# Patient Record
Sex: Female | Born: 1940
Health system: Southern US, Community
[De-identification: ages and names within clinical notes are randomized; demographics above are authoritative.]

## PROBLEM LIST (undated history)

## (undated) DIAGNOSIS — M545 Low back pain, unspecified: Secondary | ICD-10-CM

## (undated) DIAGNOSIS — I2510 Atherosclerotic heart disease of native coronary artery without angina pectoris: Secondary | ICD-10-CM

## (undated) DIAGNOSIS — F329 Major depressive disorder, single episode, unspecified: Secondary | ICD-10-CM

## (undated) DIAGNOSIS — D509 Iron deficiency anemia, unspecified: Secondary | ICD-10-CM

## (undated) DIAGNOSIS — M1990 Unspecified osteoarthritis, unspecified site: Secondary | ICD-10-CM

## (undated) DIAGNOSIS — G8929 Other chronic pain: Secondary | ICD-10-CM

## (undated) DIAGNOSIS — M8580 Other specified disorders of bone density and structure, unspecified site: Secondary | ICD-10-CM

## (undated) DIAGNOSIS — L719 Rosacea, unspecified: Secondary | ICD-10-CM

## (undated) DIAGNOSIS — I4891 Unspecified atrial fibrillation: Secondary | ICD-10-CM

## (undated) DIAGNOSIS — Z9889 Other specified postprocedural states: Secondary | ICD-10-CM

## (undated) DIAGNOSIS — E669 Obesity, unspecified: Secondary | ICD-10-CM

## (undated) DIAGNOSIS — K219 Gastro-esophageal reflux disease without esophagitis: Secondary | ICD-10-CM

## (undated) DIAGNOSIS — E119 Type 2 diabetes mellitus without complications: Secondary | ICD-10-CM

## (undated) DIAGNOSIS — I214 Non-ST elevation (NSTEMI) myocardial infarction: Secondary | ICD-10-CM

## (undated) DIAGNOSIS — K760 Fatty (change of) liver, not elsewhere classified: Secondary | ICD-10-CM

## (undated) DIAGNOSIS — I509 Heart failure, unspecified: Secondary | ICD-10-CM

## (undated) DIAGNOSIS — F32A Depression, unspecified: Secondary | ICD-10-CM

## (undated) DIAGNOSIS — I428 Other cardiomyopathies: Secondary | ICD-10-CM

## (undated) DIAGNOSIS — I10 Essential (primary) hypertension: Secondary | ICD-10-CM

## (undated) DIAGNOSIS — I340 Nonrheumatic mitral (valve) insufficiency: Secondary | ICD-10-CM

## (undated) DIAGNOSIS — I5022 Chronic systolic (congestive) heart failure: Secondary | ICD-10-CM

## (undated) DIAGNOSIS — R112 Nausea with vomiting, unspecified: Secondary | ICD-10-CM

## (undated) HISTORY — DX: Chronic systolic (congestive) heart failure: I50.22

## (undated) HISTORY — PX: CHOLECYSTECTOMY: SHX55

## (undated) HISTORY — PX: BREAST BIOPSY: SHX20

## (undated) HISTORY — DX: Obesity, unspecified: E66.9

## (undated) HISTORY — DX: Low back pain: M54.5

## (undated) HISTORY — PX: TOTAL ABDOMINAL HYSTERECTOMY W/ BILATERAL SALPINGOOPHORECTOMY: SHX83

## (undated) HISTORY — DX: Depression, unspecified: F32.A

## (undated) HISTORY — DX: Major depressive disorder, single episode, unspecified: F32.9

## (undated) HISTORY — DX: Nonrheumatic mitral (valve) insufficiency: I34.0

## (undated) HISTORY — PX: ABDOMINAL HYSTERECTOMY: SHX81

## (undated) HISTORY — PX: CATARACT EXTRACTION: SUR2

## (undated) HISTORY — DX: Atherosclerotic heart disease of native coronary artery without angina pectoris: I25.10

## (undated) HISTORY — DX: Other chronic pain: G89.29

## (undated) HISTORY — DX: Rosacea, unspecified: L71.9

## (undated) HISTORY — DX: Type 2 diabetes mellitus without complications: E11.9

## (undated) HISTORY — DX: Low back pain, unspecified: M54.50

## (undated) HISTORY — DX: Gastro-esophageal reflux disease without esophagitis: K21.9

## (undated) HISTORY — DX: Fatty (change of) liver, not elsewhere classified: K76.0

## (undated) HISTORY — PX: ROTATOR CUFF REPAIR: SHX139

## (undated) HISTORY — DX: Other specified disorders of bone density and structure, unspecified site: M85.80

## (undated) HISTORY — PX: TONSILLECTOMY: SUR1361

## (undated) HISTORY — DX: Unspecified atrial fibrillation: I48.91

## (undated) HISTORY — DX: Essential (primary) hypertension: I10

## (undated) HISTORY — PX: APPENDECTOMY: SHX54

---

## 2008-04-12 HISTORY — PX: CARDIAC CATHETERIZATION: SHX172

## 2008-06-06 ENCOUNTER — Ambulatory Visit: Payer: Self-pay | Admitting: Cardiology

## 2008-06-10 ENCOUNTER — Inpatient Hospital Stay (HOSPITAL_COMMUNITY): Admission: AD | Admit: 2008-06-10 | Discharge: 2008-06-18 | Payer: Self-pay | Admitting: Cardiology

## 2008-06-10 ENCOUNTER — Ambulatory Visit: Payer: Self-pay | Admitting: Cardiovascular Disease

## 2008-06-11 ENCOUNTER — Encounter: Payer: Self-pay | Admitting: Cardiology

## 2008-06-13 ENCOUNTER — Encounter: Payer: Self-pay | Admitting: Cardiology

## 2008-06-14 ENCOUNTER — Encounter: Payer: Self-pay | Admitting: Internal Medicine

## 2008-06-19 ENCOUNTER — Ambulatory Visit: Payer: Self-pay | Admitting: Cardiology

## 2008-06-25 ENCOUNTER — Encounter: Payer: Self-pay | Admitting: Cardiology

## 2008-06-26 ENCOUNTER — Encounter: Payer: Self-pay | Admitting: Cardiology

## 2008-06-28 ENCOUNTER — Encounter: Payer: Self-pay | Admitting: Cardiology

## 2008-07-04 ENCOUNTER — Ambulatory Visit: Payer: Self-pay | Admitting: Cardiology

## 2008-07-11 ENCOUNTER — Ambulatory Visit: Payer: Self-pay | Admitting: Cardiology

## 2008-07-11 ENCOUNTER — Encounter: Payer: Self-pay | Admitting: Cardiology

## 2008-07-19 ENCOUNTER — Ambulatory Visit: Payer: Self-pay | Admitting: Cardiology

## 2008-07-26 ENCOUNTER — Ambulatory Visit: Payer: Self-pay | Admitting: Cardiology

## 2008-08-01 ENCOUNTER — Ambulatory Visit: Payer: Self-pay | Admitting: Cardiology

## 2008-08-05 ENCOUNTER — Ambulatory Visit: Payer: Self-pay | Admitting: Cardiology

## 2008-08-08 ENCOUNTER — Encounter: Payer: Self-pay | Admitting: Cardiology

## 2008-08-13 ENCOUNTER — Ambulatory Visit: Payer: Self-pay | Admitting: Cardiology

## 2008-08-27 ENCOUNTER — Ambulatory Visit: Payer: Self-pay | Admitting: Cardiology

## 2008-09-16 ENCOUNTER — Encounter: Payer: Self-pay | Admitting: Cardiology

## 2008-09-20 ENCOUNTER — Ambulatory Visit: Payer: Self-pay | Admitting: Cardiology

## 2008-09-23 ENCOUNTER — Encounter: Payer: Self-pay | Admitting: Cardiology

## 2008-09-27 ENCOUNTER — Encounter: Payer: Self-pay | Admitting: Cardiology

## 2008-11-25 ENCOUNTER — Encounter: Payer: Self-pay | Admitting: *Deleted

## 2008-11-29 ENCOUNTER — Ambulatory Visit: Payer: Self-pay | Admitting: Cardiology

## 2008-12-10 ENCOUNTER — Ambulatory Visit: Payer: Self-pay | Admitting: Cardiology

## 2008-12-18 ENCOUNTER — Encounter: Payer: Self-pay | Admitting: Cardiology

## 2009-01-10 ENCOUNTER — Ambulatory Visit: Payer: Self-pay | Admitting: Cardiology

## 2009-01-10 LAB — CONVERTED CEMR LAB: POC INR: 1.4

## 2009-01-24 ENCOUNTER — Ambulatory Visit: Payer: Self-pay | Admitting: Cardiology

## 2009-01-24 LAB — CONVERTED CEMR LAB: POC INR: 2.8

## 2009-02-14 ENCOUNTER — Ambulatory Visit: Payer: Self-pay | Admitting: Cardiology

## 2009-02-14 LAB — CONVERTED CEMR LAB: POC INR: 2.7

## 2009-02-26 ENCOUNTER — Telehealth (INDEPENDENT_AMBULATORY_CARE_PROVIDER_SITE_OTHER): Payer: Self-pay | Admitting: *Deleted

## 2009-03-27 ENCOUNTER — Encounter (INDEPENDENT_AMBULATORY_CARE_PROVIDER_SITE_OTHER): Payer: Self-pay | Admitting: Cardiology

## 2009-04-18 ENCOUNTER — Ambulatory Visit: Payer: Self-pay | Admitting: Cardiology

## 2009-04-18 LAB — CONVERTED CEMR LAB: POC INR: 2.9

## 2009-05-09 ENCOUNTER — Ambulatory Visit: Payer: Self-pay | Admitting: Cardiology

## 2009-05-09 LAB — CONVERTED CEMR LAB: POC INR: 2.1

## 2009-06-06 ENCOUNTER — Encounter: Payer: Self-pay | Admitting: Cardiology

## 2009-06-06 DIAGNOSIS — I509 Heart failure, unspecified: Secondary | ICD-10-CM | POA: Insufficient documentation

## 2009-06-06 DIAGNOSIS — I200 Unstable angina: Secondary | ICD-10-CM

## 2009-06-06 DIAGNOSIS — E876 Hypokalemia: Secondary | ICD-10-CM | POA: Insufficient documentation

## 2009-06-06 DIAGNOSIS — R079 Chest pain, unspecified: Secondary | ICD-10-CM | POA: Insufficient documentation

## 2009-06-06 DIAGNOSIS — K219 Gastro-esophageal reflux disease without esophagitis: Secondary | ICD-10-CM

## 2009-06-06 DIAGNOSIS — I5022 Chronic systolic (congestive) heart failure: Secondary | ICD-10-CM

## 2009-06-06 DIAGNOSIS — I2510 Atherosclerotic heart disease of native coronary artery without angina pectoris: Secondary | ICD-10-CM

## 2009-06-06 DIAGNOSIS — E871 Hypo-osmolality and hyponatremia: Secondary | ICD-10-CM

## 2009-06-06 DIAGNOSIS — E785 Hyperlipidemia, unspecified: Secondary | ICD-10-CM | POA: Insufficient documentation

## 2009-06-06 DIAGNOSIS — E1165 Type 2 diabetes mellitus with hyperglycemia: Secondary | ICD-10-CM

## 2009-06-06 DIAGNOSIS — I10 Essential (primary) hypertension: Secondary | ICD-10-CM | POA: Insufficient documentation

## 2009-06-06 DIAGNOSIS — E1169 Type 2 diabetes mellitus with other specified complication: Secondary | ICD-10-CM

## 2009-06-06 DIAGNOSIS — G8929 Other chronic pain: Secondary | ICD-10-CM

## 2009-07-04 ENCOUNTER — Ambulatory Visit: Payer: Self-pay | Admitting: Cardiology

## 2009-08-07 ENCOUNTER — Encounter (INDEPENDENT_AMBULATORY_CARE_PROVIDER_SITE_OTHER): Payer: Self-pay | Admitting: Pharmacist

## 2009-08-11 ENCOUNTER — Encounter: Payer: Self-pay | Admitting: Cardiology

## 2009-09-03 ENCOUNTER — Encounter (INDEPENDENT_AMBULATORY_CARE_PROVIDER_SITE_OTHER): Payer: Self-pay | Admitting: Pharmacist

## 2009-09-22 ENCOUNTER — Telehealth (INDEPENDENT_AMBULATORY_CARE_PROVIDER_SITE_OTHER): Payer: Self-pay | Admitting: *Deleted

## 2009-09-23 ENCOUNTER — Ambulatory Visit: Payer: Self-pay | Admitting: Cardiology

## 2009-10-14 ENCOUNTER — Ambulatory Visit: Payer: Self-pay | Admitting: Cardiology

## 2009-10-28 ENCOUNTER — Ambulatory Visit: Payer: Self-pay | Admitting: Cardiology

## 2009-10-28 DIAGNOSIS — I2510 Atherosclerotic heart disease of native coronary artery without angina pectoris: Secondary | ICD-10-CM | POA: Insufficient documentation

## 2009-10-28 DIAGNOSIS — I428 Other cardiomyopathies: Secondary | ICD-10-CM

## 2009-10-28 DIAGNOSIS — I4891 Unspecified atrial fibrillation: Secondary | ICD-10-CM | POA: Insufficient documentation

## 2009-10-28 DIAGNOSIS — I447 Left bundle-branch block, unspecified: Secondary | ICD-10-CM | POA: Insufficient documentation

## 2009-11-12 ENCOUNTER — Ambulatory Visit: Payer: Self-pay | Admitting: Cardiology

## 2009-11-20 ENCOUNTER — Encounter (INDEPENDENT_AMBULATORY_CARE_PROVIDER_SITE_OTHER): Payer: Self-pay | Admitting: *Deleted

## 2009-11-26 ENCOUNTER — Encounter (INDEPENDENT_AMBULATORY_CARE_PROVIDER_SITE_OTHER): Payer: Self-pay | Admitting: Pharmacist

## 2009-12-24 ENCOUNTER — Encounter (INDEPENDENT_AMBULATORY_CARE_PROVIDER_SITE_OTHER): Payer: Self-pay | Admitting: Pharmacist

## 2010-01-06 ENCOUNTER — Ambulatory Visit: Payer: Self-pay | Admitting: Cardiology

## 2010-02-03 ENCOUNTER — Ambulatory Visit: Payer: Self-pay | Admitting: Cardiology

## 2010-03-25 ENCOUNTER — Telehealth (INDEPENDENT_AMBULATORY_CARE_PROVIDER_SITE_OTHER): Payer: Self-pay | Admitting: *Deleted

## 2010-03-27 ENCOUNTER — Ambulatory Visit: Payer: Self-pay | Admitting: Cardiology

## 2010-03-27 LAB — CONVERTED CEMR LAB: POC INR: 1.8

## 2010-04-21 ENCOUNTER — Ambulatory Visit: Admission: RE | Admit: 2010-04-21 | Discharge: 2010-04-21 | Payer: Self-pay | Source: Home / Self Care

## 2010-04-21 LAB — CONVERTED CEMR LAB: POC INR: 2

## 2010-05-12 NOTE — Assessment & Plan Note (Signed)
Summary: f/u, one year pass due LA   Visit Type:  Follow-up Primary Provider:  Dr. Donzetta Sprung   History of Present Illness: the patient is a 70 year old female with history of IBS and depression. She has a history of nonischemic cardiomyopathy. Ejection fraction a year ago was 30-35%. She also has a history of permanent atrial fibrillation and had a prior unsuccessful cardioversion. She also has a history of left atrial thrombus by TEE and remains on Coumadin. She is functional class NYHA 2. Her depression is much improved. She does not report any heart failure symptoms. She has some lower showed edema towards the end of the day. She feels that her exercise tolerance has remained stable. Of note is that the patient has a chronic left bundle branch block with a QRS width of 156 ms.  Preventive Screening-Counseling & Management  Alcohol-Tobacco     Smoking Status: never  Current Medications (verified): 1)  Carvedilol 25 Mg Tabs (Carvedilol) .... Take 1 1/2 Tabs (37.5mg ) Twice A Day 2)  Furosemide 40 Mg Tabs (Furosemide) .... Take One Tablet By Mouth Three Times A Day 3)  Nitrostat 0.4 Mg Subl (Nitroglycerin) .... Dissolve One Tablet Under Tongue As Needed For Severe Chest Pain Every 5 Minutes, Not To Exceed 3 in 15 Min Time Frame 4)  Warfarin Sodium 5 Mg Tabs (Warfarin Sodium) .... Use As Directed By Anticoagulation Clinic 5)  Digoxin 0.125 Mg Tabs (Digoxin) .... Take 1 Tablet By Mouth Once A Day 6)  Lisinopril 10 Mg Tabs (Lisinopril) .... Take 1 Tablet By Mouth Two Times A Day 7)  Effexor Xr 150 Mg Xr24h-Cap (Venlafaxine Hcl) .... Take 1 Capsule By Mouth Once A Day 8)  Protonix 20 Mg Tbec (Pantoprazole Sodium) .... Take 1 Tablet By Mouth Once A Day 9)  Calcium Carbonate 600 Mg Tabs (Calcium Carbonate) .... Take 1 Tablet By Mouth Two Times A Day 10)  Lantus 100 Unit/ml Soln (Insulin Glargine) .... Use As Directed 11)  Crestor 10 Mg Tabs (Rosuvastatin Calcium) .... Take 1 Tab By Mouth  At Bedtime 12)  Doxycycline Hyclate 100 Mg Tabs (Doxycycline Hyclate) .... As Needed 13)  Aspirin 81 Mg Tbec (Aspirin) .... Take One Tablet By Mouth Daily  Allergies: 1)  ! Codeine  Comments:  Nurse/Medical Assistant: The patient's medications were reviewed with the patient and were updated in the Medication List. Pt brought a list of medications to office visit.  Cyril Loosen, RN, BSN (October 28, 2009 4:14 PM)  Past History:  Past Surgical History: Last updated: 2009-07-01 Abdominal Hysterectomy-Total and bilateral salpingo oophorectomy for a dermoid tumor Appendectomy Cataract Extraction Cholecystectomy Tonsillectomy Rotator cuff repair rt. shoulder Rt. 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 Vit D Insufficiency Non alcoholic fatty liver disease Diabetes Type 2 allerigic Rhinitis Hypertension Chronic Systolic Heart Failure GERD Nonobstructive CAD Osteopenia Depression Rosacea  Social History: Smoking Status:  never  Vital Signs:  Patient profile:   70 year old female Height:      67 inches Weight:      252.50 pounds BMI:     39.69 Pulse rate:  79 / minute BP sitting:   138 / 71  (left arm) Cuff size:   large  Vitals Entered By: Cyril Loosen, RN, BSN (October 28, 2009 4:04 PM)  Nutrition Counseling: Patient's BMI is greater than 25 and therefore counseled on weight management options. Comments follow up visit   Physical Exam  Additional Exam:  General: Well-developed, well-nourished in no distress head:  Normocephalic and atraumatic eyes PERRLA/EOMI intact, conjunctiva and lids normal nose: No deformity or lesions mouth normal dentition, normal posterior pharynx neck: Supple, no JVD.  No masses, thyromegaly or abnormal cervical nodes lungs: Normal breath sounds bilaterally without wheezing.  Normal percussion heart:Irregular rate and rhythm with normal S1 and S2, no S3 or S4.  PMI is normal.  No pathological murmurs abdomen: Normal bowel sounds, abdomen is soft and nontender without masses, organomegaly or hernias noted.  No hepatosplenomegaly musculoskeletal: Back normal, normal gait muscle strength and tone normal pulsus: Pulse is normal in all 4 extremities Extremities: No peripheral pitting edema neurologic: Alert and oriented x 3 skin: Intact without lesions or rashes cervical nodes: No significant adenopathy psychologic: Normal affect    EKG  Procedure date:  10/28/2009  Findings:      atrial fibrillation.left bundle branch block. Heart rate 71 beats per minute.  Impression & Recommendations:  Problem # 1:  ATRIAL FIBRILLATION (ICD-427.31) the patient will continue with the strategy of rate control. She is relatively asymptomatic from perspective of atrial fibrillation. She does have some fatigue and weakness and shortness of breath on moderate exertion but I suspect this is related to her LV dysfunction. Her updated medication list for this problem includes:    Carvedilol 25 Mg Tabs (Carvedilol) .Marland Kitchen... Take 1 1/2 tabs (37.5mg ) twice a day    Warfarin Sodium 5 Mg Tabs (Warfarin sodium) ..... Use as directed by anticoagulation clinic    Digoxin 0.125 Mg Tabs (Digoxin) .Marland Kitchen... Take 1 tablet by mouth once a day    Aspirin 81 Mg Tbec (Aspirin) .Marland Kitchen... Take one tablet by mouth daily  Orders: 2-D Echocardiogram (2D Echo)  Problem # 2:  LEFT VENTRICULAR FUNCTION, DECREASED (ICD-429.2) repeat echocardiogram and consider CRT-D ejection fraction less than 35%. The patient has a QRS of  156 ms unfortunately she is in atrial fibrillation which may make the predictive value of CRT success smaller.  Problem # 3:  LEFT BUNDLE BRANCH BLOCK (ICD-426.3) Assessment: Comment Only  The following medications were removed from the medication list:    Lovenox 120 Mg/0.32ml Soln (Enoxaparin sodium) ..... Inject 1 syringe subcutaneously once a day Her updated medication list for this problem includes:    Carvedilol 25 Mg Tabs (Carvedilol) .Marland Kitchen... Take 1 1/2 tabs (37.5mg ) twice a day    Nitrostat 0.4 Mg Subl (Nitroglycerin) .Marland Kitchen... Dissolve one tablet under tongue as needed for severe chest pain every 5 minutes, not to exceed 3 in 15 min time frame    Warfarin Sodium 5 Mg Tabs (Warfarin sodium) ..... Use as directed by anticoagulation clinic    Lisinopril 10 Mg Tabs (Lisinopril) .Marland Kitchen... Take 1 tablet by mouth two times a day    Aspirin 81 Mg Tbec (Aspirin) .Marland Kitchen... Take one tablet by mouth daily  Problem # 4:  CARDIOMYOPATHY, DILATED (ICD-425.4) Assessment: Comment Only  The following medications were removed from the medication list:    Lovenox 120 Mg/0.86ml Soln (Enoxaparin sodium) ..... Inject 1 syringe subcutaneously once a day Her updated medication list for this problem includes:    Carvedilol 25 Mg Tabs (Carvedilol) .Marland KitchenMarland KitchenMarland KitchenMarland Kitchen  Take 1 1/2 tabs (37.5mg ) twice a day    Furosemide 40 Mg Tabs (Furosemide) .Marland Kitchen... Take one tablet by mouth three times a day    Nitrostat 0.4 Mg Subl (Nitroglycerin) .Marland Kitchen... Dissolve one tablet under tongue as needed for severe chest pain every 5 minutes, not to exceed 3 in 15 min time frame    Warfarin Sodium 5 Mg Tabs (Warfarin sodium) ..... Use as directed by anticoagulation clinic    Digoxin 0.125 Mg Tabs (Digoxin) .Marland Kitchen... Take 1 tablet by mouth once a day    Lisinopril 10 Mg Tabs (Lisinopril) .Marland Kitchen... Take 1 tablet by mouth two times a day    Aspirin 81 Mg Tbec (Aspirin) .Marland Kitchen... Take one tablet by mouth daily  Other Orders: EKG w/ Interpretation (93000)  Patient  Instructions: 1)  2D Echo  2)  Follow up in  6 months

## 2010-05-12 NOTE — Letter (Signed)
Summary: Custom - Delinquent Coumadin 2  [facility]  [address] [place] [address]   Phone: [phone]  Fax: [phone]     December 24, 2009 MRN: [id number]   Victoria Adams [address] [place], [place]  [address]   Dear Ms. Alexopoulos,  We have attempted to contact you by phone and letter on multiple occasions to contact our office for important blood work associated with the blood thinner, warfarin (Coumadin).  Warfarin is a very important drug that can cause life threatening side effects including, bleeding, and thus requires close laboratory monitoring.  We are unable to accept responsibility for blood thinner-related health problems you may develop because you have not followed our recommendations for appropriate monitoring.  These may include abnormal bleeding occurrences and/or development of blood clots (stroke, heart attack, blood clots in legs or lungs, etc.).  We need for you to contact this office at the number listed above to schedule and complete this very important blood work.  Thank you for your assistance in this urgent matter.  Sincerely, Vashti Hey RN [facility] Team

## 2010-05-12 NOTE — Letter (Signed)
Summary: Custom - Delinquent Coumadin 1  [facility]  [address]   [place], [place] [address]   Phone: [phone]  Fax: [phone]     November 26, 2009 MRN: [id number]   Victoria Adams [address] [place], [place]  [address]   Dear Ms. Lehenbauer,  This letter is being sent to you as a reminder that it is necessary for you to get your INR/PT checked regularly so that we can optimize your care.  Our records indicate that you were scheduled to have a test done recently.  As of today, we have not received the results of this test.  It is very important that you have your INR checked.  Please call our office at the number listed above to schedule an appointment at your earliest convenience.    If you have recently had your protime checked or have discontinued this medication, please contact our office at the above phone number to clarify this issue.  Thank you for this prompt attention to this important health care matter.  Sincerely, Nikhita Mentzel Reid RN  [facility] Team   

## 2010-05-12 NOTE — Medication Information (Signed)
Summary: ccr-lr  Anticoagulant Therapy  Managed by: Vashti Hey, RN Supervising MD: Myrtis Ser MD, Tinnie Gens Indication 1: Atrial Fibrillation (ICD-427.31) [facility] Site: [place] INR POC 2.1  Dietary changes: no    Health status changes: no    Bleeding/hemorrhagic complications: no    Recent/future hospitalizations: no    Any changes in medication regimen? no    Recent/future dental: no  Any missed doses?: no       Is patient compliant with meds? yes       Anticoagulation Management History:      The patient is taking warfarin and comes in today for a routine follow up visit.  Positive risk factors for bleeding include an age of 68 years or older.  The bleeding index is 'intermediate risk'.  Negative CHADS2 values include Age > 35 years old.  The start date was 06/16/2008.  Anticoagulation responsible provider: Myrtis Ser MD, Tinnie Gens.  INR POC: 2.1.  Cuvette Lot#: [id number].  Exp: 10/11.    Anticoagulation Management Assessment/Plan:      The patient's current anticoagulation dose is Warfarin sodium 5 mg tabs: Use as directed by Anticoagulation Clinic.  The target INR is 2 - 3.  The next INR is due 06/06/2009.  Anticoagulation instructions were given to patient.  Results were reviewed/authorized by Vashti Hey, RN.  She was notified by Vashti Hey RN.         Prior Anticoagulation Instructions: INR 2.9 Continue coumadin 5mg  once daily except 7.5mg  on Sundays and Thursdays Doxycycline increased to 2 x day  Current Anticoagulation Instructions: INR 2.1 Continue coumadin 5mg  once daily except 7.5mg  on Sundays and Thursdays

## 2010-05-12 NOTE — Medication Information (Signed)
Summary: missed last appt/LA  Anticoagulant Therapy  Managed by: Vashti Hey, RN Supervising MD: Andee Lineman MD, Michelle Piper Indication 1: Atrial Fibrillation (ICD-427.31) Lab [facility]: [facility] Site: [place] INR POC 1.8  Dietary changes: no    Health status changes: no    Bleeding/hemorrhagic complications: no    Recent/future hospitalizations: no    Any changes in medication regimen? no    Recent/future dental: no  Any missed doses?: no       Is patient compliant with meds? yes       Allergies: 1)  ! Codeine  Anticoagulation Management History:      The patient is taking warfarin and comes in today for a routine follow up visit.  Positive risk factors for bleeding include an age of 41 years or older and presence of serious comorbidities.  The bleeding index is 'intermediate risk'.  Positive CHADS2 values include History of CHF, History of HTN, and History of Diabetes.  Negative CHADS2 values include Age > 3 years old.  The start date was 06/16/2008.  Anticoagulation responsible provider: Andee Lineman MD, Michelle Piper.  INR POC: 1.8.  Cuvette Lot#: [id number].  Exp: 10/11.    Anticoagulation Management Assessment/Plan:      The patient's current anticoagulation dose is Warfarin sodium 5 mg tabs: Use as directed by Anticoagulation Clinic.  The target INR is 2 - 3.  The next INR is due 07/25/2009.  Anticoagulation instructions were given to patient.  Results were reviewed/authorized by Vashti Hey, RN.  She was notified by Vashti Hey RN.         Prior Anticoagulation Instructions: INR 2.1 Continue coumadin 5mg  once daily except 7.5mg  on Sundays and Thursdays  Current Anticoagulation Instructions: INR 1.8 Take coumadin 10mg  tonight then increase dose to 5mg  once daily except 7.5mg  on S,T,Th

## 2010-05-12 NOTE — Letter (Signed)
Summary: [facility], [facility] at [facility]  [address]   [place], [place] [address]   Phone: [phone]  Fax: [phone]        November 20, 2009 MRN: [id number]   Victoria Adams [address] [place], [place]  [address]   Dear Ms. Shepard,  Your test ordered by Selena Batten has been reviewed by your physician (or physician assistant) and was found to be normal or stable. Your physician (or physician assistant) felt no changes were needed at this time.  __X__ Echocardiogram  ____ Cardiac Stress Test  ____ Lab Work  ____ Peripheral vascular study of arms, legs or neck  ____ CT scan or X-ray  ____ Lung or Breathing test  ____ Other:   Thank you.   Hoover Brunette, LPN    Duane Boston, M.D., F.A.C.C. Thressa Sheller, M.D., F.A.C.C. Oneal Grout, M.D., F.A.C.C. Cheree Ditto, M.D., F.A.C.C. Daiva Nakayama, M.D., F.A.C.C. Kenney Houseman, M.D., F.A.C.C. Jeanne Ivan, PA-C

## 2010-05-12 NOTE — Progress Notes (Signed)
Summary: DELINQUENT APPT NOTIFICATION  Phone Note Outgoing Call Call back at [facility] Phone [phone]   Call placed by: Carlye Grippe,  September 22, 2009 3:53 PM Call placed to: Patient Summary of Call: called patient informing her that she was past due for f/u and she needed to keep the scheduled appt with Degent this time since she no showed for last appt, in order to keep getting refills on prescriptions. Initial call taken by: Carlye Grippe,  September 22, 2009 3:55 PM

## 2010-05-12 NOTE — Letter (Signed)
Summary: Appointment -missed   [facility] [address]   [place], [place] [address]   Phone: [phone]  Fax: [phone]     Aug 11, 2009 MRN: [id number]     Victoria Adams [address] [place], [place]  [address]      Dear Ms. Hannum,  Our records indicate you missed your appointment on Aug 11, 2009                        with Dr.  Andee Lineman.   It is very important that we reach you to reschedule this appointment. We look forward to participating in your health care needs.   Please contact us at the number listed above at your earliest convenience to reschedule this appointment.   Sincerely,    Glass blower/designer

## 2010-05-12 NOTE — Letter (Signed)
Summary: Custom - Delinquent Coumadin 1  [facility]  [address], [place] [address]   Phone: [phone]  Fax: [phone]     November 26, 2009 MRN: [id number]   Victoria Adams [address] [place], [place]  [address]   Dear Ms. Smet,  This letter is being sent to you as a reminder that it is necessary for you to get your INR/PT checked regularly so that we can optimize your care.  Our records indicate that you were scheduled to have a test done recently.  As of today, we have not received the results of this test.  It is very important that you have your INR checked.  Please call our office at the number listed above to schedule an appointment at your earliest convenience.    If you have recently had your protime checked or have discontinued this medication, please contact our office at the above phone number to clarify this issue.  Thank you for this prompt attention to this important health care matter.  Sincerely, Vashti Hey RN  [facility] Team

## 2010-05-12 NOTE — Medication Information (Signed)
Summary: CCR  Anticoagulant Therapy  Managed by: Vashti Hey, RN PCP: Dr. Donzetta Sprung Supervising MD: Andee Lineman MD, Michelle Piper Indication 1: Atrial Fibrillation (ICD-427.31) Lab [facility]: [facility] Site: [place] INR POC 2.1  Dietary changes: no    Health status changes: no    Bleeding/hemorrhagic complications: no    Recent/future hospitalizations: no    Any changes in medication regimen? no    Recent/future dental: no  Any missed doses?: no       Is patient compliant with meds? yes       Allergies: 1)  ! Codeine  Anticoagulation Management History:      The patient is taking warfarin and comes in today for a routine follow up visit.  Positive risk factors for bleeding include an age of 70 years or older and presence of serious comorbidities.  The bleeding index is 'intermediate risk'.  Positive CHADS2 values include History of CHF, History of HTN, and History of Diabetes.  Negative CHADS2 values include Age > 20 years old.  The start date was 06/16/2008.  Anticoagulation responsible provider: Andee Lineman MD, Michelle Piper.  INR POC: 2.1.  Cuvette Lot#: [id number].  Exp: 10/11.    Anticoagulation Management Assessment/Plan:      The patient's current anticoagulation dose is Warfarin sodium 5 mg tabs: Use as directed by Anticoagulation Clinic.  The target INR is 2 - 3.  The next INR is due 02/03/2010.  Anticoagulation instructions were given to patient.  Results were reviewed/authorized by Vashti Hey, RN.  She was notified by Vashti Hey RN.         Prior Anticoagulation Instructions: INR 2.1 Continue coumadin 5mg  once daily except 7.5mg  on Sundays and Thursdays  Current Anticoagulation Instructions: Same as Prior Instructions.

## 2010-05-12 NOTE — Medication Information (Signed)
Summary: ccr  Anticoagulant Therapy  Managed by: Vashti Hey, RN Supervising MD: Andee Lineman MD, Michelle Piper Indication 1: Atrial Fibrillation (ICD-427.31) Lab [facility]: [facility] Site: [place] INR POC 4.2  Dietary changes: no    Health status changes: no    Bleeding/hemorrhagic complications: no    Recent/future hospitalizations: no    Any changes in medication regimen? no    Recent/future dental: no  Any missed doses?: no       Is patient compliant with meds? yes       Allergies: 1)  ! Codeine  Anticoagulation Management History:      The patient is taking warfarin and comes in today for a routine follow up visit.  Positive risk factors for bleeding include an age of 70 years or older and presence of serious comorbidities.  The bleeding index is 'intermediate risk'.  Positive CHADS2 values include History of CHF, History of HTN, and History of Diabetes.  Negative CHADS2 values include Age > 4 years old.  The start date was 06/16/2008.  Anticoagulation responsible provider: Andee Lineman MD, Michelle Piper.  INR POC: 4.2.  Cuvette Lot#: [id number].  Exp: 10/11.    Anticoagulation Management Assessment/Plan:      The patient's current anticoagulation dose is Warfarin sodium 5 mg tabs: Use as directed by Anticoagulation Clinic.  The target INR is 2 - 3.  The next INR is due 10/14/2009.  Anticoagulation instructions were given to patient.  Results were reviewed/authorized by Vashti Hey, RN.  She was notified by Vashti Hey RN.         Prior Anticoagulation Instructions: INR 1.8 Take coumadin 10mg  tonight then increase dose to 5mg  once daily except 7.5mg  on S,T,Th  Current Anticoagulation Instructions: INR 4.2 Hold coumadin tonight then decrease dose to 5mg  once daily except 7.5mg  on Sundays and Thursdays

## 2010-05-12 NOTE — Medication Information (Signed)
Summary: ccr-lr  Anticoagulant Therapy  Managed by: Vashti Hey, RN Supervising MD: Antoine Poche MD, Fayrene Fearing Indication 1: Atrial Fibrillation (ICD-427.31) Lab [facility]: [facility] Site: [place] INR POC 2.1  Dietary changes: no    Health status changes: no    Bleeding/hemorrhagic complications: no    Recent/future hospitalizations: no    Any changes in medication regimen? no    Recent/future dental: no  Any missed doses?: no       Is patient compliant with meds? yes       Allergies: 1)  ! Codeine  Anticoagulation Management History:      The patient is taking warfarin and comes in today for a routine follow up visit.  Positive risk factors for bleeding include an age of 70 years or older and presence of serious comorbidities.  The bleeding index is 'intermediate risk'.  Positive CHADS2 values include History of CHF, History of HTN, and History of Diabetes.  Negative CHADS2 values include Age > 13 years old.  The start date was 06/16/2008.  Anticoagulation responsible provider: Antoine Poche MD, Fayrene Fearing.  INR POC: 2.1.  Cuvette Lot#: [id number].  Exp: 10/11.    Anticoagulation Management Assessment/Plan:      The patient's current anticoagulation dose is Warfarin sodium 5 mg tabs: Use as directed by Anticoagulation Clinic.  The target INR is 2 - 3.  The next INR is due 11/11/2009.  Anticoagulation instructions were given to patient.  Results were reviewed/authorized by Vashti Hey, RN.  She was notified by Vashti Hey RN.         Prior Anticoagulation Instructions: INR 4.2 Hold coumadin tonight then decrease dose to 5mg  once daily except 7.5mg  on Sundays and Thursdays  Current Anticoagulation Instructions: INR 2.1 Continue coumadin 5mg  once daily except 7.5mg  on Sundays and Thursdays

## 2010-05-12 NOTE — Letter (Signed)
Summary: Appointment -missed  [facility] [address]   [place], [place] [address]   Phone: [phone]  Fax: [phone]     June 06, 2009 MRN: [id number]     Victoria Adams [address] [place], [place]  [address]     Dear Ms. Lemon,  Our records indicate you missed your appointment on June 06, 2009                        with Dr.  Andee Lineman.   It is very important that we reach you to reschedule this appointment. We look forward to participating in your health care needs.   Please contact us at the number listed above at your earliest convenience to reschedule this appointment.   Sincerely,    Glass blower/designer

## 2010-05-12 NOTE — Medication Information (Signed)
Summary: CCR  Anticoagulant Therapy  Managed by: Vashti Hey, RN Supervising MD: Diona Browner MD, Remi Deter Indication 1: Atrial Fibrillation (ICD-427.31) [facility] Site: [place] INR POC 2.9  Dietary changes: no    Health status changes: no    Bleeding/hemorrhagic complications: no    Recent/future hospitalizations: no    Any changes in medication regimen? yes       Details: Doxycycline was increased to 2 x day for bumps on face  Recent/future dental: no  Any missed doses?: no       Is patient compliant with meds? yes       Anticoagulation Management History:      The patient is taking warfarin and comes in today for a routine follow up visit.  Positive risk factors for bleeding include an age of 68 years or older.  The bleeding index is 'intermediate risk'.  Negative CHADS2 values include Age > 68 years old.  The start date was 06/16/2008.  Anticoagulation responsible provider: Diona Browner MD, Remi Deter.  INR POC: 2.9.  Exp: 10/11.    Anticoagulation Management Assessment/Plan:      The patient's current anticoagulation dose is Warfarin sodium 5 mg tabs: Use as directed by Anticoagulation Clinic.  The target INR is 2 - 3.  The next INR is due 05/02/2009.  Anticoagulation instructions were given to patient.  Results were reviewed/authorized by Vashti Hey, RN.  She was notified by Vashti Hey RN.         Prior Anticoagulation Instructions: INR 2.7 today Continue coumadin 5mg  once daily except 7.5mg  on Sundays and Thursdays  Current Anticoagulation Instructions: INR 2.9 Continue coumadin 5mg  once daily except 7.5mg  on Sundays and Thursdays Doxycycline increased to 2 x day

## 2010-05-12 NOTE — Letter (Signed)
Summary: Custom - Delinquent Coumadin 1  [facility]  [address] [place] [address]   Phone: [phone]  Fax: [phone]     August 07, 2009 MRN: [id number]   Victoria Adams [address] [place], [place]  [address]   Dear Ms. Wimbish,  This letter is being sent to you as a reminder that it is necessary for you to get your INR/PT checked regularly so that we can optimize your care.  Our records indicate that you were scheduled to have a test done recently.  As of today, we have not received the results of this test.  It is very important that you have your INR checked.  Please call our office at the number listed above to schedule an appointment at your earliest convenience.    If you have recently had your protime checked or have discontinued this medication, please contact our office at the above phone number to clarify this issue.  Thank you for this prompt attention to this important health care matter.  Sincerely, Vashti Hey, RN  [facility] Team

## 2010-05-12 NOTE — Letter (Signed)
Summary: Custom - Delinquent Coumadin 2  [facility]  [address]   [place], [place] [address]   Phone: [phone]  Fax: [phone]     Sep 03, 2009 MRN: [id number]   Victoria Adams [address] [place], [place]  [address]   Dear Ms. Reisch,  We have attempted to contact you by phone and letter on multiple occasions to contact our office for important blood work associated with the blood thinner, warfarin (Coumadin).  Warfarin is a very important drug that can cause life threatening side effects including, bleeding, and thus requires close laboratory monitoring.  We are unable to accept responsibility for blood thinner-related health problems you may develop because you have not followed our recommendations for appropriate monitoring.  These may include abnormal bleeding occurrences and/or development of blood clots (stroke, heart attack, blood clots in legs or lungs, etc.).  We need for you to contact this office at the number listed above to schedule and complete this very important blood work.  Thank you for your assistance in this urgent matter.  Sincerely, Vashti Hey RN [facility] Team

## 2010-05-12 NOTE — Medication Information (Signed)
Summary: ccr-lr  Anticoagulant Therapy  Managed by: Victoria Hey, RN PCP: Dr. Donzetta Sprung Supervising MD: Diona Browner MD, Remi Deter Indication 1: Atrial Fibrillation (ICD-427.31) [facility]  Site: [facility] INR POC 2.5  Dietary changes: no    Health status changes: no    Bleeding/hemorrhagic complications: no    Recent/future hospitalizations: no    Any changes in medication regimen? no    Recent/future dental: no  Any missed doses?: no       Is patient compliant with meds? yes       Allergies: 1)  ! Codeine  Anticoagulation Management History:      The patient is taking warfarin and comes in today for a routine follow up visit.  Positive risk factors for bleeding include an age of 68 years or older and presence of serious comorbidities.  The bleeding index is 'intermediate risk'.  Positive CHADS2 values include History of CHF, History of HTN, and History of Diabetes.  Negative CHADS2 values include Age > 68 years old.  The start date was 06/16/2008.  Anticoagulation responsible Jola Critzer: Diona Browner MD, Remi Deter.  INR POC: 2.5.  Exp: 10/11.    Anticoagulation Management Assessment/Plan:      The patient's current anticoagulation dose is Warfarin sodium 5 mg tabs: Use as directed by Anticoagulation Clinic.  The target INR is 2 - 3.  The next INR is due 03/03/2010.  Anticoagulation instructions were given to patient.  Results were reviewed/authorized by Victoria Hey, RN.  She was notified by Victoria Hey RN.         Prior Anticoagulation Instructions: INR 2.1 Continue coumadin 5mg  once daily except 7.5mg  on Sundays and Thursdays  Current Anticoagulation Instructions: INR 2.5 Continue coumadin 5mg  once daily except 7.5mg  on Sundays and Thursdays

## 2010-05-14 NOTE — Medication Information (Signed)
Summary: ccr-lr  Anticoagulant Therapy  Managed by: Vashti Hey, RN PCP: Dr. Donzetta Sprung Supervising MD: Diona Browner MD, Remi Deter Indication 1: Atrial Fibrillation (ICD-427.31) Lab [facility] Site: [facility] INR POC 2.0  Dietary changes: no    Health status changes: no    Bleeding/hemorrhagic complications: no    Recent/future hospitalizations: no    Any changes in medication regimen? no    Recent/future dental: no  Any missed doses?: no       Is patient compliant with meds? yes       Allergies: 1)  ! Codeine  Anticoagulation Management History:      The patient is taking warfarin and comes in today for a routine follow up visit.  Positive risk factors for bleeding include an age of 70 years or older and presence of serious comorbidities.  The bleeding index is 'intermediate risk'.  Positive CHADS2 values include History of CHF, History of HTN, and History of Diabetes.  Negative CHADS2 values include Age > 97 years old.  The start date was 06/16/2008.  Anticoagulation responsible provider: Diona Browner MD, Remi Deter.  INR POC: 2.0.  Cuvette Lot#: [id number].  Exp: 10/11.    Anticoagulation Management Assessment/Plan:      The patient's current anticoagulation dose is Warfarin sodium 5 mg tabs: Use as directed by Anticoagulation Clinic.  The target INR is 2 - 3.  The next INR is due 05/22/2010.  Anticoagulation instructions were given to patient.  Results were reviewed/authorized by Vashti Hey, RN.  She was notified by Vashti Hey RN.         Prior Anticoagulation Instructions: INR 1.8 Take coumadin 2 tablets tonight then resume 1 tablet once daily except 1 1/2 tablets on Sundays and Thursdays  Current Anticoagulation Instructions: INR 2.0 Increase couamdin to 5mg  once daily except 7.5mg  on S,T,Th

## 2010-05-14 NOTE — Medication Information (Signed)
Summary: ccr-lr  Anticoagulant Therapy  Managed by: Victoria Hey, RN PCP: Victoria Adams Supervising MD: Andee Lineman MD, Michelle Piper Indication 1: Atrial Fibrillation (ICD-427.31) Lab [facility]: [facility] Site: [facility] INR POC 1.8  Dietary changes: no    Health status changes: no    Bleeding/hemorrhagic complications: no    Recent/future hospitalizations: no    Any changes in medication regimen? no    Recent/future dental: no  Any missed doses?: no       Is patient compliant with meds? yes       Allergies: 1)  ! Codeine  Anticoagulation Management History:      The patient is taking warfarin and comes in today for a routine follow up visit.  Positive risk factors for bleeding include an age of 70 years or older and presence of serious comorbidities.  The bleeding index is 'intermediate risk'.  Positive CHADS2 values include History of CHF, History of HTN, and History of Diabetes.  Negative CHADS2 values include Age > 41 years old.  The start date was 06/16/2008.  Anticoagulation responsible provider: Andee Lineman MD, Michelle Piper.  INR POC: 1.8.  Cuvette Lot#: [id number].  Exp: 10/11.    Anticoagulation Management Assessment/Plan:      The patient's current anticoagulation dose is Warfarin sodium 5 mg tabs: Use as directed by Anticoagulation Clinic.  The target INR is 2 - 3.  The next INR is due 04/21/2010.  Anticoagulation instructions were given to patient.  Results were reviewed/authorized by Victoria Hey, RN.  She was notified by Victoria Hey RN.         Prior Anticoagulation Instructions: INR 2.5 Continue coumadin 5mg  once daily except 7.5mg  on Sundays and Thursdays  Current Anticoagulation Instructions: INR 1.8 Take coumadin 2 tablets tonight then resume 1 tablet once daily except 1 1/2 tablets on Sundays and Thursdays

## 2010-05-14 NOTE — Progress Notes (Signed)
Summary: CCR NEEDED  Phone Note Call from Patient Call back at Home Phone [phone]   Caller: Patient Reason for Call: Talk to Nurse Summary of Call: PATIENT LEFT A MESSAGE TO SCHEDULE HER CCR.  SHE HAS NOT BEEN CHECKED SINCE 02/03/10. Initial call taken by: Claudette Laws,  March 25, 2010 3:28 PM  Follow-up for Phone Call        Pt called.  Appt made for 03/27/10 at 3:30pm. Follow-up by: Vashti Hey RN,  March 26, 2010 9:33 AM

## 2010-05-22 ENCOUNTER — Encounter: Payer: Self-pay | Admitting: Cardiology

## 2010-05-22 ENCOUNTER — Encounter (INDEPENDENT_AMBULATORY_CARE_PROVIDER_SITE_OTHER): Payer: Medicare Other

## 2010-05-22 DIAGNOSIS — I4891 Unspecified atrial fibrillation: Secondary | ICD-10-CM

## 2010-05-22 DIAGNOSIS — Z7901 Long term (current) use of anticoagulants: Secondary | ICD-10-CM

## 2010-05-22 LAB — CONVERTED CEMR LAB: POC INR: 2.3

## 2010-05-28 NOTE — Medication Information (Signed)
Summary: ccr-lr  Lab Visit  Orders Today:  Anticoagulant Therapy  Managed by: Vashti Hey, RN PCP: Dr. Donzetta Sprung Supervising MD: Diona Browner MD, Remi Deter Indication 1: Atrial Fibrillation (ICD-427.31) Lab [facility]: [facility] Site: [facility] INR POC 2.3  Dietary changes: no    Health status changes: no    Bleeding/hemorrhagic complications: no    Recent/future hospitalizations: no    Any changes in medication regimen? no    Recent/future dental: no  Any missed doses?: no       Is patient compliant with meds? yes         Anticoagulation Management History:      The patient is taking warfarin and comes in today for a routine follow up visit.  Positive risk factors for bleeding include an age of 70 years or older and presence of serious comorbidities.  The bleeding index is 'intermediate risk'.  Positive CHADS2 values include History of CHF, History of HTN, and History of Diabetes.  Negative CHADS2 values include Age > 68 years old.  The start date was 06/16/2008.  Anticoagulation responsible provider: Diona Browner MD, Remi Deter.  INR POC: 2.3.  Cuvette Lot#: [id number].  Exp: 10/11.    Anticoagulation Management Assessment/Plan:      The patient's current anticoagulation dose is Warfarin sodium 5 mg tabs: Use as directed by Anticoagulation Clinic.  The target INR is 2 - 3.  The next INR is due 06/19/2010.  Anticoagulation instructions were given to patient.  Results were reviewed/authorized by Vashti Hey, RN.  She was notified by Vashti Hey RN.         Prior Anticoagulation Instructions: INR 2.0 Increase couamdin to 5mg  once daily except 7.5mg  on S,T,Th  Current Anticoagulation Instructions: INR 2.3 Continue coumadin 5mg  once daily except 7.5mg  on S,T,Th

## 2010-06-01 ENCOUNTER — Encounter: Payer: Self-pay | Admitting: Cardiology

## 2010-06-18 NOTE — Letter (Signed)
Summary: External Correspondence/  OV DR. DANIEL  External Correspondence/  OV DR. DANIEL   Imported By: Dorise Hiss 06/12/2010 15:28:10  _____________________________________________________________________  External Attachment:    Type:   Image     Comment:   External Document

## 2010-06-19 ENCOUNTER — Encounter: Payer: Self-pay | Admitting: Cardiology

## 2010-06-19 ENCOUNTER — Encounter (INDEPENDENT_AMBULATORY_CARE_PROVIDER_SITE_OTHER): Payer: Medicare Other

## 2010-06-19 DIAGNOSIS — I4891 Unspecified atrial fibrillation: Secondary | ICD-10-CM

## 2010-06-19 DIAGNOSIS — Z7901 Long term (current) use of anticoagulants: Secondary | ICD-10-CM

## 2010-06-23 NOTE — Medication Information (Signed)
Summary: ccr-lr  Anticoagulant Therapy  Managed by: Vashti Hey, RN PCP: Dr. Donzetta Sprung Supervising MD: Andee Lineman MD, Michelle Piper Indication 1: Atrial Fibrillation (ICD-427.31) Lab [facility]: [facility] Site: [facility] INR POC 3.0  Dietary changes: no    Health status changes: no    Bleeding/hemorrhagic complications: no    Recent/future hospitalizations: no    Any changes in medication regimen? no    Recent/future dental: no  Any missed doses?: no       Is patient compliant with meds? yes       Allergies: 1)  ! Codeine  Anticoagulation Management History:      The patient is taking warfarin and comes in today for a routine follow up visit.  Positive risk factors for bleeding include an age of 67 years or older and presence of serious comorbidities.  The bleeding index is 'intermediate risk'.  Positive CHADS2 values include History of CHF, History of HTN, and History of Diabetes.  Negative CHADS2 values include Age > 38 years old.  The start date was 06/16/2008.  Anticoagulation responsible provider: Andee Lineman MD, Michelle Piper.  INR POC: 3.0.  Cuvette Lot#: [id number].  Exp: 10/11.    Anticoagulation Management Assessment/Plan:      The patient's current anticoagulation dose is Warfarin sodium 5 mg tabs: Use as directed by Anticoagulation Clinic.  The target INR is 2 - 3.  The next INR is due 07/21/2010.  Anticoagulation instructions were given to patient.  Results were reviewed/authorized by Vashti Hey, RN.  She was notified by Vashti Hey RN.         Prior Anticoagulation Instructions: INR 2.3 Continue coumadin 5mg  once daily except 7.5mg  on S,T,Th  Current Anticoagulation Instructions: INR 3.0 Continue coumadin 5mg  once daily except 7.5mg  on S,T,Th Increase greens/salads

## 2010-07-20 ENCOUNTER — Encounter: Payer: Self-pay | Admitting: Cardiology

## 2010-07-20 DIAGNOSIS — I4891 Unspecified atrial fibrillation: Secondary | ICD-10-CM

## 2010-07-20 DIAGNOSIS — Z7901 Long term (current) use of anticoagulants: Secondary | ICD-10-CM | POA: Insufficient documentation

## 2010-07-21 ENCOUNTER — Ambulatory Visit (INDEPENDENT_AMBULATORY_CARE_PROVIDER_SITE_OTHER): Payer: Medicare Other | Admitting: *Deleted

## 2010-07-21 DIAGNOSIS — I4891 Unspecified atrial fibrillation: Secondary | ICD-10-CM

## 2010-07-21 DIAGNOSIS — Z7901 Long term (current) use of anticoagulants: Secondary | ICD-10-CM

## 2010-07-21 LAB — POCT INR: INR: 2.5

## 2010-07-23 LAB — CBC
HCT: 33.7 % — ABNORMAL LOW (ref 36.0–46.0)
HCT: 35.8 % — ABNORMAL LOW (ref 36.0–46.0)
HCT: 36.8 % (ref 36.0–46.0)
HCT: 38.2 % (ref 36.0–46.0)
Hemoglobin: 11.3 g/dL — ABNORMAL LOW (ref 12.0–15.0)
Hemoglobin: 12.1 g/dL (ref 12.0–15.0)
Hemoglobin: 12.5 g/dL (ref 12.0–15.0)
Hemoglobin: 12.6 g/dL (ref 12.0–15.0)
Hemoglobin: 12.7 g/dL (ref 12.0–15.0)
MCHC: 32.8 g/dL (ref 30.0–36.0)
MCHC: 34 g/dL (ref 30.0–36.0)
MCV: 85.6 fL (ref 78.0–100.0)
MCV: 86.1 fL (ref 78.0–100.0)
MCV: 87.5 fL (ref 78.0–100.0)
Platelets: 259 10*3/uL (ref 150–400)
Platelets: 275 10*3/uL (ref 150–400)
Platelets: 341 10*3/uL (ref 150–400)
RBC: 3.91 MIL/uL (ref 3.87–5.11)
RBC: 4.12 MIL/uL (ref 3.87–5.11)
RBC: 4.21 MIL/uL (ref 3.87–5.11)
RBC: 4.36 MIL/uL (ref 3.87–5.11)
RBC: 4.38 MIL/uL (ref 3.87–5.11)
RDW: 13.6 % (ref 11.5–15.5)
RDW: 13.8 % (ref 11.5–15.5)
RDW: 13.8 % (ref 11.5–15.5)
RDW: 14.3 % (ref 11.5–15.5)
WBC: 10.8 10*3/uL — ABNORMAL HIGH (ref 4.0–10.5)
WBC: 11.1 10*3/uL — ABNORMAL HIGH (ref 4.0–10.5)
WBC: 12.4 10*3/uL — ABNORMAL HIGH (ref 4.0–10.5)
WBC: 12.7 10*3/uL — ABNORMAL HIGH (ref 4.0–10.5)
WBC: 9.7 10*3/uL (ref 4.0–10.5)

## 2010-07-23 LAB — BASIC METABOLIC PANEL
BUN: 13 mg/dL (ref 6–23)
BUN: 20 mg/dL (ref 6–23)
BUN: 34 mg/dL — ABNORMAL HIGH (ref 6–23)
CO2: 27 mEq/L (ref 19–32)
CO2: 27 mEq/L (ref 19–32)
CO2: 31 mEq/L (ref 19–32)
CO2: 32 mEq/L (ref 19–32)
Calcium: 7.2 mg/dL — ABNORMAL LOW (ref 8.4–10.5)
Calcium: 8.1 mg/dL — ABNORMAL LOW (ref 8.4–10.5)
Calcium: 8.3 mg/dL — ABNORMAL LOW (ref 8.4–10.5)
Calcium: 9.1 mg/dL (ref 8.4–10.5)
Calcium: 9.2 mg/dL (ref 8.4–10.5)
Calcium: 9.2 mg/dL (ref 8.4–10.5)
Calcium: 9.3 mg/dL (ref 8.4–10.5)
Chloride: 92 mEq/L — ABNORMAL LOW (ref 96–112)
Chloride: 94 mEq/L — ABNORMAL LOW (ref 96–112)
Chloride: 98 mEq/L (ref 96–112)
Creatinine, Ser: 0.93 mg/dL (ref 0.4–1.2)
Creatinine, Ser: 0.99 mg/dL (ref 0.4–1.2)
Creatinine, Ser: 1.11 mg/dL (ref 0.4–1.2)
Creatinine, Ser: 1.34 mg/dL — ABNORMAL HIGH (ref 0.4–1.2)
Creatinine, Ser: 1.6 mg/dL — ABNORMAL HIGH (ref 0.4–1.2)
Creatinine, Ser: 1.66 mg/dL — ABNORMAL HIGH (ref 0.4–1.2)
GFR calc Af Amer: 39 mL/min — ABNORMAL LOW (ref >60)
GFR calc Af Amer: 48 mL/min — ABNORMAL LOW (ref >60)
GFR calc Af Amer: 59 mL/min — ABNORMAL LOW (ref >60)
GFR calc Af Amer: 60 mL/min — ABNORMAL LOW (ref >60)
GFR calc Af Amer: >60 mL/min (ref >60)
GFR calc Af Amer: >60 mL/min (ref >60)
GFR calc Af Amer: >60 mL/min (ref >60)
GFR calc Af Amer: >60 mL/min (ref >60)
GFR calc non Af Amer: 31 mL/min — ABNORMAL LOW (ref >60)
GFR calc non Af Amer: 32 mL/min — ABNORMAL LOW (ref >60)
GFR calc non Af Amer: 40 mL/min — ABNORMAL LOW (ref >60)
GFR calc non Af Amer: 53 mL/min — ABNORMAL LOW (ref >60)
GFR calc non Af Amer: 54 mL/min — ABNORMAL LOW (ref >60)
GFR calc non Af Amer: >60 mL/min (ref >60)
Glucose, Bld: 146 mg/dL — ABNORMAL HIGH (ref 70–99)
Glucose, Bld: 160 mg/dL — ABNORMAL HIGH (ref 70–99)
Glucose, Bld: 213 mg/dL — ABNORMAL HIGH (ref 70–99)
Glucose, Bld: 237 mg/dL — ABNORMAL HIGH (ref 70–99)
Glucose, Bld: 239 mg/dL — ABNORMAL HIGH (ref 70–99)
Potassium: 2.8 mEq/L — ABNORMAL LOW (ref 3.5–5.1)
Potassium: 3.8 mEq/L (ref 3.5–5.1)
Potassium: 4.4 mEq/L (ref 3.5–5.1)
Potassium: 4.7 mEq/L (ref 3.5–5.1)
Potassium: 4.9 mEq/L (ref 3.5–5.1)
Sodium: 128 mEq/L — ABNORMAL LOW (ref 135–145)
Sodium: 128 mEq/L — ABNORMAL LOW (ref 135–145)
Sodium: 129 mEq/L — ABNORMAL LOW (ref 135–145)
Sodium: 129 mEq/L — ABNORMAL LOW (ref 135–145)
Sodium: 132 mEq/L — ABNORMAL LOW (ref 135–145)
Sodium: 136 mEq/L (ref 135–145)
Sodium: 138 mEq/L (ref 135–145)

## 2010-07-23 LAB — BLOOD GAS, ARTERIAL
O2 Content: 2.5 L/min
O2 Saturation: 98.2 %
Patient temperature: 98.6
pH, Arterial: 7.392 (ref 7.350–7.400)

## 2010-07-23 LAB — BRAIN NATRIURETIC PEPTIDE
Pro B Natriuretic peptide (BNP): 240 pg/mL — ABNORMAL HIGH (ref 0.0–100.0)
Pro B Natriuretic peptide (BNP): 326 pg/mL — ABNORMAL HIGH (ref 0.0–100.0)
Pro B Natriuretic peptide (BNP): 506 pg/mL — ABNORMAL HIGH (ref 0.0–100.0)

## 2010-07-23 LAB — POCT I-STAT 3, VENOUS BLOOD GAS (G3P V)
Acid-Base Excess: 2 mmol/L (ref 0.0–2.0)
Bicarbonate: 30.9 mEq/L — ABNORMAL HIGH (ref 20.0–24.0)
O2 Saturation: 65 %
TCO2: 33 mmol/L (ref 0–100)
pH, Ven: 7.287 (ref 7.250–7.300)

## 2010-07-23 LAB — POCT I-STAT 3, ART BLOOD GAS (G3+)
Acid-Base Excess: 2 mmol/L (ref 0.0–2.0)
pH, Arterial: 7.318 — ABNORMAL LOW (ref 7.350–7.400)

## 2010-07-23 LAB — GLUCOSE, CAPILLARY
Glucose-Capillary: 129 mg/dL — ABNORMAL HIGH (ref 70–99)
Glucose-Capillary: 134 mg/dL — ABNORMAL HIGH (ref 70–99)
Glucose-Capillary: 137 mg/dL — ABNORMAL HIGH (ref 70–99)
Glucose-Capillary: 169 mg/dL — ABNORMAL HIGH (ref 70–99)
Glucose-Capillary: 180 mg/dL — ABNORMAL HIGH (ref 70–99)
Glucose-Capillary: 197 mg/dL — ABNORMAL HIGH (ref 70–99)
Glucose-Capillary: 197 mg/dL — ABNORMAL HIGH (ref 70–99)
Glucose-Capillary: 204 mg/dL — ABNORMAL HIGH (ref 70–99)
Glucose-Capillary: 207 mg/dL — ABNORMAL HIGH (ref 70–99)
Glucose-Capillary: 207 mg/dL — ABNORMAL HIGH (ref 70–99)
Glucose-Capillary: 209 mg/dL — ABNORMAL HIGH (ref 70–99)
Glucose-Capillary: 222 mg/dL — ABNORMAL HIGH (ref 70–99)
Glucose-Capillary: 230 mg/dL — ABNORMAL HIGH (ref 70–99)
Glucose-Capillary: 236 mg/dL — ABNORMAL HIGH (ref 70–99)
Glucose-Capillary: 250 mg/dL — ABNORMAL HIGH (ref 70–99)
Glucose-Capillary: 250 mg/dL — ABNORMAL HIGH (ref 70–99)
Glucose-Capillary: 302 mg/dL — ABNORMAL HIGH (ref 70–99)
Glucose-Capillary: 78 mg/dL (ref 70–99)

## 2010-07-23 LAB — PROTIME-INR
INR: 1.1 (ref 0.00–1.49)
INR: 1.3 (ref 0.00–1.49)
Prothrombin Time: 15 seconds (ref 11.6–15.2)
Prothrombin Time: 19.7 seconds — ABNORMAL HIGH (ref 11.6–15.2)

## 2010-07-23 LAB — HEPARIN LEVEL (UNFRACTIONATED)
Heparin Unfractionated: 0.12 IU/mL — ABNORMAL LOW (ref 0.30–0.70)
Heparin Unfractionated: 0.46 IU/mL (ref 0.30–0.70)
Heparin Unfractionated: 0.48 IU/mL (ref 0.30–0.70)
Heparin Unfractionated: 0.62 IU/mL (ref 0.30–0.70)
Heparin Unfractionated: 0.63 IU/mL (ref 0.30–0.70)
Heparin Unfractionated: 1.13 IU/mL — ABNORMAL HIGH (ref 0.30–0.70)
Heparin Unfractionated: >2 IU/mL — ABNORMAL HIGH (ref 0.30–0.70)

## 2010-07-23 LAB — DIFFERENTIAL
Basophils Absolute: 0.1 10*3/uL (ref 0.0–0.1)
Lymphocytes Relative: 24 % (ref 12–46)
Monocytes Absolute: 1.2 10*3/uL — ABNORMAL HIGH (ref 0.1–1.0)
Monocytes Relative: 10 % (ref 3–12)
Neutro Abs: 7.6 10*3/uL (ref 1.7–7.7)

## 2010-07-23 LAB — APTT: aPTT: 47 seconds — ABNORMAL HIGH (ref 24–37)

## 2010-07-24 ENCOUNTER — Other Ambulatory Visit: Payer: Self-pay | Admitting: Cardiology

## 2010-08-14 ENCOUNTER — Other Ambulatory Visit: Payer: Self-pay | Admitting: Cardiology

## 2010-08-14 NOTE — Telephone Encounter (Signed)
Pt needs to schedule office visit for further refills.

## 2010-08-18 ENCOUNTER — Encounter: Payer: Medicare Other | Admitting: *Deleted

## 2010-08-25 NOTE — Discharge Summary (Signed)
Victoria Adams, Victoria Adams NO.:  [id number]   MEDICAL RECORD NO.:  [id number]          PATIENT TYPE:  INP   LOCATION:  4733                         FACILITY:  [facility]   PHYSICIAN:  Bevelyn Buckles. [name], MDDATE OF BIRTH:  1940/09/05   DATE OF ADMISSION:  06/10/2008  DATE OF DISCHARGE:  06/18/2008                               DISCHARGE SUMMARY   PRIMARY CARDIOLOGIST:  Dr. Lewayne Bunting.   PRIMARY CARE Victoria Adams:  Dr. Donzetta Sprung.   DISCHARGE DIAGNOSIS:  Acute systolic congestive heart failure.   SECONDARY DIAGNOSES:  1. Nonischemic cardiomyopathy:  (?) tachycardia induced      cardiomyopathy.  2. Atrial fibrillation with rapid ventricular response.  3. Left atrial appendage thrombus preventing cardioversion.  4. Coumadin anticoagulation with Lovenox bridging at the time of      discharge.  5. Nonobstructive coronary artery disease by cardiac catheterization      this admission.  6. Hypertension.  7. Hyperlipidemia.  8. Type 2 diabetes mellitus.  9. Hyponatremia in the setting of diuresis.  10.Morbid obesity.  11.Chronic low back pain.  12.Degenerative joint disease.  13.Vitamin C insufficiency.  14.History of nonalcoholic fatty liver.  15.Rosacea.  16.History of allergic rhinitis.  17.History of colonic polyps.  18.GERD.  19.History osteopenia.  20.Depression.   ALLERGIES:  CODEINE CAUSES NAUSEA.   PROCEDURES:  1. Left heart cardiac catheterization performed June 12, 2008,      revealing nonobstructive coronary artery disease.  2. A 2-D echocardiogram performed June 13, 2008, showing an EF of 25-      35% with dyskinesis of the entire septal wall and akinesis of the      inferior wall.  She had moderate mitral regurgitation.  3. Transesophageal echocardiogram for June 14, 2008, showed a mild      fixed plaque in the thoracic aorta with spontaneous contrast and      shadowing consistent with left atrial appendage thrombus.  Mild      mitral  regurgitation.   HISTORY OF PRESENT ILLNESS:  A 70 year old Caucasian female with the  above problem list.  The patient was in her usual state of health until  several months prior to admission when she began to experience abdominal  bloating and what she described as a band-like sensation around her  upper abdomen.  She also noted progressive edema, fatigue, orthopnea,  dyspnea on exertion.  She also noted intermittent irregular and tachy  palpitations.  She was seen by Dr. Reuel Boom on February 25 and admitted.  At [facility], she was found to be in atrial fibrillation with  rapid ventricle response of unknown duration.  A 2-D echocardiogram was  performed on February 26 showing an EF of 25% with what was felt to be  severe mitral regurgitation.  EF was down from previous reported EF of  41%.  The patient was seen by Dr. Andee Lineman in consultation and decision  made to transfer to [facility] for further evaluation.   HOSPITAL COURSE:  Upon admission, Victoria Adams was found to be renal  insufficiency with a creatinine of 1.6.  Her ACE inhibitor  and ARB were  held.  She subsequently diuresed a significant amount, dropping her  weight from 124.6 on March 3 to 116.16 this morning.  With weight  reduction, she has had significant symptomatic improvement as well.   In the setting of what was felt to be a nonischemic cardiomyopathy,  knowing that she had nonobstructive coronary disease, attention turned  to her atrial fibrillation.  It was felt that she would likely benefit  from TEE and cardioversion and TEE was performed on March 5.  Unfortunately, she was found to have left atrial appendage shadowing  spontaneous contrast suggestive of left atrial appendage clot and thus  cardioversion was cancelled.  The patient was maintained on heparin.  Coumadin was initiated.  Carvedilol was initiated and subsequently  titrated and digoxin was also added with improved heart rates in the 70s  and  80s.   As above, Victoria Adams has improved.  Her INR today is 1.60.  She had been  ambulating with occupational therapy with significant improvement in  exercise tolerance.  We have arranged through case management for her to  be discharged with Lovenox bridging until INR is therapeutic.  She will  follow up at [facility] in [place] on March 10 at  8:45 a.m.  will follow up with B-met in one week and subsequently follow-  up with Dr. Andee Lineman on March 25 at 11:15 a.m.  Victoria Adams is ready for  discharge today.   DISCHARGE LABORATORIES:  Hemoglobin 12.3, hematocrit 35.9, WBC 11.1,  platelets 259, INR 1.6.  Sodium 128, potassium 4.4, chloride 93, CO2 26,  BUN 20, creatinine 1.11, glucose 132, calcium 9.2, ammonia 45.  BNP  326.0.  Urine sodium 108.   DISPOSITION:  The patient will be discharged home today in good  condition.   FOLLOW-UP APPOINTMENTS:  As above.  We have arranged follow-up in our  Coumadin clinic tomorrow March 10 at 8:45 a.m.  We will follow up with B-  met next week on March 16.  She will follow up with Dr. Andee Lineman on March  25 at 11:15 a.m.  We have asked her to follow-up with Dr. Donzetta Sprung  with regards to her diabetic medications which have been adjusted since  admission.   DISCHARGE MEDICATIONS:  1. Aspirin 81 mg daily.  2. Coumadin 7.5 mg q.h.s.  3. Protonix 40 mg daily.  4. Effexor XL 75 mg daily.  5. Digoxin 0.125 mg daily.  6. Spirolactone 12.5 mg daily.  7. Crestor 10 mg daily.  8. Lasix 60 mg b.i.d.  9. Coreg 25 mg b.i.d.  10 . Lantus 65 units q.h.s. which is increased dose.  1. Lisinopril 10 mg b.i.d.  2. Calcium plus D 600 mg b.i.d.  3. MetroCream 0.75 mg daily for rosacea flares.  4. Doxycycline 100 mg daily for rosacea flares.  5. Vicodin 5/500 mg q.4 h p.r.n. back pain.  6. Lovenox 120 mg q.12 h until INR greater than or equal to 2.0.  7. The patient was advised to discontinue:      a.     Diovan/HCTZ.      b.      Benazepril.      c.     K-Dur.      d.     Metformin.      e.     Amaryl.      f.     Clonidine.   The patient notes that she was experiencing hypoglycemia on  home on  previous insulin regimen for insulin and oral diabetic regimen and thus  we are asked to follow-up with Dr. Reuel Boom prior to resuming her oral  medications.   OUTSTANDING LABORATORY STUDIES:  Follow-up B-met in one week.  Follow-up  INR tomorrow.  Duration discharge encounter 60 minutes including  physician time.      Nicolasa Ducking, ANP      Bevelyn Buckles. [name], MD  Electronically Signed    CB/MEDQ  D:  06/18/2008  T:  06/18/2008  Job:  [id number]   cc:   Donzetta Sprung

## 2010-08-25 NOTE — Assessment & Plan Note (Signed)
[facility]                          [facility]   Victoria Adams, Victoria Adams                      MRN:          [id number]  DATE:08/05/2008                            DOB:          1940/07/18    HISTORY OF PRESENT ILLNESS:  The patient is a 70 year old female with  history of irritable bowel syndrome as well as depression.  The patient  was admitted with acute on chronic congestive heart failure recently.  There are signs of nonischemic cardiomyopathy.  Her ejection fraction  was 30-35%.  She had initially severe mitral regurgitation, but on a  followup TEE, there was mild-to-moderate mitral regurgitation after  diuresis.  She was found have left atrial thrombus.  After adequate  anticoagulation, an attempt at cardioversion was done.  The patient was  returned to normal sinus rhythm but then today in the clinic she is back  in atrial fibrillation.  She thinks that 2 days after procedure she went  back in atrial fibrillation.  It is possible that the patient has long-  standing atrial fibrillation.  She remains, however, on Coumadin.  She  denies any chest pain, shortness of breath, orthopnea, PND, and from a  cardiovascular standpoint the patient has actually done quite well.   MEDICATIONS:  1. Aspirin 81 mg p.o. daily.  2. Coumadin as directed.  3. Effexor 150 mg p.o. daily.  4. Digoxin 125 mg p.o. daily.  5. Protonix 20 mg p.o. daily.  6. Crestor 20 mg p.o. daily.  7. Coreg 25 mg p.o. b.i.d.  8. Calcium.  9. Lantus insulin.  10.Lasix 40 mg p.o. b.i.d.  11.Lisinopril 10 mg half a tablet p.o. b.i.d.  12.Clonazepam 0.5 mg 1 tablet p.o. nightly.   PHYSICAL EXAMINATION:  VITAL SIGNS:  Blood pressure 134/80, heart rate  is 56, weight is 248.  NECK:  Normal carotid stroke.  No carotid bruits.  LUNGS:  Clear breath sounds bilaterally.  HEART:  Regular rate and rhythm with normal S1 and S2.  No murmur, rubs,  or gallops.  ABDOMEN:  Soft  and nontender.  No rebound or guarding.  Good bowel  sounds.  EXTREMITIES:  No cyanosis, clubbing, or edema.  NEUROLOGIC:  The patient is alert and oriented and grossly nonfocal.   PROBLEM LIST:  1. Status post acute on chronic congestive systolic heart failure,      ejection fraction of 35%, hemodynamically compensated.  2. Nonischemic cardiomyopathy, ejection fraction 30-35%.  3. Atrial fibrillation rate controlled, unsuccessful cardioversion.  4. Left atrial thrombus by transesophageal echocardiography.  5. Hyponatremia, resolved.  6. Hypokalemia, resolved.  7. Irritable bowel disease versus functional constipation syndrome.   PLAN:  1. At this point in time, we will not reattempt cardioversion.  The      patient will need to be placed on amiodarone and I think this will      complicate medical regimen.  She actually symptomatically cannot      tell the difference between normal sinus rhythm and atrial      fibrillation.  2. We will improve further rate control with increasing Coreg  to 37.5      mg p.o. b.i.d.  3. Repeat an echocardiogram in 6 months to follow up on her mitral      regurgitation, although this appears to have improved      significantly.  If her ejection fraction is back to normal, I do      think it is worthwhile to try to consider a repeat cardioversion as      the likelihood of success may be higher at that time.  4. The patient will need to stay on lifelong Coumadin given her high      risk for stroke.      Learta Codding, MD,FACC  Electronically Signed    GED/MedQ  DD: 08/05/2008  DT: 08/06/2008  Job #: [id number]   cc:   Donzetta Sprung

## 2010-08-25 NOTE — Cardiovascular Report (Signed)
Victoria Adams, Victoria Adams NO.:  [id number]   MEDICAL RECORD NO.:  [id number]          PATIENT TYPE:  INP   LOCATION:  2915                         FACILITY:  [facility]   PHYSICIAN:  Verne Carrow, MDDATE OF BIRTH:  [birth year]   DATE OF PROCEDURE:  06/12/2008  DATE OF DISCHARGE:                            CARDIAC CATHETERIZATION   PROCEDURES PERFORMED:  1. Left heart catheterization.  2. Right heart catheterization.  3. Measurement of left ventricular pressure.   OPERATOR:  Verne Carrow, MD   INDICATIONS:  Dilated cardiomyopathy as a new diagnosis in a patient,  found to have severe mitral regurgitation, atrial fibrillation, and  presenting with severe shortness of breath with likely volume overload.   DETAILS OF PROCEDURE:  The patient was brought to the inpatient cardiac  catheterization laboratory after signing informed consent for the  procedure.  The right groin was prepped and draped in a sterile fashion.  Lidocaine 1% was used for local anesthesia.  A 5-French sheath was  inserted into the right femoral artery without difficulty.  A 7-French  sheath was inserted into the right femoral vein without difficulty.  A  right heart catheterization was performed with a Swan-Ganz catheter.  A  pigtail catheter was inserted into the left ventricle.  Cardiac outputs  were obtained with the thermal dilution method.  The Swan-Ganz catheter  was removed.  Selective coronary angiography was performed with standard  diagnostic catheters.  No left ventricular angiogram was performed.  The  patient tolerated the procedure well and was taken to the holding area  in stable condition.   HEMODYNAMIC FINDINGS:  Right atrial pressure mean of 36, right  ventricular pressure 80/22 with end-diastolic pressure of 34, pulmonary  artery pressure 84/33 with a mean of 64, pulmonary capillary wedge  pressure 44/65 with a mean of 57, left ventricular pressure 130/27 with  an  end-diastolic pressure of 44, central aortic pressure 146/82.  Left  ventricular saturation 97%.  Pulmonary artery saturation 65%.  Cardiac  output by Fick method 5.86 with a cardiac index by Fick method 2.53.  Thermodilution cardiac output 4.19.  Thermodilution cardiac index 1.81.  Pulmonary vascular resistance 248.  Systemic vascular resistance 1337.   ANGIOGRAPHIC FINDINGS:  1. The left main coronary artery has no evidence of disease.  2. The left anterior descending is a large vessel that courses to the      apex and gives off moderate size diagonal branch.  There is a 50%      stenosis in the midportion of the LAD.  3. The circumflex artery has a 30% ostial stenosis.  This gives rise      to 2 large obtuse marginal branches that are free of disease.  4. The right coronary artery is a nondominant vessel that has a 40%      stenosis in the proximal portion of the vessel.   No left ventricular angiogram was performed secondary to renal  insufficiency.   IMPRESSION:  1. Moderate nonobstructive coronary artery disease.  2. Nonischemic cardiomyopathy.  3. Severe biventricular heart failure with elevated left ventricular  filling pressures and elevated right-sided pressures that are      suggestive of severe volume overload.   RECOMMENDATIONS:  The patient will be transferred to the step-down ICU.  We will begin aggressive diuresis by giving 80 mg of IV Lasix x1 now.  The patient will then be started on a Lasix drip at 10 mg per hour.  A  Foley catheter will be placed in the holding area.  We will also place  orders for the placement of a PICC line.  We will stop her beta-blocker  and will start digoxin.  We will also start afterload reduction with  Imdur and hydralazine.  A transthoracic echocardiogram will be obtained  later today.      Verne Carrow, MD  Electronically Signed     CM/MEDQ  D:  06/12/2008  T:  06/13/2008  Job:  [id number]

## 2010-08-25 NOTE — Assessment & Plan Note (Signed)
[facility]                          [facility]   Victoria Adams, Victoria Adams                      MRN:          [id number]  DATE:07/04/2008                            DOB:          Feb 16, 1941    HISTORY OF PRESENT ILLNESS:  The patient is a 70 year old female who  recently admitted with acute congestive heart failure.  The patient  underwent cardiac catheterization and was found to have a nonischemic  cardiomyopathy.  She was severely diuresed.  Her dry weight leaving the  hospital was 250 pounds, it is actually now below that.  She states that  she is not short of breath and has no chest pain.  She continues to have  problems with her functional bowel disorder and has not gone to the  bathroom in 3 days.  She also has now been complained with her MiraLax.  Daughter is with her and involved in her care and is somewhat confused  how to balance the fluid intake in the setting of her congestive heart  failure and on the other hand should not worsen her constipation (see  guidelines below).  The patient had laboratory work done.  Since she  does appear to be on the dry side with a BUN of 45 and creatinine of  1.5.  Interestingly, by nurse did not obtain her blood pressure, and I  did obtain her blood pressure in the left arm and it was 85 mmHg.   MEDICATIONS:  1. Aspirin 81 mg p.o. daily.  2. Coumadin as directed.  3. Effexor 150 mg p.o. daily.  4. Digoxin 125 mcg p.o. daily.  5. Protonix 20 mg p.o. daily.  6. Crestor 10 mg p.o. daily.  7. Lasix 40 mg 1-1/2 tablet p.o. b.i.d.  8. Coreg 75 mg p.o. b.i.d.  9. Lisinopril 10 mg p.o. b.i.d.  10.Calcium.  11.Lantus insulin.   PHYSICAL EXAMINATION:  VITAL SIGNS:  Blood pressure is 85, I could not  obtain a diastolic, heart rate 71, and weight 247 pounds.  GENERAL:  Obese, white female in no apparent distress.  HEENT:  Pupils; eyes are clear.  Conjunctiva is clear.  NECK:  Supple.  Normal  carotid upstroke and no carotid bruits.  LUNGS:  Clear breath sounds bilaterally.  HEART:  Irregular rate and rhythm with normal S1 with a second heart  sound.  There is a 2/6 murmur at the apex.  ABDOMEN:  Soft and nontender.  EXTREMITIES:  There is no edema and peripheral pulses are intact.   LABORATORY DATA:  Glucose 285, BUN is 25, creatinine is 1.5, sodium is  128, potassium is 5.0, and chloride is 92.   EKG shows atrial fibrillation and left bundle-branch block.   PROBLEM LIST:  1. Status post acute on chronic congestive heart failure,      hemodynamically compensated.  2. Ischemic cardiomyopathy.  3. Atrial fibrillation, rate controlled.  4. Dizziness secondary to hypotension.  5. Left atrial thrombus by TEE.  6. Hyponatremia.  7. Hyperkalemia.  8. Irritable bowel disease versus functional constipation syndrome.   PLAN:  1. I told the  patient to liberalize her fluid intake and her salt      intake in the next couple of days given her hypotension.  I told      the daughter this will be difficult to balance, but her weight      should not go above 250 pounds, which is her dry weight.  2. I also cut the patient's lisinopril in half given her hypotension.      In addition, Lasix has been cut to 40 mg twice day, however, her      weight just gone above 250 pounds, the recommendation was given to      give an extra 60 mg of Lasix.  3. The patient will return next week for an RN visit for blood      pressure check and laboratory work and to recheck her sodium,      potassium, as well as her blood pressure.  I have also told her      that if her dizziness worsens, she needs to call me immediately as      necessary.  4. I have given the patient also a prescription for clonazepam 0.5 mg      p.o. q.h.s. given her current anxiety and insomnia.  Trazodone is      problematic right now given her hypotension and I did not give the      prescription for next week.  Her blood pressure  is improved.  She      can go on very small dose of trazodone to help her sleep.  I feel      her mood disorders have been improved with increasing her Effexor.  5. PT/INR will be drawn today, and we will try for therapeutic INRs      every 3 weeks for the next 4 weeks, and I will proceed with      cardioversion.     Learta Codding, MD,FACC  Electronically Signed    GED/MedQ  DD: 07/04/2008  DT: 07/05/2008  Job #: [id number]   cc:   Donzetta Sprung

## 2010-09-01 ENCOUNTER — Encounter: Payer: Medicare Other | Admitting: *Deleted

## 2010-09-04 ENCOUNTER — Ambulatory Visit (INDEPENDENT_AMBULATORY_CARE_PROVIDER_SITE_OTHER): Payer: Medicare Other | Admitting: *Deleted

## 2010-09-04 DIAGNOSIS — I4891 Unspecified atrial fibrillation: Secondary | ICD-10-CM

## 2010-09-04 DIAGNOSIS — Z7901 Long term (current) use of anticoagulants: Secondary | ICD-10-CM

## 2010-09-14 ENCOUNTER — Other Ambulatory Visit: Payer: Self-pay | Admitting: Cardiology

## 2010-09-16 ENCOUNTER — Other Ambulatory Visit: Payer: Self-pay | Admitting: *Deleted

## 2010-09-16 MED ORDER — CARVEDILOL 25 MG PO TABS: 37.5000 mg | ORAL_TABLET | Freq: Two times a day (BID) | ORAL | Status: DC

## 2010-09-30 ENCOUNTER — Other Ambulatory Visit: Payer: Self-pay | Admitting: Cardiology

## 2010-10-02 ENCOUNTER — Encounter: Payer: Medicare Other | Admitting: *Deleted

## 2010-10-09 ENCOUNTER — Ambulatory Visit (INDEPENDENT_AMBULATORY_CARE_PROVIDER_SITE_OTHER): Payer: Medicare Other | Admitting: *Deleted

## 2010-10-09 DIAGNOSIS — I4891 Unspecified atrial fibrillation: Secondary | ICD-10-CM

## 2010-10-09 DIAGNOSIS — Z7901 Long term (current) use of anticoagulants: Secondary | ICD-10-CM

## 2010-10-10 ENCOUNTER — Other Ambulatory Visit: Payer: Self-pay | Admitting: Cardiology

## 2010-10-12 ENCOUNTER — Other Ambulatory Visit: Payer: Self-pay | Admitting: Cardiology

## 2010-10-26 ENCOUNTER — Other Ambulatory Visit: Payer: Self-pay | Admitting: *Deleted

## 2010-10-26 MED ORDER — LISINOPRIL 10 MG PO TABS: 10.0000 mg | ORAL_TABLET | Freq: Two times a day (BID) | ORAL | Status: DC

## 2010-11-03 ENCOUNTER — Encounter: Payer: Medicare Other | Admitting: *Deleted

## 2010-11-19 ENCOUNTER — Telehealth: Payer: Self-pay

## 2010-11-19 MED ORDER — LISINOPRIL 10 MG PO TABS: 10.0000 mg | ORAL_TABLET | Freq: Two times a day (BID) | ORAL | Status: DC

## 2010-11-19 NOTE — Telephone Encounter (Signed)
.   Requested Prescriptions   Signed Prescriptions Disp Refills  . lisinopril (PRINIVIL,ZESTRIL) 10 MG tablet 30 tablet 1    Sig: Take 1 tablet (10 mg total) by mouth 2 (two) times daily. PATIENT NEED OFFICE VISIT    Authorizing Provider: DE GENT, GUY    Ordering User: Lacie Scotts

## 2010-12-24 ENCOUNTER — Other Ambulatory Visit: Payer: Self-pay | Admitting: Cardiology

## 2011-01-26 ENCOUNTER — Other Ambulatory Visit: Payer: Self-pay | Admitting: Cardiology

## 2011-02-02 ENCOUNTER — Other Ambulatory Visit: Payer: Self-pay | Admitting: Cardiology

## 2011-02-09 ENCOUNTER — Encounter: Payer: Medicare Other | Admitting: *Deleted

## 2011-02-12 ENCOUNTER — Other Ambulatory Visit: Payer: Self-pay | Admitting: Cardiology

## 2011-02-12 ENCOUNTER — Encounter: Payer: Medicare Other | Admitting: *Deleted

## 2011-02-19 ENCOUNTER — Ambulatory Visit (INDEPENDENT_AMBULATORY_CARE_PROVIDER_SITE_OTHER): Payer: Medicare Other | Admitting: *Deleted

## 2011-02-19 ENCOUNTER — Encounter (INDEPENDENT_AMBULATORY_CARE_PROVIDER_SITE_OTHER): Payer: Medicare Other | Admitting: *Deleted

## 2011-02-19 DIAGNOSIS — Z7901 Long term (current) use of anticoagulants: Secondary | ICD-10-CM

## 2011-02-19 DIAGNOSIS — I4891 Unspecified atrial fibrillation: Secondary | ICD-10-CM

## 2011-03-19 ENCOUNTER — Encounter: Payer: Medicare Other | Admitting: *Deleted

## 2011-03-26 ENCOUNTER — Ambulatory Visit (INDEPENDENT_AMBULATORY_CARE_PROVIDER_SITE_OTHER): Payer: Medicare Other | Admitting: *Deleted

## 2011-03-26 DIAGNOSIS — I4891 Unspecified atrial fibrillation: Secondary | ICD-10-CM

## 2011-03-26 DIAGNOSIS — Z7901 Long term (current) use of anticoagulants: Secondary | ICD-10-CM

## 2011-04-23 ENCOUNTER — Encounter: Payer: Medicare Other | Admitting: *Deleted

## 2011-05-25 ENCOUNTER — Encounter: Payer: Medicare Other | Admitting: *Deleted

## 2011-06-17 ENCOUNTER — Encounter: Payer: Self-pay | Admitting: *Deleted

## 2011-06-18 ENCOUNTER — Ambulatory Visit: Payer: Medicare Other | Admitting: Cardiovascular Disease

## 2011-06-18 ENCOUNTER — Encounter: Payer: Self-pay | Admitting: *Deleted

## 2011-07-02 DIAGNOSIS — D649 Anemia, unspecified: Secondary | ICD-10-CM | POA: Diagnosis present

## 2011-07-02 DIAGNOSIS — N179 Acute kidney failure, unspecified: Secondary | ICD-10-CM | POA: Diagnosis present

## 2011-07-02 DIAGNOSIS — I252 Old myocardial infarction: Secondary | ICD-10-CM | POA: Diagnosis not present

## 2011-07-02 DIAGNOSIS — I5032 Chronic diastolic (congestive) heart failure: Secondary | ICD-10-CM | POA: Diagnosis present

## 2011-07-02 DIAGNOSIS — D72829 Elevated white blood cell count, unspecified: Secondary | ICD-10-CM | POA: Diagnosis present

## 2011-07-02 DIAGNOSIS — R112 Nausea with vomiting, unspecified: Secondary | ICD-10-CM | POA: Diagnosis not present

## 2011-07-02 DIAGNOSIS — Z9849 Cataract extraction status, unspecified eye: Secondary | ICD-10-CM | POA: Diagnosis not present

## 2011-07-02 DIAGNOSIS — E785 Hyperlipidemia, unspecified: Secondary | ICD-10-CM | POA: Diagnosis present

## 2011-07-02 DIAGNOSIS — Z7901 Long term (current) use of anticoagulants: Secondary | ICD-10-CM | POA: Diagnosis not present

## 2011-07-02 DIAGNOSIS — I509 Heart failure, unspecified: Secondary | ICD-10-CM | POA: Diagnosis present

## 2011-07-02 DIAGNOSIS — I4891 Unspecified atrial fibrillation: Secondary | ICD-10-CM | POA: Diagnosis present

## 2011-07-02 DIAGNOSIS — E162 Hypoglycemia, unspecified: Secondary | ICD-10-CM | POA: Diagnosis not present

## 2011-07-02 DIAGNOSIS — E1169 Type 2 diabetes mellitus with other specified complication: Secondary | ICD-10-CM | POA: Diagnosis present

## 2011-07-02 DIAGNOSIS — E7800 Pure hypercholesterolemia, unspecified: Secondary | ICD-10-CM | POA: Diagnosis present

## 2011-07-02 DIAGNOSIS — E860 Dehydration: Secondary | ICD-10-CM | POA: Diagnosis not present

## 2011-07-02 DIAGNOSIS — Z801 Family history of malignant neoplasm of trachea, bronchus and lung: Secondary | ICD-10-CM | POA: Diagnosis not present

## 2011-07-02 DIAGNOSIS — A088 Other specified intestinal infections: Secondary | ICD-10-CM | POA: Diagnosis not present

## 2011-07-02 DIAGNOSIS — T68XXXA Hypothermia, initial encounter: Secondary | ICD-10-CM | POA: Diagnosis not present

## 2011-07-02 DIAGNOSIS — K2970 Gastritis, unspecified, without bleeding: Secondary | ICD-10-CM | POA: Diagnosis not present

## 2011-07-02 DIAGNOSIS — R197 Diarrhea, unspecified: Secondary | ICD-10-CM | POA: Diagnosis not present

## 2011-07-02 DIAGNOSIS — I129 Hypertensive chronic kidney disease with stage 1 through stage 4 chronic kidney disease, or unspecified chronic kidney disease: Secondary | ICD-10-CM | POA: Diagnosis not present

## 2011-07-02 DIAGNOSIS — Z79899 Other long term (current) drug therapy: Secondary | ICD-10-CM | POA: Diagnosis not present

## 2011-07-02 DIAGNOSIS — I2510 Atherosclerotic heart disease of native coronary artery without angina pectoris: Secondary | ICD-10-CM | POA: Diagnosis present

## 2011-07-02 DIAGNOSIS — M545 Low back pain: Secondary | ICD-10-CM | POA: Diagnosis present

## 2011-07-02 DIAGNOSIS — K5289 Other specified noninfective gastroenteritis and colitis: Secondary | ICD-10-CM | POA: Diagnosis not present

## 2011-07-02 DIAGNOSIS — F329 Major depressive disorder, single episode, unspecified: Secondary | ICD-10-CM | POA: Diagnosis present

## 2011-07-02 DIAGNOSIS — K219 Gastro-esophageal reflux disease without esophagitis: Secondary | ICD-10-CM | POA: Diagnosis present

## 2011-07-15 DIAGNOSIS — K5289 Other specified noninfective gastroenteritis and colitis: Secondary | ICD-10-CM | POA: Diagnosis not present

## 2011-07-15 DIAGNOSIS — G4700 Insomnia, unspecified: Secondary | ICD-10-CM | POA: Diagnosis not present

## 2011-09-28 ENCOUNTER — Ambulatory Visit (INDEPENDENT_AMBULATORY_CARE_PROVIDER_SITE_OTHER): Payer: Medicare Other | Admitting: *Deleted

## 2011-09-28 DIAGNOSIS — Z7901 Long term (current) use of anticoagulants: Secondary | ICD-10-CM | POA: Diagnosis not present

## 2011-09-28 DIAGNOSIS — I4891 Unspecified atrial fibrillation: Secondary | ICD-10-CM

## 2011-10-12 ENCOUNTER — Encounter: Payer: Self-pay | Admitting: Cardiology

## 2011-10-26 ENCOUNTER — Ambulatory Visit (INDEPENDENT_AMBULATORY_CARE_PROVIDER_SITE_OTHER): Payer: Medicare Other | Admitting: *Deleted

## 2011-10-26 DIAGNOSIS — I4891 Unspecified atrial fibrillation: Secondary | ICD-10-CM

## 2011-10-26 DIAGNOSIS — Z7901 Long term (current) use of anticoagulants: Secondary | ICD-10-CM | POA: Diagnosis not present

## 2011-11-23 ENCOUNTER — Ambulatory Visit (INDEPENDENT_AMBULATORY_CARE_PROVIDER_SITE_OTHER): Payer: Medicare Other | Admitting: *Deleted

## 2011-11-23 DIAGNOSIS — I4891 Unspecified atrial fibrillation: Secondary | ICD-10-CM | POA: Diagnosis not present

## 2011-11-23 DIAGNOSIS — Z7901 Long term (current) use of anticoagulants: Secondary | ICD-10-CM | POA: Diagnosis not present

## 2011-12-09 DIAGNOSIS — M545 Low back pain: Secondary | ICD-10-CM | POA: Diagnosis not present

## 2011-12-09 DIAGNOSIS — IMO0002 Reserved for concepts with insufficient information to code with codable children: Secondary | ICD-10-CM | POA: Diagnosis not present

## 2011-12-09 DIAGNOSIS — E782 Mixed hyperlipidemia: Secondary | ICD-10-CM | POA: Diagnosis not present

## 2011-12-09 DIAGNOSIS — E669 Obesity, unspecified: Secondary | ICD-10-CM | POA: Diagnosis not present

## 2011-12-09 DIAGNOSIS — IMO0001 Reserved for inherently not codable concepts without codable children: Secondary | ICD-10-CM | POA: Diagnosis not present

## 2011-12-09 DIAGNOSIS — M1990 Unspecified osteoarthritis, unspecified site: Secondary | ICD-10-CM | POA: Diagnosis not present

## 2011-12-09 DIAGNOSIS — I10 Essential (primary) hypertension: Secondary | ICD-10-CM | POA: Diagnosis not present

## 2011-12-09 DIAGNOSIS — J309 Allergic rhinitis, unspecified: Secondary | ICD-10-CM | POA: Diagnosis not present

## 2011-12-21 ENCOUNTER — Ambulatory Visit (INDEPENDENT_AMBULATORY_CARE_PROVIDER_SITE_OTHER): Payer: Medicare Other | Admitting: *Deleted

## 2011-12-21 DIAGNOSIS — Z7901 Long term (current) use of anticoagulants: Secondary | ICD-10-CM | POA: Diagnosis not present

## 2011-12-21 DIAGNOSIS — I4891 Unspecified atrial fibrillation: Secondary | ICD-10-CM | POA: Diagnosis not present

## 2011-12-21 LAB — POCT INR: INR: 2.6

## 2012-02-02 ENCOUNTER — Encounter: Payer: Self-pay | Admitting: *Deleted

## 2012-04-10 DIAGNOSIS — E7800 Pure hypercholesterolemia, unspecified: Secondary | ICD-10-CM | POA: Diagnosis not present

## 2012-04-10 DIAGNOSIS — M1990 Unspecified osteoarthritis, unspecified site: Secondary | ICD-10-CM | POA: Diagnosis not present

## 2012-04-10 DIAGNOSIS — E669 Obesity, unspecified: Secondary | ICD-10-CM | POA: Diagnosis not present

## 2012-04-10 DIAGNOSIS — I10 Essential (primary) hypertension: Secondary | ICD-10-CM | POA: Diagnosis not present

## 2012-04-10 DIAGNOSIS — IMO0002 Reserved for concepts with insufficient information to code with codable children: Secondary | ICD-10-CM | POA: Diagnosis not present

## 2012-04-10 DIAGNOSIS — E782 Mixed hyperlipidemia: Secondary | ICD-10-CM | POA: Diagnosis not present

## 2012-04-10 DIAGNOSIS — E119 Type 2 diabetes mellitus without complications: Secondary | ICD-10-CM | POA: Diagnosis not present

## 2012-04-10 DIAGNOSIS — J309 Allergic rhinitis, unspecified: Secondary | ICD-10-CM | POA: Diagnosis not present

## 2012-04-18 DIAGNOSIS — Z23 Encounter for immunization: Secondary | ICD-10-CM | POA: Diagnosis not present

## 2012-05-31 DIAGNOSIS — M5137 Other intervertebral disc degeneration, lumbosacral region: Secondary | ICD-10-CM | POA: Diagnosis not present

## 2012-05-31 DIAGNOSIS — M5126 Other intervertebral disc displacement, lumbar region: Secondary | ICD-10-CM | POA: Diagnosis not present

## 2012-05-31 DIAGNOSIS — M51379 Other intervertebral disc degeneration, lumbosacral region without mention of lumbar back pain or lower extremity pain: Secondary | ICD-10-CM | POA: Diagnosis not present

## 2012-06-23 DIAGNOSIS — R7301 Impaired fasting glucose: Secondary | ICD-10-CM | POA: Diagnosis not present

## 2012-07-11 ENCOUNTER — Ambulatory Visit (INDEPENDENT_AMBULATORY_CARE_PROVIDER_SITE_OTHER): Payer: Medicare Other | Admitting: *Deleted

## 2012-07-11 DIAGNOSIS — Z7901 Long term (current) use of anticoagulants: Secondary | ICD-10-CM | POA: Diagnosis not present

## 2012-07-11 DIAGNOSIS — I4891 Unspecified atrial fibrillation: Secondary | ICD-10-CM | POA: Diagnosis not present

## 2012-07-11 LAB — POCT INR: INR: 1.8

## 2012-08-08 ENCOUNTER — Ambulatory Visit (INDEPENDENT_AMBULATORY_CARE_PROVIDER_SITE_OTHER): Payer: Medicare Other | Admitting: *Deleted

## 2012-08-08 DIAGNOSIS — I4891 Unspecified atrial fibrillation: Secondary | ICD-10-CM

## 2012-08-08 DIAGNOSIS — Z7901 Long term (current) use of anticoagulants: Secondary | ICD-10-CM | POA: Diagnosis not present

## 2012-08-22 ENCOUNTER — Ambulatory Visit (INDEPENDENT_AMBULATORY_CARE_PROVIDER_SITE_OTHER): Payer: Medicare Other | Admitting: *Deleted

## 2012-08-22 DIAGNOSIS — I4891 Unspecified atrial fibrillation: Secondary | ICD-10-CM

## 2012-08-22 DIAGNOSIS — Z7901 Long term (current) use of anticoagulants: Secondary | ICD-10-CM

## 2012-09-12 ENCOUNTER — Ambulatory Visit (INDEPENDENT_AMBULATORY_CARE_PROVIDER_SITE_OTHER): Payer: Medicare Other | Admitting: *Deleted

## 2012-09-12 DIAGNOSIS — Z7901 Long term (current) use of anticoagulants: Secondary | ICD-10-CM | POA: Diagnosis not present

## 2012-09-12 DIAGNOSIS — I4891 Unspecified atrial fibrillation: Secondary | ICD-10-CM

## 2012-09-18 ENCOUNTER — Ambulatory Visit (INDEPENDENT_AMBULATORY_CARE_PROVIDER_SITE_OTHER): Payer: Medicare Other | Admitting: Cardiology

## 2012-09-18 ENCOUNTER — Encounter: Payer: Self-pay | Admitting: Cardiology

## 2012-09-18 ENCOUNTER — Other Ambulatory Visit: Payer: Self-pay | Admitting: *Deleted

## 2012-09-18 VITALS — BP 131/65 | HR 79 | Ht 67.0 in | Wt 254.0 lb

## 2012-09-18 DIAGNOSIS — I4891 Unspecified atrial fibrillation: Secondary | ICD-10-CM

## 2012-09-18 DIAGNOSIS — I2510 Atherosclerotic heart disease of native coronary artery without angina pectoris: Secondary | ICD-10-CM

## 2012-09-18 DIAGNOSIS — I10 Essential (primary) hypertension: Secondary | ICD-10-CM

## 2012-09-18 DIAGNOSIS — I5022 Chronic systolic (congestive) heart failure: Secondary | ICD-10-CM

## 2012-09-18 DIAGNOSIS — I428 Other cardiomyopathies: Secondary | ICD-10-CM | POA: Diagnosis not present

## 2012-09-18 MED ORDER — METOLAZONE 5 MG PO TABS: 5.0000 mg | ORAL_TABLET | Freq: Every day | ORAL | Status: DC

## 2012-09-18 NOTE — Assessment & Plan Note (Signed)
Nonischemic, as noted above we will obtain a followup echocardiogram for reassessment of LVEF and filling pressures.

## 2012-09-18 NOTE — Assessment & Plan Note (Signed)
Plan at this time is to add Zaroxolyn 5 mg daily to her current regimen, followup weights with BMET in 2 weeks. Will also obtain an echocardiogram to reassess LVEF, previously improved as of 2011.

## 2012-09-18 NOTE — Assessment & Plan Note (Signed)
Prior documentation of nonobstructive disease, no active angina symptoms.

## 2012-09-18 NOTE — Assessment & Plan Note (Signed)
Continue strategy of heart rate control and anticoagulation. 

## 2012-09-18 NOTE — Progress Notes (Signed)
Clinical Summary Victoria Adams is a medically complex 72 y.o.female presenting for office followup. She is a former patient of Dr. Andee Adams, no recent records available for review, last seen in July 2011.  She states that she has gained approximately 10 pounds over the last year, still has trouble with leg edema. She also reports feeling of orthopnea  She reports compliance with her medications including high-dose Lasix. Her weight in clinic today is actually 2 pounds up from 2011. She states that she had lab work with Dr. Reuel Adams about 3 months ago, recalls being told that her renal function was normal.  Echocardiogram from August 2011 demonstrated LVEF 45-50% with global hypokinesis, increased filling pressures, mild left atrial enlargement, mildly elevated pulmonary artery systolic pressure. She has not had a followup assessment.  ECG today shows atrial fibrillation with atypical left bundle branch block. Coumadin is followed through the Coumadin clinic. She reports no bleeding problems.  Allergies  Allergen Reactions  . Codeine   . Tylox (Oxycodone-Acetaminophen)     Vision closing in & heart palpitations.     Current Outpatient Prescriptions  Medication Sig Dispense Refill  . aspirin 325 MG tablet Take 325 mg by mouth daily.      . calcium carbonate (OS-CAL) 600 MG TABS Take 600 mg by mouth 2 (two) times daily with a meal.      . COREG 25 MG tablet TAKE 1&1/2 TABLET TWICE DAILY WITH A MEAL. PATIENT  NEEDS TO BE SEEN.  20 each  0  . digoxin (LANOXIN) 0.125 MG tablet Take 125 mcg by mouth daily.      Marland Kitchen doxycycline (VIBRAMYCIN) 100 MG capsule Take 100 mg by mouth 2 (two) times daily as needed (rosacea).       . furosemide (LASIX) 40 MG tablet Take 80 mg by mouth 2 (two) times daily.      Marland Kitchen HYDROcodone-acetaminophen (NORCO) 10-325 MG per tablet Take 2 tablets by mouth 2 (two) times daily.      . insulin glargine (LANTUS) 100 UNIT/ML injection 100 units every morning & 80 units every evening       . lisinopril (PRINIVIL,ZESTRIL) 20 MG tablet Take 20 mg by mouth daily.      . metFORMIN (GLUCOPHAGE-XR) 500 MG 24 hr tablet Take 1,000 mg by mouth 2 (two) times daily.      . mometasone (ELOCON) 0.1 % lotion Apply topically daily. EAR ECZEMA      . omeprazole (PRILOSEC) 20 MG capsule Take 20 mg by mouth 2 (two) times daily.      . rosuvastatin (CRESTOR) 10 MG tablet Take 10 mg by mouth daily.      . sodium chloride (OCEAN) 0.65 % nasal spray Place 1 spray into the nose as needed for congestion.      Marland Kitchen venlafaxine (EFFEXOR) 75 MG tablet Take 150 mg by mouth daily.      Marland Kitchen warfarin (COUMADIN) 5 MG tablet Take by mouth as directed. MANAGED BY Victoria Adams       No current facility-administered medications for this visit.    Past Medical History  Diagnosis Date  . Obesity   . Chronic low back pain     Secondary to DJD  . Vitamin D deficiency   . Fatty liver disease, nonalcoholic   . Type 2 diabetes mellitus   . Allergic rhinitis   . Essential hypertension, benign   . Chronic systolic heart failure   . GERD (gastroesophageal reflux disease)   . Coronary atherosclerosis of  native coronary artery     Nonobstructive  . Osteopenia   . Depression   . Rosacea   . Mitral regurgitation   . Cardiomyopathy   . Atrial fibrillation     Past Surgical History  Procedure Laterality Date  . Total abdominal hysterectomy w/ bilateral salpingoophorectomy      for dermoid tumor  . Appendectomy    . Cataract extraction    . Cholecystectomy    . Tonsillectomy    . Rotator cuff repair      RIGHT SHOULDER  . Breast biopsy      RIGHT    Family History  Problem Relation Age of Onset  . Lung cancer Father   . Coronary artery disease Father   . Diabetes Father   . Lung cancer Mother   . Asthma Brother   . Other Grandchild     Grandchild with tetralogy of Fallot and Hirschsprng disease    Social History Victoria Adams reports that she has never smoked. She has never used smokeless tobacco. Ms.  Adams reports that she does not drink alcohol.  Review of Systems No chest pain symptoms. No syncope. Stable appetite. No reported bleeding problems. Otherwise negative.  Physical Examination Filed Vitals:   09/18/12 0934  BP: 131/65  Pulse: 79   Filed Weights   09/18/12 0934  Weight: 254 lb (115.214 kg)   Overweight woman, comfortable at rest. HEENT: Conjunctiva and lids normal, oropharynx clear. Neck: Supple, elevated JVP, no carotid bruits, no thyromegaly. Lungs: Clear to auscultation, nonlabored breathing at rest. Cardiac: Irregularly irregular, no S3, soft systolic murmur, no pericardial rub. Abdomen: Soft, nontender, protuberant, bowel sounds present. Extremities: Chronic appearing one plus edema and stasis, distal pulses 1-2+. Skin: Warm and dry. Musculoskeletal: No kyphosis. Neuropsychiatric: Alert and oriented x3, affect grossly appropriate.   Problem List and Plan   Chronic systolic heart failure Plan at this time is to add Zaroxolyn 5 mg daily to her current regimen, followup weights with BMET in 2 weeks. Will also obtain an echocardiogram to reassess LVEF, previously improved as of 2011.  CARDIOMYOPATHY, DILATED Nonischemic, as noted above we will obtain a followup echocardiogram for reassessment of LVEF and filling pressures.  Atrial fibrillation Continue strategy of heart rate control and anticoagulation.  CORONARY ATHEROSCLEROSIS NATIVE CORONARY ARTERY Prior documentation of nonobstructive disease, no active angina symptoms.  Essential hypertension, benign Reasonable blood pressure control today.    Victoria Adams, M.D., F.A.C.C.

## 2012-09-18 NOTE — Assessment & Plan Note (Signed)
Reasonable blood pressure control today. 

## 2012-09-18 NOTE — Patient Instructions (Addendum)
Your physician recommends that you schedule a follow-up appointment in: 2 months. Your physician has recommended you make the following change in your medication: Start zaroxolyn 5 mg daily. Your new prescription has been sent to your pharmacy. All other medications will remain the same. Your physician recommends that you return for lab work in: 2 weeks for BMET at [facility]. Your physician has requested that you have an echocardiogram. Echocardiography is a painless test that uses sound waves to create images of your heart. It provides your doctor with information about the size and shape of your heart and how well your heart's chambers and valves are working. This procedure takes approximately one hour. There are no restrictions for this procedure.

## 2012-09-21 ENCOUNTER — Other Ambulatory Visit: Payer: Self-pay | Admitting: *Deleted

## 2012-09-21 MED ORDER — TORSEMIDE 20 MG PO TABS: 40.0000 mg | ORAL_TABLET | Freq: Every day | ORAL | Status: DC

## 2012-09-27 ENCOUNTER — Other Ambulatory Visit: Payer: Medicare Other

## 2012-10-05 ENCOUNTER — Other Ambulatory Visit: Payer: Medicare Other

## 2012-10-11 ENCOUNTER — Other Ambulatory Visit: Payer: Medicare Other

## 2012-10-11 DIAGNOSIS — R0989 Other specified symptoms and signs involving the circulatory and respiratory systems: Secondary | ICD-10-CM

## 2012-10-12 ENCOUNTER — Telehealth: Payer: Self-pay | Admitting: Physician Assistant

## 2012-10-12 NOTE — Telephone Encounter (Signed)
Mrs. Wallman did not show for her procedure in [facility] on 10-12-12  Unable to reach patient by telephone.

## 2012-10-19 ENCOUNTER — Telehealth: Payer: Self-pay | Admitting: *Deleted

## 2012-10-19 ENCOUNTER — Other Ambulatory Visit (INDEPENDENT_AMBULATORY_CARE_PROVIDER_SITE_OTHER): Payer: Medicare Other

## 2012-10-19 ENCOUNTER — Other Ambulatory Visit: Payer: Self-pay

## 2012-10-19 DIAGNOSIS — I428 Other cardiomyopathies: Secondary | ICD-10-CM | POA: Diagnosis not present

## 2012-10-19 DIAGNOSIS — I2510 Atherosclerotic heart disease of native coronary artery without angina pectoris: Secondary | ICD-10-CM | POA: Diagnosis not present

## 2012-10-19 DIAGNOSIS — I5022 Chronic systolic (congestive) heart failure: Secondary | ICD-10-CM

## 2012-10-19 DIAGNOSIS — I4891 Unspecified atrial fibrillation: Secondary | ICD-10-CM

## 2012-10-19 DIAGNOSIS — I10 Essential (primary) hypertension: Secondary | ICD-10-CM | POA: Diagnosis not present

## 2012-10-19 NOTE — Telephone Encounter (Signed)
Patient reminded of BMET that is pending. Patient said she would go today before coming to our office for Echo.

## 2012-10-20 ENCOUNTER — Telehealth: Payer: Self-pay | Admitting: *Deleted

## 2012-10-20 NOTE — Telephone Encounter (Signed)
Message copied by Eustace Moore on Fri Oct 20, 2012  4:16 PM ------      Message from: MCDOWELL, Illene Bolus      Created: Fri Oct 20, 2012  7:55 AM       I reviewed the images from recent echocardiogram. LVEF looks to be in the range of 40-45%, not as vigorous as last assessment. Will continue medical therapy, adjustments made at most recent visit. ------

## 2012-10-20 NOTE — Telephone Encounter (Signed)
Message copied by Eustace Moore on Fri Oct 20, 2012  4:17 PM ------      Message from: Jonelle Sidle      Created: Fri Oct 20, 2012 11:52 AM       Reviewed. Creatinine has increased somewhat, now 1.3 consistent with increase in diuretic. Potassium is normal at 4.2. Continue same regimen. ------

## 2012-10-24 NOTE — Telephone Encounter (Signed)
Patient informed. 

## 2012-10-27 ENCOUNTER — Ambulatory Visit (INDEPENDENT_AMBULATORY_CARE_PROVIDER_SITE_OTHER): Payer: Medicare Other | Admitting: *Deleted

## 2012-10-27 DIAGNOSIS — Z7901 Long term (current) use of anticoagulants: Secondary | ICD-10-CM

## 2012-10-27 DIAGNOSIS — I4891 Unspecified atrial fibrillation: Secondary | ICD-10-CM

## 2012-12-01 ENCOUNTER — Encounter: Payer: Medicare Other | Admitting: Cardiology

## 2012-12-01 ENCOUNTER — Encounter: Payer: Self-pay | Admitting: Cardiology

## 2012-12-01 NOTE — Progress Notes (Signed)
Patient cancelled   This encounter was created in error - please disregard. 

## 2012-12-05 ENCOUNTER — Ambulatory Visit (INDEPENDENT_AMBULATORY_CARE_PROVIDER_SITE_OTHER): Payer: Medicare Other | Admitting: *Deleted

## 2012-12-05 DIAGNOSIS — I4891 Unspecified atrial fibrillation: Secondary | ICD-10-CM

## 2012-12-05 DIAGNOSIS — Z7901 Long term (current) use of anticoagulants: Secondary | ICD-10-CM

## 2012-12-05 LAB — POCT INR: INR: 2.9

## 2012-12-21 ENCOUNTER — Other Ambulatory Visit: Payer: Self-pay | Admitting: Cardiology

## 2012-12-21 MED ORDER — TORSEMIDE 20 MG PO TABS: 40.0000 mg | ORAL_TABLET | Freq: Every day | ORAL | Status: DC

## 2013-01-23 ENCOUNTER — Ambulatory Visit (INDEPENDENT_AMBULATORY_CARE_PROVIDER_SITE_OTHER): Payer: Medicare Other | Admitting: *Deleted

## 2013-01-23 DIAGNOSIS — I4891 Unspecified atrial fibrillation: Secondary | ICD-10-CM

## 2013-01-23 DIAGNOSIS — Z7901 Long term (current) use of anticoagulants: Secondary | ICD-10-CM | POA: Diagnosis not present

## 2013-01-23 LAB — POCT INR: INR: 1.9

## 2013-01-24 ENCOUNTER — Encounter: Payer: Self-pay | Admitting: Cardiology

## 2013-02-14 DIAGNOSIS — J309 Allergic rhinitis, unspecified: Secondary | ICD-10-CM | POA: Diagnosis not present

## 2013-02-14 DIAGNOSIS — E782 Mixed hyperlipidemia: Secondary | ICD-10-CM | POA: Diagnosis not present

## 2013-02-14 DIAGNOSIS — E538 Deficiency of other specified B group vitamins: Secondary | ICD-10-CM | POA: Diagnosis not present

## 2013-02-14 DIAGNOSIS — IMO0002 Reserved for concepts with insufficient information to code with codable children: Secondary | ICD-10-CM | POA: Diagnosis not present

## 2013-02-14 DIAGNOSIS — E669 Obesity, unspecified: Secondary | ICD-10-CM | POA: Diagnosis not present

## 2013-02-14 DIAGNOSIS — Z23 Encounter for immunization: Secondary | ICD-10-CM | POA: Diagnosis not present

## 2013-02-14 DIAGNOSIS — IMO0001 Reserved for inherently not codable concepts without codable children: Secondary | ICD-10-CM | POA: Diagnosis not present

## 2013-02-14 DIAGNOSIS — I10 Essential (primary) hypertension: Secondary | ICD-10-CM | POA: Diagnosis not present

## 2013-02-14 DIAGNOSIS — Z1331 Encounter for screening for depression: Secondary | ICD-10-CM | POA: Diagnosis not present

## 2013-02-14 DIAGNOSIS — M1990 Unspecified osteoarthritis, unspecified site: Secondary | ICD-10-CM | POA: Diagnosis not present

## 2013-02-20 ENCOUNTER — Ambulatory Visit (INDEPENDENT_AMBULATORY_CARE_PROVIDER_SITE_OTHER): Payer: Medicare Other | Admitting: *Deleted

## 2013-02-20 DIAGNOSIS — Z7901 Long term (current) use of anticoagulants: Secondary | ICD-10-CM

## 2013-02-20 DIAGNOSIS — I4891 Unspecified atrial fibrillation: Secondary | ICD-10-CM | POA: Diagnosis not present

## 2013-02-20 LAB — POCT INR: INR: 2.7

## 2013-03-14 ENCOUNTER — Encounter: Payer: Medicare Other | Admitting: Cardiology

## 2013-03-14 ENCOUNTER — Encounter: Payer: Self-pay | Admitting: Cardiology

## 2013-03-14 ENCOUNTER — Encounter: Payer: Self-pay | Admitting: *Deleted

## 2013-03-14 NOTE — Progress Notes (Signed)
No show  This encounter was created in error - please disregard.

## 2013-03-20 ENCOUNTER — Ambulatory Visit (INDEPENDENT_AMBULATORY_CARE_PROVIDER_SITE_OTHER): Payer: Medicare Other | Admitting: *Deleted

## 2013-03-20 DIAGNOSIS — I4891 Unspecified atrial fibrillation: Secondary | ICD-10-CM | POA: Diagnosis not present

## 2013-03-20 DIAGNOSIS — Z7901 Long term (current) use of anticoagulants: Secondary | ICD-10-CM

## 2013-03-20 LAB — POCT INR: INR: 2.2

## 2013-04-27 ENCOUNTER — Other Ambulatory Visit: Payer: Self-pay | Admitting: Cardiology

## 2013-05-11 ENCOUNTER — Encounter: Payer: Self-pay | Admitting: Cardiology

## 2013-05-11 ENCOUNTER — Encounter: Payer: Medicare Other | Admitting: Cardiology

## 2013-05-11 NOTE — Progress Notes (Signed)
No show  This encounter was created in error - please disregard.

## 2013-05-14 ENCOUNTER — Encounter: Payer: Self-pay | Admitting: Cardiology

## 2013-06-06 ENCOUNTER — Other Ambulatory Visit: Payer: Self-pay | Admitting: Cardiology

## 2013-06-30 ENCOUNTER — Other Ambulatory Visit: Payer: Self-pay | Admitting: Cardiology

## 2013-07-03 ENCOUNTER — Telehealth: Payer: Self-pay | Admitting: Cardiology

## 2013-07-03 NOTE — Telephone Encounter (Signed)
Patient's daughter called and cancelled her appt for today for CCR.  I told her that I would be having a nurse call her back in reference to her constant no show and cancellations with her CCR checks.

## 2013-07-10 NOTE — Telephone Encounter (Signed)
Pt has appt 07/20/13 with Dr Domenic Polite.  Scheduled pt for coumadin appt at the same time, since has cancelled last several appts.  LMOM with information for pt.

## 2013-07-20 ENCOUNTER — Ambulatory Visit (INDEPENDENT_AMBULATORY_CARE_PROVIDER_SITE_OTHER): Payer: Medicare Other | Admitting: *Deleted

## 2013-07-20 ENCOUNTER — Other Ambulatory Visit: Payer: Self-pay | Admitting: *Deleted

## 2013-07-20 ENCOUNTER — Encounter: Payer: Self-pay | Admitting: Cardiology

## 2013-07-20 ENCOUNTER — Ambulatory Visit (INDEPENDENT_AMBULATORY_CARE_PROVIDER_SITE_OTHER): Payer: Medicare Other | Admitting: Cardiology

## 2013-07-20 VITALS — BP 137/71 | HR 91 | Ht 67.0 in | Wt 256.0 lb

## 2013-07-20 DIAGNOSIS — I428 Other cardiomyopathies: Secondary | ICD-10-CM

## 2013-07-20 DIAGNOSIS — I4891 Unspecified atrial fibrillation: Secondary | ICD-10-CM

## 2013-07-20 DIAGNOSIS — I2510 Atherosclerotic heart disease of native coronary artery without angina pectoris: Secondary | ICD-10-CM | POA: Diagnosis not present

## 2013-07-20 DIAGNOSIS — Z7901 Long term (current) use of anticoagulants: Secondary | ICD-10-CM

## 2013-07-20 LAB — POCT INR: INR: 3.3

## 2013-07-20 MED ORDER — TORSEMIDE 20 MG PO TABS: 60.0000 mg | ORAL_TABLET | Freq: Every day | ORAL | Status: DC

## 2013-07-20 NOTE — Assessment & Plan Note (Signed)
History of nonobstructive disease. No active angina. Can stop aspirin since also on Coumadin.

## 2013-07-20 NOTE — Assessment & Plan Note (Signed)
LVEF approximately 40-45% on my review of echocardiogram from July of last year. Continue medical therapy. We will stop Lasix and simplify to Demadex 60 mg daily. Followup BMET in a few weeks to determine if potassium supplement is needed. Office followup arranged in the next 3 months.

## 2013-07-20 NOTE — Assessment & Plan Note (Signed)
Continue current regimen including Coreg and digoxin for heart rate control, also Coumadin. Keep follow up in the Coumadin clinic.

## 2013-07-20 NOTE — Assessment & Plan Note (Signed)
Blood pressure is reasonably well controlled today.

## 2013-07-20 NOTE — Patient Instructions (Addendum)
Your physician recommends that you schedule a follow-up appointment in: 3 months. You will receive a reminder letter in the mail in about 1-2 months reminding you to call and schedule your appointment. If you don't receive this letter, please contact our office. Your physician has recommended you make the following change in your medication:  STOP FUROSEMIDE STOP ASPIRIN INCREASE TORSEMIDE TO 60 MG DAILY. PLEASE TAKE (3) OF YOUR 20 MG TABLETS TO EQUAL 60 MG. ALL OTHER MEDICATIONS WILL REMAIN THE SAME. Your physician has requested that you have an echocardiogram in 3 months just before your next visit. Echocardiography is a painless test that uses sound waves to create images of your heart. It provides your doctor with information about the size and shape of your heart and how well your heart's chambers and valves are working. This procedure takes approximately one hour. There are no restrictions for this procedure. Your physician recommends that you have lab work in about 2 weeks around August 03, 2013 to check your BMET.

## 2013-07-20 NOTE — Progress Notes (Signed)
Clinical Summary Ms. Tun is a 73 y.o.female last seen in June 2014. At that time Zaroxolyn was added to her medical regimen and followup echocardiogram was arranged. In reviewing her medications, looks like she actually ended up getting Demadex, and has continued this with Lasix.  Lab work from July 2014 showed BUN 24, creatinine 1.3, potassium 4.2. Echocardiogram from July 2014 showed mild to moderate LVH, LVEF 40-45% by my review, moderate left atrial enlargement, mild right atrial enlargement  She is here with her daughter. Reports being chronically short of breath, although not able to increase her activity due to chronic lower back pain that limits her walking substantially. She states her leg edema is better. Weight has not come down.  Today we reviewed her medications, diuretic usage.   Allergies  Allergen Reactions  . Codeine   . Tylox [Oxycodone-Acetaminophen]     Vision closing in & heart palpitations.     Current Outpatient Prescriptions  Medication Sig Dispense Refill  . calcium carbonate (OS-CAL) 600 MG TABS Take 600 mg by mouth 2 (two) times daily with a meal.      . COREG 25 MG tablet TAKE 1&1/2 TABLET TWICE DAILY WITH A MEAL. PATIENT  NEEDS TO BE SEEN.  20 each  0  . digoxin (LANOXIN) 0.125 MG tablet Take 125 mcg by mouth daily.      Marland Kitchen doxycycline (VIBRAMYCIN) 100 MG capsule Take 100 mg by mouth 2 (two) times daily as needed (rosacea).       Marland Kitchen HYDROcodone-acetaminophen (NORCO) 10-325 MG per tablet Take 1 tablet by mouth every 6 (six) hours as needed.       . insulin glargine (LANTUS) 100 UNIT/ML injection 100 units every morning & 80 units every evening      . iron polysaccharides (NIFEREX) 150 MG capsule Take 150 mg by mouth 2 (two) times daily.      Marland Kitchen lisinopril (PRINIVIL,ZESTRIL) 20 MG tablet Take 20 mg by mouth daily.      . metFORMIN (GLUCOPHAGE-XR) 500 MG 24 hr tablet Take 1,000 mg by mouth 2 (two) times daily.      . mometasone (ELOCON) 0.1 % lotion Apply  topically daily. EAR ECZEMA      . omeprazole (PRILOSEC) 20 MG capsule Take 20 mg by mouth 2 (two) times daily.      Marland Kitchen torsemide (DEMADEX) 20 MG tablet TAKE 2 TABLETS BY MOUTH ONCE DAILY.  60 tablet  0  . venlafaxine (EFFEXOR) 75 MG tablet Take 150 mg by mouth daily.      Marland Kitchen warfarin (COUMADIN) 5 MG tablet Take by mouth as directed. MANAGED BY LISA       No current facility-administered medications for this visit.    Past Medical History  Diagnosis Date  . Obesity   . Chronic low back pain     Secondary to DJD  . Vitamin D deficiency   . Fatty liver disease, nonalcoholic   . Type 2 diabetes mellitus   . Allergic rhinitis   . Essential hypertension, benign   . Chronic systolic heart failure   . GERD (gastroesophageal reflux disease)   . Coronary atherosclerosis of native coronary artery     Nonobstructive  . Osteopenia   . Depression   . Rosacea   . Mitral regurgitation   . Cardiomyopathy   . Atrial fibrillation     Social History Ms. Keown reports that she has never smoked. She has never used smokeless tobacco. Ms. Leason reports that she  does not drink alcohol.  Review of Systems As outlined above.  Physical Examination Filed Vitals:   07/20/13 1420  BP: 137/71  Pulse: 91   Filed Weights   07/20/13 1420  Weight: 256 lb (116.121 kg)   Overweight woman, comfortable at rest.  HEENT: Conjunctiva and lids normal, oropharynx clear.  Neck: Supple, elevated JVP, no carotid bruits, no thyromegaly.  Lungs: Clear to auscultation, nonlabored breathing at rest.  Cardiac: Irregularly irregular, no S3, soft systolic murmur, no pericardial rub.  Abdomen: Soft, nontender, protuberant, bowel sounds present.  Extremities: Mild edema and stasis, distal pulses 1-2+.  Skin: Warm and dry.  Musculoskeletal: No kyphosis.  Neuropsychiatric: Alert and oriented x3, affect grossly appropriate.   Problem List and Plan   Atrial fibrillation Continue current regimen including Coreg  and digoxin for heart rate control, also Coumadin. Keep follow up in the Coumadin clinic.  CARDIOMYOPATHY, DILATED LVEF approximately 40-45% on my review of echocardiogram from July of last year. Continue medical therapy. We will stop Lasix and simplify to Demadex 60 mg daily. Followup BMET in a few weeks to determine if potassium supplement is needed. Office followup arranged in the next 3 months.  CORONARY ATHEROSCLEROSIS NATIVE CORONARY ARTERY History of nonobstructive disease. No active angina. Can stop aspirin since also on Coumadin.  Essential hypertension, benign Blood pressure is reasonably well controlled today.    Satira Sark, M.D., F.A.C.C.

## 2013-08-10 DIAGNOSIS — I214 Non-ST elevation (NSTEMI) myocardial infarction: Secondary | ICD-10-CM

## 2013-08-10 HISTORY — DX: Non-ST elevation (NSTEMI) myocardial infarction: I21.4

## 2013-08-21 DIAGNOSIS — Z7901 Long term (current) use of anticoagulants: Secondary | ICD-10-CM | POA: Diagnosis not present

## 2013-08-21 DIAGNOSIS — K219 Gastro-esophageal reflux disease without esophagitis: Secondary | ICD-10-CM | POA: Diagnosis not present

## 2013-08-21 DIAGNOSIS — R079 Chest pain, unspecified: Secondary | ICD-10-CM | POA: Diagnosis not present

## 2013-08-21 DIAGNOSIS — E119 Type 2 diabetes mellitus without complications: Secondary | ICD-10-CM | POA: Diagnosis not present

## 2013-08-21 DIAGNOSIS — I10 Essential (primary) hypertension: Secondary | ICD-10-CM | POA: Diagnosis not present

## 2013-08-21 DIAGNOSIS — R0602 Shortness of breath: Secondary | ICD-10-CM | POA: Diagnosis not present

## 2013-08-21 DIAGNOSIS — R0789 Other chest pain: Secondary | ICD-10-CM | POA: Diagnosis not present

## 2013-08-21 DIAGNOSIS — I4891 Unspecified atrial fibrillation: Secondary | ICD-10-CM | POA: Diagnosis not present

## 2013-08-22 ENCOUNTER — Encounter (HOSPITAL_COMMUNITY): Payer: Self-pay | Admitting: Internal Medicine

## 2013-08-22 ENCOUNTER — Inpatient Hospital Stay (HOSPITAL_COMMUNITY): Payer: Medicare Other

## 2013-08-22 ENCOUNTER — Inpatient Hospital Stay (HOSPITAL_COMMUNITY)
Admission: EM | Admit: 2013-08-22 | Discharge: 2013-09-05 | DRG: 280 | Disposition: A | Discharge status: Home Health | Payer: Medicare Other | Source: Other Acute Inpatient Hospital | Attending: Cardiology | Admitting: Cardiology

## 2013-08-22 DIAGNOSIS — I059 Rheumatic mitral valve disease, unspecified: Secondary | ICD-10-CM | POA: Diagnosis present

## 2013-08-22 DIAGNOSIS — K5900 Constipation, unspecified: Secondary | ICD-10-CM | POA: Diagnosis present

## 2013-08-22 DIAGNOSIS — D649 Anemia, unspecified: Secondary | ICD-10-CM | POA: Diagnosis not present

## 2013-08-22 DIAGNOSIS — Z801 Family history of malignant neoplasm of trachea, bronchus and lung: Secondary | ICD-10-CM | POA: Diagnosis not present

## 2013-08-22 DIAGNOSIS — Z888 Allergy status to other drugs, medicaments and biological substances status: Secondary | ICD-10-CM

## 2013-08-22 DIAGNOSIS — M545 Low back pain, unspecified: Secondary | ICD-10-CM | POA: Diagnosis present

## 2013-08-22 DIAGNOSIS — N179 Acute kidney failure, unspecified: Secondary | ICD-10-CM | POA: Diagnosis not present

## 2013-08-22 DIAGNOSIS — R0902 Hypoxemia: Secondary | ICD-10-CM | POA: Diagnosis not present

## 2013-08-22 DIAGNOSIS — Z9181 History of falling: Secondary | ICD-10-CM | POA: Diagnosis not present

## 2013-08-22 DIAGNOSIS — Z6837 Body mass index (BMI) 37.0-37.9, adult: Secondary | ICD-10-CM

## 2013-08-22 DIAGNOSIS — Z833 Family history of diabetes mellitus: Secondary | ICD-10-CM

## 2013-08-22 DIAGNOSIS — I5023 Acute on chronic systolic (congestive) heart failure: Secondary | ICD-10-CM

## 2013-08-22 DIAGNOSIS — I517 Cardiomegaly: Secondary | ICD-10-CM | POA: Diagnosis not present

## 2013-08-22 DIAGNOSIS — M899 Disorder of bone, unspecified: Secondary | ICD-10-CM | POA: Diagnosis present

## 2013-08-22 DIAGNOSIS — I129 Hypertensive chronic kidney disease with stage 1 through stage 4 chronic kidney disease, or unspecified chronic kidney disease: Secondary | ICD-10-CM | POA: Diagnosis present

## 2013-08-22 DIAGNOSIS — I5043 Acute on chronic combined systolic (congestive) and diastolic (congestive) heart failure: Secondary | ICD-10-CM | POA: Diagnosis present

## 2013-08-22 DIAGNOSIS — I509 Heart failure, unspecified: Secondary | ICD-10-CM | POA: Diagnosis present

## 2013-08-22 DIAGNOSIS — I2510 Atherosclerotic heart disease of native coronary artery without angina pectoris: Secondary | ICD-10-CM | POA: Diagnosis present

## 2013-08-22 DIAGNOSIS — I5022 Chronic systolic (congestive) heart failure: Secondary | ICD-10-CM | POA: Diagnosis not present

## 2013-08-22 DIAGNOSIS — R0602 Shortness of breath: Secondary | ICD-10-CM | POA: Diagnosis not present

## 2013-08-22 DIAGNOSIS — K219 Gastro-esophageal reflux disease without esophagitis: Secondary | ICD-10-CM | POA: Diagnosis present

## 2013-08-22 DIAGNOSIS — I447 Left bundle-branch block, unspecified: Secondary | ICD-10-CM | POA: Diagnosis not present

## 2013-08-22 DIAGNOSIS — E119 Type 2 diabetes mellitus without complications: Secondary | ICD-10-CM | POA: Diagnosis present

## 2013-08-22 DIAGNOSIS — M949 Disorder of cartilage, unspecified: Secondary | ICD-10-CM

## 2013-08-22 DIAGNOSIS — D509 Iron deficiency anemia, unspecified: Secondary | ICD-10-CM | POA: Diagnosis not present

## 2013-08-22 DIAGNOSIS — I428 Other cardiomyopathies: Secondary | ICD-10-CM | POA: Diagnosis not present

## 2013-08-22 DIAGNOSIS — I252 Old myocardial infarction: Secondary | ICD-10-CM

## 2013-08-22 DIAGNOSIS — E559 Vitamin D deficiency, unspecified: Secondary | ICD-10-CM | POA: Diagnosis present

## 2013-08-22 DIAGNOSIS — Z885 Allergy status to narcotic agent status: Secondary | ICD-10-CM

## 2013-08-22 DIAGNOSIS — R197 Diarrhea, unspecified: Secondary | ICD-10-CM | POA: Diagnosis present

## 2013-08-22 DIAGNOSIS — F3289 Other specified depressive episodes: Secondary | ICD-10-CM | POA: Diagnosis present

## 2013-08-22 DIAGNOSIS — W19XXXA Unspecified fall, initial encounter: Secondary | ICD-10-CM | POA: Diagnosis present

## 2013-08-22 DIAGNOSIS — G8929 Other chronic pain: Secondary | ICD-10-CM | POA: Diagnosis present

## 2013-08-22 DIAGNOSIS — Z794 Long term (current) use of insulin: Secondary | ICD-10-CM | POA: Diagnosis not present

## 2013-08-22 DIAGNOSIS — E871 Hypo-osmolality and hyponatremia: Secondary | ICD-10-CM | POA: Diagnosis not present

## 2013-08-22 DIAGNOSIS — N183 Chronic kidney disease, stage 3 unspecified: Secondary | ICD-10-CM | POA: Diagnosis present

## 2013-08-22 DIAGNOSIS — M539 Dorsopathy, unspecified: Secondary | ICD-10-CM | POA: Diagnosis not present

## 2013-08-22 DIAGNOSIS — Z825 Family history of asthma and other chronic lower respiratory diseases: Secondary | ICD-10-CM

## 2013-08-22 DIAGNOSIS — I10 Essential (primary) hypertension: Secondary | ICD-10-CM | POA: Diagnosis present

## 2013-08-22 DIAGNOSIS — I214 Non-ST elevation (NSTEMI) myocardial infarction: Secondary | ICD-10-CM | POA: Diagnosis not present

## 2013-08-22 DIAGNOSIS — F329 Major depressive disorder, single episode, unspecified: Secondary | ICD-10-CM | POA: Diagnosis present

## 2013-08-22 DIAGNOSIS — Z7901 Long term (current) use of anticoagulants: Secondary | ICD-10-CM

## 2013-08-22 DIAGNOSIS — E876 Hypokalemia: Secondary | ICD-10-CM | POA: Diagnosis not present

## 2013-08-22 DIAGNOSIS — Y92009 Unspecified place in unspecified non-institutional (private) residence as the place of occurrence of the external cause: Secondary | ICD-10-CM

## 2013-08-22 DIAGNOSIS — K7689 Other specified diseases of liver: Secondary | ICD-10-CM | POA: Diagnosis present

## 2013-08-22 DIAGNOSIS — I4891 Unspecified atrial fibrillation: Secondary | ICD-10-CM

## 2013-08-22 DIAGNOSIS — IMO0002 Reserved for concepts with insufficient information to code with codable children: Secondary | ICD-10-CM | POA: Diagnosis not present

## 2013-08-22 DIAGNOSIS — R195 Other fecal abnormalities: Secondary | ICD-10-CM | POA: Diagnosis present

## 2013-08-22 DIAGNOSIS — M25559 Pain in unspecified hip: Secondary | ICD-10-CM | POA: Diagnosis not present

## 2013-08-22 DIAGNOSIS — R079 Chest pain, unspecified: Secondary | ICD-10-CM | POA: Diagnosis not present

## 2013-08-22 DIAGNOSIS — S79919A Unspecified injury of unspecified hip, initial encounter: Secondary | ICD-10-CM | POA: Diagnosis not present

## 2013-08-22 DIAGNOSIS — Z8249 Family history of ischemic heart disease and other diseases of the circulatory system: Secondary | ICD-10-CM

## 2013-08-22 DIAGNOSIS — S79929A Unspecified injury of unspecified thigh, initial encounter: Secondary | ICD-10-CM | POA: Diagnosis not present

## 2013-08-22 HISTORY — DX: Other cardiomyopathies: I42.8

## 2013-08-22 HISTORY — DX: Non-ST elevation (NSTEMI) myocardial infarction: I21.4

## 2013-08-22 HISTORY — DX: Unspecified osteoarthritis, unspecified site: M19.90

## 2013-08-22 HISTORY — DX: Iron deficiency anemia, unspecified: D50.9

## 2013-08-22 LAB — COMPREHENSIVE METABOLIC PANEL
ALBUMIN: 3.1 g/dL — AB (ref 3.5–5.2)
ALT: 14 U/L (ref 0–35)
AST: 32 U/L (ref 0–37)
Alkaline Phosphatase: 83 U/L (ref 39–117)
BUN: 22 mg/dL (ref 6–23)
CO2: 25 mEq/L (ref 19–32)
Calcium: 9.1 mg/dL (ref 8.4–10.5)
Chloride: 98 mEq/L (ref 96–112)
Creatinine, Ser: 1.29 mg/dL — ABNORMAL HIGH (ref 0.50–1.10)
GFR calc Af Amer: 47 mL/min — ABNORMAL LOW (ref >90)
GFR calc non Af Amer: 41 mL/min — ABNORMAL LOW (ref >90)
Glucose, Bld: 66 mg/dL — ABNORMAL LOW (ref 70–99)
POTASSIUM: 4.1 meq/L (ref 3.7–5.3)
Sodium: 139 mEq/L (ref 137–147)
Total Bilirubin: 0.3 mg/dL (ref 0.3–1.2)
Total Protein: 7.2 g/dL (ref 6.0–8.3)

## 2013-08-22 LAB — CBC WITH DIFFERENTIAL/PLATELET
BASOS ABS: 0.1 10*3/uL (ref 0.0–0.1)
Basophils Relative: 1 % (ref 0–1)
Eosinophils Absolute: 0.3 10*3/uL (ref 0.0–0.7)
Eosinophils Relative: 3 % (ref 0–5)
HEMATOCRIT: 29.7 % — AB (ref 36.0–46.0)
Hemoglobin: 8.6 g/dL — ABNORMAL LOW (ref 12.0–15.0)
Lymphocytes Relative: 28 % (ref 12–46)
Lymphs Abs: 3.2 10*3/uL (ref 0.7–4.0)
MCH: 20.6 pg — ABNORMAL LOW (ref 26.0–34.0)
MCHC: 29 g/dL — ABNORMAL LOW (ref 30.0–36.0)
MCV: 71.2 fL — ABNORMAL LOW (ref 78.0–100.0)
MONOS PCT: 10 % (ref 3–12)
Monocytes Absolute: 1.2 10*3/uL — ABNORMAL HIGH (ref 0.1–1.0)
Neutro Abs: 6.7 10*3/uL (ref 1.7–7.7)
Neutrophils Relative %: 58 % (ref 43–77)
Platelets: 342 10*3/uL (ref 150–400)
RBC: 4.17 MIL/uL (ref 3.87–5.11)
RDW: 19.1 % — AB (ref 11.5–15.5)
WBC: 11.5 10*3/uL — AB (ref 4.0–10.5)

## 2013-08-22 LAB — GLUCOSE, CAPILLARY
Glucose-Capillary: 52 mg/dL — ABNORMAL LOW (ref 70–99)
Glucose-Capillary: 141 mg/dL — ABNORMAL HIGH (ref 70–99)
Glucose-Capillary: 123 mg/dL — ABNORMAL HIGH (ref 70–99)
Glucose-Capillary: 123 mg/dL — ABNORMAL HIGH (ref 70–99)
Glucose-Capillary: 141 mg/dL — ABNORMAL HIGH (ref 70–99)

## 2013-08-22 LAB — PRO B NATRIURETIC PEPTIDE: Pro B Natriuretic peptide (BNP): 1368 pg/mL — ABNORMAL HIGH (ref 0–125)

## 2013-08-22 LAB — TROPONIN I
TROPONIN I: 4.17 ng/mL — AB (ref <0.30)
Troponin I: 1.45 ng/mL (ref <0.30)
Troponin I: 6.71 ng/mL (ref <0.30)

## 2013-08-22 LAB — IRON AND TIBC
Iron: 13 ug/dL — ABNORMAL LOW (ref 42–135)
SATURATION RATIOS: 4 % — AB (ref 20–55)
TIBC: 366 ug/dL (ref 250–470)
UIBC: 353 ug/dL (ref 125–400)

## 2013-08-22 LAB — MAGNESIUM: Magnesium: 2 mg/dL (ref 1.5–2.5)

## 2013-08-22 LAB — PROTIME-INR
INR: 2.28 — ABNORMAL HIGH (ref 0.00–1.49)
Prothrombin Time: 24.4 seconds — ABNORMAL HIGH (ref 11.6–15.2)

## 2013-08-22 LAB — FERRITIN: Ferritin: 6 ng/mL — ABNORMAL LOW (ref 10–291)

## 2013-08-22 LAB — MRSA PCR SCREENING: MRSA by PCR: NEGATIVE

## 2013-08-22 MED ORDER — INSULIN ASPART 100 UNIT/ML ~~LOC~~ SOLN
0.0000 [IU] | Freq: Three times a day (TID) | SUBCUTANEOUS | Status: DC
Start: 2013-08-22 — End: 2013-09-05
  Administered 2013-08-22 (×2): 2 [IU] via SUBCUTANEOUS
  Administered 2013-08-23: 09:00:00 3 [IU] via SUBCUTANEOUS
  Administered 2013-08-23: 5 [IU] via SUBCUTANEOUS
  Administered 2013-08-23: 18:00:00 8 [IU] via SUBCUTANEOUS
  Administered 2013-08-25 (×2): 5 [IU] via SUBCUTANEOUS
  Administered 2013-08-26: 8 [IU] via SUBCUTANEOUS
  Administered 2013-08-26: 3 [IU] via SUBCUTANEOUS
  Administered 2013-08-26 – 2013-08-27 (×2): 5 [IU] via SUBCUTANEOUS
  Administered 2013-08-27 – 2013-08-28 (×4): 3 [IU] via SUBCUTANEOUS
  Administered 2013-08-28: 2 [IU] via SUBCUTANEOUS
  Administered 2013-08-29: 3 [IU] via SUBCUTANEOUS
  Administered 2013-08-29: 2 [IU] via SUBCUTANEOUS
  Administered 2013-08-29 – 2013-08-30 (×2): 3 [IU] via SUBCUTANEOUS
  Administered 2013-08-30: 2 [IU] via SUBCUTANEOUS
  Administered 2013-08-31 (×2): 3 [IU] via SUBCUTANEOUS
  Administered 2013-09-01: 2 [IU] via SUBCUTANEOUS
  Administered 2013-09-02 – 2013-09-05 (×5): 3 [IU] via SUBCUTANEOUS

## 2013-08-22 MED ORDER — SODIUM CHLORIDE 0.9 % IJ SOLN
3.0000 mL | INTRAMUSCULAR | Status: DC | PRN
  Administered 2013-08-28: 3 mL via INTRAVENOUS

## 2013-08-22 MED ORDER — ATORVASTATIN CALCIUM 80 MG PO TABS
80.0000 mg | ORAL_TABLET | Freq: Every day | ORAL | Status: DC
  Administered 2013-08-22: 19:00:00 80 mg via ORAL
  Filled 2013-08-22 (×2): qty 1

## 2013-08-22 MED ORDER — INSULIN ASPART 100 UNIT/ML ~~LOC~~ SOLN
0.0000 [IU] | Freq: Every day | SUBCUTANEOUS | Status: DC
  Administered 2013-08-23 – 2013-08-24 (×2): 3 [IU] via SUBCUTANEOUS
  Administered 2013-08-25: 2 [IU] via SUBCUTANEOUS
  Administered 2013-08-26: 3 [IU] via SUBCUTANEOUS

## 2013-08-22 MED ORDER — INSULIN GLARGINE 100 UNIT/ML ~~LOC~~ SOLN
40.0000 [IU] | Freq: Two times a day (BID) | SUBCUTANEOUS | Status: DC
  Administered 2013-08-22: 23:00:00 40 [IU] via SUBCUTANEOUS
  Filled 2013-08-22 (×3): qty 0.4

## 2013-08-22 MED ORDER — DEXTROSE 50 % IV SOLN
INTRAVENOUS | Status: AC
  Administered 2013-08-22: 50 mL
  Filled 2013-08-22: qty 50

## 2013-08-22 MED ORDER — NITROGLYCERIN 2 % TD OINT
0.5000 [in_us] | TOPICAL_OINTMENT | Freq: Four times a day (QID) | TRANSDERMAL | Status: DC
  Administered 2013-08-22 – 2013-08-25 (×11): 0.5 [in_us] via TOPICAL
  Filled 2013-08-22: qty 30

## 2013-08-22 MED ORDER — PANTOPRAZOLE SODIUM 40 MG PO TBEC
40.0000 mg | DELAYED_RELEASE_TABLET | Freq: Every day | ORAL | Status: DC
  Administered 2013-08-22 – 2013-09-05 (×15): 40 mg via ORAL
  Filled 2013-08-22 (×13): qty 1

## 2013-08-22 MED ORDER — SODIUM CHLORIDE 0.9 % IV SOLN
250.0000 mL | INTRAVENOUS | Status: DC | PRN
  Administered 2013-08-31 – 2013-09-03 (×3): 250 mL via INTRAVENOUS

## 2013-08-22 MED ORDER — INSULIN GLARGINE 100 UNIT/ML ~~LOC~~ SOLN: 40.0000 [IU] | Freq: Two times a day (BID) | SUBCUTANEOUS | Status: DC

## 2013-08-22 MED ORDER — HEART ATTACK BOUNCING BOOK
Freq: Once | Status: AC
  Administered 2013-08-22: 23:00:00
  Filled 2013-08-22: qty 1

## 2013-08-22 MED ORDER — LISINOPRIL 20 MG PO TABS
20.0000 mg | ORAL_TABLET | Freq: Every day | ORAL | Status: DC
  Administered 2013-08-22 – 2013-08-28 (×7): 20 mg via ORAL
  Filled 2013-08-22 (×7): qty 1

## 2013-08-22 MED ORDER — VENLAFAXINE HCL 75 MG PO TABS
150.0000 mg | ORAL_TABLET | Freq: Two times a day (BID) | ORAL | Status: DC
  Administered 2013-08-22 – 2013-09-05 (×28): 150 mg via ORAL
  Filled 2013-08-22 (×31): qty 2

## 2013-08-22 MED ORDER — BIOTENE DRY MOUTH MT LIQD
15.0000 mL | Freq: Two times a day (BID) | OROMUCOSAL | Status: DC
  Administered 2013-08-22 – 2013-08-23 (×2): 15 mL via OROMUCOSAL

## 2013-08-22 MED ORDER — WARFARIN SODIUM 7.5 MG PO TABS
7.5000 mg | ORAL_TABLET | Freq: Every day | ORAL | Status: DC
  Filled 2013-08-22: qty 1

## 2013-08-22 MED ORDER — INSULIN GLARGINE 100 UNIT/ML ~~LOC~~ SOLN
40.0000 [IU] | Freq: Every day | SUBCUTANEOUS | Status: DC
  Filled 2013-08-22: qty 0.4

## 2013-08-22 MED ORDER — FUROSEMIDE 10 MG/ML IJ SOLN
60.0000 mg | Freq: Two times a day (BID) | INTRAMUSCULAR | Status: DC
  Administered 2013-08-22 (×2): 60 mg via INTRAVENOUS
  Filled 2013-08-22 (×5): qty 6

## 2013-08-22 MED ORDER — WARFARIN - PHARMACIST DOSING INPATIENT: Freq: Every day | Status: DC

## 2013-08-22 MED ORDER — WARFARIN SODIUM 7.5 MG PO TABS
7.5000 mg | ORAL_TABLET | ORAL | Status: DC
  Filled 2013-08-22: qty 1

## 2013-08-22 MED ORDER — HYDROCODONE-ACETAMINOPHEN 10-325 MG PO TABS
1.0000 | ORAL_TABLET | Freq: Four times a day (QID) | ORAL | Status: DC | PRN
  Administered 2013-08-22 – 2013-09-05 (×27): 1 via ORAL
  Filled 2013-08-22 (×27): qty 1

## 2013-08-22 MED ORDER — WARFARIN SODIUM 5 MG PO TABS: 5.0000 mg | ORAL_TABLET | ORAL | Status: DC

## 2013-08-22 MED ORDER — INSULIN GLARGINE 100 UNIT/ML ~~LOC~~ SOLN
60.0000 [IU] | Freq: Two times a day (BID) | SUBCUTANEOUS | Status: DC
  Filled 2013-08-22 (×2): qty 0.6

## 2013-08-22 MED ORDER — SODIUM CHLORIDE 0.9 % IJ SOLN
3.0000 mL | Freq: Two times a day (BID) | INTRAMUSCULAR | Status: DC
  Administered 2013-08-22 – 2013-09-05 (×19): 3 mL via INTRAVENOUS

## 2013-08-22 MED ORDER — CARVEDILOL 25 MG PO TABS
25.0000 mg | ORAL_TABLET | Freq: Two times a day (BID) | ORAL | Status: DC
  Administered 2013-08-22 – 2013-08-28 (×13): 25 mg via ORAL
  Filled 2013-08-22 (×14): qty 1
  Filled 2013-08-22: qty 2
  Filled 2013-08-22: qty 1

## 2013-08-22 MED ORDER — ASPIRIN EC 81 MG PO TBEC
81.0000 mg | DELAYED_RELEASE_TABLET | Freq: Every day | ORAL | Status: DC
  Administered 2013-08-22 – 2013-08-23 (×2): 81 mg via ORAL
  Filled 2013-08-22 (×3): qty 1

## 2013-08-22 NOTE — Progress Notes (Signed)
ANTICOAGULATION CONSULT NOTE - Initial Consult  Pharmacy Consult for heparin Indication: atrial fibrillation  Allergies  Allergen Reactions  . Codeine Nausea And Vomiting  . Tylox [Oxycodone-Acetaminophen]     Vision closing in & heart palpitations.     Patient Measurements: Height: 5\' 7"  (170.2 cm) Weight: 254 lb 10.1 oz (115.5 kg) IBW/kg (Calculated) : 61.6 Heparin Dosing Weight: 89 kg  Vital Signs: Temp: 97.8 F (36.6 C) (05/13 0257) Temp src: Oral (05/13 0257) BP: 149/60 mmHg (05/13 0755) Pulse Rate: 90 (05/13 0755)  Labs:  Recent Labs  08/22/13 0406  HGB 8.6*  HCT 29.7*  PLT 342  LABPROT 24.4*  INR 2.28*  CREATININE 1.29*  TROPONINI 1.45*    Estimated Creatinine Clearance: 52.5 ml/min (by C-G formula based on Cr of 1.29).   Medical History: Past Medical History  Diagnosis Date  . Obesity   . Chronic low back pain     Secondary to DJD  . Vitamin D deficiency   . Fatty liver disease, nonalcoholic   . Type 2 diabetes mellitus   . Allergic rhinitis   . Essential hypertension, benign   . Chronic systolic heart failure   . GERD (gastroesophageal reflux disease)   . Coronary atherosclerosis of native coronary artery     Nonobstructive  . Osteopenia   . Depression   . Rosacea   . Mitral regurgitation   . Cardiomyopathy   . Atrial fibrillation     Medications:  Scheduled:  . aspirin EC  81 mg Oral Daily  . atorvastatin  80 mg Oral q1800  . carvedilol  25 mg Oral BID WC  . furosemide  60 mg Intravenous BID  . lisinopril  20 mg Oral Daily  . nitroGLYCERIN  0.5 inch Topical 4 times per day  . pantoprazole  40 mg Oral Daily  . sodium chloride  3 mL Intravenous Q12H  . venlafaxine  150 mg Oral BID WC   Infusions:    Assessment: 73 yo female with afib came in with elevated troponin will be switched from coumadin to heparin when INR is <2.  Patient will go to cath lab when INR <1.7.  Goal of Therapy:  Heparin level 0.3-0.7 units/ml Monitor  platelets by anticoagulation protocol: Yes   Plan:  1) D/c coumadin.  F/u am INR. Start heparin when INR is <2.  Tsz-Yin Harjit Douds 08/22/2013,9:57 AM

## 2013-08-22 NOTE — Care Management Note (Addendum)
    Page 1 of 2   08/31/2013     2:45:24 PM CARE MANAGEMENT NOTE 08/31/2013  Patient:  Victoria Adams, Victoria Adams   Account Number:  [id number]  Date Initiated:  08/22/2013  Documentation initiated by:  [facility]  Subjective/Objective Assessment:   73 yo female presents with  acute on chronic combined heart failure decompensation as well as possible NSTEMI versus mild troponin elevation related to CHF exacerbation.// Home alone (transfer fro, [facility]).     Action/Plan:   IV lasix, anticoagulant, BB and ACE-inhibitor. //Access for Home Health services   Anticipated DC Date:     Anticipated DC Plan:  [place]  CM consult  Medication Assistance      [facility] Choice  HOME HEALTH   Choice offered to / List presented to:  C-1 Patient        [place] arranged  HH-1 RN  [place].   Status of service:  Completed, signed off Medicare Important Message given?  YES (If response is "NO", the following Medicare IM given date fields will be blank) Date Medicare IM given:  08/27/2013 Date Additional Medicare IM given:    Discharge Disposition:  [place]  Per UR Regulation:  Reviewed for med. necessity/level of care/duration of stay  If discussed at [place] of Stay Meetings, dates discussed:    Comments:  5/22  1431 debbie Joliene Salvador rn,bsn spoke w pt she has medicare d coverage for meds thru [employer]. have asked cm sec to ck for copay and if prior auth req. gave pt 30day free eliquis card.pt has 6.60 per month copay but will need prior auth called to [phone].  08/30/13 [place] MSN BSN CCM Transferred to [place] on [facility] gtt.  Pt having difficulty with mobility 2/2 weakness/fatigue.  PT tried to eval and pt declined - will schedule another attempt.  Crystal Hutchinson RN, BSN, MSHL, CCM  Nurse - Case Manager, ([facility] [place])  [phone]  08/28/2013 Social:  From home with  family Meds:  Self managed Transportation:  Family DME:  None but states need for walker with seat PT RECS:  PT and Rolling walker with 5" wheels;3in1 (PT)  - CM will order day of d/c. Disposition Plan:  Home with HHS:  RN/PT ([facility] notified).

## 2013-08-22 NOTE — Progress Notes (Addendum)
Pt. Seen and examined. Agree with the NP/PA-C note as written.  New chest pain and left arm pain - troponin is elevated to 1.45, above what would be expected with CHF alone. She is breathing better with IV diuresis. I would continue diuretics today. Hold warfarin - start heparin when INR <2.  Plan for LHC once INR is <1.7 (which may be Friday). Check 2D echo new WMA's or worsening systolic dysfunction. Decrease lantus to 40U QHS from BID dosing.  Pixie Casino, MD, [facility] Attending Cardiologist [place]

## 2013-08-22 NOTE — Progress Notes (Signed)
Utilization Review Completed Jakory Matsuo J. Redell Nazir, RN, BSN, NCM [phone]  

## 2013-08-22 NOTE — H&P (Signed)
History and Physical   Patient ID: Victoria Adams MRN: [id number], DOB/AGE: 1940-09-03   Admit date: 08/22/2013 Date of Consult: 08/22/2013  Primary Physician: Gar Ponto, MD Primary Cardiologist: Dr. Domenic Polite, [facility]  HPI: Victoria Adams is a 73 y.o. obese white female with PMHx of chronic low back pain, low physical functional status x 1 year but still performs ADL's, chronic anemia, IDDM-Type 2, HTN, chronic AF on warfarin and thought to be nonischemic dilated CMO/HFrEF=40-45% per 2014 Echo who follows in [facility] Cardiology clinic with Dr. Domenic Polite.  Patient was last seen in April 2015 and was stable in the office at that time.  She presents this morning as a transfer to [facility] from [facility] in [place], [place] after presenting there last night ~8pm with severe SOB and chest pain with radiation to the left arm.  Her initial ECG showed chronic AF and LBBB as noted previously.  Her first troponin was elevated at 0.1 and her D-dimer was marginally elevated at 0.5 (upper limit of normal for outside lab was 0.49) while in the setting of being on chronic anticoagulation with warfarin, INR was 2.0 at OSH.  She provides the following history:  She has been experiencing increasing swelling, DOE, orthopnea and PND x 2 weeks but particularly over the past 3-4 days.  She also has had some loose non-bloody stools and bouts of nausea though no vomiting.  She also reports recent issues with low blood glucose (BG of 40 a few days ago which she treated herself at home for with juice and it took a few hours before she started feeling back to normal), as well as a fall there 2 nights ago when she was going to the bathroom and fall on her tail bone after making a quick turn, denies syncope.  She says that she lives with chronic back pain due to multi-level disc disease but since the fall 2 nights ago her tail bone has been particularly hurting her; she has not shared this with any of the physicians at  [facility] prior to this transfer.  At the time of her arrival to [facility] she had 1/10 chest discomfort, some left arm tingling but otherwise felt that her SOB and CP were much improved in comparison to when she presented initially to the OSH.  At [facility] she received po lasix and some of her routine medications po but was not IV diuresed.  OSH Labs 08/21/13 at [facility]: WBC=10.3, Hb=9.1, MCV=68, PLT=305 Trop=0.1, BNP=232 BUN=22, Creat=1.34, eGFR=39 Na+=136, K+=3.9, Cl-=98, HCO3-=28, Alb=3.3, AlkP=75, AST and ALT wnl, Ca=8.9 INR=2.0, D-Dimer=0.50 Dig level=0.4 (low) CXR:  Negative for pulmonary edema.  Cardiomegaly.  No acute findings.  Problem List: Past Medical History  Diagnosis Date  . Obesity   . Chronic low back pain     Secondary to DJD  . Vitamin D deficiency   . Fatty liver disease, nonalcoholic   . Type 2 diabetes mellitus   . Allergic rhinitis   . Essential hypertension, benign   . Chronic systolic heart failure   . GERD (gastroesophageal reflux disease)   . Coronary atherosclerosis of native coronary artery     Nonobstructive  . Osteopenia   . Depression   . Rosacea   . Mitral regurgitation   . Cardiomyopathy   . Atrial fibrillation     Past Surgical History  Procedure Laterality Date  . Total abdominal hysterectomy w/ bilateral salpingoophorectomy      for dermoid tumor  . Appendectomy    .  Cataract extraction    . Cholecystectomy    . Tonsillectomy    . Rotator cuff repair      RIGHT SHOULDER  . Breast biopsy      RIGHT     Allergies:  Allergies  Allergen Reactions  . Codeine   . Tylox [Oxycodone-Acetaminophen]     Vision closing in & heart palpitations.     Home Medications: Prior to Admission medications   Medication Sig Start Date End Date Taking? Authorizing Provider  calcium carbonate (OS-CAL) 600 MG TABS Take 600 mg by mouth 2 (two) times daily with a meal.   Yes Historical Provider, MD  COREG 25 MG tablet TAKE 1&1/2 TABLET TWICE  DAILY WITH A MEAL. PATIENT  NEEDS TO BE SEEN. 10/10/10  Yes June Leap, MD  digoxin (LANOXIN) 0.125 MG tablet Take 125 mcg by mouth daily.   Yes Historical Provider, MD  doxycycline (VIBRAMYCIN) 100 MG capsule Take 100 mg by mouth 2 (two) times daily as needed (rosacea).    Yes Historical Provider, MD  HYDROcodone-acetaminophen (NORCO) 10-325 MG per tablet Take 1 tablet by mouth every 6 (six) hours as needed.    Yes Historical Provider, MD  insulin glargine (LANTUS) 100 UNIT/ML injection 100 units every morning & 80 units every evening   Yes Historical Provider, MD  iron polysaccharides (NIFEREX) 150 MG capsule Take 150 mg by mouth 2 (two) times daily.   Yes Historical Provider, MD  lisinopril (PRINIVIL,ZESTRIL) 20 MG tablet Take 20 mg by mouth daily.   Yes Historical Provider, MD  metFORMIN (GLUCOPHAGE-XR) 500 MG 24 hr tablet Take 1,000 mg by mouth 2 (two) times daily.   Yes Historical Provider, MD  mometasone (ELOCON) 0.1 % lotion Apply topically daily. EAR ECZEMA   Yes Historical Provider, MD  omeprazole (PRILOSEC) 20 MG capsule Take 20 mg by mouth 2 (two) times daily.   Yes Historical Provider, MD  torsemide (DEMADEX) 20 MG tablet Take 3 tablets (60 mg total) by mouth daily. 07/20/13  Yes Jonelle Sidle, MD  venlafaxine (EFFEXOR) 75 MG tablet Take 150 mg by mouth daily.   Yes Historical Provider, MD  warfarin (COUMADIN) 5 MG tablet Take by mouth as directed. MANAGED BY LISA   Yes Historical Provider, MD    Inpatient Medications:  . aspirin EC  81 mg Oral Daily  . carvedilol  25 mg Oral BID WC  . furosemide  60 mg Intravenous Q12H  . insulin glargine  60 Units Subcutaneous BID  . lisinopril  20 mg Oral Daily  . pantoprazole  40 mg Oral Daily  . sodium chloride  3 mL Intravenous Q12H  . venlafaxine  150 mg Oral BID WC  . warfarin  7.5 mg Oral q1800   Prescriptions prior to admission  Medication Sig Dispense Refill  . calcium carbonate (OS-CAL) 600 MG TABS Take 600 mg by mouth 2  (two) times daily with a meal.      . COREG 25 MG tablet TAKE 1&1/2 TABLET TWICE DAILY WITH A MEAL. PATIENT  NEEDS TO BE SEEN.  20 each  0  . digoxin (LANOXIN) 0.125 MG tablet Take 125 mcg by mouth daily.      Marland Kitchen doxycycline (VIBRAMYCIN) 100 MG capsule Take 100 mg by mouth 2 (two) times daily as needed (rosacea).       Marland Kitchen HYDROcodone-acetaminophen (NORCO) 10-325 MG per tablet Take 1 tablet by mouth every 6 (six) hours as needed.       . insulin  glargine (LANTUS) 100 UNIT/ML injection 100 units every morning & 80 units every evening      . iron polysaccharides (NIFEREX) 150 MG capsule Take 150 mg by mouth 2 (two) times daily.      Marland Kitchen lisinopril (PRINIVIL,ZESTRIL) 20 MG tablet Take 20 mg by mouth daily.      . metFORMIN (GLUCOPHAGE-XR) 500 MG 24 hr tablet Take 1,000 mg by mouth 2 (two) times daily.      . mometasone (ELOCON) 0.1 % lotion Apply topically daily. EAR ECZEMA      . omeprazole (PRILOSEC) 20 MG capsule Take 20 mg by mouth 2 (two) times daily.      Marland Kitchen torsemide (DEMADEX) 20 MG tablet Take 3 tablets (60 mg total) by mouth daily.  60 tablet  3  . venlafaxine (EFFEXOR) 75 MG tablet Take 150 mg by mouth daily.      Marland Kitchen warfarin (COUMADIN) 5 MG tablet Take by mouth as directed. MANAGED BY LISA        Family History  Problem Relation Age of Onset  . Lung cancer Father   . Coronary artery disease Father   . Diabetes Father   . Lung cancer Mother   . Asthma Brother   . Other Grandchild     Grandchild with tetralogy of Fallot and Hirschsprng disease     History   Social History  . Marital Status: Legally Separated    Spouse Name: N/A    Number of Children: N/A  . Years of Education: N/A   Occupational History  . Not on file.   Social History Main Topics  . Smoking status: Never Smoker   . Smokeless tobacco: Never Used  . Alcohol Use: No  . Drug Use: No  . Sexual Activity: Not Currently   Other Topics Concern  . Not on file   Social History Narrative  . No narrative on file       Review of Systems: All other systems reviewed and are otherwise negative except as noted above.  Physical Exam: Blood pressure 145/71, pulse 75, temperature 97.8 F (36.6 C), temperature source Oral, resp. rate 15, height $RemoveBe'5\' 7"'mOxOYmUMw$  (1.702 m), weight 115.5 kg (254 lb 10.1 oz), SpO2 99.00%.  General: Well developed, well nourished, in no acute distress. Head: Normocephalic, atraumatic, sclera non-icteric, no xanthomas, nares are without discharge.  Neck: Negative for carotid bruits. JVP 10-12 cmH20. Lungs: Clear bilaterally to auscultation without wheezes, rales, or rhonchi. Breathing is unlabored. Heart: RRR with S1 S2. No murmurs, rubs, or gallops appreciated. Abdomen: Soft, obese, non-tender, non-distended with normoactive bowel sounds. No hepatomegaly. No rebound/guarding. No obvious abdominal masses. Msk: Strength and tone appears normal for age. Extremities: No clubbing, cyanosis.  1-2+ B/L LE pitting edema.  Distal pedal pulses are 2+ and equal bilaterally. Neuro: Alert and oriented X 3. Moves all extremities spontaneously. Psych:  Responds to questions appropriately with a normal affect.  Labs: No results found for this basename: WBC, HGB, HCT, MCV, PLT,  in the last 72 hours No results found for this basename: VITAMINB12, FOLATE, FERRITIN, TIBC, IRON, RETICCTPCT,  in the last 72 hours No results found for this basename: DDIMER,  in the last 72 hours No results found for this basename: NA, K, CL, CO2, BUN, CREATININE, CALCIUM, LABALBU, PROT, BILITOT, ALKPHOS, ALT, AST, AMYLASE, LIPASE, GLUCOSE,  in the last 168 hours No results found for this basename: HGBA1C,  in the last 72 hours No results found for this basename: CKTOTAL, CKMB, CKMBINDEX, TROPONINI,  in the last 72 hours No components found with this basename: POCBNP,  No results found for this basename: CHOL, HDL, LDLCALC, TRIG, CHOLHDL, LDLDIRECT,  in the last 72 hours No results found for this basename: TSH, T4TOTAL, FREET3,  T3FREE, THYROIDAB,  in the last 72 hours  Radiology/Studies: No results found.  08/21/13 OSH 12-lead ECG:  AFib at 80 bpm, LBBB pattern, no change from prior ECG noted from June 2014.  ASSESSMENT:  73 yo obese WF with PMHx of chronic low back pain, low physical functional status x 1 year but still performs ADL's, chronic anemia, IDDM-Type 2, HTN, chronic AF on warfarin and thought to be nonischemic dilated CMO/HFrEF=40-45% per 2014 Echo.  She presents with likely acute on chronic combined heart failure decompensation as well as possible NSTEMI versus mild troponin elevation related to CHF exacerbation.  PLAN:  1-Admit to Stepdown unit in transfer from [place], service of on-call Cardiologist, Dr. Meda Coffee. 2-Acute on Chronic Combined Heart Failure Decompensation:  Check 2D Echo, Lasix $RemoveBef'60mg'AhLpZIJPfw$  IV q12 hrs, hold home dose torsemide, BNP was only mildly elevated but patient is obese, daily weights, strict I&O's, Fluid restrict 1.5 L/day, continue medical regimen of coreg, ACE-inhibitor.  Discontinue digoxin. 3-Possible NSTEMI:  No need for anticoagulation as patient is on warfarin and is anemic.  Continue to cycle troponin's.  NTG paste to chest wall is acceptable.  Dose statin.  Continue BB and ACE-inhibitor.  Patient has h/o non-obstructive ASCAD several years ago by cath done here; may warrant repeat coronary angiography after fluid optimized in the next 48-72 hours and if renal function remains agreeable and if anemia workup doesn't show any occult GI bleeding.  No overt need to keep NPO at this time.  Will also check an echo as stated above. 4-Moderate Microcytic Anemia (chronic):  Check iron profile, check stool heme-occult.   5-Anticoagulation for chronic AF:  Will continue warfarin for now, check daily PT/INR's.   6-Elevated D-Dimer at OSH likely an inflammatory marker rather than a representation of PE as patient is therapeutic on warfarin and was therapeutic in April as well. 7-Sacral pain after  recent fall at home:  Will check pelvic and sacral XR. 8-DVT PPx:  N/A as patient on warfarin.  Code Status:  FULL CODE.  Signed, Charlton Haws, MD [facility] Physician in Cardiology  08/22/2013, 3:32 AM

## 2013-08-22 NOTE — Progress Notes (Signed)
Patient Profile: 73 yo obese WF, direct transfer from [facility], followed by Dr. Domenic Polite, with PMHx of chronic low back pain, low physical functional status x 1 year but still performs ADL's, chronic anemia, IDDM-Type 2, HTN, chronic AF on warfarin and thought to be nonischemic dilated CMO/HFrEF=40-45% per 2014 Echo. She presents with likely acute on chronic combined heart failure decompensation as well as possible NSTEMI versus mild troponin elevation related to CHF exacerbation.  Troponin: 1.45 BNP: 1368 D-dimer was marginally elevated at OSH at 0.5 (upper limit of normal for outside lab was 0.49) INR: Therapeutic 2.28  Subjective: Breathing slightly better, but still using 2L [place]. Chest and back pain have resolved, but she is now with left arm pain.  Objective: Vital signs in last 24 hours: Temp:  [97.8 F (36.6 C)] 97.8 F (36.6 C) (05/13 0257) Pulse Rate:  [75] 75 (05/13 0257) Resp:  [15] 15 (05/13 0257) BP: (145)/(71) 145/71 mmHg (05/13 0257) SpO2:  [99 %] 99 % (05/13 0257) Weight:  [254 lb 10.1 oz (115.5 kg)] 254 lb 10.1 oz (115.5 kg) (05/13 0325)    Intake/Output from previous day: 05/12 0701 - 05/13 0700 In: -  Out: 600 [Urine:600] Intake/Output this shift:    Medications Current Facility-Administered Medications  Medication Dose Route Frequency Provider Last Rate Last Dose  . 0.9 %  sodium chloride infusion  250 mL Intravenous PRN Charlton Haws, MD      . aspirin EC tablet 81 mg  81 mg Oral Daily Charlton Haws, MD      . atorvastatin (LIPITOR) tablet 80 mg  80 mg Oral q1800 Charlton Haws, MD      . carvedilol (COREG) tablet 25 mg  25 mg Oral BID WC Charlton Haws, MD      . furosemide (LASIX) injection 60 mg  60 mg Intravenous BID Charlton Haws, MD   60 mg at 08/22/13 0551  . HYDROcodone-acetaminophen (NORCO) 10-325 MG per tablet 1 tablet  1 tablet Oral Q6H PRN Charlton Haws, MD      . insulin glargine (LANTUS) injection 60 Units  60 Units Subcutaneous BID Charlton Haws, MD      . lisinopril (PRINIVIL,ZESTRIL) tablet 20 mg  20 mg Oral Daily Charlton Haws, MD      . nitroGLYCERIN (NITROGLYN) 2 % ointment 0.5 inch  0.5 inch Topical 4 times per day Charlton Haws, MD   0.5 inch at 08/22/13 0551  . pantoprazole (PROTONIX) EC tablet 40 mg  40 mg Oral Daily Charlton Haws, MD      . sodium chloride 0.9 % injection 3 mL  3 mL Intravenous Q12H Charlton Haws, MD      . sodium chloride 0.9 % injection 3 mL  3 mL Intravenous PRN Charlton Haws, MD      . venlafaxine [facility]) tablet 150 mg  150 mg Oral BID WC Charlton Haws, MD      . warfarin (COUMADIN) tablet 7.5 mg  7.5 mg Oral q1800 Charlton Haws, MD      . Warfarin - Pharmacist Dosing Inpatient   Does not apply q1800 Rogue Bussing, [facility]        PE: General appearance: alert, cooperative, no distress and moderately obese Neck: mild JVD Lungs: clear to auscultation bilaterally Heart: irregularly irregular rhythm Extremities: 1+ bilateral LEE Pulses: 2+ and symmetric Skin: warm and dry Neurologic: Grossly normal  Lab Results:   Recent Labs  08/22/13 0406  WBC 11.5*  HGB 8.6*  HCT 29.7*  PLT 342  BMET  Recent Labs  08/22/13 0406  NA 139  K 4.1  CL 98  CO2 25  GLUCOSE 66*  BUN 22  CREATININE 1.29*  CALCIUM 9.1   PT/INR  Recent Labs  08/22/13 0406  LABPROT 24.4*  INR 2.28*   Cardiac Panel (last 3 results)  Recent Labs  08/22/13 0406  TROPONINI 1.45*    Studies/Results:  DG Hip Bilateral W/ Pelvis 08/22/13 FINDINGS: Femoral heads are well formed and located. Hip joint spaces are intact. Sacroiliac joints are symmetric.  No destructive bony lesions. Included soft tissue planes are non-suspicious. Moderate vascular calcifications.  IMPRESSION: No acute fracture deformity or dislocation.   DG Sacrum/Coccyx FINDINGS: There is no evidence of fracture or other focal bone lesions. Moderate vascular calcifications.  IMPRESSION: Negative.   Assessment/Plan      Principal Problem:   Acute on chronic systolic heart failure Active Problems:   CARDIOMYOPATHY, DILATED   Atrial fibrillation   Chronic systolic heart failure   CHF exacerbation  Plan: 1. Acute on Chronic Combined Heart Failure Decompensation:  BNP elevated at 1368. Continue 60 mg IV Lasix BID. Continue daily weights and strict I/Os. Continue BB, ACE-I and digoxin. Check 2D echo today (last echo was 2014, EF was 40-45%).  2. NSTEMI: troponin at OSH elevated at 0.1. Subsequent troponin here at [facility] is 1.45. Will continue to trend. Chest pain and back pain both resolved, but now with left arm pain. Continue nitro patch. No Heparin at this time, due to INR of 2.28. Plan for diagnostic LHC +/- PCI once INR <1.7. Continue ASA, BB, ACE and statin.   3. Atrial Fibrillation: Ventricular rate is controlled in the 70s. Continue BB for rate control. INR is therapeutic at 2.28 on Warfarin. If MD agrees with pursuing LHC, then warfarin will need to be held and IV heparin initiated once INR <2.0.   4. DM: Metformin on hold in the setting of acute CHF decompensation. Insulin ordered.   5. Moderate Microcytic Anemia (chronic): Check iron profile, check stool heme-occult.   6. Elevated D-Dimer:  at OSH likely an inflammatory marker rather than a representation of PE as patient is therapeutic on warfarin and was therapeutic in April as well.   7. Sacral pain: after recent fall at home. Pelvic and sacral XR both negative for fracture.    8. DVT PPx: N/A as patient on warfarin.     LOS: 0 days    Annaleese Guier M. Rosita Fire, PA-C 08/22/2013 7:04 AM

## 2013-08-22 NOTE — Progress Notes (Signed)
Critical lab elevated troponin 1.45  Patient is  stable at this time MD notified

## 2013-08-22 NOTE — Progress Notes (Signed)
Inpatient Diabetes Program Recommendations  AACE/ADA: New Consensus Statement on Inpatient Glycemic Control (2013)  Target Ranges:  Prepandial:   less than 140 mg/dL      Peak postprandial:   less than 180 mg/dL (1-2 hours)      Critically ill patients:  140 - 180 mg/dL     Results for FELISA, ZECHMAN (MRN [id number]) as of 08/22/2013 10:03  Ref. Range 08/22/2013 08:01 08/22/2013 08:41  Glucose-Capillary Latest Range: 70-99 mg/dL 52 (L) 141 (H)     Home DM Meds:  Lantus 90 units AM/ 70 units PM Metformin 1000 mg bid   Patient with HYPOglycemia this AM.  Lantus 60 units held this AM.  Donnelly Stager, PA with cardiology to discuss patient's glucose levels and insulin orders.  Decision made to start patient on 1/2 her home Lantus tonight at bedtime (Lantus 40 units bid) + Novolog Moderate SSI.  Orders placed into EPIC.     Will follow Wyn Quaker RN, MSN, CDE Diabetes Coordinator Inpatient Diabetes Program Team Pager: [phone] (8a-10p)

## 2013-08-23 DIAGNOSIS — I4891 Unspecified atrial fibrillation: Secondary | ICD-10-CM | POA: Diagnosis not present

## 2013-08-23 DIAGNOSIS — I517 Cardiomegaly: Secondary | ICD-10-CM | POA: Diagnosis not present

## 2013-08-23 DIAGNOSIS — I214 Non-ST elevation (NSTEMI) myocardial infarction: Secondary | ICD-10-CM | POA: Diagnosis not present

## 2013-08-23 DIAGNOSIS — I5023 Acute on chronic systolic (congestive) heart failure: Secondary | ICD-10-CM | POA: Diagnosis not present

## 2013-08-23 DIAGNOSIS — R079 Chest pain, unspecified: Secondary | ICD-10-CM | POA: Diagnosis not present

## 2013-08-23 LAB — COMPREHENSIVE METABOLIC PANEL
ALK PHOS: 84 U/L (ref 39–117)
ALT: 13 U/L (ref 0–35)
AST: 31 U/L (ref 0–37)
Albumin: 3.2 g/dL — ABNORMAL LOW (ref 3.5–5.2)
BUN: 21 mg/dL (ref 6–23)
CALCIUM: 9 mg/dL (ref 8.4–10.5)
CO2: 29 mEq/L (ref 19–32)
Chloride: 98 mEq/L (ref 96–112)
Creatinine, Ser: 1.42 mg/dL — ABNORMAL HIGH (ref 0.50–1.10)
GFR calc Af Amer: 42 mL/min — ABNORMAL LOW (ref >90)
GFR, EST NON AFRICAN AMERICAN: 36 mL/min — AB (ref >90)
Glucose, Bld: 178 mg/dL — ABNORMAL HIGH (ref 70–99)
POTASSIUM: 4 meq/L (ref 3.7–5.3)
Sodium: 139 mEq/L (ref 137–147)
Total Bilirubin: 0.4 mg/dL (ref 0.3–1.2)
Total Protein: 7.2 g/dL (ref 6.0–8.3)

## 2013-08-23 LAB — CBC WITH DIFFERENTIAL/PLATELET
Basophils Absolute: 0.1 10*3/uL (ref 0.0–0.1)
Basophils Relative: 1 % (ref 0–1)
EOS PCT: 3 % (ref 0–5)
Eosinophils Absolute: 0.3 10*3/uL (ref 0.0–0.7)
HEMATOCRIT: 29 % — AB (ref 36.0–46.0)
Hemoglobin: 8.3 g/dL — ABNORMAL LOW (ref 12.0–15.0)
Lymphocytes Relative: 19 % (ref 12–46)
Lymphs Abs: 2 10*3/uL (ref 0.7–4.0)
MCH: 20.4 pg — ABNORMAL LOW (ref 26.0–34.0)
MCHC: 28.6 g/dL — ABNORMAL LOW (ref 30.0–36.0)
MCV: 71.3 fL — ABNORMAL LOW (ref 78.0–100.0)
MONOS PCT: 13 % — AB (ref 3–12)
Monocytes Absolute: 1.3 10*3/uL — ABNORMAL HIGH (ref 0.1–1.0)
NEUTROS PCT: 64 % (ref 43–77)
Neutro Abs: 6.6 10*3/uL (ref 1.7–7.7)
Platelets: 300 10*3/uL (ref 150–400)
RBC: 4.07 MIL/uL (ref 3.87–5.11)
RDW: 19.1 % — ABNORMAL HIGH (ref 11.5–15.5)
WBC: 10.3 10*3/uL (ref 4.0–10.5)

## 2013-08-23 LAB — GLUCOSE, CAPILLARY
Glucose-Capillary: 167 mg/dL — ABNORMAL HIGH (ref 70–99)
Glucose-Capillary: 247 mg/dL — ABNORMAL HIGH (ref 70–99)
Glucose-Capillary: 266 mg/dL — ABNORMAL HIGH (ref 70–99)
Glucose-Capillary: 285 mg/dL — ABNORMAL HIGH (ref 70–99)

## 2013-08-23 LAB — OCCULT BLOOD X 1 CARD TO LAB, STOOL: Fecal Occult Bld: POSITIVE — AB

## 2013-08-23 LAB — MAGNESIUM: MAGNESIUM: 2.1 mg/dL (ref 1.5–2.5)

## 2013-08-23 LAB — PROTIME-INR
INR: 2.15 — ABNORMAL HIGH (ref 0.00–1.49)
Prothrombin Time: 23.3 seconds — ABNORMAL HIGH (ref 11.6–15.2)

## 2013-08-23 MED ORDER — ROSUVASTATIN CALCIUM 20 MG PO TABS
20.0000 mg | ORAL_TABLET | Freq: Every day | ORAL | Status: DC
  Administered 2013-08-23 – 2013-09-04 (×13): 20 mg via ORAL
  Filled 2013-08-23 (×14): qty 1

## 2013-08-23 MED ORDER — INSULIN GLARGINE 100 UNIT/ML ~~LOC~~ SOLN
20.0000 [IU] | Freq: Every day | SUBCUTANEOUS | Status: DC
  Filled 2013-08-23: qty 0.2

## 2013-08-23 MED ORDER — INSULIN GLARGINE 100 UNIT/ML ~~LOC~~ SOLN
40.0000 [IU] | Freq: Every day | SUBCUTANEOUS | Status: DC
  Administered 2013-08-23: 23:00:00 40 [IU] via SUBCUTANEOUS
  Filled 2013-08-23 (×2): qty 0.4

## 2013-08-23 MED ORDER — POLYETHYLENE GLYCOL 3350 17 G PO PACK
17.0000 g | PACK | Freq: Every day | ORAL | Status: DC
  Administered 2013-08-23 – 2013-08-28 (×5): 17 g via ORAL
  Filled 2013-08-23 (×6): qty 1

## 2013-08-23 MED ORDER — INSULIN GLARGINE 100 UNIT/ML ~~LOC~~ SOLN
10.0000 [IU] | SUBCUTANEOUS | Status: AC
  Administered 2013-08-23: 15:00:00 10 [IU] via SUBCUTANEOUS
  Filled 2013-08-23: qty 0.1

## 2013-08-23 MED ORDER — NON FORMULARY: 20.0000 mg | Freq: Every evening | Status: DC

## 2013-08-23 MED ORDER — INSULIN GLARGINE 100 UNIT/ML ~~LOC~~ SOLN
40.0000 [IU] | Freq: Every day | SUBCUTANEOUS | Status: DC
  Filled 2013-08-23: qty 0.4

## 2013-08-23 MED ORDER — BISACODYL 10 MG RE SUPP
10.0000 mg | Freq: Every day | RECTAL | Status: DC | PRN
  Administered 2013-08-23 – 2013-08-31 (×2): 10 mg via RECTAL
  Filled 2013-08-23 (×2): qty 1

## 2013-08-23 NOTE — Progress Notes (Signed)
Patient Name: Victoria Adams Date of Encounter: 08/23/2013  Principal Problem:   Acute on chronic systolic heart failure Active Problems:   CARDIOMYOPATHY, DILATED   Atrial fibrillation   Chronic systolic heart failure   CHF exacerbation   NSTEMI (non-ST elevated myocardial infarction)    Patient Profile:  Victoria Adams is a 73 yo female with PMH significant for chronic low back pain, chronic anemia, IDDM-Type 2, chronic Afib on warfarin, and supposed nonischemic dilated cardiomyopathy/HF (last EF 40-45% in 2014) who was admitted for likely acute on chronic HF decompensation as well as NSTEMI vs mild troponin elevation related to CHF exacerbation.   SUBJECTIVE: Victoria Adams is overall feeling better but is complaining of back pain. She says this pain is the same as she always feels it and would like some pain meds to help relieve it. She denies chest pain, L arm pain, or palpitations. Her breathing is improving and she is no longer on [place]. She is constipated and has not had a BM in 2 days.   OBJECTIVE Filed Vitals:   08/22/13 2005 08/22/13 2021 08/23/13 0001 08/23/13 0450  BP:  124/43 146/44 165/71  Pulse:  71 75 76  Temp:  98.4 F (36.9 C) 98.6 F (37 C) 98 F (36.7 C)  TempSrc:  Oral Oral Oral  Resp:  15 17 15   Height:      Weight:   254 lb 10.1 oz (115.5 kg)   SpO2: 97% 94% 96% 95%    Intake/Output Summary (Last 24 hours) at 08/23/13 0659 Last data filed at 08/23/13 0457  Gross per 24 hour  Intake   1150 ml  Output   3800 ml  Net  -2650 ml   Filed Weights   08/22/13 0257 08/22/13 0325 08/23/13 0001  Weight: 254 lb 10.1 oz (115.5 kg) 254 lb 10.1 oz (115.5 kg) 254 lb 10.1 oz (115.5 kg)    PHYSICAL EXAM General: Well developed, well nourished, female in no acute distress. Head: Normocephalic, atraumatic.  Neck: Supple without bruits, no JVD. Lungs:  Resp regular and unlabored, CTA. Heart: Irregularly irregular rhythm, S1, S2, no S3, S4, or murmur; no  rub. Abdomen: Soft, non-tender, non-distended, BS + x 4.  Extremities: No clubbing, cyanosis, edema.  Neuro: Alert and oriented X 3. Moves all extremities spontaneously. Psych: Normal affect.  LABS: CBC: Recent Labs  08/22/13 0406 08/23/13 0415  WBC 11.5* 10.3  NEUTROABS 6.7 6.6  HGB 8.6* 8.3*  HCT 29.7* 29.0*  MCV 71.2* 71.3*  PLT 342 300   INR: Recent Labs  08/23/13 0415  INR 5.39*   Basic Metabolic Panel: Recent Labs  08/22/13 0406 08/23/13 0415  NA 139 139  K 4.1 4.0  CL 98 98  CO2 25 29  GLUCOSE 66* 178*  BUN 22 21  CREATININE 1.29* 1.42*  CALCIUM 9.1 9.0  MG 2.0 2.1   Liver Function Tests: Recent Labs  08/22/13 0406 08/23/13 0415  AST 32 31  ALT 14 13  ALKPHOS 83 84  BILITOT 0.3 0.4  PROT 7.2 7.2  ALBUMIN 3.1* 3.2*   Cardiac Enzymes: Recent Labs  08/22/13 0406 08/22/13 1038 08/22/13 1600  TROPONINI 1.45* 4.17* 6.71*   BNP: Pro B Natriuretic peptide (BNP)  Date/Time Value Ref Range Status  08/22/2013  4:06 AM 1368.0* 0 - 125 pg/mL Final  06/15/2008  4:34 AM 326.0* 0.0 - 100.0 pg/mL Final   Anemia Panel: Recent Labs  08/22/13 0406  FERRITIN 6*  TIBC 366  IRON 13*    TELE:  Afib, rate in the 80s  Radiology/Studies: Dg Sacrum/coccyx  08/22/2013   CLINICAL DATA:  Fall 2 weeks ago, pain.  EXAM: SACRUM AND COCCYX - 2+ VIEW  COMPARISON:  None.  FINDINGS: There is no evidence of fracture or other focal bone lesions. Moderate vascular calcifications.  IMPRESSION: Negative.   Electronically Signed   By: Elon Alas   On: 08/22/2013 04:46   Dg Hip Bilateral W/pelvis  08/22/2013   CLINICAL DATA:  Fall 2 weeks ago with persistent pain.  EXAM: BILATERAL HIP WITH PELVIS - 4+ VIEW  COMPARISON:  None.  FINDINGS: Femoral heads are well formed and located. Hip joint spaces are intact. Sacroiliac joints are symmetric.  No destructive bony lesions. Included soft tissue planes are non-suspicious. Moderate vascular calcifications.  IMPRESSION: No  acute fracture deformity or dislocation.   Electronically Signed   By: Elon Alas   On: 08/22/2013 04:47     Current Medications:  . antiseptic oral rinse  15 mL Mouth Rinse BID  . aspirin EC  81 mg Oral Daily  . atorvastatin  80 mg Oral q1800  . carvedilol  25 mg Oral BID WC  . furosemide  60 mg Intravenous BID  . insulin aspart  0-15 Units Subcutaneous TID WC  . insulin aspart  0-5 Units Subcutaneous QHS  . insulin glargine  40 Units Subcutaneous BID  . lisinopril  20 mg Oral Daily  . nitroGLYCERIN  0.5 inch Topical 4 times per day  . pantoprazole  40 mg Oral Daily  . sodium chloride  3 mL Intravenous Q12H  . venlafaxine  150 mg Oral BID WC      ASSESSMENT AND PLAN: Principal Problem:   Acute on chronic systolic heart failure Active Problems:   CARDIOMYOPATHY, DILATED   Atrial fibrillation   Chronic systolic heart failure   CHF exacerbation   NSTEMI (non-ST elevated myocardial infarction)   1. Acute on chronic heart failure decompensation: BNP elevated to 1368. Net I/O since admission -3250. Patient appears dry on exam although her weight is unchanged. She is breathing comfortably on RA.  -Hold Lasix as her volume status has improved -Continue daily weights and strict I/O -Check 2D Echo (has been ordered)  -Continue BB, ACEI. Digoxin d/c on admission, resume per MD.  - Resume by mouth Lasix when appropriate, possibly in the a.m.  2. NSTEMI: INR is still > 2 so will need to wait for it to reach <1.7 before cardiac cath. Patient is cardiac stable on medical therapy, cath when INR appropriate, may be Monday. Need to rule out occult bleeding (see below).  -Hold warfarin, no heparin at this time, continue to monitor INR  -Continue nitro patch -Plan for diagnostic LHC +/- PCI once INR <1.7 -Continue ASA, BB, ACE, statin   3. Iron deficiency anemia: Patient states that her PCP diagnosed her with iron deficiency anemia about 2 months ago and she has been on iron  supplements bid since then. Patient aware we need a stool sample to rule out occult bleeding before putting her on antiplatelet therapy for cardiac cath tomorrow. She agreed to try Miralax/Colace first today to try to produce a sample on her own. If not, we will get a digital sample.  -Miralax 17mg  packet daily  -Suppository as needed  4. Anticoagulation for chronic Afib: Ventricular rate is controlled in the 70-80s. Continue BB for rate control.  -Hold warfarin to pursue LHC  5. Elevated D-dimer: Patient has  been therapeutic on warfarin so elevated D-dimer likely due to another inflammatory process rather than PE.   6. Sacral/back pain: Pelvic and sacral Xray both negative for fracture. Patient still endorses back pain.  -Continue Norco as needed for pain  7. DVT PPx: Can initiate heparin when INR <2.0  Rondel Baton, PA-S  Seen and agree, changes made Signed, Lonn Georgia , PA-C 6:59 AM 08/23/2013

## 2013-08-23 NOTE — Progress Notes (Signed)
Pt. Seen and examined. Agree with the NP/PA-C note as written.  INR still too high for cath - she has a chronic anemia, suspect may be due to occult blood loss on warfarin. Check stool guaiacs today if she has a BM - may need encouragement. Chest pain has basically resolved. Troponin peak at 6 - may need to trend further. Transition to heparin when INR <2.  May be able to do a radial cath if INR <2, depending on provider. Keep NPO p MN in case we wish to try tomorrow - but more than likely it will be Monday.  Victoria Casino, MD, [facility] Attending Cardiologist [place]

## 2013-08-23 NOTE — Progress Notes (Signed)
Had lengthy discussion with patient and sisters who are nurses about blood sugars and insulin.  Lantus has been cut back significantly due to recent drops in blood sugar.  Pt had been taking 90 units in am and 70 units as HS.  Mickel Baas RN had discussion with Rosaria Ferries PA today and states plan was to give 10 units in afternoon and 40 units tonight.  Has been covered w/ SS insulin each meal today for CBG>200.  No order for lantus insulin on MAR tonight, will be NPO tonight .  Pt and family concerned about sugars being so high while metformin is on hold.  CBG 285.  Dr Aundra Dubin notified, order received to give 40 units tonight.  Will monitor sugar as needed. Pt states she can tell when her sugar drops and will call me with any symptoms.

## 2013-08-23 NOTE — Progress Notes (Signed)
Notified Melina Copa PA that patient reported taken lipitor in the past with intolerance presenting as muscle aches. Lipitor had been changed to Crestor at that time and tolerated well. Dayna will change the patients lipitor to crestor. Patient informed.

## 2013-08-23 NOTE — Plan of Care (Signed)
Problem: Consults Goal: Heart Failure Patient Education (See Patient Education module for education specifics.)  Outcome: Completed/Met Date Met:  08/23/13 CHF and MI education booklets given yesterday.   Discussed medications, daily weights, when to call MD, S/S angina and worsening CHF, and heart healthy diabetic diet.  Pt initially resistant to discussion about MI and CHF, but more receptive today.  Pt has two supportive sisters who are nurses at bedside encouraging her.

## 2013-08-23 NOTE — Progress Notes (Signed)
  Echocardiogram 2D Echocardiogram has been performed.  Victoria Adams 08/23/2013, 11:09 AM

## 2013-08-23 NOTE — Progress Notes (Signed)
ANTICOAGULATION CONSULT NOTE - Follow-up Consult  Pharmacy Consult for heparin when INR < 2 Indication: atrial fibrillation  Allergies  Allergen Reactions  . Codeine Nausea And Vomiting  . Tylox [Oxycodone-Acetaminophen]     Vision closing in & heart palpitations.     Patient Measurements: Height: 5\' 7"  (170.2 cm) Weight: 254 lb 10.1 oz (115.5 kg) IBW/kg (Calculated) : 61.6 Heparin Dosing Weight: 89 kg  Vital Signs: Temp: 98 F (36.7 C) (05/14 0450) Temp src: Oral (05/14 0450) BP: 165/71 mmHg (05/14 0450) Pulse Rate: 76 (05/14 0450)  Labs:  Recent Labs  08/22/13 0406 08/22/13 1038 08/22/13 1600 08/23/13 0415  HGB 8.6*  --   --  8.3*  HCT 29.7*  --   --  29.0*  PLT 342  --   --  300  LABPROT 24.4*  --   --  23.3*  INR 2.28*  --   --  2.15*  CREATININE 1.29*  --   --  1.42*  TROPONINI 1.45* 4.17* 6.71*  --     Estimated Creatinine Clearance: 47.7 ml/min (by C-G formula based on Cr of 1.42).  Assessment: 73 yo female with afib came in with elevated troponin will be switched from coumadin to heparin when INR is <2.  Patient will go to cath lab when INR <1.7. INR 2.15 this a.m.  Goal of Therapy:  Heparin level 0.3-0.7 units/ml Monitor platelets by anticoagulation protocol: Yes   Plan:  1) F/u a.m. INR. Will f/u sooner if MD reverses INR.  Sherlon Handing, PharmD, BCPS Clinical pharmacist, pager [phone] 08/23/2013,5:21 AM

## 2013-08-24 ENCOUNTER — Encounter (HOSPITAL_COMMUNITY)
Admission: EM | Disposition: A | Discharge status: Home Health | Payer: Self-pay | Source: Other Acute Inpatient Hospital | Attending: Cardiology

## 2013-08-24 ENCOUNTER — Encounter (HOSPITAL_COMMUNITY): Payer: Self-pay | Admitting: Physician Assistant

## 2013-08-24 DIAGNOSIS — R195 Other fecal abnormalities: Secondary | ICD-10-CM

## 2013-08-24 DIAGNOSIS — I2510 Atherosclerotic heart disease of native coronary artery without angina pectoris: Secondary | ICD-10-CM

## 2013-08-24 DIAGNOSIS — I5023 Acute on chronic systolic (congestive) heart failure: Secondary | ICD-10-CM | POA: Diagnosis not present

## 2013-08-24 DIAGNOSIS — D509 Iron deficiency anemia, unspecified: Secondary | ICD-10-CM | POA: Diagnosis not present

## 2013-08-24 DIAGNOSIS — I447 Left bundle-branch block, unspecified: Secondary | ICD-10-CM | POA: Diagnosis not present

## 2013-08-24 DIAGNOSIS — I4891 Unspecified atrial fibrillation: Secondary | ICD-10-CM | POA: Diagnosis not present

## 2013-08-24 HISTORY — PX: LEFT HEART CATHETERIZATION WITH CORONARY ANGIOGRAM: SHX5451

## 2013-08-24 LAB — BASIC METABOLIC PANEL
BUN: 24 mg/dL — ABNORMAL HIGH (ref 6–23)
CALCIUM: 9.1 mg/dL (ref 8.4–10.5)
CO2: 29 meq/L (ref 19–32)
Chloride: 94 mEq/L — ABNORMAL LOW (ref 96–112)
Creatinine, Ser: 1.39 mg/dL — ABNORMAL HIGH (ref 0.50–1.10)
GFR calc Af Amer: 43 mL/min — ABNORMAL LOW (ref >90)
GFR, EST NON AFRICAN AMERICAN: 37 mL/min — AB (ref >90)
Glucose, Bld: 203 mg/dL — ABNORMAL HIGH (ref 70–99)
POTASSIUM: 4.2 meq/L (ref 3.7–5.3)
SODIUM: 134 meq/L — AB (ref 137–147)

## 2013-08-24 LAB — CBC WITH DIFFERENTIAL/PLATELET
Basophils Absolute: 0.1 10*3/uL (ref 0.0–0.1)
Basophils Relative: 1 % (ref 0–1)
EOS PCT: 4 % (ref 0–5)
Eosinophils Absolute: 0.3 10*3/uL (ref 0.0–0.7)
HCT: 28.1 % — ABNORMAL LOW (ref 36.0–46.0)
HEMOGLOBIN: 8.3 g/dL — AB (ref 12.0–15.0)
LYMPHS ABS: 2.1 10*3/uL (ref 0.7–4.0)
Lymphocytes Relative: 26 % (ref 12–46)
MCH: 20.9 pg — AB (ref 26.0–34.0)
MCHC: 29.5 g/dL — ABNORMAL LOW (ref 30.0–36.0)
MCV: 70.6 fL — AB (ref 78.0–100.0)
MONO ABS: 1 10*3/uL (ref 0.1–1.0)
Monocytes Relative: 12 % (ref 3–12)
Neutro Abs: 4.6 10*3/uL (ref 1.7–7.7)
Neutrophils Relative %: 57 % (ref 43–77)
Platelets: 273 10*3/uL (ref 150–400)
RBC: 3.98 MIL/uL (ref 3.87–5.11)
RDW: 18.7 % — ABNORMAL HIGH (ref 11.5–15.5)
WBC: 8.1 10*3/uL (ref 4.0–10.5)

## 2013-08-24 LAB — GLUCOSE, CAPILLARY
Glucose-Capillary: 212 mg/dL — ABNORMAL HIGH (ref 70–99)
Glucose-Capillary: 175 mg/dL — ABNORMAL HIGH (ref 70–99)
Glucose-Capillary: 257 mg/dL — ABNORMAL HIGH (ref 70–99)
Glucose-Capillary: 274 mg/dL — ABNORMAL HIGH (ref 70–99)

## 2013-08-24 LAB — PROTIME-INR
INR: 1.91 — ABNORMAL HIGH (ref 0.00–1.49)
PROTHROMBIN TIME: 21.3 s — AB (ref 11.6–15.2)

## 2013-08-24 LAB — MAGNESIUM: Magnesium: 2.2 mg/dL (ref 1.5–2.5)

## 2013-08-24 SURGERY — LEFT HEART CATHETERIZATION WITH CORONARY ANGIOGRAM
Anesthesia: LOCAL

## 2013-08-24 MED ORDER — MIDAZOLAM HCL 2 MG/2ML IJ SOLN
INTRAMUSCULAR | Status: AC
  Filled 2013-08-24: qty 2

## 2013-08-24 MED ORDER — HEPARIN (PORCINE) IN NACL 2-0.9 UNIT/ML-% IJ SOLN
INTRAMUSCULAR | Status: AC
  Filled 2013-08-24: qty 1500

## 2013-08-24 MED ORDER — FENTANYL CITRATE 0.05 MG/ML IJ SOLN
INTRAMUSCULAR | Status: AC
  Filled 2013-08-24: qty 2

## 2013-08-24 MED ORDER — LIDOCAINE HCL (PF) 1 % IJ SOLN
INTRAMUSCULAR | Status: AC
  Filled 2013-08-24: qty 30

## 2013-08-24 MED ORDER — HEPARIN SODIUM (PORCINE) 1000 UNIT/ML IJ SOLN
INTRAMUSCULAR | Status: AC
  Filled 2013-08-24: qty 1

## 2013-08-24 MED ORDER — SODIUM CHLORIDE 0.9 % IV SOLN: INTRAVENOUS | Status: DC

## 2013-08-24 MED ORDER — INSULIN GLARGINE 100 UNIT/ML ~~LOC~~ SOLN
10.0000 [IU] | SUBCUTANEOUS | Status: AC
  Administered 2013-08-24: 10 [IU] via SUBCUTANEOUS
  Filled 2013-08-24: qty 0.1

## 2013-08-24 MED ORDER — SODIUM CHLORIDE 0.9 % IV SOLN: 1.0000 mL/kg/h | INTRAVENOUS | Status: AC

## 2013-08-24 MED ORDER — HEPARIN (PORCINE) IN NACL 100-0.45 UNIT/ML-% IJ SOLN
1350.0000 [IU]/h | INTRAMUSCULAR | Status: DC
  Filled 2013-08-24: qty 250

## 2013-08-24 MED ORDER — INSULIN GLARGINE 100 UNIT/ML ~~LOC~~ SOLN
50.0000 [IU] | Freq: Every day | SUBCUTANEOUS | Status: DC
  Administered 2013-08-24 – 2013-09-04 (×12): 50 [IU] via SUBCUTANEOUS
  Filled 2013-08-24 (×13): qty 0.5

## 2013-08-24 MED ORDER — VERAPAMIL HCL 2.5 MG/ML IV SOLN
INTRAVENOUS | Status: AC
  Filled 2013-08-24: qty 2

## 2013-08-24 MED ORDER — NITROGLYCERIN 0.2 MG/ML ON CALL CATH LAB
INTRAVENOUS | Status: AC
  Filled 2013-08-24: qty 1

## 2013-08-24 MED ORDER — ASPIRIN EC 81 MG PO TBEC
81.0000 mg | DELAYED_RELEASE_TABLET | Freq: Every day | ORAL | Status: DC
  Administered 2013-08-25 – 2013-08-31 (×7): 81 mg via ORAL
  Filled 2013-08-24 (×8): qty 1

## 2013-08-24 MED ORDER — INSULIN GLARGINE 100 UNIT/ML ~~LOC~~ SOLN
20.0000 [IU] | Freq: Every day | SUBCUTANEOUS | Status: DC
  Administered 2013-08-26 – 2013-09-05 (×11): 20 [IU] via SUBCUTANEOUS
  Filled 2013-08-24 (×13): qty 0.2

## 2013-08-24 NOTE — Progress Notes (Signed)
Patient Name: Victoria Adams Date of Encounter: 08/24/2013  Principal Problem:   Acute on chronic systolic heart failure Active Problems:   CARDIOMYOPATHY, DILATED   Atrial fibrillation   Chronic systolic heart failure   CHF exacerbation   NSTEMI (non-ST elevated myocardial infarction)    Patient Profile: 73 yo female with PMH significant for chronic low back pain, chronic anemia, IDDM-Type 2, chronic Afib on warfarin, and supposed nonischemic dilated cardiomyopathy/HF (last EF 40-45% in 2014) who was admitted 05/13 for likely acute on chronic HF decompensation as well as NSTEMI vs mild troponin elevation related to CHF exacerbation.   SUBJECTIVE: Pt without chest pain or SOB. Stools dark since being started on iron, rare BRBPR. Chronic constipation and diarrhea at times, mostly constipation.   OBJECTIVE Filed Vitals:   08/23/13 1617 08/23/13 2027 08/24/13 0003 08/24/13 0420  BP: 162/84 142/42 114/38 154/79  Pulse: 79 72 71 75  Temp: 98.5 F (36.9 C) 98 F (36.7 C) 98 F (36.7 C) 97.7 F (36.5 C)  TempSrc: Oral Oral Oral Oral  Resp: 16 16 17 16   Height:      Weight:   256 lb 2.8 oz (116.2 kg)   SpO2: 94% 94% 92% 97%    Intake/Output Summary (Last 24 hours) at 08/24/13 0622 Last data filed at 08/23/13 2300  Gross per 24 hour  Intake   1140 ml  Output    401 ml  Net    739 ml   Filed Weights   08/22/13 0325 08/23/13 0001 08/24/13 0003  Weight: 254 lb 10.1 oz (115.5 kg) 254 lb 10.1 oz (115.5 kg) 256 lb 2.8 oz (116.2 kg)    PHYSICAL EXAM General: Well developed, well nourished, female in no acute distress. Head: Normocephalic, atraumatic.  Neck: Supple without bruits, JVD slightly elevated. Lungs:  Resp regular and unlabored, CTA. Heart: Irregular, S1, S2, no S3, S4, or murmur; no rub. Abdomen: Soft, non-tender, non-distended, BS + x 4.  Extremities: No clubbing, cyanosis, no edema.  Neuro: Alert and oriented X 3. Moves all extremities spontaneously. Psych:  Normal affect.  LABS: CBC: Recent Labs  08/23/13 0415 08/24/13 0357  WBC 10.3 8.1  NEUTROABS 6.6 4.6  HGB 8.3* 8.3*  HCT 29.0* 28.1*  MCV 71.3* 70.6*  PLT 300 273   INR: Recent Labs  08/24/13 0357  INR 5.36*   Basic Metabolic Panel: Recent Labs  08/23/13 0415 08/24/13 0357  NA 139 134*  K 4.0 4.2  CL 98 94*  CO2 29 29  GLUCOSE 178* 203*  BUN 21 24*  CREATININE 1.42* 1.39*  CALCIUM 9.0 9.1  MG 2.1 2.2   Liver Function Tests: Recent Labs  08/22/13 0406 08/23/13 0415  AST 32 31  ALT 14 13  ALKPHOS 83 84  BILITOT 0.3 0.4  PROT 7.2 7.2  ALBUMIN 3.1* 3.2*   Cardiac Enzymes: Recent Labs  08/22/13 0406 08/22/13 1038 08/22/13 1600  TROPONINI 1.45* 4.17* 6.71*   BNP: Pro B Natriuretic peptide (BNP)  Date/Time Value Ref Range Status  08/22/2013  4:06 AM 1368.0* 0 - 125 pg/mL Final  06/15/2008  4:34 AM 326.0* 0.0 - 100.0 pg/mL Final   Anemia Panel: Recent Labs  08/22/13 0406  FERRITIN 6*  TIBC 366  IRON 13*   Lab Results  Component Value Date   OCCULTBLD POSITIVE* 08/23/2013    TELE: atrial fib, rate generally controlled.    ECHO: 08/23/2013 - Left ventricle: The cavity size was mildly dilated. Wall thickness  was increased in a pattern of moderate LVH. Systolic function was severely reduced. The estimated ejection fraction was in the range of 20% to 25%. Diffuse hypokinesis. Severe hypokinesis of the entire myocardium. - Aortic valve: Mildly calcified annulus. - Left atrium: The atrium was severely dilated. - Right atrium: The atrium was mildly dilated.   Current Medications:  . aspirin EC  81 mg Oral Daily  . carvedilol  25 mg Oral BID WC  . insulin aspart  0-15 Units Subcutaneous TID WC  . insulin aspart  0-5 Units Subcutaneous QHS  . insulin glargine  20 Units Subcutaneous Daily  . insulin glargine  40 Units Subcutaneous QHS  . lisinopril  20 mg Oral Daily  . nitroGLYCERIN  0.5 inch Topical 4 times per day  . pantoprazole  40 mg Oral  Daily  . polyethylene glycol  17 g Oral Daily  . rosuvastatin  20 mg Oral q1800  . sodium chloride  3 mL Intravenous Q12H  . venlafaxine  150 mg Oral BID WC   . heparin      ASSESSMENT AND PLAN:  1. Acute on chronic heart failure decompensation: BNP 1368. Net I/O -2511. Patient appears euvolemic on exam, her weight is up slightly. She is breathing comfortably on RA.  -Hold Lasix as her volume status has improved  -Continue daily weights and strict I/O  -2D Echo results above, significant LVD  -Continue BB, ACEI. Digoxin d/c on admission, resume per MD.  - Resume by mouth Lasix when appropriate, possibly in the a.m.   2. NSTEMI: INR subtherapeutic but FOB+, MD advise on cardiac cath. Could do cors only to assess CAD. Patient is cardiac stable on medical therapy.  -Heparin ordered, has not been started, continue to monitor INR  -Continue nitro patch  -Plan for diagnostic LHC +/- PCI once pt medically stable  -Continue ASA, BB, ACE, statin   3. Iron deficiency anemia: Patient states that her PCP diagnosed her with iron deficiency anemia about 2 months ago and she has been on iron supplements bid since then.  - continue supplement - suspect GI blood loss  4. Chronic Afib: Ventricular rate is controlled in the 70-80s. Continue BB for rate control.  -Hold warfarin to pursue LHC   5. Elevated D-dimer: Patient has been therapeutic on warfarin so further evaluation not needed.   6. Sacral/back pain: Pelvic and sacral Xray both negative for fracture. Patient still endorses back pain.  -Continue Norco as needed for pain   7. Anticoagulation: Warfarin on hold. Heparin ordered for when INR <2.0, RN has not started it yet, MD to review data and advise.  8.Guaiac positive stool: MD review and advise, may need GI to see. Has problems with constipation, probably worse since on Iron -Miralax 17mg  packet daily  -Suppository as needed    Principal Problem:   Acute on chronic systolic heart  failure Active Problems:   CARDIOMYOPATHY, DILATED   Atrial fibrillation   Chronic systolic heart failure   CHF exacerbation   NSTEMI (non-ST elevated myocardial infarction)   SignedLonn Georgia , PA-C 6:22 AM 08/24/2013

## 2013-08-24 NOTE — CV Procedure (Signed)
CARDIAC CATHETERIZATION REPORT  NAME:  Victoria Adams   MRN: [id number] DOB:  [birth year]   ADMIT DATE: 08/22/2013 Procedure Date: 08/24/2013  INTERVENTIONAL CARDIOLOGIST: Leonie Man, M.D., MS PRIMARY CARE PROVIDER: Gar Ponto, MD PRIMARY CARDIOLOGIST: Johnny Bridge, M.D.  PATIENT:  Victoria Adams is a 73 y.o. female with a history of nonischemic cardiomyopathy (EF 40-45%), type 2 diabetes on insulin, hypertension, chronic A. fib on warfarin therapy. She is followed by Dr. Domenic Polite at the [facility] office. She was admitted the evening of May 13 2 [facility] in transfer from [place] at present he noted severe dyspnea and chest discomfort. Her troponin level was mildly elevated, she eventually ruled in with troponins of greater than 6. She is also noted to have significant anemia with guaiac positive stools. She's been stabilized medically and has had her warfarin held. She is now referred for diagnostic catheterization to delineate her coronary anatomy in preparation for GI consult with EGD and colonoscopy.  PRE-OPERATIVE DIAGNOSIS:    Non-STEMI  Nonischemic Cardiomyopathy  Chronic A. fib  PROCEDURES PERFORMED:    Left Heart Catheterization with Coronary Angiography  PROCEDURE:Consent:  Risks of procedure as well as the alternatives and risks of each were explained to the (patient/caregiver).  Consent for procedure obtained. Consent for signed by MD and patient with RN witness -- placed on chart.   PROCEDURE: The patient was brought to the [facility]nd [facility] in the fasting state and prepped and draped in the usual sterile fashion for Right groin or radial access. A modified Allen's test with plethysmography was performed, revealing excellent Ulnar artery collateral flow.  Sterile technique was used including antiseptics, cap, gloves, gown, hand hygiene, mask and sheet.  Skin prep: Chlorhexidine.  Time Out: Verified patient  identification, verified procedure, site/side was marked, verified correct patient position, special equipment/implants available, medications/allergies/relevent history reviewed, required imaging and test results available.  Performed  Access: Right Radial Artery; 6 Fr Sheath -- Seldinger technique (Angiocath Micropuncture Kit)  IA Radial Cocktail, IV Heparin 5000 Units Diagnostic Left Heart Catheterization with Coronary Angiography:  5 Fr TIG 4.0 & Angled Pigtail) catheters advanced and exchanged over Long Exchange Safety J-wire into the ascending aorta and used for selective coronary artery engagement.  Left and Right Coronary Artery Angiography: TIG 4.0  LV Hemodynamics (LV Gram): Angled pigtail  TR Band:  1330 Hours, 11 mL air  Hemodynamics:  Central Aortic / Mean Pressures: 103/52/89 mmHg  Left Ventricular Pressures / EDP: 103/10/15 mmHg  Left Ventriculography:  EF: 40 and 45% with global hypokinesis  Coronary Anatomy: There is at least moderate calcification in the left main as well as proximal to mid LAD and Circumflex.  Dominance: Left  Left Main: Large-caliber, short vessel that bifurcates into the LAD and Circumflex. LAD: Large-caliber vessel gives rise to a very proximal D1.  Following this there is a long ring 40-60% stenosis (60% being the most significant distally at the take off of a septal perforator - SP2).   Just prior to the more notable tapering, there is an ostial 80-90% stenosis in the first major septal perforator - SP 1.  In the mid vessel there is a smaller diagonal branch is relatively insignificant. At that point the vessel actually covers relatively normal. In courses down around the apex giving rise several smaller branches. There is actually very distal diagonal branch at the apex. There is no other significant lesions beyond the mid vessel.   It  is quite possible that the aggregate stenoses in the mid LAD could potentially be within significant, would  require a greater than 30 mm segment of stent.  D1: Moderate caliber vessel that has a very proximal branch and then bifurcates distally. Tortuous but minimal luminal irregularities.  Left Circumflex: Large caliber, dominant vessel with an ostial 9010% lesion. There is extensive calcification in the mid vessel/AV groove region. There is at least 2 tiny marginal branches prior to the vessel essentially trifurcating into OM 1, OM 2 and the follow on AV groove circumflex. Angiographically normal other than calcified.  OM1: Moderate-large caliber, tortuous vessel; intravesically normal.  OM 2: Begins as a moderate large-caliber vessel and trifurcates itself into 3 small to moderate caliber branches the would be considered more consistent with left posterior lateral branches   The AV Groove Circumflex continues as a moderate to large caliber vessel gives rise to a small-moderate caliber left posterior descending artery. Just at the distal band before this vessel becomes the PDA, there is a tubular eccentric roughly 50% stenosis.   RCA: Very small nondominant with 2 marginal branches   After reviewing the initial angiography, no obvious culprit lesion was identified.  The only potential culprit for her MI in the setting of anemia could be the septal perforator lesion versus the long moderate stenosis in the mid LAD.   MEDICATIONS:  Anesthesia:  Local Lidocaine 2 ml  Sedation:  1 mg IV Versed, 25 mcg IV fentanyl ;   Omnipaque Contrast: 80 ml  Anticoagulation:  IV Heparin 5000 Units Radial Cocktail: 5 mg Verapamil, 400 mcg NTG, 2 ml 2% Lidocaine in 10 ml NS  PATIENT DISPOSITION:    The patient was transferred to the PACU holding area in a hemodynamicaly stable, chest pain free condition.  The patient tolerated the procedure well, and there were no complications.  EBL:   < 10 ml  The patient was stable before, during, and after the procedure.  POST-OPERATIVE DIAGNOSIS:    Moderate  diffuse mid LAD disease of 40-60% that could composite be hemodynamically significant.  There is also a major Septal perforator branch with ostial ~90%.  Moderately reduced LVEF with relatively normal to mildly elevated EDP  PLAN OF CARE:  The patient will return to the nursing unit for ongoing care with standard post radial cath care.  We will continue to hold IV heparin pending GI evaluation.   At this point, the best recommendation for management would be medical therapy for her moderate LAD disease. Medical therapy would be preferred especially in this patient with getting calcification, and his, chronic A. fib on warfarin therapy who also has a GI bleed.  I've discussed the results with Dr. Debara Pickett MI who is the rounding team physician. The note will be forwarded to the patient's primary cardiologist.   Leonie Man, M.D., M.S.   [address] [place], [place]  [address]  [phone]  08/24/2013 1:36 PM

## 2013-08-24 NOTE — Progress Notes (Signed)
HCT dropped, stool + heme, INR 1.9, Discussed w/ Rosaria Ferries PA and Pharmacist Marya Amsler.  Suanne Marker advised to hold off starting heparin for now until MD advises further.

## 2013-08-24 NOTE — Interval H&P Note (Signed)
History and Physical Interval Note:  08/24/2013 12:50 PM  NSTEMI troponin to 6. Now chest pain free. She has a microcytic anemia - guaiac positive. D/W GI .Marland Kitchen They would not likely do colonoscopy with her recent NSTEMI. She has had constipation and diarrhea over several months, this concerning for possibly a colonic mass. Would not like to perform PCI if there is possible bleeding risk. Dr. Debara Pickett recommend a diagnostic LHC from a radial approach to hopefully we can define her anatomy and that may help define her risk for GI work-up. Will consult GI today.  Victoria Adams  has presented today for surgery, with the diagnosis of NSTEMI.  The various methods of treatment have been discussed with the patient and family. After consideration of risks, benefits and other options for treatment, the patient has consented to  Procedure(s): LEFT HEART CATHETERIZATION WITH CORONARY ANGIOGRAM (N/A) as a surgical intervention .  The patient's history has been reviewed, patient examined, no change in status, stable for surgery.  I have reviewed the patient's chart and labs.  Questions were answered to the patient's satisfaction.     Leonie Man

## 2013-08-24 NOTE — Progress Notes (Signed)
Pt. Seen and examined. Agree with the NP/PA-C note as written.  NSTEMI troponin to 6. Now chest pain free. She has a microcytic anemia - guaiac positive. D/W GI .Marland Kitchen They would not likely do colonoscopy with her recent NSTEMI. She has had constipation and diarrhea over several months, this concerning for possibly a colonic mass. Would not like to perform PCI if there is possible bleeding risk. Would recommend a diagnostic LHC from a radial approach - d/w Dr. Ellyn Hack - hopefully we can define her anatomy and that may help define her risk for GI work-up,.  Will consult GI today.  Pixie Casino, MD, [facility] Attending Cardiologist [place]

## 2013-08-24 NOTE — Progress Notes (Signed)
[place] for heparin  Indication: atrial fibrillation  Allergies  Allergen Reactions  . Codeine Nausea And Vomiting  . Tylox [Oxycodone-Acetaminophen]     Vision closing in & heart palpitations.     Patient Measurements: Height: 5\' 7"  (170.2 cm) Weight: 256 lb 2.8 oz (116.2 kg) IBW/kg (Calculated) : 61.6 Heparin Dosing Weight: 89 kg  Vital Signs: Temp: 97.7 F (36.5 C) (05/15 0420) Temp src: Oral (05/15 0420) BP: 154/79 mmHg (05/15 0420) Pulse Rate: 75 (05/15 0420)  Labs:  Recent Labs  08/22/13 0406 08/22/13 1038 08/22/13 1600 08/23/13 0415 08/24/13 0357  HGB 8.6*  --   --  8.3* 8.3*  HCT 29.7*  --   --  29.0* 28.1*  PLT 342  --   --  300 273  LABPROT 24.4*  --   --  23.3* 21.3*  INR 2.28*  --   --  2.15* 1.91*  CREATININE 1.29*  --   --  1.42*  --   TROPONINI 1.45* 4.17* 6.71*  --   --     Estimated Creatinine Clearance: 47.8 ml/min (by C-G formula based on Cr of 1.42).  Assessment: 73 yo female with NSTEMI, h/o Afib and Coumadin on hold while awaiting cath, for heparin  Goal of Therapy:  Heparin level 0.3-0.7 units/ml Monitor platelets by anticoagulation protocol: Yes   Plan:  Start heparin 1350 units/hr Check heparin level in 8 hours.  Phillis Knack, PharmD, BCPS  08/24/2013,4:40 AM

## 2013-08-24 NOTE — Progress Notes (Addendum)
Inpatient Diabetes Program Recommendations  AACE/ADA: New Consensus Statement on Inpatient Glycemic Control (2013)  Target Ranges:  Prepandial:   less than 140 mg/dL      Peak postprandial:   less than 180 mg/dL (1-2 hours)      Critically ill patients:  140 - 180 mg/dL     Results for Victoria Adams, Victoria Adams (MRN [id number]) as of 08/24/2013 08:16  Ref. Range 08/23/2013 08:23 08/23/2013 12:24 08/23/2013 17:42 08/23/2013 21:36  Glucose-Capillary Latest Range: 70-99 mg/dL 167 (H) 247 (H) 266 (H) 285 (H)    Results for Victoria Adams, Victoria Adams (MRN [id number]) as of 08/24/2013 08:16  Ref. Range 08/24/2013 07:42  Glucose-Capillary Latest Range: 70-99 mg/dL 212 (H)    Home DM Meds:  Lantus 90 units AM/ 70 units PM Metformin 1000 mg bid    Note patient is NPO again today for possible radial heart cath today.  Patient received 10 units Lantus yesterday at 2:30 pm and then 40 units Lantus last night at 11pm.  Note that patient is scheduled to receive 20 units Lantus this AM and 40 units Lantus tonight.   MD- Once patient resumes diet, please consider the following:  1. Add Novolog 6 units tid with meals (since Metformin needs to be on hold) 2. Titrate Lantus upward slowly as needed to maintain fasting CBG WNL   Will follow Wyn Quaker RN, MSN, CDE Diabetes Coordinator Inpatient Diabetes Program Team Pager: [phone] (8a-10p)

## 2013-08-24 NOTE — Consult Note (Signed)
[facility] Consult: 9:52 AM 08/24/2013  LOS: 2 days    Referring Provider: Dr Debara Pickett.  Primary Care Physician:  Gar Ponto, MD Primary Gastroenterologist:  Althia Forts.     Reason for Consultation:  Microcytic anemia.    HPI: Victoria Adams is a 73 y.o. female.  PMHx of type 2 IDDM, chronic low back pain, low physical functional status, chronic anemia, IDDM-Type 2, HTN, chronic AF on warfarin, dilated CMO with EF=40-45% per 2014 Echo (thought to be nonischemic).  NASH. On first colonoscopy had polyps, her second colonoscopy within last 4 years showed no polyps.  No previous EGD but diagnosed with duodenal ulcer and H Pylori about 2000.  On Omeprazole since. H Pylori was treated.   Admitted on transfer from [facility] 5/13 with dyspnea, increased edema, CP radiating to left arm.   Troponins peaked at 6, ? nonSTEMI vs sequela of acute heart failure.  Waiting on INR to drop before cath.  Likely will be done on 5/18.  Stool is FOBT +.  Hgb is 8.3, was 12.3 in 2010.  MCV low at 71. INR 2.2, but down to 1.9 without vitamin K or FFP. Started on po Iron by PMD 3 months ago.  Takes Omeprazole faithfully, still has occasional nausea and non-bloody emesis.  appetite good, no weight loss.  No dysphagia.  No NSAIDS except infrequent ASA;   Has life long problems with constipation.  May go a week or more between BMs.  Requires laxatives to have BMs 2 to 3 times per week.  Miralax used to help, but lately ineffective.  Now using MOM.  Mag citrate effective, but makes her feel awful. Had small BM after rectal suppository yesterday which was FOBT+.  Occasional scant BPR always after hard BM.  Last month saw more blood than usual on one occasion.  Never blood in emesis.    ROS: [place] a few days PTA but not syncopal.  This led to worsening back pain.  CBG of 40 a few days PTA, self treated with juice.  Chronic urinary incontinence.  No blood in urine. No abdominal pain but always some tenderness.   + LE edema.  No resting dyspnea.  No cough.  No recall of problems with liver. No ETOH.   No syncope.  + diminished vision.   Past Medical History  Diagnosis Date  . Obesity   . Chronic low back pain     Secondary to DJD  . Vitamin D deficiency   . Fatty liver disease, nonalcoholic   . Type 2 diabetes mellitus   . Allergic rhinitis   . Essential hypertension, benign   . Chronic systolic heart failure   . GERD (gastroesophageal reflux disease)   . Coronary atherosclerosis of native coronary artery     Nonobstructive  . Osteopenia   . Depression   . Rosacea   . Mitral regurgitation   . Cardiomyopathy   . Atrial fibrillation   . Myocardial infarction     NSTEMI   . Dysrhythmia     ATRIAL FIBRILATION  .  Shortness of breath   . 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 Anemia     Past Surgical History  Procedure Laterality Date  . Total abdominal hysterectomy w/ bilateral salpingoophorectomy      for dermoid tumor  . Appendectomy    . Cataract extraction    . Cholecystectomy    . Tonsillectomy    . Rotator cuff repair      RIGHT SHOULDER  . Breast biopsy      RIGHT  . Abdominal hysterectomy    . Cardiac catheterization  2010    LAD 50%, CFX 30%, RCA 40%; EF by echo 25-35%    Prior to Admission medications   Medication Sig Start Date End Date Taking? Authorizing Provider  beta carotene w/minerals (OCUVITE) tablet Take 2 tablets by mouth daily.   Yes Historical Provider, MD  Calcium Carbonate-Vitamin D (CALCIUM + D PO) Take 1 tablet by mouth 2 (two) times daily.    Yes Historical Provider, MD  carvedilol (COREG) 25 MG tablet Take 37.5 mg by mouth 2 (two) times daily with a meal.   Yes Historical Provider, MD  digoxin (LANOXIN) 0.125 MG tablet Take 125 mcg by mouth daily.   Yes Historical  Provider, MD  doxycycline (VIBRAMYCIN) 100 MG capsule Take 100-200 mg by mouth 2 (two) times daily as needed (rosacea).    Yes Historical Provider, MD  furosemide (LASIX) 20 MG tablet Take 40 mg by mouth 2 (two) times daily.   Yes Historical Provider, MD  HYDROcodone-acetaminophen (NORCO) 10-325 MG per tablet Take 1 tablet by mouth every 4 (four) hours as needed for moderate pain.    Yes Historical Provider, MD  insulin glargine (LANTUS) 100 UNIT/ML injection Inject 70-90 Units into the skin 2 (two) times daily. 90 units every morning & 70 units every evening.   Yes Historical Provider, MD  iron polysaccharides (NIFEREX) 150 MG capsule Take 150 mg by mouth 2 (two) times daily.   Yes Historical Provider, MD  lisinopril (PRINIVIL,ZESTRIL) 20 MG tablet Take 20 mg by mouth daily.   Yes Historical Provider, MD  metFORMIN (GLUCOPHAGE-XR) 500 MG 24 hr tablet Take 1,000 mg by mouth 2 (two) times daily.   Yes Historical Provider, MD  metroNIDAZOLE (METROGEL) 0.75 % gel Apply 1 application topically 2 (two) times daily as needed (rosacea).   Yes Historical Provider, MD  mometasone (ELOCON) 0.1 % lotion Apply 1 application topically daily as needed (eczema). EAR ECZEMA   Yes Historical Provider, MD  omeprazole (PRILOSEC) 20 MG capsule Take 20 mg by mouth 2 (two) times daily.   Yes Historical Provider, MD  torsemide (DEMADEX) 20 MG tablet Take 20 mg by mouth 2 (two) times daily.   Yes Historical Provider, MD  venlafaxine (EFFEXOR) 75 MG tablet Take 75 mg by mouth 2 (two) times daily.    Yes Historical Provider, MD  warfarin (COUMADIN) 5 MG tablet Take 5-7.5 mg by mouth daily. Take 5mg  on Monday and Friday. All other days take 7.5mg .   Yes Historical Provider, MD    Scheduled Meds: . [START ON 08/25/2013] aspirin EC  81 mg Oral Daily  . carvedilol  25 mg Oral BID WC  . insulin aspart  0-15 Units Subcutaneous TID WC  . insulin aspart  0-5 Units Subcutaneous QHS  . insulin glargine  10 Units Subcutaneous NOW    . [START ON 08/25/2013] insulin glargine  20 Units Subcutaneous Daily  . insulin glargine  50 Units Subcutaneous QHS  .  lisinopril  20 mg Oral Daily  . nitroGLYCERIN  0.5 inch Topical 4 times per day  . pantoprazole  40 mg Oral Daily  . polyethylene glycol  17 g Oral Daily  . rosuvastatin  20 mg Oral q1800  . sodium chloride  3 mL Intravenous Q12H  . venlafaxine  150 mg Oral BID WC   Infusions: . heparin     PRN Meds: sodium chloride, bisacodyl, HYDROcodone-acetaminophen, sodium chloride   Allergies as of 08/21/2013 - Review Complete 07/20/2013  Allergen Reaction Noted  . Codeine  06/06/2009  . Tylox [oxycodone-acetaminophen]  09/18/2012    Family History  Problem Relation Age of Onset  . Lung cancer Father   . Coronary artery disease Father   . Diabetes Father   . Lung cancer Mother   . Asthma Brother   . Other Grandchild     Grandchild with tetralogy of Fallot and Hirschsprng disease    History   Social History  . Marital Status: Legally Separated    Spouse Name: N/A    Number of Children: N/A  . Years of Education: N/A   Occupational History  . Not on file.   Social History Main Topics  . Smoking status: Never Smoker   . Smokeless tobacco: Never Used  . Alcohol Use: No  . Drug Use: No  . Sexual Activity: Not Currently   Other Topics Concern  . Not on file   Social History Narrative  . No narrative on file    PHYSICAL EXAM: Vital signs in last 24 hours: Filed Vitals:   08/24/13 0744  BP: 129/57  Pulse: 68  Temp: 97.7 F (36.5 C)  Resp: 18   Wt Readings from Last 3 Encounters:  08/24/13 116.2 kg (256 lb 2.8 oz)  08/24/13 116.2 kg (256 lb 2.8 oz)  07/20/13 116.121 kg (256 lb)    General: obese, pleasant, comfortable.  Not looking acutely ill or frail Head:  No swelling or asymmetry  Eyes:  No icterus or pallor Ears:  + HOH  Nose:  No discharge Mouth:  Clear, moist, no sores Neck:  No mass, no JVD, no TMG Lungs:  Clear, no dyspnea or  cough Heart: Irreg, Irreg, not tachy or bradycardic.  Abdomen:  Obese, soft, no mass, no HSM.  BS active..   Rectal: no stool or masses, no blood   Musc/Skeltl: no joint contractures Extremities:  + pedal edema  Neurologic:  Oriented x 3.  No tremor.  No limb weakness.  Skin:  No telangectasia or rash Tattoos:  none Nodes:  No cervical adenopathy   Psych:  Pleasant, relaxed, not depressed.  LAB RESULTS:  Recent Labs  08/22/13 0406 08/23/13 0415 08/24/13 0357  WBC 11.5* 10.3 8.1  HGB 8.6* 8.3* 8.3*  HCT 29.7* 29.0* 28.1*  PLT 342 300 273  MCV    71  BMET Lab Results  Component Value Date   NA 134* 08/24/2013   NA 139 08/23/2013   NA 139 08/22/2013   K 4.2 08/24/2013   K 4.0 08/23/2013   K 4.1 08/22/2013   CL 94* 08/24/2013   CL 98 08/23/2013   CL 98 08/22/2013   CO2 29 08/24/2013   CO2 29 08/23/2013   CO2 25 08/22/2013   GLUCOSE 203* 08/24/2013   GLUCOSE 178* 08/23/2013   GLUCOSE 66* 08/22/2013   BUN 24* 08/24/2013   BUN 21 08/23/2013   BUN 22 08/22/2013   CREATININE 1.39* 08/24/2013   CREATININE 1.42* 08/23/2013  CREATININE 1.29* 08/22/2013   CALCIUM 9.1 08/24/2013   CALCIUM 9.0 08/23/2013   CALCIUM 9.1 08/22/2013   LFT  Recent Labs  08/22/13 0406 08/23/13 0415  PROT 7.2 7.2  ALBUMIN 3.1* 3.2*  AST 32 31  ALT 14 13  ALKPHOS 83 84  BILITOT 0.3 0.4   PT/INR Lab Results  Component Value Date   INR 1.91* 08/24/2013   INR 2.15* 08/23/2013   INR 2.28* 08/22/2013    ENDOSCOPIC STUDIES: See HPI.   IMPRESSION:   *  Microcytic anemia.  On po Iron for last 2 to 3 months.   *  Hx of colono polyps, type and date of occurrence not determined.  Surveillance colonoscopy of last 4 years showed no polyps.  This info all per pt and extensive family and they seem reliable.  Has not received call back letter from Dr Leanora Ivanoff, endoscopist.  Chronic constipation, decades old issue, and occasional BPR with hard stools.   *  Hx DU and treatment for HPylori ~ 2000.  On daily  Omeprazole.    *  Non STEMI.  For diagnostic cath today  *  Afib, chronic Coumadin.   *  IDDM  *  Hx fatty liver. Per chart.     PLAN:     *  Per Dr Ardis Hughs.  EGD  tomorrow.    Vena Rua  08/24/2013, 9:52 AM Pager: [phone]    ________________________________________________________________________  Velora Heckler GI MD note:  I personally examined the patient, reviewed the data and agree with the assessment and plan described above.  Microcytic, heme + IDA. She had colonososcopy (several times), last was 3-4 years ago. She does not need repeat now and in fact with recent AMI I would only advise colonoscopy if she has significant overt lower GI bleeding. EGD is probably safer in current context and I recommended we proceed with that tomorrow.     Owens Loffler, MD [facility] Pager [phone]

## 2013-08-24 NOTE — H&P (View-Only) (Signed)
Patient Name: Victoria Adams Date of Encounter: 08/24/2013  Principal Problem:   Acute on chronic systolic heart failure Active Problems:   CARDIOMYOPATHY, DILATED   Atrial fibrillation   Chronic systolic heart failure   CHF exacerbation   NSTEMI (non-ST elevated myocardial infarction)    Patient Profile: 73 yo female with PMH significant for chronic low back pain, chronic anemia, IDDM-Type 2, chronic Afib on warfarin, and supposed nonischemic dilated cardiomyopathy/HF (last EF 40-45% in 2014) who was admitted 05/13 for likely acute on chronic HF decompensation as well as NSTEMI vs mild troponin elevation related to CHF exacerbation.   SUBJECTIVE: Pt without chest pain or SOB. Stools dark since being started on iron, rare BRBPR. Chronic constipation and diarrhea at times, mostly constipation.   OBJECTIVE Filed Vitals:   08/23/13 1617 08/23/13 2027 08/24/13 0003 08/24/13 0420  BP: 162/84 142/42 114/38 154/79  Pulse: 79 72 71 75  Temp: 98.5 F (36.9 C) 98 F (36.7 C) 98 F (36.7 C) 97.7 F (36.5 C)  TempSrc: Oral Oral Oral Oral  Resp: 16 16 17 16   Height:      Weight:   256 lb 2.8 oz (116.2 kg)   SpO2: 94% 94% 92% 97%    Intake/Output Summary (Last 24 hours) at 08/24/13 0622 Last data filed at 08/23/13 2300  Gross per 24 hour  Intake   1140 ml  Output    401 ml  Net    739 ml   Filed Weights   08/22/13 0325 08/23/13 0001 08/24/13 0003  Weight: 254 lb 10.1 oz (115.5 kg) 254 lb 10.1 oz (115.5 kg) 256 lb 2.8 oz (116.2 kg)    PHYSICAL EXAM General: Well developed, well nourished, female in no acute distress. Head: Normocephalic, atraumatic.  Neck: Supple without bruits, JVD slightly elevated. Lungs:  Resp regular and unlabored, CTA. Heart: Irregular, S1, S2, no S3, S4, or murmur; no rub. Abdomen: Soft, non-tender, non-distended, BS + x 4.  Extremities: No clubbing, cyanosis, no edema.  Neuro: Alert and oriented X 3. Moves all extremities spontaneously. Psych:  Normal affect.  LABS: CBC: Recent Labs  08/23/13 0415 08/24/13 0357  WBC 10.3 8.1  NEUTROABS 6.6 4.6  HGB 8.3* 8.3*  HCT 29.0* 28.1*  MCV 71.3* 70.6*  PLT 300 273   INR: Recent Labs  08/24/13 0357  INR 9.98*   Basic Metabolic Panel: Recent Labs  08/23/13 0415 08/24/13 0357  NA 139 134*  K 4.0 4.2  CL 98 94*  CO2 29 29  GLUCOSE 178* 203*  BUN 21 24*  CREATININE 1.42* 1.39*  CALCIUM 9.0 9.1  MG 2.1 2.2   Liver Function Tests: Recent Labs  08/22/13 0406 08/23/13 0415  AST 32 31  ALT 14 13  ALKPHOS 83 84  BILITOT 0.3 0.4  PROT 7.2 7.2  ALBUMIN 3.1* 3.2*   Cardiac Enzymes: Recent Labs  08/22/13 0406 08/22/13 1038 08/22/13 1600  TROPONINI 1.45* 4.17* 6.71*   BNP: Pro B Natriuretic peptide (BNP)  Date/Time Value Ref Range Status  08/22/2013  4:06 AM 1368.0* 0 - 125 pg/mL Final  06/15/2008  4:34 AM 326.0* 0.0 - 100.0 pg/mL Final   Anemia Panel: Recent Labs  08/22/13 0406  FERRITIN 6*  TIBC 366  IRON 13*   Lab Results  Component Value Date   OCCULTBLD POSITIVE* 08/23/2013    TELE: atrial fib, rate generally controlled.    ECHO: 08/23/2013 - Left ventricle: The cavity size was mildly dilated. Wall thickness  was increased in a pattern of moderate LVH. Systolic function was severely reduced. The estimated ejection fraction was in the range of 20% to 25%. Diffuse hypokinesis. Severe hypokinesis of the entire myocardium. - Aortic valve: Mildly calcified annulus. - Left atrium: The atrium was severely dilated. - Right atrium: The atrium was mildly dilated.   Current Medications:  . aspirin EC  81 mg Oral Daily  . carvedilol  25 mg Oral BID WC  . insulin aspart  0-15 Units Subcutaneous TID WC  . insulin aspart  0-5 Units Subcutaneous QHS  . insulin glargine  20 Units Subcutaneous Daily  . insulin glargine  40 Units Subcutaneous QHS  . lisinopril  20 mg Oral Daily  . nitroGLYCERIN  0.5 inch Topical 4 times per day  . pantoprazole  40 mg Oral  Daily  . polyethylene glycol  17 g Oral Daily  . rosuvastatin  20 mg Oral q1800  . sodium chloride  3 mL Intravenous Q12H  . venlafaxine  150 mg Oral BID WC   . heparin      ASSESSMENT AND PLAN:  1. Acute on chronic heart failure decompensation: BNP 1368. Net I/O -2511. Patient appears euvolemic on exam, her weight is up slightly. She is breathing comfortably on RA.  -Hold Lasix as her volume status has improved  -Continue daily weights and strict I/O  -2D Echo results above, significant LVD  -Continue BB, ACEI. Digoxin d/c on admission, resume per MD.  - Resume by mouth Lasix when appropriate, possibly in the a.m.   2. NSTEMI: INR subtherapeutic but FOB+, MD advise on cardiac cath. Could do cors only to assess CAD. Patient is cardiac stable on medical therapy.  -Heparin ordered, has not been started, continue to monitor INR  -Continue nitro patch  -Plan for diagnostic LHC +/- PCI once pt medically stable  -Continue ASA, BB, ACE, statin   3. Iron deficiency anemia: Patient states that her PCP diagnosed her with iron deficiency anemia about 2 months ago and she has been on iron supplements bid since then.  - continue supplement - suspect GI blood loss  4. Chronic Afib: Ventricular rate is controlled in the 70-80s. Continue BB for rate control.  -Hold warfarin to pursue LHC   5. Elevated D-dimer: Patient has been therapeutic on warfarin so further evaluation not needed.   6. Sacral/back pain: Pelvic and sacral Xray both negative for fracture. Patient still endorses back pain.  -Continue Norco as needed for pain   7. Anticoagulation: Warfarin on hold. Heparin ordered for when INR <2.0, RN has not started it yet, MD to review data and advise.  8.Guaiac positive stool: MD review and advise, may need GI to see. Has problems with constipation, probably worse since on Iron -Miralax 17mg  packet daily  -Suppository as needed    Principal Problem:   Acute on chronic systolic heart  failure Active Problems:   CARDIOMYOPATHY, DILATED   Atrial fibrillation   Chronic systolic heart failure   CHF exacerbation   NSTEMI (non-ST elevated myocardial infarction)   SignedLonn Georgia , PA-C 6:22 AM 08/24/2013

## 2013-08-24 NOTE — H&P (View-Only) (Signed)
Pt. Seen and examined. Agree with the NP/PA-C note as written.  NSTEMI troponin to 6. Now chest pain free. She has a microcytic anemia - guaiac positive. D/W GI .. They would not likely do colonoscopy with her recent NSTEMI. She has had constipation and diarrhea over several months, this concerning for possibly a colonic mass. Would not like to perform PCI if there is possible bleeding risk. Would recommend a diagnostic LHC from a radial approach - d/w Dr. Harding - hopefully we can define her anatomy and that may help define her risk for GI work-up,.  Will consult GI today.  Gabriel Conry C. Nera Haworth, MD, FACC Attending Cardiologist [facility]   

## 2013-08-25 ENCOUNTER — Encounter (HOSPITAL_COMMUNITY)
Admission: EM | Disposition: A | Discharge status: Home Health | Payer: Self-pay | Source: Other Acute Inpatient Hospital | Attending: Cardiology

## 2013-08-25 ENCOUNTER — Encounter (HOSPITAL_COMMUNITY): Payer: Self-pay | Admitting: Physician Assistant

## 2013-08-25 DIAGNOSIS — D509 Iron deficiency anemia, unspecified: Secondary | ICD-10-CM | POA: Diagnosis not present

## 2013-08-25 DIAGNOSIS — I4891 Unspecified atrial fibrillation: Secondary | ICD-10-CM | POA: Diagnosis not present

## 2013-08-25 DIAGNOSIS — D649 Anemia, unspecified: Secondary | ICD-10-CM

## 2013-08-25 DIAGNOSIS — I2510 Atherosclerotic heart disease of native coronary artery without angina pectoris: Secondary | ICD-10-CM | POA: Diagnosis not present

## 2013-08-25 DIAGNOSIS — Z7901 Long term (current) use of anticoagulants: Secondary | ICD-10-CM

## 2013-08-25 DIAGNOSIS — I428 Other cardiomyopathies: Secondary | ICD-10-CM

## 2013-08-25 DIAGNOSIS — I5023 Acute on chronic systolic (congestive) heart failure: Secondary | ICD-10-CM | POA: Diagnosis not present

## 2013-08-25 DIAGNOSIS — R195 Other fecal abnormalities: Secondary | ICD-10-CM | POA: Diagnosis not present

## 2013-08-25 HISTORY — PX: ESOPHAGOGASTRODUODENOSCOPY: SHX5428

## 2013-08-25 LAB — BASIC METABOLIC PANEL
BUN: 26 mg/dL — AB (ref 6–23)
CO2: 24 mEq/L (ref 19–32)
Calcium: 8.4 mg/dL (ref 8.4–10.5)
Chloride: 94 mEq/L — ABNORMAL LOW (ref 96–112)
Creatinine, Ser: 1.33 mg/dL — ABNORMAL HIGH (ref 0.50–1.10)
GFR, EST AFRICAN AMERICAN: 45 mL/min — AB (ref >90)
GFR, EST NON AFRICAN AMERICAN: 39 mL/min — AB (ref >90)
Glucose, Bld: 231 mg/dL — ABNORMAL HIGH (ref 70–99)
POTASSIUM: 4.5 meq/L (ref 3.7–5.3)
Sodium: 132 mEq/L — ABNORMAL LOW (ref 137–147)

## 2013-08-25 LAB — CBC WITH DIFFERENTIAL/PLATELET
Basophils Absolute: 0.1 10*3/uL (ref 0.0–0.1)
Basophils Relative: 1 % (ref 0–1)
EOS PCT: 4 % (ref 0–5)
Eosinophils Absolute: 0.3 10*3/uL (ref 0.0–0.7)
HEMATOCRIT: 28.4 % — AB (ref 36.0–46.0)
HEMOGLOBIN: 8.4 g/dL — AB (ref 12.0–15.0)
LYMPHS PCT: 23 % (ref 12–46)
Lymphs Abs: 1.8 10*3/uL (ref 0.7–4.0)
MCH: 20.7 pg — ABNORMAL LOW (ref 26.0–34.0)
MCHC: 29.6 g/dL — ABNORMAL LOW (ref 30.0–36.0)
MCV: 70.1 fL — AB (ref 78.0–100.0)
MONOS PCT: 12 % (ref 3–12)
Monocytes Absolute: 1 10*3/uL (ref 0.1–1.0)
NEUTROS ABS: 4.8 10*3/uL (ref 1.7–7.7)
Neutrophils Relative %: 60 % (ref 43–77)
Platelets: 321 10*3/uL (ref 150–400)
RBC: 4.05 MIL/uL (ref 3.87–5.11)
RDW: 18.8 % — AB (ref 11.5–15.5)
WBC: 8 10*3/uL (ref 4.0–10.5)

## 2013-08-25 LAB — GLUCOSE, CAPILLARY
Glucose-Capillary: 240 mg/dL — ABNORMAL HIGH (ref 70–99)
Glucose-Capillary: 179 mg/dL — ABNORMAL HIGH (ref 70–99)
Glucose-Capillary: 244 mg/dL — ABNORMAL HIGH (ref 70–99)
Glucose-Capillary: 222 mg/dL — ABNORMAL HIGH (ref 70–99)

## 2013-08-25 LAB — PROTIME-INR
INR: 1.39 (ref 0.00–1.49)
PROTHROMBIN TIME: 16.7 s — AB (ref 11.6–15.2)

## 2013-08-25 LAB — MAGNESIUM: MAGNESIUM: 2.3 mg/dL (ref 1.5–2.5)

## 2013-08-25 SURGERY — EGD (ESOPHAGOGASTRODUODENOSCOPY)
Anesthesia: Moderate Sedation

## 2013-08-25 MED ORDER — MIDAZOLAM HCL 5 MG/ML IJ SOLN
INTRAMUSCULAR | Status: AC
  Filled 2013-08-25: qty 2

## 2013-08-25 MED ORDER — FERROUS SULFATE 325 (65 FE) MG PO TABS
325.0000 mg | ORAL_TABLET | Freq: Every day | ORAL | Status: DC
Start: 2013-08-25 — End: 2013-09-05
  Administered 2013-08-25 – 2013-09-05 (×12): 325 mg via ORAL
  Filled 2013-08-25 (×15): qty 1

## 2013-08-25 MED ORDER — WARFARIN SODIUM 10 MG PO TABS
10.0000 mg | ORAL_TABLET | Freq: Once | ORAL | Status: AC
  Administered 2013-08-25: 10 mg via ORAL
  Filled 2013-08-25: qty 1

## 2013-08-25 MED ORDER — MIDAZOLAM HCL 5 MG/ML IJ SOLN
INTRAMUSCULAR | Status: AC
  Filled 2013-08-25: qty 1

## 2013-08-25 MED ORDER — POLYETHYLENE GLYCOL 3350 17 G PO PACK: 17.0000 g | PACK | Freq: Every day | ORAL | Status: DC

## 2013-08-25 MED ORDER — ROSUVASTATIN CALCIUM 20 MG PO TABS: 20.0000 mg | ORAL_TABLET | Freq: Every day | ORAL | Status: DC

## 2013-08-25 MED ORDER — BUTAMBEN-TETRACAINE-BENZOCAINE 2-2-14 % EX AERO
INHALATION_SPRAY | CUTANEOUS | Status: DC | PRN
  Administered 2013-08-25: 2 via TOPICAL

## 2013-08-25 MED ORDER — LIVING BETTER WITH HEART FAILURE BOOK
Freq: Once | Status: AC
  Administered 2013-08-25: 05:00:00
  Filled 2013-08-25: qty 1

## 2013-08-25 MED ORDER — FENTANYL CITRATE 0.05 MG/ML IJ SOLN
INTRAMUSCULAR | Status: AC
  Filled 2013-08-25: qty 2

## 2013-08-25 MED ORDER — MIDAZOLAM HCL 10 MG/2ML IJ SOLN
INTRAMUSCULAR | Status: DC | PRN
  Administered 2013-08-25: 2 mg via INTRAVENOUS
  Administered 2013-08-25: 1 mg via INTRAVENOUS

## 2013-08-25 MED ORDER — WARFARIN - PHARMACIST DOSING INPATIENT
Freq: Every day | Status: DC
  Administered 2013-08-29: 18:00:00
  Administered 2013-08-30: 1

## 2013-08-25 MED ORDER — SODIUM CHLORIDE 0.9 % IV SOLN
INTRAVENOUS | Status: DC
  Administered 2013-08-27 (×2): via INTRAVENOUS

## 2013-08-25 MED ORDER — ASPIRIN 81 MG PO TBEC: 81.0000 mg | DELAYED_RELEASE_TABLET | Freq: Every day | ORAL | Status: DC

## 2013-08-25 MED ORDER — LIVING WELL WITH DIABETES BOOK
Freq: Once | Status: AC
  Administered 2013-08-25: 05:00:00
  Filled 2013-08-25: qty 1

## 2013-08-25 MED ORDER — FENTANYL CITRATE 0.05 MG/ML IJ SOLN
INTRAMUSCULAR | Status: DC | PRN
  Administered 2013-08-25 (×2): 25 ug via INTRAVENOUS

## 2013-08-25 MED ORDER — CARVEDILOL 25 MG PO TABS
25.0000 mg | ORAL_TABLET | Freq: Two times a day (BID) | ORAL | Status: DC
Start: 2013-08-25 — End: 2013-09-05

## 2013-08-25 NOTE — Progress Notes (Signed)
ANTICOAGULATION CONSULT NOTE - Initial Consult  Pharmacy Consult for Warfarin Indication: Hx Afib  Allergies  Allergen Reactions  . Codeine Nausea And Vomiting  . Tylox [Oxycodone-Acetaminophen]     Vision closing in & heart palpitations.     Patient Measurements: Height: 5\' 7"  (170.2 cm) Weight: 258 lb 9.6 oz (117.3 kg) (scale C) IBW/kg (Calculated) : 61.6  Vital Signs: Temp: 98.2 F (36.8 C) (05/16 1410) Temp src: Oral (05/16 1410) BP: 123/56 mmHg (05/16 1410) Pulse Rate: 69 (05/16 1410)  Labs:  Recent Labs  08/22/13 1600  08/23/13 0415 08/24/13 0357 08/25/13 0450  HGB  --   < > 8.3* 8.3* 8.4*  HCT  --   --  29.0* 28.1* 28.4*  PLT  --   --  300 273 321  LABPROT  --   --  23.3* 21.3* 16.7*  INR  --   --  2.15* 1.91* 1.39  CREATININE  --   --  1.42* 1.39* 1.33*  TROPONINI 6.71*  --   --   --   --   < > = values in this interval not displayed.  Estimated Creatinine Clearance: 51.4 ml/min (by C-G formula based on Cr of 1.33).   Medical History: Past Medical History  Diagnosis Date  . Obesity   . Chronic low back pain     Secondary to DJD  . Vitamin D deficiency   . Fatty liver disease, nonalcoholic   . Type 2 diabetes mellitus   . Allergic rhinitis   . Essential hypertension, benign   . Chronic systolic heart failure   . GERD (gastroesophageal reflux disease)   . Coronary atherosclerosis of native coronary artery     LHC (5/15):  EF 40-45% global HK; long LAD 40-60%, ostial 1st major septal perf 80-90%, ostial CFX ?%, mid AVCFX extensive Ca2+, dist PDA 50%  . Osteopenia   . Depression   . Rosacea   . Mitral regurgitation   . NICM (nonischemic cardiomyopathy)     a. echo (5/15):  mod LVH, EF 20-25%, diff HK, severe LAE, mild RAE  . Atrial fibrillation   . NSTEMI (non-ST elevated myocardial infarction)     08/2013  . Atrial fibrillation   . 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 Iron deficiency anemia   . CKD (chronic kidney disease)      Assessment: 89 YOF who was transferred from [facility] to [facility] on 5/13 for cardiology evaluation in the setting if elevated troponins and CP. Warfarin PTA for hx Afib was held for the INR to trend down to go to cath. The patient underwent a cardiac cath on 5/15 which showed moderate diffuse mid LAD disease to be managed medically.   While being worked up for cardiac issues, the patient was also noted to have anemia and FOB+. EGD done today (5/16) which was normal and per GI, is okay to resume anticoagulation. Pharmacy has been consulted to resume warfarin for hx Afib.   INR is SUBtherapeutic after warfarin since admission. The last dose taken was on 5/12. PTA the patient was on 7.5 mg daily EXCEPT for 5 mg on Mon/Fri. Will monitor for bleeding closely. Will give a higher dose this evening since the patient has not had warfarin in ~3 days.   Goal of Therapy:  INR 2-3   Plan:  1. Warfarin 10 mg x 1 dose at 1800 today 2. Daily PT/INR 3. Will continue to monitor for any signs/symptoms of bleeding and  will follow up with PT/INR in the a.m.   Alycia Rossetti, PharmD, BCPS Clinical Pharmacist Pager: [phone] 08/25/2013 3:55 PM

## 2013-08-25 NOTE — Progress Notes (Signed)
TR BAND REMOVAL  LOCATION:    right radial  DEFLATED PER PROTOCOL:    yes  TIME BAND OFF / DRESSING APPLIED:    08/24/12 @1930    SITE UPON ARRIVAL:    Level 0  SITE AFTER BAND REMOVAL:    Level 0  REVERSE ALLEN'S TEST:     positive  CIRCULATION SENSATION AND MOVEMENT:    Within Normal Limits   yes  COMMENTS:

## 2013-08-25 NOTE — Discharge Summary (Signed)
Personally seen and examined. Agree with above. EGD normal. Resuming coumadin (AFIB) Monitor CBC as outpatient.

## 2013-08-25 NOTE — Discharge Summary (Signed)
Discharge Summary   Patient ID: Victoria Adams, MRN: [id number], DOB/AGE: December 26, 1940 73 y.o.  Admit date: 08/22/2013 Discharge date: 08/25/2013   Primary Care Physician:  Gar Ponto   Primary Cardiologist:  Dr. Rozann Lesches [facility])   Reason for Admission:  CHF, NSTEMI   Primary Discharge Diagnoses:  Principal Problem:   Acute on chronic systolic heart failure Active Problems:   Essential hypertension, benign   CARDIOMYOPATHY, DILATED   Atrial fibrillation   NSTEMI (non-ST elevated myocardial infarction)   IDA (iron deficiency anemia)   Heme positive stool     Wt Readings from Last 3 Encounters:  08/25/13 258 lb 9.6 oz (117.3 kg)  08/25/13 258 lb 9.6 oz (117.3 kg)  08/25/13 258 lb 9.6 oz (117.3 kg)      Past Medical History  Diagnosis Date  . Obesity   . Chronic low back pain     Secondary to DJD  . Vitamin D deficiency   . Fatty liver disease, nonalcoholic   . Type 2 diabetes mellitus   . Allergic rhinitis   . Essential hypertension, benign   . Chronic systolic heart failure   . GERD (gastroesophageal reflux disease)   . Coronary atherosclerosis of native coronary artery     LHC (5/15):  EF 40-45% global HK; long LAD 40-60%, ostial 1st major septal perf 80-90%, ostial CFX ?%, mid AVCFX extensive Ca2+, dist PDA 50%  . Osteopenia   . Depression   . Rosacea   . Mitral regurgitation   . NICM (nonischemic cardiomyopathy)     a. echo (5/15):  mod LVH, EF 20-25%, diff HK, severe LAE, mild RAE  . Atrial fibrillation   . NSTEMI (non-ST elevated myocardial infarction)     08/2013  . Atrial fibrillation   . 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 Iron deficiency anemia   . CKD (chronic kidney disease)       Allergies:    Allergies  Allergen Reactions  . Codeine Nausea And Vomiting  . Tylox [Oxycodone-Acetaminophen]     Vision closing in & heart palpitations.       Procedures Performed This Admission:    Cardiac Catheterization 08/24/13: Left  Ventriculography:  EF: 40 and 45% with global hypokinesis Coronary Anatomy: There is at least moderate calcification in the left main as well as proximal to mid LAD and Circumflex.  Dominance: Left Left Main: Large-caliber, short vessel that bifurcates into the LAD and Circumflex. LAD: Large-caliber vessel gives rise to a very proximal D1.  Following this there is a long ring 40-60% stenosis (60% being the most significant distally at the take off of a septal perforator - SP2).  Just prior to the more notable tapering, there is an ostial 80-90% stenosis in the first major septal perforator - SP 1.  In the mid vessel there is a smaller diagonal branch is relatively insignificant. At that point the vessel actually covers relatively normal. In courses down around the apex giving rise several smaller branches. There is actually very distal diagonal branch at the apex. There is no other significant lesions beyond the mid vessel.  It is quite possible that the aggregate stenoses in the mid LAD could potentially be within significant, would require a greater than 30 mm segment of stent. D1: Moderate caliber vessel that has a very proximal branch and then bifurcates distally. Tortuous but minimal luminal irregularities. Left Circumflex: Large caliber, dominant vessel with an ostial 9010% lesion. There is extensive calcification in  the mid vessel/AV groove region. There is at least 2 tiny marginal branches prior to the vessel essentially trifurcating into OM 1, OM 2 and the follow on AV groove circumflex. Angiographically normal other than calcified.  OM1: Moderate-large caliber, tortuous vessel; intravesically normal.  OM 2: Begins as a moderate large-caliber vessel and trifurcates itself into 3 small to moderate caliber branches the would be considered more consistent with left posterior lateral branches  The AV Groove Circumflex continues as a moderate to large caliber vessel gives rise to a small-moderate  caliber left posterior descending artery. Just at the distal band before this vessel becomes the PDA, there is a tubular eccentric roughly 50% stenosis. RCA: Very small nondominant with 2 marginal branches  After reviewing the initial angiography, no obvious culprit lesion was identified. The only potential culprit for her MI in the setting of anemia could be the septal perforator lesion versus the long moderate stenosis in the mid LAD.   ENDOSCOPY PROCEDURE REPORT  PATIENT: Victoria Adams, Victoria Adams MR#: [id number]  BIRTHDATE: November 25, 1940 , 73 yrs. old GENDER: Female  ENDOSCOPIST: Milus Banister, MD  PROCEDURE DATE: 08/25/2013  PROCEDURE: EGD, diagnostic  ASA CLASS: Class IV  INDICATIONS: heme positive anemia, microcytic; colonsocopy 3-4  years ago normal per patient and family.  MEDICATIONS: Fentanyl 50 mcg IV and Versed 3 mg IV  TOPICAL ANESTHETIC: none  DESCRIPTION OF PROCEDURE: After the risks benefits and alternatives  of the procedure were thoroughly explained, informed consent was  obtained. The Pentax Gastroscope [id number] endoscope was introduced  through the mouth and advanced to the second portion of the  duodenum. Without limitations. The instrument was slowly withdrawn  as the mucosa was fully examined.  The upper, middle and distal third of the esophagus were carefully  inspected and no abnormalities were noted. The z-line was well  seen at the GEJ. The endoscope was pushed into the fundus which  was normal including a retroflexed view. The antrum, gastric body,  first and second part of the duodenum were unremarkable.  Retroflexed views revealed no abnormalities. The scope was then  withdrawn from the patient and the procedure completed.  COMPLICATIONS: There were no complications.  ENDOSCOPIC IMPRESSION:  Normal EGD  RECOMMENDATIONS:  She can resume any blood thinners that are felt to be needed to  treat her heart disease. She should stay on iron once daily. Try  to keep INR  level from getting too elevated. If she has any overt  GI bleeding, please call. She should take miralax one dose daily  for her chronic constipation.  eSigned: Milus Banister, MD 08/25/2013 10:58 AM    Hospital Course:  Victoria Adams is a 73 y.o. obese female with a hx of chronic low back pain, low physical functional status x 1 year but still performs ADL's, chronic anemia, IDDM-Type 2, HTN, chronic AF on warfarin and thought to be nonischemic dilated CM/HFrEF=40-45% per 2014 Echo.  She was transferred from [facility] on the date of admission with severe dyspnea and chest pain with radiation to the L arm.  Initial troponin was elevated at 0.1 and DDimer was marginally elevated at 0.5 (INR was therapeutic).  She had a recent hx of increasing edema, DOE, orthopnea, PND x 2 weeks as well as recent loose non-bloody stools and nausea but no vomiting.  CXR was neg for pulmonary edema at [facility].  ECG demonstrated AFib with LBBB and CVR.  She was admitted with likely a/c combined systolic and diastolic CHF  and possible NSTEMI.  She was noted to be anemic with Hgb of 9.1.    She was diuresed with IV Lasix.  She symptomatically improved and was neg 2.9 L at discharge.  Subsequent troponins returned elevated (peak 6) ruling her in for NSTEMI.  Echo demonstrated further reduced EF at 20-25% with diffuse HK.  Coumadin was held for cardiac cath.  Stool guaiac returned positive.  GI was consulted.  She was seen by Dr. Ardis Hughs.  Patient has a hx of colon polyps.  Last colo 4 years ago was neg for polyps.  Repeat colo was not felt to be necessary (or advised given recent NSTEMI) unless overt LGI bleeding.  She underwent diagnostic LHC on 08/24/13 that demonstrated mod mid LAD disease of 40-60%, major septal perforator branch with ostial 90%.  These were the only potential culprits for a NSTEMI in the setting of anemia and decompensated CHF.  It was felt that medical therapy should be pursued at this point.     Patient was seen by Dr. Candee Furbish this AM who felt she was stable for DC pending results of the EGD.  Volume is currently stable.  She subsequently underwent EGD which was normal.  Warfarin will be restarted. Per GI, she will stay on miralax daily and iron.  She will follow up with Dr. Rozann Lesches.     Discharge Vitals:   Blood pressure 160/71, pulse 74, temperature 97.7 F (36.5 C), temperature source Oral, resp. rate 20, height 5\' 7"  (1.702 m), weight 258 lb 9.6 oz (117.3 kg), SpO2 93.00%.   Labs:   Recent Labs  08/23/13 0415 08/24/13 0357 08/25/13 0450  WBC 10.3 8.1 8.0  HGB 8.3* 8.3* 8.4*  HCT 29.0* 28.1* 28.4*  MCV 71.3* 70.6* 70.1*  PLT 300 273 321     Recent Labs  08/23/13 0415 08/24/13 0357 08/25/13 0450  NA 139 134* 132*  K 4.0 4.2 4.5  CL 98 94* 94*  CO2 29 29 24   BUN 21 24* 26*  CREATININE 1.42* 1.39* 1.33*  CALCIUM 9.0 9.1 8.4  PROT 7.2  --   --   BILITOT 0.4  --   --   ALKPHOS 84  --   --   ALT 13  --   --   AST 31  --   --      Recent Labs  08/22/13 1600  TROPONINI 6.71*      Recent Labs  08/23/13 0415 08/24/13 0357 08/25/13 0450  INR 2.15* 1.91* 1.39    Lab Results  Component Value Date   IRON 13* 08/22/2013   TIBC 366 08/22/2013   FERRITIN 6* 08/22/2013     Lab Results  Component Value Date   OCCULTBLD POSITIVE* 08/23/2013     Diagnostic Procedures and Studies:  Dg Sacrum/coccyx  08/22/2013     IMPRESSION: Negative.   Electronically Signed   By: Elon Alas   On: 08/22/2013 04:46   Dg Hip Bilateral W/pelvis   08/22/2013   IMPRESSION: No acute fracture deformity or dislocation.   Electronically Signed   By: Elon Alas   On: 08/22/2013 04:47     2D Echocardiogram 08/23/13 - Study Conclusions  - Left ventricle: The cavity size was mildly dilated. Wall thickness was increased in a pattern of moderate LVH. Systolic function was severely reduced. The estimated ejection fraction was in the range of 20% to  25%. Diffuse hypokinesis. Severe hypokinesis of the entire myocardium. - Aortic valve: Mildly calcified annulus. -  Left atrium: The atrium was severely dilated. - Right atrium: The atrium was mildly dilated.    Disposition:   Pt is being discharged home today in good condition.  Follow-up Plans & Appointments Dr. Domenic Polite 1-2 weeks     Follow-up Information   Follow up with Rozann Lesches, MD. (The office will call you with the follow up appt date and time.)    Specialty:  Cardiology   Contact information:   [address] [place] [address] [phone]       Discharge Medications    Medication List    STOP taking these medications       digoxin 0.125 MG tablet  Commonly known as:  LANOXIN     torsemide 20 MG tablet  Commonly known as:  DEMADEX      TAKE these medications       aspirin 81 MG EC tablet  Take 1 tablet (81 mg total) by mouth daily.     beta carotene w/minerals tablet  Take 2 tablets by mouth daily.     CALCIUM + D PO  Take 1 tablet by mouth 2 (two) times daily.     carvedilol 25 MG tablet  Commonly known as:  COREG  Take 1 tablet (25 mg total) by mouth 2 (two) times daily with a meal.     doxycycline 100 MG capsule  Commonly known as:  VIBRAMYCIN  Take 100-200 mg by mouth 2 (two) times daily as needed (rosacea).     furosemide 20 MG tablet  Commonly known as:  LASIX  Take 40 mg by mouth 2 (two) times daily.     HYDROcodone-acetaminophen 10-325 MG per tablet  Commonly known as:  NORCO  Take 1 tablet by mouth every 4 (four) hours as needed for moderate pain.     insulin glargine 100 UNIT/ML injection  Commonly known as:  LANTUS  Inject 70-90 Units into the skin 2 (two) times daily. 90 units every morning & 70 units every evening.     iron polysaccharides 150 MG capsule  Commonly known as:  NIFEREX  Take 150 mg by mouth 2 (two) times daily.     lisinopril 20 MG tablet  Commonly known as:  PRINIVIL,ZESTRIL  Take 20 mg by  mouth daily.     metFORMIN 500 MG 24 hr tablet  Commonly known as:  GLUCOPHAGE-XR  Take 1,000 mg by mouth 2 (two) times daily.     metroNIDAZOLE 0.75 % gel  Commonly known as:  METROGEL  Apply 1 application topically 2 (two) times daily as needed (rosacea).     mometasone 0.1 % lotion  Commonly known as:  ELOCON  Apply 1 application topically daily as needed (eczema). EAR ECZEMA     omeprazole 20 MG capsule  Commonly known as:  PRILOSEC  Take 20 mg by mouth 2 (two) times daily.     polyethylene glycol packet  Commonly known as:  MIRALAX / GLYCOLAX  Take 17 g by mouth daily.     rosuvastatin 20 MG tablet  Commonly known as:  CRESTOR  Take 1 tablet (20 mg total) by mouth daily at 6 PM.     venlafaxine 75 MG tablet  Commonly known as:  EFFEXOR  Take 75 mg by mouth 2 (two) times daily.     warfarin 5 MG tablet  Commonly known as:  COUMADIN  Take 5-7.5 mg by mouth daily. Take 5mg  on Monday and Friday. All other days take 7.5mg .  Outstanding Labs/Studies  1. None  Duration of Discharge Encounter: Greater than 30 minutes including physician and PA time.  Signed, Tarri Fuller, PA-C

## 2013-08-25 NOTE — Interval H&P Note (Signed)
History and Physical Interval Note:  08/25/2013 10:32 AM  Victoria Adams  has presented today for surgery, with the diagnosis of heme positive, IDA  The various methods of treatment have been discussed with the patient and family. After consideration of risks, benefits and other options for treatment, the patient has consented to  Procedure(s): ESOPHAGOGASTRODUODENOSCOPY (EGD) (N/A) as a surgical intervention .  The patient's history has been reviewed, patient examined, no change in status, stable for surgery.  I have reviewed the patient's chart and labs.  Questions were answered to the patient's satisfaction.     Milus Banister

## 2013-08-25 NOTE — H&P (View-Only) (Signed)
[facility] Consult: 9:52 AM 08/24/2013  LOS: 2 days    Referring Provider: Dr Debara Pickett.  Primary Care Physician:  Gar Ponto, MD Primary Gastroenterologist:  Althia Forts.     Reason for Consultation:  Microcytic anemia.    HPI: Victoria Adams is a 73 y.o. female.  PMHx of type 2 IDDM, chronic low back pain, low physical functional status, chronic anemia, IDDM-Type 2, HTN, chronic AF on warfarin, dilated CMO with EF=40-45% per 2014 Echo (thought to be nonischemic).  NASH. On first colonoscopy had polyps, her second colonoscopy within last 4 years showed no polyps.  No previous EGD but diagnosed with duodenal ulcer and H Pylori about 2000.  On Omeprazole since. H Pylori was treated.   Admitted on transfer from [facility] 5/13 with dyspnea, increased edema, CP radiating to left arm.   Troponins peaked at 6, ? nonSTEMI vs sequela of acute heart failure.  Waiting on INR to drop before cath.  Likely will be done on 5/18.  Stool is FOBT +.  Hgb is 8.3, was 12.3 in 2010.  MCV low at 71. INR 2.2, but down to 1.9 without vitamin K or FFP. Started on po Iron by PMD 3 months ago.  Takes Omeprazole faithfully, still has occasional nausea and non-bloody emesis.  appetite good, no weight loss.  No dysphagia.  No NSAIDS except infrequent ASA;   Has life long problems with constipation.  May go a week or more between BMs.  Requires laxatives to have BMs 2 to 3 times per week.  Miralax used to help, but lately ineffective.  Now using MOM.  Mag citrate effective, but makes her feel awful. Had small BM after rectal suppository yesterday which was FOBT+.  Occasional scant BPR always after hard BM.  Last month saw more blood than usual on one occasion.  Never blood in emesis.    ROS: [place] a few days PTA but not syncopal.  This led to worsening back pain.  CBG of 40 a few days PTA, self treated with juice.  Chronic urinary incontinence.  No blood in urine. No abdominal pain but always some tenderness.   + LE edema.  No resting dyspnea.  No cough.  No recall of problems with liver. No ETOH.   No syncope.  + diminished vision.   Past Medical History  Diagnosis Date  . Obesity   . Chronic low back pain     Secondary to DJD  . Vitamin D deficiency   . Fatty liver disease, nonalcoholic   . Type 2 diabetes mellitus   . Allergic rhinitis   . Essential hypertension, benign   . Chronic systolic heart failure   . GERD (gastroesophageal reflux disease)   . Coronary atherosclerosis of native coronary artery     Nonobstructive  . Osteopenia   . Depression   . Rosacea   . Mitral regurgitation   . Cardiomyopathy   . Atrial fibrillation   . Myocardial infarction     NSTEMI   . Dysrhythmia     ATRIAL FIBRILATION  .  Shortness of breath   . 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 Anemia     Past Surgical History  Procedure Laterality Date  . Total abdominal hysterectomy w/ bilateral salpingoophorectomy      for dermoid tumor  . Appendectomy    . Cataract extraction    . Cholecystectomy    . Tonsillectomy    . Rotator cuff repair      RIGHT SHOULDER  . Breast biopsy      RIGHT  . Abdominal hysterectomy    . Cardiac catheterization  2010    LAD 50%, CFX 30%, RCA 40%; EF by echo 25-35%    Prior to Admission medications   Medication Sig Start Date End Date Taking? Authorizing Provider  beta carotene w/minerals (OCUVITE) tablet Take 2 tablets by mouth daily.   Yes Historical Provider, MD  Calcium Carbonate-Vitamin D (CALCIUM + D PO) Take 1 tablet by mouth 2 (two) times daily.    Yes Historical Provider, MD  carvedilol (COREG) 25 MG tablet Take 37.5 mg by mouth 2 (two) times daily with a meal.   Yes Historical Provider, MD  digoxin (LANOXIN) 0.125 MG tablet Take 125 mcg by mouth daily.   Yes Historical  Provider, MD  doxycycline (VIBRAMYCIN) 100 MG capsule Take 100-200 mg by mouth 2 (two) times daily as needed (rosacea).    Yes Historical Provider, MD  furosemide (LASIX) 20 MG tablet Take 40 mg by mouth 2 (two) times daily.   Yes Historical Provider, MD  HYDROcodone-acetaminophen (NORCO) 10-325 MG per tablet Take 1 tablet by mouth every 4 (four) hours as needed for moderate pain.    Yes Historical Provider, MD  insulin glargine (LANTUS) 100 UNIT/ML injection Inject 70-90 Units into the skin 2 (two) times daily. 90 units every morning & 70 units every evening.   Yes Historical Provider, MD  iron polysaccharides (NIFEREX) 150 MG capsule Take 150 mg by mouth 2 (two) times daily.   Yes Historical Provider, MD  lisinopril (PRINIVIL,ZESTRIL) 20 MG tablet Take 20 mg by mouth daily.   Yes Historical Provider, MD  metFORMIN (GLUCOPHAGE-XR) 500 MG 24 hr tablet Take 1,000 mg by mouth 2 (two) times daily.   Yes Historical Provider, MD  metroNIDAZOLE (METROGEL) 0.75 % gel Apply 1 application topically 2 (two) times daily as needed (rosacea).   Yes Historical Provider, MD  mometasone (ELOCON) 0.1 % lotion Apply 1 application topically daily as needed (eczema). EAR ECZEMA   Yes Historical Provider, MD  omeprazole (PRILOSEC) 20 MG capsule Take 20 mg by mouth 2 (two) times daily.   Yes Historical Provider, MD  torsemide (DEMADEX) 20 MG tablet Take 20 mg by mouth 2 (two) times daily.   Yes Historical Provider, MD  venlafaxine (EFFEXOR) 75 MG tablet Take 75 mg by mouth 2 (two) times daily.    Yes Historical Provider, MD  warfarin (COUMADIN) 5 MG tablet Take 5-7.5 mg by mouth daily. Take 5mg  on Monday and Friday. All other days take 7.5mg .   Yes Historical Provider, MD    Scheduled Meds: . [START ON 08/25/2013] aspirin EC  81 mg Oral Daily  . carvedilol  25 mg Oral BID WC  . insulin aspart  0-15 Units Subcutaneous TID WC  . insulin aspart  0-5 Units Subcutaneous QHS  . insulin glargine  10 Units Subcutaneous NOW    . [START ON 08/25/2013] insulin glargine  20 Units Subcutaneous Daily  . insulin glargine  50 Units Subcutaneous QHS  .  lisinopril  20 mg Oral Daily  . nitroGLYCERIN  0.5 inch Topical 4 times per day  . pantoprazole  40 mg Oral Daily  . polyethylene glycol  17 g Oral Daily  . rosuvastatin  20 mg Oral q1800  . sodium chloride  3 mL Intravenous Q12H  . venlafaxine  150 mg Oral BID WC   Infusions: . heparin     PRN Meds: sodium chloride, bisacodyl, HYDROcodone-acetaminophen, sodium chloride   Allergies as of 08/21/2013 - Review Complete 07/20/2013  Allergen Reaction Noted  . Codeine  06/06/2009  . Tylox [oxycodone-acetaminophen]  09/18/2012    Family History  Problem Relation Age of Onset  . Lung cancer Father   . Coronary artery disease Father   . Diabetes Father   . Lung cancer Mother   . Asthma Brother   . Other Grandchild     Grandchild with tetralogy of Fallot and Hirschsprng disease    History   Social History  . Marital Status: Legally Separated    Spouse Name: N/A    Number of Children: N/A  . Years of Education: N/A   Occupational History  . Not on file.   Social History Main Topics  . Smoking status: Never Smoker   . Smokeless tobacco: Never Used  . Alcohol Use: No  . Drug Use: No  . Sexual Activity: Not Currently   Other Topics Concern  . Not on file   Social History Narrative  . No narrative on file    PHYSICAL EXAM: Vital signs in last 24 hours: Filed Vitals:   08/24/13 0744  BP: 129/57  Pulse: 68  Temp: 97.7 F (36.5 C)  Resp: 18   Wt Readings from Last 3 Encounters:  08/24/13 116.2 kg (256 lb 2.8 oz)  08/24/13 116.2 kg (256 lb 2.8 oz)  07/20/13 116.121 kg (256 lb)    General: obese, pleasant, comfortable.  Not looking acutely ill or frail Head:  No swelling or asymmetry  Eyes:  No icterus or pallor Ears:  + HOH  Nose:  No discharge Mouth:  Clear, moist, no sores Neck:  No mass, no JVD, no TMG Lungs:  Clear, no dyspnea or  cough Heart: Irreg, Irreg, not tachy or bradycardic.  Abdomen:  Obese, soft, no mass, no HSM.  BS active..   Rectal: no stool or masses, no blood   Musc/Skeltl: no joint contractures Extremities:  + pedal edema  Neurologic:  Oriented x 3.  No tremor.  No limb weakness.  Skin:  No telangectasia or rash Tattoos:  none Nodes:  No cervical adenopathy   Psych:  Pleasant, relaxed, not depressed.  LAB RESULTS:  Recent Labs  08/22/13 0406 08/23/13 0415 08/24/13 0357  WBC 11.5* 10.3 8.1  HGB 8.6* 8.3* 8.3*  HCT 29.7* 29.0* 28.1*  PLT 342 300 273  MCV    71  BMET Lab Results  Component Value Date   NA 134* 08/24/2013   NA 139 08/23/2013   NA 139 08/22/2013   K 4.2 08/24/2013   K 4.0 08/23/2013   K 4.1 08/22/2013   CL 94* 08/24/2013   CL 98 08/23/2013   CL 98 08/22/2013   CO2 29 08/24/2013   CO2 29 08/23/2013   CO2 25 08/22/2013   GLUCOSE 203* 08/24/2013   GLUCOSE 178* 08/23/2013   GLUCOSE 66* 08/22/2013   BUN 24* 08/24/2013   BUN 21 08/23/2013   BUN 22 08/22/2013   CREATININE 1.39* 08/24/2013   CREATININE 1.42* 08/23/2013  CREATININE 1.29* 08/22/2013   CALCIUM 9.1 08/24/2013   CALCIUM 9.0 08/23/2013   CALCIUM 9.1 08/22/2013   LFT  Recent Labs  08/22/13 0406 08/23/13 0415  PROT 7.2 7.2  ALBUMIN 3.1* 3.2*  AST 32 31  ALT 14 13  ALKPHOS 83 84  BILITOT 0.3 0.4   PT/INR Lab Results  Component Value Date   INR 1.91* 08/24/2013   INR 2.15* 08/23/2013   INR 2.28* 08/22/2013    ENDOSCOPIC STUDIES: See HPI.   IMPRESSION:   *  Microcytic anemia.  On po Iron for last 2 to 3 months.   *  Hx of colono polyps, type and date of occurrence not determined.  Surveillance colonoscopy of last 4 years showed no polyps.  This info all per pt and extensive family and they seem reliable.  Has not received call back letter from Dr Leanora Ivanoff, endoscopist.  Chronic constipation, decades old issue, and occasional BPR with hard stools.   *  Hx DU and treatment for HPylori ~ 2000.  On daily  Omeprazole.    *  Non STEMI.  For diagnostic cath today  *  Afib, chronic Coumadin.   *  IDDM  *  Hx fatty liver. Per chart.     PLAN:     *  Per Dr Ardis Hughs.  EGD  tomorrow.    Vena Rua  08/24/2013, 9:52 AM Pager: [phone]    ________________________________________________________________________  Velora Heckler GI MD note:  I personally examined the patient, reviewed the data and agree with the assessment and plan described above.  Microcytic, heme + IDA. She had colonososcopy (several times), last was 3-4 years ago. She does not need repeat now and in fact with recent AMI I would only advise colonoscopy if she has significant overt lower GI bleeding. EGD is probably safer in current context and I recommended we proceed with that tomorrow.     Owens Loffler, MD [facility] Pager [phone]

## 2013-08-25 NOTE — Significant Event (Signed)
SATURATION QUALIFICATIONS: (This note is used to comply with regulatory documentation for home oxygen)  Patient Saturations on Room Air at Rest = 91%  Patient Saturations on Room Air while Ambulating = 86%  Patient Saturations on 1 Liters of oxygen while Ambulating = 95%  Please briefly explain why patient needs home oxygen:desats on RA

## 2013-08-25 NOTE — Op Note (Signed)
[facility] [place], [address]   ENDOSCOPY PROCEDURE REPORT  PATIENT: Victoria Adams, Victoria Adams  MR#: [id number] BIRTHDATE: 04-04-1941 , 73  yrs. old GENDER: Female ENDOSCOPIST: Milus Banister, MD PROCEDURE DATE:  08/25/2013 PROCEDURE:  EGD, diagnostic ASA CLASS:     Class IV INDICATIONS:  heme positive anemia, microcytic; colonsocopy 3-4 years ago normal per patient and family. MEDICATIONS: Fentanyl 50 mcg IV and Versed 3 mg IV TOPICAL ANESTHETIC: none  DESCRIPTION OF PROCEDURE: After the risks benefits and alternatives of the procedure were thoroughly explained, informed consent was obtained.  The Pentax Gastroscope [id number] endoscope was introduced through the mouth and advanced to the second portion of the duodenum. Without limitations.  The instrument was slowly withdrawn as the mucosa was fully examined.     The upper, middle and distal third of the esophagus were carefully inspected and no abnormalities were noted.  The z-line was well seen at the GEJ.  The endoscope was pushed into the fundus which was normal including a retroflexed view.  The antrum, gastric body, first and second part of the duodenum were unremarkable. Retroflexed views revealed no abnormalities.     The scope was then withdrawn from the patient and the procedure completed. COMPLICATIONS: There were no complications.  ENDOSCOPIC IMPRESSION: Normal EGD  RECOMMENDATIONS: She can resume any blood thinners that are felt to be needed to treat her heart disease.  She should stay on iron once daily.  Try to keep INR level from getting too elevated.  If she has any overt GI bleeding, please call.  She should take miralax one dose daily for her chronic constipation.   eSigned:  Milus Banister, MD 08/25/2013 10:58 AM

## 2013-08-25 NOTE — Progress Notes (Signed)
    Subjective:  No BM (only one small one earlier in hospital stay), no bleeding, no CP, no SOB.     Objective:  Vital Signs in the last 24 hours: Temp:  [97.3 F (36.3 C)-98.1 F (36.7 C)] 98.1 F (36.7 C) (05/16 0727) Pulse Rate:  [52-82] 70 (05/16 0727) Resp:  [17-18] 18 (05/16 0727) BP: (129-187)/(47-125) 147/47 mmHg (05/16 0727) SpO2:  [92 %-97 %] 95 % (05/16 0727) Weight:  [256 lb 13.4 oz (116.5 kg)] 256 lb 13.4 oz (116.5 kg) (05/16 0453)  Intake/Output from previous day: 05/15 0701 - 05/16 0700 In: 240 [P.O.:240] Out: 700 [Urine:700]   Physical Exam: General: Well developed, well nourished, in no acute distress. Head:  Normocephalic and atraumatic. Lungs: Clear to auscultation and percussion. Heart: Normal S1 and S2.  No murmur, rubs or gallops.  Abdomen: soft, non-tender, positive bowel sounds. Obese Extremities: No clubbing or cyanosis. No edema. Right radial access normal.  Neurologic: Alert and oriented x 3.    Lab Results:  Recent Labs  08/24/13 0357 08/25/13 0450  WBC 8.1 8.0  HGB 8.3* 8.4*  PLT 273 321    Recent Labs  08/24/13 0357 08/25/13 0450  NA 134* 132*  K 4.2 4.5  CL 94* 94*  CO2 29 24  GLUCOSE 203* 231*  BUN 24* 26*  CREATININE 1.39* 1.33*    Recent Labs  08/22/13 1038 08/22/13 1600  TROPONINI 4.17* 6.71*   Hepatic Function Panel  Recent Labs  08/23/13 0415  PROT 7.2  ALBUMIN 3.2*  AST 31  ALT 13  ALKPHOS 84  BILITOT 0.4  .   Telemetry: AFIB 70's no adverse rhythm Personally viewed.   Cardiac Studies:   Moderate diffuse mid LAD disease of 40-60% that could be hemodynamically significant. There is also a major Septal perforator branch with ostial ~90%.  Moderately reduced LVEF with relatively normal to mildly elevated EDP  Assessment/Plan:  73 yo female with PMH significant for chronic low back pain, chronic anemia, IDDM-Type 2, chronic Afib on warfarin, and supposed nonischemic dilated cardiomyopathy/HF (last  EF 40-45% in 2014) who was admitted 05/13 for likely acute on chronic HF decompensation as well as NSTEMI vs mild troponin elevation related to CHF exacerbation  -NSTEMI - peak T 6.7 -EGD today -CBC stable Hg 8.4 -if EGD unremarkable, ok for DC back on Warfarin if ok with GI. -ASA for MI (consider stopping once therapeutic INR -Close follow up with PCP.  -Cards f/u 1-2 weeks.   Candee Furbish 08/25/2013, 7:36 AM

## 2013-08-25 NOTE — Progress Notes (Addendum)
The patient ambulated with the RN without O2 and sats were in the mid 80's.  She feels very weak likely from deconditioning and anemia.  Adding O2 and will need at home.  Canceling discharge.  Will ask PT to evaluated.  Hgd stable at 8.4.  Giving iron today.    Tarri Fuller, [facility] 3:42 PM

## 2013-08-26 ENCOUNTER — Inpatient Hospital Stay (HOSPITAL_COMMUNITY): Payer: Medicare Other

## 2013-08-26 DIAGNOSIS — I5023 Acute on chronic systolic (congestive) heart failure: Secondary | ICD-10-CM | POA: Diagnosis not present

## 2013-08-26 DIAGNOSIS — R0602 Shortness of breath: Secondary | ICD-10-CM | POA: Diagnosis not present

## 2013-08-26 DIAGNOSIS — I214 Non-ST elevation (NSTEMI) myocardial infarction: Secondary | ICD-10-CM | POA: Diagnosis not present

## 2013-08-26 LAB — BASIC METABOLIC PANEL
BUN: 27 mg/dL — ABNORMAL HIGH (ref 6–23)
CHLORIDE: 90 meq/L — AB (ref 96–112)
CO2: 28 mEq/L (ref 19–32)
Calcium: 8.5 mg/dL (ref 8.4–10.5)
Creatinine, Ser: 1.52 mg/dL — ABNORMAL HIGH (ref 0.50–1.10)
GFR calc Af Amer: 39 mL/min — ABNORMAL LOW (ref >90)
GFR calc non Af Amer: 33 mL/min — ABNORMAL LOW (ref >90)
GLUCOSE: 186 mg/dL — AB (ref 70–99)
POTASSIUM: 4.4 meq/L (ref 3.7–5.3)
Sodium: 128 mEq/L — ABNORMAL LOW (ref 137–147)

## 2013-08-26 LAB — GLUCOSE, CAPILLARY
Glucose-Capillary: 179 mg/dL — ABNORMAL HIGH (ref 70–99)
Glucose-Capillary: 258 mg/dL — ABNORMAL HIGH (ref 70–99)
Glucose-Capillary: 281 mg/dL — ABNORMAL HIGH (ref 70–99)
Glucose-Capillary: 239 mg/dL — ABNORMAL HIGH (ref 70–99)

## 2013-08-26 LAB — PROTIME-INR
INR: 1.32 (ref 0.00–1.49)
PROTHROMBIN TIME: 16.1 s — AB (ref 11.6–15.2)

## 2013-08-26 LAB — CBC WITH DIFFERENTIAL/PLATELET
BASOS PCT: 1 % (ref 0–1)
Basophils Absolute: 0.1 10*3/uL (ref 0.0–0.1)
Eosinophils Absolute: 0.5 10*3/uL (ref 0.0–0.7)
Eosinophils Relative: 5 % (ref 0–5)
HCT: 28.4 % — ABNORMAL LOW (ref 36.0–46.0)
HEMOGLOBIN: 8.5 g/dL — AB (ref 12.0–15.0)
LYMPHS ABS: 2.3 10*3/uL (ref 0.7–4.0)
Lymphocytes Relative: 25 % (ref 12–46)
MCH: 21.1 pg — AB (ref 26.0–34.0)
MCHC: 29.9 g/dL — ABNORMAL LOW (ref 30.0–36.0)
MCV: 70.6 fL — AB (ref 78.0–100.0)
Monocytes Absolute: 1.1 10*3/uL — ABNORMAL HIGH (ref 0.1–1.0)
Monocytes Relative: 12 % (ref 3–12)
NEUTROS ABS: 5 10*3/uL (ref 1.7–7.7)
Neutrophils Relative %: 57 % (ref 43–77)
Platelets: 322 10*3/uL (ref 150–400)
RBC: 4.02 MIL/uL (ref 3.87–5.11)
RDW: 18.7 % — ABNORMAL HIGH (ref 11.5–15.5)
WBC: 9 10*3/uL (ref 4.0–10.5)

## 2013-08-26 LAB — MAGNESIUM: Magnesium: 2.3 mg/dL (ref 1.5–2.5)

## 2013-08-26 MED ORDER — MAGNESIUM CITRATE PO SOLN
1.0000 | Freq: Once | ORAL | Status: AC
  Administered 2013-08-26: 1 via ORAL
  Filled 2013-08-26: qty 296

## 2013-08-26 MED ORDER — WARFARIN SODIUM 10 MG PO TABS
10.0000 mg | ORAL_TABLET | Freq: Once | ORAL | Status: AC
  Administered 2013-08-26: 10 mg via ORAL
  Filled 2013-08-26: qty 1

## 2013-08-26 MED ORDER — SODIUM CHLORIDE 0.9 % IV SOLN
INTRAVENOUS | Status: AC
  Administered 2013-08-26: 19:00:00 via INTRAVENOUS

## 2013-08-26 NOTE — Progress Notes (Signed)
ANTICOAGULATION CONSULT NOTE - Initial Consult  Pharmacy Consult for Warfarin Indication: Hx Afib  Allergies  Allergen Reactions  . Codeine Nausea And Vomiting  . Tylox [Oxycodone-Acetaminophen]     Vision closing in & heart palpitations.     Patient Measurements: Height: 5\' 7"  (170.2 cm) Weight: 262 lb 12.6 oz (119.2 kg) (scale c) IBW/kg (Calculated) : 61.6  Vital Signs: Temp: 97.9 F (36.6 C) (05/17 0423) Temp src: Oral (05/17 0423) BP: 118/45 mmHg (05/16 2134) Pulse Rate: 68 (05/17 0423)  Labs:  Recent Labs  08/24/13 0357 08/25/13 0450 08/26/13 0525  HGB 8.3* 8.4* 8.5*  HCT 28.1* 28.4* 28.4*  PLT 273 321 322  LABPROT 21.3* 16.7* 16.1*  INR 1.91* 1.39 1.32  CREATININE 1.39* 1.33* 1.52*    Estimated Creatinine Clearance: 45.3 ml/min (by C-G formula based on Cr of 1.52).   Medical History: Past Medical History  Diagnosis Date  . Obesity   . Chronic low back pain     Secondary to DJD  . Vitamin D deficiency   . Fatty liver disease, nonalcoholic   . Type 2 diabetes mellitus   . Allergic rhinitis   . Essential hypertension, benign   . Chronic systolic heart failure   . GERD (gastroesophageal reflux disease)   . Coronary atherosclerosis of native coronary artery     LHC (5/15):  EF 40-45% global HK; long LAD 40-60%, ostial 1st major septal perf 80-90%, ostial CFX ?%, mid AVCFX extensive Ca2+, dist PDA 50%  . Osteopenia   . Depression   . Rosacea   . Mitral regurgitation   . NICM (nonischemic cardiomyopathy)     a. echo (5/15):  mod LVH, EF 20-25%, diff HK, severe LAE, mild RAE  . Atrial fibrillation   . NSTEMI (non-ST elevated myocardial infarction)     08/2013  . Atrial fibrillation   . 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 Iron deficiency anemia   . CKD (chronic kidney disease)     Assessment: 71 YOF who was [facility] to [facility] on 5/13 for cardiology evaluation in the setting if elevated troponins and CP. Warfarin PTA for hx Afib was  held for the INR to trend down to go to cath (INR 2.28 on admit). The patient underwent a cardiac cath on 5/15 which showed moderate diffuse mid LAD disease to be managed medically. While being worked up for cardiac issues, the patient was also noted to have anemia and FOB+. EGD done (5/16) which was normal and per GI, is okay to resume anticoagulation. Pharmacy has been consulted to resume warfarin for hx Afib.  AC: INR 1.32 after holding coumadin x 3 days + 10mg  given last night. Hgb 8.5, Plts 322. No bleeding noted.   Coumadin PTA 5mg  MF, 7.5 mg all other days   Goal of Therapy:  INR 2-3   Plan:  Warfarin 10 mg x 1 for an extra boost today. Can likely decrease to home doses tomorrow.  Daily PT/INR Will continue to monitor for any signs/symptoms of bleeding    Josphine Laffey B. Leitha Schuller, PharmD Clinical Pharmacist - Resident Phone: [phone] Pager: [phone] 08/26/2013 8:49 AM

## 2013-08-26 NOTE — Evaluation (Signed)
Physical Therapy Evaluation Patient Details Name: Victoria Adams MRN: [id number] DOB: December 10, 1940 Today's Date: 08/26/2013   History of Present Illness  73 yo female with PMH significant for chronic low back pain, chronic anemia, IDDM-Type 2, chronic Afib on warfarin, and supposed nonischemic dilated cardiomyopathy/HF (last EF 40-45% in 2014) who was admitted 05/13 for likely acute on chronic HF decompensation as well as NSTEMI vs mild troponin elevation related to CHF exacerbation.  Low hgb througout stay; on 5/17, Hgb 8.5  Clinical Impression   Pt admitted with above. Pt currently with functional limitations due to the deficits listed below (see PT Problem List).  Pt will benefit from skilled PT to increase their independence and safety with mobility to allow discharge to the venue listed below.       Follow Up Recommendations Home health PT;Supervision/Assistance - 24 hour    Equipment Recommendations  Rolling walker with 5" wheels;3in1 (PT)    Recommendations for Other Services       Precautions / Restrictions Precautions Precautions: Fall      Mobility  Bed Mobility               General bed mobility comments: EOB upon arrival  Transfers Overall transfer level: Needs assistance Equipment used: Rolling walker (2 wheeled) Transfers: Sit to/from Stand Sit to Stand: Min assist         General transfer comment: Cues for hand placement and safety  Ambulation/Gait Ambulation/Gait assistance: Min guard Ambulation Distance (Feet): 150 Feet Assistive device: Rolling walker (2 wheeled) Gait Pattern/deviations: Step-through pattern Gait velocity: slowed   General Gait Details: Cues for posture and RW use; 4 standing rest breaks during walk; Reported UE fatigue at end of walk  Stairs            Wheelchair Mobility    Modified Rankin (Stroke Patients Only)       Balance Overall balance assessment: Needs assistance         Standing balance support:  Bilateral upper extremity supported;During functional activity Standing balance-Leahy Scale: Fair Standing balance comment: Noted incr need for UE support on RW with incr distance amb                             Pertinent Vitals/Pain Ambulated on Room Air with noted O2 sats greater than or equal to 92% throughout walk;  Noted DOE 3/4 HR range in the low to mid 80s throughout    [place] expects to be discharged to:: Private residence Living Arrangements: Children Available Help at Discharge: Family;Available 24 hours/day Type of Home: House Home Access: Stairs to enter Entrance Stairs-Rails: None Entrance Stairs-Number of Steps: 2 (2STE at daughter's home; 8 STE at pt's home) Home Layout: One level Home Equipment: None Additional Comments: Plans to dc to daughter's home initially    Prior Function Level of Independence: Independent         Comments: though doing less recently; able to do her own ADLs     Hand Dominance        Extremity/Trunk Assessment   Upper Extremity Assessment: Overall WFL for tasks assessed           Lower Extremity Assessment: Overall WFL for tasks assessed (though noted fatigue with progressive amb)         Communication   Communication: No difficulties  Cognition Arousal/Alertness: Awake/alert Behavior During Therapy: WFL for tasks assessed/performed Overall Cognitive Status: Within Functional Limits for tasks  assessed                      General Comments      Exercises        Assessment/Plan    PT Assessment Patient needs continued PT services  PT Diagnosis Difficulty walking;Generalized weakness   PT Problem List Decreased activity tolerance;Decreased balance;Decreased mobility;Decreased knowledge of use of DME;Decreased knowledge of precautions;Cardiopulmonary status limiting activity;Pain;Obesity  PT Treatment Interventions DME instruction;Gait training;Stair training;Functional  mobility training;Therapeutic activities;Therapeutic exercise;Patient/family education   PT Goals (Current goals can be found in the Care Plan section) Acute Rehab PT Goals Patient Stated Goal: get better PT Goal Formulation: With patient Time For Goal Achievement: 09/02/13 Potential to Achieve Goals: Good    Frequency Min 3X/week   Barriers to discharge        Co-evaluation               End of Session   Activity Tolerance: Patient limited by fatigue;Patient tolerated treatment well Patient left: in bed;with call bell/phone within reach;with nursing/sitter in room;with family/visitor present Nurse Communication: Mobility status         Time: 4696-2952 PT Time Calculation (min): 24 min   Charges:   PT Evaluation $Initial PT Evaluation Tier I: 1 Procedure PT Treatments $Gait Training: 8-22 mins   PT G Codes:          [facility] [place] 08/26/2013, 4:27 PM  Roney Marion, [place] Pager [phone] Office [phone]

## 2013-08-26 NOTE — Progress Notes (Signed)
Patient ID: [name], female   DOB: June 02, 1940, 73 y.o.   MRN: [id number]    Subjective:    Fatigue and SOB with ambulating yesterday  Objective:   Temp:  [97.4 F (36.3 C)-98.2 F (36.8 C)] 97.9 F (36.6 C) (05/17 0423) Pulse Rate:  [62-98] 68 (05/17 0423) Resp:  [16-21] 18 (05/17 0423) BP: (118-191)/(45-92) 118/45 mmHg (05/16 2134) SpO2:  [93 %-98 %] 98 % (05/17 0423) Weight:  [258 lb 9.6 oz (117.3 kg)-262 lb 12.6 oz (119.2 kg)] 262 lb 12.6 oz (119.2 kg) (05/17 0423) Last BM Date: 08/23/13  Filed Weights   08/25/13 0453 08/25/13 0942 08/26/13 0423  Weight: 256 lb 13.4 oz (116.5 kg) 258 lb 9.6 oz (117.3 kg) 262 lb 12.6 oz (119.2 kg)    Intake/Output Summary (Last 24 hours) at 08/26/13 0912 Last data filed at 08/26/13 0729  Gross per 24 hour  Intake    240 ml  Output    600 ml  Net   -360 ml    Telemetry: afib [id number]  Exam:  General: NAD  Resp: CTAB  Cardiac: irreg, no m/r/g, no JVD  GI: abdomen soft, NT, ND  MSK: no LE edema  Neuro: no focal deficits  Psych: appropriate affect  Lab Results:  Basic Metabolic Panel:  Recent Labs Lab 08/24/13 0357 08/25/13 0450 08/26/13 0525  NA 134* 132* 128*  K 4.2 4.5 4.4  CL 94* 94* 90*  CO2 29 24 28   GLUCOSE 203* 231* 186*  BUN 24* 26* 27*  CREATININE 1.39* 1.33* 1.52*  CALCIUM 9.1 8.4 8.5  MG 2.2 2.3 2.3    Liver Function Tests:  Recent Labs Lab 08/22/13 0406 08/23/13 0415  AST 32 31  ALT 14 13  ALKPHOS 83 84  BILITOT 0.3 0.4  PROT 7.2 7.2  ALBUMIN 3.1* 3.2*    CBC:  Recent Labs Lab 08/24/13 0357 08/25/13 0450 08/26/13 0525  WBC 8.1 8.0 9.0  HGB 8.3* 8.4* 8.5*  HCT 28.1* 28.4* 28.4*  MCV 70.6* 70.1* 70.6*  PLT 273 321 322    Cardiac Enzymes:  Recent Labs Lab 08/22/13 0406 08/22/13 1038 08/22/13 1600  TROPONINI 1.45* 4.17* 6.71*    BNP:  Recent Labs  08/22/13 0406  PROBNP 1368.0*    Coagulation:  Recent Labs Lab 08/24/13 0357 08/25/13 0450 08/26/13 0525    INR 1.91* 1.39 1.32    ECG:   Medications:   Scheduled Medications: . aspirin EC  81 mg Oral Daily  . carvedilol  25 mg Oral BID WC  . ferrous sulfate  325 mg Oral Q breakfast  . insulin aspart  0-15 Units Subcutaneous TID WC  . insulin aspart  0-5 Units Subcutaneous QHS  . insulin glargine  20 Units Subcutaneous Daily  . insulin glargine  50 Units Subcutaneous QHS  . lisinopril  20 mg Oral Daily  . pantoprazole  40 mg Oral Daily  . polyethylene glycol  17 g Oral Daily  . rosuvastatin  20 mg Oral q1800  . sodium chloride  3 mL Intravenous Q12H  . venlafaxine  150 mg Oral BID WC  . warfarin  10 mg Oral ONCE-1800  . Warfarin - Pharmacist Dosing Inpatient   Does not apply q1800     Infusions: . sodium chloride       PRN Medications:  sodium chloride, bisacodyl, HYDROcodone-acetaminophen, sodium chloride     Assessment/Plan    73 yo female with PMH significant for chronic low back pain, chronic anemia, IDDM-Type  2, chronic Afib on warfarin, and supposed nonischemic dilated cardiomyopathy/HF (last EF 40-45% in 2014) who was admitted 05/13 for likely acute on chronic HF decompensation as well as NSTEMI vs mild troponin elevation related to CHF exacerbation  1. NSTEMI - peak trop 6.7, cath with moderate diffuse disease of LAD 40-60%, septal perforator with 90% lesion. LVEF 40-45% by LV gram. Overall no obvious culprit lesion. Thought to be combination of chronic stable CAD combined with anemia as cause of infarct. Patient also with anemia high risk to commit to DAPT. Medical management.  - 08/2013 echo LVEF 20-25%, diffuse hypokinesis.   2. Microcytic anemia - followed with help of GI, EGD without significant pathology. Multiple colonoscopies per GI notes, no indication for repeat at this time.  - Hgb stable last 3 days. From GI standpoint ok to restart coumadin.    3. Afib - rate control with beta blocker, initially coumadin held. Has been resumed after GI workup, no  contraindication from there standpoint per notes.   4. Fatigue - patient ambulated with fatigue yesterday, sats to 80s per notes. She reports overall chronic stable DOE, and that this is not far from her baseline. She is not on home O2 but may need at discharge - awaiting PT evaluation, will check CXR this morning  5. Hyponatremia - decrease in Na and Cl, increasing Cr. Suggests hypovolemic hyponatremia. She is negative 3.3 liters since admission.  - she is currently not on any diuretics. Her oral intake over the last few days per charting is very minimal. (240 mL per day).  - f/u CXR, if clear will give back some IVF  6. Constipation - report no BM since admission - will give dose of magnesium citrate.    Carlyle Dolly, M.D., F.A.C.C.

## 2013-08-27 ENCOUNTER — Encounter (HOSPITAL_COMMUNITY): Payer: Self-pay | Admitting: Gastroenterology

## 2013-08-27 DIAGNOSIS — I5022 Chronic systolic (congestive) heart failure: Secondary | ICD-10-CM

## 2013-08-27 DIAGNOSIS — I5023 Acute on chronic systolic (congestive) heart failure: Secondary | ICD-10-CM | POA: Diagnosis not present

## 2013-08-27 DIAGNOSIS — I509 Heart failure, unspecified: Secondary | ICD-10-CM

## 2013-08-27 DIAGNOSIS — D649 Anemia, unspecified: Secondary | ICD-10-CM | POA: Diagnosis not present

## 2013-08-27 DIAGNOSIS — I4891 Unspecified atrial fibrillation: Secondary | ICD-10-CM | POA: Diagnosis not present

## 2013-08-27 LAB — CBC WITH DIFFERENTIAL/PLATELET
Basophils Absolute: 0.1 10*3/uL (ref 0.0–0.1)
Basophils Relative: 1 % (ref 0–1)
EOS ABS: 0.3 10*3/uL (ref 0.0–0.7)
Eosinophils Relative: 3 % (ref 0–5)
HCT: 24.7 % — ABNORMAL LOW (ref 36.0–46.0)
Hemoglobin: 7.4 g/dL — ABNORMAL LOW (ref 12.0–15.0)
Lymphocytes Relative: 22 % (ref 12–46)
Lymphs Abs: 1.9 10*3/uL (ref 0.7–4.0)
MCH: 21 pg — ABNORMAL LOW (ref 26.0–34.0)
MCHC: 30 g/dL (ref 30.0–36.0)
MCV: 70.2 fL — ABNORMAL LOW (ref 78.0–100.0)
MONO ABS: 1.1 10*3/uL — AB (ref 0.1–1.0)
Monocytes Relative: 13 % — ABNORMAL HIGH (ref 3–12)
NEUTROS PCT: 61 % (ref 43–77)
Neutro Abs: 5.1 10*3/uL (ref 1.7–7.7)
Platelets: 273 10*3/uL (ref 150–400)
RBC: 3.52 MIL/uL — ABNORMAL LOW (ref 3.87–5.11)
RDW: 18.8 % — AB (ref 11.5–15.5)
WBC: 8.5 10*3/uL (ref 4.0–10.5)

## 2013-08-27 LAB — BASIC METABOLIC PANEL
BUN: 26 mg/dL — ABNORMAL HIGH (ref 6–23)
CO2: 25 meq/L (ref 19–32)
Calcium: 7.5 mg/dL — ABNORMAL LOW (ref 8.4–10.5)
Chloride: 92 mEq/L — ABNORMAL LOW (ref 96–112)
Creatinine, Ser: 1.39 mg/dL — ABNORMAL HIGH (ref 0.50–1.10)
GFR calc Af Amer: 43 mL/min — ABNORMAL LOW (ref >90)
GFR, EST NON AFRICAN AMERICAN: 37 mL/min — AB (ref >90)
GLUCOSE: 169 mg/dL — AB (ref 70–99)
POTASSIUM: 4.2 meq/L (ref 3.7–5.3)
Sodium: 129 mEq/L — ABNORMAL LOW (ref 137–147)

## 2013-08-27 LAB — HEMOGLOBIN AND HEMATOCRIT, BLOOD
HCT: 27.1 % — ABNORMAL LOW (ref 36.0–46.0)
Hemoglobin: 8.2 g/dL — ABNORMAL LOW (ref 12.0–15.0)

## 2013-08-27 LAB — PROTIME-INR
INR: 1.52 — ABNORMAL HIGH (ref 0.00–1.49)
PROTHROMBIN TIME: 17.9 s — AB (ref 11.6–15.2)

## 2013-08-27 LAB — GLUCOSE, CAPILLARY
Glucose-Capillary: 199 mg/dL — ABNORMAL HIGH (ref 70–99)
Glucose-Capillary: 232 mg/dL — ABNORMAL HIGH (ref 70–99)
Glucose-Capillary: 156 mg/dL — ABNORMAL HIGH (ref 70–99)
Glucose-Capillary: 171 mg/dL — ABNORMAL HIGH (ref 70–99)

## 2013-08-27 LAB — MAGNESIUM: Magnesium: 2.6 mg/dL — ABNORMAL HIGH (ref 1.5–2.5)

## 2013-08-27 MED ORDER — WARFARIN SODIUM 7.5 MG PO TABS
7.5000 mg | ORAL_TABLET | Freq: Once | ORAL | Status: AC
  Administered 2013-08-27: 7.5 mg via ORAL
  Filled 2013-08-27: qty 1

## 2013-08-27 NOTE — Progress Notes (Signed)
1350-1440 Pt just walked with PT less than hour ago and pt not up to walking now. Walked 150 ft on RA with sats above 90%. Pt has oxygen on now in bed and stated very tired. Has it on for comfort. Discussed with pt and daughter that once pt recovered from walk, may want to check sats on RA resting as some pts decrease at this time and do ok with walking. Pt stated that nurses have been checking sats resting several times. Reviewed CHF zones and discussed importance of daily weights and low sodium. Discussed MI restrictions. Pt very limited in activity per daughter. Pt stated has not been to grocery store in year and only walks in house. Holds to furniture, has chair that she sits in when cooking,etc.  We will follow up tomorrow. Graylon Good RN BSN 08/27/2013 2:46 PM

## 2013-08-27 NOTE — Progress Notes (Signed)
Patient evaluated for community based chronic disease management services with [facility] as a benefit of patient's [employer]. Spoke with patient at bedside to explain [facility] services.  Patient has a scale at home.  Can afford medications. Has no transportation concerns.  She declines services at this time.  She feels that she and her family can manage the new CHF education, but keep our services in mind in the event that her family would like some assistance.  Left contact information and [facility] literature at bedside. Made Inpatient Case Manager aware that [place] [facility] following. Of note, [facility] Management services does not replace or interfere with any services that are arranged by inpatient case management or social work.  For additional questions or referrals please contact Corliss Blacker BSN RN [facility] Liaison at [phone].

## 2013-08-27 NOTE — Progress Notes (Signed)
UR completed Madylin Fairbank K. Nahshon Reich, RN, BSN, [place], CCM  08/27/2013 12:39 PM

## 2013-08-27 NOTE — Progress Notes (Signed)
Physical Therapy Treatment Patient Details Name: Victoria Adams MRN: [id number] DOB: Nov 17, 1940 Today's Date: 08/27/2013    History of Present Illness 73 yo female with PMH significant for chronic low back pain, chronic anemia, IDDM-Type 2, chronic Afib on warfarin, and supposed nonischemic dilated cardiomyopathy/HF (last EF 40-45% in 2014) who was admitted 05/13 for likely acute on chronic HF decompensation as well as NSTEMI vs mild troponin elevation related to CHF exacerbation.  Low hgb througout stay; on 5/17, Hgb 8.5    PT Comments    Patient requires max encouragement from family and myself to participate in therapy. Recommend daily ambulation with staff  Follow Up Recommendations  Home health PT;Supervision/Assistance - 24 hour     Equipment Recommendations  Rolling walker with 5" wheels;3in1 (PT)    Recommendations for Other Services       Precautions / Restrictions Precautions Precautions: Fall    Mobility  Bed Mobility Overal bed mobility: Needs Assistance Bed Mobility: Sit to Supine;Supine to Sit     Supine to sit: Min assist Sit to supine: Min assist   General bed mobility comments: A for LEs out of bed   Transfers Overall transfer level: Needs assistance Equipment used: Rolling walker (2 wheeled)   Sit to Stand: Min assist         General transfer comment: Cues for hand placement and safety. A to ensure safety with balance  Ambulation/Gait Ambulation/Gait assistance: Min guard Ambulation Distance (Feet): 150 Feet Assistive device: Rolling walker (2 wheeled)   Gait velocity: slowed   General Gait Details: Cues for posture and RW use. Required several standing rest breaks.    Stairs            Wheelchair Mobility    Modified Rankin (Stroke Patients Only)       Balance                                    Cognition Arousal/Alertness: Awake/alert Behavior During Therapy: WFL for tasks assessed/performed Overall  Cognitive Status: Within Functional Limits for tasks assessed                      Exercises      General Comments        Pertinent Vitals/Pain no apparent distress     Home Living                      Prior Function            PT Goals (current goals can now be found in the care plan section) Progress towards PT goals: Progressing toward goals    Frequency  Min 3X/week    PT Plan Current plan remains appropriate    Co-evaluation             End of Session   Activity Tolerance: Patient limited by fatigue Patient left: in bed;with call bell/phone within reach;with family/visitor present     Time: 1334-1400 PT Time Calculation (min): 26 min  Charges:  $Gait Training: 8-22 mins $Therapeutic Activity: 8-22 mins                    G Codes:      Tonia Brooms Jarmarcus Wambold 08/27/2013, 2:10 PM 08/27/2013 [place] PTA [phone] pager [phone] office

## 2013-08-27 NOTE — Progress Notes (Signed)
73 yo female with PMH significant for chronic low back pain, chronic anemia, IDDM-Type 2, chronic Afib on warfarin, and supposed nonischemic dilated cardiomyopathy/HF (last EF 40-45% in 2014) who was admitted 05/13 for likely acute on chronic HF decompensation as well as NSTEMI vs mild troponin elevation related to CHF exacerbation   Subjective: No Chest pain some SOB with ambulation  Objective: Vital signs in last 24 hours: Temp:  [97.3 F (36.3 C)-97.9 F (36.6 C)] 97.3 F (36.3 C) (05/18 0601) Pulse Rate:  [61-69] 61 (05/18 0601) Resp:  [17-19] 19 (05/18 0601) BP: (120-124)/(58-64) 124/64 mmHg (05/18 0601) SpO2:  [98 %-99 %] 99 % (05/18 0601) Weight:  [268 lb 4.8 oz (121.7 kg)] 268 lb 4.8 oz (121.7 kg) (05/18 0601) Weight change: 9 lb 11.2 oz (4.4 kg) Last BM Date: 08/23/13 Intake/Output from previous day: +318 (-2613 since admit) wt 268 up from 254 05/17 0701 - 05/18 0700 In: 720 [P.O.:720] Out: 402 [Urine:402] Intake/Output this shift:    PE: General:Pleasant affect, NAD Skin:Warm and dry, brisk capillary refill HEENT:normocephalic, sclera clear, mucus membranes moist Neck:supple, no JVD Heart:irreg irreg without murmur, gallup, rub or click Lungs:clear without rales, rhonchi, or wheezes LEX:NTZGY, soft, non tender, + BS, do not palpate liver spleen or masses Ext:tr lower ext edema, 2+ pedal pulses, 2+ radial pulses Neuro:alert and oriented, MAE, follows commands, + facial symmetry  1 BM last pm, no obvious blood  Lab Results:  Recent Labs  08/26/13 0525 08/27/13 0524  WBC 9.0 8.5  HGB 8.5* 7.4*  HCT 28.4* 24.7*  PLT 322 273   BMET  Recent Labs  08/25/13 0450 08/26/13 0525  NA 132* 128*  K 4.5 4.4  CL 94* 90*  CO2 24 28  GLUCOSE 231* 186*  BUN 26* 27*  CREATININE 1.33* 1.52*  CALCIUM 8.4 8.5       Studies/Results: Dg Chest Port 1 View  08/26/2013   CLINICAL DATA:  sob  EXAM: PORTABLE CHEST - 1 VIEW  COMPARISON:  DG CHEST 1V PORT  dated 08/21/2013  FINDINGS: Stable cardiomegaly. The lungs are clear. No acute osseous abnormalities.  IMPRESSION: Stable cardiomegaly without acute cardiopulmonary disease.   Electronically Signed   By: Margaree Mackintosh M.D.   On: 08/26/2013 17:54    Medications: I have reviewed the patient's current medications. Scheduled Meds: . aspirin EC  81 mg Oral Daily  . carvedilol  25 mg Oral BID WC  . ferrous sulfate  325 mg Oral Q breakfast  . insulin aspart  0-15 Units Subcutaneous TID WC  . insulin aspart  0-5 Units Subcutaneous QHS  . insulin glargine  20 Units Subcutaneous Daily  . insulin glargine  50 Units Subcutaneous QHS  . lisinopril  20 mg Oral Daily  . pantoprazole  40 mg Oral Daily  . polyethylene glycol  17 g Oral Daily  . rosuvastatin  20 mg Oral q1800  . sodium chloride  3 mL Intravenous Q12H  . venlafaxine  150 mg Oral BID WC  . Warfarin - Pharmacist Dosing Inpatient   Does not apply q1800   Continuous Infusions: . sodium chloride 20 mL/hr at 08/27/13 0511   PRN Meds:.sodium chloride, bisacodyl, HYDROcodone-acetaminophen, sodium chloride  Assessment/Plan: 1. NSTEMI  - peak trop 6.7, cath with moderate diffuse disease of LAD 40-60%, septal perforator with 90% lesion. LVEF 40-45% by LV gram. Overall no obvious culprit lesion. Thought to be combination of chronic stable CAD combined with anemia as cause of  infarct. Patient also with anemia high risk to commit to DAPT. Medical management.  - 08/2013 echo LVEF 20-25%, diffuse hypokinesis.  2. Microcytic anemia  - followed with help of GI, EGD without significant pathology. Multiple colonoscopies per GI notes, no indication for repeat at this time. ? transfuse - Hgb was stable last 3 days now down 7.4/24.7 . From GI standpoint ok to restart coumadin--has rec'd 2 doses of coumadin no obvious blood in stool per pt.  3. Afib  - rate control with beta blocker, initially coumadin held. Has been resumed after GI workup, no  contraindication from there standpoint per notes--but now with drop in H/H ? Hold coumadin   4. Fatigue  - patient ambulated with fatigue yesterday, sats to 80s per notes. She reports overall chronic stable DOE, and that this is not far from her baseline. She is not on home O2 but will need at discharge SPO2 down to 86% with ambulation  - PT evaluation,  CXR yesterday with clear lungs.  5. Hyponatremia  - decrease in Na and Cl, increasing Cr. Suggests hypovolemic hyponatremia. She is negative 3.3 liters since admission.  - she is currently not on any diuretics. Her oral intake over the last few days per charting is very minimal. (240 mL per day).  - f/u CXR, if clear will give back some IVF - did receive some fluid  6. Constipation  - report no BM since admission  - will give dose of magnesium citrate. --successful BM last pm 7. CKD -increase of Cr. Yesterday recheck BMP.    LOS: 5 days   Time spent with pt. :15 minutes. Cecilie Kicks  Nurse Practitioner Certified Pager [phone] or after 5pm and on weekends call [phone] 08/27/2013, 8:17 AM  Agree with note written by Cecilie Kicks RNP   Pt admitted with CP/SOB. Trop + around 6. Mod LV dysfunction (EF 40-45% by cath, 20% by 2D). Has CAF on chronic coumadin A/C.  No further CP. Cath did not show a culprit lesion. Med Rx recommended. Will get [facility] to ambulate w/o walker and do sats.Lorretta Harp 08/27/2013 12:09 PM

## 2013-08-27 NOTE — Progress Notes (Signed)
Dr. Delane Ginger was notified the result of stat H&H. Will continue to monitor pt

## 2013-08-27 NOTE — Progress Notes (Signed)
Inpatient Diabetes Program Recommendations  AACE/ADA: New Consensus Statement on Inpatient Glycemic Control (2013)  Target Ranges:  Prepandial:   less than 140 mg/dL      Peak postprandial:   less than 180 mg/dL (1-2 hours)      Critically ill patients:  140 - 180 mg/dL  Results for [name], Victoria Adams (MRN [id number]) as of 08/27/2013 13:41  Ref. Range 08/26/2013 11:14 08/26/2013 16:09 08/26/2013 21:44 08/27/2013 05:56 08/27/2013 11:15  Glucose-Capillary Latest Range: 70-99 mg/dL 258 (H) 281 (H) 239 (H) 199 (H) 232 (H)   Inpatient Diabetes Program Recommendations Insulin - Meal Coverage: consider adding Novolog 4 units TID with meals per Glycemic Control Order-set (should be held if patient eats <50% of meals) Thank you  Raoul Pitch BSN, RN,CDE Inpatient Diabetes Coordinator [phone] (team pager)

## 2013-08-27 NOTE — Progress Notes (Signed)
[place] for Warfarin Indication: Hx Afib  Allergies  Allergen Reactions  . Codeine Nausea And Vomiting  . Tylox [Oxycodone-Acetaminophen]     Vision closing in & heart palpitations.     Patient Measurements: Height: 5\' 7"  (170.2 cm) Weight: 268 lb 4.8 oz (121.7 kg) IBW/kg (Calculated) : 61.6  Vital Signs: Temp: 98 F (36.7 C) (05/18 0837) Temp src: Oral (05/18 0837) BP: 92/64 mmHg (05/18 0837) Pulse Rate: 74 (05/18 0837)  Labs:  Recent Labs  08/25/13 0450 08/26/13 0525 08/27/13 0524  HGB 8.4* 8.5* 7.4*  HCT 28.4* 28.4* 24.7*  PLT 321 322 273  LABPROT 16.7* 16.1* 17.9*  INR 1.39 1.32 1.52*  CREATININE 1.33* 1.52* 1.39*    Estimated Creatinine Clearance: 50.2 ml/min (by C-G formula based on Cr of 1.39).   Medical History: Past Medical History  Diagnosis Date  . Obesity   . Chronic low back pain     Secondary to DJD  . Vitamin D deficiency   . Fatty liver disease, nonalcoholic   . Type 2 diabetes mellitus   . Allergic rhinitis   . Essential hypertension, benign   . Chronic systolic heart failure   . GERD (gastroesophageal reflux disease)   . Coronary atherosclerosis of native coronary artery     LHC (5/15):  EF 40-45% global HK; long LAD 40-60%, ostial 1st major septal perf 80-90%, ostial CFX ?%, mid AVCFX extensive Ca2+, dist PDA 50%  . Osteopenia   . Depression   . Rosacea   . Mitral regurgitation   . NICM (nonischemic cardiomyopathy)     a. echo (5/15):  mod LVH, EF 20-25%, diff HK, severe LAE, mild RAE  . Atrial fibrillation   . NSTEMI (non-ST elevated myocardial infarction)     08/2013  . Atrial fibrillation   . 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 Iron deficiency anemia   . CKD (chronic kidney disease)     Assessment: 35 YOF who was transferred from [facility] to [facility] on 5/13 for cardiology evaluation in the setting if elevated troponins and CP. Warfarin PTA for hx Afib was held for the INR to trend down to  go to cath (INR 2.28 on admit). The patient underwent a cardiac cath on 5/15 which showed moderate diffuse mid LAD disease to be managed medically. While being worked up for cardiac issues, the patient was also noted to have anemia and FOB+. EGD done (5/16) which was normal and per GI, is okay to resume anticoagulation. Pharmacy has been consulted to resume warfarin for hx Afib. INR today 1.52, Hg 7.4  Coumadin PTA 5mg  MF, 7.5 mg all other days   Goal of Therapy:  INR 2-3   Plan:  Warfarin 7.5 mg x 1 Daily PT/INR Will continue to monitor for any signs/symptoms of bleeding    Excell Seltzer, PharmD Clinical Pharmacist  Phone: [phone] Pager: [phone] 08/27/2013 10:37 AM

## 2013-08-28 DIAGNOSIS — I10 Essential (primary) hypertension: Secondary | ICD-10-CM

## 2013-08-28 DIAGNOSIS — I4891 Unspecified atrial fibrillation: Secondary | ICD-10-CM | POA: Diagnosis not present

## 2013-08-28 DIAGNOSIS — I5023 Acute on chronic systolic (congestive) heart failure: Secondary | ICD-10-CM | POA: Diagnosis not present

## 2013-08-28 DIAGNOSIS — I509 Heart failure, unspecified: Secondary | ICD-10-CM | POA: Diagnosis not present

## 2013-08-28 DIAGNOSIS — D649 Anemia, unspecified: Secondary | ICD-10-CM | POA: Diagnosis not present

## 2013-08-28 LAB — CBC WITH DIFFERENTIAL/PLATELET
BASOS ABS: 0.1 10*3/uL (ref 0.0–0.1)
Basophils Relative: 1 % (ref 0–1)
EOS ABS: 0.4 10*3/uL (ref 0.0–0.7)
Eosinophils Relative: 4 % (ref 0–5)
HEMATOCRIT: 27.6 % — AB (ref 36.0–46.0)
HEMOGLOBIN: 8.2 g/dL — AB (ref 12.0–15.0)
LYMPHS PCT: 21 % (ref 12–46)
Lymphs Abs: 2 10*3/uL (ref 0.7–4.0)
MCH: 20.9 pg — ABNORMAL LOW (ref 26.0–34.0)
MCHC: 29.7 g/dL — ABNORMAL LOW (ref 30.0–36.0)
MCV: 70.4 fL — ABNORMAL LOW (ref 78.0–100.0)
MONOS PCT: 14 % — AB (ref 3–12)
Monocytes Absolute: 1.3 10*3/uL — ABNORMAL HIGH (ref 0.1–1.0)
NEUTROS ABS: 5.6 10*3/uL (ref 1.7–7.7)
NEUTROS PCT: 60 % (ref 43–77)
Platelets: 302 10*3/uL (ref 150–400)
RBC: 3.92 MIL/uL (ref 3.87–5.11)
RDW: 19.1 % — AB (ref 11.5–15.5)
WBC: 9.4 10*3/uL (ref 4.0–10.5)

## 2013-08-28 LAB — PROTIME-INR
INR: 1.6 — AB (ref 0.00–1.49)
PROTHROMBIN TIME: 18.6 s — AB (ref 11.6–15.2)

## 2013-08-28 LAB — BASIC METABOLIC PANEL
BUN: 34 mg/dL — AB (ref 6–23)
CHLORIDE: 88 meq/L — AB (ref 96–112)
CO2: 23 meq/L (ref 19–32)
Calcium: 8.2 mg/dL — ABNORMAL LOW (ref 8.4–10.5)
Creatinine, Ser: 1.59 mg/dL — ABNORMAL HIGH (ref 0.50–1.10)
GFR calc Af Amer: 37 mL/min — ABNORMAL LOW (ref >90)
GFR calc non Af Amer: 32 mL/min — ABNORMAL LOW (ref >90)
Glucose, Bld: 169 mg/dL — ABNORMAL HIGH (ref 70–99)
POTASSIUM: 5.2 meq/L (ref 3.7–5.3)
SODIUM: 122 meq/L — AB (ref 137–147)

## 2013-08-28 LAB — CBC
HEMATOCRIT: 28 % — AB (ref 36.0–46.0)
Hemoglobin: 8.5 g/dL — ABNORMAL LOW (ref 12.0–15.0)
MCH: 21.1 pg — AB (ref 26.0–34.0)
MCHC: 30.4 g/dL (ref 30.0–36.0)
MCV: 69.5 fL — AB (ref 78.0–100.0)
Platelets: 312 10*3/uL (ref 150–400)
RBC: 4.03 MIL/uL (ref 3.87–5.11)
RDW: 19 % — ABNORMAL HIGH (ref 11.5–15.5)
WBC: 9.1 10*3/uL (ref 4.0–10.5)

## 2013-08-28 LAB — GLUCOSE, CAPILLARY
Glucose-Capillary: 139 mg/dL — ABNORMAL HIGH (ref 70–99)
Glucose-Capillary: 171 mg/dL — ABNORMAL HIGH (ref 70–99)
Glucose-Capillary: 187 mg/dL — ABNORMAL HIGH (ref 70–99)
Glucose-Capillary: 168 mg/dL — ABNORMAL HIGH (ref 70–99)

## 2013-08-28 LAB — MAGNESIUM: MAGNESIUM: 2.9 mg/dL — AB (ref 1.5–2.5)

## 2013-08-28 MED ORDER — WARFARIN SODIUM 10 MG PO TABS
10.0000 mg | ORAL_TABLET | Freq: Once | ORAL | Status: AC
  Administered 2013-08-28: 10 mg via ORAL
  Filled 2013-08-28: qty 1

## 2013-08-28 MED ORDER — MILRINONE IN DEXTROSE 20 MG/100ML IV SOLN
0.2500 ug/kg/min | INTRAVENOUS | Status: DC
  Administered 2013-08-28 – 2013-08-31 (×7): 0.25 ug/kg/min via INTRAVENOUS
  Filled 2013-08-28 (×9): qty 100

## 2013-08-28 MED ORDER — LISINOPRIL 10 MG PO TABS
10.0000 mg | ORAL_TABLET | Freq: Every day | ORAL | Status: DC
  Administered 2013-08-29 – 2013-09-05 (×8): 10 mg via ORAL
  Filled 2013-08-28 (×8): qty 1

## 2013-08-28 MED ORDER — FUROSEMIDE 10 MG/ML IJ SOLN
80.0000 mg | Freq: Two times a day (BID) | INTRAMUSCULAR | Status: DC
  Administered 2013-08-28 – 2013-08-31 (×6): 80 mg via INTRAVENOUS
  Filled 2013-08-28 (×8): qty 8

## 2013-08-28 MED ORDER — CARVEDILOL 12.5 MG PO TABS
12.5000 mg | ORAL_TABLET | Freq: Two times a day (BID) | ORAL | Status: DC
  Administered 2013-08-29 – 2013-09-04 (×13): 12.5 mg via ORAL
  Filled 2013-08-28 (×15): qty 1

## 2013-08-28 NOTE — Progress Notes (Signed)
[place] for Warfarin Indication: Hx Afib  Allergies  Allergen Reactions  . Codeine Nausea And Vomiting  . Tylox [Oxycodone-Acetaminophen]     Vision closing in & heart palpitations.     Patient Measurements: Height: 5\' 7"  (170.2 cm) Weight: 274 lb 4 oz (124.4 kg) IBW/kg (Calculated) : 61.6  Vital Signs: Temp: 97.4 F (36.3 C) (05/19 0422) Temp src: Oral (05/19 0422) BP: 130/46 mmHg (05/19 0942) Pulse Rate: 64 (05/19 0430)  Labs:  Recent Labs  08/26/13 0525 08/27/13 0524 08/27/13 1928 08/28/13 0540  HGB 8.5* 7.4* 8.2* 8.2*  HCT 28.4* 24.7* 27.1* 27.6*  PLT 322 273  --  302  LABPROT 16.1* 17.9*  --  18.6*  INR 1.32 1.52*  --  1.60*  CREATININE 1.52* 1.39*  --   --     Estimated Creatinine Clearance: 50.8 ml/min (by C-G formula based on Cr of 1.39).   Medical History: Past Medical History  Diagnosis Date  . Obesity   . Chronic low back pain     Secondary to DJD  . Vitamin D deficiency   . Fatty liver disease, nonalcoholic   . Type 2 diabetes mellitus   . Allergic rhinitis   . Essential hypertension, benign   . Chronic systolic heart failure   . GERD (gastroesophageal reflux disease)   . Coronary atherosclerosis of native coronary artery     LHC (5/15):  EF 40-45% global HK; long LAD 40-60%, ostial 1st major septal perf 80-90%, ostial CFX ?%, mid AVCFX extensive Ca2+, dist PDA 50%  . Osteopenia   . Depression   . Rosacea   . Mitral regurgitation   . NICM (nonischemic cardiomyopathy)     a. echo (5/15):  mod LVH, EF 20-25%, diff HK, severe LAE, mild RAE  . Atrial fibrillation   . NSTEMI (non-ST elevated myocardial infarction)     08/2013  . Atrial fibrillation   . 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 Iron deficiency anemia   . CKD (chronic kidney disease)     Assessment: 58 YOF who was transferred from [facility] on 5/13 for cardiology evaluation in the setting if elevated troponins and CP. Warfarin PTA for  hx Afib was held for the INR to trend down to go to cath. The patient underwent a cardiac cath on 5/15 which showed moderate diffuse mid LAD disease to be managed medically.   While being worked up for cardiac issues, the patient was also noted to have anemia and FOB+. EGD was done on 51/6 which was normal and per GI, is okay to resume anticoagulation. Warfarin resumed on 5/16 evening.   INR today remains SUBtherapeutic (INR 1.6 << 1.52, goal of 2-3). Hgb/Hct/Plt trending up - no overt s/sx of bleeding noted.  PTA dose was 7.5 mg daily EXCEPT for 5 mg on Mon/Fri. The patient and daughter were re-educated on warfarin today.   Goal of Therapy:  INR 2-3   Plan:  1. Warfarin 10 mg x 1 dose at 1800 today 2. Will continue to monitor for any signs/symptoms of bleeding and will follow up with PT/INR in the a.m.  Alycia Rossetti, PharmD, BCPS Clinical Pharmacist Pager: [phone] 08/28/2013 11:12 AM

## 2013-08-28 NOTE — Progress Notes (Signed)
Patient Name: Victoria Adams Date of Encounter: 08/28/2013     Principal Problem:   Acute on chronic systolic heart failure Active Problems:   Essential hypertension, benign   CARDIOMYOPATHY, DILATED   Atrial fibrillation   NSTEMI (non-ST elevated myocardial infarction)   IDA (iron deficiency anemia)   Heme positive stool    SUBJECTIVE Feels terrible. Having SOB and increased abdominal distention and LE swelling. Also some lightheadedness. Still feels constipated although did have a BM yesterday after mag ox.   CURRENT MEDS . aspirin EC  81 mg Oral Daily  . carvedilol  25 mg Oral BID WC  . ferrous sulfate  325 mg Oral Q breakfast  . insulin aspart  0-15 Units Subcutaneous TID WC  . insulin aspart  0-5 Units Subcutaneous QHS  . insulin glargine  20 Units Subcutaneous Daily  . insulin glargine  50 Units Subcutaneous QHS  . lisinopril  20 mg Oral Daily  . pantoprazole  40 mg Oral Daily  . polyethylene glycol  17 g Oral Daily  . rosuvastatin  20 mg Oral q1800  . sodium chloride  3 mL Intravenous Q12H  . venlafaxine  150 mg Oral BID WC  . warfarin  10 mg Oral ONCE-1800  . Warfarin - Pharmacist Dosing Inpatient   Does not apply q1800    OBJECTIVE  Filed Vitals:   08/27/13 2038 08/28/13 0422 08/28/13 0430 08/28/13 0942  BP: 105/57 125/45  130/46  Pulse: 79 52 64   Temp: 97.5 F (36.4 C) 97.4 F (36.3 C)    TempSrc: Oral Oral    Resp: 18 18    Height:      Weight:  274 lb 4 oz (124.4 kg)    SpO2: 97% 98%      Intake/Output Summary (Last 24 hours) at 08/28/13 1126 Last data filed at 08/28/13 1057  Gross per 24 hour  Intake   1823 ml  Output   1500 ml  Net    323 ml   Filed Weights   08/26/13 0423 08/27/13 0601 08/28/13 0422  Weight: 262 lb 12.6 oz (119.2 kg) 268 lb 4.8 oz (121.7 kg) 274 lb 4 oz (124.4 kg)    PHYSICAL EXAM  General: Pleasant, NAD. obese Neuro: Alert and oriented X 3. Moves all extremities spontaneously. Psych: Normal affect. HEENT:   Normal  Neck: Supple without bruits or +JVD. Lungs:  Resp regular and unlabored. Heart: irreg irreg, brady Abdomen: Soft, non-tender, distended, BS + x 4.  Extremities: No clubbing, cyanosis or 2+ edema bilaterally. DP/PT/Radials 2+ and equal bilaterally.  Accessory Clinical Findings  CBC  Recent Labs  08/27/13 0524 08/27/13 1928 08/28/13 0540  WBC 8.5  --  9.4  NEUTROABS 5.1  --  5.6  HGB 7.4* 8.2* 8.2*  HCT 24.7* 27.1* 27.6*  MCV 70.2*  --  70.4*  PLT 273  --  364   Basic Metabolic Panel  Recent Labs  08/26/13 0525 08/27/13 0524 08/28/13 0540  NA 128* 129*  --   K 4.4 4.2  --   CL 90* 92*  --   CO2 28 25  --   GLUCOSE 186* 169*  --   BUN 27* 26*  --   CREATININE 1.52* 1.39*  --   CALCIUM 8.5 7.5*  --   MG 2.3 2.6* 2.9*    TELE  Atrial fibrillation with slow VR and LBBB. HR 40-60s  Radiology/Studies  Dg Chest Port 1 View  08/26/2013   CLINICAL DATA:  sob  EXAM: PORTABLE CHEST - 1 VIEW  COMPARISON:  DG CHEST 1V PORT dated 08/21/2013  FINDINGS: Stable cardiomegaly. The lungs are clear. No acute osseous abnormalities.  IMPRESSION: Stable cardiomegaly without acute cardiopulmonary disease.     ASSESSMENT AND PLAN  73 yo female with PMH significant for chronic low back pain, chronic anemia, IDDM-Type 2, chronic Afib on warfarin, and supposed nonischemic dilated cardiomyopathy/HF (last EF 40-45% in 2014) who was admitted 05/13 for likely acute on chronic HF decompensation as well as NSTEMI   NSTEMI -peak trop 6.7.  -- S/p LHC on 08/24/13 with moderate diffuse disease of LAD 40-60%, septal perforator with 90% lesion. LVEF 40-45% by LV gram. Overall no obvious culprit lesion. Thought to be combination of chronic stable CAD combined with anemia as cause of infarct. No interventions and will continue with medical management.  -- 08/23/2013 echo LVEF 20-25%, diffuse hypokinesis.   Nonischemic dilated cardiomyopathy/HF (last EF 40-45% in 2014)/ CHF-  -- Patient taken off  IV Lasix and put on IV fluids for AKI.  She is now volume overloaded and up 6 lbs. Net 2L?  -- Discontinue IVF and consider adding back Lasix  Microcytic anemia with guaiac positive stools -- followed with help of GI, EGD without significant pathology. Multiple colonoscopies per GI notes, no indication for repeat at this time.  -- Hgb is stable @ 8.2/27.6.  From GI standpoint ok to restart coumadin.  Afib  -- Rate control with BB, initially coumadin held. Has been resumed after GI workup, no contraindication from there standpoint per notes. -- INR 1.6 today  Hypoxia -- patient ambulated with fatigue yesterday, sats to 80s per notes. She reports overall chronic stable DOE, and that this is not far from her baseline. She is not on home O2 but will need at discharge SPO2 down to 86% with ambulation   Hyponatremia  -- decrease in Na and Cl, increasing Cr. suggests hypovolemic hyponatremia. She is negative 2 liters since admission and was put on IVFs. Now volume overloaded -- BMET today pending  Constipation  -- A couple BMs yesterday after magnesium citrate, but does not feel relieved. Consider enema if still uncomfortable.  Acute on chronic kidney disease - pending BMET today.  -- Given IVF for AKI and now volume overloaded. Will discontinue  Hypermagnesemia - consider not giving mag citrate for constipation. Continue to monitor  Signed, Perry Mount PA-C  Pager [phone]  Agree with note by Lorretta Harp PA-CPt with prob mixed ISCM/ NISCM with EF moderately reduced. Followed by Dr. Domenic Polite as OP. She apparently was on Lasix 80 mg PO BID at home as well as Demadex 20 mg PO BID. We have held her diuretics because of ? Over diuresis and she has gained 12 # (doubt) but is clearly volume overloaded. Will start back lasix 80 mg IV BID and ask the CHF service to assist in her care and hopefully expediting D/C  Lorretta Harp, M.D., [place], [facility], [place]. [place], [place]  [id number]  [phone] 08/28/2013 2:29 PM

## 2013-08-28 NOTE — Consult Note (Signed)
Advanced Heart Failure Team Consult Note  Referring Physician: Dr Gwenlyn Found  Primary Physician: Dr Coralyn Mark  Primary Cardiologist:  Dr Domenic Polite   Reason for Consultation:   HPI:   The Heart Failure Team was asked to provide a consult due to volume overload complicated by worsening renal function.   [name] with PMH significant for chronic low back pain, chronic anemia, IDDM-Type 2, chronic Afib on warfarin, and supposed nonischemic dilated cardiomyopathy/HF (last EF 40-45% in 2014) who was admitted 05/13 for likely acute on chronic HF decompensation as well as NSTEMI vs mild troponin elevation related to CHF exacerbation.    She was transferred from [facility] to [facility] due to HF acute decompensated HF and possible NSTEMI. Peak troponin 6.7 Had LHC 08/24/13 with moderate diffuse mid LAD disease of 40-60% and  Septal perforator branch with ostial ~90%.   GI evaluated due to microcytic anemia. Had EGD with no evidence of bleeding or pathology. Coumadin was restarted.  She has been diuresing with  IV lasix with poor response and worsening renal function. Weight up 20 pounds since admit.   Complaining of increased dyspnea with exertion.   Review of Systems: [y] = yes, [ ]  = no   General: Weight gain [Y ]; Weight loss [ ] ; Anorexia [ ] ; Fatigue [Y ]; Fever [ ] ; Chills [ ] ; Weakness [ ]   Cardiac: Chest pain/pressure [ ] ; Resting SOB [ ] ; Exertional SOB Y ]; Orthopnea [Y ]; Pedal Edema [ Y]; Palpitations [ ] ; Syncope [ ] ; Presyncope [ ] ; Paroxysmal nocturnal dyspnea[ ]   Pulmonary: Cough [ ] ; Wheezing[ ] ; Hemoptysis[ ] ; Sputum [ ] ; Snoring [ ]   GI: Vomiting[ ] ; Dysphagia[ ] ; Melena[ ] ; Hematochezia [ ] ; Heartburn[ ] ; Abdominal pain [ ] ; Constipation [ ] ; Diarrhea [ ] ; BRBPR [ ]   GU: Hematuria[ ] ; Dysuria [ ] ; Nocturia[ ]   Vascular: Pain in legs with walking [ ] ; Pain in feet with lying flat [ ] ; Non-healing sores [ ] ; Stroke [ ] ; TIA [ ] ; Slurred speech [ ] ;  Neuro: Headaches[ ] ; Vertigo[ ] ; Seizures[  ]; Paresthesias[ ] ;Blurred vision [ ] ; Diplopia [ ] ; Vision changes [ ]   Ortho/Skin: Arthritis [ ] ; Joint pain [ ] ; Muscle pain [ ] ; Joint swelling [ ] ; Back Pain [ ] ; Rash [ ]   Psych: Depression[ ] ; Anxiety[ ]   Heme: Bleeding problems [ ] ; Clotting disorders [ ] ; Anemia [ Y]  Endocrine: Diabetes [ Y]; Thyroid dysfunction[ ]   Home Medications Prior to Admission medications   Medication Sig Start Date End Date Taking? Authorizing Provider  beta carotene w/minerals (OCUVITE) tablet Take 2 tablets by mouth daily.   Yes Historical Provider, MD  Calcium Carbonate-Vitamin D (CALCIUM + D PO) Take 1 tablet by mouth 2 (two) times daily.    Yes Historical Provider, MD  carvedilol (COREG) 25 MG tablet Take 37.5 mg by mouth 2 (two) times daily with a meal.   Yes Historical Provider, MD  digoxin (LANOXIN) 0.125 MG tablet Take 125 mcg by mouth daily.   Yes Historical Provider, MD  doxycycline (VIBRAMYCIN) 100 MG capsule Take 100-200 mg by mouth 2 (two) times daily as needed (rosacea).    Yes Historical Provider, MD  furosemide (LASIX) 20 MG tablet Take 40 mg by mouth 2 (two) times daily.   Yes Historical Provider, MD  HYDROcodone-acetaminophen (NORCO) 10-325 MG per tablet Take 1 tablet by mouth every 4 (four) hours as needed for moderate pain.    Yes Historical Provider, MD  insulin  glargine (LANTUS) 100 UNIT/ML injection Inject 70-90 Units into the skin 2 (two) times daily. 90 units every morning & 70 units every evening.   Yes Historical Provider, MD  iron polysaccharides (NIFEREX) 150 MG capsule Take 150 mg by mouth 2 (two) times daily.   Yes Historical Provider, MD  lisinopril (PRINIVIL,ZESTRIL) 20 MG tablet Take 20 mg by mouth daily.   Yes Historical Provider, MD  metFORMIN (GLUCOPHAGE-XR) 500 MG 24 hr tablet Take 1,000 mg by mouth 2 (two) times daily.   Yes Historical Provider, MD  metroNIDAZOLE (METROGEL) 0.75 % gel Apply 1 application topically 2 (two) times daily as needed (rosacea).   Yes Historical  Provider, MD  mometasone (ELOCON) 0.1 % lotion Apply 1 application topically daily as needed (eczema). EAR ECZEMA   Yes Historical Provider, MD  omeprazole (PRILOSEC) 20 MG capsule Take 20 mg by mouth 2 (two) times daily.   Yes Historical Provider, MD  torsemide (DEMADEX) 20 MG tablet Take 20 mg by mouth 2 (two) times daily.   Yes Historical Provider, MD  venlafaxine (EFFEXOR) 75 MG tablet Take 75 mg by mouth 2 (two) times daily.    Yes Historical Provider, MD  warfarin (COUMADIN) 5 MG tablet Take 5-7.5 mg by mouth daily. Take 5mg  on Monday and Friday. All other days take 7.5mg .   Yes Historical Provider, MD  aspirin EC 81 MG EC tablet Take 1 tablet (81 mg total) by mouth daily. 08/25/13   Tarri Fuller, PA-C  carvedilol (COREG) 25 MG tablet Take 1 tablet (25 mg total) by mouth 2 (two) times daily with a meal. 08/25/13   Tarri Fuller, PA-C  polyethylene glycol (MIRALAX / GLYCOLAX) packet Take 17 g by mouth daily. 08/25/13   Tarri Fuller, PA-C  rosuvastatin (CRESTOR) 20 MG tablet Take 1 tablet (20 mg total) by mouth daily at 6 PM. 08/25/13   Tarri Fuller, PA-C    Past Medical History: Past Medical History  Diagnosis Date  . Obesity   . Chronic low back pain     Secondary to DJD  . Vitamin D deficiency   . Fatty liver disease, nonalcoholic   . Type 2 diabetes mellitus   . Allergic rhinitis   . Essential hypertension, benign   . Chronic systolic heart failure   . GERD (gastroesophageal reflux disease)   . Coronary atherosclerosis of native coronary artery     LHC (5/15):  EF 40-45% global HK; long LAD 40-60%, ostial 1st major septal perf 80-90%, ostial CFX ?%, mid AVCFX extensive Ca2+, dist PDA 50%  . Osteopenia   . Depression   . Rosacea   . Mitral regurgitation   . NICM (nonischemic cardiomyopathy)     a. echo (5/15):  mod LVH, EF 20-25%, diff HK, severe LAE, mild RAE  . Atrial fibrillation   . NSTEMI (non-ST elevated myocardial infarction)     08/2013  . Atrial fibrillation   . Arthritis      OSTEO   IN SPINE  . Iron deficiency anemia   . CKD (chronic kidney disease)     Past Surgical History: Past Surgical History  Procedure Laterality Date  . Total abdominal hysterectomy w/ bilateral salpingoophorectomy      for dermoid tumor  . Appendectomy    . Cataract extraction    . Cholecystectomy    . Tonsillectomy    . Rotator cuff repair      RIGHT SHOULDER  . Breast biopsy      RIGHT  . Abdominal hysterectomy    .  Cardiac catheterization  2010    LAD 50%, CFX 30%, RCA 40%; EF by echo 25-35%  . Esophagogastroduodenoscopy N/A 08/25/2013    Procedure: ESOPHAGOGASTRODUODENOSCOPY (EGD);  Surgeon: Milus Banister, MD;  Location: [facility];  Service: Endoscopy;  Laterality: N/A;    Family History: Family History  Problem Relation Age of Onset  . Lung cancer Father   . Coronary artery disease Father   . Diabetes Father   . Lung cancer Mother   . Asthma Brother   . Other Grandchild     Grandchild with tetralogy of Fallot and Hirschsprng disease    Social History: History   Social History  . Marital Status: Legally Separated    Spouse Name: N/A    Number of Children: N/A  . Years of Education: N/A   Social History Main Topics  . Smoking status: Never Smoker   . Smokeless tobacco: Never Used  . Alcohol Use: No  . Drug Use: No  . Sexual Activity: Not Currently   Other Topics Concern  . None   Social History Narrative  . None    Allergies:  Allergies  Allergen Reactions  . Codeine Nausea And Vomiting  . Tylox [Oxycodone-Acetaminophen]     Vision closing in & heart palpitations.     Objective:    Vital Signs:   Temp:  [97.4 F (36.3 C)-98.6 F (37 C)] 98.6 F (37 C) (05/19 1440) Pulse Rate:  [52-79] 66 (05/19 1440) Resp:  [18] 18 (05/19 1440) BP: (105-130)/(45-57) 129/57 mmHg (05/19 1440) SpO2:  [96 %-98 %] 96 % (05/19 1440) Weight:  [274 lb 4 oz (124.4 kg)] 274 lb 4 oz (124.4 kg) (05/19 0422) Last BM Date: 08/27/13  Weight  change: Filed Weights   08/26/13 0423 08/27/13 0601 08/28/13 0422  Weight: 262 lb 12.6 oz (119.2 kg) 268 lb 4.8 oz (121.7 kg) 274 lb 4 oz (124.4 kg)    Intake/Output:   Intake/Output Summary (Last 24 hours) at 08/28/13 1629 Last data filed at 08/28/13 1440  Gross per 24 hour  Intake   1686 ml  Output   1500 ml  Net    186 ml     Physical Exam: General:  Chronically ill appearing. No resp difficulty HEENT: normal Neck: supple. JVP to ear. Carotids 2+ bilat; no bruits. No lymphadenopathy or thryomegaly appreciated. Cor: PMI nondisplaced. Irregular rate & rhythm. No rubs, gallops or murmurs. Lungs: clear Abdomen: soft, nontender, nondistended. No hepatosplenomegaly. No bruits or masses. Good bowel sounds. Extremities: no cyanosis, clubbing, rash, RLE and LLE 3+ edema Neuro: alert & orientedx3, cranial nerves grossly intact. moves all 4 extremities w/o difficulty. Affect pleasant   Telemetry:   Labs: Basic Metabolic Panel:  Recent Labs Lab 08/24/13 0357 08/25/13 0450 08/26/13 0525 08/27/13 0524 08/28/13 0540 08/28/13 1220  NA 134* 132* 128* 129*  --  122*  K 4.2 4.5 4.4 4.2  --  5.2  CL 94* 94* 90* 92*  --  88*  CO2 29 24 28 25   --  23  GLUCOSE 203* 231* 186* 169*  --  169*  BUN 24* 26* 27* 26*  --  34*  CREATININE 1.39* 1.33* 1.52* 1.39*  --  1.59*  CALCIUM 9.1 8.4 8.5 7.5*  --  8.2*  MG 2.2 2.3 2.3 2.6* 2.9*  --     Liver Function Tests:  Recent Labs Lab 08/22/13 0406 08/23/13 0415  AST 32 31  ALT 14 13  ALKPHOS 83 84  BILITOT 0.3 0.4  PROT 7.2 7.2  ALBUMIN 3.1* 3.2*   No results found for this basename: LIPASE, AMYLASE,  in the last 168 hours No results found for this basename: AMMONIA,  in the last 168 hours  CBC:  Recent Labs Lab 08/24/13 0357 08/25/13 0450 08/26/13 0525 08/27/13 0524 08/27/13 1928 08/28/13 0540 08/28/13 1220  WBC 8.1 8.0 9.0 8.5  --  9.4 9.1  NEUTROABS 4.6 4.8 5.0 5.1  --  5.6  --   HGB 8.3* 8.4* 8.5* 7.4* 8.2* 8.2*  8.5*  HCT 28.1* 28.4* 28.4* 24.7* 27.1* 27.6* 28.0*  MCV 70.6* 70.1* 70.6* 70.2*  --  70.4* 69.5*  PLT 273 321 322 273  --  302 312    Cardiac Enzymes:  Recent Labs Lab 08/22/13 0406 08/22/13 1038 08/22/13 1600  TROPONINI 1.45* 4.17* 6.71*    BNP: BNP (last 3 results)  Recent Labs  08/22/13 0406  PROBNP 1368.0*    CBG:  Recent Labs Lab 08/27/13 1636 08/27/13 2042 08/28/13 0643 08/28/13 1056 08/28/13 1614  GLUCAP 156* 171* 139* 171* 187*    Coagulation Studies:  Recent Labs  08/26/13 0525 08/27/13 0524 08/28/13 0540  LABPROT 16.1* 17.9* 18.6*  INR 1.32 1.52* 1.60*    Other results:  EKG:    Medications:     Current Medications: . aspirin EC  81 mg Oral Daily  . carvedilol  25 mg Oral BID WC  . ferrous sulfate  325 mg Oral Q breakfast  . furosemide  80 mg Intravenous BID  . insulin aspart  0-15 Units Subcutaneous TID WC  . insulin aspart  0-5 Units Subcutaneous QHS  . insulin glargine  20 Units Subcutaneous Daily  . insulin glargine  50 Units Subcutaneous QHS  . lisinopril  20 mg Oral Daily  . pantoprazole  40 mg Oral Daily  . rosuvastatin  20 mg Oral q1800  . sodium chloride  3 mL Intravenous Q12H  . venlafaxine  150 mg Oral BID WC  . warfarin  10 mg Oral ONCE-1800  . Warfarin - Pharmacist Dosing Inpatient   Does not apply q1800     Infusions:      Assessment/Plan    1. NSTEMI  - peak trop 6.7, cath with moderate diffuse disease of LAD 40-60%, septal perforator with 90% lesion. Overall no obvious culprit lesion.  Possible demand ischemia in the setting of volume overload.  Medical management.  - 08/2013 echo LVEF 20-25%, diffuse hypokinesis.  2. A/C systolic heart failure. EF 20-25%.  Worsening renal function concerning for cardiorenal syndrome. Volume status elevated. Add Milrinone 0.25 mcg to improve cardiac output. Continue IV lasix 80 mg bid. Cut back carvedilol to 12.5 mg twice a day.   2. Microcytic anemia  - followed with  help of GI, EGD without significant pathology. Multiple colonoscopies per GI notes, no indication for repeat at this time.  - Hgb was stable last 3 days.  From GI standpoint ok to restart coumadin--has rec'd 2 doses of coumadin no obvious blood in stool per pt.  3. Afib: Chronic.  - rate control with beta blocker, initially coumadin held. Has been resumed after GI workup, no contraindication from their standpoint per notes.   4.  Hyponatremia  - decrease in Na and Cl, increasing Cr. Suggests hypovolemic hyponatremia. She is negative 3.3 liters since admission.  -6.  CKD  -Creatinine trending up.  Length of Stay: 6  Amy D Clegg NP-C  08/28/2013, 4:29 PM  Advanced Heart Failure Team Pager [phone] (M-F;  7a - 4p)  Please contact [facility] for night-coverage after hours (4p -7a ) and weekends on [email or web address]  Patient seen with NP, agree with the above note.  1. Acute on chronic systolic CHF: Patient is very volume overloaded with elevated JVP.  Possible cardiorenal syndrome with rising creatinine.  EF 20-25%.  At this point, will add milrinone to try to augment cardiac output. Will diurese with Lasix 80 mg IV bid and will cut back on Coreg to 12.5 mg bid.  Will place PICC for co-ox and CVP (transfer to step down for CVP monitoring when PICC is in).  2. AKI on CKD: Creatinine rising, ?cardiorenal syndrome.  As above, adding milrinone.  Will cut lisinopril back to 10 mg daily for now.  Follow closely. She had cardiac cath so contrast nephropathy could be playing a role.  3. Atrial fibrillation: Chronic.  Coumadin restarted, hemoglobin stably low.  4. CAD: Moderate disease in LAD, does not explain degree of systolic dysfunction.  Medical management.  Rise in troponin may have been demand ischemia with volume overload.   BLOSSOM CRUME 08/28/2013 5:10 PM

## 2013-08-28 NOTE — Progress Notes (Signed)
CARDIAC REHAB PHASE I   PRE:  Rate/Rhythm: 60 Afib  BP:  Supine:   Sitting: 95/58  Standing:    SaO2: 95 RA  MODE:  Ambulation: 100 ft   POST:  Rate/Rhythm: 72  BP:  Supine:   Sitting: 88/41  Standing:    SaO2: 95 RA 1347-1414 Assisted X 2 and used walker to ambulate. Pt c/o of feeling weak all over with walking. She c/o of feeling bloated in abd.full of fluid and needs to have BM. Pt c/o of feeling like she was going to fall in the flor on the was back from walking. She went to recliner after walk, exhausted. Daughters present and call light in reach. BP down after walk.Rodney Langton RN 08/28/2013 2:10 PM

## 2013-08-29 ENCOUNTER — Inpatient Hospital Stay (HOSPITAL_COMMUNITY): Payer: Medicare Other

## 2013-08-29 DIAGNOSIS — I509 Heart failure, unspecified: Secondary | ICD-10-CM | POA: Diagnosis not present

## 2013-08-29 DIAGNOSIS — I5023 Acute on chronic systolic (congestive) heart failure: Secondary | ICD-10-CM | POA: Diagnosis not present

## 2013-08-29 LAB — GLUCOSE, CAPILLARY
Glucose-Capillary: 154 mg/dL — ABNORMAL HIGH (ref 70–99)
Glucose-Capillary: 188 mg/dL — ABNORMAL HIGH (ref 70–99)
Glucose-Capillary: 133 mg/dL — ABNORMAL HIGH (ref 70–99)
Glucose-Capillary: 130 mg/dL — ABNORMAL HIGH (ref 70–99)

## 2013-08-29 LAB — CBC WITH DIFFERENTIAL/PLATELET
Basophils Absolute: 0.1 10*3/uL (ref 0.0–0.1)
Basophils Relative: 1 % (ref 0–1)
Eosinophils Absolute: 0.3 10*3/uL (ref 0.0–0.7)
Eosinophils Relative: 4 % (ref 0–5)
HEMATOCRIT: 27.1 % — AB (ref 36.0–46.0)
HEMOGLOBIN: 8.1 g/dL — AB (ref 12.0–15.0)
LYMPHS ABS: 1.7 10*3/uL (ref 0.7–4.0)
Lymphocytes Relative: 21 % (ref 12–46)
MCH: 20.8 pg — ABNORMAL LOW (ref 26.0–34.0)
MCHC: 29.9 g/dL — AB (ref 30.0–36.0)
MCV: 69.7 fL — ABNORMAL LOW (ref 78.0–100.0)
MONO ABS: 0.9 10*3/uL (ref 0.1–1.0)
MONOS PCT: 11 % (ref 3–12)
Neutro Abs: 5.1 10*3/uL (ref 1.7–7.7)
Neutrophils Relative %: 64 % (ref 43–77)
Platelets: 230 10*3/uL (ref 150–400)
RBC: 3.89 MIL/uL (ref 3.87–5.11)
RDW: 19.2 % — ABNORMAL HIGH (ref 11.5–15.5)
WBC: 8.1 10*3/uL (ref 4.0–10.5)

## 2013-08-29 LAB — PROTIME-INR
INR: 1.84 — ABNORMAL HIGH (ref 0.00–1.49)
Prothrombin Time: 20.7 seconds — ABNORMAL HIGH (ref 11.6–15.2)

## 2013-08-29 LAB — BASIC METABOLIC PANEL
BUN: 32 mg/dL — ABNORMAL HIGH (ref 6–23)
CHLORIDE: 90 meq/L — AB (ref 96–112)
CO2: 22 meq/L (ref 19–32)
Calcium: 8.1 mg/dL — ABNORMAL LOW (ref 8.4–10.5)
Creatinine, Ser: 1.65 mg/dL — ABNORMAL HIGH (ref 0.50–1.10)
GFR calc non Af Amer: 30 mL/min — ABNORMAL LOW (ref >90)
GFR, EST AFRICAN AMERICAN: 35 mL/min — AB (ref >90)
Glucose, Bld: 147 mg/dL — ABNORMAL HIGH (ref 70–99)
Potassium: 4.9 mEq/L (ref 3.7–5.3)
SODIUM: 124 meq/L — AB (ref 137–147)

## 2013-08-29 LAB — MAGNESIUM: Magnesium: 2.9 mg/dL — ABNORMAL HIGH (ref 1.5–2.5)

## 2013-08-29 LAB — CARBOXYHEMOGLOBIN
Carboxyhemoglobin: 1.6 % — ABNORMAL HIGH (ref 0.5–1.5)
Methemoglobin: 0.7 % (ref 0.0–1.5)
O2 SAT: 55.4 %
TOTAL HEMOGLOBIN: 8.1 g/dL — AB (ref 12.0–16.0)

## 2013-08-29 MED ORDER — SODIUM CHLORIDE 0.9 % IJ SOLN
10.0000 mL | Freq: Two times a day (BID) | INTRAMUSCULAR | Status: DC
  Administered 2013-08-29: 21 mL
  Administered 2013-08-29: 10 mL
  Administered 2013-08-30: 20 mL
  Administered 2013-08-30 – 2013-09-05 (×10): 10 mL

## 2013-08-29 MED ORDER — METOLAZONE 2.5 MG PO TABS
2.5000 mg | ORAL_TABLET | Freq: Every day | ORAL | Status: DC
  Administered 2013-08-29 – 2013-08-31 (×3): 2.5 mg via ORAL
  Filled 2013-08-29 (×4): qty 1

## 2013-08-29 MED ORDER — SODIUM CHLORIDE 0.9 % IJ SOLN
10.0000 mL | INTRAMUSCULAR | Status: DC | PRN
Start: 2013-08-29 — End: 2013-09-05

## 2013-08-29 MED ORDER — WARFARIN SODIUM 7.5 MG PO TABS
7.5000 mg | ORAL_TABLET | Freq: Once | ORAL | Status: AC
  Administered 2013-08-29: 7.5 mg via ORAL
  Filled 2013-08-29: qty 1

## 2013-08-29 NOTE — Progress Notes (Signed)
Patient transferred to [facility] with all belongings.  Report given to [facility].  Family at bedside went with patient.

## 2013-08-29 NOTE — Progress Notes (Signed)
Peripherally Inserted Central Catheter/Midline Placement  The IV Nurse has discussed with the patient and/or persons authorized to consent for the patient, the purpose of this procedure and the potential benefits and risks involved with this procedure.  The benefits include less needle sticks, lab draws from the catheter and patient may be discharged home with the catheter.  Risks include, but not limited to, infection, bleeding, blood clot (thrombus formation), and puncture of an artery; nerve damage and irregular heat beat.  Alternatives to this procedure were also discussed.  PICC/Midline Placement Documentation        Victoria Adams 08/29/2013, 3:22 PM

## 2013-08-29 NOTE — Progress Notes (Signed)
Advanced Heart Failure Rounding Note   Subjective:    [name] with PMH significant for chronic low back pain, chronic anemia, IDDM-Type 2, chronic Afib on warfarin, and supposed nonischemic dilated cardiomyopathy/HF (last EF 40-45% in 2014) who was admitted 05/13 for likely acute on chronic HF decompensation as well as NSTEMI vs mild troponin elevation related to CHF exacerbation.    She was transferred from [facility] to [facility] due to HF acute decompensated HF and possible NSTEMI. Peak troponin 6.7 Had LHC 08/24/13 with moderate diffuse mid LAD disease of 40-60% and Septal perforator branch with ostial ~90%.  GI evaluated due to microcytic anemia. Had EGD with no evidence of bleeding or pathology. Coumadin was restarted. She has been diuresing with IV lasix with poor response and worsening renal function. Weight up 20 pounds since admit.   ECHO 08/23/13 EF 20-25% RV normal.   Yesterday she was started on Milrinone 0.25 mcg and IV Lasix was resumed. 24 hour I/O -1.0L.  Remains SOB with exertion. Weight up 1 pound.   Creatinine 1.5>1.6 INR 1.8   Objective:   Weight Range:  Vital Signs:   Temp:  [97.4 F (36.3 C)-98.6 F (37 C)] 97.8 F (36.6 C) (05/20 0539) Pulse Rate:  [55-72] 66 (05/20 0539) Resp:  [18] 18 (05/20 0539) BP: (127-134)/(46-68) 127/68 mmHg (05/20 0539) SpO2:  [92 %-99 %] 92 % (05/20 0539) Weight:  [276 lb 0.3 oz (125.2 kg)] 276 lb 0.3 oz (125.2 kg) (05/20 0539) Last BM Date: 08/27/13  Weight change: Filed Weights   08/27/13 0601 08/28/13 0422 08/29/13 0539  Weight: 268 lb 4.8 oz (121.7 kg) 274 lb 4 oz (124.4 kg) 276 lb 0.3 oz (125.2 kg)    Intake/Output:   Intake/Output Summary (Last 24 hours) at 08/29/13 0929 Last data filed at 08/29/13 0600  Gross per 24 hour  Intake 1785.82 ml  Output   2270 ml  Net -484.18 ml     Physical Exam: General:  Chronically ill  appearing. No resp difficulty. Sitting in chair.  HEENT: normal Neck: supple. JVP to ear.  Carotids 2+ bilat; no bruits. No lymphadenopathy or thryomegaly appreciated. Cor: PMI nondisplaced. Regular rate & rhythm. No rubs, gallops or murmurs. Lungs: clear Abdomen: soft, nontender, nondistended. No hepatosplenomegaly. No bruits or masses. Good bowel sounds. Extremities: no cyanosis, clubbing, rash, R and LLE 2+ edema Neuro: alert & orientedx3, cranial nerves grossly intact. moves all 4 extremities w/o difficulty. Affect pleasant  Telemetry: Afib   Labs: Basic Metabolic Panel:  Recent Labs Lab 08/25/13 0450 08/26/13 0525 08/27/13 0524 08/28/13 0540 08/28/13 1220 08/29/13 0625  NA 132* 128* 129*  --  122* 124*  K 4.5 4.4 4.2  --  5.2 4.9  CL 94* 90* 92*  --  88* 90*  CO2 24 28 25   --  23 22  GLUCOSE 231* 186* 169*  --  169* 147*  BUN 26* 27* 26*  --  34* 32*  CREATININE 1.33* 1.52* 1.39*  --  1.59* 1.65*  CALCIUM 8.4 8.5 7.5*  --  8.2* 8.1*  MG 2.3 2.3 2.6* 2.9*  --  2.9*    Liver Function Tests:  Recent Labs Lab 08/23/13 0415  AST 31  ALT 13  ALKPHOS 84  BILITOT 0.4  PROT 7.2  ALBUMIN 3.2*   No results found for this basename: LIPASE, AMYLASE,  in the last 168 hours No results found for this basename: AMMONIA,  in the last 168 hours  CBC:  Recent  Labs Lab 08/25/13 0450 08/26/13 0525 08/27/13 0524 08/27/13 1928 08/28/13 0540 08/28/13 1220 08/29/13 0625  WBC 8.0 9.0 8.5  --  9.4 9.1 8.1  NEUTROABS 4.8 5.0 5.1  --  5.6  --  5.1  HGB 8.4* 8.5* 7.4* 8.2* 8.2* 8.5* 8.1*  HCT 28.4* 28.4* 24.7* 27.1* 27.6* 28.0* 27.1*  MCV 70.1* 70.6* 70.2*  --  70.4* 69.5* 69.7*  PLT 321 322 273  --  302 312 230    Cardiac Enzymes:  Recent Labs Lab 08/22/13 1038 08/22/13 1600  TROPONINI 4.17* 6.71*    BNP: BNP (last 3 results)  Recent Labs  08/22/13 0406  PROBNP 1368.0*     Other results:    Imaging:  No results found.   Medications:     Scheduled Medications: . aspirin EC  81 mg Oral Daily  . carvedilol  12.5 mg Oral BID WC  .  ferrous sulfate  325 mg Oral Q breakfast  . furosemide  80 mg Intravenous BID  . insulin aspart  0-15 Units Subcutaneous TID WC  . insulin aspart  0-5 Units Subcutaneous QHS  . insulin glargine  20 Units Subcutaneous Daily  . insulin glargine  50 Units Subcutaneous QHS  . lisinopril  10 mg Oral Daily  . pantoprazole  40 mg Oral Daily  . rosuvastatin  20 mg Oral q1800  . sodium chloride  3 mL Intravenous Q12H  . venlafaxine  150 mg Oral BID WC  . Warfarin - Pharmacist Dosing Inpatient   Does not apply q1800     Infusions: . milrinone 0.25 mcg/kg/min (08/29/13 0202)     PRN Medications:  sodium chloride, bisacodyl, HYDROcodone-acetaminophen, sodium chloride   Assessment/Plan    1. NSTEMI  - peak trop 6.7, cath with moderate diffuse disease of LAD 40-60%, septal perforator with 90% lesion. Overall no obvious culprit lesion. Possible demand ischemia in the setting of volume overload. Medical management.  - 08/2013 echo LVEF 20-25%, diffuse hypokinesis.  2. A/C systolic heart failure. EF 20-25%. Worsening renal function with evidence cardiorenal syndrome. Volume status elevated. Sluggish urine output. Weight up another pound.  Continue Milrinone 0.25 mc.  Continue IV lasix 80 mg bid and add 2.5 mg metolazone. . Cut back carvedilol to 12.5 mg twice a day.  Needs PICC for CVP and CO-OX.  Cardiac Rehab following.  2. Microcytic anemia  - followed with help of GI, EGD without significant pathology. Multiple colonoscopies per GI notes, no indication for repeat at this time.  - Hgb down al little 8.5>8.1 Watch closely. From GI standpoint ok to restart coumadin--has rec'd 2 doses of coumadin no obvious blood in stool per pt.  3. Afib: Chronic.  - rate controlled with beta blocker, initially coumadin held. Has been resumed after GI workup, no contraindication from their standpoint per notes. INR 1.8 Pharmacy following.  4. Hyponatremia  - decrease in Na and Cl, increasing Cr. Suggests  hypovolemic hyponatremia. She is negative 3.3 liters since admission.  -6. CKD  -Creatinine trending up.   Transfer to stepdown for CVP and CO-OX .   Length of Stay: 7 Amy D Clegg NP-C  08/29/2013, 9:29 AM  Advanced Heart Failure Team Pager [phone] (M-F; 7a - 4p)  Please contact [facility] for night-coverage after hours (4p -7a ) and weekends on [email or web address]   Patient seen and examined with Darrick Grinder, NP. We discussed all aspects of the encounter. I agree with the assessment and plan as stated above. She is responding to  IV diuresis with metolazone in the setting of milrinone. Agree with placement of central access (PICC vs CVL) to help guide therapy and eventually to wean inotropes. Agree with cutting back b-blocker.   Shaune Pascal Equan Cogbill,MD 2:05 PM

## 2013-08-29 NOTE — Progress Notes (Signed)
PT Cancellation Note  Patient Details Name: Victoria Adams MRN: [id number] DOB: 07/20/1940   Cancelled Treatment:    Reason Eval/Treat Not Completed: Patient at procedure or test/unavailable. Pt being transferred to [facility] at this time. Will re-attempt to see pt at next available time.    [place], [place] 08/29/2013, 2:35 PM

## 2013-08-29 NOTE — Progress Notes (Signed)
[place] for Warfarin Indication: Hx Afib  Allergies  Allergen Reactions  . Codeine Nausea And Vomiting  . Tylox [Oxycodone-Acetaminophen]     Vision closing in & heart palpitations.     Patient Measurements: Height: 5\' 7"  (170.2 cm) Weight: 276 lb 0.3 oz (125.2 kg) (scale c) IBW/kg (Calculated) : 61.6  Vital Signs: Temp: 97.8 F (36.6 C) (05/20 0539) Temp src: Oral (05/20 0539) BP: 127/68 mmHg (05/20 0539) Pulse Rate: 66 (05/20 0539)  Labs:  Recent Labs  08/27/13 0524  08/28/13 0540 08/28/13 1220 08/29/13 0625  HGB 7.4*  < > 8.2* 8.5* 8.1*  HCT 24.7*  < > 27.6* 28.0* 27.1*  PLT 273  --  302 312 230  LABPROT 17.9*  --  18.6*  --  20.7*  INR 1.52*  --  1.60*  --  1.84*  CREATININE 1.39*  --   --  1.59* 1.65*  < > = values in this interval not displayed.  Estimated Creatinine Clearance: 43 ml/min (by C-G formula based on Cr of 1.65).   Medical History: Past Medical History  Diagnosis Date  . Obesity   . Chronic low back pain     Secondary to DJD  . Vitamin D deficiency   . Fatty liver disease, nonalcoholic   . Type 2 diabetes mellitus   . Allergic rhinitis   . Essential hypertension, benign   . Chronic systolic heart failure   . GERD (gastroesophageal reflux disease)   . Coronary atherosclerosis of native coronary artery     LHC (5/15):  EF 40-45% global HK; long LAD 40-60%, ostial 1st major septal perf 80-90%, ostial CFX ?%, mid AVCFX extensive Ca2+, dist PDA 50%  . Osteopenia   . Depression   . Rosacea   . Mitral regurgitation   . NICM (nonischemic cardiomyopathy)     a. echo (5/15):  mod LVH, EF 20-25%, diff HK, severe LAE, mild RAE  . Atrial fibrillation   . NSTEMI (non-ST elevated myocardial infarction)     08/2013  . Atrial fibrillation   . 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 Iron deficiency anemia   . CKD (chronic kidney disease)     Assessment: 22 YOF who was transferred from [facility] to [facility] on 5/13  for cardiology evaluation in the setting if elevated troponins and CP. Pharmacy consulted to dose warfarin.  Events: wt up despite milrinone and IV lasix, to transfer to ICU  Coag/heme :Afib,  Warfarin trending up after 3d more than home dose PTA dose was 7.5 mg daily EXCEPT for 5 mg on Mon/Fri.   Noted to be FOB+, EGD 5/16  normal, and warfarin resumed 5/16 - however no bridging d/t this.  IDA. Iron panel on 5/13 showed Fe 13, Tsat 4%, ferritin 6 (all low). Hgb 8.2 << 7.4, plts wnl. Ferrous sulfate new this admit.  Infectious Disease: Afebrile, WBC wnl, on no abx  Cardiovascular: Hx HTN/sHF/Afib/CAD and admitted with NSTEMI s/p LHC on 5/15 which showed moderate diffuse mid LAD disease to be managed medically. EF 20-25% (08/23/13).  BP soft-wnl, HR low-wnl, wt trend up 2lb On ASA, coreg, lisinopril, crestor;  Mil@0 .25, IV lasix  Endocrinology: Hx DM. CBGs/24h: 100-252. On lantus 20 units qam, 50 units hs, moderate SSI ac/hs (11 units/24h). CDE left recs to adjust insulin> add 4 units TID meal coverage  Gastrointestinal / Nutrition: Hx GERD and noted FOB+ this admit. EGD done on 5/16 which was normal  and per GI okay to resume anticoag. On miralax, PPI po  Neurology: Hx depression. Pain score/24h: 2-5 (back). On effexor  Nephrology: SCr trend up, CrCl~40-45 ml/min (normalized). Na 124, other lytes okay  Pulmonary: 98% on RA  PTA Medication Issues: Metformin Best Practices: Warfarin  Goal of Therapy:  INR 2-3   Plan:  1. Warfarin 7.5 mg x 1 dose at 1800 today 2. Will continue to monitor for any signs/symptoms of bleeding and will follow up with PT/INR in the a.m.  Thank you for allowing pharmacy to be a part of this patients care team.  Rowe Robert Pharm.D., BCPS, AQ-Cardiology Clinical Pharmacist 08/29/2013 10:02 AM Pager: [phone] Phone: [phone]

## 2013-08-29 NOTE — Progress Notes (Signed)
CARDIAC REHAB PHASE I   PRE:  Rate/Rhythm: 71 afib    BP: sitting 129/58    SaO2: 94 RA  MODE:  Ambulation: 80 ft   POST:  Rate/Rhythm: 87 afib    BP: sitting 119/47     SaO2: 92 RA  Pt continues to be weak, fatigued, SOB. C/o arm fatigue with pushing RW, also hip fatigue toward end. To recliner with VSS. Daughter present.  [phone]   [place], ACSM 08/29/2013 10:01 AM

## 2013-08-29 NOTE — Progress Notes (Signed)
08/29/2013 patient transfer from  [facility] to [facility]00, she is alert, oriented and ambulatory with assist. Patient have edema in lower extremities and in bilateral arms. Bruises on both arms. Patient stated she fell 3 weeks ago, she is a little weak. Patient had a PICC line placed when arrived on unit, by iv team and the area is clean and dry.  [facility] RN.

## 2013-08-30 DIAGNOSIS — I214 Non-ST elevation (NSTEMI) myocardial infarction: Secondary | ICD-10-CM | POA: Diagnosis not present

## 2013-08-30 DIAGNOSIS — D649 Anemia, unspecified: Secondary | ICD-10-CM | POA: Diagnosis not present

## 2013-08-30 DIAGNOSIS — I5023 Acute on chronic systolic (congestive) heart failure: Secondary | ICD-10-CM | POA: Diagnosis not present

## 2013-08-30 DIAGNOSIS — I5022 Chronic systolic (congestive) heart failure: Secondary | ICD-10-CM | POA: Diagnosis not present

## 2013-08-30 DIAGNOSIS — I4891 Unspecified atrial fibrillation: Secondary | ICD-10-CM | POA: Diagnosis not present

## 2013-08-30 LAB — CBC WITH DIFFERENTIAL/PLATELET
Basophils Absolute: 0.1 10*3/uL (ref 0.0–0.1)
Basophils Relative: 1 % (ref 0–1)
EOS ABS: 0.4 10*3/uL (ref 0.0–0.7)
Eosinophils Relative: 5 % (ref 0–5)
HCT: 25.8 % — ABNORMAL LOW (ref 36.0–46.0)
Hemoglobin: 7.8 g/dL — ABNORMAL LOW (ref 12.0–15.0)
LYMPHS PCT: 25 % (ref 12–46)
Lymphs Abs: 1.8 10*3/uL (ref 0.7–4.0)
MCH: 21.2 pg — ABNORMAL LOW (ref 26.0–34.0)
MCHC: 30.2 g/dL (ref 30.0–36.0)
MCV: 70.1 fL — ABNORMAL LOW (ref 78.0–100.0)
MONOS PCT: 13 % — AB (ref 3–12)
Monocytes Absolute: 0.9 10*3/uL (ref 0.1–1.0)
Neutro Abs: 4 10*3/uL (ref 1.7–7.7)
Neutrophils Relative %: 56 % (ref 43–77)
PLATELETS: 269 10*3/uL (ref 150–400)
RBC: 3.68 MIL/uL — ABNORMAL LOW (ref 3.87–5.11)
RDW: 19.5 % — AB (ref 11.5–15.5)
WBC: 7.2 10*3/uL (ref 4.0–10.5)

## 2013-08-30 LAB — BASIC METABOLIC PANEL
BUN: 27 mg/dL — AB (ref 6–23)
CALCIUM: 8.3 mg/dL — AB (ref 8.4–10.5)
CO2: 27 meq/L (ref 19–32)
CREATININE: 1.56 mg/dL — AB (ref 0.50–1.10)
Chloride: 92 mEq/L — ABNORMAL LOW (ref 96–112)
GFR calc Af Amer: 37 mL/min — ABNORMAL LOW (ref >90)
GFR calc non Af Amer: 32 mL/min — ABNORMAL LOW (ref >90)
GLUCOSE: 109 mg/dL — AB (ref 70–99)
Potassium: 4.4 mEq/L (ref 3.7–5.3)
Sodium: 130 mEq/L — ABNORMAL LOW (ref 137–147)

## 2013-08-30 LAB — CARBOXYHEMOGLOBIN
Carboxyhemoglobin: 1.7 % — ABNORMAL HIGH (ref 0.5–1.5)
Methemoglobin: 0.8 % (ref 0.0–1.5)
O2 Saturation: 62 %
Total hemoglobin: 7.4 g/dL — ABNORMAL LOW (ref 12.0–16.0)

## 2013-08-30 LAB — PROTIME-INR
INR: 2.22 — ABNORMAL HIGH (ref 0.00–1.49)
Prothrombin Time: 23.9 seconds — ABNORMAL HIGH (ref 11.6–15.2)

## 2013-08-30 LAB — GLUCOSE, CAPILLARY
Glucose-Capillary: 88 mg/dL (ref 70–99)
Glucose-Capillary: 147 mg/dL — ABNORMAL HIGH (ref 70–99)
Glucose-Capillary: 160 mg/dL — ABNORMAL HIGH (ref 70–99)
Glucose-Capillary: 144 mg/dL — ABNORMAL HIGH (ref 70–99)

## 2013-08-30 LAB — MAGNESIUM: MAGNESIUM: 2.5 mg/dL (ref 1.5–2.5)

## 2013-08-30 MED ORDER — WARFARIN SODIUM 7.5 MG PO TABS
7.5000 mg | ORAL_TABLET | Freq: Once | ORAL | Status: AC
  Administered 2013-08-30: 7.5 mg via ORAL
  Filled 2013-08-30: qty 1

## 2013-08-30 NOTE — Progress Notes (Signed)
PT Cancellation Note  Patient Details Name: TIPHANY FAYSON MRN: [id number] DOB: 03-May-1940   Cancelled Treatment:    Reason Eval/Treat Not Completed: Fatigue limiting ability to participate. Pt reports sat up for 3 hours today and just returned to bed. States she has walked to bathroom today and balance is not good, but does not have the energy to work with PT right now.   Jeanie Cooks Hays Dunnigan 08/30/2013, 1:44 PM Pager [phone]

## 2013-08-30 NOTE — Progress Notes (Signed)
Advanced Heart Failure Rounding Note   Subjective:    [name] with PMH significant for chronic low back pain, chronic anemia, IDDM-Type 2, chronic Afib on warfarin, and supposed nonischemic dilated cardiomyopathy/HF (last EF 40-45% in 2014) who was admitted 05/13 for likely acute on chronic HF decompensation as well as NSTEMI vs mild troponin elevation related to CHF exacerbation.    She was transferred from [facility] to [facility] due to HF acute decompensated HF and possible NSTEMI. Peak troponin 6.7 Had LHC 08/24/13 with moderate diffuse mid LAD disease of 40-60% and Septal perforator branch with ostial ~90%.  GI evaluated due to microcytic anemia. Had EGD with no evidence of bleeding or pathology. Coumadin was restarted. She has been diuresing with IV lasix with poor response and worsening renal function. Weight up 20 pounds since admit.   ECHO 08/23/13 EF 20-25% RV normal.   Yesterday she continued on  Milrinone 0.25 mcg and IV Lasix.  Remains SOB with exertion. Weight down 4 pounds.    Creatinine 1.5>1.6>pending  INR 1.8 >2.2  Objective:   Weight Range:  Vital Signs:   Temp:  [97.5 F (36.4 C)-98.1 F (36.7 C)] 97.8 F (36.6 C) (05/21 0744) Pulse Rate:  [58-76] 76 (05/21 0744) Resp:  [16-21] 16 (05/21 0744) BP: (117-146)/(48-84) 122/48 mmHg (05/21 0744) SpO2:  [92 %-97 %] 96 % (05/21 0744) Weight:  [271 lb 2.7 oz (123 kg)] 271 lb 2.7 oz (123 kg) (05/21 0400) Last BM Date: 08/28/13  Weight change: Filed Weights   08/28/13 0422 08/29/13 0539 08/30/13 0400  Weight: 274 lb 4 oz (124.4 kg) 276 lb 0.3 oz (125.2 kg) 271 lb 2.7 oz (123 kg)    Intake/Output:   Intake/Output Summary (Last 24 hours) at 08/30/13 0916 Last data filed at 08/30/13 0718  Gross per 24 hour  Intake  693.3 ml  Output   6150 ml  Net -5456.7 ml     Physical Exam: CVP  General:  Chronically ill  appearing. No resp difficulty. Sitting in chair.  HEENT: normal Neck: supple. JVP to ear. Carotids 2+  bilat; no bruits. No lymphadenopathy or thryomegaly appreciated. Cor: PMI nondisplaced. Regular rate & rhythm. No rubs, gallops or murmurs. Lungs: clear Abdomen: soft, nontender, nondistended. No hepatosplenomegaly. No bruits or masses. Good bowel sounds. Extremities: no cyanosis, clubbing, rash, R and LLE 2+ edema Neuro: alert & orientedx3, cranial nerves grossly intact. moves all 4 extremities w/o difficulty. Affect pleasant  Telemetry: Afib   Labs: Basic Metabolic Panel:  Recent Labs Lab 08/25/13 0450 08/26/13 0525 08/27/13 0524 08/28/13 0540 08/28/13 1220 08/29/13 0625 08/30/13 0450  NA 132* 128* 129*  --  122* 124*  --   K 4.5 4.4 4.2  --  5.2 4.9  --   CL 94* 90* 92*  --  88* 90*  --   CO2 24 28 25   --  23 22  --   GLUCOSE 231* 186* 169*  --  169* 147*  --   BUN 26* 27* 26*  --  34* 32*  --   CREATININE 1.33* 1.52* 1.39*  --  1.59* 1.65*  --   CALCIUM 8.4 8.5 7.5*  --  8.2* 8.1*  --   MG 2.3 2.3 2.6* 2.9*  --  2.9* 2.5    Liver Function Tests: No results found for this basename: AST, ALT, ALKPHOS, BILITOT, PROT, ALBUMIN,  in the last 168 hours No results found for this basename: LIPASE, AMYLASE,  in the last 168  hours No results found for this basename: AMMONIA,  in the last 168 hours  CBC:  Recent Labs Lab 08/26/13 0525 08/27/13 0524 08/27/13 1928 08/28/13 0540 08/28/13 1220 08/29/13 0625 08/30/13 0450  WBC 9.0 8.5  --  9.4 9.1 8.1 7.2  NEUTROABS 5.0 5.1  --  5.6  --  5.1 4.0  HGB 8.5* 7.4* 8.2* 8.2* 8.5* 8.1* 7.8*  HCT 28.4* 24.7* 27.1* 27.6* 28.0* 27.1* 25.8*  MCV 70.6* 70.2*  --  70.4* 69.5* 69.7* 70.1*  PLT 322 273  --  302 312 230 269    Cardiac Enzymes: No results found for this basename: CKTOTAL, CKMB, CKMBINDEX, TROPONINI,  in the last 168 hours  BNP: BNP (last 3 results)  Recent Labs  08/22/13 0406  PROBNP 1368.0*     Other results:    Imaging: Dg Chest Port 1 View  08/29/2013   CLINICAL DATA:  Chest pain and shortness of  breath.  EXAM: PORTABLE CHEST - 1 VIEW  COMPARISON:  DG CHEST 1V PORT dated 08/26/2013  FINDINGS: The patient has undergone interval placement of a PICC line via the right upper extremity. The tip of the catheter lies in the midportion of the SVC. The cardiopericardial silhouette is enlarged and the pulmonary vascularity is engorged.  IMPRESSION: Mild CHF. No postprocedure complication following PICC line placement.   Electronically Signed   By: David  Martinique   On: 08/29/2013 16:25     Medications:     Scheduled Medications: . aspirin EC  81 mg Oral Daily  . carvedilol  12.5 mg Oral BID WC  . ferrous sulfate  325 mg Oral Q breakfast  . furosemide  80 mg Intravenous BID  . insulin aspart  0-15 Units Subcutaneous TID WC  . insulin aspart  0-5 Units Subcutaneous QHS  . insulin glargine  20 Units Subcutaneous Daily  . insulin glargine  50 Units Subcutaneous QHS  . lisinopril  10 mg Oral Daily  . metolazone  2.5 mg Oral Daily  . pantoprazole  40 mg Oral Daily  . rosuvastatin  20 mg Oral q1800  . sodium chloride  10-40 mL Intracatheter Q12H  . sodium chloride  3 mL Intravenous Q12H  . venlafaxine  150 mg Oral BID WC  . warfarin  7.5 mg Oral ONCE-1800  . Warfarin - Pharmacist Dosing Inpatient   Does not apply q1800    Infusions: . milrinone 0.25 mcg/kg/min (08/30/13 0500)    PRN Medications: sodium chloride, bisacodyl, HYDROcodone-acetaminophen, sodium chloride, sodium chloride   Assessment/Plan    1. NSTEMI  - peak trop 6.7, cath with moderate diffuse disease of LAD 40-60%, septal perforator with 90% lesion. Overall no obvious culprit lesion. Possible demand ischemia in the setting of volume overload. Medical management.  - 08/2013 echo LVEF 20-25%, diffuse hypokinesis.  2. A/C systolic heart failure. EF 20-25%. Volume status improving. Brisk diuresis. Weight down 4 pounds.  Continue Milrinone 0.25 mc.  Continue IV lasix 80 mg bid and continue. Renal function stable. Co-ox looks good,  2.5 mg metolazone daily. Continue reduced dose of carvedilol @ 12.5 mg twice a day.  Cardiac Rehab following.  3. Microcytic anemia : MCV 70- followed with help of GI, EGD without significant pathology. Multiple colonoscopies per GI notes.  Hgb continues to trend down. 8.5>8.1>7.8  Watch closely. She has been back on coumadin with no obvious blood in stool per pt.Contacted GI and will follow up.  4. Afib: Chronic.  - rate controlled with beta blocker, initially  coumadin held. Has been resumed after GI workup, no contraindication from their standpoint per notes. INR 2.2.  Pharmacy following.  5. Hyponatremia --> improved with milrinone  6. CKD stage 3 --stable  7.  DM- On insulin- CBG controlled.   Length of Stay: [place] NP-C  08/30/2013, 9:16 AM Advanced Heart Failure Team Pager [phone] (M-F; 7a - 4p)  Please contact [facility] for night-coverage after hours (4p -7a ) and weekends on [email or web address]  Patient seen and examined with Darrick Grinder, NP. We discussed all aspects of the encounter. I agree with the assessment and plan as stated above.  Volume status improving with IV lasix and milrinone. Co-ox and renal function look good. Have asked nursing staff to set-up CVP monitoring equipment so we can follow CVPs and co-ox. H/H still drifting down slowly. GI contacted and will f/u (thanks!).   Shaune Pascal Thi Klich,MD 11:47 AM

## 2013-08-30 NOTE — Progress Notes (Signed)
ANTICOAGULATION CONSULT NOTE - Follow Up Consult  Pharmacy Consult for warfarin Indication: atrial fibrillation  Allergies  Allergen Reactions  . Codeine Nausea And Vomiting  . Tylox [Oxycodone-Acetaminophen]     Vision closing in & heart palpitations.     Patient Measurements: Height: 5\' 7"  (170.2 cm) Weight: 271 lb 2.7 oz (123 kg) IBW/kg (Calculated) : 61.6  Vital Signs: Temp: 97.8 F (36.6 C) (05/21 0744) Temp src: Oral (05/21 0744) BP: 122/48 mmHg (05/21 0744) Pulse Rate: 76 (05/21 0744)  Labs:  Recent Labs  08/28/13 0540 08/28/13 1220 08/29/13 0625 08/30/13 0450  HGB 8.2* 8.5* 8.1* 7.8*  HCT 27.6* 28.0* 27.1* 25.8*  PLT 302 312 230 269  LABPROT 18.6*  --  20.7* 23.9*  INR 1.60*  --  1.84* 2.22*  CREATININE  --  1.59* 1.65*  --     Estimated Creatinine Clearance: 42.6 ml/min (by C-G formula based on Cr of 1.65).   Medications:  Scheduled:  . aspirin EC  81 mg Oral Daily  . carvedilol  12.5 mg Oral BID WC  . ferrous sulfate  325 mg Oral Q breakfast  . furosemide  80 mg Intravenous BID  . insulin aspart  0-15 Units Subcutaneous TID WC  . insulin aspart  0-5 Units Subcutaneous QHS  . insulin glargine  20 Units Subcutaneous Daily  . insulin glargine  50 Units Subcutaneous QHS  . lisinopril  10 mg Oral Daily  . metolazone  2.5 mg Oral Daily  . pantoprazole  40 mg Oral Daily  . rosuvastatin  20 mg Oral q1800  . sodium chloride  10-40 mL Intracatheter Q12H  . sodium chloride  3 mL Intravenous Q12H  . venlafaxine  150 mg Oral BID WC  . Warfarin - Pharmacist Dosing Inpatient   Does not apply q1800   Infusions:  . milrinone 0.25 mcg/kg/min (08/30/13 0500)    Assessment: 73 yo F with CHF transferred from [facility] to [facility] on 5/13 for cardiology evaluation in the setting of elevated troponins and CP, now s/p cath and decision to medically manage.  Pharmacy consulted to continue PTA warfarin. Of note, patient did have +FOB this admission, but EGD resulted  normal (can continue with anticoagulation per GI).  After increased doses from PTA regimen (of 10mg ) and s/p 7.5mg  for last night's dose, patient is now therapeutic with an INR of 2.22.  H/H is low with a trend downward.  No bleeding issues noted beyond above concerns.  PTA warfarin: 7.5 mg daily EXCEPT for 5 mg on Mon/Fri   Goal of Therapy:  INR 2-3 Monitor platelets by anticoagulation protocol: Yes   Plan:  - repeat warfarin 7.5mg  x 1 dose tonight - daily INR - daily CBC for now, monitor trend - continue to monitor for s/s of bleeding  Ovid Curd E. Jacqlyn Larsen, PharmD Clinical Pharmacist - Resident Pager: [phone] Pharmacy: [phone] 08/30/2013 8:05 AM

## 2013-08-30 NOTE — Consult Note (Signed)
Referring Provider: cardiology/BenshimonPrimary Care Physician:  Gar Ponto, MD Primary Gastroenterologist:  Dr.Jacobs  Reason for Consultation: Reconsult-microcytic anemia-drift in hgb  HPI: Victoria Adams is a 73 y.o. female and and and we are asked to reconsult on today by cardiology for microcytic anemia and drifting hemoglobin. Patient was initially seen a week ago by Dr. Ardis Hughs for the same issue and underwent an upper endoscopy on 08/22/2013 which was normal. This patient had had prior colonoscopies per Dr. Leanora Ivanoff, apparently her initial colonoscopy had shown polyps and then followup colonoscopy 3-4 years ago was negative. She was told to followup again in 5 years. Patient was told by her PCP that she was iron deficient within the past couple of months and had just started on oral iron on month or so ago. She is not aware of  hemoglobin value was however on admission here 08/22/2013 hemoglobin was 8.6. She was transferred here from [facility] on 513 with an acute on chronic exacerbation of congestive heart failure and and non-STE MI. She also has history of insulin-dependent diabetes mellitus, now should, and coronary artery disease. Most recent echo done last week shows an EF of 20-25% she has history of atrial fibrillation and has been maintained on Coumadin. She is back on Coumadin now and therapeutic with an INR of 2.2 Patient herself has not noticed any bleeding or any melena. She has been constipated now for several days and this is a chronic issue. She has no complaints of abdominal pain. Review of labs shows her hemoglobin on 08/26/2013 was 8.5, hemoglobin on 528.1 and hemoglobin today 7.8. Patient is also having multiple labs drawn each day. She remains quite fatigued and a short of breath with any exertion   Past Medical History  Diagnosis Date  . Obesity   . Chronic low back pain     Secondary to DJD  . Vitamin D deficiency   . Fatty liver disease, nonalcoholic    . Type 2 diabetes mellitus   . Allergic rhinitis   . Essential hypertension, benign   . Chronic systolic heart failure   . GERD (gastroesophageal reflux disease)   . Coronary atherosclerosis of native coronary artery     LHC (5/15):  EF 40-45% global HK; long LAD 40-60%, ostial 1st major septal perf 80-90%, ostial CFX ?%, mid AVCFX extensive Ca2+, dist PDA 50%  . Osteopenia   . Depression   . Rosacea   . Mitral regurgitation   . NICM (nonischemic cardiomyopathy)     a. echo (5/15):  mod LVH, EF 20-25%, diff HK, severe LAE, mild RAE  . Atrial fibrillation   . NSTEMI (non-ST elevated myocardial infarction)     08/2013  . Atrial fibrillation   . 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 Iron deficiency anemia   . CKD (chronic kidney disease)     Past Surgical History  Procedure Laterality Date  . Total abdominal hysterectomy w/ bilateral salpingoophorectomy      for dermoid tumor  . Appendectomy    . Cataract extraction    . Cholecystectomy    . Tonsillectomy    . Rotator cuff repair      RIGHT SHOULDER  . Breast biopsy      RIGHT  . Abdominal hysterectomy    . Cardiac catheterization  2010    LAD 50%, CFX 30%, RCA 40%; EF by echo 25-35%  . Esophagogastroduodenoscopy N/A 08/25/2013    Procedure: ESOPHAGOGASTRODUODENOSCOPY (EGD);  Surgeon: Melene Plan  Ardis Hughs, MD;  Location: [facility];  Service: Endoscopy;  Laterality: N/A;    Prior to Admission medications   Medication Sig Start Date End Date Taking? Authorizing Provider  beta carotene w/minerals (OCUVITE) tablet Take 2 tablets by mouth daily.   Yes Historical Provider, MD  Calcium Carbonate-Vitamin D (CALCIUM + D PO) Take 1 tablet by mouth 2 (two) times daily.    Yes Historical Provider, MD  carvedilol (COREG) 25 MG tablet Take 37.5 mg by mouth 2 (two) times daily with a meal.   Yes Historical Provider, MD  digoxin (LANOXIN) 0.125 MG tablet Take 125 mcg by mouth daily.   Yes Historical Provider, MD  doxycycline (VIBRAMYCIN) 100  MG capsule Take 100-200 mg by mouth 2 (two) times daily as needed (rosacea).    Yes Historical Provider, MD  furosemide (LASIX) 20 MG tablet Take 40 mg by mouth 2 (two) times daily.   Yes Historical Provider, MD  HYDROcodone-acetaminophen (NORCO) 10-325 MG per tablet Take 1 tablet by mouth every 4 (four) hours as needed for moderate pain.    Yes Historical Provider, MD  insulin glargine (LANTUS) 100 UNIT/ML injection Inject 70-90 Units into the skin 2 (two) times daily. 90 units every morning & 70 units every evening.   Yes Historical Provider, MD  iron polysaccharides (NIFEREX) 150 MG capsule Take 150 mg by mouth 2 (two) times daily.   Yes Historical Provider, MD  lisinopril (PRINIVIL,ZESTRIL) 20 MG tablet Take 20 mg by mouth daily.   Yes Historical Provider, MD  metFORMIN (GLUCOPHAGE-XR) 500 MG 24 hr tablet Take 1,000 mg by mouth 2 (two) times daily.   Yes Historical Provider, MD  metroNIDAZOLE (METROGEL) 0.75 % gel Apply 1 application topically 2 (two) times daily as needed (rosacea).   Yes Historical Provider, MD  mometasone (ELOCON) 0.1 % lotion Apply 1 application topically daily as needed (eczema). EAR ECZEMA   Yes Historical Provider, MD  omeprazole (PRILOSEC) 20 MG capsule Take 20 mg by mouth 2 (two) times daily.   Yes Historical Provider, MD  torsemide (DEMADEX) 20 MG tablet Take 20 mg by mouth 2 (two) times daily.   Yes Historical Provider, MD  venlafaxine (EFFEXOR) 75 MG tablet Take 75 mg by mouth 2 (two) times daily.    Yes Historical Provider, MD  warfarin (COUMADIN) 5 MG tablet Take 5-7.5 mg by mouth daily. Take 5mg  on Monday and Friday. All other days take 7.5mg .   Yes Historical Provider, MD  aspirin EC 81 MG EC tablet Take 1 tablet (81 mg total) by mouth daily. 08/25/13   Tarri Fuller, PA-C  carvedilol (COREG) 25 MG tablet Take 1 tablet (25 mg total) by mouth 2 (two) times daily with a meal. 08/25/13   Tarri Fuller, PA-C  polyethylene glycol (MIRALAX / GLYCOLAX) packet Take 17 g by mouth  daily. 08/25/13   Tarri Fuller, PA-C  rosuvastatin (CRESTOR) 20 MG tablet Take 1 tablet (20 mg total) by mouth daily at 6 PM. 08/25/13   Tarri Fuller, PA-C    Current Facility-Administered Medications  Medication Dose Route Frequency Provider Last Rate Last Dose  . 0.9 %  sodium chloride infusion  250 mL Intravenous PRN Charlton Haws, MD      . aspirin EC tablet 81 mg  81 mg Oral Daily Evelene Croon Barrett, PA-C   81 mg at 08/30/13 0936  . bisacodyl (DULCOLAX) suppository 10 mg  10 mg Rectal Daily PRN Evelene Croon Barrett, PA-C   10 mg at 08/23/13 1308  .  carvedilol (COREG) tablet 12.5 mg  12.5 mg Oral BID WC Amy D Clegg, NP   12.5 mg at 08/30/13 0932  . ferrous sulfate tablet 325 mg  325 mg Oral Q breakfast Tarri Fuller, PA-C   325 mg at 08/30/13 [id number]  . furosemide (LASIX) injection 80 mg  80 mg Intravenous BID Lorretta Harp, MD   80 mg at 08/30/13 [id number]  . HYDROcodone-acetaminophen (NORCO) 10-325 MG per tablet 1 tablet  1 tablet Oral Q6H PRN Charlton Haws, MD   1 tablet at 08/30/13 0439  . insulin aspart (novoLOG) injection 0-15 Units  0-15 Units Subcutaneous TID WC Brittainy Simmons, PA-C   2 Units at 08/29/13 1736  . insulin aspart (novoLOG) injection 0-5 Units  0-5 Units Subcutaneous QHS Brittainy Simmons, PA-C   3 Units at 08/26/13 2215  . insulin glargine (LANTUS) injection 20 Units  20 Units Subcutaneous Daily Evelene Croon Barrett, PA-C   20 Units at 08/29/13 1028  . insulin glargine (LANTUS) injection 50 Units  50 Units Subcutaneous QHS Evelene Croon Barrett, PA-C   50 Units at 08/29/13 2251  . lisinopril (PRINIVIL,ZESTRIL) tablet 10 mg  10 mg Oral Daily Larey Dresser, MD   10 mg at 08/30/13 0936  . metolazone (ZAROXOLYN) tablet 2.5 mg  2.5 mg Oral Daily Amy D Clegg, NP   2.5 mg at 08/30/13 0934  . milrinone (PRIMACOR) infusion 200 mcg/mL (0.2 mg/ml)  0.25 mcg/kg/min Intravenous Continuous Amy D Clegg, NP 9.3 mL/hr at 08/30/13 1034 0.25 mcg/kg/min at 08/30/13 1034  . pantoprazole (PROTONIX) EC tablet 40 mg   40 mg Oral Daily Charlton Haws, MD   40 mg at 08/30/13 0936  . rosuvastatin (CRESTOR) tablet 20 mg  20 mg Oral q1800 Manley Mason, RPH   20 mg at 08/29/13 1751  . sodium chloride 0.9 % injection 10-40 mL  10-40 mL Intracatheter Q12H Dorothy Spark, MD   10 mL at 08/30/13 1000  . sodium chloride 0.9 % injection 10-40 mL  10-40 mL Intracatheter PRN Dorothy Spark, MD      . sodium chloride 0.9 % injection 3 mL  3 mL Intravenous Q12H Charlton Haws, MD   3 mL at 08/30/13 1000  . sodium chloride 0.9 % injection 3 mL  3 mL Intravenous PRN Charlton Haws, MD   3 mL at 08/28/13 2257  . venlafaxine [facility]) tablet 150 mg  150 mg Oral BID WC Charlton Haws, MD   150 mg at 08/30/13 0936  . warfarin (COUMADIN) tablet 7.5 mg  7.5 mg Oral ONCE-1800 Rikki Spearing, RPH      . Warfarin - Pharmacist Dosing Inpatient   Does not apply [place], [facility]        Allergies as of 08/21/2013 - Review Complete 07/20/2013  Allergen Reaction Noted  . Codeine  06/06/2009  . Tylox [oxycodone-acetaminophen]  09/18/2012    Family History  Problem Relation Age of Onset  . Lung cancer Father   . Coronary artery disease Father   . Diabetes Father   . Lung cancer Mother   . Asthma Brother   . Other Grandchild     Grandchild with tetralogy of Fallot and Hirschsprng disease    History   Social History  . Marital Status: Legally Separated    Spouse Name: N/A    Number of Children: N/A  . Years of Education: N/A   Occupational History  . Not on file.   Social History Main Topics  .  Smoking status: Never Smoker   . Smokeless tobacco: Never Used  . Alcohol Use: No  . Drug Use: No  . Sexual Activity: Not Currently   Other Topics Concern  . Not on file   Social History Narrative  . No narrative on file    Review of Systems: As in HPI  Physical Exam: Vital signs in last 24 hours: Temp:  [97.5 F (36.4 C)-98.1 F (36.7 C)] 97.5 F (36.4 C) (05/21 1133) Pulse Rate:  [58-85] 71 (05/21  1133) Resp:  [16-21] 17 (05/21 1133) BP: (117-139)/(34-58) 123/51 mmHg (05/21 1133) SpO2:  [92 %-99 %] 99 % (05/21 1133) Weight:  [271 lb 2.7 oz (123 kg)] 271 lb 2.7 oz (123 kg) (05/21 0400) Last BM Date: 08/28/13 General:   Alert,  Well-developed, older WF,well-nourished, pleasant and cooperative in NAD Head:  Normocephalic and atraumatic. Eyes:  Sclera clear, no icterus.   Conjunctiva pale Ears:  Normal auditory acuity. Nose:  No deformity, discharge,  or lesions. Mouth:  No deformity or lesions.   Neck:  Supple; no masses or thyromegaly. Lungs: fine basilar rales Heart:  Regular rate and rhythm;sys murmur. Abdomen:  Soft,nontender, BS active,nonpalp mass or hsm.   Rectal:  Deferred  Msk:  Symmetrical without gross deformities. . Pulses:  Normal pulses noted. Extremities:  Without clubbing or edema. Neurologic:  Alert and  oriented x4;  grossly normal neurologically. Skin:  Intact without significant lesions or rashes.. Psych:  Alert and cooperative. Normal mood and affect.  Intake/Output from previous day: 05/20 0701 - 05/21 0700 In: 933.3 [P.O.:840; I.V.:93.3] Out: 5450 [Urine:5450] Intake/Output this shift: Total I/O In: 13 [I.V.:13] Out: 700 [Urine:700]  Lab Results:  Recent Labs  08/28/13 1220 08/29/13 0625 08/30/13 0450  WBC 9.1 8.1 7.2  HGB 8.5* 8.1* 7.8*  HCT 28.0* 27.1* 25.8*  PLT 312 230 269   BMET  Recent Labs  08/28/13 1220 08/29/13 0625 08/30/13 0900  NA 122* 124* 130*  K 5.2 4.9 4.4  CL 88* 90* 92*  CO2 23 22 27   GLUCOSE 169* 147* 109*  BUN 34* 32* 27*  CREATININE 1.59* 1.65* 1.56*  CALCIUM 8.2* 8.1* 8.3*     PT/INR  Recent Labs  08/29/13 0625 08/30/13 0450  LABPROT 20.7* 23.9*  INR 1.84* 2.22*     MPRESSION:  #36  73 year old female with a relatively new iron deficiency anemia, on oral iron for one to 2 months prior to admission. Hemoglobin overall has only varied 1.5 gm since admit. Drifting hemoglobin may be multifactorial  including multiple blood draws.  Negative EGD this admission  She's not had any overt evidence of bleeding and in fact has not had a bowel movement for several days. She is symptomatic with the anemia and with likely benefit from transfusions. #2 dilated cardiomyopathy with worsened EF now at 20-25% #3 status post NSTEMI this admission #4 atrial fibrillation on chronic Coumadin #5 morbid obesity #6 NASH #7 non-insulin-dependent diabetes mellitus #8 history of colon polyps, last colonoscopy -3-4 years ago  PLAN: #1 Would transfuse 1 unit of packed rbc's, #2 continue oral iron supplement #3 transfuse as needed for hemoglobin less than 8 #4 she is not appropriate at this time for any other endoscopic intervention because of an MI one week ago severe cardiomyopathy with EF 20-25%. #5 we will be happy to followup in the office and consider colonoscopy in 4-6 weeks if she is improved at that time.   Amy S Esterwood  08/30/2013, 11:56 AM  Attending physician's note   I have taken an interval history, reviewed the chart and examined the patient. I agree with the Advanced Practitioner's note, impression and recommendations. Multifactorial drift in Hb with no clear evidence of GI blood loss. Consider transfusions to keep Hb > 8 and will defer this decision to primary service. Colonoscopy does not appear to be indicated at this time and she is not appropriate for endoscopic intervention at this time with an MI one week ago and a severe cardiomyopathy with EF 20-25%. Dr. Ardis Hughs can followup in the office and reconsider colonoscopy in 6 weeks if she is improved at that time. GI signing off.    Pricilla Riffle. Fuller Plan, MD [facility]

## 2013-08-31 DIAGNOSIS — D509 Iron deficiency anemia, unspecified: Secondary | ICD-10-CM | POA: Diagnosis not present

## 2013-08-31 DIAGNOSIS — I5023 Acute on chronic systolic (congestive) heart failure: Secondary | ICD-10-CM | POA: Diagnosis not present

## 2013-08-31 DIAGNOSIS — I4891 Unspecified atrial fibrillation: Secondary | ICD-10-CM | POA: Diagnosis not present

## 2013-08-31 LAB — CBC WITH DIFFERENTIAL/PLATELET
BASOS PCT: 1 % (ref 0–1)
Basophils Absolute: 0.1 10*3/uL (ref 0.0–0.1)
Eosinophils Absolute: 0.4 10*3/uL (ref 0.0–0.7)
Eosinophils Relative: 5 % (ref 0–5)
HCT: 27 % — ABNORMAL LOW (ref 36.0–46.0)
HEMOGLOBIN: 8.2 g/dL — AB (ref 12.0–15.0)
LYMPHS PCT: 30 % (ref 12–46)
Lymphs Abs: 2.3 10*3/uL (ref 0.7–4.0)
MCH: 21.2 pg — ABNORMAL LOW (ref 26.0–34.0)
MCHC: 30.4 g/dL (ref 30.0–36.0)
MCV: 69.8 fL — ABNORMAL LOW (ref 78.0–100.0)
Monocytes Absolute: 0.9 10*3/uL (ref 0.1–1.0)
Monocytes Relative: 12 % (ref 3–12)
NEUTROS ABS: 3.9 10*3/uL (ref 1.7–7.7)
NEUTROS PCT: 52 % (ref 43–77)
Platelets: 299 10*3/uL (ref 150–400)
RBC: 3.87 MIL/uL (ref 3.87–5.11)
RDW: 19.8 % — ABNORMAL HIGH (ref 11.5–15.5)
WBC: 7.6 10*3/uL (ref 4.0–10.5)

## 2013-08-31 LAB — GLUCOSE, CAPILLARY
Glucose-Capillary: 78 mg/dL (ref 70–99)
Glucose-Capillary: 157 mg/dL — ABNORMAL HIGH (ref 70–99)
Glucose-Capillary: 177 mg/dL — ABNORMAL HIGH (ref 70–99)
Glucose-Capillary: 164 mg/dL — ABNORMAL HIGH (ref 70–99)

## 2013-08-31 LAB — BASIC METABOLIC PANEL
BUN: 25 mg/dL — AB (ref 6–23)
CHLORIDE: 88 meq/L — AB (ref 96–112)
CO2: 28 meq/L (ref 19–32)
Calcium: 8.6 mg/dL (ref 8.4–10.5)
Creatinine, Ser: 1.57 mg/dL — ABNORMAL HIGH (ref 0.50–1.10)
GFR calc non Af Amer: 32 mL/min — ABNORMAL LOW (ref >90)
GFR, EST AFRICAN AMERICAN: 37 mL/min — AB (ref >90)
Glucose, Bld: 82 mg/dL (ref 70–99)
Potassium: 3.9 mEq/L (ref 3.7–5.3)
Sodium: 129 mEq/L — ABNORMAL LOW (ref 137–147)

## 2013-08-31 LAB — CARBOXYHEMOGLOBIN
CARBOXYHEMOGLOBIN: 1.8 % — AB (ref 0.5–1.5)
Methemoglobin: 0.7 % (ref 0.0–1.5)
O2 Saturation: 67.4 %
TOTAL HEMOGLOBIN: 8.4 g/dL — AB (ref 12.0–16.0)

## 2013-08-31 LAB — PROTIME-INR
INR: 1.9 — ABNORMAL HIGH (ref 0.00–1.49)
PROTHROMBIN TIME: 21.2 s — AB (ref 11.6–15.2)

## 2013-08-31 MED ORDER — FUROSEMIDE 10 MG/ML IJ SOLN
80.0000 mg | Freq: Two times a day (BID) | INTRAMUSCULAR | Status: AC
  Administered 2013-08-31: 80 mg via INTRAVENOUS

## 2013-08-31 MED ORDER — SODIUM CHLORIDE 0.9 % IV SOLN
1020.0000 mg | Freq: Once | INTRAVENOUS | Status: AC
  Administered 2013-08-31: 1020 mg via INTRAVENOUS
  Filled 2013-08-31 (×2): qty 34

## 2013-08-31 MED ORDER — APIXABAN 5 MG PO TABS
5.0000 mg | ORAL_TABLET | Freq: Two times a day (BID) | ORAL | Status: DC
  Administered 2013-08-31 – 2013-09-05 (×11): 5 mg via ORAL
  Filled 2013-08-31 (×13): qty 1

## 2013-08-31 NOTE — Progress Notes (Signed)
Advanced Heart Failure Rounding Note   Subjective:    [name] with PMH significant for chronic low back pain, chronic anemia, IDDM-Type 2, chronic Afib on warfarin, and supposed nonischemic dilated cardiomyopathy/HF (EF 20-25% by echo 5/15 previous EF 40-45% in 2014). She was transferred from [facility] to [facility] due to HF acute decompensated HF and possible NSTEMI. Peak troponin 6.7 Had LHC 08/24/13 with moderate diffuse mid LAD disease of 40-60% and Septal perforator branch with ostial ~90%.   GI evaluated due to microcytic anemia. Had EGD with no evidence of bleeding or pathology. Coumadin was restarted. Seen again on 5/21 by Dr. Fuller Plan due falling Hgb. Suggested transfusion RBCs. Not felt to be appropriate for colonoscopy due to recent NSTEMI and low EF. (despite fact that coronaries with nonobs CAD)  Did not respond well to IV lasix initially. Milrinone added. Now diuresing well. Co-ox 68% She has been diuresing with IV lasix with poor response and worsening renal function. Weight up 20 pounds since admit. Overall weight down  20 pounds. Mild dyspnea with exertion. Feels much better. CVP 6  ECHO 08/23/13 EF 20-25% RV normal.  Creatinine 1.5>1.6>1.5  INR 1.8 >2.2>1.9  Objective:   Weight Range:  Vital Signs:   Temp:  [97.4 F (36.3 C)-98 F (36.7 C)] 98 F (36.7 C) (05/22 0400) Pulse Rate:  [33-90] 81 (05/22 0600) Resp:  [14-22] 18 (05/22 0600) BP: (103-159)/(34-80) 147/47 mmHg (05/22 0600) SpO2:  [92 %-99 %] 92 % (05/22 0600) Weight:  [256 lb 9.9 oz (116.4 kg)] 256 lb 9.9 oz (116.4 kg) (05/22 0441) Last BM Date: 08/28/13  Weight change: Filed Weights   08/29/13 0539 08/30/13 0400 08/31/13 0441  Weight: 276 lb 0.3 oz (125.2 kg) 271 lb 2.7 oz (123 kg) 256 lb 9.9 oz (116.4 kg)    Intake/Output:   Intake/Output Summary (Last 24 hours) at 08/31/13 0818 Last data filed at 08/31/13 0700  Gross per 24 hour  Intake  346.9 ml  Output   8500 ml  Net -8153.1 ml     Physical  Exam: CVP 8 General:  No resp difficulty. Sitting on the side of the bed.   HEENT: normal Neck: supple. JVP 8 Carotids 2+ bilat; no bruits. No lymphadenopathy or thryomegaly appreciated. Cor: PMI nondisplaced. Regular rate & rhythm. No rubs, gallops or murmurs. Lungs: clear Abdomen: soft, nontender, nondistended. No hepatosplenomegaly. No bruits or masses. Good bowel sounds. Extremities: no cyanosis, clubbing, rash, R and LLE 1-2+  Edema. Bilateral SCDs Neuro: alert & orientedx3, cranial nerves grossly intact. moves all 4 extremities w/o difficulty. Affect pleasant  Telemetry: Afib   Labs: Basic Metabolic Panel:  Recent Labs Lab 08/26/13 0525 08/27/13 0524 08/28/13 0540 08/28/13 1220 08/29/13 0625 08/30/13 0450 08/30/13 0900 08/31/13 0456  NA 128* 129*  --  122* 124*  --  130* 129*  K 4.4 4.2  --  5.2 4.9  --  4.4 3.9  CL 90* 92*  --  88* 90*  --  92* 88*  CO2 28 25  --  23 22  --  27 28  GLUCOSE 186* 169*  --  169* 147*  --  109* 82  BUN 27* 26*  --  34* 32*  --  27* 25*  CREATININE 1.52* 1.39*  --  1.59* 1.65*  --  1.56* 1.57*  CALCIUM 8.5 7.5*  --  8.2* 8.1*  --  8.3* 8.6  MG 2.3 2.6* 2.9*  --  2.9* 2.5  --   --  Liver Function Tests: No results found for this basename: AST, ALT, ALKPHOS, BILITOT, PROT, ALBUMIN,  in the last 168 hours No results found for this basename: LIPASE, AMYLASE,  in the last 168 hours No results found for this basename: AMMONIA,  in the last 168 hours  CBC:  Recent Labs Lab 08/27/13 0524  08/28/13 0540 08/28/13 1220 08/29/13 0625 08/30/13 0450 08/31/13 0456  WBC 8.5  --  9.4 9.1 8.1 7.2 7.6  NEUTROABS 5.1  --  5.6  --  5.1 4.0 3.9  HGB 7.4*  < > 8.2* 8.5* 8.1* 7.8* 8.2*  HCT 24.7*  < > 27.6* 28.0* 27.1* 25.8* 27.0*  MCV 70.2*  --  70.4* 69.5* 69.7* 70.1* 69.8*  PLT 273  --  302 312 230 269 299  < > = values in this interval not displayed.  Cardiac Enzymes: No results found for this basename: CKTOTAL, CKMB, CKMBINDEX,  TROPONINI,  in the last 168 hours  BNP: BNP (last 3 results)  Recent Labs  08/22/13 0406  PROBNP 1368.0*     Other results:    Imaging: Dg Chest Port 1 View  08/29/2013   CLINICAL DATA:  Chest pain and shortness of breath.  EXAM: PORTABLE CHEST - 1 VIEW  COMPARISON:  DG CHEST 1V PORT dated 08/26/2013  FINDINGS: The patient has undergone interval placement of a PICC line via the right upper extremity. The tip of the catheter lies in the midportion of the SVC. The cardiopericardial silhouette is enlarged and the pulmonary vascularity is engorged.  IMPRESSION: Mild CHF. No postprocedure complication following PICC line placement.   Electronically Signed   By: David  Martinique   On: 08/29/2013 16:25     Medications:     Scheduled Medications: . aspirin EC  81 mg Oral Daily  . carvedilol  12.5 mg Oral BID WC  . ferrous sulfate  325 mg Oral Q breakfast  . furosemide  80 mg Intravenous BID  . insulin aspart  0-15 Units Subcutaneous TID WC  . insulin aspart  0-5 Units Subcutaneous QHS  . insulin glargine  20 Units Subcutaneous Daily  . insulin glargine  50 Units Subcutaneous QHS  . lisinopril  10 mg Oral Daily  . metolazone  2.5 mg Oral Daily  . pantoprazole  40 mg Oral Daily  . rosuvastatin  20 mg Oral q1800  . sodium chloride  10-40 mL Intracatheter Q12H  . sodium chloride  3 mL Intravenous Q12H  . venlafaxine  150 mg Oral BID WC  . Warfarin - Pharmacist Dosing Inpatient   Does not apply q1800    Infusions: . milrinone 0.25 mcg/kg/min (08/31/13 0758)    PRN Medications: sodium chloride, bisacodyl, HYDROcodone-acetaminophen, sodium chloride, sodium chloride   Assessment/Plan    1. NSTEMI  - peak trop 6.7, cath with moderate diffuse disease of LAD 40-60%, septal perforator with 90% lesion. Overall no obvious culprit lesion. Possible demand ischemia in the setting of volume overload. Medical management.  - 08/2013 echo LVEF 20-25%, diffuse hypokinesis.  2. A/C systolic  heart failure. EF 20-25%. Volume status improving. CVP 8 Brisk diuresis over night. Overall weight down 20 pounds.  Continue Milrinone 0.25 mcg. Start to wean tomorrow if volume status continues to trend down.  Continue IV lasix 80 mg bid and continue metolazone 2.5 mg daily today. (Was on lasix 40 mg twice a day prior to admit)   Renal function stable.  Continue reduced dose of carvedilol @ 12.5 mg twice a day. Continue  lisinopril 10 mg daily.  Cardiac Rehab following.  3. Microcytic anemia : MCV 70- followed with help of GI, EGD without significant pathology. Multiple colonoscopies per GI notes.  Hgb continues to trend down. 8.5>8.1>7.8>8.2.  Watch closely. She has been back on coumadin with no obvious blood in stool per pt. GI would like outpatient follow up.    4. Afib: Chronic.  - rate controlled with beta blocker, initially coumadin held. Has been resumed after GI workup, no contraindication from their standpoint per notes. INR 1.9.  Pharmacy following. Consider switch to NOAC 5. Hyponatremia --> 129  6. CKD stage 3 --stable  7.  DM- On insulin- CBG controlled. Had low glucose this am 77. Will watch.   Set up [facility] for RN .   Length of Stay: [place] NP-C  08/31/2013, 8:18 AM Advanced Heart Failure Team Pager [phone] (M-F; 7a - 4p)  Please contact [facility] for night-coverage after hours (4p -7a ) and weekends on [email or web address]  Patient seen and examined with Darrick Grinder, NP. We discussed all aspects of the encounter. I agree with the assessment and plan as stated above.   Volume status much improved. Will continue IV lasix today (stop metolazone) likely switch to po diuretics tomorrow. (on lasix 40 po bid at home). Will continue milrinone and try to wean tomorrow. Treat with IV iron (Ferra-heme). Given GIB issues will switch coumadin to Eliquis. Case manager consult to help with assistance plan.   Shaune Pascal Cherye Gaertner,MD 11:42 AM

## 2013-08-31 NOTE — Progress Notes (Signed)
Pt was up and had a bowel movement today after receiving a suppository laxative per md orders. Pt had daughter and family in room visiting most of day off and on. Pt was up in chair for several hrs today. Pt states she feels better today. Pt has been voiding large amount of clear yellow urine in bedside commode. Pt's daughter assisted pt bath today.

## 2013-08-31 NOTE — Progress Notes (Signed)
Physical Therapy Treatment Patient Details Name: Victoria Adams MRN: [id number] DOB: 12/09/1940 Today's Date: 08/31/2013    History of Present Illness 73 yo female with PMH significant for chronic low back pain, chronic anemia, IDDM-Type 2, chronic Afib on warfarin, and supposed nonischemic dilated cardiomyopathy/HF (last EF 40-45% in 2014) who was admitted 05/13 for likely acute on chronic HF decompensation as well as NSTEMI vs mild troponin elevation related to CHF exacerbation.  Low hgb througout stay; on 5/17, Hgb 8.5    PT Comments    Pt progressing towards physical therapy goals. Pt on RA throughout session, with drop in O2 sats at very beginning of gait training (to 85%), however able to be improved with pursed-lip breathing, and sats remained between 94-97% for the rest of session. Will continue to progress per POC.   Follow Up Recommendations  Home health PT;Supervision/Assistance - 24 hour     Equipment Recommendations  Rolling walker with 5" wheels;3in1 (PT)    Recommendations for Other Services       Precautions / Restrictions Precautions Precautions: Fall Restrictions Weight Bearing Restrictions: No    Mobility  Bed Mobility               General bed mobility comments: Pt sitting EOB when PT arrived.  Transfers Overall transfer level: Needs assistance Equipment used: Rolling walker (2 wheeled) Transfers: Sit to/from Stand Sit to Stand: Min guard         General transfer comment: Increased time required to achieve full standing, with VC's for hand placement on seated surface for safety.   Ambulation/Gait Ambulation/Gait assistance: Min assist Ambulation Distance (Feet): 100 Feet Assistive device: Rolling walker (2 wheeled) Gait Pattern/deviations: Step-through pattern;Decreased stride length;Leaning posteriorly Gait velocity: Decreased Gait velocity interpretation: Below normal speed for age/gender General Gait Details: Occasional min assist  required for unsteadiness as pt demonstrates occasional posterior lean. VC's for pursed-lip breathing. Pt requiring 2 standing rest breaks throughout gait training.    Stairs            Wheelchair Mobility    Modified Rankin (Stroke Patients Only)       Balance Overall balance assessment: Needs assistance Sitting-balance support: Feet supported;No upper extremity supported Sitting balance-Leahy Scale: Fair     Standing balance support: Bilateral upper extremity supported Standing balance-Leahy Scale: Poor Standing balance comment: Pt required UE support to maintain balance during session.                     Cognition Arousal/Alertness: Awake/alert Behavior During Therapy: WFL for tasks assessed/performed Overall Cognitive Status: Within Functional Limits for tasks assessed                      Exercises      General Comments        Pertinent Vitals/Pain See PT Comments regarding O2 sats. Other vitals stable throughout session.     Home Living                      Prior Function            PT Goals (current goals can now be found in the care plan section) Acute Rehab PT Goals Patient Stated Goal: get better PT Goal Formulation: With patient Time For Goal Achievement: 09/02/13 Potential to Achieve Goals: Good Progress towards PT goals: Progressing toward goals    Frequency  Min 3X/week    PT Plan Current plan remains appropriate  Co-evaluation             End of Session Equipment Utilized During Treatment: Gait belt Activity Tolerance: Patient limited by fatigue Patient left: in chair;with call bell/phone within reach;with family/visitor present;with nursing/sitter in room     Time: 0109-3235 PT Time Calculation (min): 27 min  Charges:  $Gait Training: 8-22 mins $Therapeutic Activity: 8-22 mins                    G Codes:      Jolyn Lent 2013-09-02, 1:39 PM  Jolyn Lent, PT, DPT Acute  Rehabilitation Services Pager: [phone]

## 2013-09-01 DIAGNOSIS — D649 Anemia, unspecified: Secondary | ICD-10-CM | POA: Diagnosis not present

## 2013-09-01 DIAGNOSIS — I4891 Unspecified atrial fibrillation: Secondary | ICD-10-CM | POA: Diagnosis not present

## 2013-09-01 DIAGNOSIS — I5023 Acute on chronic systolic (congestive) heart failure: Secondary | ICD-10-CM | POA: Diagnosis not present

## 2013-09-01 LAB — BASIC METABOLIC PANEL
BUN: 22 mg/dL (ref 6–23)
CHLORIDE: 91 meq/L — AB (ref 96–112)
CO2: 31 mEq/L (ref 19–32)
Calcium: 9.2 mg/dL (ref 8.4–10.5)
Creatinine, Ser: 1.57 mg/dL — ABNORMAL HIGH (ref 0.50–1.10)
GFR calc Af Amer: 37 mL/min — ABNORMAL LOW (ref >90)
GFR calc non Af Amer: 32 mL/min — ABNORMAL LOW (ref >90)
GLUCOSE: 73 mg/dL (ref 70–99)
POTASSIUM: 3.7 meq/L (ref 3.7–5.3)
Sodium: 134 mEq/L — ABNORMAL LOW (ref 137–147)

## 2013-09-01 LAB — GLUCOSE, CAPILLARY
Glucose-Capillary: 75 mg/dL (ref 70–99)
Glucose-Capillary: 113 mg/dL — ABNORMAL HIGH (ref 70–99)
Glucose-Capillary: 131 mg/dL — ABNORMAL HIGH (ref 70–99)
Glucose-Capillary: 156 mg/dL — ABNORMAL HIGH (ref 70–99)

## 2013-09-01 LAB — CBC WITH DIFFERENTIAL/PLATELET
BASOS PCT: 1 % (ref 0–1)
Basophils Absolute: 0.1 10*3/uL (ref 0.0–0.1)
Eosinophils Absolute: 0.4 10*3/uL (ref 0.0–0.7)
Eosinophils Relative: 5 % (ref 0–5)
HCT: 28.3 % — ABNORMAL LOW (ref 36.0–46.0)
HEMOGLOBIN: 8.8 g/dL — AB (ref 12.0–15.0)
LYMPHS ABS: 2.6 10*3/uL (ref 0.7–4.0)
Lymphocytes Relative: 30 % (ref 12–46)
MCH: 21.6 pg — AB (ref 26.0–34.0)
MCHC: 31.1 g/dL (ref 30.0–36.0)
MCV: 69.4 fL — AB (ref 78.0–100.0)
MONO ABS: 1.2 10*3/uL — AB (ref 0.1–1.0)
Monocytes Relative: 14 % — ABNORMAL HIGH (ref 3–12)
Neutro Abs: 4.3 10*3/uL (ref 1.7–7.7)
Neutrophils Relative %: 50 % (ref 43–77)
Platelets: 307 10*3/uL (ref 150–400)
RBC: 4.08 MIL/uL (ref 3.87–5.11)
RDW: 19.6 % — ABNORMAL HIGH (ref 11.5–15.5)
WBC: 8.6 10*3/uL (ref 4.0–10.5)

## 2013-09-01 LAB — PROTIME-INR
INR: 3.2 — ABNORMAL HIGH (ref 0.00–1.49)
Prothrombin Time: 31.6 seconds — ABNORMAL HIGH (ref 11.6–15.2)

## 2013-09-01 LAB — CARBOXYHEMOGLOBIN
CARBOXYHEMOGLOBIN: 1.9 % — AB (ref 0.5–1.5)
Methemoglobin: 1.4 % (ref 0.0–1.5)
O2 SAT: 58.7 %
Total hemoglobin: 9 g/dL — ABNORMAL LOW (ref 12.0–16.0)

## 2013-09-01 MED ORDER — FUROSEMIDE 80 MG PO TABS
80.0000 mg | ORAL_TABLET | Freq: Two times a day (BID) | ORAL | Status: DC
  Administered 2013-09-01 – 2013-09-05 (×8): 80 mg via ORAL
  Filled 2013-09-01 (×10): qty 1

## 2013-09-01 MED ORDER — MILRINONE IN DEXTROSE 20 MG/100ML IV SOLN
0.2500 ug/kg/min | INTRAVENOUS | Status: DC
  Administered 2013-09-01: 0.125 ug/kg/min via INTRAVENOUS
  Administered 2013-09-03: 0.0625 ug/kg/min via INTRAVENOUS
  Filled 2013-09-01 (×3): qty 100

## 2013-09-01 MED ORDER — FUROSEMIDE 10 MG/ML IJ SOLN
80.0000 mg | Freq: Once | INTRAMUSCULAR | Status: AC
  Administered 2013-09-01: 80 mg via INTRAVENOUS
  Filled 2013-09-01: qty 8

## 2013-09-01 MED ORDER — POTASSIUM CHLORIDE CRYS ER 20 MEQ PO TBCR
40.0000 meq | EXTENDED_RELEASE_TABLET | Freq: Once | ORAL | Status: AC
  Administered 2013-09-01: 40 meq via ORAL
  Filled 2013-09-01: qty 2

## 2013-09-01 NOTE — Progress Notes (Signed)
CARDIAC REHAB PHASE I   PRE:  Rate/Rhythm: 73 afib  BP:  Supine: 129/57  Sitting:   Standing:    SaO2: 95%RA  MODE:  Ambulation: 90 ft   POST:  Rate/Rhythm: 109 afib  BP:  Supine:   Sitting: 134/55  Standing:    SaO2: 95%RA 1251-1314 Pt stated tired from bath and bathroom trip but willing to take short walk. Walked 90 ft on RA with rolling walker and asst x2. To bed after walk. Some SOB after walk but recovered quickly.   Graylon Good, RN BSN  09/01/2013 1:08 PM

## 2013-09-01 NOTE — Progress Notes (Signed)
Advanced Heart Failure Rounding Note   Subjective:    [name] with PMH significant for chronic low back pain, chronic anemia, IDDM-Type 2, chronic Afib on warfarin, and supposed nonischemic dilated cardiomyopathy/HF (EF 20-25% by echo 5/15 previous EF 40-45% in 2014). She was transferred from [facility] to [facility] due to HF acute decompensated HF and possible NSTEMI. Peak troponin 6.7 Had LHC 08/24/13 with moderate diffuse mid LAD disease of 40-60% and Septal perforator branch with ostial ~90%.   GI evaluated due to microcytic anemia. Had EGD with no evidence of bleeding or pathology. Coumadin was restarted. Seen again on 5/21 by Dr. Fuller Plan due falling Hgb. Suggested transfusion RBCs. Not felt to be appropriate for colonoscopy due to recent NSTEMI and low EF. (despite fact that coronaries with nonobs CAD)  Did not respond well to IV lasix initially. Milrinone added. Now diuresing well. Weight down another 10 pounds overnight. 30 pounds total. Feels much better. Co-ox 59%on milrinone 0.25. Rec'd IV iron yesterday. HGB stable. Switched to [employer]. (CM is helping with this)    ECHO 08/23/13 EF 20-25% RV normal.  Creatinine 1.5>1.6>1.5>1.6   Objective:   Weight Range:  Vital Signs:   Temp:  [97.2 F (36.2 C)-97.9 F (36.6 C)] 97.2 F (36.2 C) (05/23 0758) Pulse Rate:  [61-86] 86 (05/23 0830) Resp:  [15-28] 15 (05/23 0758) BP: (118-149)/(54-82) 139/54 mmHg (05/23 0830) SpO2:  [90 %-96 %] 96 % (05/23 0758) Weight:  [111.2 kg (245 lb 2.4 oz)] 111.2 kg (245 lb 2.4 oz) (05/23 0500) Last BM Date: 08/31/13  Weight change: Filed Weights   08/30/13 0400 08/31/13 0441 09/01/13 0500  Weight: 123 kg (271 lb 2.7 oz) 116.4 kg (256 lb 9.9 oz) 111.2 kg (245 lb 2.4 oz)    Intake/Output:   Intake/Output Summary (Last 24 hours) at 09/01/13 0841 Last data filed at 09/01/13 0700  Gross per 24 hour  Intake  926.9 ml  Output   7051 ml  Net -6124.1 ml     Physical Exam: CVP 7-8 General:  No resp  difficulty. Sitting on the side of the bed.   HEENT: normal Neck: supple. JVP 8 Carotids 2+ bilat; no bruits. No lymphadenopathy or thryomegaly appreciated. Cor: PMI nondisplaced. Irregular rate & rhythm. No rubs, gallops or murmurs. Lungs: clear Abdomen: soft, nontender, nondistended. No hepatosplenomegaly. No bruits or masses. Good bowel sounds. Extremities: no cyanosis, clubbing, rash, R and LLE trace  Edema. Bilateral SCDs Neuro: alert & orientedx3, cranial nerves grossly intact. moves all 4 extremities w/o difficulty. Affect pleasant  Telemetry: Afib 90  Labs: Basic Metabolic Panel:  Recent Labs Lab 08/26/13 0525 08/27/13 0524 08/28/13 0540 08/28/13 1220 08/29/13 0625 08/30/13 0450 08/30/13 0900 08/31/13 0456 09/01/13 0550  NA 128* 129*  --  122* 124*  --  130* 129* 134*  K 4.4 4.2  --  5.2 4.9  --  4.4 3.9 3.7  CL 90* 92*  --  88* 90*  --  92* 88* 91*  CO2 28 25  --  23 22  --  27 28 31   GLUCOSE 186* 169*  --  169* 147*  --  109* 82 73  BUN 27* 26*  --  34* 32*  --  27* 25* 22  CREATININE 1.52* 1.39*  --  1.59* 1.65*  --  1.56* 1.57* 1.57*  CALCIUM 8.5 7.5*  --  8.2* 8.1*  --  8.3* 8.6 9.2  MG 2.3 2.6* 2.9*  --  2.9* 2.5  --   --   --  Liver Function Tests: No results found for this basename: AST, ALT, ALKPHOS, BILITOT, PROT, ALBUMIN,  in the last 168 hours No results found for this basename: LIPASE, AMYLASE,  in the last 168 hours No results found for this basename: AMMONIA,  in the last 168 hours  CBC:  Recent Labs Lab 08/28/13 0540 08/28/13 1220 08/29/13 0625 08/30/13 0450 08/31/13 0456 09/01/13 0550  WBC 9.4 9.1 8.1 7.2 7.6 8.6  NEUTROABS 5.6  --  5.1 4.0 3.9 4.3  HGB 8.2* 8.5* 8.1* 7.8* 8.2* 8.8*  HCT 27.6* 28.0* 27.1* 25.8* 27.0* 28.3*  MCV 70.4* 69.5* 69.7* 70.1* 69.8* 69.4*  PLT 302 312 230 269 299 307    Cardiac Enzymes: No results found for this basename: CKTOTAL, CKMB, CKMBINDEX, TROPONINI,  in the last 168 hours  BNP: BNP (last 3  results)  Recent Labs  08/22/13 0406  PROBNP 1368.0*     Other results:    Imaging: No results found.   Medications:     Scheduled Medications: . apixaban  5 mg Oral BID  . aspirin EC  81 mg Oral Daily  . carvedilol  12.5 mg Oral BID WC  . ferrous sulfate  325 mg Oral Q breakfast  . insulin aspart  0-15 Units Subcutaneous TID WC  . insulin aspart  0-5 Units Subcutaneous QHS  . insulin glargine  20 Units Subcutaneous Daily  . insulin glargine  50 Units Subcutaneous QHS  . lisinopril  10 mg Oral Daily  . pantoprazole  40 mg Oral Daily  . rosuvastatin  20 mg Oral q1800  . sodium chloride  10-40 mL Intracatheter Q12H  . sodium chloride  3 mL Intravenous Q12H  . venlafaxine  150 mg Oral BID WC    Infusions: . milrinone 0.25 mcg/kg/min (08/31/13 2106)    PRN Medications: sodium chloride, bisacodyl, HYDROcodone-acetaminophen, sodium chloride, sodium chloride   Assessment/Plan    1. NSTEMI  - peak trop 6.7, cath with moderate diffuse disease of LAD 40-60%, septal perforator with 90% lesion. Overall no obvious culprit lesion. Possible demand ischemia in the setting of volume overload. Medical management.  - 08/2013 echo LVEF 20-25%, diffuse hypokinesis.  2. A/C systolic heart failure. EF 20-25%.  3. Microcytic anemia : 4. Afib: Chronic.  5. Hyponatremia/hypokalemia 6. CKD stage 3 -  7.  DM- On insulin  Volume status much improved. Will give one more dose IV lasix this am and then switch to 80 po starting tonight.(on lasix 40 po bid at home).  Begin to wean milrinone. Hgb stable. IV iron given. Warfarin switched to Eliquis. Continue PT. [place] home early next week with HHRN/PT if co-ox remains stable off milrinone.   Shaune Pascal Amritpal Shropshire,MD 8:41 AM

## 2013-09-01 NOTE — Progress Notes (Signed)
ANTICOAGULATION CONSULT NOTE - Follow Up Consult  Pharmacy Consult for apixaban Indication: atrial fibrillation  Allergies  Allergen Reactions  . Codeine Nausea And Vomiting  . Tylox [Oxycodone-Acetaminophen]     Vision closing in & heart palpitations.     Patient Measurements: Height: 5\' 7"  (170.2 cm) Weight: 245 lb 2.4 oz (111.2 kg) (standing scale) IBW/kg (Calculated) : 61.6  Vital Signs: Temp: 97.8 F (36.6 C) (05/23 1444) Temp src: Oral (05/23 1444) BP: 118/47 mmHg (05/23 1444) Pulse Rate: 77 (05/23 1444)  Labs:  Recent Labs  08/30/13 0450 08/30/13 0900 08/31/13 0456 09/01/13 0550  HGB 7.8*  --  8.2* 8.8*  HCT 25.8*  --  27.0* 28.3*  PLT 269  --  299 307  LABPROT 23.9*  --  21.2* 31.6*  INR 2.22*  --  1.90* 3.20*  CREATININE  --  1.56* 1.57* 1.57*    Estimated Creatinine Clearance: 42.2 ml/min (by C-G formula based on Cr of 1.57).   Medications:  Scheduled:  . apixaban  5 mg Oral BID  . carvedilol  12.5 mg Oral BID WC  . ferrous sulfate  325 mg Oral Q breakfast  . furosemide  80 mg Oral BID  . insulin aspart  0-15 Units Subcutaneous TID WC  . insulin aspart  0-5 Units Subcutaneous QHS  . insulin glargine  20 Units Subcutaneous Daily  . insulin glargine  50 Units Subcutaneous QHS  . lisinopril  10 mg Oral Daily  . pantoprazole  40 mg Oral Daily  . rosuvastatin  20 mg Oral q1800  . sodium chloride  10-40 mL Intracatheter Q12H  . sodium chloride  3 mL Intravenous Q12H  . venlafaxine  150 mg Oral BID WC   Infusions:  . milrinone 0.125 mcg/kg/min (09/01/13 4098)    Assessment: 73 yo F with CHF transferred from [facility] to [facility] on 5/13 for cardiology evaluation in the setting of elevated troponins and CP, now s/p cath and decision to medically manage.   Initially restarted on warfarin but transitioned to Eliquis d/t decreased risk of bleeding. Of note, patient did have +FOB this admission, but EGD resulted normal (can continue with anticoagulation per  GI).   H/H is low but now trending up.  No bleeding issues noted beyond above concerns.   Goal of Therapy:  INR 2-3 Monitor platelets by anticoagulation protocol: Yes   Plan:  - Continue apixaban 5 mg PO BID - Monitor renal function, CBC, s/s bleeding  Harolyn Rutherford, PharmD Clinical Pharmacist - Resident Pager: [phone] Pharmacy: [phone] 09/01/2013 4:06 PM

## 2013-09-02 DIAGNOSIS — I5023 Acute on chronic systolic (congestive) heart failure: Secondary | ICD-10-CM | POA: Diagnosis not present

## 2013-09-02 LAB — BASIC METABOLIC PANEL
BUN: 19 mg/dL (ref 6–23)
CO2: 31 mEq/L (ref 19–32)
CREATININE: 1.52 mg/dL — AB (ref 0.50–1.10)
Calcium: 9.3 mg/dL (ref 8.4–10.5)
Chloride: 92 mEq/L — ABNORMAL LOW (ref 96–112)
GFR calc Af Amer: 39 mL/min — ABNORMAL LOW (ref >90)
GFR calc non Af Amer: 33 mL/min — ABNORMAL LOW (ref >90)
Glucose, Bld: 57 mg/dL — ABNORMAL LOW (ref 70–99)
Potassium: 3.6 mEq/L — ABNORMAL LOW (ref 3.7–5.3)
Sodium: 135 mEq/L — ABNORMAL LOW (ref 137–147)

## 2013-09-02 LAB — CARBOXYHEMOGLOBIN
Carboxyhemoglobin: 2 % — ABNORMAL HIGH (ref 0.5–1.5)
Carboxyhemoglobin: 1.4 % (ref 0.5–1.5)
Methemoglobin: 1.1 % (ref 0.0–1.5)
Methemoglobin: 1 % (ref 0.0–1.5)
O2 Saturation: 91.2 %
O2 Saturation: 55.7 %
TOTAL HEMOGLOBIN: 9.5 g/dL — AB (ref 12.0–16.0)
Total hemoglobin: 9.5 g/dL — ABNORMAL LOW (ref 12.0–16.0)

## 2013-09-02 LAB — CBC WITH DIFFERENTIAL/PLATELET
Basophils Absolute: 0.1 10*3/uL (ref 0.0–0.1)
Basophils Relative: 1 % (ref 0–1)
EOS ABS: 0.5 10*3/uL (ref 0.0–0.7)
EOS PCT: 6 % — AB (ref 0–5)
HCT: 29.8 % — ABNORMAL LOW (ref 36.0–46.0)
HEMOGLOBIN: 9.3 g/dL — AB (ref 12.0–15.0)
LYMPHS ABS: 3.4 10*3/uL (ref 0.7–4.0)
Lymphocytes Relative: 37 % (ref 12–46)
MCH: 21.8 pg — AB (ref 26.0–34.0)
MCHC: 31.2 g/dL (ref 30.0–36.0)
MCV: 70 fL — AB (ref 78.0–100.0)
MONO ABS: 1.1 10*3/uL — AB (ref 0.1–1.0)
Monocytes Relative: 12 % (ref 3–12)
Neutro Abs: 4.1 10*3/uL (ref 1.7–7.7)
Neutrophils Relative %: 44 % (ref 43–77)
Platelets: 347 10*3/uL (ref 150–400)
RBC: 4.26 MIL/uL (ref 3.87–5.11)
RDW: 20.2 % — ABNORMAL HIGH (ref 11.5–15.5)
WBC: 9.2 10*3/uL (ref 4.0–10.5)

## 2013-09-02 LAB — GLUCOSE, CAPILLARY
Glucose-Capillary: 85 mg/dL (ref 70–99)
Glucose-Capillary: 80 mg/dL (ref 70–99)
Glucose-Capillary: 156 mg/dL — ABNORMAL HIGH (ref 70–99)
Glucose-Capillary: 143 mg/dL — ABNORMAL HIGH (ref 70–99)

## 2013-09-02 LAB — PROTIME-INR
INR: 2.43 — ABNORMAL HIGH (ref 0.00–1.49)
Prothrombin Time: 25.6 seconds — ABNORMAL HIGH (ref 11.6–15.2)

## 2013-09-02 NOTE — Progress Notes (Signed)
Patient ID: Victoria Adams, female   DOB: 1941/03/06, 73 y.o.   MRN: [id number]   Subjective:   Feels well this am  Less dyspnic slept well    [name] with PMH significant for chronic low back pain, chronic anemia, IDDM-Type 2, chronic Afib on warfarin, and supposed nonischemic dilated cardiomyopathy/HF (EF 20-25% by echo 5/15 previous EF 40-45% in 2014). She was transferred from [facility] due to HF acute decompensated HF and possible NSTEMI. Peak troponin 6.7 Had LHC 08/24/13 with moderate diffuse mid LAD disease of 40-60% and Septal perforator branch with ostial ~90%.   GI evaluated due to microcytic anemia. Had EGD with no evidence of bleeding or pathology. Coumadin was restarted. Seen again on 5/21 by Victoria Adams due falling Hgb. Suggested transfusion RBCs. Not felt to be appropriate for colonoscopy due to recent NSTEMI and low EF. (despite fact that coronaries with nonobs CAD)  Did not respond well to IV lasix initially. Milrinone added. Now diuresing well. Weight down another 10 pounds overnight. 30 pounds total. Feels much better. Co-ox 59%on milrinone 0.25. Rec'd IV iron yesterday. HGB stable. Switched to Victoria Adams. (CM is helping with this)    ECHO 08/23/13 EF 20-25% RV normal.  Creatinine 1.5>1.6>1.5>1.6   Objective:   Weight Range:  Vital Signs:   Temp:  [97.2 F (36.2 C)-98.2 F (36.8 C)] 98 F (36.7 C) (05/24 0441) Pulse Rate:  [71-96] 76 (05/24 0622) Resp:  [13-21] 17 (05/24 0622) BP: (118-143)/(43-82) 122/43 mmHg (05/24 0441) SpO2:  [93 %-99 %] 97 % (05/24 0622) Weight:  [247 lb 9.2 oz (112.3 kg)] 247 lb 9.2 oz (112.3 kg) (05/24 0605) Last BM Date: 09/01/13  Weight change: Filed Weights   08/31/13 0441 09/01/13 0500 09/02/13 0605  Weight: 256 lb 9.9 oz (116.4 kg) 245 lb 2.4 oz (111.2 kg) 247 lb 9.2 oz (112.3 kg)    Intake/Output:   Intake/Output Summary (Last 24 hours) at 09/02/13 0754 Last data filed at 09/02/13 0200  Gross per 24 hour  Intake 1459.85  ml  Output    950 ml  Net 509.85 ml     Physical Exam: CVP 7-8  PIC LIne RUE  General:  No resp difficulty. Sitting on the side of the bed.   HEENT: normal Neck: supple. JVP 8 Carotids 2+ bilat; no bruits. No lymphadenopathy or thryomegaly appreciated. Cor: PMI nondisplaced. Irregular rate & rhythm. No rubs, gallops or murmurs. Lungs: clear Abdomen: soft, nontender, nondistended. No hepatosplenomegaly. No bruits or masses. Good bowel sounds. Extremities: no cyanosis, clubbing, rash, R and LLE trace  Edema. Bilateral SCDs Neuro: alert & orientedx3, cranial nerves grossly intact. moves all 4 extremities w/o difficulty. Affect pleasant  Telemetry: Afib 90  Labs: Basic Metabolic Panel:  Recent Labs Lab 08/27/13 0524 08/28/13 0540  08/29/13 0625 08/30/13 0450 08/30/13 0900 08/31/13 0456 09/01/13 0550 09/02/13 0500  NA 129*  --   < > 124*  --  130* 129* 134* 135*  K 4.2  --   < > 4.9  --  4.4 3.9 3.7 3.6*  CL 92*  --   < > 90*  --  92* 88* 91* 92*  CO2 25  --   < > 22  --  27 28 31 31   GLUCOSE 169*  --   < > 147*  --  109* 82 73 57*  BUN 26*  --   < > 32*  --  27* 25* 22 19  CREATININE 1.39*  --   < >  1.65*  --  1.56* 1.57* 1.57* 1.52*  CALCIUM 7.5*  --   < > 8.1*  --  8.3* 8.6 9.2 9.3  MG 2.6* 2.9*  --  2.9* 2.5  --   --   --   --   < > = values in this interval not displayed.   CBC:  Recent Labs Lab 08/29/13 0625 08/30/13 0450 08/31/13 0456 09/01/13 0550 09/02/13 0500  WBC 8.1 7.2 7.6 8.6 9.2  NEUTROABS 5.1 4.0 3.9 4.3 4.1  HGB 8.1* 7.8* 8.2* 8.8* 9.3*  HCT 27.1* 25.8* 27.0* 28.3* 29.8*  MCV 69.7* 70.1* 69.8* 69.4* 70.0*  PLT 230 269 299 307 347     BNP: BNP (last 3 results)  Recent Labs  08/22/13 0406  PROBNP 1368.0*       Medications:     Scheduled Medications: . apixaban  5 mg Oral BID  . carvedilol  12.5 mg Oral BID WC  . ferrous sulfate  325 mg Oral Q breakfast  . furosemide  80 mg Oral BID  . insulin aspart  0-15 Units Subcutaneous TID  WC  . insulin aspart  0-5 Units Subcutaneous QHS  . insulin glargine  20 Units Subcutaneous Daily  . insulin glargine  50 Units Subcutaneous QHS  . lisinopril  10 mg Oral Daily  . pantoprazole  40 mg Oral Daily  . rosuvastatin  20 mg Oral q1800  . sodium chloride  10-40 mL Intracatheter Q12H  . sodium chloride  3 mL Intravenous Q12H  . venlafaxine  150 mg Oral BID WC    Infusions: . milrinone 0.125 mcg/kg/min (09/01/13 0928)    PRN Medications: sodium chloride, bisacodyl, HYDROcodone-acetaminophen, sodium chloride, sodium chloride   Assessment/Adams    1. NSTEMI  - peak trop 6.7, cath with moderate diffuse disease of LAD 40-60%, septal perforator with 90% lesion. Overall no obvious culprit lesion. Possible demand ischemia in the setting of volume overload. Medical management.  - 08/2013 echo LVEF 20-25%, diffuse hypokinesis.  2. A/C systolic heart failure. EF 20-25%.  3. Microcytic anemia : 4. Afib: Chronic.  5. Hyponatremia/hypokalemia 6. CKD stage 3 -  7.  DM- On insulin  CHF improved continue milrinone at current dose today  Cut in half Monday and D/C Tuesday am  Carboxyhemoglobin reported at 91% ? Arterial sample not from Victoria Adams line    Victoria Adams 7:54 AM

## 2013-09-03 DIAGNOSIS — D649 Anemia, unspecified: Secondary | ICD-10-CM | POA: Diagnosis not present

## 2013-09-03 DIAGNOSIS — I5023 Acute on chronic systolic (congestive) heart failure: Secondary | ICD-10-CM | POA: Diagnosis not present

## 2013-09-03 DIAGNOSIS — I4891 Unspecified atrial fibrillation: Secondary | ICD-10-CM | POA: Diagnosis not present

## 2013-09-03 LAB — CBC WITH DIFFERENTIAL/PLATELET
BASOS ABS: 0.1 10*3/uL (ref 0.0–0.1)
Basophils Relative: 1 % (ref 0–1)
Eosinophils Absolute: 0.7 10*3/uL (ref 0.0–0.7)
Eosinophils Relative: 7 % — ABNORMAL HIGH (ref 0–5)
HEMATOCRIT: 32.1 % — AB (ref 36.0–46.0)
Hemoglobin: 9.6 g/dL — ABNORMAL LOW (ref 12.0–15.0)
LYMPHS PCT: 35 % (ref 12–46)
Lymphs Abs: 3.4 10*3/uL (ref 0.7–4.0)
MCH: 21.3 pg — ABNORMAL LOW (ref 26.0–34.0)
MCHC: 29.9 g/dL — ABNORMAL LOW (ref 30.0–36.0)
MCV: 71.2 fL — ABNORMAL LOW (ref 78.0–100.0)
Monocytes Absolute: 1.1 10*3/uL — ABNORMAL HIGH (ref 0.1–1.0)
Monocytes Relative: 11 % (ref 3–12)
NEUTROS ABS: 4.4 10*3/uL (ref 1.7–7.7)
NEUTROS PCT: 45 % (ref 43–77)
Platelets: 334 10*3/uL (ref 150–400)
RBC: 4.51 MIL/uL (ref 3.87–5.11)
RDW: 20.4 % — AB (ref 11.5–15.5)
WBC: 9.8 10*3/uL (ref 4.0–10.5)

## 2013-09-03 LAB — BASIC METABOLIC PANEL
BUN: 22 mg/dL (ref 6–23)
CALCIUM: 9.4 mg/dL (ref 8.4–10.5)
CO2: 29 mEq/L (ref 19–32)
Chloride: 92 mEq/L — ABNORMAL LOW (ref 96–112)
Creatinine, Ser: 1.5 mg/dL — ABNORMAL HIGH (ref 0.50–1.10)
GFR, EST AFRICAN AMERICAN: 39 mL/min — AB (ref >90)
GFR, EST NON AFRICAN AMERICAN: 34 mL/min — AB (ref >90)
GLUCOSE: 79 mg/dL (ref 70–99)
POTASSIUM: 3.5 meq/L — AB (ref 3.7–5.3)
SODIUM: 133 meq/L — AB (ref 137–147)

## 2013-09-03 LAB — CARBOXYHEMOGLOBIN
CARBOXYHEMOGLOBIN: 1.6 % — AB (ref 0.5–1.5)
Methemoglobin: 0.8 % (ref 0.0–1.5)
O2 SAT: 53.3 %
TOTAL HEMOGLOBIN: 10 g/dL — AB (ref 12.0–16.0)

## 2013-09-03 LAB — PROTIME-INR
INR: 2.24 — AB (ref 0.00–1.49)
PROTHROMBIN TIME: 24.1 s — AB (ref 11.6–15.2)

## 2013-09-03 LAB — GLUCOSE, CAPILLARY
Glucose-Capillary: 157 mg/dL — ABNORMAL HIGH (ref 70–99)
Glucose-Capillary: 118 mg/dL — ABNORMAL HIGH (ref 70–99)
Glucose-Capillary: 158 mg/dL — ABNORMAL HIGH (ref 70–99)
Glucose-Capillary: 166 mg/dL — ABNORMAL HIGH (ref 70–99)

## 2013-09-03 MED ORDER — POTASSIUM CHLORIDE CRYS ER 20 MEQ PO TBCR
40.0000 meq | EXTENDED_RELEASE_TABLET | Freq: Once | ORAL | Status: AC
  Administered 2013-09-03: 40 meq via ORAL
  Filled 2013-09-03: qty 2

## 2013-09-03 NOTE — Discharge Instructions (Signed)
Information on my medicine - ELIQUIS (apixaban)  This medication education was reviewed with me or my healthcare representative as part of my discharge preparation.  The pharmacist that spoke with me during my hospital stay was:  Burna Cash, [facility]  Why was Eliquis prescribed for you? Eliquis was prescribed for you to reduce the risk of a blood clot forming that can cause a stroke if you have a medical condition called atrial fibrillation (a type of irregular heartbeat).  What do You need to know about Eliquis ? Take your Eliquis TWICE DAILY - one tablet in the morning and one tablet in the evening with or without food. If you have difficulty swallowing the tablet whole please discuss with your pharmacist how to take the medication safely.  Take Eliquis exactly as prescribed by your doctor and DO NOT stop taking Eliquis without talking to the doctor who prescribed the medication.  Stopping may increase your risk of developing a stroke.  Refill your prescription before you run out.  After discharge, you should have regular check-up appointments with your healthcare provider that is prescribing your Eliquis.  In the future your dose may need to be changed if your kidney function or weight changes by a significant amount or as you get older.  What do you do if you miss a dose? If you miss a dose, take it as soon as you remember on the same day and resume taking twice daily.  Do not take more than one dose of ELIQUIS at the same time to make up a missed dose.  Important Safety Information A possible side effect of Eliquis is bleeding. You should call your healthcare provider right away if you experience any of the following:   Bleeding from an injury or your nose that does not stop.   Unusual colored urine (red or dark brown) or unusual colored stools (red or black).   Unusual bruising for unknown reasons.   A serious fall or if you hit your head (even if there is no bleeding).  Some  medicines may interact with Eliquis and might increase your risk of bleeding or clotting while on Eliquis. To help avoid this, consult your healthcare provider or pharmacist prior to using any new prescription or non-prescription medications, including herbals, vitamins, non-steroidal anti-inflammatory drugs (NSAIDs) and supplements.  This website has more information on Eliquis (apixaban): [email or web address].

## 2013-09-03 NOTE — Progress Notes (Signed)
Physical Therapy Treatment Patient Details Name: Victoria Adams MRN: [id number] DOB: 04/16/1940 Today's Date: 09/03/2013    History of Present Illness 73 yo female with PMH significant for chronic low back pain, chronic anemia, IDDM-Type 2, chronic Afib on warfarin, and supposed nonischemic dilated cardiomyopathy/HF (last EF 40-45% in 2014) who was admitted 05/13 for likely acute on chronic HF decompensation as well as NSTEMI vs mild troponin elevation related to CHF exacerbation.  Low hgb througout stay; on 5/17, Hgb 8.5    PT Comments    Pt fatigued during session today. States she has been ambulating to/from bathroom all morning trying to have a BM. Pt on RA throughout session and maintained O2 sats >92% throughout gait training. Therapeutic exercise deferred due to pt fatigue after ambulation. Will continue to progress per POC.   Follow Up Recommendations  Home health PT;Supervision/Assistance - 24 hour     Equipment Recommendations  Rolling walker with 5" wheels;3in1 (PT)    Recommendations for Other Services       Precautions / Restrictions Precautions Precautions: Fall Restrictions Weight Bearing Restrictions: No    Mobility  Bed Mobility               General bed mobility comments: Pt received exiting the bathroom with daughter  Transfers Overall transfer level: Needs assistance Equipment used: Rolling walker (2 wheeled) Transfers: Sit to/from Stand Sit to Stand: Supervision         General transfer comment: Increased time required to achieve full standing, with VC's for hand placement on seated surface for safety. Pt continues to pull up from the walker.   Ambulation/Gait Ambulation/Gait assistance: Min guard Ambulation Distance (Feet): 200 Feet Assistive device: Rolling walker (2 wheeled) Gait Pattern/deviations: Step-through pattern;Decreased stride length Gait velocity: Decreased Gait velocity interpretation: Below normal speed for  age/gender General Gait Details: No physical assist required, pt took 2 standing rest breaks due to fatigue, however O2 sats remained stable.   Stairs            Wheelchair Mobility    Modified Rankin (Stroke Patients Only)       Balance Overall balance assessment: Needs assistance Sitting-balance support: Feet supported;No upper extremity supported Sitting balance-Leahy Scale: Fair     Standing balance support: Bilateral upper extremity supported Standing balance-Leahy Scale: Poor                      Cognition Arousal/Alertness: Awake/alert Behavior During Therapy: WFL for tasks assessed/performed Overall Cognitive Status: Within Functional Limits for tasks assessed                      Exercises      General Comments        Pertinent Vitals/Pain See above for O2 sats    Home Living                      Prior Function            PT Goals (current goals can now be found in the care plan section) Acute Rehab PT Goals Patient Stated Goal: get better PT Goal Formulation: With patient Time For Goal Achievement: 09/02/13 Potential to Achieve Goals: Good Progress towards PT goals: Progressing toward goals    Frequency  Min 3X/week    PT Plan Current plan remains appropriate    Co-evaluation             End of Session Equipment Utilized During  Treatment: Gait belt Activity Tolerance: Patient limited by fatigue Patient left: in chair;with call bell/phone within reach;with family/visitor present     Time: 0768-0881 PT Time Calculation (min): 18 min  Charges:  $Gait Training: 8-22 mins                    G Codes:      Jolyn Lent Sep 29, 2013, 3:57 PM  Jolyn Lent, PT, DPT Acute Rehabilitation Services Pager: [phone]

## 2013-09-03 NOTE — Progress Notes (Signed)
Patient ID: Victoria Adams, female   DOB: 01/06/1941, 73 y.o.   MRN: [id number]   Subjective:   Feels well this am  Less dyspnic slept well    [name] with PMH significant for chronic low back pain, chronic anemia, IDDM-Type 2, chronic Afib on warfarin, and supposed nonischemic dilated cardiomyopathy/HF (EF 20-25% by echo 5/15 previous EF 40-45% in 2014). She was transferred from [facility] to [facility] due to HF acute decompensated HF and possible NSTEMI. Peak troponin 6.7 Had LHC 08/24/13 with moderate diffuse mid LAD disease of 40-60% and Septal perforator branch with ostial ~90%.   GI evaluated due to microcytic anemia. Had EGD with no evidence of bleeding or pathology. Coumadin was restarted. Seen again on 5/21 by Dr. Fuller Plan due falling Hgb. Suggested transfusion RBCs. Not felt to be appropriate for colonoscopy due to recent NSTEMI and low EF. (despite fact that coronaries with nonobs CAD)  Did not respond well to IV lasix initially. Milrinone added. Coumadin switched to Eliquis due to persistent slow drop in hgb.  Milrinone cut to 0.0625 yesterday. Continues to diurese well on po lasix. Weight now down 37 pounds. Creatinine stable but co-ox dropped to 53%. CVP 8. Feels fine. No dyspnea. Hgb stable.   ECHO 08/23/13 EF 20-25% RV normal.     Objective:   Weight Range:  Vital Signs:   Temp:  [97.4 F (36.3 C)-98.1 F (36.7 C)] 97.7 F (36.5 C) (05/25 0800) Pulse Rate:  [78-86] 78 (05/25 0900) Resp:  [15-23] 22 (05/25 0900) BP: (125-148)/(53-71) 125/60 mmHg (05/25 0800) SpO2:  [93 %-96 %] 93 % (05/25 0900) Weight:  [108.5 kg (239 lb 3.2 oz)] 108.5 kg (239 lb 3.2 oz) (05/25 0642) Last BM Date: 09/01/13  Weight change: Filed Weights   09/01/13 0500 09/02/13 0605 09/03/13 0642  Weight: 111.2 kg (245 lb 2.4 oz) 112.3 kg (247 lb 9.2 oz) 108.5 kg (239 lb 3.2 oz)    Intake/Output:   Intake/Output Summary (Last 24 hours) at 09/03/13 1039 Last data filed at 09/03/13 0645  Gross per 24  hour  Intake  363.2 ml  Output   4400 ml  Net -4036.8 ml     Physical Exam: CVP 8  PIC LIne RUE  General:  No resp difficulty. Sitting on the side of the bed.   HEENT: normal Neck: supple. JVP 8 Carotids 2+ bilat; no bruits. No lymphadenopathy or thryomegaly appreciated. Cor: PMI nondisplaced. Irregular rate & rhythm. No rubs, gallops or murmurs. Lungs: clear Abdomen: soft, nontender, nondistended. No hepatosplenomegaly. No bruits or masses. Good bowel sounds. Extremities: no cyanosis, clubbing, rash, R and LLE trace edema.  Neuro: alert & orientedx3, cranial nerves grossly intact. moves all 4 extremities w/o difficulty. Affect pleasant  Telemetry: Afib 80-90  Labs: Basic Metabolic Panel:  Recent Labs Lab 08/28/13 0540  08/29/13 0625 08/30/13 0450 08/30/13 0900 08/31/13 0456 09/01/13 0550 09/02/13 0500 09/03/13 0500  NA  --   < > 124*  --  130* 129* 134* 135* 133*  K  --   < > 4.9  --  4.4 3.9 3.7 3.6* 3.5*  CL  --   < > 90*  --  92* 88* 91* 92* 92*  CO2  --   < > 22  --  27 28 31 31 29   GLUCOSE  --   < > 147*  --  109* 82 73 57* 79  BUN  --   < > 32*  --  27* 25* 22  19 22  CREATININE  --   < > 1.65*  --  1.56* 1.57* 1.57* 1.52* 1.50*  CALCIUM  --   < > 8.1*  --  8.3* 8.6 9.2 9.3 9.4  MG 2.9*  --  2.9* 2.5  --   --   --   --   --   < > = values in this interval not displayed.   CBC:  Recent Labs Lab 08/30/13 0450 08/31/13 0456 09/01/13 0550 09/02/13 0500 09/03/13 0500  WBC 7.2 7.6 8.6 9.2 9.8  NEUTROABS 4.0 3.9 4.3 4.1 4.4  HGB 7.8* 8.2* 8.8* 9.3* 9.6*  HCT 25.8* 27.0* 28.3* 29.8* 32.1*  MCV 70.1* 69.8* 69.4* 70.0* 71.2*  PLT 269 299 307 347 334     BNP: BNP (last 3 results)  Recent Labs  08/22/13 0406  PROBNP 1368.0*       Medications:     Scheduled Medications: . apixaban  5 mg Oral BID  . carvedilol  12.5 mg Oral BID WC  . ferrous sulfate  325 mg Oral Q breakfast  . furosemide  80 mg Oral BID  . insulin aspart  0-15 Units  Subcutaneous TID WC  . insulin aspart  0-5 Units Subcutaneous QHS  . insulin glargine  20 Units Subcutaneous Daily  . insulin glargine  50 Units Subcutaneous QHS  . lisinopril  10 mg Oral Daily  . pantoprazole  40 mg Oral Daily  . rosuvastatin  20 mg Oral q1800  . sodium chloride  10-40 mL Intracatheter Q12H  . sodium chloride  3 mL Intravenous Q12H  . venlafaxine  150 mg Oral BID WC    Infusions: . milrinone 0.0625 mcg/kg/min (09/02/13 0808)    PRN Medications: sodium chloride, bisacodyl, HYDROcodone-acetaminophen, sodium chloride, sodium chloride   Assessment/Plan    1. NSTEMI  - peak trop 6.7, cath with moderate diffuse disease of LAD 40-60%, septal perforator with 90% lesion. Overall no obvious culprit lesion. Possible demand ischemia in the setting of volume overload. Medical management.  - 08/2013 echo LVEF 20-25%, diffuse hypokinesis.  2. A/C systolic heart failure. EF 20-25%.  3. Microcytic anemia : 4. Afib: Chronic.  5. Hyponatremia/hypokalemia 6. CKD stage 3 -  7.  DM- On insulin  Volume status, renal function and hemoglobin all look good but co-ox dropping as we wean milrinone. Will continue current regimen today and repeat co-ox in am. If still < 55% may need to consider home milrinone. I had long talk with her and her daughter about option for advanced therapies today. She would like to avoid, if possible but would consider home milrinone if she needs it. Supp K+  Shaune Pascal [name],MD 11:30 AM

## 2013-09-04 DIAGNOSIS — I4891 Unspecified atrial fibrillation: Secondary | ICD-10-CM | POA: Diagnosis not present

## 2013-09-04 DIAGNOSIS — I5023 Acute on chronic systolic (congestive) heart failure: Secondary | ICD-10-CM | POA: Diagnosis not present

## 2013-09-04 LAB — CBC WITH DIFFERENTIAL/PLATELET
BASOS ABS: 0.2 10*3/uL — AB (ref 0.0–0.1)
Basophils Relative: 2 % — ABNORMAL HIGH (ref 0–1)
EOS ABS: 0.5 10*3/uL (ref 0.0–0.7)
Eosinophils Relative: 6 % — ABNORMAL HIGH (ref 0–5)
HCT: 32.3 % — ABNORMAL LOW (ref 36.0–46.0)
Hemoglobin: 9.6 g/dL — ABNORMAL LOW (ref 12.0–15.0)
LYMPHS ABS: 2.6 10*3/uL (ref 0.7–4.0)
Lymphocytes Relative: 29 % (ref 12–46)
MCH: 21.3 pg — AB (ref 26.0–34.0)
MCHC: 29.7 g/dL — ABNORMAL LOW (ref 30.0–36.0)
MCV: 71.8 fL — AB (ref 78.0–100.0)
Monocytes Absolute: 0.9 10*3/uL (ref 0.1–1.0)
Monocytes Relative: 10 % (ref 3–12)
NEUTROS PCT: 53 % (ref 43–77)
Neutro Abs: 4.7 10*3/uL (ref 1.7–7.7)
Platelets: 344 10*3/uL (ref 150–400)
RBC: 4.5 MIL/uL (ref 3.87–5.11)
RDW: 20.5 % — AB (ref 11.5–15.5)
WBC: 8.9 10*3/uL (ref 4.0–10.5)

## 2013-09-04 LAB — BASIC METABOLIC PANEL
BUN: 24 mg/dL — AB (ref 6–23)
CO2: 27 mEq/L (ref 19–32)
CREATININE: 1.4 mg/dL — AB (ref 0.50–1.10)
Calcium: 9.4 mg/dL (ref 8.4–10.5)
Chloride: 92 mEq/L — ABNORMAL LOW (ref 96–112)
GFR calc Af Amer: 43 mL/min — ABNORMAL LOW (ref >90)
GFR calc non Af Amer: 37 mL/min — ABNORMAL LOW (ref >90)
GLUCOSE: 102 mg/dL — AB (ref 70–99)
Potassium: 3.8 mEq/L (ref 3.7–5.3)
Sodium: 133 mEq/L — ABNORMAL LOW (ref 137–147)

## 2013-09-04 LAB — PROTIME-INR
INR: 1.49 (ref 0.00–1.49)
PROTHROMBIN TIME: 17.6 s — AB (ref 11.6–15.2)

## 2013-09-04 LAB — CARBOXYHEMOGLOBIN
Carboxyhemoglobin: 1.6 % — ABNORMAL HIGH (ref 0.5–1.5)
Methemoglobin: 0.8 % (ref 0.0–1.5)
O2 Saturation: 52.9 %
Total hemoglobin: 9.6 g/dL — ABNORMAL LOW (ref 12.0–16.0)

## 2013-09-04 LAB — GLUCOSE, CAPILLARY
Glucose-Capillary: 104 mg/dL — ABNORMAL HIGH (ref 70–99)
Glucose-Capillary: 181 mg/dL — ABNORMAL HIGH (ref 70–99)
Glucose-Capillary: 171 mg/dL — ABNORMAL HIGH (ref 70–99)
Glucose-Capillary: 148 mg/dL — ABNORMAL HIGH (ref 70–99)

## 2013-09-04 MED ORDER — DIGOXIN 125 MCG PO TABS
0.1250 mg | ORAL_TABLET | Freq: Every day | ORAL | Status: DC
  Administered 2013-09-04 – 2013-09-05 (×2): 0.125 mg via ORAL
  Filled 2013-09-04 (×2): qty 1

## 2013-09-04 MED ORDER — CARVEDILOL 6.25 MG PO TABS
6.2500 mg | ORAL_TABLET | Freq: Two times a day (BID) | ORAL | Status: DC
  Administered 2013-09-04 – 2013-09-05 (×2): 6.25 mg via ORAL
  Filled 2013-09-04 (×4): qty 1

## 2013-09-04 MED ORDER — SPIRONOLACTONE 12.5 MG HALF TABLET
12.5000 mg | ORAL_TABLET | Freq: Every day | ORAL | Status: DC
  Administered 2013-09-04 – 2013-09-05 (×2): 12.5 mg via ORAL
  Filled 2013-09-04 (×2): qty 1

## 2013-09-04 MED ORDER — POLYETHYLENE GLYCOL 3350 17 G PO PACK
17.0000 g | PACK | Freq: Every day | ORAL | Status: DC
  Administered 2013-09-04 – 2013-09-05 (×2): 17 g via ORAL
  Filled 2013-09-04 (×2): qty 1

## 2013-09-04 NOTE — Progress Notes (Signed)
Patient ID: Victoria Adams, female   DOB: September 24, 1940, 73 y.o.   MRN: [id number]   Subjective:    [name] with PMH significant for chronic low back pain, chronic anemia, IDDM-Type 2, chronic Afib on warfarin, and supposed nonischemic dilated cardiomyopathy/HF (EF 20-25% by echo 5/15 previous EF 40-45% in 2014). She was transferred from [facility] to [facility] due to HF acute decompensated HF and possible NSTEMI. Peak troponin 6.7 Had LHC 08/24/13 with moderate diffuse mid LAD disease of 40-60% and Septal perforator branch with ostial ~90%.   GI evaluated due to microcytic anemia. Had EGD with no evidence of bleeding or pathology. Coumadin was restarted. Seen again on 5/21 by Dr. Fuller Plan due falling Hgb. Suggested transfusion RBCs. Not felt to be appropriate for colonoscopy due to recent NSTEMI and low EF. (despite fact that coronaries with nonobs CAD)  Did not respond well to IV lasix initially. Milrinone added. Coumadin switched to Eliquis due to persistent slow drop in hgb.  Remains on milrinone 0.0625 and co-ox continues to be low 53%. 24 hr I/O -770 cc and weight down 1 lb. Continues to diurese well on po lasix. Cr and Hgb stable. Denies SOB, orthopnea or CP. Ambulated 200 ft with PT.   CVP 8  ECHO 08/23/13 EF 20-25% RV normal.   Objective:   Weight Range:  Vital Signs:   Temp:  [97.6 F (36.4 C)-97.9 F (36.6 C)] 97.9 F (36.6 C) (05/26 0358) Pulse Rate:  [67-81] 81 (05/26 0358) Resp:  [15-22] 15 (05/26 0358) BP: (122-145)/(56-69) 136/56 mmHg (05/26 0358) SpO2:  [91 %-96 %] 94 % (05/26 0358) Weight:  [238 lb 1.6 oz (108 kg)] 238 lb 1.6 oz (108 kg) (05/26 0358) Last BM Date:  (patient is unsure)  Weight change: Filed Weights   09/02/13 0605 09/03/13 0642 09/04/13 0358  Weight: 247 lb 9.2 oz (112.3 kg) 239 lb 3.2 oz (108.5 kg) 238 lb 1.6 oz (108 kg)    Intake/Output:   Intake/Output Summary (Last 24 hours) at 09/04/13 0745 Last data filed at 09/04/13 0400  Gross per 24 hour   Intake    472 ml  Output   1240 ml  Net   -768 ml     Physical Exam: CVP 8  PIC LIne RUE  General:  No resp difficulty. Sitting up in bed HEENT: normal Neck: supple. JVP 8 Carotids 2+ bilat; no bruits. No lymphadenopathy or thryomegaly appreciated. Cor: PMI nondisplaced. Irregular rate & rhythm. No rubs, gallops or murmurs. Lungs: clear Abdomen: soft, nontender, nondistended. No hepatosplenomegaly. No bruits or masses. Good bowel sounds. Extremities: no cyanosis, clubbing, rash, R and LLE trace edema.  Neuro: alert & orientedx3, cranial nerves grossly intact. moves all 4 extremities w/o difficulty. Affect pleasant  Telemetry: Afib 80-90  Labs: Basic Metabolic Panel:  Recent Labs Lab 08/29/13 0625 08/30/13 0450  08/31/13 0456 09/01/13 0550 09/02/13 0500 09/03/13 0500 09/04/13 0400  NA 124*  --   < > 129* 134* 135* 133* 133*  K 4.9  --   < > 3.9 3.7 3.6* 3.5* 3.8  CL 90*  --   < > 88* 91* 92* 92* 92*  CO2 22  --   < > 28 31 31 29 27   GLUCOSE 147*  --   < > 82 73 57* 79 102*  BUN 32*  --   < > 25* 22 19 22  24*  CREATININE 1.65*  --   < > 1.57* 1.57* 1.52* 1.50* 1.40*  CALCIUM  8.1*  --   < > 8.6 9.2 9.3 9.4 9.4  MG 2.9* 2.5  --   --   --   --   --   --   < > = values in this interval not displayed.   CBC:  Recent Labs Lab 08/31/13 0456 09/01/13 0550 09/02/13 0500 09/03/13 0500 09/04/13 0400  WBC 7.6 8.6 9.2 9.8 8.9  NEUTROABS 3.9 4.3 4.1 4.4 4.7  HGB 8.2* 8.8* 9.3* 9.6* 9.6*  HCT 27.0* 28.3* 29.8* 32.1* 32.3*  MCV 69.8* 69.4* 70.0* 71.2* 71.8*  PLT 299 307 347 334 344     BNP: BNP (last 3 results)  Recent Labs  08/22/13 0406  PROBNP 1368.0*       Medications:     Scheduled Medications: . apixaban  5 mg Oral BID  . carvedilol  12.5 mg Oral BID WC  . ferrous sulfate  325 mg Oral Q breakfast  . furosemide  80 mg Oral BID  . insulin aspart  0-15 Units Subcutaneous TID WC  . insulin aspart  0-5 Units Subcutaneous QHS  . insulin glargine  20  Units Subcutaneous Daily  . insulin glargine  50 Units Subcutaneous QHS  . lisinopril  10 mg Oral Daily  . pantoprazole  40 mg Oral Daily  . rosuvastatin  20 mg Oral q1800  . sodium chloride  10-40 mL Intracatheter Q12H  . sodium chloride  3 mL Intravenous Q12H  . venlafaxine  150 mg Oral BID WC    Infusions: . milrinone 0.0625 mcg/kg/min (09/03/13 1151)    PRN Medications: sodium chloride, bisacodyl, HYDROcodone-acetaminophen, sodium chloride, sodium chloride   Assessment/Plan    1. NSTEMI  - peak trop 6.7, cath with moderate diffuse disease of LAD 40-60%, septal perforator with 90% lesion. Overall no obvious culprit lesion. Possible demand ischemia in the setting of volume overload. Medical management.  - 08/2013 echo LVEF 20-25%, diffuse hypokinesis.  2. A/C systolic heart failure. EF 20-25%.  3. Microcytic anemia : 4. Afib: Chronic.  5. Hyponatremia/hypokalemia 6. CKD stage 3 -  7.  DM- On insulin  Stable overnight. Co-ox remains in the low 50s on milrinone 0.0625 which has been stable. Will stop milrinone today and if co-ox drops less than 50% will need home milrinone.   Volume status and creatinine stable will continue lasix 80 mg PO BID. Will cut BB back to 6.25 mg BID with continued low co-ox. Start spiro 12.5 mg daily. Hgb stable and no s/s of bleeding.   Continue to work with PT and hopefully home tomorrow.   Rande Brunt, NP-C 7:45 AM  Patient seen and examined with Junie Bame, NP. We discussed all aspects of the encounter. I agree with the assessment and plan as stated above.   Improving but remains tenuous. Will stop milrinone today and follow co-ox. If < 50% tomorrow suspect she will need home milrinone. Will cut back carvedilol and add spiro.   They are interested in SNF. Will d/w PT to see what they think.   Discussed with patient and family at bedside.  Shaune Pascal Aarvi Stotts,MD 12:44 PM

## 2013-09-04 NOTE — Progress Notes (Signed)
CARDIAC REHAB PHASE I   PRE:  Rate/Rhythm: 72 afib    BP: sitting 129/58    SaO2: 96 RA  MODE:  Ambulation: 160 ft   POST:  Rate/Rhythm: 97 afib    BP: sitting 140/65     SaO2: 97 RA   Pt to BR then ambulated 160 ft assist x1 with RW. Fairly steady. Rest x1 due to fatigue and some SOB. Pt exhausted after walk and "plopped" onto bed. Attempted to review HF and low sodium. Family declined stating they have many books and know enough. Pts sodium levels were low outpt therefore pt was using the salt shaker some per daughter. [place], ACSM 09/04/2013 3:14 PM

## 2013-09-05 ENCOUNTER — Encounter (HOSPITAL_COMMUNITY): Payer: Self-pay | Admitting: Anesthesiology

## 2013-09-05 DIAGNOSIS — I214 Non-ST elevation (NSTEMI) myocardial infarction: Secondary | ICD-10-CM

## 2013-09-05 DIAGNOSIS — I5023 Acute on chronic systolic (congestive) heart failure: Secondary | ICD-10-CM | POA: Diagnosis not present

## 2013-09-05 LAB — CBC WITH DIFFERENTIAL/PLATELET
Basophils Absolute: 0.1 10*3/uL (ref 0.0–0.1)
Basophils Relative: 1 % (ref 0–1)
Eosinophils Absolute: 0.7 10*3/uL (ref 0.0–0.7)
Eosinophils Relative: 7 % — ABNORMAL HIGH (ref 0–5)
HCT: 33 % — ABNORMAL LOW (ref 36.0–46.0)
Hemoglobin: 10 g/dL — ABNORMAL LOW (ref 12.0–15.0)
LYMPHS ABS: 3 10*3/uL (ref 0.7–4.0)
Lymphocytes Relative: 31 % (ref 12–46)
MCH: 21.9 pg — ABNORMAL LOW (ref 26.0–34.0)
MCHC: 30.3 g/dL (ref 30.0–36.0)
MCV: 72.2 fL — AB (ref 78.0–100.0)
MONOS PCT: 11 % (ref 3–12)
Monocytes Absolute: 1.1 10*3/uL — ABNORMAL HIGH (ref 0.1–1.0)
NEUTROS PCT: 50 % (ref 43–77)
Neutro Abs: 4.7 10*3/uL (ref 1.7–7.7)
Platelets: 322 10*3/uL (ref 150–400)
RBC: 4.57 MIL/uL (ref 3.87–5.11)
RDW: 21.3 % — AB (ref 11.5–15.5)
WBC: 9.6 10*3/uL (ref 4.0–10.5)

## 2013-09-05 LAB — BASIC METABOLIC PANEL
BUN: 26 mg/dL — ABNORMAL HIGH (ref 6–23)
CALCIUM: 9.1 mg/dL (ref 8.4–10.5)
CO2: 25 mEq/L (ref 19–32)
CREATININE: 1.57 mg/dL — AB (ref 0.50–1.10)
Chloride: 93 mEq/L — ABNORMAL LOW (ref 96–112)
GFR calc Af Amer: 37 mL/min — ABNORMAL LOW (ref >90)
GFR calc non Af Amer: 32 mL/min — ABNORMAL LOW (ref >90)
Glucose, Bld: 92 mg/dL (ref 70–99)
Potassium: 4 mEq/L (ref 3.7–5.3)
Sodium: 131 mEq/L — ABNORMAL LOW (ref 137–147)

## 2013-09-05 LAB — CARBOXYHEMOGLOBIN
Carboxyhemoglobin: 1.9 % — ABNORMAL HIGH (ref 0.5–1.5)
Carboxyhemoglobin: 1.7 % — ABNORMAL HIGH (ref 0.5–1.5)
METHEMOGLOBIN: 0.7 % (ref 0.0–1.5)
Methemoglobin: 0.8 % (ref 0.0–1.5)
O2 Saturation: 75.3 %
O2 Saturation: 56.4 %
TOTAL HEMOGLOBIN: 10 g/dL — AB (ref 12.0–16.0)
Total hemoglobin: 10.1 g/dL — ABNORMAL LOW (ref 12.0–16.0)

## 2013-09-05 MED ORDER — CARVEDILOL 6.25 MG PO TABS: 6.2500 mg | ORAL_TABLET | Freq: Two times a day (BID) | ORAL | Status: DC

## 2013-09-05 MED ORDER — FERROUS SULFATE 325 (65 FE) MG PO TABS: 325.0000 mg | ORAL_TABLET | Freq: Every day | ORAL | Status: DC

## 2013-09-05 MED ORDER — FUROSEMIDE 80 MG PO TABS: 80.0000 mg | ORAL_TABLET | Freq: Two times a day (BID) | ORAL | Status: DC

## 2013-09-05 MED ORDER — LISINOPRIL 20 MG PO TABS: ORAL_TABLET | ORAL | Status: DC

## 2013-09-05 MED ORDER — APIXABAN 5 MG PO TABS: 5.0000 mg | ORAL_TABLET | Freq: Two times a day (BID) | ORAL | Status: DC

## 2013-09-05 MED ORDER — SPIRONOLACTONE 25 MG PO TABS: 12.5000 mg | ORAL_TABLET | Freq: Every day | ORAL | Status: DC

## 2013-09-05 NOTE — Progress Notes (Signed)
Patient ID: Victoria Adams, female   DOB: 01/05/1941, 73 y.o.   MRN: [id number]   Subjective:    Victoria Adams 73 yo female with PMH significant for chronic low back pain, chronic anemia, IDDM-Type 2, chronic Afib on warfarin, and supposed nonischemic dilated cardiomyopathy/HF (EF 20-25% by echo 5/15 previous EF 40-45% in 2014). She was transferred from [facility] to [facility] due to HF acute decompensated HF and possible NSTEMI. Peak troponin 6.7 Had LHC 08/24/13 with moderate diffuse mid LAD disease of 40-60% and Septal perforator branch with ostial ~90%.   GI evaluated due to microcytic anemia. Had EGD with no evidence of bleeding or pathology. Coumadin was restarted. Seen again on 5/21 by Dr. Fuller Plan due falling Hgb. Suggested transfusion RBCs. Not felt to be appropriate for colonoscopy due to recent NSTEMI and low EF. (despite fact that coronaries with nonobs CAD)  Did not respond well to IV lasix initially. Milrinone added. Coumadin switched to Eliquis due to persistent slow drop in hgb.  Milrinone stopped yesterday and co-ox much improved from 53% to 75%. Weight down 2 lbs. Denies SOB, orthopnea or CP. Ambulated 160 ft with cardiac rehab. Creatinine 1.57.  CVP 8  ECHO 08/23/13 EF 20-25% RV normal.   Objective:   Weight Range:  Vital Signs:   Temp:  [97.4 F (36.3 C)-98.4 F (36.9 C)] 98.4 F (36.9 C) (05/27 0747) Pulse Rate:  [69-93] 82 (05/27 0747) Resp:  [12-23] 13 (05/27 0747) BP: (110-140)/(51-76) 134/71 mmHg (05/27 0747) SpO2:  [92 %-96 %] 93 % (05/27 0747) Weight:  [236 lb 15.9 oz (107.5 kg)] 236 lb 15.9 oz (107.5 kg) (05/27 0401) Last BM Date: 09/04/13  Weight change: Filed Weights   09/03/13 0642 09/04/13 0358 09/05/13 0401  Weight: 239 lb 3.2 oz (108.5 kg) 238 lb 1.6 oz (108 kg) 236 lb 15.9 oz (107.5 kg)    Intake/Output:   Intake/Output Summary (Last 24 hours) at 09/05/13 0753 Last data filed at 09/05/13 0404  Gross per 24 hour  Intake  614.6 ml  Output    700 ml  Net  -85.4 ml      Physical Exam: CVP 8  PIC LIne RUE  General:  No resp difficulty. Sitting up in bed HEENT: normal Neck: supple. JVP 8 Carotids 2+ bilat; no bruits. No lymphadenopathy or thryomegaly appreciated. Cor: PMI nondisplaced. Irregular rate & rhythm. No rubs, gallops or murmurs. Lungs: clear Abdomen: soft, nontender, nondistended. No hepatosplenomegaly. No bruits or masses. Good bowel sounds. Extremities: no cyanosis, clubbing, rash, R and LLE trace edema.  Neuro: alert & orientedx3, cranial nerves grossly intact. moves all 4 extremities w/o difficulty. Affect pleasant  Telemetry: Afib 80-90  Labs: Basic Metabolic Panel:  Recent Labs Lab 08/30/13 0450  09/01/13 0550 09/02/13 0500 09/03/13 0500 09/04/13 0400 09/05/13 0500  NA  --   < > 134* 135* 133* 133* 131*  K  --   < > 3.7 3.6* 3.5* 3.8 4.0  CL  --   < > 91* 92* 92* 92* 93*  CO2  --   < > 31 31 29 27 25   GLUCOSE  --   < > 73 57* 79 102* 92  BUN  --   < > 22 19 22  24* 26*  CREATININE  --   < > 1.57* 1.52* 1.50* 1.40* 1.57*  CALCIUM  --   < > 9.2 9.3 9.4 9.4 9.1  MG 2.5  --   --   --   --   --   --   < > =  values in this interval not displayed.   CBC:  Recent Labs Lab 09/01/13 0550 09/02/13 0500 09/03/13 0500 09/04/13 0400 09/05/13 0500  WBC 8.6 9.2 9.8 8.9 9.6  NEUTROABS 4.3 4.1 4.4 4.7 4.7  HGB 8.8* 9.3* 9.6* 9.6* 10.0*  HCT 28.3* 29.8* 32.1* 32.3* 33.0*  MCV 69.4* 70.0* 71.2* 71.8* 72.2*  PLT 307 347 334 344 322     BNP: BNP (last 3 results)  Recent Labs  08/22/13 0406  PROBNP 1368.0*       Medications:     Scheduled Medications: . apixaban  5 mg Oral BID  . carvedilol  6.25 mg Oral BID WC  . digoxin  0.125 mg Oral Daily  . ferrous sulfate  325 mg Oral Q breakfast  . furosemide  80 mg Oral BID  . insulin aspart  0-15 Units Subcutaneous TID WC  . insulin aspart  0-5 Units Subcutaneous QHS  . insulin glargine  20 Units Subcutaneous Daily  . insulin glargine  50 Units Subcutaneous QHS  .  lisinopril  10 mg Oral Daily  . pantoprazole  40 mg Oral Daily  . polyethylene glycol  17 g Oral Daily  . rosuvastatin  20 mg Oral q1800  . sodium chloride  10-40 mL Intracatheter Q12H  . sodium chloride  3 mL Intravenous Q12H  . spironolactone  12.5 mg Oral Daily  . venlafaxine  150 mg Oral BID WC    Infusions:    PRN Medications: sodium chloride, bisacodyl, HYDROcodone-acetaminophen, sodium chloride, sodium chloride   Assessment/Plan    1. NSTEMI  - peak trop 6.7, cath with moderate diffuse disease of LAD 40-60%, septal perforator with 90% lesion. Overall no obvious culprit lesion. Possible demand ischemia in the setting of volume overload. Medical management.  - 08/2013 echo LVEF 20-25%, diffuse hypokinesis.  2. A/C systolic heart failure. EF 20-25%.  3. Microcytic anemia : 4. Afib: Chronic.  5. Hyponatremia/hypokalemia 6. CKD stage 3 -  7.  DM- On insulin  Milrinone stopped yesterday and co-ox much improved to 75%, will repeat to make sure it accurate. Volume status appears at baseline. CVP 8 and Cr stable. Will continue lasix 80 mg PO BID.   Continue BB 6.25 mg BID, lisinopril 10 mg daily and spiro 12.5 mg daily  Will need follow up next week in clinic. Home today with PT and RN. Will recheck with PT to see if qualifies for SNF.    Rande Brunt, NP-C 7:53 AM  Addendum: Repeat co-ox 56% and asymptomatic. Will continue with plan to d/c to SNF or home with RN/PT. Waiting PT recommendations.

## 2013-09-05 NOTE — Progress Notes (Signed)
Discharge patient to home with daughter, Beckie Busing.   Discharge instructions given to patient and daughter.  Follow-up  Appointment emphasized and verbalized understanding.

## 2013-09-05 NOTE — Discharge Summary (Signed)
Advanced Heart Failure Team  Discharge Summary   Patient ID: Victoria Adams MRN: [id number], DOB/AGE: 1940/11/05 73 y.o. Admit date: 08/22/2013 D/C date:     09/05/2013   Primary Discharge Diagnoses:  1) Acute on chronic systolic HF - Diuresed with Milrinone and IV lasix - Net negative diuresis -24.5 liters - Discharge weight 236 lbs 2) NSTEMI - peak Troponin 6.7 - LHC 08/24/13: Moderate diffuse mid LAD disease of 40-60% that could composite be hemodynamically significant. There is also a major Septal perforator branch with ostial ~90%.  Moderately reduced LVEF with relatively normal to mildly elevated EDP  Secondary Discharge Diagnoses:  1) Microcytic Anemia - Evaluated by GI and had EGD with no evidence of bleeding or pathology 2) Afib - chronic, placed on Eliquis 3) Hyponatremia 4) Hypokalemia 5) CKF stage III 6) DM2  Hospital Course:  Victoria Adams is a 73 yo female with a history of chronic anemia, HTN, chronic A-Fib, NICM, DM2 and chronic systolic HF.   Presented to [facility] hospital 08/22/13 for severe SOB and CP radiating to L arm. Her initial ECG showed chronic AF and LBBB. She also was experiencing some loose non-bloody stools. Pertinent labs on admission were pro-BNP 1368, iron 13, saturation ratio 4, Troponin 1.45, K+ 4.1, Creatinine 1.29, D-dimer 0.5 and Hgb 8.6. She was admitted to the stepdown for possible NSTEMI and A/C HF and was started on IV lasix and cycle Troponin's. Her Troponin continued to rise and her Coumadin was stopped and heparin started for LHC. Troponin peaked at 6.7 and she was taken for cath on 08/24/13 showing moderate diffuse mid LAD disease and septal perforator branch with ostial ~90% occluced. It was decided to continue with medical management.  She continued to have a drop in her Hgb and had a positive guaiac and GI was consulted. They performed an EGD showing no evidence of bleeding or pathology. Her Hgb was stable and then started trending back down and  they recommended transfusing PRBC for Hgb < 8.0 but to defer colonoscopy at the time d/t severe cardiomyopathy and to follow up as an outpatient. Fortunately her Hgb did not continue to drop and she did not require transfusion. She was given IV iron for low saturation ratio and iron stores. Her coumadin was transitioned to eliquis 5 mg BID.   She continued to be weak and fatigued and PT was order for ambulation. Her Cr and volume status started increasing and the HF team was consulted to help assist with patient. Milrinone was started to help facilitate with diuresis and improve cardiac output and she she had a PICC placed in order to monitor CVPs and hemodynamics. Her BB was cut back with low output symptoms and volume overload. Her initial co-ox was 55% and improved to 68% on milrinone. Her volume status improved and she diuresed a total of 24 liters. Her milrinone was slowly weaned off, however her co-ox continued to drop. It remained stable in the 47s and Dr. Haroldine Laws had a discussion with the patient about potential home with IV milrinone. The patient was not interested in home milrinone if at all possible and the decision was made once milrinone was off if she was asymptomatic, Cr was stable and co-ox was greater than 50% that she could go home without milrinone. Her BB was cut back additionally to 6.25 mg BID to help with low output and she was started on low dose digoxin. She continued to improve with PT and they recommended [facility] and PT.  On day of discharge her VSS and her creatinine was 1.57 and co-ox 56%. She denied any SOB, orthopnea or CP. She will be sent home with close follow up in the HF clinic next week with a CBC, BMET and digoxin level.    Discharge Weight Range: 234-236 lbs  Discharge Vitals: Blood pressure 127/57, pulse 82, temperature 97.5 F (36.4 C), temperature source Oral, resp. rate 22, height 5\' 7"  (1.702 m), weight 236 lb 15.9 oz (107.5 kg), SpO2  97.00%.  Procedures:  ECHO 08/23/13: - Left ventricle: The cavity size was mildly dilated. Wall thickness was increased in a pattern of moderate LVH. Systolic function was severely reduced. The estimated ejection fraction was in the range of 20% to 25%. Diffuse hypokinesis. Severe hypokinesis of the entire myocardium. - Aortic valve: Mildly calcified annulus. - Left atrium: The atrium was severely dilated. - Right atrium: The atrium was mildly dilated.  LHC 08/24/13: Moderate diffuse mid LAD disease of 40-60% that could composite be hemodynamically significant. There is also a major Septal perforator branch with ostial ~90%. Moderately reduced LVEF with relatively normal to mildly elevated EDP  EGD 08/25/13 Normal EGD    Labs: Lab Results  Component Value Date   WBC 9.6 09/05/2013   HGB 10.0* 09/05/2013   HCT 33.0* 09/05/2013   MCV 72.2* 09/05/2013   PLT 322 09/05/2013     Recent Labs Lab 09/05/13 0500  NA 131*  K 4.0  CL 93*  CO2 25  BUN 26*  CREATININE 1.57*  CALCIUM 9.1  GLUCOSE 92   No results found for this basename: CHOL,  HDL,  LDLCALC,  TRIG   BNP (last 3 results)  Recent Labs  08/22/13 0406  PROBNP 1368.0*    Diagnostic Studies/Procedures   No results found.  Discharge Medications     Medication List    STOP taking these medications       metFORMIN 500 MG 24 hr tablet  Commonly known as:  GLUCOPHAGE-XR     torsemide 20 MG tablet  Commonly known as:  DEMADEX     warfarin 5 MG tablet  Commonly known as:  COUMADIN      TAKE these medications       apixaban 5 MG Tabs tablet  Commonly known as:  ELIQUIS  Take 1 tablet (5 mg total) by mouth 2 (two) times daily.     beta carotene w/minerals tablet  Take 2 tablets by mouth daily.     CALCIUM + D PO  Take 1 tablet by mouth 2 (two) times daily.     carvedilol 6.25 MG tablet  Commonly known as:  COREG  Take 1 tablet (6.25 mg total) by mouth 2 (two) times daily with a meal.     digoxin  0.125 MG tablet  Commonly known as:  LANOXIN  Take 125 mcg by mouth daily.     doxycycline 100 MG capsule  Commonly known as:  VIBRAMYCIN  Take 100-200 mg by mouth 2 (two) times daily as needed (rosacea).     ferrous sulfate 325 (65 FE) MG tablet  Take 1 tablet (325 mg total) by mouth daily with breakfast.     furosemide 80 MG tablet  Commonly known as:  LASIX  Take 1 tablet (80 mg total) by mouth 2 (two) times daily.     HYDROcodone-acetaminophen 10-325 MG per tablet  Commonly known as:  NORCO  Take 1 tablet by mouth every 4 (four) hours as needed for moderate pain.  insulin glargine 100 UNIT/ML injection  Commonly known as:  LANTUS  Inject 70-90 Units into the skin 2 (two) times daily. 90 units every morning & 70 units every evening.     iron polysaccharides 150 MG capsule  Commonly known as:  NIFEREX  Take 150 mg by mouth 2 (two) times daily.     lisinopril 20 MG tablet  Commonly known as:  PRINIVIL,ZESTRIL  Take 10 mg (1/2 tablet) daily     metroNIDAZOLE 0.75 % gel  Commonly known as:  METROGEL  Apply 1 application topically 2 (two) times daily as needed (rosacea).     mometasone 0.1 % lotion  Commonly known as:  ELOCON  Apply 1 application topically daily as needed (eczema). EAR ECZEMA     omeprazole 20 MG capsule  Commonly known as:  PRILOSEC  Take 20 mg by mouth 2 (two) times daily.     polyethylene glycol packet  Commonly known as:  MIRALAX / GLYCOLAX  Take 17 g by mouth daily.     rosuvastatin 20 MG tablet  Commonly known as:  CRESTOR  Take 1 tablet (20 mg total) by mouth daily at 6 PM.     spironolactone 25 MG tablet  Commonly known as:  ALDACTONE  Take 0.5 tablets (12.5 mg total) by mouth daily.     venlafaxine 75 MG tablet  Commonly known as:  EFFEXOR  Take 75 mg by mouth 2 (two) times daily.        Disposition   The patient will be discharged in stable condition to home. Discharge Instructions   (HEART FAILURE PATIENTS) Call MD:   Anytime you have any of the following symptoms: 1) 3 pound weight gain in 24 hours or 5 pounds in 1 week 2) shortness of breath, with or without a dry hacking cough 3) swelling in the hands, feet or stomach 4) if you have to sleep on extra pillows at night in order to breathe.    Complete by:  As directed      ACE Inhibitor / ARB already ordered    Complete by:  As directed      Beta Blocker already ordered    Complete by:  As directed      Diet - low sodium heart healthy    Complete by:  As directed      Diet - low sodium heart healthy    Complete by:  As directed      Discharge instructions    Complete by:  As directed   Continue daily weight monitoring and low sodium diet.  Call the office you you gain 2 pounds in 24 hours of 5 pounds in a week.     Discharge instructions    Complete by:  As directed   Please bring all your medications to your visit or you may not be seen.   Do the following things EVERYDAY: Weigh yourself in the morning before breakfast. Write it down and keep it in a log. Take your medicines as prescribed Eat low salt foods-Limit salt (sodium) to 2000 mg per day.  Stay as active as you can everyday Limit all fluids for the day to less than 2 liters     Heart Failure patients record your daily weight using the same scale at the same time of day    Complete by:  As directed      Increase activity slowly    Complete by:  As directed      Increase  activity slowly    Complete by:  As directed      PICC line removal    Complete by:  As directed      STOP any activity that causes chest pain, shortness of breath, dizziness, sweating, or exessive weakness    Complete by:  As directed           Follow-up Information   Follow up with Rozann Lesches, MD. (The office will call you with the follow up appt date and time.)    Specialty:  Cardiology   Contact information:   [address] [place] [address] [phone]       Follow up with [facility]. (Registered Nurse and Physical Therapist - Services to start within 24 - 48 hours of discharge)    Contact information:   [address] [place] [address] [phone]       Follow up with [facility]. (Rolling walker with 5" wheels;3in1 (PT) )    Contact information:   [address] [place] [address] [phone]       Follow up with [facility] On 09/12/2013. (@ 9:30 am. Heart Failure Clinic is located in the Hospital; [place] code 0400. Please bring all your medications to your visit. )    Specialty:  Cardiology   Contact information:   [id number] [place] [address] [phone]        Duration of Discharge Encounter: Greater than 35 minutes   Signed, Rande Brunt  09/05/2013, 2:23 PM  Patient seen and examined with Junie Bame, NP. We discussed all aspects of the encounter. I agree with the assessment and plan as stated above. I have edited the note to reflect my changes.   Galileah Piggee R Mahari Vankirk,MD 1:08 PM

## 2013-09-05 NOTE — Progress Notes (Signed)
Physical Therapy Treatment Patient Details Name: Victoria Adams MRN: [id number] DOB: Sep 02, 1940 Today's Date: 09/05/2013    History of Present Illness 73 yo female with PMH significant for chronic low back pain, chronic anemia, IDDM-Type 2, chronic Afib on warfarin, and supposed nonischemic dilated cardiomyopathy/HF (last EF 40-45% in 2014) who was admitted 05/13 for likely acute on chronic HF decompensation as well as NSTEMI vs mild troponin elevation related to CHF exacerbation.  Low hgb througout stay; on 5/17, Hgb 8.5    PT Comments    Pt progressing towards physical therapy goals. Spoke at length with pt and family regarding safety with mobility at home, HEP technique and frequency, and recommended equipment. Family asking about a hospital bed - do not feel that pt requires a hospital bed at this point, as she states she currently has no difficulty entering/exiting bed while HOB flat and in lowest position. Some question regarding SNF at d/c per MD note this morning. When PT session began pt states that she has decided to return home with her daughter at d/c instead. Pt and family anticipate d/c home this afternoon. Will continue to follow.   Follow Up Recommendations  Home health PT;Supervision for mobility/OOB     Equipment Recommendations  Rolling walker with 5" wheels (Tub bench)    Recommendations for Other Services       Precautions / Restrictions Precautions Precautions: Fall Restrictions Weight Bearing Restrictions: No    Mobility  Bed Mobility               General bed mobility comments: Pt received sitting in recliner with daughter and grandson present in room.   Transfers Overall transfer level: Needs assistance Equipment used: Rolling walker (2 wheeled);None Transfers: Sit to/from Stand Sit to Stand: Supervision;Min guard         General transfer comment: Pt stood without AD and required min guard for steadying assist, and was able to perform stand>sit  with supervision after ambulating with RW.   Ambulation/Gait Ambulation/Gait assistance: Min guard Ambulation Distance (Feet): 175 Feet Assistive device: Rolling walker (2 wheeled) Gait Pattern/deviations: Step-through pattern;Decreased stride length;Trunk flexed Gait velocity: Decreased Gait velocity interpretation: Below normal speed for age/gender General Gait Details: No physical assist required. VC's for pursed-lip breathing to improve O2 sats. Pt states she is limited in distance by hip pain.   Stairs            Wheelchair Mobility    Modified Rankin (Stroke Patients Only)       Balance Overall balance assessment: Needs assistance Sitting-balance support: Feet supported;No upper extremity supported Sitting balance-Leahy Scale: Fair     Standing balance support: Single extremity supported Standing balance-Leahy Scale: Fair Standing balance comment: Was able to stand and take a couple steps with HHA only. For energy conservation, pt used RW for support.                     Cognition Arousal/Alertness: Awake/alert Behavior During Therapy: WFL for tasks assessed/performed Overall Cognitive Status: Within Functional Limits for tasks assessed                      Exercises Total Joint Exercises Ankle Circles/Pumps: 15 reps Quad Sets: 15 reps Gluteal Sets: 15 reps Towel Squeeze: 15 reps Hip ABduction/ADduction: 15 reps Long Arc Quad: 15 reps    General Comments        Pertinent Vitals/Pain Pt on RA throughout session. When ambulation began, sats dropped into the  mid 80's. Standing rest break to focus on breathing technique, and sats improved to 92%. Frequent reminders for pursed-lip breathing during mobility, as sats dropped to 87% when pt stopped performing pursed-lip breathing. Sats as high as 97% when pt consistent with breathing techniques.     Home Living                      Prior Function            PT Goals (current  goals can now be found in the care plan section) Acute Rehab PT Goals Patient Stated Goal: get better PT Goal Formulation: With patient Time For Goal Achievement: 09/02/13 Potential to Achieve Goals: Good Progress towards PT goals: Progressing toward goals    Frequency  Min 3X/week    PT Plan Current plan remains appropriate    Co-evaluation             End of Session Equipment Utilized During Treatment: Gait belt Activity Tolerance: Patient limited by fatigue;Patient limited by pain (in hip) Patient left: in chair;with call bell/phone within reach;with family/visitor present     Time: 6578-4696 PT Time Calculation (min): 31 min  Charges:  $Gait Training: 8-22 mins $Therapeutic Exercise: 8-22 mins                    G Codes:      Jolyn Lent 10-01-2013, 10:51 AM  Jolyn Lent, PT, DPT Acute Rehabilitation Services Pager: [phone]

## 2013-09-06 DIAGNOSIS — I214 Non-ST elevation (NSTEMI) myocardial infarction: Secondary | ICD-10-CM | POA: Diagnosis not present

## 2013-09-06 DIAGNOSIS — D649 Anemia, unspecified: Secondary | ICD-10-CM | POA: Diagnosis not present

## 2013-09-06 DIAGNOSIS — I10 Essential (primary) hypertension: Secondary | ICD-10-CM | POA: Diagnosis not present

## 2013-09-06 DIAGNOSIS — I509 Heart failure, unspecified: Secondary | ICD-10-CM | POA: Diagnosis not present

## 2013-09-06 DIAGNOSIS — I5023 Acute on chronic systolic (congestive) heart failure: Secondary | ICD-10-CM | POA: Diagnosis not present

## 2013-09-06 DIAGNOSIS — E119 Type 2 diabetes mellitus without complications: Secondary | ICD-10-CM | POA: Diagnosis not present

## 2013-09-06 DIAGNOSIS — E669 Obesity, unspecified: Secondary | ICD-10-CM | POA: Diagnosis not present

## 2013-09-06 DIAGNOSIS — I428 Other cardiomyopathies: Secondary | ICD-10-CM | POA: Diagnosis not present

## 2013-09-06 DIAGNOSIS — I2510 Atherosclerotic heart disease of native coronary artery without angina pectoris: Secondary | ICD-10-CM | POA: Diagnosis not present

## 2013-09-06 DIAGNOSIS — I4891 Unspecified atrial fibrillation: Secondary | ICD-10-CM | POA: Diagnosis not present

## 2013-09-06 DIAGNOSIS — Z5181 Encounter for therapeutic drug level monitoring: Secondary | ICD-10-CM | POA: Diagnosis not present

## 2013-09-06 DIAGNOSIS — S50909A Unspecified superficial injury of unspecified elbow, initial encounter: Secondary | ICD-10-CM | POA: Diagnosis not present

## 2013-09-06 LAB — GLUCOSE, CAPILLARY
Glucose-Capillary: 94 mg/dL (ref 70–99)
Glucose-Capillary: 176 mg/dL — ABNORMAL HIGH (ref 70–99)

## 2013-09-08 NOTE — Progress Notes (Signed)
Patient seen and examined with Junie Bame, NP. We discussed all aspects of the encounter. I agree with the assessment and plan as stated above.   Shaune Pascal Kitty Cadavid,MD 3:22 PM

## 2013-09-10 DIAGNOSIS — E119 Type 2 diabetes mellitus without complications: Secondary | ICD-10-CM | POA: Diagnosis not present

## 2013-09-10 DIAGNOSIS — I5023 Acute on chronic systolic (congestive) heart failure: Secondary | ICD-10-CM | POA: Diagnosis not present

## 2013-09-10 DIAGNOSIS — D649 Anemia, unspecified: Secondary | ICD-10-CM | POA: Diagnosis not present

## 2013-09-10 DIAGNOSIS — I2510 Atherosclerotic heart disease of native coronary artery without angina pectoris: Secondary | ICD-10-CM | POA: Diagnosis not present

## 2013-09-10 DIAGNOSIS — I214 Non-ST elevation (NSTEMI) myocardial infarction: Secondary | ICD-10-CM | POA: Diagnosis not present

## 2013-09-10 DIAGNOSIS — I509 Heart failure, unspecified: Secondary | ICD-10-CM | POA: Diagnosis not present

## 2013-09-11 DIAGNOSIS — E119 Type 2 diabetes mellitus without complications: Secondary | ICD-10-CM | POA: Diagnosis not present

## 2013-09-11 DIAGNOSIS — I5023 Acute on chronic systolic (congestive) heart failure: Secondary | ICD-10-CM | POA: Diagnosis not present

## 2013-09-11 DIAGNOSIS — I214 Non-ST elevation (NSTEMI) myocardial infarction: Secondary | ICD-10-CM | POA: Diagnosis not present

## 2013-09-11 DIAGNOSIS — D649 Anemia, unspecified: Secondary | ICD-10-CM | POA: Diagnosis not present

## 2013-09-11 DIAGNOSIS — I509 Heart failure, unspecified: Secondary | ICD-10-CM | POA: Diagnosis not present

## 2013-09-11 DIAGNOSIS — I2510 Atherosclerotic heart disease of native coronary artery without angina pectoris: Secondary | ICD-10-CM | POA: Diagnosis not present

## 2013-09-11 NOTE — Progress Notes (Addendum)
Patient ID: Victoria Adams, female   DOB: May 29, 1940, 73 y.o.   MRN: [id number]  PCP: Dr. Gar Ponto ([facility] in [place]) Primary Cardiologist: Dr. Domenic Polite  HPI: Ms. Trostle is a 73 yo female with a history of chronic anemia, HTN, chronic A-Fib, NICM, DM2 and chronic systolic HF.   Admitted to the hospital 5/13-5/27/15 for A/C HF and NSTEMI. Troponin peaked at 6.7 and was taken for cath 08/24/13 showing moderate diffuse mid LAD and septal perforator branck with ostial ~90% occluded. Treated medically. History of chronic anemia and Hgb continued to drop and GI consulted and they did EGD showing no evidence of bleeding or pathology. Cr started rising and PICC was placed for CVP monitoring and co-oxs. She was placed on milrinone and once weaned off co-ox remained marginal in the mid 47s. Discharge weight 236 lbs.   ECHO: EF 20-25%, RV normal   Post Hospital Follow up for Heart Failure: Doing great. Not sleeping well can't stay asleep. Denies SOB, PND, orthopnea or CP. Weight at home 233 lbs. Walking with walker. Has HHRN/PT. Able to walk from parking garage to clinic with no issues. mild dizziness. Taking medications as prescribed. Following a low salt diet and drinking less than 2L a day. No bleeding issues.  Labs: (09/11/13): Creatinine 1.3, K 4.2, BUN 29  SH: Lives in [place] with son and daughter, no ETOH and does not smoke, retired [place]: Mother deceased: CAD?, HTN, lung cancer, afib        Father deceased: DM2, lung cancer, HTN, CAD stents        3 daughters (1 has afib)        1 son: healthy and alive  ROS: All systems negative except as listed in HPI, PMH and Problem List.  Past Medical History  Diagnosis Date  . Obesity   . Chronic low back pain     Secondary to DJD  . Vitamin D deficiency   . Fatty liver disease, nonalcoholic   . Type 2 diabetes mellitus   . Allergic rhinitis   . Essential hypertension, benign   . Chronic systolic heart failure     a. echo (5/15):  mod LVH, EF  20-25%, diff HK, severe LAE, mild RAE  . GERD (gastroesophageal reflux disease)   . Coronary atherosclerosis of native coronary artery     LHC (5/15):  EF 40-45% global HK; long LAD 40-60%, ostial 1st major septal perf 80-90%, ostial CFX ?%, mid AVCFX extensive Ca2+, dist PDA 50%  . Osteopenia   . Depression   . Rosacea   . Mitral regurgitation   . NICM (nonischemic cardiomyopathy)     a. echo (5/15):  mod LVH, EF 20-25%, diff HK, severe LAE, mild RAE  . Atrial fibrillation   . NSTEMI (non-ST elevated myocardial infarction)   . 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 Iron deficiency anemia   . CKD (chronic kidney disease)     Current Outpatient Prescriptions  Medication Sig Dispense Refill  . apixaban (ELIQUIS) 5 MG TABS tablet Take 1 tablet (5 mg total) by mouth 2 (two) times daily.  60 tablet  3  . beta carotene w/minerals (OCUVITE) tablet Take 2 tablets by mouth daily.      . Calcium Carbonate-Vitamin D (CALCIUM + D PO) Take 1 tablet by mouth 2 (two) times daily.       . carvedilol (COREG) 6.25 MG tablet Take 1 tablet (6.25 mg total) by mouth 2 (two) times daily  with a meal.  60 tablet  3  . digoxin (LANOXIN) 0.125 MG tablet Take 125 mcg by mouth daily.      Marland Kitchen doxycycline (VIBRAMYCIN) 100 MG capsule Take 100-200 mg by mouth 2 (two) times daily as needed (rosacea).       . ferrous sulfate 325 (65 FE) MG tablet Take 1 tablet (325 mg total) by mouth daily with breakfast.  30 tablet  3  . furosemide (LASIX) 80 MG tablet Take 1 tablet (80 mg total) by mouth 2 (two) times daily.  60 tablet  3  . HYDROcodone-acetaminophen (NORCO) 10-325 MG per tablet Take 1 tablet by mouth every 4 (four) hours as needed for moderate pain.       Marland Kitchen insulin glargine (LANTUS) 100 UNIT/ML injection Inject 70-90 Units into the skin 2 (two) times daily. 90 units every morning & 70 units every evening.      . iron polysaccharides (NIFEREX) 150 MG capsule Take 150 mg by mouth 2 (two) times daily.      Marland Kitchen lisinopril  (PRINIVIL,ZESTRIL) 20 MG tablet Take 10 mg (1/2 tablet) daily  15 tablet  3  . metroNIDAZOLE (METROGEL) 0.75 % gel Apply 1 application topically 2 (two) times daily as needed (rosacea).      . mometasone (ELOCON) 0.1 % lotion Apply 1 application topically daily as needed (eczema). EAR ECZEMA      . omeprazole (PRILOSEC) 20 MG capsule Take 20 mg by mouth 2 (two) times daily.      . polyethylene glycol (MIRALAX / GLYCOLAX) packet Take 17 g by mouth daily.  14 each  12  . rosuvastatin (CRESTOR) 20 MG tablet Take 1 tablet (20 mg total) by mouth daily at 6 PM.  30 tablet  5  . spironolactone (ALDACTONE) 25 MG tablet Take 0.5 tablets (12.5 mg total) by mouth daily.  15 tablet  3  . venlafaxine (EFFEXOR) 75 MG tablet Take 75 mg by mouth 2 (two) times daily.        No current facility-administered medications for this encounter.     Filed Vitals:   09/12/13 0917  BP: 121/60  Pulse: 90  Resp: 20  Weight: 237 lb 4 oz (107.616 kg)  SpO2: 92%    PHYSICAL EXAM: General:  Elderly appearing. No resp difficulty, walked in with walker, daughter present.  HEENT: normal Neck: supple. JVP 7 Carotids 2+ bilaterally; no bruits. No lymphadenopathy or thryomegaly appreciated. Cor: PMI normal. Regular rate & irregular rhythm, no rubs, gallops or murmurs. Lungs: clear Abdomen: soft, nontender, nondistended. No hepatosplenomegaly. No bruits or masses. Good bowel sounds. Extremities: no cyanosis, clubbing, rash, edema Neuro: alert & orientedx3, cranial nerves grossly intact. Moves all 4 extremities w/o difficulty. Affect pleasant.   ASSESSMENT & PLAN:  1) Chronic systolic HF: NICM, EF 55-73%, RV nl (08/2013) - Reviewed discharge summary and patient just discharged after A/C HF and NSTEMI - NYHA II symptoms and volume status stable. Will continue lasix 80 mg BID. - Will not titrate BB with recent low output, continue coreg 6.25 mg BID. - Continue lisinopril 10 mg daily and digoxin 0.125 mg daily. Check  digoxin and BMET today.  - Will increase spiro to 25 mg daily.  - Discussed with Dr. Haroldine Laws and with patients long history of HF will refer patient to EP next visit for ICD. She remains in afib so and QRS 154 may need CRT-D with AV node ablation - Reinforced the need and importance of daily weights, a low sodium  diet, and fluid restriction (less than 2 L a day). Instructed to call the HF clinic if weight increases more than 3 lbs overnight or 5 lbs in a week.  2) CAD:  -Moderate diffuse mid LAD disease of 40-60%. Septal perforator branch with ostial ~90%.  - Continue statin, ACE-I and BB 3) Chronic Anemia - EGD with no evidence of bleeding or pathology (08/2013) - check CBC today - continue iron daily 4) CKD, stage III - baseline Cr 1.4-1.6 - With increase in spiro check BMET 7-10 days 5) HTN - stable. As above increasing spiro for LV dysfunction 6) Obesity - Discussed with the patient trying to be as active as possible and to watch portion control    F/U 3 weeks Rande Brunt NP-C 1:24 PM

## 2013-09-12 ENCOUNTER — Ambulatory Visit (HOSPITAL_COMMUNITY)
Admission: RE | Admit: 2013-09-12 | Discharge: 2013-09-12 | Disposition: A | Discharge status: Home / Self Care | Payer: Medicare Other | Source: Ambulatory Visit | Attending: Internal Medicine | Admitting: Internal Medicine

## 2013-09-12 VITALS — BP 121/60 | HR 90 | Resp 20 | Wt 237.2 lb

## 2013-09-12 DIAGNOSIS — I2510 Atherosclerotic heart disease of native coronary artery without angina pectoris: Secondary | ICD-10-CM | POA: Diagnosis not present

## 2013-09-12 DIAGNOSIS — I509 Heart failure, unspecified: Secondary | ICD-10-CM | POA: Diagnosis not present

## 2013-09-12 DIAGNOSIS — I428 Other cardiomyopathies: Secondary | ICD-10-CM | POA: Diagnosis not present

## 2013-09-12 DIAGNOSIS — I252 Old myocardial infarction: Secondary | ICD-10-CM | POA: Insufficient documentation

## 2013-09-12 DIAGNOSIS — I5022 Chronic systolic (congestive) heart failure: Secondary | ICD-10-CM | POA: Diagnosis not present

## 2013-09-12 DIAGNOSIS — I447 Left bundle-branch block, unspecified: Secondary | ICD-10-CM

## 2013-09-12 DIAGNOSIS — D649 Anemia, unspecified: Secondary | ICD-10-CM | POA: Diagnosis not present

## 2013-09-12 DIAGNOSIS — I5023 Acute on chronic systolic (congestive) heart failure: Secondary | ICD-10-CM | POA: Diagnosis not present

## 2013-09-12 DIAGNOSIS — E119 Type 2 diabetes mellitus without complications: Secondary | ICD-10-CM | POA: Insufficient documentation

## 2013-09-12 DIAGNOSIS — I10 Essential (primary) hypertension: Secondary | ICD-10-CM

## 2013-09-12 DIAGNOSIS — N189 Chronic kidney disease, unspecified: Secondary | ICD-10-CM | POA: Diagnosis not present

## 2013-09-12 DIAGNOSIS — I4891 Unspecified atrial fibrillation: Secondary | ICD-10-CM | POA: Insufficient documentation

## 2013-09-12 DIAGNOSIS — I214 Non-ST elevation (NSTEMI) myocardial infarction: Secondary | ICD-10-CM | POA: Diagnosis not present

## 2013-09-12 LAB — CBC
HEMATOCRIT: 36.2 % (ref 36.0–46.0)
HEMOGLOBIN: 11.1 g/dL — AB (ref 12.0–15.0)
MCH: 23 pg — ABNORMAL LOW (ref 26.0–34.0)
MCHC: 30.7 g/dL (ref 30.0–36.0)
MCV: 75.1 fL — AB (ref 78.0–100.0)
Platelets: 307 10*3/uL (ref 150–400)
RBC: 4.82 MIL/uL (ref 3.87–5.11)
RDW: 23.9 % — ABNORMAL HIGH (ref 11.5–15.5)
WBC: 10.4 10*3/uL (ref 4.0–10.5)

## 2013-09-12 LAB — DIGOXIN LEVEL: Digoxin Level: 0.4 ng/mL — ABNORMAL LOW (ref 0.8–2.0)

## 2013-09-12 MED ORDER — SPIRONOLACTONE 25 MG PO TABS: 25.0000 mg | ORAL_TABLET | Freq: Every day | ORAL | Status: DC

## 2013-09-12 NOTE — Patient Instructions (Signed)
Doing great.  Increase your spironolactone to 25 mg (1 tablet) daily.  Call any issues.  Follow up in 3 weeks.  Do the following things EVERYDAY: 1) Weigh yourself in the morning before breakfast. Write it down and keep it in a log. 2) Take your medicines as prescribed 3) Eat low salt foods-Limit salt (sodium) to 2000 mg per day.  4) Stay as active as you can everyday 5) Limit all fluids for the day to less than 2 liters 6)

## 2013-09-12 NOTE — Addendum Note (Signed)
Encounter addended by: Rande Brunt, NP on: 09/12/2013  5:57 PM<BR>     Documentation filed: Notes Section

## 2013-09-12 NOTE — Addendum Note (Signed)
Encounter addended by: Rande Brunt, NP on: 09/12/2013  1:32 PM<BR>     Documentation filed: Notes Section, Follow-up Section

## 2013-09-13 DIAGNOSIS — D649 Anemia, unspecified: Secondary | ICD-10-CM | POA: Diagnosis not present

## 2013-09-13 DIAGNOSIS — I509 Heart failure, unspecified: Secondary | ICD-10-CM | POA: Diagnosis not present

## 2013-09-13 DIAGNOSIS — I214 Non-ST elevation (NSTEMI) myocardial infarction: Secondary | ICD-10-CM | POA: Diagnosis not present

## 2013-09-13 DIAGNOSIS — I2510 Atherosclerotic heart disease of native coronary artery without angina pectoris: Secondary | ICD-10-CM | POA: Diagnosis not present

## 2013-09-13 DIAGNOSIS — E119 Type 2 diabetes mellitus without complications: Secondary | ICD-10-CM | POA: Diagnosis not present

## 2013-09-13 DIAGNOSIS — I5023 Acute on chronic systolic (congestive) heart failure: Secondary | ICD-10-CM | POA: Diagnosis not present

## 2013-09-14 DIAGNOSIS — I5023 Acute on chronic systolic (congestive) heart failure: Secondary | ICD-10-CM | POA: Diagnosis not present

## 2013-09-14 DIAGNOSIS — E119 Type 2 diabetes mellitus without complications: Secondary | ICD-10-CM | POA: Diagnosis not present

## 2013-09-14 DIAGNOSIS — D649 Anemia, unspecified: Secondary | ICD-10-CM | POA: Diagnosis not present

## 2013-09-14 DIAGNOSIS — I509 Heart failure, unspecified: Secondary | ICD-10-CM | POA: Diagnosis not present

## 2013-09-14 DIAGNOSIS — I214 Non-ST elevation (NSTEMI) myocardial infarction: Secondary | ICD-10-CM | POA: Diagnosis not present

## 2013-09-14 DIAGNOSIS — I2510 Atherosclerotic heart disease of native coronary artery without angina pectoris: Secondary | ICD-10-CM | POA: Diagnosis not present

## 2013-09-17 NOTE — Progress Notes (Signed)
Physical Therapy Note  (Late entry for G Code correction)   09-18-2013 1630  PT G-Codes **NOT FOR INPATIENT CLASS**  Functional Assessment Tool Used Clinical Judgement  Functional Limitation Mobility: Walking and moving around  Mobility: Walking and Moving Around Current Status (X9024) CJ  Mobility: Walking and Moving Around Goal Status (O9735) CI    Roney Marion, [place] Pager [phone] Office [phone]

## 2013-09-18 DIAGNOSIS — I214 Non-ST elevation (NSTEMI) myocardial infarction: Secondary | ICD-10-CM | POA: Diagnosis not present

## 2013-09-18 DIAGNOSIS — D649 Anemia, unspecified: Secondary | ICD-10-CM | POA: Diagnosis not present

## 2013-09-18 DIAGNOSIS — I2510 Atherosclerotic heart disease of native coronary artery without angina pectoris: Secondary | ICD-10-CM | POA: Diagnosis not present

## 2013-09-18 DIAGNOSIS — I5023 Acute on chronic systolic (congestive) heart failure: Secondary | ICD-10-CM | POA: Diagnosis not present

## 2013-09-18 DIAGNOSIS — I509 Heart failure, unspecified: Secondary | ICD-10-CM | POA: Diagnosis not present

## 2013-09-18 DIAGNOSIS — E119 Type 2 diabetes mellitus without complications: Secondary | ICD-10-CM | POA: Diagnosis not present

## 2013-09-19 DIAGNOSIS — D649 Anemia, unspecified: Secondary | ICD-10-CM | POA: Diagnosis not present

## 2013-09-19 DIAGNOSIS — I5023 Acute on chronic systolic (congestive) heart failure: Secondary | ICD-10-CM | POA: Diagnosis not present

## 2013-09-19 DIAGNOSIS — I509 Heart failure, unspecified: Secondary | ICD-10-CM | POA: Diagnosis not present

## 2013-09-19 DIAGNOSIS — E119 Type 2 diabetes mellitus without complications: Secondary | ICD-10-CM | POA: Diagnosis not present

## 2013-09-19 DIAGNOSIS — I214 Non-ST elevation (NSTEMI) myocardial infarction: Secondary | ICD-10-CM | POA: Diagnosis not present

## 2013-09-19 DIAGNOSIS — I2510 Atherosclerotic heart disease of native coronary artery without angina pectoris: Secondary | ICD-10-CM | POA: Diagnosis not present

## 2013-09-20 ENCOUNTER — Encounter: Payer: Self-pay | Admitting: Internal Medicine

## 2013-09-20 DIAGNOSIS — D649 Anemia, unspecified: Secondary | ICD-10-CM | POA: Diagnosis not present

## 2013-09-20 DIAGNOSIS — I509 Heart failure, unspecified: Secondary | ICD-10-CM | POA: Diagnosis not present

## 2013-09-20 DIAGNOSIS — I5023 Acute on chronic systolic (congestive) heart failure: Secondary | ICD-10-CM | POA: Diagnosis not present

## 2013-09-20 DIAGNOSIS — E119 Type 2 diabetes mellitus without complications: Secondary | ICD-10-CM | POA: Diagnosis not present

## 2013-09-20 DIAGNOSIS — I2510 Atherosclerotic heart disease of native coronary artery without angina pectoris: Secondary | ICD-10-CM | POA: Diagnosis not present

## 2013-09-20 DIAGNOSIS — I214 Non-ST elevation (NSTEMI) myocardial infarction: Secondary | ICD-10-CM | POA: Diagnosis not present

## 2013-09-21 DIAGNOSIS — I214 Non-ST elevation (NSTEMI) myocardial infarction: Secondary | ICD-10-CM | POA: Diagnosis not present

## 2013-09-21 DIAGNOSIS — E119 Type 2 diabetes mellitus without complications: Secondary | ICD-10-CM | POA: Diagnosis not present

## 2013-09-21 DIAGNOSIS — I2510 Atherosclerotic heart disease of native coronary artery without angina pectoris: Secondary | ICD-10-CM | POA: Diagnosis not present

## 2013-09-21 DIAGNOSIS — I5023 Acute on chronic systolic (congestive) heart failure: Secondary | ICD-10-CM | POA: Diagnosis not present

## 2013-09-21 DIAGNOSIS — I509 Heart failure, unspecified: Secondary | ICD-10-CM | POA: Diagnosis not present

## 2013-09-21 DIAGNOSIS — D649 Anemia, unspecified: Secondary | ICD-10-CM | POA: Diagnosis not present

## 2013-09-24 DIAGNOSIS — I2510 Atherosclerotic heart disease of native coronary artery without angina pectoris: Secondary | ICD-10-CM | POA: Diagnosis not present

## 2013-09-24 DIAGNOSIS — I214 Non-ST elevation (NSTEMI) myocardial infarction: Secondary | ICD-10-CM | POA: Diagnosis not present

## 2013-09-24 DIAGNOSIS — D649 Anemia, unspecified: Secondary | ICD-10-CM | POA: Diagnosis not present

## 2013-09-24 DIAGNOSIS — I509 Heart failure, unspecified: Secondary | ICD-10-CM | POA: Diagnosis not present

## 2013-09-24 DIAGNOSIS — E119 Type 2 diabetes mellitus without complications: Secondary | ICD-10-CM | POA: Diagnosis not present

## 2013-09-24 DIAGNOSIS — I5023 Acute on chronic systolic (congestive) heart failure: Secondary | ICD-10-CM | POA: Diagnosis not present

## 2013-09-26 DIAGNOSIS — I509 Heart failure, unspecified: Secondary | ICD-10-CM | POA: Diagnosis not present

## 2013-09-26 DIAGNOSIS — D649 Anemia, unspecified: Secondary | ICD-10-CM | POA: Diagnosis not present

## 2013-09-26 DIAGNOSIS — I2510 Atherosclerotic heart disease of native coronary artery without angina pectoris: Secondary | ICD-10-CM | POA: Diagnosis not present

## 2013-09-26 DIAGNOSIS — I214 Non-ST elevation (NSTEMI) myocardial infarction: Secondary | ICD-10-CM | POA: Diagnosis not present

## 2013-09-26 DIAGNOSIS — I5023 Acute on chronic systolic (congestive) heart failure: Secondary | ICD-10-CM | POA: Diagnosis not present

## 2013-09-26 DIAGNOSIS — E119 Type 2 diabetes mellitus without complications: Secondary | ICD-10-CM | POA: Diagnosis not present

## 2013-09-28 DIAGNOSIS — I214 Non-ST elevation (NSTEMI) myocardial infarction: Secondary | ICD-10-CM | POA: Diagnosis not present

## 2013-09-28 DIAGNOSIS — I509 Heart failure, unspecified: Secondary | ICD-10-CM | POA: Diagnosis not present

## 2013-09-28 DIAGNOSIS — E119 Type 2 diabetes mellitus without complications: Secondary | ICD-10-CM | POA: Diagnosis not present

## 2013-09-28 DIAGNOSIS — I5023 Acute on chronic systolic (congestive) heart failure: Secondary | ICD-10-CM | POA: Diagnosis not present

## 2013-09-28 DIAGNOSIS — D649 Anemia, unspecified: Secondary | ICD-10-CM | POA: Diagnosis not present

## 2013-09-28 DIAGNOSIS — I2510 Atherosclerotic heart disease of native coronary artery without angina pectoris: Secondary | ICD-10-CM | POA: Diagnosis not present

## 2013-10-03 ENCOUNTER — Ambulatory Visit (HOSPITAL_COMMUNITY)
Admission: RE | Admit: 2013-10-03 | Discharge: 2013-10-03 | Disposition: A | Discharge status: Home / Self Care | Payer: Medicare Other | Source: Ambulatory Visit | Attending: Internal Medicine | Admitting: Internal Medicine

## 2013-10-03 ENCOUNTER — Encounter (HOSPITAL_COMMUNITY): Payer: Self-pay

## 2013-10-03 VITALS — BP 100/55 | HR 84 | Resp 18 | Wt 233.4 lb

## 2013-10-03 DIAGNOSIS — I129 Hypertensive chronic kidney disease with stage 1 through stage 4 chronic kidney disease, or unspecified chronic kidney disease: Secondary | ICD-10-CM | POA: Insufficient documentation

## 2013-10-03 DIAGNOSIS — I2510 Atherosclerotic heart disease of native coronary artery without angina pectoris: Secondary | ICD-10-CM | POA: Diagnosis not present

## 2013-10-03 DIAGNOSIS — I4891 Unspecified atrial fibrillation: Secondary | ICD-10-CM | POA: Insufficient documentation

## 2013-10-03 DIAGNOSIS — I5022 Chronic systolic (congestive) heart failure: Secondary | ICD-10-CM | POA: Insufficient documentation

## 2013-10-03 DIAGNOSIS — E875 Hyperkalemia: Secondary | ICD-10-CM | POA: Insufficient documentation

## 2013-10-03 DIAGNOSIS — I252 Old myocardial infarction: Secondary | ICD-10-CM | POA: Insufficient documentation

## 2013-10-03 DIAGNOSIS — K219 Gastro-esophageal reflux disease without esophagitis: Secondary | ICD-10-CM | POA: Insufficient documentation

## 2013-10-03 DIAGNOSIS — E119 Type 2 diabetes mellitus without complications: Secondary | ICD-10-CM | POA: Insufficient documentation

## 2013-10-03 DIAGNOSIS — I951 Orthostatic hypotension: Secondary | ICD-10-CM | POA: Diagnosis not present

## 2013-10-03 DIAGNOSIS — E559 Vitamin D deficiency, unspecified: Secondary | ICD-10-CM | POA: Diagnosis not present

## 2013-10-03 DIAGNOSIS — N189 Chronic kidney disease, unspecified: Secondary | ICD-10-CM | POA: Insufficient documentation

## 2013-10-03 DIAGNOSIS — K7689 Other specified diseases of liver: Secondary | ICD-10-CM | POA: Insufficient documentation

## 2013-10-03 LAB — BASIC METABOLIC PANEL
BUN: 51 mg/dL — ABNORMAL HIGH (ref 6–23)
CO2: 21 mEq/L (ref 19–32)
Calcium: 9.9 mg/dL (ref 8.4–10.5)
Chloride: 92 mEq/L — ABNORMAL LOW (ref 96–112)
Creatinine, Ser: 1.67 mg/dL — ABNORMAL HIGH (ref 0.50–1.10)
GFR calc Af Amer: 34 mL/min — ABNORMAL LOW (ref >90)
GFR calc non Af Amer: 30 mL/min — ABNORMAL LOW (ref >90)
Glucose, Bld: 161 mg/dL — ABNORMAL HIGH (ref 70–99)
POTASSIUM: 5.8 meq/L — AB (ref 3.7–5.3)
SODIUM: 128 meq/L — AB (ref 137–147)

## 2013-10-03 MED ORDER — SODIUM CHLORIDE 0.9 % IV BOLUS (SEPSIS): 500.0000 mL | Freq: Once | INTRAVENOUS | Status: DC

## 2013-10-03 MED ORDER — FUROSEMIDE 80 MG PO TABS: 40.0000 mg | ORAL_TABLET | Freq: Two times a day (BID) | ORAL | Status: DC

## 2013-10-03 NOTE — Progress Notes (Signed)
Patient symptomatic with orthostatics, SBP 100 to 70s with positional changes, felt very dizzy.  PIV 22g started in RAC, 1000 bolus given.  SBP improved to 110 to 101 with positional changes, no longer dizzy.  PIV removed, pt to be seen back in 1 week.  Instructed to call sooner for complication. [name] Pain

## 2013-10-03 NOTE — Progress Notes (Signed)
Patient ID: Victoria Adams, female   DOB: 08-03-1940, 73 y.o.   MRN: [id number]  PCP: Dr. Gar Ponto ([facility] in [place]) Primary Cardiologist: Dr. Domenic Polite  HPI: Victoria Adams is a 73 yo female with a history of chronic anemia, HTN, chronic A-Fib, NICM, DM2 and chronic systolic HF.   Admitted to the hospital 5/13-5/27/15 for A/C HF and NSTEMI. Troponin peaked at 6.7 and was taken for cath 08/24/13 showing moderate diffuse mid LAD and septal perforator branck with ostial ~90% occluded. Treated medically. History of chronic anemia and Hgb continued to drop and GI consulted and they did EGD showing no evidence of bleeding or pathology. Cr started rising and PICC was placed for CVP monitoring and co-oxs. She was placed on milrinone and once weaned off co-ox remained marginal in the mid 70s. Discharge weight 236 lbs.   She returns for follow up. Last visit spironolactone was increased to 25 mg daily. Complains of nausea and dizziness over the last few days. Weight at home trending down from 233 to 229 pounds. Walking with walker. Has HHRN/PT following with [facility]. Following a low salt diet and drinking less than 2L a day. No bleeding issues.  ECHO: EF 20-25%, RV normal    Labs: (09/11/13): Creatinine 1.3, K 4.2, BUN 29 09/20/13 Creatinine 1.36 K 4.9   SH: Lives in [place] with son and daughter, no ETOH and does not smoke, retired FH: Mother deceased: CAD?, HTN, lung cancer, afib        Father deceased: DM2, lung cancer, HTN, CAD stents        3 daughters (1 has afib)        1 son: healthy and alive  ROS: All systems negative except as listed in HPI, PMH and Problem List.  Past Medical History  Diagnosis Date  . Obesity   . Chronic low back pain     Secondary to DJD  . Vitamin D deficiency   . Fatty liver disease, nonalcoholic   . Type 2 diabetes mellitus   . Allergic rhinitis   . Essential hypertension, benign   . Chronic systolic heart failure     a. echo (5/15):  mod LVH, EF 20-25%, diff HK,  severe LAE, mild RAE  . GERD (gastroesophageal reflux disease)   . Coronary atherosclerosis of native coronary artery     LHC (5/15):  EF 40-45% global HK; long LAD 40-60%, ostial 1st major septal perf 80-90%, ostial CFX ?%, mid AVCFX extensive Ca2+, dist PDA 50%  . Osteopenia   . Depression   . Rosacea   . Mitral regurgitation   . NICM (nonischemic cardiomyopathy)     a. echo (5/15):  mod LVH, EF 20-25%, diff HK, severe LAE, mild RAE  . Atrial fibrillation   . NSTEMI (non-ST elevated myocardial infarction)   . 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 Iron deficiency anemia   . CKD (chronic kidney disease)     Current Outpatient Prescriptions  Medication Sig Dispense Refill  . apixaban (ELIQUIS) 5 MG TABS tablet Take 1 tablet (5 mg total) by mouth 2 (two) times daily.  60 tablet  3  . beta carotene w/minerals (OCUVITE) tablet Take 2 tablets by mouth daily.      . Calcium Carbonate-Vitamin D (CALCIUM + D PO) Take 1 tablet by mouth 2 (two) times daily.       . carvedilol (COREG) 6.25 MG tablet Take 1 tablet (6.25 mg total) by mouth 2 (two) times  daily with a meal.  60 tablet  3  . digoxin (LANOXIN) 0.125 MG tablet Take 125 mcg by mouth daily.      Marland Kitchen doxycycline (VIBRAMYCIN) 100 MG capsule Take 100-200 mg by mouth 2 (two) times daily as needed (rosacea).       . ferrous sulfate 325 (65 FE) MG tablet Take 1 tablet (325 mg total) by mouth daily with breakfast.  30 tablet  3  . furosemide (LASIX) 80 MG tablet Take 1 tablet (80 mg total) by mouth 2 (two) times daily.  60 tablet  3  . HYDROcodone-acetaminophen (NORCO) 10-325 MG per tablet Take 1 tablet by mouth every 4 (four) hours as needed for moderate pain.       Marland Kitchen insulin glargine (LANTUS) 100 UNIT/ML injection Inject 70-90 Units into the skin 2 (two) times daily. 90 units every morning & 70 units every evening.      . iron polysaccharides (NIFEREX) 150 MG capsule Take 150 mg by mouth 2 (two) times daily.      Marland Kitchen lisinopril (PRINIVIL,ZESTRIL) 20  MG tablet Take 10 mg (1/2 tablet) daily  15 tablet  3  . metroNIDAZOLE (METROGEL) 0.75 % gel Apply 1 application topically 2 (two) times daily as needed (rosacea).      . mometasone (ELOCON) 0.1 % lotion Apply 1 application topically daily as needed (eczema). EAR ECZEMA      . omeprazole (PRILOSEC) 20 MG capsule Take 20 mg by mouth 2 (two) times daily.      . polyethylene glycol (MIRALAX / GLYCOLAX) packet Take 17 g by mouth daily.  14 each  12  . rosuvastatin (CRESTOR) 20 MG tablet Take 1 tablet (20 mg total) by mouth daily at 6 PM.  30 tablet  5  . spironolactone (ALDACTONE) 25 MG tablet Take 1 tablet (25 mg total) by mouth daily.  30 tablet  3  . venlafaxine (EFFEXOR) 75 MG tablet Take 75 mg by mouth 2 (two) times daily.        No current facility-administered medications for this encounter.     Filed Vitals:   10/03/13 1458  BP: 100/55  Pulse: 84  Resp: 18  Weight: 233 lb 6 oz (105.858 kg)  SpO2: 98%    Arrival to Clinic Orthostatic BP Sitting 100/70 Standing 70/50   After 1 liter NS Orthostatic BP  Lying 110/62 Sitting 101/53  PHYSICAL EXAM: General:  Elderly appearing. No resp difficulty, walked in with walker, daughter present.  HEENT: normal Neck: supple. JVP flat Carotids 2+ bilaterally; no bruits. No lymphadenopathy or thryomegaly appreciated. Cor: PMI normal. Regular rate & irregular rhythm, no rubs, gallops or murmurs. Lungs: clear Abdomen: soft, nontender, nondistended. No hepatosplenomegaly. No bruits or masses. Good bowel sounds. Extremities: no cyanosis, clubbing, rash, edema Neuro: alert & orientedx3, cranial nerves grossly intact. Moves all 4 extremities w/o difficulty. Affect pleasant.   ASSESSMENT & PLAN:  1) Chronic systolic HF: NICM, EF 40-81%, RV nl (08/2013) - - NYHA II . Complaining of dizziness. She is orthostatic today as noted above. Given 1 liters NS. Orthostatics improved after IV fluids.--->  Lying 110/50 Sitting 101 /53.  Hold diuretics for  the next 2 days.  Then on Saturday she will cut lasix back to 40 mg twice a day. Stop spironolactone.   - Given orthostatic hypotension will not up titrate any medicaitons. . Continue current dose of carvedilol and digoxin 0.125 mg daily.  Stop lisinopril elevated potassium. .   - I notified [place] that  she will need home visit on Friday with orthostatic BP.  Check BMET now  -- Reinforced the need and importance of daily weights, a low sodium diet, and fluid restriction (less than 2 L a day). Instructed to call the HF clinic if weight increases more than 3 lbs overnight or 5 lbs in a week.  2) CAD:  -Moderate diffuse mid LAD disease of 40-60%. Septal perforator branch with ostial ~90%.  - No evidence of ischemia. Continue statin, ACE-I and BB 3) Orthostatic Hypotension- Give 500 cc Normal Saline now. Increase po fluid intake.  4) Hyperkalemia- K 5.8 Stop ace and spiro. Recheck next week  BMET now--> K 5.8 Creatinine 1.6 Stop lisinopril and spironolactone. Diuretics held.    Follow up next week to reassess volume and check BMET. She remained present in clinic 1.5 hours to provide IV fluids and monitor.  CLEGG,AMY NP-C 4:59 PM

## 2013-10-03 NOTE — Patient Instructions (Addendum)
Follow up next week  Hold lasix and stop spironolactone   Stop lisinopril   On Saturday take 40 mg of lasix twice a day. This is 1/2 tablet twice a day   Do the following things EVERYDAY: 1) Weigh yourself in the morning before breakfast. Write it down and keep it in a log. 2) Take your medicines as prescribed 3) Eat low salt foods-Limit salt (sodium) to 2000 mg per day.  4) Stay as active as you can everyday 5) Limit all fluids for the day to less than 2 liters

## 2013-10-05 ENCOUNTER — Telehealth (HOSPITAL_COMMUNITY): Payer: Self-pay | Admitting: *Deleted

## 2013-10-05 DIAGNOSIS — D649 Anemia, unspecified: Secondary | ICD-10-CM | POA: Diagnosis not present

## 2013-10-05 DIAGNOSIS — I2510 Atherosclerotic heart disease of native coronary artery without angina pectoris: Secondary | ICD-10-CM | POA: Diagnosis not present

## 2013-10-05 DIAGNOSIS — E119 Type 2 diabetes mellitus without complications: Secondary | ICD-10-CM | POA: Diagnosis not present

## 2013-10-05 DIAGNOSIS — I5023 Acute on chronic systolic (congestive) heart failure: Secondary | ICD-10-CM | POA: Diagnosis not present

## 2013-10-05 DIAGNOSIS — I509 Heart failure, unspecified: Secondary | ICD-10-CM | POA: Diagnosis not present

## 2013-10-05 DIAGNOSIS — I214 Non-ST elevation (NSTEMI) myocardial infarction: Secondary | ICD-10-CM | POA: Diagnosis not present

## 2013-10-05 NOTE — Telephone Encounter (Signed)
RN with [facility] called to give update on pt, she states BP is stable 110/60 sitting and 112/56 standing wt is stable at 231 lb, lungs clear and pt is feeling better, pt is sch for f/u on 7/2

## 2013-10-09 DIAGNOSIS — I509 Heart failure, unspecified: Secondary | ICD-10-CM | POA: Diagnosis not present

## 2013-10-09 DIAGNOSIS — D649 Anemia, unspecified: Secondary | ICD-10-CM | POA: Diagnosis not present

## 2013-10-09 DIAGNOSIS — I2510 Atherosclerotic heart disease of native coronary artery without angina pectoris: Secondary | ICD-10-CM | POA: Diagnosis not present

## 2013-10-09 DIAGNOSIS — E119 Type 2 diabetes mellitus without complications: Secondary | ICD-10-CM | POA: Diagnosis not present

## 2013-10-09 DIAGNOSIS — I5023 Acute on chronic systolic (congestive) heart failure: Secondary | ICD-10-CM | POA: Diagnosis not present

## 2013-10-09 DIAGNOSIS — I214 Non-ST elevation (NSTEMI) myocardial infarction: Secondary | ICD-10-CM | POA: Diagnosis not present

## 2013-10-11 ENCOUNTER — Encounter (HOSPITAL_COMMUNITY): Payer: Self-pay

## 2013-10-11 ENCOUNTER — Ambulatory Visit (HOSPITAL_COMMUNITY)
Admission: RE | Admit: 2013-10-11 | Discharge: 2013-10-11 | Disposition: A | Discharge status: Home / Self Care | Payer: Medicare Other | Source: Ambulatory Visit | Attending: Internal Medicine | Admitting: Internal Medicine

## 2013-10-11 VITALS — BP 111/60 | HR 74 | Resp 18 | Wt 234.0 lb

## 2013-10-11 DIAGNOSIS — I5022 Chronic systolic (congestive) heart failure: Secondary | ICD-10-CM

## 2013-10-11 DIAGNOSIS — K219 Gastro-esophageal reflux disease without esophagitis: Secondary | ICD-10-CM | POA: Diagnosis not present

## 2013-10-11 DIAGNOSIS — E875 Hyperkalemia: Secondary | ICD-10-CM

## 2013-10-11 DIAGNOSIS — I252 Old myocardial infarction: Secondary | ICD-10-CM | POA: Diagnosis not present

## 2013-10-11 DIAGNOSIS — F3289 Other specified depressive episodes: Secondary | ICD-10-CM | POA: Diagnosis not present

## 2013-10-11 DIAGNOSIS — Z794 Long term (current) use of insulin: Secondary | ICD-10-CM | POA: Insufficient documentation

## 2013-10-11 DIAGNOSIS — I129 Hypertensive chronic kidney disease with stage 1 through stage 4 chronic kidney disease, or unspecified chronic kidney disease: Secondary | ICD-10-CM | POA: Insufficient documentation

## 2013-10-11 DIAGNOSIS — E669 Obesity, unspecified: Secondary | ICD-10-CM | POA: Diagnosis not present

## 2013-10-11 DIAGNOSIS — I4891 Unspecified atrial fibrillation: Secondary | ICD-10-CM

## 2013-10-11 DIAGNOSIS — I447 Left bundle-branch block, unspecified: Secondary | ICD-10-CM

## 2013-10-11 DIAGNOSIS — F329 Major depressive disorder, single episode, unspecified: Secondary | ICD-10-CM | POA: Diagnosis not present

## 2013-10-11 DIAGNOSIS — Z79899 Other long term (current) drug therapy: Secondary | ICD-10-CM | POA: Insufficient documentation

## 2013-10-11 DIAGNOSIS — N183 Chronic kidney disease, stage 3 unspecified: Secondary | ICD-10-CM | POA: Diagnosis not present

## 2013-10-11 DIAGNOSIS — I2510 Atherosclerotic heart disease of native coronary artery without angina pectoris: Secondary | ICD-10-CM | POA: Diagnosis not present

## 2013-10-11 DIAGNOSIS — I482 Chronic atrial fibrillation, unspecified: Secondary | ICD-10-CM

## 2013-10-11 DIAGNOSIS — E119 Type 2 diabetes mellitus without complications: Secondary | ICD-10-CM | POA: Diagnosis not present

## 2013-10-11 DIAGNOSIS — K7689 Other specified diseases of liver: Secondary | ICD-10-CM | POA: Diagnosis not present

## 2013-10-11 DIAGNOSIS — I428 Other cardiomyopathies: Secondary | ICD-10-CM | POA: Insufficient documentation

## 2013-10-11 LAB — BASIC METABOLIC PANEL
ANION GAP: 15 (ref 5–15)
BUN: 26 mg/dL — AB (ref 6–23)
CHLORIDE: 95 meq/L — AB (ref 96–112)
CO2: 23 meq/L (ref 19–32)
CREATININE: 1.21 mg/dL — AB (ref 0.50–1.10)
Calcium: 9.1 mg/dL (ref 8.4–10.5)
GFR calc non Af Amer: 44 mL/min — ABNORMAL LOW (ref >90)
GFR, EST AFRICAN AMERICAN: 51 mL/min — AB (ref >90)
Glucose, Bld: 130 mg/dL — ABNORMAL HIGH (ref 70–99)
POTASSIUM: 5 meq/L (ref 3.7–5.3)
Sodium: 133 mEq/L — ABNORMAL LOW (ref 137–147)

## 2013-10-11 NOTE — Progress Notes (Signed)
Patient ID: [name], female   DOB: Dec 23, 1940, 73 y.o.   MRN: [id number]  PCP: Dr. Gar Ponto ([facility] in [place]) Primary Cardiologist: Dr. Domenic Polite  HPI: Ms. Sachs is a 73 yo female with a history of chronic anemia, HTN, chronic A-Fib, NICM, DM2, CAD and chronic systolic HF.   Admitted to the hospital 5/13-5/27/15 for A/C HF and NSTEMI. Troponin peaked at 6.7 and was taken for cath 08/24/13 showing moderate diffuse mid LAD and septal perforator branck with ostial ~90% occluded. Treated medically. History of chronic anemia and Hgb continued to drop and GI consulted and they did EGD showing no evidence of bleeding or pathology. Cr started rising and PICC was placed for CVP monitoring and co-oxs. She was placed on milrinone and once weaned off co-ox remained marginal in the mid 72s. Discharge weight 236 lbs.   Follow up for Heart Failure: Last visit volume depleted and orthostatic. She received 1L NS and diuretics were held for two days and then lasix decreased to 40 mg BID. Spironolactone and potassium was stopped. Mild dizziness with activity. Overall feeling pretty decent. Weight at home 229 lbs. Denies SOB, PND, orthopnea or CP. Walking with a walker. Following a low salt diet and drinking less than 2L a day.   ECHO: EF 20-25%, RV normal    Labs: (09/11/13): Creatinine 1.3, K 4.2, BUN 29 (09/20/13) Creatinine 1.36 K 4.9  (10/03/13) K 5.8, creatinine 1.67, BUN 51  SH: Lives in [place] with son and daughter, no ETOH and does not smoke, retired [place]: Mother deceased: CAD?, HTN, lung cancer, afib        Father deceased: DM2, lung cancer, HTN, CAD stents        3 daughters (1 has afib)        1 son: healthy and alive  ROS: All systems negative except as listed in HPI, PMH and Problem List.  Past Medical History  Diagnosis Date  . Obesity   . Chronic low back pain     Secondary to DJD  . Vitamin D deficiency   . Fatty liver disease, nonalcoholic   . Type 2 diabetes mellitus   . Allergic  rhinitis   . Essential hypertension, benign   . Chronic systolic heart failure     a. echo (5/15):  mod LVH, EF 20-25%, diff HK, severe LAE, mild RAE  . GERD (gastroesophageal reflux disease)   . Coronary atherosclerosis of native coronary artery     LHC (5/15):  EF 40-45% global HK; long LAD 40-60%, ostial 1st major septal perf 80-90%, ostial CFX ?%, mid AVCFX extensive Ca2+, dist PDA 50%  . Osteopenia   . Depression   . Rosacea   . Mitral regurgitation   . NICM (nonischemic cardiomyopathy)     a. echo (5/15):  mod LVH, EF 20-25%, diff HK, severe LAE, mild RAE  . Atrial fibrillation   . NSTEMI (non-ST elevated myocardial infarction)   . 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 Iron deficiency anemia   . CKD (chronic kidney disease)     Current Outpatient Prescriptions  Medication Sig Dispense Refill  . apixaban (ELIQUIS) 5 MG TABS tablet Take 1 tablet (5 mg total) by mouth 2 (two) times daily.  60 tablet  3  . beta carotene w/minerals (OCUVITE) tablet Take 2 tablets by mouth daily.      . Calcium Carbonate-Vitamin D (CALCIUM + D PO) Take 1 tablet by mouth 2 (two) times daily.       Marland Kitchen  carvedilol (COREG) 6.25 MG tablet Take 1 tablet (6.25 mg total) by mouth 2 (two) times daily with a meal.  60 tablet  3  . digoxin (LANOXIN) 0.125 MG tablet Take 125 mcg by mouth daily.      Marland Kitchen doxycycline (VIBRAMYCIN) 100 MG capsule Take 100-200 mg by mouth 2 (two) times daily as needed (rosacea).       . furosemide (LASIX) 80 MG tablet Take 0.5 tablets (40 mg total) by mouth 2 (two) times daily.  60 tablet  3  . HYDROcodone-acetaminophen (NORCO) 10-325 MG per tablet Take 1 tablet by mouth every 4 (four) hours as needed for moderate pain.       Marland Kitchen insulin glargine (LANTUS) 100 UNIT/ML injection Inject 70-90 Units into the skin 2 (two) times daily. 90 units every morning & 70 units every evening.      . iron polysaccharides (NIFEREX) 150 MG capsule Take 150 mg by mouth 2 (two) times daily.      . metroNIDAZOLE  (METROGEL) 0.75 % gel Apply 1 application topically 2 (two) times daily as needed (rosacea).      . mometasone (ELOCON) 0.1 % lotion Apply 1 application topically daily as needed (eczema). EAR ECZEMA      . omeprazole (PRILOSEC) 20 MG capsule Take 20 mg by mouth 2 (two) times daily.      . polyethylene glycol (MIRALAX / GLYCOLAX) packet Take 17 g by mouth daily.  14 each  12  . rosuvastatin (CRESTOR) 20 MG tablet Take 1 tablet (20 mg total) by mouth daily at 6 PM.  30 tablet  5  . venlafaxine (EFFEXOR) 75 MG tablet Take 75 mg by mouth 2 (two) times daily.       . ferrous sulfate 325 (65 FE) MG tablet Take 1 tablet (325 mg total) by mouth daily with breakfast.  30 tablet  3   No current facility-administered medications for this encounter.     Filed Vitals:   10/11/13 1421  BP: 111/60  Pulse: 74  Resp: 18  Weight: 234 lb (106.142 kg)  SpO2: 97%      PHYSICAL EXAM: General:  Elderly appearing. No resp difficulty, walked in with walker, daughter present.  HEENT: normal Neck: supple. JVP flat Carotids 2+ bilaterally; no bruits. No lymphadenopathy or thryomegaly appreciated. Cor: PMI normal. Regular rate & irregular rhythm, no rubs, gallops or murmurs. Lungs: clear Abdomen: soft, nontender, nondistended. No hepatosplenomegaly. No bruits or masses. Good bowel sounds. Extremities: no cyanosis, clubbing, rash, edema Neuro: alert & orientedx3, cranial nerves grossly intact. Moves all 4 extremities w/o difficulty. Affect pleasant.   ASSESSMENT & PLAN:  1) Chronic systolic HF: NICM, EF 87-57%, RV nl (08/2013) - NYHA II symptoms and volume status much improved with cutting back of diuretics. Will continue lasix 40 mg BID and discussed the use of sliding scale diuretics. Check BMET. - Continue low dose coreg 6.25 mg BID, will not titrate with recent low output.  - Lisinopril and Arlyce Harman have been on hold d/t hyperkalemia. Will check BMET today and if stable will try to restart low dose  lisinopril at night. - Continue digoxin .125 mcg. Last digoxin level 0.4. - With long history of HF will refer to EP for consideration of CRT-D with AV node ablation.  - Reinforced the need and importance of daily weights, a low sodium diet, and fluid restriction (less than 2 L a day). Instructed to call the HF clinic if weight increases more than 3 lbs overnight or  5 lbs in a week.  2) CAD:  -Moderate diffuse mid LAD disease of 40-60%. Septal perforator branch with ostial ~90%.  - No evidence of ischemia. Continue statin and BB. ACE-I on hold.  3) Hyperkalemia- - as above will recheck BMET 4) CKD stage III - baseline Cr 1.4-1.6. Check today. 5) Afib - Chronic with rate controlled. Will continue Eliquis 5 mg BID and BB. No s/s of bleeding.   F/U 1 month Junie Bame B NP-C 2:45 PM

## 2013-10-11 NOTE — Patient Instructions (Signed)
Doing great.  Keep Silver around a little longer.  Call any issues.  Will refer to EP for defibrillator and they will call you.  Follow up in 1 month.  Do the following things EVERYDAY: 1) Weigh yourself in the morning before breakfast. Write it down and keep it in a log. 2) Take your medicines as prescribed 3) Eat low salt foods-Limit salt (sodium) to 2000 mg per day.  4) Stay as active as you can everyday 5) Limit all fluids for the day to less than 2 liters

## 2013-10-16 ENCOUNTER — Telehealth (HOSPITAL_COMMUNITY): Payer: Self-pay | Admitting: Cardiology

## 2013-10-16 DIAGNOSIS — D649 Anemia, unspecified: Secondary | ICD-10-CM | POA: Diagnosis not present

## 2013-10-16 DIAGNOSIS — I509 Heart failure, unspecified: Secondary | ICD-10-CM | POA: Diagnosis not present

## 2013-10-16 DIAGNOSIS — I5023 Acute on chronic systolic (congestive) heart failure: Secondary | ICD-10-CM | POA: Diagnosis not present

## 2013-10-16 DIAGNOSIS — I214 Non-ST elevation (NSTEMI) myocardial infarction: Secondary | ICD-10-CM | POA: Diagnosis not present

## 2013-10-16 DIAGNOSIS — E119 Type 2 diabetes mellitus without complications: Secondary | ICD-10-CM | POA: Diagnosis not present

## 2013-10-16 DIAGNOSIS — I2510 Atherosclerotic heart disease of native coronary artery without angina pectoris: Secondary | ICD-10-CM | POA: Diagnosis not present

## 2013-10-16 NOTE — Telephone Encounter (Signed)
Pt aware and voiced understanding 

## 2013-10-16 NOTE — Telephone Encounter (Signed)
Victoria Adams called to report 3 lb weight gain  Weight normally 229 today 233.8 Mild RLE edema   Per VO Amy Clegg, NP Pt can take additional 20 mg Lasix today then resume 40 mg BID

## 2013-10-19 ENCOUNTER — Other Ambulatory Visit (HOSPITAL_COMMUNITY): Payer: Self-pay

## 2013-10-19 ENCOUNTER — Encounter (HOSPITAL_COMMUNITY): Payer: Self-pay

## 2013-10-19 DIAGNOSIS — I5022 Chronic systolic (congestive) heart failure: Secondary | ICD-10-CM

## 2013-10-25 DIAGNOSIS — D649 Anemia, unspecified: Secondary | ICD-10-CM | POA: Diagnosis not present

## 2013-10-25 DIAGNOSIS — E119 Type 2 diabetes mellitus without complications: Secondary | ICD-10-CM | POA: Diagnosis not present

## 2013-10-25 DIAGNOSIS — I5023 Acute on chronic systolic (congestive) heart failure: Secondary | ICD-10-CM | POA: Diagnosis not present

## 2013-10-25 DIAGNOSIS — I509 Heart failure, unspecified: Secondary | ICD-10-CM | POA: Diagnosis not present

## 2013-10-25 DIAGNOSIS — I2510 Atherosclerotic heart disease of native coronary artery without angina pectoris: Secondary | ICD-10-CM | POA: Diagnosis not present

## 2013-10-25 DIAGNOSIS — I4891 Unspecified atrial fibrillation: Secondary | ICD-10-CM | POA: Diagnosis not present

## 2013-10-25 DIAGNOSIS — I214 Non-ST elevation (NSTEMI) myocardial infarction: Secondary | ICD-10-CM | POA: Diagnosis not present

## 2013-10-31 DIAGNOSIS — I2510 Atherosclerotic heart disease of native coronary artery without angina pectoris: Secondary | ICD-10-CM | POA: Diagnosis not present

## 2013-10-31 DIAGNOSIS — I214 Non-ST elevation (NSTEMI) myocardial infarction: Secondary | ICD-10-CM | POA: Diagnosis not present

## 2013-10-31 DIAGNOSIS — I509 Heart failure, unspecified: Secondary | ICD-10-CM | POA: Diagnosis not present

## 2013-10-31 DIAGNOSIS — E119 Type 2 diabetes mellitus without complications: Secondary | ICD-10-CM | POA: Diagnosis not present

## 2013-10-31 DIAGNOSIS — I5023 Acute on chronic systolic (congestive) heart failure: Secondary | ICD-10-CM | POA: Diagnosis not present

## 2013-10-31 DIAGNOSIS — D649 Anemia, unspecified: Secondary | ICD-10-CM | POA: Diagnosis not present

## 2013-11-01 ENCOUNTER — Telehealth (HOSPITAL_COMMUNITY): Payer: Self-pay | Admitting: Vascular Surgery

## 2013-11-01 DIAGNOSIS — IMO0002 Reserved for concepts with insufficient information to code with codable children: Secondary | ICD-10-CM | POA: Diagnosis not present

## 2013-11-01 DIAGNOSIS — E782 Mixed hyperlipidemia: Secondary | ICD-10-CM | POA: Diagnosis not present

## 2013-11-01 DIAGNOSIS — I10 Essential (primary) hypertension: Secondary | ICD-10-CM | POA: Diagnosis not present

## 2013-11-01 DIAGNOSIS — E669 Obesity, unspecified: Secondary | ICD-10-CM | POA: Diagnosis not present

## 2013-11-01 DIAGNOSIS — K2100 Gastro-esophageal reflux disease with esophagitis, without bleeding: Secondary | ICD-10-CM | POA: Diagnosis not present

## 2013-11-01 DIAGNOSIS — IMO0001 Reserved for inherently not codable concepts without codable children: Secondary | ICD-10-CM | POA: Diagnosis not present

## 2013-11-01 DIAGNOSIS — J309 Allergic rhinitis, unspecified: Secondary | ICD-10-CM | POA: Diagnosis not present

## 2013-11-01 DIAGNOSIS — M1990 Unspecified osteoarthritis, unspecified site: Secondary | ICD-10-CM | POA: Diagnosis not present

## 2013-11-01 NOTE — Telephone Encounter (Signed)
Nurse called pt weight yesterday 234.8 today 234.6 .Marland Kitchen 10/01/13 weight 229.8.Marland KitchenMarland Kitchen In take is down BP IS UP.Marland Kitchen  Pt was up for D/C.Marland Kitchen Nurse is going to resert the pt.. Please advise

## 2013-11-01 NOTE — Telephone Encounter (Signed)
Left message to call back  

## 2013-11-02 NOTE — Telephone Encounter (Signed)
Left message to call back  For both pt and Angie

## 2013-11-05 DIAGNOSIS — E119 Type 2 diabetes mellitus without complications: Secondary | ICD-10-CM | POA: Diagnosis not present

## 2013-11-05 DIAGNOSIS — D649 Anemia, unspecified: Secondary | ICD-10-CM | POA: Diagnosis not present

## 2013-11-05 DIAGNOSIS — I5023 Acute on chronic systolic (congestive) heart failure: Secondary | ICD-10-CM | POA: Diagnosis not present

## 2013-11-05 DIAGNOSIS — I2510 Atherosclerotic heart disease of native coronary artery without angina pectoris: Secondary | ICD-10-CM | POA: Diagnosis not present

## 2013-11-05 DIAGNOSIS — I10 Essential (primary) hypertension: Secondary | ICD-10-CM | POA: Diagnosis not present

## 2013-11-05 DIAGNOSIS — E669 Obesity, unspecified: Secondary | ICD-10-CM | POA: Diagnosis not present

## 2013-11-05 DIAGNOSIS — I4891 Unspecified atrial fibrillation: Secondary | ICD-10-CM | POA: Diagnosis not present

## 2013-11-05 DIAGNOSIS — I509 Heart failure, unspecified: Secondary | ICD-10-CM | POA: Diagnosis not present

## 2013-11-05 DIAGNOSIS — I428 Other cardiomyopathies: Secondary | ICD-10-CM | POA: Diagnosis not present

## 2013-11-05 DIAGNOSIS — I252 Old myocardial infarction: Secondary | ICD-10-CM | POA: Diagnosis not present

## 2013-11-08 DIAGNOSIS — I252 Old myocardial infarction: Secondary | ICD-10-CM | POA: Diagnosis not present

## 2013-11-08 DIAGNOSIS — I509 Heart failure, unspecified: Secondary | ICD-10-CM | POA: Diagnosis not present

## 2013-11-08 DIAGNOSIS — I10 Essential (primary) hypertension: Secondary | ICD-10-CM | POA: Diagnosis not present

## 2013-11-08 DIAGNOSIS — E119 Type 2 diabetes mellitus without complications: Secondary | ICD-10-CM | POA: Diagnosis not present

## 2013-11-08 DIAGNOSIS — I428 Other cardiomyopathies: Secondary | ICD-10-CM | POA: Diagnosis not present

## 2013-11-08 DIAGNOSIS — I5023 Acute on chronic systolic (congestive) heart failure: Secondary | ICD-10-CM | POA: Diagnosis not present

## 2013-11-15 ENCOUNTER — Encounter (HOSPITAL_COMMUNITY): Payer: Medicare Other

## 2013-11-20 DIAGNOSIS — E119 Type 2 diabetes mellitus without complications: Secondary | ICD-10-CM | POA: Diagnosis not present

## 2013-11-20 DIAGNOSIS — I10 Essential (primary) hypertension: Secondary | ICD-10-CM | POA: Diagnosis not present

## 2013-11-20 DIAGNOSIS — I509 Heart failure, unspecified: Secondary | ICD-10-CM | POA: Diagnosis not present

## 2013-11-20 DIAGNOSIS — I5023 Acute on chronic systolic (congestive) heart failure: Secondary | ICD-10-CM | POA: Diagnosis not present

## 2013-11-20 DIAGNOSIS — I252 Old myocardial infarction: Secondary | ICD-10-CM | POA: Diagnosis not present

## 2013-11-20 DIAGNOSIS — I428 Other cardiomyopathies: Secondary | ICD-10-CM | POA: Diagnosis not present

## 2013-11-26 ENCOUNTER — Ambulatory Visit (HOSPITAL_COMMUNITY)
Admission: RE | Admit: 2013-11-26 | Discharge: 2013-11-26 | Disposition: A | Discharge status: Home / Self Care | Payer: Medicare Other | Source: Ambulatory Visit | Attending: Internal Medicine | Admitting: Internal Medicine

## 2013-11-26 ENCOUNTER — Encounter (HOSPITAL_COMMUNITY): Payer: Self-pay

## 2013-11-26 VITALS — BP 152/78 | HR 90 | Wt 231.5 lb

## 2013-11-26 DIAGNOSIS — I059 Rheumatic mitral valve disease, unspecified: Secondary | ICD-10-CM | POA: Insufficient documentation

## 2013-11-26 DIAGNOSIS — I5022 Chronic systolic (congestive) heart failure: Secondary | ICD-10-CM | POA: Diagnosis not present

## 2013-11-26 DIAGNOSIS — F3289 Other specified depressive episodes: Secondary | ICD-10-CM | POA: Insufficient documentation

## 2013-11-26 DIAGNOSIS — E119 Type 2 diabetes mellitus without complications: Secondary | ICD-10-CM | POA: Diagnosis not present

## 2013-11-26 DIAGNOSIS — I447 Left bundle-branch block, unspecified: Secondary | ICD-10-CM

## 2013-11-26 DIAGNOSIS — I428 Other cardiomyopathies: Secondary | ICD-10-CM | POA: Insufficient documentation

## 2013-11-26 DIAGNOSIS — I25811 Atherosclerosis of native coronary artery of transplanted heart without angina pectoris: Secondary | ICD-10-CM | POA: Diagnosis not present

## 2013-11-26 DIAGNOSIS — Z794 Long term (current) use of insulin: Secondary | ICD-10-CM | POA: Insufficient documentation

## 2013-11-26 DIAGNOSIS — I2510 Atherosclerotic heart disease of native coronary artery without angina pectoris: Secondary | ICD-10-CM | POA: Insufficient documentation

## 2013-11-26 DIAGNOSIS — N183 Chronic kidney disease, stage 3 unspecified: Secondary | ICD-10-CM | POA: Diagnosis not present

## 2013-11-26 DIAGNOSIS — M949 Disorder of cartilage, unspecified: Secondary | ICD-10-CM

## 2013-11-26 DIAGNOSIS — E875 Hyperkalemia: Secondary | ICD-10-CM

## 2013-11-26 DIAGNOSIS — I252 Old myocardial infarction: Secondary | ICD-10-CM | POA: Diagnosis not present

## 2013-11-26 DIAGNOSIS — I4891 Unspecified atrial fibrillation: Secondary | ICD-10-CM | POA: Diagnosis not present

## 2013-11-26 DIAGNOSIS — M479 Spondylosis, unspecified: Secondary | ICD-10-CM | POA: Diagnosis not present

## 2013-11-26 DIAGNOSIS — I10 Essential (primary) hypertension: Secondary | ICD-10-CM

## 2013-11-26 DIAGNOSIS — I482 Chronic atrial fibrillation, unspecified: Secondary | ICD-10-CM

## 2013-11-26 DIAGNOSIS — M899 Disorder of bone, unspecified: Secondary | ICD-10-CM | POA: Diagnosis not present

## 2013-11-26 DIAGNOSIS — D509 Iron deficiency anemia, unspecified: Secondary | ICD-10-CM | POA: Insufficient documentation

## 2013-11-26 DIAGNOSIS — E669 Obesity, unspecified: Secondary | ICD-10-CM | POA: Diagnosis not present

## 2013-11-26 DIAGNOSIS — K219 Gastro-esophageal reflux disease without esophagitis: Secondary | ICD-10-CM | POA: Diagnosis not present

## 2013-11-26 DIAGNOSIS — I129 Hypertensive chronic kidney disease with stage 1 through stage 4 chronic kidney disease, or unspecified chronic kidney disease: Secondary | ICD-10-CM | POA: Diagnosis not present

## 2013-11-26 DIAGNOSIS — J309 Allergic rhinitis, unspecified: Secondary | ICD-10-CM | POA: Diagnosis not present

## 2013-11-26 DIAGNOSIS — F329 Major depressive disorder, single episode, unspecified: Secondary | ICD-10-CM | POA: Insufficient documentation

## 2013-11-26 DIAGNOSIS — L719 Rosacea, unspecified: Secondary | ICD-10-CM | POA: Insufficient documentation

## 2013-11-26 DIAGNOSIS — K7689 Other specified diseases of liver: Secondary | ICD-10-CM | POA: Insufficient documentation

## 2013-11-26 DIAGNOSIS — E559 Vitamin D deficiency, unspecified: Secondary | ICD-10-CM | POA: Diagnosis not present

## 2013-11-26 DIAGNOSIS — I509 Heart failure, unspecified: Secondary | ICD-10-CM | POA: Diagnosis not present

## 2013-11-26 LAB — BASIC METABOLIC PANEL
Anion gap: 12 (ref 5–15)
BUN: 16 mg/dL (ref 6–23)
CO2: 27 meq/L (ref 19–32)
CREATININE: 1 mg/dL (ref 0.50–1.10)
Calcium: 9.1 mg/dL (ref 8.4–10.5)
Chloride: 99 mEq/L (ref 96–112)
GFR calc Af Amer: 64 mL/min — ABNORMAL LOW (ref >90)
GFR calc non Af Amer: 55 mL/min — ABNORMAL LOW (ref >90)
Glucose, Bld: 220 mg/dL — ABNORMAL HIGH (ref 70–99)
POTASSIUM: 3.7 meq/L (ref 3.7–5.3)
Sodium: 138 mEq/L (ref 137–147)

## 2013-11-26 LAB — PRO B NATRIURETIC PEPTIDE: Pro B Natriuretic peptide (BNP): 1593 pg/mL — ABNORMAL HIGH (ref 0–125)

## 2013-11-26 MED ORDER — CARVEDILOL 6.25 MG PO TABS: ORAL_TABLET | ORAL | Status: DC

## 2013-11-26 NOTE — Patient Instructions (Addendum)
Doing great.  Will increase coreg to 6.25 mg (1 tablet) in the morning and 9.375 mg (1 1/2 tablets) in the evening.  Will call about lab work.  Follow up in 6 weeks.  Call any issues.   Do the following things EVERYDAY: 1) Weigh yourself in the morning before breakfast. Write it down and keep it in a log. 2) Take your medicines as prescribed 3) Eat low salt foods-Limit salt (sodium) to 2000 mg per day.  4) Stay as active as you can everyday 5) Limit all fluids for the day to less than 2 liters 6)

## 2013-11-26 NOTE — Progress Notes (Addendum)
Patient ID: Victoria Adams, female   DOB: 11/21/1940, 73 y.o.   MRN: [id number]  PCP: Dr. Gar Ponto ([facility] in [place]) Primary Cardiologist: Dr. Domenic Polite  HPI: Victoria Adams is a 73 yo female with a history of chronic anemia, HTN, chronic A-Fib, NICM, DM2, CAD, chronic back pain and chronic systolic HF.   Admitted to the hospital 5/13-5/27/15 for A/C HF and NSTEMI. Troponin peaked at 6.7 and was taken for cath 08/24/13 showing moderate diffuse mid LAD and septal perforator branck with ostial ~90% occluded. Treated medically. History of chronic anemia and Hgb continued to drop and GI consulted and they did EGD showing no evidence of bleeding or pathology. Cr started rising and PICC was placed for CVP monitoring and co-oxs. She was placed on milrinone and once weaned off co-ox remained marginal in the mid 52s. Discharge weight 236 lbs.   Follow up for Heart Failure: Doing well. Denies SOB, orthopnea, PND or CP. Ambulating with walker. Able to walk from parking deck to clinic without stopping. Gets "dizzy headed" which she thinks is vertigo. Following a low salt diet and drinking less than 2L a day. Blood pressure at home has been elevated 140-180s/80-90s. Weight at home 229-231 lbs. +fatgiue.    ECHO (08/2013) EF 20-25%, RV normal    Labs: (09/11/13): Creatinine 1.3, K 4.2, BUN 29, dig level 0.4 (09/20/13) Creatinine 1.36 K 4.9  (10/03/13) K 5.8, creatinine 1.67, BUN 51 (10/11/13) K 5.0, creatinine 1.21, BUN 26  SH: Lives in [place] with son and daughter, no ETOH and does not smoke, retired FH: Mother deceased: CAD?, HTN, lung cancer, afib        Father deceased: DM2, lung cancer, HTN, CAD stents        3 daughters (1 has afib)        1 son: healthy and alive  ROS: All systems negative except as listed in HPI, PMH and Problem List.  Past Medical History  Diagnosis Date  . Obesity   . Chronic low back pain     Secondary to DJD  . Vitamin D deficiency   . Fatty liver disease, nonalcoholic   .  Type 2 diabetes mellitus   . Allergic rhinitis   . Essential hypertension, benign   . Chronic systolic heart failure     a. echo (5/15):  mod LVH, EF 20-25%, diff HK, severe LAE, mild RAE  . GERD (gastroesophageal reflux disease)   . Coronary atherosclerosis of native coronary artery     LHC (5/15):  EF 40-45% global HK; long LAD 40-60%, ostial 1st major septal perf 80-90%, ostial CFX ?%, mid AVCFX extensive Ca2+, dist PDA 50%  . Osteopenia   . Depression   . Rosacea   . Mitral regurgitation   . NICM (nonischemic cardiomyopathy)     a. echo (5/15):  mod LVH, EF 20-25%, diff HK, severe LAE, mild RAE  . Atrial fibrillation   . NSTEMI (non-ST elevated myocardial infarction)   . 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 Iron deficiency anemia   . CKD (chronic kidney disease)     Current Outpatient Prescriptions  Medication Sig Dispense Refill  . apixaban (ELIQUIS) 5 MG TABS tablet Take 1 tablet (5 mg total) by mouth 2 (two) times daily.  60 tablet  3  . beta carotene w/minerals (OCUVITE) tablet Take 2 tablets by mouth daily.      . Calcium Carbonate-Vitamin D (CALCIUM + D PO) Take 1 tablet by mouth  2 (two) times daily.       . carvedilol (COREG) 6.25 MG tablet Take 1 tablet (6.25 mg total) by mouth 2 (two) times daily with a meal.  60 tablet  3  . digoxin (LANOXIN) 0.125 MG tablet Take 125 mcg by mouth daily.      . ferrous sulfate 325 (65 FE) MG tablet Take 1 tablet (325 mg total) by mouth daily with breakfast.  30 tablet  3  . furosemide (LASIX) 80 MG tablet Take 80 mg by mouth 2 (two) times daily.      . insulin glargine (LANTUS) 100 UNIT/ML injection Inject 70-90 Units into the skin 2 (two) times daily. 63 units in the evening and 63 units at night.      . metroNIDAZOLE (METROGEL) 0.75 % gel Apply 1 application topically 2 (two) times daily as needed (rosacea).      . mometasone (ELOCON) 0.1 % lotion Apply 1 application topically daily as needed (eczema). EAR ECZEMA      . omeprazole  (PRILOSEC) 20 MG capsule Take 20 mg by mouth 2 (two) times daily.      Marland Kitchen oxyCODONE-acetaminophen (PERCOCET) 10-325 MG per tablet Take 1 tablet by mouth every 4 (four) hours as needed for pain.      . polyethylene glycol (MIRALAX / GLYCOLAX) packet Take 17 g by mouth daily.  14 each  12  . rosuvastatin (CRESTOR) 20 MG tablet Take 1 tablet (20 mg total) by mouth daily at 6 PM.  30 tablet  5  . venlafaxine (EFFEXOR) 75 MG tablet Take 75 mg by mouth 2 (two) times daily.       Marland Kitchen doxycycline (VIBRAMYCIN) 100 MG capsule Take 100-200 mg by mouth 2 (two) times daily as needed (rosacea).        No current facility-administered medications for this encounter.     Filed Vitals:   11/26/13 1508  BP: 152/78  Pulse: 90  Weight: 231 lb 8 oz (105.008 kg)  SpO2: 94%     PHYSICAL EXAM: General:  Elderly appearing. No resp difficulty, walked in with walker, daughter present.  HEENT: normal Neck: supple. JVP 7; Carotids 2+ bilaterally; no bruits. No lymphadenopathy or thryomegaly appreciated. Cor: PMI normal. Regular rate & irregular rhythm, no rubs, gallops or murmurs. Lungs: clear Abdomen: soft, nontender, nondistended. No hepatosplenomegaly. No bruits or masses. Good bowel sounds. Extremities: no cyanosis, clubbing, rash, edema Neuro: alert & orientedx3, cranial nerves grossly intact. Moves all 4 extremities w/o difficulty. Affect pleasant.   ASSESSMENT & PLAN:  1) Chronic systolic HF: NICM, EF 68-03%, RV nl (08/2013) - NYHA II symptoms and volume status stable. Will continue lasix 80 mg BID and discussed the use of sliding scale diuretics. Check BMET and pro-BNP today - Will increase coreg to 6.25 mg qam and 9.375 mg qpm. Will go slowly increasing BB with recent history of low output.  - Lisinopril and Arlyce Harman have been held d/t hyperkalemia. Will check BMET today and if K ok will restart lisinopril 5 mg daily, however if remains elevated will start hydralazine and nitrates.  - Continue digoxin .125  mcg. Last digoxin level 0.4 (09/2013) - With long history of HF referred her last visit to EP for consideration of CRT-D with AV node ablation, appt next week.  - Reinforced the need and importance of daily weights, a low sodium diet, and fluid restriction (less than 2 L a day). Instructed to call the HF clinic if weight increases more than 3 lbs overnight  or 5 lbs in a week.  2) CAD:  -Moderate diffuse mid LAD disease of 40-60%. Septal perforator branch with ostial ~90%.  - No evidence of ischemia. Continue statin and BB. As above will restart ACE-I if K+ ok 3) Hyperkalemia- - as above will recheck BMET 4) CKD stage III - baseline Cr 1.4-1.6. Check today. 5) Afib - Chronic with rate controlled. Will continue Eliquis 5 mg BID and BB. No s/s of bleeding.  6) HTN - Patient's blood pressure is elevated today and reports that her SBP at home has been 150-180 which is very different from last visit. Waiting on BMET and depending on K+ will restart lisinopril 5 mg daily or Hydralazine 12.5 mg TID with Imdur 30 mg daily.  F/U 6 weeks with MD. Hoping to titrate meds a few more visits and then transition back to Dr. Jovita Gamma, Deatra Canter B NP-C 3:13 PM  Addendum: Reviewed labs and K+ 3.7, creatinine 1.00, BUN 16 and pro-BNP 1593. Will restart lisinopril 5 mg daily and check BMET next week with Dr. Olin Pia visit.

## 2013-11-27 ENCOUNTER — Other Ambulatory Visit (HOSPITAL_COMMUNITY): Payer: Self-pay

## 2013-11-27 ENCOUNTER — Telehealth (HOSPITAL_COMMUNITY): Payer: Self-pay

## 2013-11-27 DIAGNOSIS — I5022 Chronic systolic (congestive) heart failure: Secondary | ICD-10-CM

## 2013-11-27 MED ORDER — LISINOPRIL 5 MG PO TABS: 5.0000 mg | ORAL_TABLET | Freq: Every day | ORAL | Status: DC

## 2013-11-27 NOTE — Telephone Encounter (Signed)
Lab results reviewed with patient, instructed to start lisinopril 5mg  once daily.  Sent to preferred pharmacy electronically.  Informed will have BEMT rechecked at appointment with klein next week, order placed and appointment note updated.  Note and labs faxed to PCP office.

## 2013-11-29 DIAGNOSIS — I5023 Acute on chronic systolic (congestive) heart failure: Secondary | ICD-10-CM | POA: Diagnosis not present

## 2013-11-29 DIAGNOSIS — E119 Type 2 diabetes mellitus without complications: Secondary | ICD-10-CM | POA: Diagnosis not present

## 2013-11-29 DIAGNOSIS — I509 Heart failure, unspecified: Secondary | ICD-10-CM | POA: Diagnosis not present

## 2013-11-29 DIAGNOSIS — I252 Old myocardial infarction: Secondary | ICD-10-CM | POA: Diagnosis not present

## 2013-11-29 DIAGNOSIS — I10 Essential (primary) hypertension: Secondary | ICD-10-CM | POA: Diagnosis not present

## 2013-11-29 DIAGNOSIS — I428 Other cardiomyopathies: Secondary | ICD-10-CM | POA: Diagnosis not present

## 2013-12-04 DIAGNOSIS — I5023 Acute on chronic systolic (congestive) heart failure: Secondary | ICD-10-CM | POA: Diagnosis not present

## 2013-12-04 DIAGNOSIS — I214 Non-ST elevation (NSTEMI) myocardial infarction: Secondary | ICD-10-CM | POA: Diagnosis not present

## 2013-12-04 DIAGNOSIS — I509 Heart failure, unspecified: Secondary | ICD-10-CM | POA: Diagnosis not present

## 2013-12-04 DIAGNOSIS — E119 Type 2 diabetes mellitus without complications: Secondary | ICD-10-CM | POA: Diagnosis not present

## 2013-12-05 DIAGNOSIS — E119 Type 2 diabetes mellitus without complications: Secondary | ICD-10-CM | POA: Diagnosis not present

## 2013-12-05 DIAGNOSIS — I5023 Acute on chronic systolic (congestive) heart failure: Secondary | ICD-10-CM | POA: Diagnosis not present

## 2013-12-05 DIAGNOSIS — I10 Essential (primary) hypertension: Secondary | ICD-10-CM | POA: Diagnosis not present

## 2013-12-05 DIAGNOSIS — I252 Old myocardial infarction: Secondary | ICD-10-CM | POA: Diagnosis not present

## 2013-12-05 DIAGNOSIS — I428 Other cardiomyopathies: Secondary | ICD-10-CM | POA: Diagnosis not present

## 2013-12-05 DIAGNOSIS — I509 Heart failure, unspecified: Secondary | ICD-10-CM | POA: Diagnosis not present

## 2013-12-06 ENCOUNTER — Encounter: Payer: Self-pay | Admitting: Internal Medicine

## 2013-12-06 ENCOUNTER — Ambulatory Visit (INDEPENDENT_AMBULATORY_CARE_PROVIDER_SITE_OTHER): Payer: Medicare Other | Admitting: Internal Medicine

## 2013-12-06 VITALS — BP 122/60 | HR 73 | Ht 66.0 in | Wt 231.6 lb

## 2013-12-06 DIAGNOSIS — I5022 Chronic systolic (congestive) heart failure: Secondary | ICD-10-CM | POA: Diagnosis not present

## 2013-12-06 DIAGNOSIS — E119 Type 2 diabetes mellitus without complications: Secondary | ICD-10-CM | POA: Diagnosis not present

## 2013-12-06 DIAGNOSIS — I509 Heart failure, unspecified: Secondary | ICD-10-CM | POA: Diagnosis not present

## 2013-12-06 DIAGNOSIS — Z01812 Encounter for preprocedural laboratory examination: Secondary | ICD-10-CM | POA: Diagnosis not present

## 2013-12-06 DIAGNOSIS — I5023 Acute on chronic systolic (congestive) heart failure: Secondary | ICD-10-CM | POA: Diagnosis not present

## 2013-12-06 DIAGNOSIS — I4891 Unspecified atrial fibrillation: Secondary | ICD-10-CM | POA: Diagnosis not present

## 2013-12-06 DIAGNOSIS — I10 Essential (primary) hypertension: Secondary | ICD-10-CM | POA: Diagnosis not present

## 2013-12-06 DIAGNOSIS — I2510 Atherosclerotic heart disease of native coronary artery without angina pectoris: Secondary | ICD-10-CM | POA: Diagnosis not present

## 2013-12-06 NOTE — Progress Notes (Signed)
ELECTROPHYSIOLOGY CONSULT NOTE  Patient ID: Victoria Adams, MRN: [id number], DOB/AGE: [birth year] 73 y.o. Admit date: (Not on file) Date of Consult: 12/06/2013  Primary Physician: Gar Ponto, MD Primary Cardiologist: SM  Chief Complaint: ICD   HPI Victoria Adams is a 73 y.o. female  Seen for consideration of ICD implantation with consideration of AV junction ablation  She is a history of a nonischemic cardiomyopathy with an ejection fraction 5/15-20% with severe biatrial left greater than right enlargement (55/2.5). Catheterization 5/15 demonstrated moderate LAD disease with 40-60% with a 9 0% septal perforator.   She is hospitalized for this 5/15 and was treated transiently with milrinone. For Her heart failure has been further complicated by iron deficiency anemia.  With guaiac positive stool. Outpatient GI evaluation with anticipated. Ejection fraction 7/14 demonstrated 40-45%.  She is also known to have atrial fibrillation which is permanent and left bundle branch block.    Past Medical History  Diagnosis Date  . Obesity   . Chronic low back pain     Secondary to DJD  . Vitamin D deficiency   . Fatty liver disease, nonalcoholic   . Type 2 diabetes mellitus   . Allergic rhinitis   . Essential hypertension, benign   . Chronic systolic heart failure     a. echo (5/15):  mod LVH, EF 20-25%, diff HK, severe LAE, mild RAE  . GERD (gastroesophageal reflux disease)   . Coronary atherosclerosis of native coronary artery     LHC (5/15):  EF 40-45% global HK; long LAD 40-60%, ostial 1st major septal perf 80-90%, ostial CFX ?%, mid AVCFX extensive Ca2+, dist PDA 50%  . Osteopenia   . Depression   . Rosacea   . Mitral regurgitation   . NICM (nonischemic cardiomyopathy)     a. echo (5/15):  mod LVH, EF 20-25%, diff HK, severe LAE, mild RAE  . Atrial fibrillation   . NSTEMI (non-ST elevated myocardial infarction)   . 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 Iron deficiency anemia    . CKD (chronic kidney disease)       Surgical History:  Past Surgical History  Procedure Laterality Date  . Total abdominal hysterectomy w/ bilateral salpingoophorectomy      for dermoid tumor  . Appendectomy    . Cataract extraction    . Cholecystectomy    . Tonsillectomy    . Rotator cuff repair      RIGHT SHOULDER  . Breast biopsy      RIGHT  . Abdominal hysterectomy    . Cardiac catheterization  2010    LAD 50%, CFX 30%, RCA 40%; EF by echo 25-35%  . Esophagogastroduodenoscopy N/A 08/25/2013    Procedure: ESOPHAGOGASTRODUODENOSCOPY (EGD);  Surgeon: Milus Banister, MD;  Location: [facility];  Service: Endoscopy;  Laterality: N/A;     Home Meds: Prior to Admission medications   Medication Sig Start Date End Date Taking? Authorizing Provider  apixaban (ELIQUIS) 5 MG TABS tablet Take 1 tablet (5 mg total) by mouth 2 (two) times daily. 09/05/13  Yes Rande Brunt, NP  beta carotene w/minerals (OCUVITE) tablet Take 2 tablets by mouth daily.   Yes Historical Provider, MD  Calcium Carbonate-Vitamin D (CALCIUM + D PO) Take 1 tablet by mouth 2 (two) times daily.    Yes Historical Provider, MD  carvedilol (COREG) 6.25 MG tablet Take 6.25 mg (1 tablet) in the morning and 9.375 mg (1 1/2 tablets) in the  evening. 11/26/13  Yes Rande Brunt, NP  digoxin (LANOXIN) 0.125 MG tablet Take 125 mcg by mouth daily.   Yes Historical Provider, MD  doxycycline (VIBRAMYCIN) 100 MG capsule Take 100-200 mg by mouth 2 (two) times daily as needed (rosacea).    Yes Historical Provider, MD  ferrous sulfate 325 (65 FE) MG tablet Take 1 tablet (325 mg total) by mouth daily with breakfast. 09/05/13  Yes Rande Brunt, NP  furosemide (LASIX) 80 MG tablet Take 80 mg by mouth 2 (two) times daily. 10/03/13  Yes Amy D Clegg, NP  insulin glargine (LANTUS) 100 UNIT/ML injection Inject 70-90 Units into the skin 2 (two) times daily. 63 units in the evening and 63 units at night.   Yes Historical Provider, MD    lisinopril (PRINIVIL,ZESTRIL) 5 MG tablet Take 1 tablet (5 mg total) by mouth daily. 11/27/13  Yes Rande Brunt, NP  metroNIDAZOLE (METROGEL) 0.75 % gel Apply 1 application topically 2 (two) times daily as needed (rosacea).   Yes Historical Provider, MD  mometasone (ELOCON) 0.1 % lotion Apply 1 application topically daily as needed (eczema). EAR ECZEMA   Yes Historical Provider, MD  omeprazole (PRILOSEC) 20 MG capsule Take 20 mg by mouth 2 (two) times daily.   Yes Historical Provider, MD  oxyCODONE-acetaminophen (PERCOCET) 10-325 MG per tablet Take 1 tablet by mouth every 4 (four) hours as needed for pain.   Yes Historical Provider, MD  polyethylene glycol (MIRALAX / GLYCOLAX) packet Take 17 g by mouth daily. 08/25/13  Yes Tarri Fuller, PA-C  rosuvastatin (CRESTOR) 20 MG tablet Take 1 tablet (20 mg total) by mouth daily at 6 PM. 08/25/13  Yes Tarri Fuller, PA-C  venlafaxine (EFFEXOR) 75 MG tablet Take 75 mg by mouth 2 (two) times daily.    Yes Historical Provider, MD    Allergies:  Allergies  Allergen Reactions  . Codeine Nausea And Vomiting  . Tylox [Oxycodone-Acetaminophen]     Vision closing in & heart palpitations.     History   Social History  . Marital Status: Legally Separated    Spouse Name: N/A    Number of Children: N/A  . Years of Education: N/A   Occupational History  . Not on file.   Social History Main Topics  . Smoking status: Never Smoker   . Smokeless tobacco: Never Used  . Alcohol Use: No  . Drug Use: No  . Sexual Activity: Not Currently   Other Topics Concern  . Not on file   Social History Narrative   Lives in [place] with daughter.      Family History  Problem Relation Age of Onset  . Lung cancer Father   . Coronary artery disease Father   . Diabetes Father   . Lung cancer Mother   . Asthma Brother   . Other Grandchild     Grandchild with tetralogy of Fallot and Hirschsprng disease     ROS:  Please see the history of present illness.     All  other systems reviewed and negative.    Physical Exam:   Blood pressure 122/60, pulse 73, height 5\' 6"  (1.676 m), weight 231 lb 9.6 oz (105.053 kg). General: Well developed, well nourished female in no acute distress. Head: Normocephalic, atraumatic, sclera non-icteric, no xanthomas, nares are without discharge. EENT: normal Lymph Nodes:  none Back: without scoliosis/kyphosis, no CVA tendersness Neck: Negative for carotid bruits. JVD not elevated. Lungs: Clear bilaterally to auscultation without wheezes, rales, or rhonchi. Breathing  is unlabored. Heart: Irregularly irregular rhythm with  m normal S1 and S2 and a 2/6urmur , rubs, or gallops appreciated. Abdomen: Soft, non-tender, non-distended with normoactive bowel sounds. No hepatomegaly. No rebound/guarding. No obvious abdominal masses. Msk:  Strength and tone appear normal for age. Extremities: No clubbing or cyanosis. No edema.  Distal pedal pulses are 2+ and equal bilaterally. Skin: Warm and Dry Neuro: Alert and oriented X 3. CN III-XII intact Grossly normal sensory and motor function . Psych:  Responds to questions appropriately with a normal affect.      Labs: Cardiac Enzymes No results found for this basename: CKTOTAL, CKMB, TROPONINI,  in the last 72 hours CBC Lab Results  Component Value Date   WBC 10.4 09/12/2013   HGB 11.1* 09/12/2013   HCT 36.2 09/12/2013   MCV 75.1* 09/12/2013   PLT 307 09/12/2013   PROTIME: No results found for this basename: LABPROT, INR,  in the last 72 hours Chemistry No results found for this basename: NA, K, CL, CO2, BUN, CREATININE, CALCIUM, LABALBU, PROT, BILITOT, ALKPHOS, ALT, AST, GLUCOSE,  in the last 168 hours Lipids No results found for this basename: CHOL, HDL, LDLCALC, TRIG   BNP Pro B Natriuretic peptide (BNP)  Date/Time Value Ref Range Status  11/26/2013  3:31 PM 1593.0* 0 - 125 pg/mL Final  08/22/2013  4:06 AM 1368.0* 0 - 125 pg/mL Final  06/15/2008  4:34 AM 326.0* 0.0 - 100.0 pg/mL  Final  06/14/2008  1:52 AM 240.0* 0.0 - 100.0 pg/mL Final   Miscellaneous No results found for this basename: DDIMER    Radiology/Studies:  No results found.  EKG:  Atrial fibrillation with left bundle branch block and heart rate 70 Intervals-/16/43 Access left -76   Assessment and Plan:  Congestive heart failure-chronic-systolic  Nonischemic cardio myopathy   Atrial fibrillation-permanent  Orthostatic hypotension  Left bundle branch block  The patient has nonischemic cardiomyopathy which has been persistent despite guidelines directed medical therapy. She has congestive heart failure class IIb. She is also significantly limited by back pain and orthostatic intolerance.  Is appropriate at this point to consider CRT D. implantation the reduction risk of sudden death as well as potential benefits associated with resynchronization for heart failure symptoms. Given her atrial fibrillation may well require adjunctive AV junction ablation  We also discussed extensively the impact of orthostatic hypotension and nonpharmacological measures to address this including raising the head of the bed 6 inches, isometric contraction prior to standing, attention to ambient temperature, the use of abdominal binde  Have reviewed the potential benefits and risks of ICD implantation including but not limited to death, perforation of heart or lung, lead dislodgement, infection,  device malfunction and inappropriate shocks.  The patient and familyexpress understanding  and are willing to proceed.      Virl Axe

## 2013-12-06 NOTE — Patient Instructions (Addendum)
Your physician recommends that you continue on your current medications as directed. Please refer to the Current Medication list given to you today.   We will call you with plan of care once Dr. Caryl Comes has spoken with Dr. Ardis Hughs.

## 2013-12-10 ENCOUNTER — Other Ambulatory Visit: Payer: Self-pay

## 2013-12-10 ENCOUNTER — Telehealth: Payer: Self-pay

## 2013-12-10 DIAGNOSIS — R195 Other fecal abnormalities: Secondary | ICD-10-CM

## 2013-12-10 DIAGNOSIS — D509 Iron deficiency anemia, unspecified: Secondary | ICD-10-CM

## 2013-12-10 NOTE — Telephone Encounter (Signed)
FYI:  Dr Ardis Hughs the pt is on Eliquis, I will send a letter to PCP regarding how long to hold.

## 2013-12-10 NOTE — Telephone Encounter (Signed)
Ok, thanks.

## 2013-12-10 NOTE — Telephone Encounter (Signed)
Message copied by Barron Alvine on Mon Dec 10, 2013  7:54 AM ------      Message from: Milus Banister      Created: Fri Dec 07, 2013  8:17 AM       Richardson Landry,      Thanks for the note.  She had a colonoscopy 3-5 years ago elsewhere and told us that it was normal.  However given her ongoing IDA and the fact that colonoscopy can (probably rarely) miss significant lesions we'll get her in for repeat colonoscopy.  We'll get in touch with her to schedule it.            Joseluis Alessio,      She needs colonoscopy ([facility] with MAC, next available EUS type Thursday). For heme + IDA.  Thanks                  ----- Message -----         From: Deboraha Sprang, MD         Sent: 12/06/2013   4:37 PM           To: Milus Banister, MD            Dan, if you could help and by looking at the patient's chart again that would be great. She has been referred for ICD implantation. She has a history of iron deficiency anemia with guaiac positive stool as you know. She had an endoscopy in the hospital that was negative. We need to exclude a lower GI malignancy prior to proceeding. Continue seeing her to arrange for colonoscopy or should we do this by abdominal CT scanning. Thanks. Richardson Landry       ------

## 2013-12-10 NOTE — Telephone Encounter (Signed)
Left message on machine to call back  

## 2013-12-12 NOTE — Telephone Encounter (Signed)
Left message on machine to call back  

## 2013-12-18 ENCOUNTER — Encounter (HOSPITAL_COMMUNITY): Payer: Self-pay | Admitting: *Deleted

## 2013-12-18 ENCOUNTER — Other Ambulatory Visit: Payer: Self-pay

## 2013-12-18 ENCOUNTER — Telehealth: Payer: Self-pay

## 2013-12-18 ENCOUNTER — Telehealth (HOSPITAL_COMMUNITY): Payer: Self-pay | Admitting: Vascular Surgery

## 2013-12-18 MED ORDER — MOVIPREP 100 G PO SOLR: 1.0000 | Freq: Once | ORAL | Status: DC

## 2013-12-18 NOTE — Telephone Encounter (Signed)
Will send to MD for review.

## 2013-12-18 NOTE — Telephone Encounter (Signed)
Letter resent to Dr Quillian Quince regarding Eliquis

## 2013-12-18 NOTE — Telephone Encounter (Signed)
Victoria Adams from Dr. Ardis Hughs office called pt is having a colonoscopy this Thursday.. Pt is on Eliquis and they want to know if its ok to hold the medication for 24 hours.. Please advise

## 2013-12-18 NOTE — Telephone Encounter (Signed)
OK to hold 24 hrs

## 2013-12-18 NOTE — Telephone Encounter (Signed)
I have resent the letter to Dr Jeffie Pollock regarding Eliquis and I have also spoken with the pt, she will hold the eliquis in the morning until we can get a response.  She had no questions regarding the prep or procedure.

## 2013-12-18 NOTE — Telephone Encounter (Signed)
See alternate note  

## 2013-12-18 NOTE — Telephone Encounter (Signed)
Message copied by Barron Alvine on Tue Dec 18, 2013 12:44 PM ------      Message from: Barron Alvine      Created: Mon Dec 10, 2013 10:29 AM       Anti coag response needed ------

## 2013-12-19 ENCOUNTER — Encounter (HOSPITAL_COMMUNITY): Payer: Self-pay | Admitting: Pharmacy Technician

## 2013-12-19 NOTE — Telephone Encounter (Signed)
Ok to hold per Dr Aundra Dubin, message sent to Christian Mate, [place]

## 2013-12-19 NOTE — Telephone Encounter (Signed)
Patient notified and verbalized understanding. 

## 2013-12-19 NOTE — Telephone Encounter (Signed)
Received ok for pt to hold eliquis 24 hours prior to procedure.  Pt is aware and letter to be scanned into EPIC

## 2013-12-20 ENCOUNTER — Encounter (HOSPITAL_COMMUNITY)
Admission: RE | Disposition: A | Discharge status: Home / Self Care | Payer: Self-pay | Source: Ambulatory Visit | Attending: Gastroenterology

## 2013-12-20 ENCOUNTER — Encounter (HOSPITAL_COMMUNITY): Payer: Medicare Other | Admitting: Anesthesiology

## 2013-12-20 ENCOUNTER — Encounter (HOSPITAL_COMMUNITY): Payer: Self-pay | Admitting: Gastroenterology

## 2013-12-20 ENCOUNTER — Ambulatory Visit (HOSPITAL_COMMUNITY): Payer: Medicare Other | Admitting: Anesthesiology

## 2013-12-20 ENCOUNTER — Ambulatory Visit (HOSPITAL_COMMUNITY)
Admission: RE | Admit: 2013-12-20 | Discharge: 2013-12-20 | Disposition: A | Discharge status: Home / Self Care | Payer: Medicare Other | Source: Ambulatory Visit | Attending: Gastroenterology | Admitting: Gastroenterology

## 2013-12-20 DIAGNOSIS — D126 Benign neoplasm of colon, unspecified: Secondary | ICD-10-CM | POA: Diagnosis not present

## 2013-12-20 DIAGNOSIS — R195 Other fecal abnormalities: Secondary | ICD-10-CM

## 2013-12-20 DIAGNOSIS — F329 Major depressive disorder, single episode, unspecified: Secondary | ICD-10-CM | POA: Diagnosis not present

## 2013-12-20 DIAGNOSIS — K648 Other hemorrhoids: Secondary | ICD-10-CM | POA: Insufficient documentation

## 2013-12-20 DIAGNOSIS — I447 Left bundle-branch block, unspecified: Secondary | ICD-10-CM | POA: Insufficient documentation

## 2013-12-20 DIAGNOSIS — E119 Type 2 diabetes mellitus without complications: Secondary | ICD-10-CM | POA: Diagnosis not present

## 2013-12-20 DIAGNOSIS — K7689 Other specified diseases of liver: Secondary | ICD-10-CM | POA: Diagnosis not present

## 2013-12-20 DIAGNOSIS — I5022 Chronic systolic (congestive) heart failure: Secondary | ICD-10-CM | POA: Insufficient documentation

## 2013-12-20 DIAGNOSIS — Z1211 Encounter for screening for malignant neoplasm of colon: Secondary | ICD-10-CM | POA: Diagnosis not present

## 2013-12-20 DIAGNOSIS — M899 Disorder of bone, unspecified: Secondary | ICD-10-CM | POA: Insufficient documentation

## 2013-12-20 DIAGNOSIS — I129 Hypertensive chronic kidney disease with stage 1 through stage 4 chronic kidney disease, or unspecified chronic kidney disease: Secondary | ICD-10-CM | POA: Insufficient documentation

## 2013-12-20 DIAGNOSIS — I951 Orthostatic hypotension: Secondary | ICD-10-CM | POA: Diagnosis not present

## 2013-12-20 DIAGNOSIS — Z90710 Acquired absence of both cervix and uterus: Secondary | ICD-10-CM | POA: Diagnosis not present

## 2013-12-20 DIAGNOSIS — I252 Old myocardial infarction: Secondary | ICD-10-CM | POA: Diagnosis not present

## 2013-12-20 DIAGNOSIS — Z794 Long term (current) use of insulin: Secondary | ICD-10-CM | POA: Insufficient documentation

## 2013-12-20 DIAGNOSIS — F3289 Other specified depressive episodes: Secondary | ICD-10-CM | POA: Diagnosis not present

## 2013-12-20 DIAGNOSIS — Z79899 Other long term (current) drug therapy: Secondary | ICD-10-CM | POA: Diagnosis not present

## 2013-12-20 DIAGNOSIS — I4891 Unspecified atrial fibrillation: Secondary | ICD-10-CM | POA: Insufficient documentation

## 2013-12-20 DIAGNOSIS — D128 Benign neoplasm of rectum: Secondary | ICD-10-CM | POA: Insufficient documentation

## 2013-12-20 DIAGNOSIS — M949 Disorder of cartilage, unspecified: Secondary | ICD-10-CM

## 2013-12-20 DIAGNOSIS — I428 Other cardiomyopathies: Secondary | ICD-10-CM | POA: Diagnosis not present

## 2013-12-20 DIAGNOSIS — E669 Obesity, unspecified: Secondary | ICD-10-CM | POA: Diagnosis not present

## 2013-12-20 DIAGNOSIS — D129 Benign neoplasm of anus and anal canal: Secondary | ICD-10-CM

## 2013-12-20 DIAGNOSIS — N189 Chronic kidney disease, unspecified: Secondary | ICD-10-CM | POA: Insufficient documentation

## 2013-12-20 DIAGNOSIS — I059 Rheumatic mitral valve disease, unspecified: Secondary | ICD-10-CM | POA: Diagnosis not present

## 2013-12-20 DIAGNOSIS — M5137 Other intervertebral disc degeneration, lumbosacral region: Secondary | ICD-10-CM | POA: Diagnosis not present

## 2013-12-20 DIAGNOSIS — I2510 Atherosclerotic heart disease of native coronary artery without angina pectoris: Secondary | ICD-10-CM | POA: Diagnosis not present

## 2013-12-20 DIAGNOSIS — I509 Heart failure, unspecified: Secondary | ICD-10-CM | POA: Insufficient documentation

## 2013-12-20 DIAGNOSIS — E559 Vitamin D deficiency, unspecified: Secondary | ICD-10-CM | POA: Diagnosis not present

## 2013-12-20 DIAGNOSIS — M51379 Other intervertebral disc degeneration, lumbosacral region without mention of lumbar back pain or lower extremity pain: Secondary | ICD-10-CM | POA: Diagnosis not present

## 2013-12-20 DIAGNOSIS — L719 Rosacea, unspecified: Secondary | ICD-10-CM | POA: Insufficient documentation

## 2013-12-20 DIAGNOSIS — Z9089 Acquired absence of other organs: Secondary | ICD-10-CM | POA: Insufficient documentation

## 2013-12-20 DIAGNOSIS — D509 Iron deficiency anemia, unspecified: Secondary | ICD-10-CM | POA: Diagnosis not present

## 2013-12-20 DIAGNOSIS — K644 Residual hemorrhoidal skin tags: Secondary | ICD-10-CM | POA: Diagnosis not present

## 2013-12-20 HISTORY — DX: Other specified postprocedural states: Z98.890

## 2013-12-20 HISTORY — DX: Nausea with vomiting, unspecified: R11.2

## 2013-12-20 HISTORY — PX: COLONOSCOPY WITH PROPOFOL: SHX5780

## 2013-12-20 LAB — GLUCOSE, CAPILLARY
GLUCOSE-CAPILLARY: 54 mg/dL — AB (ref 70–99)
GLUCOSE-CAPILLARY: 82 mg/dL (ref 70–99)

## 2013-12-20 SURGERY — COLONOSCOPY WITH PROPOFOL
Anesthesia: Monitor Anesthesia Care

## 2013-12-20 MED ORDER — PROPOFOL 10 MG/ML IV BOLUS
INTRAVENOUS | Status: AC
  Filled 2013-12-20: qty 20

## 2013-12-20 MED ORDER — LABETALOL HCL 5 MG/ML IV SOLN
5.0000 mg | Freq: Once | INTRAVENOUS | Status: AC
  Administered 2013-12-20: 5 mg via INTRAVENOUS

## 2013-12-20 MED ORDER — ONDANSETRON HCL 4 MG/2ML IJ SOLN
INTRAMUSCULAR | Status: AC
  Filled 2013-12-20: qty 2

## 2013-12-20 MED ORDER — PHENYLEPHRINE 40 MCG/ML (10ML) SYRINGE FOR IV PUSH (FOR BLOOD PRESSURE SUPPORT)
PREFILLED_SYRINGE | INTRAVENOUS | Status: AC
  Filled 2013-12-20: qty 10

## 2013-12-20 MED ORDER — PROPOFOL INFUSION 10 MG/ML OPTIME
INTRAVENOUS | Status: DC | PRN
Start: 2013-12-20 — End: 2013-12-20
  Administered 2013-12-20: 25 ug/kg/min via INTRAVENOUS

## 2013-12-20 MED ORDER — PHENYLEPHRINE HCL 10 MG/ML IJ SOLN
INTRAMUSCULAR | Status: DC | PRN
  Administered 2013-12-20: 80 ug via INTRAVENOUS
  Administered 2013-12-20: 40 ug via INTRAVENOUS
  Administered 2013-12-20: 160 ug via INTRAVENOUS
  Administered 2013-12-20: 120 ug via INTRAVENOUS

## 2013-12-20 MED ORDER — DEXTROSE 50 % IV SOLN
50.0000 mL | Freq: Once | INTRAVENOUS | Status: AC
  Administered 2013-12-20: 25 mL via INTRAVENOUS
  Filled 2013-12-20: qty 50

## 2013-12-20 MED ORDER — EPINEPHRINE HCL 0.1 MG/ML IJ SOSY
PREFILLED_SYRINGE | INTRAMUSCULAR | Status: DC | PRN
  Administered 2013-12-20: 5 ug via INTRAVENOUS

## 2013-12-20 MED ORDER — SODIUM CHLORIDE 0.9 % IJ SOLN
INTRAMUSCULAR | Status: AC
  Filled 2013-12-20: qty 20

## 2013-12-20 MED ORDER — ONDANSETRON HCL 4 MG/2ML IJ SOLN
INTRAMUSCULAR | Status: DC | PRN
  Administered 2013-12-20: 2 mg via INTRAVENOUS

## 2013-12-20 MED ORDER — MIDAZOLAM HCL 2 MG/2ML IJ SOLN
INTRAMUSCULAR | Status: AC
  Filled 2013-12-20: qty 2

## 2013-12-20 MED ORDER — LABETALOL HCL 5 MG/ML IV SOLN
INTRAVENOUS | Status: AC
  Filled 2013-12-20: qty 4

## 2013-12-20 MED ORDER — EPINEPHRINE HCL 1 MG/ML IJ SOLN
INTRAMUSCULAR | Status: AC
  Filled 2013-12-20: qty 1

## 2013-12-20 MED ORDER — MIDAZOLAM HCL 5 MG/5ML IJ SOLN
INTRAMUSCULAR | Status: DC | PRN
  Administered 2013-12-20 (×2): 1 mg via INTRAVENOUS

## 2013-12-20 MED ORDER — LACTATED RINGERS IV SOLN
INTRAVENOUS | Status: DC
  Administered 2013-12-20: 08:00:00 via INTRAVENOUS

## 2013-12-20 MED ORDER — SODIUM CHLORIDE 0.9 % IV SOLN: INTRAVENOUS | Status: DC

## 2013-12-20 MED ORDER — PROMETHAZINE HCL 25 MG/ML IJ SOLN
6.2500 mg | INTRAMUSCULAR | Status: DC | PRN
Start: 2013-12-20 — End: 2013-12-20

## 2013-12-20 SURGICAL SUPPLY — 22 items

## 2013-12-20 NOTE — Interval H&P Note (Signed)
History and Physical Interval Note:  12/20/2013 8:08 AM  Victoria Adams  has presented today for surgery, with the diagnosis of Heme positive stool [792.1] IDA (iron deficiency anemia) [280.9]  The various methods of treatment have been discussed with the patient and family. After consideration of risks, benefits and other options for treatment, the patient has consented to  Procedure(s): COLONOSCOPY WITH PROPOFOL (N/A) as a surgical intervention .  The patient's history has been reviewed, patient examined, no change in status, stable for surgery.  I have reviewed the patient's chart and labs.  Questions were answered to the patient's satisfaction.     Milus Banister

## 2013-12-20 NOTE — Op Note (Signed)
[facility] [place], [id number]   COLONOSCOPY PROCEDURE REPORT  PATIENT: Victoria Adams, Victoria Adams  MR#: [id number] BIRTHDATE: 10/14/1940 , 73  yrs. old GENDER: Female ENDOSCOPIST: Milus Banister, MD REFERRED BY: PROCEDURE DATE:  12/20/2013 PROCEDURE:   Colonoscopy with snare polypectomy First Screening Colonoscopy - Avg.  risk and is 50 yrs.  old or older - No.  Prior Negative Screening - Now for repeat screening. N/A  History of Adenoma - Now for follow-up colonoscopy & has been > or = to 3 yrs.  N/A  Polyps Removed Today? Yes. ASA CLASS:   Class IV INDICATIONS:heme positive anemia. MEDICATIONS: MAC sedation, administered by CRNA  DESCRIPTION OF PROCEDURE:   After the risks benefits and alternatives of the procedure were thoroughly explained, informed consent was obtained.  A digital rectal exam revealed no abnormalities of the rectum.   The Pentax Colonoscope [id number] endoscope was introduced through the anus and advanced to the cecum, which was identified by both the appendix and ileocecal valve. No adverse events experienced.   The quality of the prep was fair.  The instrument was then slowly withdrawn as the colon was fully examined.  COLON FINDINGS: Three polyps were found, removed and sent to pathology.  One was 1cm, heaped up, located in descending segment, removed with snare, cautery, path jar 1.  One was 1.5cm, pedunculated, located in sigmoid segment, removed with snare, cautery, path jar 2.  One was 2.5cm, pedunculated, located in proximal rectum, removed with snare/cautery, path jar 3.  There were internal and external hemorrhoids.  The prep was fair, subcentimeter lesions may not have been visible.  Retroflexed views revealed no abnormalities. The time to cecum=8 minutes 00 seconds. Withdrawal time=25 minutes 00 seconds.  The scope was withdrawn and the procedure completed. COMPLICATIONS: There were no complications.  ENDOSCOPIC  IMPRESSION: Three polyps were found, removed and sent to pathology. There were internal and external hemorrhoids. The prep was fair, subcentimeter lesions may not have been visible.  RECOMMENDATIONS: 1. If the polyp(s) removed today are proven to be adenomatous (pre-cancerous) polyps, you will need a colonoscopy in 3 years. You will receive a letter within 1-2 weeks with the results of your biopsy as well as final recommendations.  Please call my office if you have not received a letter after 3 weeks. 2. OK to resume blood thinners in 3 days.   eSigned:  Milus Banister, MD 12/20/2013 10:19 AM   cc:  Virl Axe, MD   PATIENT NAME:  Victoria Adams, Victoria Adams MR#: [id number]

## 2013-12-20 NOTE — Discharge Instructions (Signed)
You can resume your blood thinner in 3 days.  YOU HAD AN ENDOSCOPIC PROCEDURE TODAY: Refer to the procedure report that was given to you for any specific questions about what was found during the examination.  If the procedure report does not answer your questions, please call your gastroenterologist to clarify.  YOU SHOULD EXPECT: Some feelings of bloating in the abdomen. Passage of more gas than usual.  Walking can help get rid of the air that was put into your GI tract during the procedure and reduce the bloating. If you had a lower endoscopy (such as a colonoscopy or flexible sigmoidoscopy) you may notice spotting of blood in your stool or on the toilet paper.   DIET: Your first meal following the procedure should be a light meal and then it is ok to progress to your normal diet.  A half-sandwich or bowl of soup is an example of a good first meal.  Heavy or fried foods are harder to digest and may make you feel nasueas or bloated.  Drink plenty of fluids but you should avoid alcoholic beverages for 24 hours.  ACTIVITY: Your care partner should take you home directly after the procedure.  You should plan to take it easy, moving slowly for the rest of the day.  You can resume normal activity the day after the procedure however you should NOT DRIVE or use heavy machinery for 24 hours (because of the sedation medicines used during the test).    SYMPTOMS TO REPORT IMMEDIATELY  A gastroenterologist can be reached at any hour.  Please call your doctor's office for any of the following symptoms:   Following lower endoscopy (colonoscopy, flexible sigmoidoscopy)  Excessive amounts of blood in the stool  Significant tenderness, worsening of abdominal pains  Swelling of the abdomen that is new, acute  Fever of 100 or higher  Following upper endoscopy (EGD, EUS, ERCP)  Vomiting of blood or coffee ground material  New, significant abdominal pain  New, significant chest pain or pain under the shoulder  blades  Painful or persistently difficult swallowing  New shortness of breath  Black, tarry-looking stools  FOLLOW UP: If any biopsies were taken you will be contacted by phone or by letter within the next 1-3 weeks.  Call your gastroenterologist if you have not heard about the biopsies in 3 weeks.  Please also call your gastroenterologist's office with any specific questions about appointments or follow up tests.

## 2013-12-20 NOTE — Transfer of Care (Signed)
Immediate Anesthesia Transfer of Care Note  Patient: Victoria Adams  Procedure(s) Performed: Procedure(s): COLONOSCOPY WITH PROPOFOL (N/A)  Patient Location: [facility]  Anesthesia Type:MAC  Level of Consciousness: Patient easily awoken, sedated, comfortable, cooperative, following commands, responds to stimulation.   Airway & Oxygen Therapy: Patient spontaneously breathing, ventilating well, oxygen via simple oxygen mask.  Post-op Assessment: Report given to PACU RN, vital signs reviewed and stable, moving all extremities.   Post vital signs: Reviewed and stable.  Complications: No apparent anesthesia complications

## 2013-12-20 NOTE — H&P (View-Only) (Signed)
ELECTROPHYSIOLOGY CONSULT NOTE  Patient ID: Victoria Adams, MRN: [id number], DOB/AGE: Nov 18, 1940 73 y.o. Admit date: (Not on file) Date of Consult: 12/06/2013  Primary Physician: Gar Ponto, MD Primary Cardiologist: SM  Chief Complaint: ICD   HPI Victoria Adams is a 73 y.o. female  Seen for consideration of ICD implantation with consideration of AV junction ablation  She is a history of a nonischemic cardiomyopathy with an ejection fraction 5/15-20% with severe biatrial left greater than right enlargement (55/2.5). Catheterization 5/15 demonstrated moderate LAD disease with 40-60% with a 9 0% septal perforator.   She is hospitalized for this 5/15 and was treated transiently with milrinone. For Her heart failure has been further complicated by iron deficiency anemia.  With guaiac positive stool. Outpatient GI evaluation with anticipated. Ejection fraction 7/14 demonstrated 40-45%.  She is also known to have atrial fibrillation which is permanent and left bundle branch block.    Past Medical History  Diagnosis Date  . Obesity   . Chronic low back pain     Secondary to DJD  . Vitamin D deficiency   . Fatty liver disease, nonalcoholic   . Type 2 diabetes mellitus   . Allergic rhinitis   . Essential hypertension, benign   . Chronic systolic heart failure     a. echo (5/15):  mod LVH, EF 20-25%, diff HK, severe LAE, mild RAE  . GERD (gastroesophageal reflux disease)   . Coronary atherosclerosis of native coronary artery     LHC (5/15):  EF 40-45% global HK; long LAD 40-60%, ostial 1st major septal perf 80-90%, ostial CFX ?%, mid AVCFX extensive Ca2+, dist PDA 50%  . Osteopenia   . Depression   . Rosacea   . Mitral regurgitation   . NICM (nonischemic cardiomyopathy)     a. echo (5/15):  mod LVH, EF 20-25%, diff HK, severe LAE, mild RAE  . Atrial fibrillation   . NSTEMI (non-ST elevated myocardial infarction)   . 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 Iron deficiency anemia    . CKD (chronic kidney disease)       Surgical History:  Past Surgical History  Procedure Laterality Date  . Total abdominal hysterectomy w/ bilateral salpingoophorectomy      for dermoid tumor  . Appendectomy    . Cataract extraction    . Cholecystectomy    . Tonsillectomy    . Rotator cuff repair      RIGHT SHOULDER  . Breast biopsy      RIGHT  . Abdominal hysterectomy    . Cardiac catheterization  2010    LAD 50%, CFX 30%, RCA 40%; EF by echo 25-35%  . Esophagogastroduodenoscopy N/A 08/25/2013    Procedure: ESOPHAGOGASTRODUODENOSCOPY (EGD);  Surgeon: Milus Banister, MD;  Location: [facility];  Service: Endoscopy;  Laterality: N/A;     Home Meds: Prior to Admission medications   Medication Sig Start Date End Date Taking? Authorizing Provider  apixaban (ELIQUIS) 5 MG TABS tablet Take 1 tablet (5 mg total) by mouth 2 (two) times daily. 09/05/13  Yes Rande Brunt, NP  beta carotene w/minerals (OCUVITE) tablet Take 2 tablets by mouth daily.   Yes Historical Provider, MD  Calcium Carbonate-Vitamin D (CALCIUM + D PO) Take 1 tablet by mouth 2 (two) times daily.    Yes Historical Provider, MD  carvedilol (COREG) 6.25 MG tablet Take 6.25 mg (1 tablet) in the morning and 9.375 mg (1 1/2 tablets) in the  evening. 11/26/13  Yes Rande Brunt, NP  digoxin (LANOXIN) 0.125 MG tablet Take 125 mcg by mouth daily.   Yes Historical Provider, MD  doxycycline (VIBRAMYCIN) 100 MG capsule Take 100-200 mg by mouth 2 (two) times daily as needed (rosacea).    Yes Historical Provider, MD  ferrous sulfate 325 (65 FE) MG tablet Take 1 tablet (325 mg total) by mouth daily with breakfast. 09/05/13  Yes Rande Brunt, NP  furosemide (LASIX) 80 MG tablet Take 80 mg by mouth 2 (two) times daily. 10/03/13  Yes Amy D Clegg, NP  insulin glargine (LANTUS) 100 UNIT/ML injection Inject 70-90 Units into the skin 2 (two) times daily. 63 units in the evening and 63 units at night.   Yes Historical Provider, MD    lisinopril (PRINIVIL,ZESTRIL) 5 MG tablet Take 1 tablet (5 mg total) by mouth daily. 11/27/13  Yes Rande Brunt, NP  metroNIDAZOLE (METROGEL) 0.75 % gel Apply 1 application topically 2 (two) times daily as needed (rosacea).   Yes Historical Provider, MD  mometasone (ELOCON) 0.1 % lotion Apply 1 application topically daily as needed (eczema). EAR ECZEMA   Yes Historical Provider, MD  omeprazole (PRILOSEC) 20 MG capsule Take 20 mg by mouth 2 (two) times daily.   Yes Historical Provider, MD  oxyCODONE-acetaminophen (PERCOCET) 10-325 MG per tablet Take 1 tablet by mouth every 4 (four) hours as needed for pain.   Yes Historical Provider, MD  polyethylene glycol (MIRALAX / GLYCOLAX) packet Take 17 g by mouth daily. 08/25/13  Yes Tarri Fuller, PA-C  rosuvastatin (CRESTOR) 20 MG tablet Take 1 tablet (20 mg total) by mouth daily at 6 PM. 08/25/13  Yes Tarri Fuller, PA-C  venlafaxine (EFFEXOR) 75 MG tablet Take 75 mg by mouth 2 (two) times daily.    Yes Historical Provider, MD    Allergies:  Allergies  Allergen Reactions  . Codeine Nausea And Vomiting  . Tylox [Oxycodone-Acetaminophen]     Vision closing in & heart palpitations.     History   Social History  . Marital Status: Legally Separated    Spouse Name: N/A    Number of Children: N/A  . Years of Education: N/A   Occupational History  . Not on file.   Social History Main Topics  . Smoking status: Never Smoker   . Smokeless tobacco: Never Used  . Alcohol Use: No  . Drug Use: No  . Sexual Activity: Not Currently   Other Topics Concern  . Not on file   Social History Narrative   Lives in [place] with daughter.      Family History  Problem Relation Age of Onset  . Lung cancer Father   . Coronary artery disease Father   . Diabetes Father   . Lung cancer Mother   . Asthma Brother   . Other Grandchild     Grandchild with tetralogy of Fallot and Hirschsprng disease     ROS:  Please see the history of present illness.     All  other systems reviewed and negative.    Physical Exam:   Blood pressure 122/60, pulse 73, height 5\' 6"  (1.676 m), weight 231 lb 9.6 oz (105.053 kg). General: Well developed, well nourished female in no acute distress. Head: Normocephalic, atraumatic, sclera non-icteric, no xanthomas, nares are without discharge. EENT: normal Lymph Nodes:  none Back: without scoliosis/kyphosis, no CVA tendersness Neck: Negative for carotid bruits. JVD not elevated. Lungs: Clear bilaterally to auscultation without wheezes, rales, or rhonchi. Breathing  is unlabored. Heart: Irregularly irregular rhythm with  m normal S1 and S2 and a 2/6urmur , rubs, or gallops appreciated. Abdomen: Soft, non-tender, non-distended with normoactive bowel sounds. No hepatomegaly. No rebound/guarding. No obvious abdominal masses. Msk:  Strength and tone appear normal for age. Extremities: No clubbing or cyanosis. No edema.  Distal pedal pulses are 2+ and equal bilaterally. Skin: Warm and Dry Neuro: Alert and oriented X 3. CN III-XII intact Grossly normal sensory and motor function . Psych:  Responds to questions appropriately with a normal affect.      Labs: Cardiac Enzymes No results found for this basename: CKTOTAL, CKMB, TROPONINI,  in the last 72 hours CBC Lab Results  Component Value Date   WBC 10.4 09/12/2013   HGB 11.1* 09/12/2013   HCT 36.2 09/12/2013   MCV 75.1* 09/12/2013   PLT 307 09/12/2013   PROTIME: No results found for this basename: LABPROT, INR,  in the last 72 hours Chemistry No results found for this basename: NA, K, CL, CO2, BUN, CREATININE, CALCIUM, LABALBU, PROT, BILITOT, ALKPHOS, ALT, AST, GLUCOSE,  in the last 168 hours Lipids No results found for this basename: CHOL, HDL, LDLCALC, TRIG   BNP Pro B Natriuretic peptide (BNP)  Date/Time Value Ref Range Status  11/26/2013  3:31 PM 1593.0* 0 - 125 pg/mL Final  08/22/2013  4:06 AM 1368.0* 0 - 125 pg/mL Final  06/15/2008  4:34 AM 326.0* 0.0 - 100.0 pg/mL  Final  06/14/2008  1:52 AM 240.0* 0.0 - 100.0 pg/mL Final   Miscellaneous No results found for this basename: DDIMER    Radiology/Studies:  No results found.  EKG:  Atrial fibrillation with left bundle branch block and heart rate 70 Intervals-/16/43 Access left -76   Assessment and Plan:  Congestive heart failure-chronic-systolic  Nonischemic cardio myopathy   Atrial fibrillation-permanent  Orthostatic hypotension  Left bundle branch block  The patient has nonischemic cardiomyopathy which has been persistent despite guidelines directed medical therapy. She has congestive heart failure class IIb. She is also significantly limited by back pain and orthostatic intolerance.  Is appropriate at this point to consider CRT D. implantation the reduction risk of sudden death as well as potential benefits associated with resynchronization for heart failure symptoms. Given her atrial fibrillation may well require adjunctive AV junction ablation  We also discussed extensively the impact of orthostatic hypotension and nonpharmacological measures to address this including raising the head of the bed 6 inches, isometric contraction prior to standing, attention to ambient temperature, the use of abdominal binde  Have reviewed the potential benefits and risks of ICD implantation including but not limited to death, perforation of heart or lung, lead dislodgement, infection,  device malfunction and inappropriate shocks.  The patient and familyexpress understanding  and are willing to proceed.      Virl Axe

## 2013-12-20 NOTE — Anesthesia Postprocedure Evaluation (Signed)
  Anesthesia Post-op Note  Patient: Victoria Adams  Procedure(s) Performed: Procedure(s) (LRB): COLONOSCOPY WITH PROPOFOL (N/A)  Patient Location: PACU  Anesthesia Type: MAC  Level of Consciousness: awake and alert   Airway and Oxygen Therapy: Patient Spontanous Breathing  Post-op Pain: mild  Post-op Assessment: Post-op Vital signs reviewed, Patient's Cardiovascular Status Stable, Respiratory Function Stable, Patent Airway and No signs of Nausea or vomiting  Last Vitals:  Filed Vitals:   12/20/13 1021  BP: 150/107  Pulse: 70  Temp: 36.7 C  Resp: 12    Post-op Vital Signs: stable   Complications: No apparent anesthesia complications

## 2013-12-20 NOTE — Anesthesia Preprocedure Evaluation (Addendum)
Anesthesia Evaluation  Patient identified by MRN, date of birth, ID band Patient awake  General Assessment Comment:nonischemic cardiomyopathy with an ejection fraction 5/15-20% with severe biatrial left greater than right enlargement (55/2.5). Catheterization 5/15 demonstrated moderate LAD disease with 40-60% with a 9 0% septal perforator.    She is hospitalized for this 5/15 and was treated transiently with milrinone. For Her heart failure has been further complicated by iron deficiency anemia.  With guaiac positive stool. Outpatient GI evaluation with anticipated. Ejection fraction 7/14 demonstrated 40-45%.   Reviewed: Allergy & Precautions, H&P , NPO status , Patient's Chart, lab work & pertinent test results  History of Anesthesia Complications (+) PONV and history of anesthetic complications  Airway Mallampati: III TM Distance: >3 FB Neck ROM: Full    Dental no notable dental hx. (+) Partial Upper   Pulmonary neg pulmonary ROS,  breath sounds clear to auscultation  - decreased breath sounds- wheezing  - rales    Cardiovascular Exercise Tolerance: Poor hypertension, + CAD, + Past MI and +CHF + dysrhythmias Atrial Fibrillation Rhythm:Irregular Rate:Abnormal  Most recent TTE demonstrated an EF of 20% in 5/15, a BNP was 1368 and she is scheduled for ICD placement. Per family, the plan is for this procedure after the colonoscopy. Daughter and patient report that she feels much better than her hospitalization in March. She reports that her peripheral edema is decreased and she denies any significant SOB with day to day activities. She denies any chest pain. She reports that this is the best she has felt in a long time. She did not take her beta blocker this morning. I will give her labetalol this am.    Neuro/Psych PSYCHIATRIC DISORDERS Anxiety Depression negative neurological ROS     GI/Hepatic Neg liver ROS, GERD-  Medicated and  Controlled,  Endo/Other  negative endocrine ROSdiabetes  Renal/GU negative Renal ROS  negative genitourinary   Musculoskeletal  (+) Arthritis -, Osteoarthritis,    Abdominal Normal abdominal exam  (+)   Peds negative pediatric ROS (+)  Hematology  (+) anemia ,   Anesthesia Other Findings 1+ pitting edema peripherally in bilateral lower extremities  Reproductive/Obstetrics negative OB ROS                         Anesthesia Physical Anesthesia Plan  ASA: IV  Anesthesia Plan: MAC   Post-op Pain Management:    Induction: Intravenous  Airway Management Planned: Nasal Cannula  Additional Equipment:   Intra-op Plan:   Post-operative Plan:   Informed Consent: I have reviewed the patients History and Physical, chart, labs and discussed the procedure including the risks, benefits and alternatives for the proposed anesthesia with the patient or authorized representative who has indicated his/her understanding and acceptance.   Dental advisory given  Plan Discussed with: CRNA  Anesthesia Plan Comments:        Anesthesia Quick Evaluation

## 2013-12-21 ENCOUNTER — Encounter (HOSPITAL_COMMUNITY): Payer: Self-pay | Admitting: Gastroenterology

## 2014-01-04 ENCOUNTER — Telehealth: Payer: Self-pay | Admitting: *Deleted

## 2014-01-04 NOTE — Telephone Encounter (Signed)
lmtcb to discuss CRT-D implant.

## 2014-01-09 ENCOUNTER — Other Ambulatory Visit (HOSPITAL_COMMUNITY): Payer: Self-pay

## 2014-01-09 MED ORDER — FERROUS SULFATE 325 (65 FE) MG PO TABS: 325.0000 mg | ORAL_TABLET | Freq: Every day | ORAL | Status: DC

## 2014-01-10 ENCOUNTER — Encounter (HOSPITAL_COMMUNITY): Payer: Self-pay

## 2014-01-10 ENCOUNTER — Ambulatory Visit (HOSPITAL_COMMUNITY)
Admission: RE | Admit: 2014-01-10 | Discharge: 2014-01-10 | Disposition: A | Discharge status: Home / Self Care | Payer: Medicare Other | Source: Ambulatory Visit | Attending: Internal Medicine | Admitting: Internal Medicine

## 2014-01-10 VITALS — BP 132/74 | HR 86 | Wt 231.0 lb

## 2014-01-10 DIAGNOSIS — Z794 Long term (current) use of insulin: Secondary | ICD-10-CM | POA: Diagnosis not present

## 2014-01-10 DIAGNOSIS — R42 Dizziness and giddiness: Secondary | ICD-10-CM | POA: Insufficient documentation

## 2014-01-10 DIAGNOSIS — K219 Gastro-esophageal reflux disease without esophagitis: Secondary | ICD-10-CM | POA: Insufficient documentation

## 2014-01-10 DIAGNOSIS — I4891 Unspecified atrial fibrillation: Secondary | ICD-10-CM | POA: Diagnosis not present

## 2014-01-10 DIAGNOSIS — I5022 Chronic systolic (congestive) heart failure: Secondary | ICD-10-CM | POA: Diagnosis not present

## 2014-01-10 DIAGNOSIS — Z8249 Family history of ischemic heart disease and other diseases of the circulatory system: Secondary | ICD-10-CM | POA: Insufficient documentation

## 2014-01-10 DIAGNOSIS — E875 Hyperkalemia: Secondary | ICD-10-CM | POA: Insufficient documentation

## 2014-01-10 DIAGNOSIS — M47897 Other spondylosis, lumbosacral region: Secondary | ICD-10-CM | POA: Diagnosis not present

## 2014-01-10 DIAGNOSIS — Z86010 Personal history of colonic polyps: Secondary | ICD-10-CM | POA: Diagnosis not present

## 2014-01-10 DIAGNOSIS — I129 Hypertensive chronic kidney disease with stage 1 through stage 4 chronic kidney disease, or unspecified chronic kidney disease: Secondary | ICD-10-CM | POA: Diagnosis not present

## 2014-01-10 DIAGNOSIS — I252 Old myocardial infarction: Secondary | ICD-10-CM | POA: Insufficient documentation

## 2014-01-10 DIAGNOSIS — E669 Obesity, unspecified: Secondary | ICD-10-CM | POA: Insufficient documentation

## 2014-01-10 DIAGNOSIS — M8580 Other specified disorders of bone density and structure, unspecified site: Secondary | ICD-10-CM | POA: Diagnosis not present

## 2014-01-10 DIAGNOSIS — Z7901 Long term (current) use of anticoagulants: Secondary | ICD-10-CM | POA: Insufficient documentation

## 2014-01-10 DIAGNOSIS — Z79899 Other long term (current) drug therapy: Secondary | ICD-10-CM | POA: Insufficient documentation

## 2014-01-10 DIAGNOSIS — N183 Chronic kidney disease, stage 3 (moderate): Secondary | ICD-10-CM | POA: Diagnosis not present

## 2014-01-10 DIAGNOSIS — K760 Fatty (change of) liver, not elsewhere classified: Secondary | ICD-10-CM | POA: Diagnosis not present

## 2014-01-10 DIAGNOSIS — I340 Nonrheumatic mitral (valve) insufficiency: Secondary | ICD-10-CM | POA: Diagnosis not present

## 2014-01-10 DIAGNOSIS — Z833 Family history of diabetes mellitus: Secondary | ICD-10-CM | POA: Insufficient documentation

## 2014-01-10 DIAGNOSIS — I428 Other cardiomyopathies: Secondary | ICD-10-CM | POA: Insufficient documentation

## 2014-01-10 DIAGNOSIS — J309 Allergic rhinitis, unspecified: Secondary | ICD-10-CM | POA: Diagnosis not present

## 2014-01-10 DIAGNOSIS — E119 Type 2 diabetes mellitus without complications: Secondary | ICD-10-CM | POA: Insufficient documentation

## 2014-01-10 DIAGNOSIS — F329 Major depressive disorder, single episode, unspecified: Secondary | ICD-10-CM | POA: Diagnosis not present

## 2014-01-10 DIAGNOSIS — L719 Rosacea, unspecified: Secondary | ICD-10-CM | POA: Diagnosis not present

## 2014-01-10 DIAGNOSIS — I2510 Atherosclerotic heart disease of native coronary artery without angina pectoris: Secondary | ICD-10-CM | POA: Insufficient documentation

## 2014-01-10 DIAGNOSIS — D509 Iron deficiency anemia, unspecified: Secondary | ICD-10-CM | POA: Insufficient documentation

## 2014-01-10 DIAGNOSIS — E559 Vitamin D deficiency, unspecified: Secondary | ICD-10-CM | POA: Diagnosis not present

## 2014-01-10 LAB — BASIC METABOLIC PANEL
ANION GAP: 15 (ref 5–15)
BUN: 18 mg/dL (ref 6–23)
CALCIUM: 9.3 mg/dL (ref 8.4–10.5)
CO2: 28 mEq/L (ref 19–32)
Chloride: 100 mEq/L (ref 96–112)
Creatinine, Ser: 1.07 mg/dL (ref 0.50–1.10)
GFR calc non Af Amer: 51 mL/min — ABNORMAL LOW (ref >90)
GFR, EST AFRICAN AMERICAN: 59 mL/min — AB (ref >90)
Glucose, Bld: 177 mg/dL — ABNORMAL HIGH (ref 70–99)
Potassium: 4.2 mEq/L (ref 3.7–5.3)
SODIUM: 143 meq/L (ref 137–147)

## 2014-01-10 LAB — URINE MICROSCOPIC-ADD ON

## 2014-01-10 LAB — URINALYSIS, ROUTINE W REFLEX MICROSCOPIC
Bilirubin Urine: NEGATIVE
Glucose, UA: NEGATIVE mg/dL
Hgb urine dipstick: NEGATIVE
KETONES UR: NEGATIVE mg/dL
LEUKOCYTES UA: NEGATIVE
Nitrite: NEGATIVE
PROTEIN: 100 mg/dL — AB
Specific Gravity, Urine: 1.015 (ref 1.005–1.030)
UROBILINOGEN UA: 1 mg/dL (ref 0.0–1.0)
pH: 5 (ref 5.0–8.0)

## 2014-01-10 MED ORDER — CARVEDILOL 6.25 MG PO TABS: 9.3750 mg | ORAL_TABLET | Freq: Two times a day (BID) | ORAL | Status: DC

## 2014-01-10 NOTE — Patient Instructions (Signed)
Follow up in 6 weeks  Take carvedilol 9.375 mg twice a day (this is 1 1/2 tablets twice a day)   Do the following things EVERYDAY: 1) Weigh yourself in the morning before breakfast. Write it down and keep it in a log. 2) Take your medicines as prescribed 3) Eat low salt foods-Limit salt (sodium) to 2000 mg per day.  4) Stay as active as you can everyday 5) Limit all fluids for the day to less than 2 liters

## 2014-01-10 NOTE — Progress Notes (Signed)
Patient ID: Victoria Adams, female   DOB: 1941/03/20, 73 y.o.   MRN: [id number]  PCP: Dr. Gar Ponto ([facility] in [place]) Primary Cardiologist: Dr. Domenic Polite EP: Dr Caryl Comes   HPI: Ms. Betterton is a 73 yo female with a history of chronic anemia, HTN, chronic A-Fib, NICM, DM2, CAD, chronic back pain and chronic systolic HF.   Admitted to the hospital 5/13-5/27/15 for A/C HF and NSTEMI. Troponin peaked at 6.7 and was taken for cath 08/24/13 showing moderate diffuse mid LAD and septal perforator branck with ostial ~90% occluded. Treated medically. History of chronic anemia and Hgb continued to drop and GI consulted and they did EGD showing no evidence of bleeding or pathology. Cr started rising and PICC was placed for CVP monitoring and co-oxs. She was placed on milrinone and once weaned off co-ox remained marginal in the mid 25s. Discharge weight 236 lbs.   Follow up for Heart Failure:  Since last visit he she was evaluated by Dr Caryl Comes for ICD. Dr Caryl Comes sent her for colonoscopy on September 10th. She had 3 polyps were removed. At the last visit in the HF clinic carvedilol was increased to 6.25 mg/9.375 mg and she started on 5 mg lisinopril daily. Weight at home 221-227 pounds. Mild dyspnea with steps. Dizzy when she stands. Has had problems with L ear in the past. Has an appointment with PCP  next week 02/06/14 . Says urine is dark color. Taking all medications.   ECHO (08/2013) EF 20-25%, RV normal    Labs: (09/11/13): Creatinine 1.3, K 4.2, BUN 29, dig level 0.4 (09/20/13) Creatinine 1.36 K 4.9  (10/03/13) K 5.8, creatinine 1.67, BUN 51 (10/11/13) K 5.0, creatinine 1.21, BUN 26 (11/26/13) K 3.7 Creatinine 1.0   SH: Lives in [place] with son and daughter, no ETOH and does not smoke, retired FH: Mother deceased: CAD?, HTN, lung cancer, afib        Father deceased: DM2, lung cancer, HTN, CAD stents        3 daughters (1 has afib)        1 son: healthy and alive  ROS: All systems negative except as listed in  HPI, PMH and Problem List.  Past Medical History  Diagnosis Date  . Obesity   . Chronic low back pain     Secondary to DJD  . Vitamin D deficiency   . Fatty liver disease, nonalcoholic   . Type 2 diabetes mellitus   . Allergic rhinitis   . Essential hypertension, benign   . Chronic systolic heart failure     a. echo (5/15):  mod LVH, EF 20-25%, diff HK, severe LAE, mild RAE  . GERD (gastroesophageal reflux disease)   . Coronary atherosclerosis-non obstructive     LHC (5/15):  EF 40-45% global HK; long LAD 40-60%, ostial 1st major septal perf 80-90%, ostial CFX ?%, mid AVCFX extensive Ca2+, dist PDA 50%  . Osteopenia   . Depression   . Rosacea   . Mitral regurgitation   . NICM (nonischemic cardiomyopathy)     a. echo (5/15):  mod LVH, EF 20-25%, diff HK, severe LAE, mild RAE  . Atrial fibrillation   . 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 Iron deficiency anemia   . NSTEMI (non-ST elevated myocardial infarction) may 2015    No CAD  . PONV (postoperative nausea and vomiting)     Current Outpatient Prescriptions  Medication Sig Dispense Refill  . apixaban (ELIQUIS) 5 MG TABS  tablet Take 5 mg by mouth 2 (two) times daily.      . Calcium Carb-Cholecalciferol (CALCIUM 600 + D PO) Take 1 tablet by mouth every morning.      . carvedilol (COREG) 6.25 MG tablet Take 6.25 mg by mouth 2 (two) times daily with a meal. Takes one tablet in the morning and one and a half nightly.      . digoxin (LANOXIN) 0.125 MG tablet Take 125 mcg by mouth every morning.       Marland Kitchen doxycycline (VIBRAMYCIN) 100 MG capsule Take 100-200 mg by mouth 2 (two) times daily as needed (rosacea).       . ferrous sulfate 325 (65 FE) MG tablet Take 1 tablet (325 mg total) by mouth daily with breakfast.  90 tablet  3  . furosemide (LASIX) 80 MG tablet Take 80 mg by mouth 2 (two) times daily.      . insulin glargine (LANTUS) 100 UNIT/ML injection Inject 65 Units into the skin 2 (two) times daily. 63 units in the evening and 63  units at night.      Marland Kitchen lisinopril (PRINIVIL,ZESTRIL) 5 MG tablet Take 5 mg by mouth every morning.      . metroNIDAZOLE (METROGEL) 0.75 % gel Apply 1 application topically 2 (two) times daily as needed (rosacea).      . mometasone (ELOCON) 0.1 % lotion Apply 1 application topically daily as needed (eczema). EAR ECZEMA      . omeprazole (PRILOSEC) 20 MG capsule Take 20 mg by mouth 2 (two) times daily.      Marland Kitchen oxyCODONE-acetaminophen (PERCOCET) 10-325 MG per tablet Take 1 tablet by mouth every 6 (six) hours as needed for pain.       . rosuvastatin (CRESTOR) 20 MG tablet Take 20 mg by mouth at bedtime.      Marland Kitchen venlafaxine (EFFEXOR) 75 MG tablet Take 75 mg by mouth 2 (two) times daily.        No current facility-administered medications for this encounter.     Filed Vitals:   01/10/14 1438  BP: 132/74  Pulse: 86  Weight: 231 lb (104.781 kg)  SpO2: 92%   Sitting 118/60  Standing 120 /62    PHYSICAL EXAM: General:  Elderly appearing. No resp difficulty, walked in with walker, daughter present.  HEENT: normal Neck: supple. JVP 5-6 Carotids 2+ bilaterally; no bruits. No lymphadenopathy or thryomegaly appreciated. Cor: PMI normal. Regular rate & irregular rhythm, no rubs, gallops or murmurs. Lungs: clear Abdomen: soft, nontender, nondistended. No hepatosplenomegaly. No bruits or masses. Good bowel sounds. Extremities: no cyanosis, clubbing, rash, edema Neuro: alert & orientedx3, cranial nerves grossly intact. Moves all 4 extremities w/o difficulty. Affect pleasant.   ASSESSMENT & PLAN:  1) Chronic systolic HF: NICM, EF 80-99%, RV nl (08/2013) - NYHA II symptoms and volume status stable. Will continue lasix 80 mg BID and discussed the use of sliding scale diuretics.  - Increase coreg to 9.375 mg qam and continue 9.375 mg qpm.  - Continue Lisinopril 5 mg daily   - Continue digoxin .125 mcg. Last digoxin level 0.4 (09/2013) - Plan to follow up with Dr Caryl Comes for ICD.  - Reinforced the need  and importance of daily weights, a low sodium diet, and fluid restriction (less than 2 L a day). Instructed to call the HF clinic if weight increases more than 3 lbs overnight or 5 lbs in a week.  2) CAD:  -Moderate diffuse mid LAD disease of 40-60%. Septal perforator  branch with ostial ~90%.  - No evidence of ischemia. Continue statin and BB. Continue ace.  3) Hyperkalemia- - In the past had hyperkalemia. Check BMET.  4) CKD stage III - baseline Cr 1.4-1.6. Check today. 5) Afib - Chronic with rate controlled. Will continue Eliquis 5 mg BID and BB. Check UA ? hematuria 6) HTN Stable. Continue current regimen.  7) Vertigo  Has follow up with PCP.    Follow up 6 weeks   CLEGG,AMY NP-C 2:58 PM  Patient seen and examined with Darrick Grinder, NP. We discussed all aspects of the encounter. I agree with the assessment and plan as stated above. Doing well from HF standpoint. Agree with titrating carvedilol. PCP to evaluate vertigo sx.   Benay Spice 6:48 PM

## 2014-01-16 ENCOUNTER — Encounter: Payer: Self-pay | Admitting: *Deleted

## 2014-01-16 NOTE — Telephone Encounter (Signed)
Called patient back to schedule CRT-D implant. Scheduled 10/21. Pre procedure labs 10/14. Wound check for 11/02. Letter of instructions reviewed with the patient and left at front desk for her to pick up Friday when she is in the office for lab work. Patient verbalized understanding and agreeable to plan.

## 2014-01-23 ENCOUNTER — Other Ambulatory Visit: Payer: Medicare Other

## 2014-01-24 ENCOUNTER — Telehealth: Payer: Self-pay | Admitting: Internal Medicine

## 2014-01-24 ENCOUNTER — Other Ambulatory Visit: Payer: Medicare Other

## 2014-01-24 NOTE — Telephone Encounter (Signed)
New problem   Per pt's daughter pt is sick and need to cancel her surgery 01/30/14. Please advise.

## 2014-01-29 NOTE — Telephone Encounter (Signed)
Patient is still not feeling well. Procedure for 10/21 cancelled. I will call patient back next week to reschedule this.  She is agreeable to this plan.

## 2014-01-30 ENCOUNTER — Encounter (HOSPITAL_COMMUNITY): Admission: RE | Payer: Self-pay | Source: Ambulatory Visit

## 2014-01-30 ENCOUNTER — Ambulatory Visit (HOSPITAL_COMMUNITY): Admission: RE | Admit: 2014-01-30 | Payer: Medicare Other | Source: Ambulatory Visit | Admitting: Internal Medicine

## 2014-01-30 SURGERY — BI-VENTRICULAR IMPLANTABLE CARDIOVERTER DEFIBRILLATOR  (CRT-D)
Anesthesia: LOCAL

## 2014-02-05 ENCOUNTER — Other Ambulatory Visit (HOSPITAL_COMMUNITY): Payer: Self-pay

## 2014-02-05 MED ORDER — APIXABAN 5 MG PO TABS: 5.0000 mg | ORAL_TABLET | Freq: Two times a day (BID) | ORAL | Status: DC

## 2014-02-11 ENCOUNTER — Ambulatory Visit: Payer: Medicare Other

## 2014-02-15 NOTE — Telephone Encounter (Signed)
lmtcb to reschedule procedure

## 2014-02-21 ENCOUNTER — Ambulatory Visit (HOSPITAL_COMMUNITY)
Admission: RE | Admit: 2014-02-21 | Discharge: 2014-02-21 | Disposition: A | Discharge status: Home / Self Care | Payer: Medicare Other | Source: Ambulatory Visit | Attending: Internal Medicine | Admitting: Internal Medicine

## 2014-02-21 VITALS — BP 160/68 | HR 86 | Wt 225.8 lb

## 2014-02-21 DIAGNOSIS — I429 Cardiomyopathy, unspecified: Secondary | ICD-10-CM | POA: Diagnosis not present

## 2014-02-21 DIAGNOSIS — Z794 Long term (current) use of insulin: Secondary | ICD-10-CM | POA: Diagnosis not present

## 2014-02-21 DIAGNOSIS — I252 Old myocardial infarction: Secondary | ICD-10-CM | POA: Diagnosis not present

## 2014-02-21 DIAGNOSIS — I2510 Atherosclerotic heart disease of native coronary artery without angina pectoris: Secondary | ICD-10-CM | POA: Diagnosis not present

## 2014-02-21 DIAGNOSIS — K219 Gastro-esophageal reflux disease without esophagitis: Secondary | ICD-10-CM | POA: Diagnosis not present

## 2014-02-21 DIAGNOSIS — I5022 Chronic systolic (congestive) heart failure: Secondary | ICD-10-CM | POA: Insufficient documentation

## 2014-02-21 DIAGNOSIS — I482 Chronic atrial fibrillation: Secondary | ICD-10-CM | POA: Insufficient documentation

## 2014-02-21 DIAGNOSIS — N183 Chronic kidney disease, stage 3 (moderate): Secondary | ICD-10-CM | POA: Insufficient documentation

## 2014-02-21 DIAGNOSIS — G8929 Other chronic pain: Secondary | ICD-10-CM | POA: Diagnosis not present

## 2014-02-21 DIAGNOSIS — E875 Hyperkalemia: Secondary | ICD-10-CM | POA: Insufficient documentation

## 2014-02-21 DIAGNOSIS — Z79899 Other long term (current) drug therapy: Secondary | ICD-10-CM | POA: Diagnosis not present

## 2014-02-21 DIAGNOSIS — I129 Hypertensive chronic kidney disease with stage 1 through stage 4 chronic kidney disease, or unspecified chronic kidney disease: Secondary | ICD-10-CM | POA: Insufficient documentation

## 2014-02-21 DIAGNOSIS — M549 Dorsalgia, unspecified: Secondary | ICD-10-CM | POA: Insufficient documentation

## 2014-02-21 DIAGNOSIS — E119 Type 2 diabetes mellitus without complications: Secondary | ICD-10-CM | POA: Insufficient documentation

## 2014-02-21 DIAGNOSIS — I481 Persistent atrial fibrillation: Secondary | ICD-10-CM | POA: Diagnosis not present

## 2014-02-21 DIAGNOSIS — I4819 Other persistent atrial fibrillation: Secondary | ICD-10-CM

## 2014-02-21 DIAGNOSIS — Z7901 Long term (current) use of anticoagulants: Secondary | ICD-10-CM | POA: Insufficient documentation

## 2014-02-21 DIAGNOSIS — D539 Nutritional anemia, unspecified: Secondary | ICD-10-CM | POA: Insufficient documentation

## 2014-02-21 MED ORDER — LISINOPRIL 20 MG PO TABS: 20.0000 mg | ORAL_TABLET | Freq: Every morning | ORAL | Status: DC

## 2014-02-21 MED ORDER — CARVEDILOL 12.5 MG PO TABS: 12.5000 mg | ORAL_TABLET | Freq: Two times a day (BID) | ORAL | Status: DC

## 2014-02-21 NOTE — Addendum Note (Signed)
Encounter addended by: Scarlette Calico, RN on: 02/21/2014  2:43 PM<BR>     Documentation filed: Orders, Dx Association, Patient Instructions Section

## 2014-02-21 NOTE — Patient Instructions (Signed)
Increase Carvedilol to 12.5 mg Twice daily   Increase Lisinopril to 20 mg daily  Labs in 1 week (bmet)  Your physician has requested that you have an echocardiogram. Echocardiography is a painless test that uses sound waves to create images of your heart. It provides your doctor with information about the size and shape of your heart and how well your heart's chambers and valves are working. This procedure takes approximately one hour. There are no restrictions for this procedure.  Your physician recommends that you schedule a follow-up appointment in: 6 weeks

## 2014-02-21 NOTE — Progress Notes (Signed)
Patient ID: Victoria Adams, female   DOB: 1940-06-17, 73 y.o.   MRN: [id number]  PCP: Dr. Gar Ponto ([facility] in [place]) Primary Cardiologist: Dr. Domenic Polite EP: Dr Caryl Comes   HPI: Victoria Adams is a 73 yo female with a history of chronic anemia, HTN, chronic A-Fib, NICM, DM2, CAD, chronic back pain and chronic systolic HF.   Admitted to the hospital 5/13-5/27/15 for A/C HF and NSTEMI. Troponin peaked at 6.7 and was taken for cath 08/24/13 showing moderate diffuse mid LAD and septal perforator branck with ostial ~90% occluded. Treated medically. History of chronic anemia and Hgb continued to drop and GI consulted and they did EGD showing no evidence of bleeding or pathology. Cr started rising and PICC was placed for CVP monitoring and co-oxs. She was placed on milrinone and once weaned off co-ox remained marginal in the mid 28s. Discharge weight 236 lbs.   Follow up for Heart Failure:   At the last visit in the HF clinic carvedilol was increased to 9.375 mg/9.375 mg. Tolerated well. Says she just has occasional DOE but better than before. Over past 3 months SBP trending up often in the 150-160s. Weigh down to 221 (down 5 -10 pounds). Minimal edema. Hard to get around too much due to bad back.   ECHO (08/2013) EF 20-25%, RV normal    Labs: (09/11/13): Creatinine 1.3, K 4.2, BUN 29, dig level 0.4 (09/20/13) Creatinine 1.36 K 4.9  (10/03/13) K 5.8, creatinine 1.67, BUN 51 (10/11/13) K 5.0, creatinine 1.21, BUN 26 (11/26/13) K 3.7 Creatinine 1.0  01/10/14 K 4.2 Cr 1.07   SH: Lives in [place] with son and daughter, no ETOH and does not smoke, retired [place]: Mother deceased: CAD?, HTN, lung cancer, afib        Father deceased: DM2, lung cancer, HTN, CAD stents        3 daughters (1 has afib)        1 son: healthy and alive  ROS: All systems negative except as listed in HPI, PMH and Problem List.  Past Medical History  Diagnosis Date  . Obesity   . Chronic low back pain     Secondary to DJD  . Vitamin D  deficiency   . Fatty liver disease, nonalcoholic   . Type 2 diabetes mellitus   . Allergic rhinitis   . Essential hypertension, benign   . Chronic systolic heart failure     a. echo (5/15):  mod LVH, EF 20-25%, diff HK, severe LAE, mild RAE  . GERD (gastroesophageal reflux disease)   . Coronary atherosclerosis-non obstructive     LHC (5/15):  EF 40-45% global HK; long LAD 40-60%, ostial 1st major septal perf 80-90%, ostial CFX ?%, mid AVCFX extensive Ca2+, dist PDA 50%  . Osteopenia   . Depression   . Rosacea   . Mitral regurgitation   . NICM (nonischemic cardiomyopathy)     a. echo (5/15):  mod LVH, EF 20-25%, diff HK, severe LAE, mild RAE  . Atrial fibrillation   . 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 Iron deficiency anemia   . NSTEMI (non-ST elevated myocardial infarction) may 2015    No CAD  . PONV (postoperative nausea and vomiting)     Current Outpatient Prescriptions  Medication Sig Dispense Refill  . apixaban (ELIQUIS) 5 MG TABS tablet Take 1 tablet (5 mg total) by mouth 2 (two) times daily. 60 tablet 3  . Calcium Carb-Cholecalciferol (CALCIUM 600 + D PO)  Take 1 tablet by mouth every morning.    . carvedilol (COREG) 6.25 MG tablet Take 1.5 tablets (9.375 mg total) by mouth 2 (two) times daily with a meal. 90 tablet 6  . digoxin (LANOXIN) 0.125 MG tablet Take 125 mcg by mouth every morning.     Marland Kitchen doxycycline (VIBRAMYCIN) 100 MG capsule Take 100-200 mg by mouth 2 (two) times daily as needed (rosacea).     . ferrous sulfate 325 (65 FE) MG tablet Take 1 tablet (325 mg total) by mouth daily with breakfast. 90 tablet 3  . furosemide (LASIX) 80 MG tablet Take 80 mg by mouth 2 (two) times daily.    . insulin glargine (LANTUS) 100 UNIT/ML injection Inject 65 Units into the skin 2 (two) times daily. 63 units in the evening and 63 units at night.    Marland Kitchen lisinopril (PRINIVIL,ZESTRIL) 5 MG tablet Take 5 mg by mouth every morning.    . metroNIDAZOLE (METROGEL) 0.75 % gel Apply 1 application  topically 2 (two) times daily as needed (rosacea).    . mometasone (ELOCON) 0.1 % lotion Apply 1 application topically daily as needed (eczema). EAR ECZEMA    . omeprazole (PRILOSEC) 20 MG capsule Take 20 mg by mouth 2 (two) times daily.    Marland Kitchen oxyCODONE-acetaminophen (PERCOCET) 10-325 MG per tablet Take 1 tablet by mouth every 6 (six) hours as needed for pain.     . rosuvastatin (CRESTOR) 20 MG tablet Take 20 mg by mouth at bedtime.    Marland Kitchen venlafaxine (EFFEXOR) 75 MG tablet Take 75 mg by mouth 2 (two) times daily.      No current facility-administered medications for this encounter.     Filed Vitals:   02/21/14 1402  BP: 160/68  Pulse: 86  Weight: 225 lb 12 oz (102.4 kg)  SpO2: 96%    PHYSICAL EXAM: General:  Elderly appearing. No resp difficulty, walked in with walker, daughter present.  HEENT: normal Neck: supple. JVP 5-6 Carotids 2+ bilaterally; no bruits. No lymphadenopathy or thryomegaly appreciated. Cor: PMI normal.  irregular rhythm, no rubs, gallops or murmurs. Lungs: clear Abdomen: soft, nontender, nondistended. No hepatosplenomegaly. No bruits or masses. Good bowel sounds. Extremities: no cyanosis, clubbing, rash, tr edema Neuro: alert & orientedx3, cranial nerves grossly intact. Moves all 4 extremities w/o difficulty. Affect pleasant.   ASSESSMENT & PLAN:  1) Chronic systolic HF: NICM, EF 66-06%, RV nl (08/2013) - NYHA II symptoms and volume status stable. Back pain seems more of a limiting factor than her heart.  - BP has been up and I am hopeful that this may be a sign LV recovery - Will increase lisinopril 20 daily. Check BMET in 1 week.  - Will continue lasix 80 mg BID and discussed the use of sliding scale diuretics.  - Increase coreg to 12.5 mg bid  - Continue digoxin .125 mcg. Last digoxin level 0.4 (09/2013) - Recheck echo next week or two. If EF has not improved will need to f/u with Dr. Caryl Comes regarding ICD 2) CAD:  -Moderate diffuse mid LAD disease of 40-60%.  Septal perforator branch with ostial ~90%.  - No evidence of ischemia. Continue statin and BB. Continue ace.  3) Hyperkalemia- - Last potassium ok. Watch carefully as we titrate lisinopril  4) CKD stage III - baseline Cr 1.4-1.6. Last check 1.1 5) Afib - Chronic with rate controlled. Will continue Eliquis 5 mg BID and BB.  6) HTN - BP high. Increasing lisinopril. Denies OSA symptoms. Has had 2  sleep studies in past  Follow up 6 weeks   Benay Spice 2:27 PM

## 2014-02-26 NOTE — Telephone Encounter (Signed)
Left another message.

## 2014-02-27 ENCOUNTER — Ambulatory Visit (HOSPITAL_COMMUNITY)
Admission: RE | Admit: 2014-02-27 | Discharge: 2014-02-27 | Disposition: A | Discharge status: Home / Self Care | Payer: Medicare Other | Source: Ambulatory Visit | Attending: Internal Medicine | Admitting: Internal Medicine

## 2014-02-27 ENCOUNTER — Ambulatory Visit (HOSPITAL_BASED_OUTPATIENT_CLINIC_OR_DEPARTMENT_OTHER)
Admission: RE | Admit: 2014-02-27 | Discharge: 2014-02-27 | Disposition: A | Discharge status: Home / Self Care | Payer: Medicare Other | Source: Ambulatory Visit | Attending: Internal Medicine | Admitting: Internal Medicine

## 2014-02-27 DIAGNOSIS — I5022 Chronic systolic (congestive) heart failure: Secondary | ICD-10-CM

## 2014-02-27 DIAGNOSIS — I509 Heart failure, unspecified: Secondary | ICD-10-CM | POA: Diagnosis not present

## 2014-02-27 DIAGNOSIS — I369 Nonrheumatic tricuspid valve disorder, unspecified: Secondary | ICD-10-CM | POA: Diagnosis not present

## 2014-02-27 LAB — BASIC METABOLIC PANEL
Anion gap: 14 (ref 5–15)
BUN: 16 mg/dL (ref 6–23)
CHLORIDE: 100 meq/L (ref 96–112)
CO2: 26 meq/L (ref 19–32)
Calcium: 8.8 mg/dL (ref 8.4–10.5)
Creatinine, Ser: 1.01 mg/dL (ref 0.50–1.10)
GFR calc non Af Amer: 55 mL/min — ABNORMAL LOW (ref >90)
GFR, EST AFRICAN AMERICAN: 63 mL/min — AB (ref >90)
Glucose, Bld: 128 mg/dL — ABNORMAL HIGH (ref 70–99)
Potassium: 4 mEq/L (ref 3.7–5.3)
Sodium: 140 mEq/L (ref 137–147)

## 2014-02-27 NOTE — Progress Notes (Signed)
Echocardiogram 2D Echocardiogram has been performed.  Victoria Adams 02/27/2014, 4:35 PM

## 2014-03-06 ENCOUNTER — Telehealth (HOSPITAL_COMMUNITY): Payer: Self-pay | Admitting: *Deleted

## 2014-03-06 DIAGNOSIS — I5022 Chronic systolic (congestive) heart failure: Secondary | ICD-10-CM

## 2014-03-06 NOTE — Telephone Encounter (Signed)
Per Dr Haroldine Laws, EF down pt needs referral to EP for ICD, he called and discussed w/pt, sch pt to see Dr Caryl Comes on 1/22 at 12 pm, attempted to call pt and Left message to call back

## 2014-03-15 NOTE — Telephone Encounter (Signed)
Left message to call back  

## 2014-03-21 ENCOUNTER — Ambulatory Visit: Payer: Self-pay | Admitting: *Deleted

## 2014-03-21 ENCOUNTER — Encounter (HOSPITAL_COMMUNITY): Payer: Self-pay | Admitting: Cardiology

## 2014-03-21 DIAGNOSIS — I4891 Unspecified atrial fibrillation: Secondary | ICD-10-CM

## 2014-03-26 ENCOUNTER — Other Ambulatory Visit: Payer: Self-pay | Admitting: Physician Assistant

## 2014-03-29 ENCOUNTER — Encounter (HOSPITAL_COMMUNITY): Payer: Self-pay | Admitting: *Deleted

## 2014-03-29 NOTE — Telephone Encounter (Signed)
Patient scheduled to see Dr. Caryl Comes 05/03/14 to re-evaluate ICD implant.

## 2014-03-29 NOTE — Telephone Encounter (Signed)
Still unable to reach pt, mailed letter for pt to call for results

## 2014-04-02 ENCOUNTER — Ambulatory Visit (HOSPITAL_COMMUNITY)
Admission: RE | Admit: 2014-04-02 | Discharge: 2014-04-02 | Disposition: A | Discharge status: Home / Self Care | Payer: Medicare Other | Source: Ambulatory Visit | Attending: Internal Medicine | Admitting: Internal Medicine

## 2014-04-02 VITALS — BP 132/62 | HR 66 | Wt 229.8 lb

## 2014-04-02 DIAGNOSIS — I129 Hypertensive chronic kidney disease with stage 1 through stage 4 chronic kidney disease, or unspecified chronic kidney disease: Secondary | ICD-10-CM | POA: Diagnosis not present

## 2014-04-02 DIAGNOSIS — K219 Gastro-esophageal reflux disease without esophagitis: Secondary | ICD-10-CM | POA: Diagnosis not present

## 2014-04-02 DIAGNOSIS — I429 Cardiomyopathy, unspecified: Secondary | ICD-10-CM | POA: Diagnosis not present

## 2014-04-02 DIAGNOSIS — I4891 Unspecified atrial fibrillation: Secondary | ICD-10-CM | POA: Diagnosis not present

## 2014-04-02 DIAGNOSIS — I252 Old myocardial infarction: Secondary | ICD-10-CM | POA: Diagnosis not present

## 2014-04-02 DIAGNOSIS — E875 Hyperkalemia: Secondary | ICD-10-CM | POA: Diagnosis not present

## 2014-04-02 DIAGNOSIS — I10 Essential (primary) hypertension: Secondary | ICD-10-CM | POA: Diagnosis not present

## 2014-04-02 DIAGNOSIS — F329 Major depressive disorder, single episode, unspecified: Secondary | ICD-10-CM | POA: Insufficient documentation

## 2014-04-02 DIAGNOSIS — G8929 Other chronic pain: Secondary | ICD-10-CM | POA: Diagnosis not present

## 2014-04-02 DIAGNOSIS — E559 Vitamin D deficiency, unspecified: Secondary | ICD-10-CM | POA: Insufficient documentation

## 2014-04-02 DIAGNOSIS — I480 Paroxysmal atrial fibrillation: Secondary | ICD-10-CM | POA: Diagnosis not present

## 2014-04-02 DIAGNOSIS — Z79899 Other long term (current) drug therapy: Secondary | ICD-10-CM | POA: Diagnosis not present

## 2014-04-02 DIAGNOSIS — M549 Dorsalgia, unspecified: Secondary | ICD-10-CM | POA: Diagnosis not present

## 2014-04-02 DIAGNOSIS — Z7902 Long term (current) use of antithrombotics/antiplatelets: Secondary | ICD-10-CM | POA: Insufficient documentation

## 2014-04-02 DIAGNOSIS — D509 Iron deficiency anemia, unspecified: Secondary | ICD-10-CM | POA: Insufficient documentation

## 2014-04-02 DIAGNOSIS — I2510 Atherosclerotic heart disease of native coronary artery without angina pectoris: Secondary | ICD-10-CM | POA: Insufficient documentation

## 2014-04-02 DIAGNOSIS — Z794 Long term (current) use of insulin: Secondary | ICD-10-CM | POA: Diagnosis not present

## 2014-04-02 DIAGNOSIS — I5022 Chronic systolic (congestive) heart failure: Secondary | ICD-10-CM | POA: Diagnosis not present

## 2014-04-02 DIAGNOSIS — N183 Chronic kidney disease, stage 3 (moderate): Secondary | ICD-10-CM | POA: Diagnosis not present

## 2014-04-02 DIAGNOSIS — I340 Nonrheumatic mitral (valve) insufficiency: Secondary | ICD-10-CM | POA: Insufficient documentation

## 2014-04-02 DIAGNOSIS — I951 Orthostatic hypotension: Secondary | ICD-10-CM

## 2014-04-02 DIAGNOSIS — E119 Type 2 diabetes mellitus without complications: Secondary | ICD-10-CM | POA: Diagnosis not present

## 2014-04-02 LAB — BASIC METABOLIC PANEL
Anion gap: 7 (ref 5–15)
BUN: 19 mg/dL (ref 6–23)
CO2: 30 mmol/L (ref 19–32)
CREATININE: 1.14 mg/dL — AB (ref 0.50–1.10)
Calcium: 8.8 mg/dL (ref 8.4–10.5)
Chloride: 100 mEq/L (ref 96–112)
GFR, EST AFRICAN AMERICAN: 55 mL/min — AB (ref >90)
GFR, EST NON AFRICAN AMERICAN: 47 mL/min — AB (ref >90)
Glucose, Bld: 215 mg/dL — ABNORMAL HIGH (ref 70–99)
POTASSIUM: 4.1 mmol/L (ref 3.5–5.1)
Sodium: 137 mmol/L (ref 135–145)

## 2014-04-02 LAB — IRON AND TIBC
Iron: 95 ug/dL (ref 42–135)
SATURATION RATIOS: 37 % (ref 20–55)
TIBC: 259 ug/dL (ref 250–470)
UIBC: 164 ug/dL (ref 125–400)

## 2014-04-02 MED ORDER — SACUBITRIL-VALSARTAN 24-26 MG PO TABS: 1.0000 | ORAL_TABLET | Freq: Two times a day (BID) | ORAL | Status: DC

## 2014-04-02 NOTE — Patient Instructions (Addendum)
Stop Lisinopril  Start Entresto 24/26 mg Twice daily starting on Thursday 04/04/14  Labs today  Your physician recommends that you schedule a follow-up appointment in: 2 weeks  Discontinue digoxin

## 2014-04-02 NOTE — Progress Notes (Addendum)
Patient ID: Victoria Adams, female   DOB: 10-21-1940, 73 y.o.   MRN: [id number]  PCP: Dr. Gar Ponto ([facility] in [place]) Primary Cardiologist: Dr. Domenic Polite EP: Dr Caryl Comes   HPI: Ms. Fine is a 73 yo female with a history of chronic anemia, HTN, chronic A-Fib, NICM, DM2, CAD, chronic back pain and chronic systolic HF.   Admitted to the hospital 5/13-5/27/15 for A/C HF and NSTEMI. Troponin peaked at 6.7 and was taken for cath 08/24/13 showing moderate diffuse mid LAD and septal perforator branck with ostial ~90% occluded. Treated medically. History of chronic anemia and Hgb continued to drop and GI consulted and they did EGD showing no evidence of bleeding or pathology. Cr started rising and PICC was placed for CVP monitoring and co-oxs. She was placed on milrinone and once weaned off co-ox remained marginal in the mid 36s. Discharge weight 236 lbs.   Follow up for Heart Failure: Last visit lisinopril increased to 20 mg daily. Overall doing ok other than can't sleep at night. Stays cold all the time. Denies SOB, orthopnea, PND or CP. Able to walk for about 10 minutes before stopping. Weight at home 223-225 lbs. Taking medications as prescribed. Following a low salt diet and drinking less than 2L a day.   ECHO (08/2013) EF 20-25%, RV normal            (02/2014) EF 25-30%, RV normal   Labs: (09/11/13): Creatinine 1.3, K 4.2, BUN 29, dig level 0.4 (09/20/13) Creatinine 1.36 K 4.9  (10/03/13) K 5.8, creatinine 1.67, BUN 51 (10/11/13) K 5.0, creatinine 1.21, BUN 26 (11/26/13) K 3.7 Creatinine 1.0  01/10/14 K 4.2 Cr 1.07  (02/27/14) K 4.0, creatinine 1.01  SH: Lives in [place] with son and daughter, no ETOH and does not smoke, retired [place]: Mother deceased: CAD?, HTN, lung cancer, afib        Father deceased: DM2, lung cancer, HTN, CAD stents        3 daughters (1 has afib)        1 son: healthy and alive  ROS: All systems negative except as listed in HPI, PMH and Problem List.  Past Medical History   Diagnosis Date  . Obesity   . Chronic low back pain     Secondary to DJD  . Vitamin D deficiency   . Fatty liver disease, nonalcoholic   . Type 2 diabetes mellitus   . Allergic rhinitis   . Essential hypertension, benign   . Chronic systolic heart failure     a. echo (5/15):  mod LVH, EF 20-25%, diff HK, severe LAE, mild RAE  . GERD (gastroesophageal reflux disease)   . Coronary atherosclerosis-non obstructive     LHC (5/15):  EF 40-45% global HK; long LAD 40-60%, ostial 1st major septal perf 80-90%, ostial CFX ?%, mid AVCFX extensive Ca2+, dist PDA 50%  . Osteopenia   . Depression   . Rosacea   . Mitral regurgitation   . NICM (nonischemic cardiomyopathy)     a. echo (5/15):  mod LVH, EF 20-25%, diff HK, severe LAE, mild RAE  . Atrial fibrillation   . 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 Iron deficiency anemia   . NSTEMI (non-ST elevated myocardial infarction) may 2015    No CAD  . PONV (postoperative nausea and vomiting)     Current Outpatient Prescriptions  Medication Sig Dispense Refill  . apixaban (ELIQUIS) 5 MG TABS tablet Take 1 tablet (5 mg total)  by mouth 2 (two) times daily. 60 tablet 3  . Calcium Carb-Cholecalciferol (CALCIUM 600 + D PO) Take 1 tablet by mouth every morning.    . carvedilol (COREG) 12.5 MG tablet Take 1 tablet (12.5 mg total) by mouth 2 (two) times daily with a meal. 60 tablet 3  . CRESTOR 20 MG tablet TAKE 1 TABLET ONCE DAILY AT 6PM. 30 tablet 2  . digoxin (LANOXIN) 0.125 MG tablet Take 125 mcg by mouth every morning.     Marland Kitchen doxycycline (VIBRAMYCIN) 100 MG capsule Take 100-200 mg by mouth 2 (two) times daily as needed (rosacea).     . ferrous sulfate 325 (65 FE) MG tablet Take 1 tablet (325 mg total) by mouth daily with breakfast. 90 tablet 3  . furosemide (LASIX) 80 MG tablet Take 80 mg by mouth 2 (two) times daily.    . insulin glargine (LANTUS) 100 UNIT/ML injection Inject 65 Units into the skin 2 (two) times daily. 63 units in the evening and 63  units at night.    Marland Kitchen lisinopril (PRINIVIL,ZESTRIL) 20 MG tablet Take 1 tablet (20 mg total) by mouth every morning. 30 tablet 3  . metroNIDAZOLE (METROGEL) 0.75 % gel Apply 1 application topically 2 (two) times daily as needed (rosacea).    . mometasone (ELOCON) 0.1 % lotion Apply 1 application topically daily as needed (eczema). EAR ECZEMA    . omeprazole (PRILOSEC) 20 MG capsule Take 20 mg by mouth 2 (two) times daily.    Marland Kitchen oxyCODONE-acetaminophen (PERCOCET) 10-325 MG per tablet Take 1 tablet by mouth every 6 (six) hours as needed for pain.     Marland Kitchen venlafaxine (EFFEXOR) 75 MG tablet Take 75 mg by mouth 2 (two) times daily.      No current facility-administered medications for this encounter.     Filed Vitals:   04/02/14 1456  BP: 132/62  Pulse: 66  Weight: 229 lb 12.8 oz (104.237 kg)  SpO2: 94%    PHYSICAL EXAM: General:  Elderly appearing. No resp difficulty, walked in with walker, daughter present.  HEENT: normal Neck: supple. JVP 5-6 Carotids 2+ bilaterally; no bruits. No lymphadenopathy or thryomegaly appreciated. Cor: PMI normal.  irregular rhythm, no rubs, gallops or murmurs. Lungs: clear Abdomen: soft, nontender, nondistended. No hepatosplenomegaly. No bruits or masses. Good bowel sounds. Extremities: no cyanosis, clubbing, rash, tr edema Neuro: alert & orientedx3, cranial nerves grossly intact. Moves all 4 extremities w/o difficulty. Affect pleasant.   ASSESSMENT & PLAN:  1) Chronic systolic HF: NICM, EF 57-01%, RV nl (02/2014) - NYHA II symptoms and volume status stable. Will continue lasix 80 mg BID. Discussed the use of sliding scale diuretics. Check BMET today.  - Continue coreg at current dose. - Stop lisinopril and start Entresto 24-26 BID starting on Thursday. Provided patient with 14 day sample in hopes that she will tolerate and we can titrate to next dose next visit and can give her the 30 day free card then. Check BMET in 7-10 days. Told to call if any  dizziness.  - Stop digoxin.  - EF not improved and has an appt with Dr. Caryl Comes in January to discuss ICD. She does not have LBBB so no CRT-D. Would prefer Medtronic d/t HF diagnostics.  - Reinforced the need and importance of daily weights, a low sodium diet, and fluid restriction (less than 2 L a day). Instructed to call the HF clinic if weight increases more than 3 lbs overnight or 5 lbs in a week.  2)  CAD:  -Moderate diffuse mid LAD disease of 40-60%. Septal perforator branch with ostial ~90%.  - No evidence s/s of ischemia. Continue statin and BB. 3) Hyperkalemia- - Last K+ 4.0. Recheck today.  4) CKD stage III - baseline Cr 1.4-1.6. Recheck today.  5) Afib - Chronic with rate controlled. Will continue Eliquis 5 mg BID and BB.  6) HTN - As above stop lisinopril and start Entresto 24-26 BID. Denies OSA symptoms. Has had 2 sleep studies in past 7) Anemia - Will check iron studies today. Continue PO iron   F/U 2 weeks for medication titration.   Rande Brunt, NP-C 3:04 PM

## 2014-04-16 ENCOUNTER — Ambulatory Visit (HOSPITAL_COMMUNITY)
Admission: RE | Admit: 2014-04-16 | Discharge: 2014-04-16 | Disposition: A | Discharge status: Home / Self Care | Payer: Medicare Other | Source: Ambulatory Visit | Attending: Cardiology | Admitting: Cardiology

## 2014-04-16 DIAGNOSIS — I2510 Atherosclerotic heart disease of native coronary artery without angina pectoris: Secondary | ICD-10-CM | POA: Diagnosis not present

## 2014-04-16 DIAGNOSIS — Z79899 Other long term (current) drug therapy: Secondary | ICD-10-CM | POA: Diagnosis not present

## 2014-04-16 DIAGNOSIS — Z7901 Long term (current) use of anticoagulants: Secondary | ICD-10-CM | POA: Diagnosis not present

## 2014-04-16 DIAGNOSIS — I5022 Chronic systolic (congestive) heart failure: Secondary | ICD-10-CM

## 2014-04-16 DIAGNOSIS — I129 Hypertensive chronic kidney disease with stage 1 through stage 4 chronic kidney disease, or unspecified chronic kidney disease: Secondary | ICD-10-CM | POA: Diagnosis not present

## 2014-04-16 DIAGNOSIS — Z7982 Long term (current) use of aspirin: Secondary | ICD-10-CM | POA: Diagnosis not present

## 2014-04-16 DIAGNOSIS — E119 Type 2 diabetes mellitus without complications: Secondary | ICD-10-CM | POA: Diagnosis not present

## 2014-04-16 DIAGNOSIS — N183 Chronic kidney disease, stage 3 (moderate): Secondary | ICD-10-CM | POA: Insufficient documentation

## 2014-04-16 DIAGNOSIS — I429 Cardiomyopathy, unspecified: Secondary | ICD-10-CM | POA: Diagnosis not present

## 2014-04-16 DIAGNOSIS — I252 Old myocardial infarction: Secondary | ICD-10-CM | POA: Diagnosis not present

## 2014-04-16 DIAGNOSIS — D509 Iron deficiency anemia, unspecified: Secondary | ICD-10-CM | POA: Diagnosis not present

## 2014-04-16 DIAGNOSIS — I4891 Unspecified atrial fibrillation: Secondary | ICD-10-CM | POA: Insufficient documentation

## 2014-04-16 DIAGNOSIS — F329 Major depressive disorder, single episode, unspecified: Secondary | ICD-10-CM | POA: Insufficient documentation

## 2014-04-16 DIAGNOSIS — K219 Gastro-esophageal reflux disease without esophagitis: Secondary | ICD-10-CM | POA: Diagnosis not present

## 2014-04-16 LAB — BASIC METABOLIC PANEL
Anion gap: 10 (ref 5–15)
BUN: 17 mg/dL (ref 6–23)
CALCIUM: 9.1 mg/dL (ref 8.4–10.5)
CHLORIDE: 102 meq/L (ref 96–112)
CO2: 27 mmol/L (ref 19–32)
Creatinine, Ser: 1.29 mg/dL — ABNORMAL HIGH (ref 0.50–1.10)
GFR, EST AFRICAN AMERICAN: 47 mL/min — AB (ref >90)
GFR, EST NON AFRICAN AMERICAN: 40 mL/min — AB (ref >90)
Glucose, Bld: 110 mg/dL — ABNORMAL HIGH (ref 70–99)
POTASSIUM: 4 mmol/L (ref 3.5–5.1)
SODIUM: 139 mmol/L (ref 135–145)

## 2014-04-16 LAB — CBC
HCT: 43.1 % (ref 36.0–46.0)
Hemoglobin: 13.8 g/dL (ref 12.0–15.0)
MCH: 27.8 pg (ref 26.0–34.0)
MCHC: 32 g/dL (ref 30.0–36.0)
MCV: 86.9 fL (ref 78.0–100.0)
PLATELETS: 233 10*3/uL (ref 150–400)
RBC: 4.96 MIL/uL (ref 3.87–5.11)
RDW: 15 % (ref 11.5–15.5)
WBC: 10.4 10*3/uL (ref 4.0–10.5)

## 2014-04-16 LAB — LIPID PANEL
Cholesterol: 146 mg/dL (ref 0–200)
HDL: 44 mg/dL (ref >39)
LDL Cholesterol: 77 mg/dL (ref 0–99)
Total CHOL/HDL Ratio: 3.3 RATIO
Triglycerides: 125 mg/dL (ref <150)
VLDL: 25 mg/dL (ref 0–40)

## 2014-04-16 MED ORDER — SPIRONOLACTONE 25 MG PO TABS: 12.5000 mg | ORAL_TABLET | Freq: Every day | ORAL | Status: DC

## 2014-04-16 MED ORDER — SACUBITRIL-VALSARTAN 24-26 MG PO TABS: 1.0000 | ORAL_TABLET | Freq: Two times a day (BID) | ORAL | Status: DC

## 2014-04-16 NOTE — Patient Instructions (Signed)
Start Spironolactone 12.5 mg (1/2 tab) daily  Labs today  Labs in 2 weeks (Fri 05/03/14 at Dr Olin Pia office)  Your physician recommends that you schedule a follow-up appointment in: 6 weeks

## 2014-04-17 NOTE — Progress Notes (Signed)
Patient ID: [name], female   DOB: [birth year], 74 y.o.   MRN: [id number]  PCP: Dr. Gar Ponto ([facility] in [place]) Primary Cardiologist: Dr. Domenic Polite EP: Dr Caryl Comes   HPI: Ms. Pinheiro is a 74 yo female with a history of chronic anemia, HTN, chronic A-Fib, nonischemic cardiomyopathy, DM2, CAD, chronic back pain and chronic systolic HF.   Admitted to the hospital 5/13-5/27/15 for A/C HF and NSTEMI. Troponin peaked at 6.7 and was taken for cath 08/24/13 showing moderate diffuse mid LAD and septal perforator branck with ostial ~90% occluded. Treated medically. History of chronic anemia and Hgb continued to drop and GI consulted and they did EGD showing no evidence of bleeding or pathology. Cr started rising and PICC was placed for CVP monitoring and co-oxs. She was placed on milrinone and once weaned off co-ox remained marginal in the mid 70s. Discharge weight 236 lbs.   She is doing well.  No exertional dyspnea. She walks in stores without problems.  No chest pain.  She is on apixaban with no melena or BRBPR.  Weight is down 2 lbs. SBP running in 120s at home.    ECHO (08/2013) EF 20-25%, RV normal            (02/2014) EF 25-30%, RV normal   Labs: (09/11/13): Creatinine 1.3, K 4.2, BUN 29, dig level 0.4 (09/20/13) Creatinine 1.36 K 4.9  (10/03/13) K 5.8, creatinine 1.67, BUN 51 (10/11/13) K 5.0, creatinine 1.21, BUN 26 (11/26/13) K 3.7 Creatinine 1.0  01/10/14 K 4.2 Cr 1.07  (02/27/14) K 4.0, creatinine 1.01 (12/15) K 4.1, creatinine 1.14  SH: Lives in [place] with son and daughter, no ETOH and does not smoke, retired  [place]: Mother deceased: CAD?, HTN, lung cancer, afib        Father deceased: DM2, lung cancer, HTN, CAD stents        3 daughters (1 has afib)        1 son: healthy and alive  ROS: All systems negative except as listed in HPI, PMH and Problem List.  Past Medical History  Diagnosis Date  . Obesity   . Chronic low back pain     Secondary to DJD  . Vitamin D deficiency   . Fatty  liver disease, nonalcoholic   . Type 2 diabetes mellitus   . Allergic rhinitis   . Essential hypertension, benign   . Chronic systolic heart failure     a. echo (5/15):  mod LVH, EF 20-25%, diff HK, severe LAE, mild RAE  . GERD (gastroesophageal reflux disease)   . Coronary atherosclerosis-non obstructive     LHC (5/15):  EF 40-45% global HK; long LAD 40-60%, ostial 1st major septal perf 80-90%, ostial CFX ?%, mid AVCFX extensive Ca2+, dist PDA 50%  . Osteopenia   . Depression   . Rosacea   . Mitral regurgitation   . NICM (nonischemic cardiomyopathy)     a. echo (5/15):  mod LVH, EF 20-25%, diff HK, severe LAE, mild RAE  . Atrial fibrillation   . 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 Iron deficiency anemia   . NSTEMI (non-ST elevated myocardial infarction) may 2015    No CAD  . PONV (postoperative nausea and vomiting)     Current Outpatient Prescriptions  Medication Sig Dispense Refill  . apixaban (ELIQUIS) 5 MG TABS tablet Take 1 tablet (5 mg total) by mouth 2 (two) times daily. 60 tablet 3  . Calcium Carb-Cholecalciferol (CALCIUM 600 +  D PO) Take 1 tablet by mouth every morning.    . carvedilol (COREG) 12.5 MG tablet Take 1 tablet (12.5 mg total) by mouth 2 (two) times daily with a meal. 60 tablet 3  . CRESTOR 20 MG tablet TAKE 1 TABLET ONCE DAILY AT 6PM. 30 tablet 2  . doxycycline (VIBRAMYCIN) 100 MG capsule Take 100-200 mg by mouth 2 (two) times daily as needed (rosacea).     . ferrous sulfate 325 (65 FE) MG tablet Take 1 tablet (325 mg total) by mouth daily with breakfast. 90 tablet 3  . furosemide (LASIX) 80 MG tablet Take 80 mg by mouth 2 (two) times daily.    . insulin glargine (LANTUS) 100 UNIT/ML injection Inject 65 Units into the skin 2 (two) times daily. 63 units in the evening and 63 units at night.    . metroNIDAZOLE (METROGEL) 0.75 % gel Apply 1 application topically 2 (two) times daily as needed (rosacea).    . mometasone (ELOCON) 0.1 % lotion Apply 1 application  topically daily as needed (eczema). EAR ECZEMA    . omeprazole (PRILOSEC) 20 MG capsule Take 20 mg by mouth 2 (two) times daily.    Marland Kitchen oxyCODONE-acetaminophen (PERCOCET) 10-325 MG per tablet Take 1 tablet by mouth every 6 (six) hours as needed for pain.     . sacubitril-valsartan (ENTRESTO) 24-26 MG Take 1 tablet by mouth 2 (two) times daily. 60 tablet 3  . venlafaxine (EFFEXOR) 75 MG tablet Take 75 mg by mouth 2 (two) times daily.     Marland Kitchen spironolactone (ALDACTONE) 25 MG tablet Take 0.5 tablets (12.5 mg total) by mouth daily. 15 tablet 3   No current facility-administered medications for this encounter.     Filed Vitals:   04/16/14 1512  BP: 106/68  Pulse: 105  Weight: 227 lb (102.967 kg)  SpO2: 97%    PHYSICAL EXAM: General:  Elderly appearing. No resp difficulty, walked in with walker, daughter present.  HEENT: normal Neck: supple. JVP 5-6 Carotids 2+ bilaterally; no bruits. No lymphadenopathy or thryomegaly appreciated. Cor: PMI normal.  irregular rhythm, no rubs, gallops or murmurs. Lungs: clear Abdomen: soft, nontender, nondistended. No hepatosplenomegaly. No bruits or masses. Good bowel sounds. Extremities: no cyanosis, clubbing, rash, tr edema Neuro: alert & orientedx3, cranial nerves grossly intact. Moves all 4 extremities w/o difficulty. Affect pleasant.   ASSESSMENT & PLAN:  1) Chronic systolic HF: NICM, EF 03-47%, RV nl (02/2014).  NYHA II symptoms and volume status stable.  - Will continue lasix 80 mg BID. BMET today.  - Continue coreg at current dose. - Continue current Entresto and will add spironolactone 12.5 mg daily.  Repeat BMET in 2 wks.  - EF not improved and she has an appt with Dr. Caryl Comes later this month to discuss ICD. She does not have LBBB so no CRT-D. Would prefer Medtr[employer] for HF diagnostics.  - Reinforced the need and importance of daily weights, a low sodium diet, and fluid restriction (less than 2 L a day). Instructed to call the HF clinic if weight  increases more than 3 lbs overnight or 5 lbs in a week.  2) CAD: Moderate diffuse mid LAD disease of 40-60%. Septal perforator branch with ostial ~90%.  - No chest pain. Continue statin and BB. - Check lipids. 3) CKD stage III: Most recent creatinine lower than in the past (1.1).  4) Afib: Chronic with rate control. Will continue Eliquis 5 mg BID and Coreg. Check CBC given apixaban use.   Followup  in 2 months.   Tea Collums 04/17/2014

## 2014-04-22 ENCOUNTER — Encounter (HOSPITAL_COMMUNITY): Payer: Self-pay | Admitting: *Deleted

## 2014-05-03 ENCOUNTER — Other Ambulatory Visit: Payer: Medicare Other

## 2014-05-03 ENCOUNTER — Ambulatory Visit: Payer: Medicare Other | Admitting: Internal Medicine

## 2014-05-20 ENCOUNTER — Other Ambulatory Visit: Payer: Self-pay | Admitting: Cardiology

## 2014-05-21 ENCOUNTER — Other Ambulatory Visit (HOSPITAL_COMMUNITY): Payer: Self-pay

## 2014-05-21 DIAGNOSIS — I482 Chronic atrial fibrillation: Secondary | ICD-10-CM | POA: Diagnosis not present

## 2014-05-21 DIAGNOSIS — F331 Major depressive disorder, recurrent, moderate: Secondary | ICD-10-CM | POA: Diagnosis not present

## 2014-05-21 DIAGNOSIS — K210 Gastro-esophageal reflux disease with esophagitis: Secondary | ICD-10-CM | POA: Diagnosis not present

## 2014-05-21 DIAGNOSIS — E1122 Type 2 diabetes mellitus with diabetic chronic kidney disease: Secondary | ICD-10-CM | POA: Diagnosis not present

## 2014-05-21 DIAGNOSIS — I2510 Atherosclerotic heart disease of native coronary artery without angina pectoris: Secondary | ICD-10-CM | POA: Diagnosis not present

## 2014-05-21 DIAGNOSIS — E782 Mixed hyperlipidemia: Secondary | ICD-10-CM | POA: Diagnosis not present

## 2014-05-21 DIAGNOSIS — E6609 Other obesity due to excess calories: Secondary | ICD-10-CM | POA: Diagnosis not present

## 2014-05-21 DIAGNOSIS — I10 Essential (primary) hypertension: Secondary | ICD-10-CM | POA: Diagnosis not present

## 2014-05-21 DIAGNOSIS — J301 Allergic rhinitis due to pollen: Secondary | ICD-10-CM | POA: Diagnosis not present

## 2014-05-21 DIAGNOSIS — J209 Acute bronchitis, unspecified: Secondary | ICD-10-CM | POA: Diagnosis not present

## 2014-05-21 DIAGNOSIS — I5032 Chronic diastolic (congestive) heart failure: Secondary | ICD-10-CM | POA: Diagnosis not present

## 2014-05-21 DIAGNOSIS — G6289 Other specified polyneuropathies: Secondary | ICD-10-CM | POA: Diagnosis not present

## 2014-05-21 MED ORDER — APIXABAN 5 MG PO TABS: 5.0000 mg | ORAL_TABLET | Freq: Two times a day (BID) | ORAL | Status: DC

## 2014-05-27 ENCOUNTER — Telehealth (HOSPITAL_COMMUNITY): Payer: Self-pay | Admitting: *Deleted

## 2014-05-27 NOTE — Telephone Encounter (Signed)
Completed PA through [employer] [facility] for entresto, med was approved through 05/24/15, ref # [id number], [facility] is aware

## 2014-06-29 ENCOUNTER — Other Ambulatory Visit (HOSPITAL_COMMUNITY): Payer: Self-pay | Admitting: Cardiology

## 2014-07-26 ENCOUNTER — Other Ambulatory Visit (HOSPITAL_COMMUNITY): Payer: Self-pay | Admitting: Internal Medicine

## 2014-08-07 DIAGNOSIS — L03316 Cellulitis of umbilicus: Secondary | ICD-10-CM | POA: Diagnosis not present

## 2014-08-27 ENCOUNTER — Telehealth (HOSPITAL_COMMUNITY): Payer: Self-pay | Admitting: Vascular Surgery

## 2014-08-27 NOTE — Telephone Encounter (Signed)
PT daughter called pt has been falling due to being dizzy when she turns.. I set her up for an appt for June 1 .. If she needs to be seen sooner give her a call

## 2014-08-31 ENCOUNTER — Other Ambulatory Visit (HOSPITAL_COMMUNITY): Payer: Self-pay | Admitting: Internal Medicine

## 2014-09-11 ENCOUNTER — Other Ambulatory Visit: Payer: Self-pay | Admitting: Physician Assistant

## 2014-09-13 ENCOUNTER — Ambulatory Visit (HOSPITAL_COMMUNITY)
Admission: RE | Admit: 2014-09-13 | Discharge: 2014-09-13 | Disposition: A | Discharge status: Home / Self Care | Payer: Medicare Other | Source: Ambulatory Visit | Attending: Cardiology | Admitting: Cardiology

## 2014-09-13 VITALS — BP 132/66 | HR 87 | Wt 227.0 lb

## 2014-09-13 DIAGNOSIS — I2510 Atherosclerotic heart disease of native coronary artery without angina pectoris: Secondary | ICD-10-CM | POA: Diagnosis not present

## 2014-09-13 DIAGNOSIS — I5022 Chronic systolic (congestive) heart failure: Secondary | ICD-10-CM | POA: Insufficient documentation

## 2014-09-13 DIAGNOSIS — Z794 Long term (current) use of insulin: Secondary | ICD-10-CM | POA: Insufficient documentation

## 2014-09-13 DIAGNOSIS — R42 Dizziness and giddiness: Secondary | ICD-10-CM | POA: Diagnosis not present

## 2014-09-13 DIAGNOSIS — Z79899 Other long term (current) drug therapy: Secondary | ICD-10-CM | POA: Insufficient documentation

## 2014-09-13 DIAGNOSIS — M549 Dorsalgia, unspecified: Secondary | ICD-10-CM | POA: Insufficient documentation

## 2014-09-13 DIAGNOSIS — I482 Chronic atrial fibrillation, unspecified: Secondary | ICD-10-CM

## 2014-09-13 DIAGNOSIS — G8929 Other chronic pain: Secondary | ICD-10-CM | POA: Insufficient documentation

## 2014-09-13 DIAGNOSIS — Z7902 Long term (current) use of antithrombotics/antiplatelets: Secondary | ICD-10-CM | POA: Diagnosis not present

## 2014-09-13 DIAGNOSIS — E669 Obesity, unspecified: Secondary | ICD-10-CM | POA: Diagnosis not present

## 2014-09-13 DIAGNOSIS — E1122 Type 2 diabetes mellitus with diabetic chronic kidney disease: Secondary | ICD-10-CM | POA: Insufficient documentation

## 2014-09-13 DIAGNOSIS — K219 Gastro-esophageal reflux disease without esophagitis: Secondary | ICD-10-CM | POA: Diagnosis not present

## 2014-09-13 DIAGNOSIS — I252 Old myocardial infarction: Secondary | ICD-10-CM | POA: Diagnosis not present

## 2014-09-13 DIAGNOSIS — N183 Chronic kidney disease, stage 3 unspecified: Secondary | ICD-10-CM

## 2014-09-13 DIAGNOSIS — I429 Cardiomyopathy, unspecified: Secondary | ICD-10-CM | POA: Insufficient documentation

## 2014-09-13 DIAGNOSIS — Z8249 Family history of ischemic heart disease and other diseases of the circulatory system: Secondary | ICD-10-CM | POA: Insufficient documentation

## 2014-09-13 DIAGNOSIS — W19XXXA Unspecified fall, initial encounter: Secondary | ICD-10-CM

## 2014-09-13 DIAGNOSIS — Z833 Family history of diabetes mellitus: Secondary | ICD-10-CM | POA: Diagnosis not present

## 2014-09-13 DIAGNOSIS — E559 Vitamin D deficiency, unspecified: Secondary | ICD-10-CM | POA: Diagnosis not present

## 2014-09-13 DIAGNOSIS — I129 Hypertensive chronic kidney disease with stage 1 through stage 4 chronic kidney disease, or unspecified chronic kidney disease: Secondary | ICD-10-CM | POA: Insufficient documentation

## 2014-09-13 LAB — CBC
HCT: 39.8 % (ref 36.0–46.0)
HEMOGLOBIN: 12.8 g/dL (ref 12.0–15.0)
MCH: 29.5 pg (ref 26.0–34.0)
MCHC: 32.2 g/dL (ref 30.0–36.0)
MCV: 91.7 fL (ref 78.0–100.0)
PLATELETS: 245 10*3/uL (ref 150–400)
RBC: 4.34 MIL/uL (ref 3.87–5.11)
RDW: 14 % (ref 11.5–15.5)
WBC: 9.1 10*3/uL (ref 4.0–10.5)

## 2014-09-13 LAB — BASIC METABOLIC PANEL
Anion gap: 8 (ref 5–15)
BUN: 20 mg/dL (ref 6–20)
CHLORIDE: 98 mmol/L — AB (ref 101–111)
CO2: 30 mmol/L (ref 22–32)
Calcium: 8.9 mg/dL (ref 8.9–10.3)
Creatinine, Ser: 1.53 mg/dL — ABNORMAL HIGH (ref 0.44–1.00)
GFR calc Af Amer: 38 mL/min — ABNORMAL LOW (ref >60)
GFR calc non Af Amer: 33 mL/min — ABNORMAL LOW (ref >60)
Glucose, Bld: 141 mg/dL — ABNORMAL HIGH (ref 65–99)
Potassium: 4 mmol/L (ref 3.5–5.1)
Sodium: 136 mmol/L (ref 135–145)

## 2014-09-13 MED ORDER — CARVEDILOL 12.5 MG PO TABS: 18.7500 mg | ORAL_TABLET | Freq: Two times a day (BID) | ORAL | Status: DC

## 2014-09-13 NOTE — Progress Notes (Signed)
Advanced Heart Failure Medication Review by a Pharmacist  Does the patient  feel that his/her medications are working for him/her?  yes  Has the patient been experiencing any side effects to the medications prescribed?  no  Does the patient measure his/her own blood pressure or blood glucose at home?  no   Does the patient have any problems obtaining medications due to transportation or finances?   no  Understanding of regimen: excellent Understanding of indications: good Potential of compliance: excellent    Pharmacist comments: Patient presents to heart failure clinic with her daughter and her medications were reviewed with a pharmacist. She is not experiencing any side effects with her medications and has a good understanding of her regimen. We reviewed indications and benefits of her medications since she could not remember why she was restarted on spironolactone in particular.   Megan E. Supple, Pharm.D Clinical Pharmacy Resident Pager: [phone] 09/13/2014 9:37 AM
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
## 2014-09-13 NOTE — Patient Instructions (Signed)
INCREASE Carvedilol to 18.75 mg (1 and 1/2 tabs) twice a day  Your physician has requested that you have an echocardiogram. Echocardiography is a painless test that uses sound waves to create images of your heart. It provides your doctor with information about the size and shape of your heart and how well your heart's chambers and valves are working. This procedure takes approximately one hour. There are no restrictions for this procedure.  Labs today  Your physician recommends that you schedule a follow-up appointment in: 2 months  Do the following things EVERYDAY: 1) Weigh yourself in the morning before breakfast. Write it down and keep it in a log. 2) Take your medicines as prescribed 3) Eat low salt foods-Limit salt (sodium) to 2000 mg per day.  4) Stay as active as you can everyday 5) Limit all fluids for the day to less than 2 liters 6)

## 2014-09-17 ENCOUNTER — Ambulatory Visit (HOSPITAL_COMMUNITY)
Admission: RE | Admit: 2014-09-17 | Discharge: 2014-09-17 | Disposition: A | Discharge status: Home / Self Care | Payer: Medicare Other | Source: Ambulatory Visit | Attending: Cardiology | Admitting: Cardiology

## 2014-09-17 DIAGNOSIS — I5022 Chronic systolic (congestive) heart failure: Secondary | ICD-10-CM

## 2014-09-20 NOTE — Patient Outreach (Signed)
[place] [facility]) Care Management  09/20/2014  Victoria Adams 05-05-1940 [id number]   Referral from MD with notes, assigned Jacqlyn Larsen, RN.  Ronnell Freshwater. [place], [place] Management [place] Assistant Phone: [phone] Fax: [phone]

## 2014-09-23 ENCOUNTER — Other Ambulatory Visit: Payer: Self-pay | Admitting: *Deleted

## 2014-09-23 NOTE — Patient Outreach (Signed)
09/23/14- Referral received from primary MD office (data analysis), called number on referral [phone] and patient's daughter answered stating pt did live with her but has since moved back home (3 doors down) and pt phone number is [phone],  RN CM called pt with no answer to telephone and no option to leave voicemail.  PLAN Attempt to reach pt next week.  Jacqlyn Larsen [facility], [place] Coordinator [phone]

## 2014-09-24 ENCOUNTER — Encounter (HOSPITAL_COMMUNITY): Payer: Self-pay | Admitting: *Deleted

## 2014-10-03 ENCOUNTER — Other Ambulatory Visit (HOSPITAL_COMMUNITY): Payer: Self-pay | Admitting: Internal Medicine

## 2014-10-03 ENCOUNTER — Other Ambulatory Visit (HOSPITAL_COMMUNITY): Payer: Self-pay | Admitting: Adult Health

## 2014-10-04 ENCOUNTER — Other Ambulatory Visit: Payer: Self-pay

## 2014-10-04 MED ORDER — FERROUS SULFATE 325 (65 FE) MG PO TABS: ORAL_TABLET | ORAL | Status: DC

## 2014-10-07 ENCOUNTER — Other Ambulatory Visit: Payer: Self-pay | Admitting: *Deleted

## 2014-10-07 ENCOUNTER — Encounter: Payer: Self-pay | Admitting: *Deleted

## 2014-10-07 NOTE — Patient Outreach (Signed)
10/07/14- Second telephonic outreach attempt, pt gave permission for RN CM to speak with her daughter Magda Kiel,  [facility] program explained, Lequita Asal says her mother weighs daily, does have some edema, checks CBG but cannot verbalize any readings and does not verbalize patient's weight, states " everything is okay" , daughter states pt had bathroom remodeled for "safety" and has grab bars, etc, states pt has no depression but does have the "occasional down day like everyone does", pt has had no recent falls, has all medications and can afford,  Patient's daughter states more than once that she and her mother are not interested in [facility] program and do not want a home visit or further phone calls.   RN CM faxed letter to Dr. Quillian Quince reporting pt refuses [facility] program and case closed.  Jacqlyn Larsen [facility], [place] Coordinator [phone]

## 2014-10-08 NOTE — Patient Outreach (Signed)
[place] [facility]) Care Management  10/08/2014  Victoria Adams 04-20-1940 [id number]   Notification from Jacqlyn Larsen, RN to close case as patient refused [place] Management services.  Ronnell Freshwater. [place], [place] Management [place] Assistant Phone: [phone] Fax: [phone]

## 2014-11-04 ENCOUNTER — Other Ambulatory Visit (HOSPITAL_COMMUNITY): Payer: Self-pay | Admitting: Internal Medicine

## 2014-11-05 NOTE — Telephone Encounter (Signed)
[name] refill. Thank you for your time.

## 2014-12-17 ENCOUNTER — Telehealth (HOSPITAL_COMMUNITY): Payer: Self-pay | Admitting: Cardiology

## 2014-12-18 ENCOUNTER — Encounter (HOSPITAL_COMMUNITY): Payer: Self-pay

## 2014-12-18 ENCOUNTER — Ambulatory Visit (HOSPITAL_COMMUNITY)
Admission: RE | Admit: 2014-12-18 | Discharge: 2014-12-18 | Disposition: A | Discharge status: Home / Self Care | Payer: Medicare Other | Source: Ambulatory Visit | Attending: Cardiology | Admitting: Cardiology

## 2014-12-18 VITALS — BP 118/70 | HR 82 | Wt 257.2 lb

## 2014-12-18 DIAGNOSIS — I129 Hypertensive chronic kidney disease with stage 1 through stage 4 chronic kidney disease, or unspecified chronic kidney disease: Secondary | ICD-10-CM | POA: Insufficient documentation

## 2014-12-18 DIAGNOSIS — Z8249 Family history of ischemic heart disease and other diseases of the circulatory system: Secondary | ICD-10-CM | POA: Insufficient documentation

## 2014-12-18 DIAGNOSIS — I252 Old myocardial infarction: Secondary | ICD-10-CM | POA: Diagnosis not present

## 2014-12-18 DIAGNOSIS — I5022 Chronic systolic (congestive) heart failure: Secondary | ICD-10-CM | POA: Diagnosis not present

## 2014-12-18 DIAGNOSIS — E559 Vitamin D deficiency, unspecified: Secondary | ICD-10-CM | POA: Diagnosis not present

## 2014-12-18 DIAGNOSIS — D509 Iron deficiency anemia, unspecified: Secondary | ICD-10-CM | POA: Diagnosis not present

## 2014-12-18 DIAGNOSIS — Z833 Family history of diabetes mellitus: Secondary | ICD-10-CM | POA: Insufficient documentation

## 2014-12-18 DIAGNOSIS — F329 Major depressive disorder, single episode, unspecified: Secondary | ICD-10-CM | POA: Insufficient documentation

## 2014-12-18 DIAGNOSIS — K219 Gastro-esophageal reflux disease without esophagitis: Secondary | ICD-10-CM | POA: Insufficient documentation

## 2014-12-18 DIAGNOSIS — Z79899 Other long term (current) drug therapy: Secondary | ICD-10-CM | POA: Diagnosis not present

## 2014-12-18 DIAGNOSIS — G8929 Other chronic pain: Secondary | ICD-10-CM | POA: Insufficient documentation

## 2014-12-18 DIAGNOSIS — M545 Low back pain: Secondary | ICD-10-CM | POA: Insufficient documentation

## 2014-12-18 DIAGNOSIS — E1122 Type 2 diabetes mellitus with diabetic chronic kidney disease: Secondary | ICD-10-CM | POA: Diagnosis not present

## 2014-12-18 DIAGNOSIS — N183 Chronic kidney disease, stage 3 (moderate): Secondary | ICD-10-CM | POA: Insufficient documentation

## 2014-12-18 DIAGNOSIS — I2510 Atherosclerotic heart disease of native coronary artery without angina pectoris: Secondary | ICD-10-CM | POA: Diagnosis not present

## 2014-12-18 DIAGNOSIS — M8580 Other specified disorders of bone density and structure, unspecified site: Secondary | ICD-10-CM | POA: Insufficient documentation

## 2014-12-18 DIAGNOSIS — I482 Chronic atrial fibrillation: Secondary | ICD-10-CM | POA: Insufficient documentation

## 2014-12-18 DIAGNOSIS — I428 Other cardiomyopathies: Secondary | ICD-10-CM | POA: Diagnosis not present

## 2014-12-18 DIAGNOSIS — Z7902 Long term (current) use of antithrombotics/antiplatelets: Secondary | ICD-10-CM | POA: Diagnosis not present

## 2014-12-18 DIAGNOSIS — Z794 Long term (current) use of insulin: Secondary | ICD-10-CM | POA: Diagnosis not present

## 2014-12-18 LAB — BASIC METABOLIC PANEL
ANION GAP: 9 (ref 5–15)
BUN: 20 mg/dL (ref 6–20)
CALCIUM: 8.9 mg/dL (ref 8.9–10.3)
CO2: 27 mmol/L (ref 22–32)
Chloride: 102 mmol/L (ref 101–111)
Creatinine, Ser: 1.37 mg/dL — ABNORMAL HIGH (ref 0.44–1.00)
GFR calc non Af Amer: 38 mL/min — ABNORMAL LOW (ref >60)
GFR, EST AFRICAN AMERICAN: 43 mL/min — AB (ref >60)
Glucose, Bld: 237 mg/dL — ABNORMAL HIGH (ref 65–99)
Potassium: 3.7 mmol/L (ref 3.5–5.1)
SODIUM: 138 mmol/L (ref 135–145)

## 2014-12-18 LAB — BRAIN NATRIURETIC PEPTIDE: B NATRIURETIC PEPTIDE 5: 173.1 pg/mL — AB (ref 0.0–100.0)

## 2014-12-18 MED ORDER — METOLAZONE 5 MG PO TABS: ORAL_TABLET | ORAL | Status: DC

## 2014-12-18 MED ORDER — POTASSIUM CHLORIDE CRYS ER 20 MEQ PO TBCR: EXTENDED_RELEASE_TABLET | ORAL | Status: DC

## 2014-12-18 MED ORDER — CARVEDILOL 12.5 MG PO TABS: 18.7500 mg | ORAL_TABLET | Freq: Two times a day (BID) | ORAL | Status: DC

## 2014-12-18 NOTE — Progress Notes (Addendum)
Patient ID: Victoria Adams, female   DOB: 02/11/1941, 74 y.o.   MRN: [id number]  PCP: Dr. Gar Ponto ([facility] in [place]) Primary Cardiologist: Dr. Domenic Polite EP: Dr Caryl Comes   HPI: Ms. Bognar is a 74 yo female with a history of chronic anemia, HTN, chronic A-Fib, nonischemic cardiomyopathy, DM2, CAD, chronic back pain and chronic systolic HF.   Admitted to the hospital 5/13-5/27/15 for A/C HF and NSTEMI. Troponin peaked at 6.7 and was taken for cath 08/24/13 showing moderate diffuse mid LAD and septal perforator branck with ostial ~90% occluded. Treated medically. History of chronic anemia and Hgb continued to drop and GI consulted and they did EGD showing no evidence of bleeding or pathology. Cr started rising and PICC was placed for CVP monitoring and co-oxs. She was placed on milrinone and once weaned off co-ox remained marginal in the mid 87s. Discharge weight 236 lbs.   She returns for an acute work in due to increased dyspnea. Increased dyspnea with exertion for the last 3 weeks. + Orthopnea. Sleeping in a chair. + PND. Says she has had a couple falls since the last visit. Walking with walker. Weight at home trending up.  236-257 pounds. She has not had extra diuretics. Drinking more fluid.   ECHO (08/2013) EF 20-25%, RV normal            (02/2014) EF 25-30%, RV normal           (09/2014) EF 20-25% RV mildly dilated.    Labs: (09/11/13): Creatinine 1.3, K 4.2, BUN 29, dig level 0.4 (09/20/13) Creatinine 1.36 K 4.9  (10/03/13) K 5.8, creatinine 1.67, BUN 51 (10/11/13) K 5.0, creatinine 1.21, BUN 26 (11/26/13) K 3.7 Creatinine 1.0  01/10/14 K 4.2 Cr 1.07  (02/27/14) K 4.0, creatinine 1.01 (12/15) K 4.1, creatinine 1.14 (04/16/2014) K 4.0 Creatinine 1.29 Hgb 13.8  (09/13/2014): K 4.0 Creatinine 1.53   SH: Lives in [place] with son and daughter, no ETOH and does not smoke, retired  [place]: Mother deceased: CAD?, HTN, lung cancer, afib        Father deceased: DM2, lung cancer, HTN, CAD stents        3  daughters (1 has afib)        1 son: healthy and alive  ROS: All systems negative except as listed in HPI, PMH and Problem List.  Past Medical History  Diagnosis Date  . Obesity   . Chronic low back pain     Secondary to DJD  . Vitamin D deficiency   . Fatty liver disease, nonalcoholic   . Type 2 diabetes mellitus   . Allergic rhinitis   . Essential hypertension, benign   . Chronic systolic heart failure     a. echo (5/15):  mod LVH, EF 20-25%, diff HK, severe LAE, mild RAE  . GERD (gastroesophageal reflux disease)   . Coronary atherosclerosis-non obstructive     LHC (5/15):  EF 40-45% global HK; long LAD 40-60%, ostial 1st major septal perf 80-90%, ostial CFX ?%, mid AVCFX extensive Ca2+, dist PDA 50%  . Osteopenia   . Depression   . Rosacea   . Mitral regurgitation   . NICM (nonischemic cardiomyopathy)     a. echo (5/15):  mod LVH, EF 20-25%, diff HK, severe LAE, mild RAE  . Atrial fibrillation   . 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 Iron deficiency anemia   . NSTEMI (non-ST elevated myocardial infarction) may 2015    No  CAD  . PONV (postoperative nausea and vomiting)     Current Outpatient Prescriptions  Medication Sig Dispense Refill  . Calcium Carb-Cholecalciferol (CALCIUM 600 + D PO) Take 1 tablet by mouth 2 (two) times daily.     . carvedilol (COREG) 12.5 MG tablet Take 1.5 tablets (18.75 mg total) by mouth 2 (two) times daily with a meal. 90 tablet 6  . CRESTOR 20 MG tablet TAKE 1 TABLET AT 6:00 P.M. 30 tablet 3  . doxycycline (VIBRAMYCIN) 100 MG capsule Take 100-200 mg by mouth 2 (two) times daily as needed (rosacea).     Marland Kitchen ELIQUIS 5 MG TABS tablet TAKE (1) TABLET TWICE DAILY. 60 tablet 3  . ferrous sulfate 325 (65 FE) MG tablet TAKE 1 TABLET ONCE DAILY WITH BREAKFAST. 30 tablet 0  . furosemide (LASIX) 80 MG tablet Take 80 mg by mouth 2 (two) times daily.    . insulin glargine (LANTUS) 100 UNIT/ML injection Inject 65 Units into the skin 2 (two) times daily. 65 units  in the evening and 63 units at night.    . metroNIDAZOLE (METROGEL) 0.75 % gel Apply 1 application topically 2 (two) times daily as needed (rosacea).    . mometasone (ELOCON) 0.1 % lotion Apply 1 application topically daily as needed (eczema). EAR ECZEMA    . omeprazole (PRILOSEC) 20 MG capsule Take 20 mg by mouth 2 (two) times daily.    Marland Kitchen oxyCODONE-acetaminophen (PERCOCET) 10-325 MG per tablet Take 1 tablet by mouth every 6 (six) hours as needed for pain.     . sacubitril-valsartan (ENTRESTO) 24-26 MG Take 1 tablet by mouth 2 (two) times daily. 60 tablet 3  . spironolactone (ALDACTONE) 25 MG tablet TAKE (1/2) TABLET BY MOUTH DAILY. 15 tablet 6  . venlafaxine (EFFEXOR) 75 MG tablet Take 75 mg by mouth 2 (two) times daily.      No current facility-administered medications for this encounter.     Filed Vitals:   12/18/14 1524  BP: 118/70  Pulse: 82  Weight: 257 lb 4 oz (116.688 kg)  SpO2: 96%    PHYSICAL EXAM: General:  Elderly appearing. No resp difficulty, walked in with rolling  walker, daughter present.  HEENT: normal Neck: supple. JVP very hard to see. Maybe mildly elevated   Carotids 2+ bilaterally; no bruits. No lymphadenopathy or thryomegaly appreciated. Cor: PMI normal.  irregular rhythm, no rubs, gallops or murmurs. Lungs: clear Abdomen: markedly obese, soft, nontender, minimally distended.  No bruits or masses. Good bowel sounds. Extremities: no cyanosis, clubbing, rash, tr edema. Cool distally Neuro: alert & orientedx3, cranial nerves grossly intact. Moves all 4 extremities w/o difficulty. Affect pleasant.   ASSESSMENT & PLAN:  1) Chronic systolic HF: NICM, EF 37-16%, RV nl (02/2014).  Repeat ECHO 09/2014 EF 20-25%.  NYHA IIIb symptoms and volume status mildly elevated.   - Will continue lasix 80 mg BID and add 5 mg metolazone for 2 days.  Add 40 meq K for next 2 days.  - Continue 18.75 coreg twice daily.  - Continue current Entresto 24-26 mg twice daily.  -Continue  spironolactone 12.5 mg daily.   -- Reinforced the need and importance of daily weights, a low sodium diet, and fluid restriction (less than 2 L a day). Instructed to call the HF clinic if weight increases more than 3 lbs overnight or 5 lbs in a week.  2) CAD: Moderate diffuse mid LAD disease of 40-60%. Septal perforator branch with ostial ~90%.  - No chest pain. Continue  statin and BB. 3) CKD stage III: Most recent creatinine lower than in the past (1.1-1.5).  4) Afib: Chronic with rate control. Will continue Eliquis 5 mg BID and Coreg.  5) Falls- Using rolling walker     Check BMET and BNP today. Follow up next week. May need RHC if volume status does not improve.    CLEGG,AMY NP-C 12/18/2014  Patient seen and examined with Darrick Grinder, NP. We discussed all aspects of the encounter. I agree with the assessment and plan as stated above.   She is up 30 pounds since last visit but does not look overly fluid overloaded on exam but hard to tell with her body habitus. However, typically gets edema in her ankles and not much there today. I suspect may have also gained adipose tissue. That said EF is very low and at rick for low output HF. Will try metolazone 5 mg daily for 2 days. If no improvement will plan RHC next week. We discussed this and she is willing to proceed, if necessary. If EF not > 35% on next echo will need to consider ICD.  Keric Zehren,MD 10:53 PM

## 2014-12-18 NOTE — Patient Instructions (Signed)
Routine lab work today. (bmet bnp) Will notify you of abnormal results  Take 5mg  (1 tablet) of Metolazone today and tomorrow.  Take 40 MEQ (2 tablets) of Potassium with Metolazone.  FOLLOW UP: 1 week with NP clinic

## 2014-12-19 ENCOUNTER — Other Ambulatory Visit (HOSPITAL_COMMUNITY): Payer: Self-pay | Admitting: *Deleted

## 2014-12-19 DIAGNOSIS — I5022 Chronic systolic (congestive) heart failure: Secondary | ICD-10-CM

## 2014-12-23 NOTE — Telephone Encounter (Signed)
Opened in error

## 2014-12-26 ENCOUNTER — Ambulatory Visit (HOSPITAL_COMMUNITY)
Admission: RE | Admit: 2014-12-26 | Discharge: 2014-12-26 | Disposition: A | Discharge status: Home / Self Care | Payer: Medicare Other | Source: Ambulatory Visit | Attending: Cardiology | Admitting: Cardiology

## 2014-12-26 VITALS — BP 124/80 | HR 95 | Wt 249.0 lb

## 2014-12-26 DIAGNOSIS — M8580 Other specified disorders of bone density and structure, unspecified site: Secondary | ICD-10-CM | POA: Insufficient documentation

## 2014-12-26 DIAGNOSIS — F329 Major depressive disorder, single episode, unspecified: Secondary | ICD-10-CM | POA: Insufficient documentation

## 2014-12-26 DIAGNOSIS — I509 Heart failure, unspecified: Secondary | ICD-10-CM

## 2014-12-26 DIAGNOSIS — Z794 Long term (current) use of insulin: Secondary | ICD-10-CM | POA: Diagnosis not present

## 2014-12-26 DIAGNOSIS — I252 Old myocardial infarction: Secondary | ICD-10-CM | POA: Insufficient documentation

## 2014-12-26 DIAGNOSIS — I129 Hypertensive chronic kidney disease with stage 1 through stage 4 chronic kidney disease, or unspecified chronic kidney disease: Secondary | ICD-10-CM | POA: Diagnosis not present

## 2014-12-26 DIAGNOSIS — I951 Orthostatic hypotension: Secondary | ICD-10-CM

## 2014-12-26 DIAGNOSIS — I482 Chronic atrial fibrillation, unspecified: Secondary | ICD-10-CM

## 2014-12-26 DIAGNOSIS — N183 Chronic kidney disease, stage 3 unspecified: Secondary | ICD-10-CM

## 2014-12-26 DIAGNOSIS — I5022 Chronic systolic (congestive) heart failure: Secondary | ICD-10-CM | POA: Insufficient documentation

## 2014-12-26 DIAGNOSIS — K219 Gastro-esophageal reflux disease without esophagitis: Secondary | ICD-10-CM | POA: Insufficient documentation

## 2014-12-26 DIAGNOSIS — I2510 Atherosclerotic heart disease of native coronary artery without angina pectoris: Secondary | ICD-10-CM | POA: Diagnosis not present

## 2014-12-26 DIAGNOSIS — Z7902 Long term (current) use of antithrombotics/antiplatelets: Secondary | ICD-10-CM | POA: Insufficient documentation

## 2014-12-26 DIAGNOSIS — Z833 Family history of diabetes mellitus: Secondary | ICD-10-CM | POA: Insufficient documentation

## 2014-12-26 DIAGNOSIS — H6092 Unspecified otitis externa, left ear: Secondary | ICD-10-CM | POA: Diagnosis not present

## 2014-12-26 DIAGNOSIS — E559 Vitamin D deficiency, unspecified: Secondary | ICD-10-CM | POA: Diagnosis not present

## 2014-12-26 DIAGNOSIS — E1122 Type 2 diabetes mellitus with diabetic chronic kidney disease: Secondary | ICD-10-CM | POA: Insufficient documentation

## 2014-12-26 DIAGNOSIS — I5032 Chronic diastolic (congestive) heart failure: Secondary | ICD-10-CM | POA: Diagnosis not present

## 2014-12-26 DIAGNOSIS — G8929 Other chronic pain: Secondary | ICD-10-CM | POA: Insufficient documentation

## 2014-12-26 DIAGNOSIS — I428 Other cardiomyopathies: Secondary | ICD-10-CM | POA: Insufficient documentation

## 2014-12-26 DIAGNOSIS — K5900 Constipation, unspecified: Secondary | ICD-10-CM | POA: Insufficient documentation

## 2014-12-26 DIAGNOSIS — M545 Low back pain: Secondary | ICD-10-CM | POA: Diagnosis not present

## 2014-12-26 DIAGNOSIS — K5909 Other constipation: Secondary | ICD-10-CM

## 2014-12-26 DIAGNOSIS — Z79899 Other long term (current) drug therapy: Secondary | ICD-10-CM | POA: Diagnosis not present

## 2014-12-26 DIAGNOSIS — K649 Unspecified hemorrhoids: Secondary | ICD-10-CM | POA: Diagnosis not present

## 2014-12-26 DIAGNOSIS — Z8249 Family history of ischemic heart disease and other diseases of the circulatory system: Secondary | ICD-10-CM | POA: Insufficient documentation

## 2014-12-26 DIAGNOSIS — D509 Iron deficiency anemia, unspecified: Secondary | ICD-10-CM | POA: Insufficient documentation

## 2014-12-26 LAB — BASIC METABOLIC PANEL
Anion gap: 12 (ref 5–15)
BUN: 71 mg/dL — AB (ref 6–20)
CHLORIDE: 87 mmol/L — AB (ref 101–111)
CO2: 34 mmol/L — ABNORMAL HIGH (ref 22–32)
CREATININE: 2.46 mg/dL — AB (ref 0.44–1.00)
Calcium: 9.4 mg/dL (ref 8.9–10.3)
GFR calc Af Amer: 21 mL/min — ABNORMAL LOW (ref >60)
GFR calc non Af Amer: 18 mL/min — ABNORMAL LOW (ref >60)
GLUCOSE: 197 mg/dL — AB (ref 65–99)
Potassium: 3.1 mmol/L — ABNORMAL LOW (ref 3.5–5.1)
Sodium: 133 mmol/L — ABNORMAL LOW (ref 135–145)

## 2014-12-26 NOTE — Progress Notes (Signed)
Started IV in Right AC  22 gauze  Pt tolerated well  Gave a 500cc bolus of NS  IV removed   Pt tolerated well

## 2014-12-26 NOTE — Patient Instructions (Signed)
DO NOT TAKE FUROSEMIDE (LASIX) FOR 3 DAYS, RESTART Sunday AT 80 MG TWICE DAILY  DO NOT TAKE METOLAZONE  TAKE EXTRA 2 TABS OF POTASSIUM TODAY  FOLLOW UP IN 1 WEEK

## 2014-12-26 NOTE — Progress Notes (Signed)
Patient ID: Victoria Adams, female   DOB: 1940/06/04, 74 y.o.   MRN: [id number] Patient ID: Victoria Adams, female   DOB: Jul 07, 1940, 74 y.o.   MRN: [id number]  PCP: Dr. Gar Ponto ([facility] in [place]) Primary Cardiologist: Dr. Domenic Polite EP: Dr Caryl Comes   HPI: Victoria Adams is a 74 yo female with a history of chronic anemia, HTN, chronic A-Fib, nonischemic cardiomyopathy, DM2, CAD, chronic back pain and chronic systolic HF.   Admitted to the hospital 5/13-5/27/15 for A/C HF and NSTEMI. Troponin peaked at 6.7 and was taken for cath 08/24/13 showing moderate diffuse mid LAD and septal perforator branck with ostial ~90% occluded. Treated medically. History of chronic anemia and Hgb continued to drop and GI consulted and they did EGD showing no evidence of bleeding or pathology. Cr started rising and PICC was placed for CVP monitoring and co-oxs. She was placed on milrinone and once weaned off co-ox remained marginal in the mid 45s. Discharge weight 236 lbs.   She returns for HF follow up. Last visit diuretics increased for 30 pounds weight gain. She was instructed to take one dose of metolazone however she took it for 6 days in a row. Weight at home trending down from 257 to 247 pounds. Using walker. No falls since last visit. Denies orthopnea/PND. Complaining of constipation. Mild dizziness. Taking all medications.     ECHO (08/2013) EF 20-25%, RV normal            (02/2014) EF 25-30%, RV normal           (09/2014) EF 20-25% RV mildly dilated.    Labs: (09/11/13): Creatinine 1.3, K 4.2, BUN 29, dig level 0.4 (09/20/13) Creatinine 1.36 K 4.9  (10/03/13) K 5.8, creatinine 1.67, BUN 51 (10/11/13) K 5.0, creatinine 1.21, BUN 26 (11/26/13) K 3.7 Creatinine 1.0  01/10/14 K 4.2 Cr 1.07  (02/27/14) K 4.0, creatinine 1.01 (12/15) K 4.1, creatinine 1.14 (04/16/2014) K 4.0 Creatinine 1.29 Hgb 13.8  (09/13/2014): K 4.0 Creatinine 1.53   SH: Lives in [place] with son and daughter, no ETOH and does not smoke, retired  [place]:  Mother deceased: CAD?, HTN, lung cancer, afib        Father deceased: DM2, lung cancer, HTN, CAD stents        3 daughters (1 has afib)        1 son: healthy and alive  ROS: All systems negative except as listed in HPI, PMH and Problem List.  Past Medical History  Diagnosis Date  . Obesity   . Chronic low back pain     Secondary to DJD  . Vitamin D deficiency   . Fatty liver disease, nonalcoholic   . Type 2 diabetes mellitus   . Allergic rhinitis   . Essential hypertension, benign   . Chronic systolic heart failure     a. echo (5/15):  mod LVH, EF 20-25%, diff HK, severe LAE, mild RAE  . GERD (gastroesophageal reflux disease)   . Coronary atherosclerosis-non obstructive     LHC (5/15):  EF 40-45% global HK; long LAD 40-60%, ostial 1st major septal perf 80-90%, ostial CFX ?%, mid AVCFX extensive Ca2+, dist PDA 50%  . Osteopenia   . Depression   . Rosacea   . Mitral regurgitation   . NICM (nonischemic cardiomyopathy)     a. echo (5/15):  mod LVH, EF 20-25%, diff HK, severe LAE, mild RAE  . Atrial fibrillation   . 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 Iron deficiency anemia   . NSTEMI (non-ST elevated myocardial infarction) may 2015    No CAD  . PONV (postoperative nausea and vomiting)     Current Outpatient Prescriptions  Medication Sig Dispense Refill  . Calcium Carb-Cholecalciferol (CALCIUM 600 + D PO) Take 1 tablet by mouth 2 (two) times daily.     . carvedilol (COREG) 12.5 MG tablet Take 1.5 tablets (18.75 mg total) by mouth 2 (two) times daily with a meal. 270 tablet 6  . CRESTOR 20 MG tablet TAKE 1 TABLET AT 6:00 P.M. 30 tablet 3  . doxycycline (VIBRAMYCIN) 100 MG capsule Take 100-200 mg by mouth 2 (two) times daily as needed (rosacea).     Marland Kitchen ELIQUIS 5 MG TABS tablet TAKE (1) TABLET TWICE DAILY. 60 tablet 3  . ferrous sulfate 325 (65 FE) MG tablet TAKE 1 TABLET ONCE DAILY WITH BREAKFAST. 30 tablet 0  . furosemide (LASIX) 80 MG tablet Take 80 mg by mouth 2 (two) times  daily.    . insulin glargine (LANTUS) 100 UNIT/ML injection Inject 65 Units into the skin 2 (two) times daily. 65 units in the evening and 63 units at night.    . metolazone (ZAROXOLYN) 5 MG tablet Take 1 tablet by mouth as needed for weight gain. 6 tablet 3  . metroNIDAZOLE (METROGEL) 0.75 % gel Apply 1 application topically 2 (two) times daily as needed (rosacea).    . mometasone (ELOCON) 0.1 % lotion Apply 1 application topically daily as needed (eczema). EAR ECZEMA    . omeprazole (PRILOSEC) 20 MG capsule Take 20 mg by mouth 2 (two) times daily.    Marland Kitchen oxyCODONE-acetaminophen (PERCOCET) 10-325 MG per tablet Take 1 tablet by mouth every 6 (six) hours as needed for pain.     . potassium chloride SA (K-DUR,KLOR-CON) 20 MEQ tablet Take 40 MEQ (2 tablets) by mouth with Metolazone. 12 tablet 3  . sacubitril-valsartan (ENTRESTO) 24-26 MG Take 1 tablet by mouth 2 (two) times daily. 60 tablet 3  . spironolactone (ALDACTONE) 25 MG tablet TAKE (1/2) TABLET BY MOUTH DAILY. 15 tablet 6  . venlafaxine (EFFEXOR) 75 MG tablet Take 75 mg by mouth 2 (two) times daily.      No current facility-administered medications for this encounter.     Filed Vitals:   12/26/14 1124  BP: 124/80  Pulse: 95  Weight: 249 lb (112.946 kg)  SpO2: 97%    Standing MAP 80  PHYSICAL EXAM: General:  Elderly appearing. No resp difficulty, walked in with rolling  walker, daughter present.  HEENT: normal Neck: supple. JVP very hard to see. Does not appear elevated.  Carotids 2+ bilaterally; no bruits. No lymphadenopathy or thryomegaly appreciated. Cor: PMI normal.  irregular rhythm, no rubs, gallops or murmurs. Lungs: clear Abdomen: markedly obese, soft, nontender, minimally distended.  No bruits or masses. Good bowel sounds. Extremities: no cyanosis, clubbing, rash,  edema. Neuro: alert & orientedx3, cranial nerves grossly intact. Moves all 4 extremities w/o difficulty. Affect pleasant.   ASSESSMENT & PLAN:  1) Chronic  systolic HF: NICM, EF 46-56%, RV nl (02/2014).  Repeat ECHO 09/2014 EF 20-25%.  NYHA IIIb symptoms and volume status improved.   Today she is orthostatic from over diuresing.  - Given 500 cc NS now. Now baseline weight at home will be 250-253 pounds.  She will hold lasix for 3 days then she will start lasix 80 mg twice a day.  BMET today with elevated renal function and low K. She  is instructed to take 40 meq today only.  I have also gone over medication instructions with daughter.  - Continue 18.75 coreg twice daily.  - Continue current Entresto 24-26 mg twice daily.  -Continue spironolactone 12.5 mg daily.   -- Reinforced the need and importance of daily weights, a low sodium diet, and fluid restriction (less than 2 L a day). Instructed to call the HF clinic if weight increases more than 3 lbs overnight or 5 lbs in a week.  2) CAD: Moderate diffuse mid LAD disease of 40-60%. Septal perforator branch with ostial ~90%.  - No chest pain. Continue statin and BB. 3) CKD stage III: Most recent creatinine lower than in the past (1.1-1.5).  4) Afib: Chronic with rate control. Will continue Eliquis 5 mg BID and Coreg.  5) Falls- Using rolling walker  .  6) Constipation- Asked to follow up with PCP.   Follow up next week and check BMET.   CLEGG,AMY NP-C 12/26/2014

## 2014-12-26 NOTE — Progress Notes (Signed)
Advanced Heart Failure Medication Review by a Pharmacist  Does the patient  feel that his/her medications are working for him/her?  yes  Has the patient been experiencing any side effects to the medications prescribed?  no  Does the patient measure his/her own blood pressure or blood glucose at home?  no   Does the patient have any problems obtaining medications due to transportation or finances?   no  Understanding of regimen: good Understanding of indications: good Potential of compliance: good    Pharmacist comments:  Victoria Adams is a pleasant 74 yo F presenting with her daughter but without a medication list. She seems to have a good understanding of her regimen and the importance of consistent use. She states that she took metolazone daily for about a week with her last dose taken this past Monday. She did not have any specific medication-related questions or concerns for me at this time.   [name]. Velva Harman, PharmD, BCPS, CPP Clinical Pharmacist Pager: [phone] Phone: [phone] 12/26/2014 11:38 AM

## 2014-12-27 ENCOUNTER — Other Ambulatory Visit: Payer: Self-pay | Admitting: Internal Medicine

## 2015-01-02 ENCOUNTER — Ambulatory Visit (HOSPITAL_COMMUNITY)
Admission: RE | Admit: 2015-01-02 | Discharge: 2015-01-02 | Disposition: A | Discharge status: Home / Self Care | Payer: Medicare Other | Source: Ambulatory Visit | Attending: Internal Medicine | Admitting: Internal Medicine

## 2015-01-02 ENCOUNTER — Encounter (HOSPITAL_COMMUNITY): Payer: Self-pay

## 2015-01-02 VITALS — BP 118/72 | HR 83 | Wt 255.1 lb

## 2015-01-02 DIAGNOSIS — I252 Old myocardial infarction: Secondary | ICD-10-CM | POA: Insufficient documentation

## 2015-01-02 DIAGNOSIS — E1122 Type 2 diabetes mellitus with diabetic chronic kidney disease: Secondary | ICD-10-CM | POA: Diagnosis not present

## 2015-01-02 DIAGNOSIS — I5022 Chronic systolic (congestive) heart failure: Secondary | ICD-10-CM

## 2015-01-02 DIAGNOSIS — Z833 Family history of diabetes mellitus: Secondary | ICD-10-CM | POA: Insufficient documentation

## 2015-01-02 DIAGNOSIS — I2510 Atherosclerotic heart disease of native coronary artery without angina pectoris: Secondary | ICD-10-CM | POA: Diagnosis not present

## 2015-01-02 DIAGNOSIS — I482 Chronic atrial fibrillation, unspecified: Secondary | ICD-10-CM

## 2015-01-02 DIAGNOSIS — I129 Hypertensive chronic kidney disease with stage 1 through stage 4 chronic kidney disease, or unspecified chronic kidney disease: Secondary | ICD-10-CM | POA: Diagnosis not present

## 2015-01-02 DIAGNOSIS — N183 Chronic kidney disease, stage 3 unspecified: Secondary | ICD-10-CM

## 2015-01-02 DIAGNOSIS — K5900 Constipation, unspecified: Secondary | ICD-10-CM | POA: Diagnosis not present

## 2015-01-02 DIAGNOSIS — Z79899 Other long term (current) drug therapy: Secondary | ICD-10-CM | POA: Insufficient documentation

## 2015-01-02 DIAGNOSIS — Z794 Long term (current) use of insulin: Secondary | ICD-10-CM | POA: Insufficient documentation

## 2015-01-02 DIAGNOSIS — Z8249 Family history of ischemic heart disease and other diseases of the circulatory system: Secondary | ICD-10-CM | POA: Diagnosis not present

## 2015-01-02 DIAGNOSIS — I428 Other cardiomyopathies: Secondary | ICD-10-CM | POA: Diagnosis not present

## 2015-01-02 DIAGNOSIS — E559 Vitamin D deficiency, unspecified: Secondary | ICD-10-CM | POA: Insufficient documentation

## 2015-01-02 DIAGNOSIS — K219 Gastro-esophageal reflux disease without esophagitis: Secondary | ICD-10-CM | POA: Diagnosis not present

## 2015-01-02 DIAGNOSIS — M8580 Other specified disorders of bone density and structure, unspecified site: Secondary | ICD-10-CM | POA: Insufficient documentation

## 2015-01-02 DIAGNOSIS — Z7901 Long term (current) use of anticoagulants: Secondary | ICD-10-CM | POA: Diagnosis not present

## 2015-01-02 DIAGNOSIS — I447 Left bundle-branch block, unspecified: Secondary | ICD-10-CM | POA: Diagnosis not present

## 2015-01-02 DIAGNOSIS — F329 Major depressive disorder, single episode, unspecified: Secondary | ICD-10-CM | POA: Diagnosis not present

## 2015-01-02 LAB — BASIC METABOLIC PANEL
ANION GAP: 9 (ref 5–15)
BUN: 23 mg/dL — ABNORMAL HIGH (ref 6–20)
CO2: 30 mmol/L (ref 22–32)
Calcium: 8.9 mg/dL (ref 8.9–10.3)
Chloride: 99 mmol/L — ABNORMAL LOW (ref 101–111)
Creatinine, Ser: 1.48 mg/dL — ABNORMAL HIGH (ref 0.44–1.00)
GFR calc Af Amer: 40 mL/min — ABNORMAL LOW (ref >60)
GFR calc non Af Amer: 34 mL/min — ABNORMAL LOW (ref >60)
GLUCOSE: 81 mg/dL (ref 65–99)
POTASSIUM: 3.5 mmol/L (ref 3.5–5.1)
Sodium: 138 mmol/L (ref 135–145)

## 2015-01-02 NOTE — Patient Instructions (Signed)
FOLLOW UP:2 months n/p clinic

## 2015-01-02 NOTE — Progress Notes (Signed)
Patient ID: Victoria Adams, female   DOB: March 07, 1941, 74 y.o.   MRN: [id number] PCP: Dr. Gar Ponto ([facility] in [place]) Primary Cardiologist: Dr. Domenic Polite EP: Dr Caryl Comes   HPI: Victoria Adams is a 74 yo female with a history of chronic anemia, HTN, chronic A-Fib, nonischemic cardiomyopathy, DM2, CAD, chronic back pain and chronic systolic HF.   Admitted to the hospital 5/13-5/27/15 for A/C HF and NSTEMI. Troponin peaked at 6.7 and was taken for cath 08/24/13 showing moderate diffuse mid LAD and septal perforator branck with ostial ~90% occluded. Treated medically. History of chronic anemia and Hgb continued to drop and GI consulted and they did EGD showing no evidence of bleeding or pathology. Cr started rising and PICC was placed for CVP monitoring and co-oxs. She was placed on milrinone and once weaned off co-ox remained marginal in the mid 81s. Discharge weight 236 lbs.   She returns for HF follow up.  Last visit she was orthostatic due to extra metolazone she was taking at home. She given IV fluids. Renal function was elevated and she was instructed to hold lasix for 3 days. Today she is feeling much better. Denies SOB/PND/Orthopne.  Weight at home 247-252 pounds. Taking all medications. No lower extremity edema.    ECHO (08/2013) EF 20-25%, RV normal            (02/2014) EF 25-30%, RV normal           (09/2014) EF 20-25% RV mildly dilated.    Labs: (09/11/13): Creatinine 1.3, K 4.2, BUN 29, dig level 0.4 (09/20/13) Creatinine 1.36 K 4.9  (10/03/13) K 5.8, creatinine 1.67, BUN 51 (10/11/13) K 5.0, creatinine 1.21, BUN 26 (11/26/13) K 3.7 Creatinine 1.0  01/10/14 K 4.2 Cr 1.07  (02/27/14) K 4.0, creatinine 1.01 (12/15) K 4.1, creatinine 1.14 (04/16/2014) K 4.0 Creatinine 1.29 Hgb 13.8  (09/13/2014): K 4.0 Creatinine 1.53  (12/26/2014) K 3.1 Creatinuine 2.46   SH: Lives in [place] with son and daughter, no ETOH and does not smoke, retired  FH: Mother deceased: CAD?, HTN, lung cancer, afib        Father  deceased: DM2, lung cancer, HTN, CAD stents        3 daughters (1 has afib)        1 son: healthy and alive  ROS: All systems negative except as listed in HPI, PMH and Problem List.  Past Medical History  Diagnosis Date  . Obesity   . Chronic low back pain     Secondary to DJD  . Vitamin D deficiency   . Fatty liver disease, nonalcoholic   . Type 2 diabetes mellitus   . Allergic rhinitis   . Essential hypertension, benign   . Chronic systolic heart failure     a. echo (5/15):  mod LVH, EF 20-25%, diff HK, severe LAE, mild RAE  . GERD (gastroesophageal reflux disease)   . Coronary atherosclerosis-non obstructive     LHC (5/15):  EF 40-45% global HK; long LAD 40-60%, ostial 1st major septal perf 80-90%, ostial CFX ?%, mid AVCFX extensive Ca2+, dist PDA 50%  . Osteopenia   . Depression   . Rosacea   . Mitral regurgitation   . NICM (nonischemic cardiomyopathy)     a. echo (5/15):  mod LVH, EF 20-25%, diff HK, severe LAE, mild RAE  . Atrial fibrillation   . 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 Iron deficiency anemia   . NSTEMI (non-ST elevated myocardial  infarction) may 2015    No CAD  . PONV (postoperative nausea and vomiting)     Current Outpatient Prescriptions  Medication Sig Dispense Refill  . Calcium Carb-Cholecalciferol (CALCIUM 600 + D PO) Take 1 tablet by mouth 2 (two) times daily.     . carvedilol (COREG) 12.5 MG tablet Take 1.5 tablets (18.75 mg total) by mouth 2 (two) times daily with a meal. 270 tablet 6  . dimenhyDRINATE (DRAMAMINE) 50 MG tablet Take 50 mg by mouth every 8 (eight) hours as needed for nausea.    . diphenhydrAMINE (SOMINEX) 25 MG tablet Take 25 mg by mouth at bedtime as needed for itching or sleep.    Marland Kitchen doxycycline (VIBRAMYCIN) 100 MG capsule Take 100-200 mg by mouth 2 (two) times daily as needed (rosacea).     Marland Kitchen ELIQUIS 5 MG TABS tablet TAKE (1) TABLET TWICE DAILY. 60 tablet 3  . ferrous sulfate 325 (65 FE) MG tablet TAKE 1 TABLET ONCE DAILY WITH  BREAKFAST. 30 tablet 0  . furosemide (LASIX) 80 MG tablet Take 80 mg by mouth 2 (two) times daily.    . insulin glargine (LANTUS) 100 UNIT/ML injection Inject 65 Units into the skin 2 (two) times daily. 70 units in the morning and 60 units in the evening    . metroNIDAZOLE (METROGEL) 0.75 % gel Apply 1 application topically 2 (two) times daily as needed (rosacea).    . mometasone (ELOCON) 0.1 % lotion Apply 1 application topically daily as needed (eczema). EAR ECZEMA    . omeprazole (PRILOSEC) 20 MG capsule Take 20 mg by mouth 2 (two) times daily.    Marland Kitchen oxyCODONE-acetaminophen (PERCOCET) 10-325 MG per tablet Take 1 tablet by mouth every 6 (six) hours as needed for pain.     . polyethylene glycol (MIRALAX / GLYCOLAX) packet Take 17 g by mouth daily.    . rosuvastatin (CRESTOR) 20 MG tablet TAKE 1 TABLET AT 6:00 P.M. 30 tablet 0  . sacubitril-valsartan (ENTRESTO) 24-26 MG Take 1 tablet by mouth 2 (two) times daily. 60 tablet 3  . spironolactone (ALDACTONE) 25 MG tablet TAKE (1/2) TABLET BY MOUTH DAILY. 15 tablet 6  . venlafaxine (EFFEXOR) 75 MG tablet Take 75 mg by mouth 2 (two) times daily.     . metolazone (ZAROXOLYN) 5 MG tablet Take 1 tablet by mouth as needed for weight gain. (Patient not taking: Reported on 01/02/2015) 6 tablet 3  . potassium chloride SA (K-DUR,KLOR-CON) 20 MEQ tablet Take 40 MEQ (2 tablets) by mouth with Metolazone. (Patient not taking: Reported on 01/02/2015) 12 tablet 3   No current facility-administered medications for this encounter.     Filed Vitals:   01/02/15 1239  BP: 118/72  Pulse: 83  Weight: 255 lb 2 oz (115.724 kg)  SpO2: 96%    PHYSICAL EXAM: General:  Elderly appearing. No resp difficulty, walked in with rolling  walker, daughter present.  HEENT: normal Neck: supple. JVP very hard to see. Does not appear elevated.  Carotids 2+ bilaterally; no bruits. No lymphadenopathy or thryomegaly appreciated. Cor: PMI normal.  irregular rhythm, no rubs, gallops or  murmurs. Lungs: clear Abdomen: markedly obese, soft, nontender, minimally distended.  No bruits or masses. Good bowel sounds. Extremities: no cyanosis, clubbing, rash,  edema. Neuro: alert & orientedx3, cranial nerves grossly intact. Moves all 4 extremities w/o difficulty. Affect pleasant.   ASSESSMENT & PLAN:  1) Chronic systolic HF: NICM, EF 40-34%, RV nl (02/2014).  Repeat ECHO 09/2014 EF 20-25%.  NYHA II-IIIb  symptom. Volume status stable. Continue lasix 80 mg twice a day.  Check BMET now.   Now baseline weight at home will be 250-255 pounds at home. .  - Continue 18.75 coreg twice daily.  - Continue current Entresto 24-26 mg twice daily.  -Continue spironolactone 12.5 mg daily.   -- Reinforced the need and importance of daily weights, a low sodium diet, and fluid restriction (less than 2 L a day). Instructed to call the HF clinic if weight increases more than 3 lbs overnight or 5 lbs in a week.  2) CAD: Moderate diffuse mid LAD disease of 40-60%. Septal perforator branch with ostial ~90%.  - No chest pain. Continue statin and BB. 3) CKD stage III: Most recent creatinine elevated because she was taking extra metolazone. Repeat BMET now.  4) Afib: Chronic with rate control. Will continue Eliquis 5 mg BID and Coreg.  5) Falls- Using rolling walker  .  6) Constipation- Has been evaluated by PCI   7) LBBB  Follow up 2 months. Refer to EP for Bi V  CLEGG,AMY NP-C 01/02/2015

## 2015-01-27 ENCOUNTER — Other Ambulatory Visit: Payer: Self-pay | Admitting: Cardiology

## 2015-02-01 ENCOUNTER — Other Ambulatory Visit: Payer: Self-pay | Admitting: Physician Assistant

## 2015-03-07 ENCOUNTER — Other Ambulatory Visit (HOSPITAL_COMMUNITY): Payer: Self-pay | Admitting: Internal Medicine

## 2015-03-19 NOTE — Telephone Encounter (Signed)
This encounter was created in error - please disregard.

## 2015-03-31 ENCOUNTER — Other Ambulatory Visit: Payer: Self-pay | Admitting: Cardiology

## 2015-05-09 ENCOUNTER — Other Ambulatory Visit: Payer: Self-pay | Admitting: Cardiology

## 2015-05-12 IMAGING — CR DG HIP W/ PELVIS BILAT
5 series · 5 of 5 positions shown · non-contrast
Comparison: None.

CLINICAL DATA: Fall 2 weeks ago with persistent pain.

EXAM:
BILATERAL HIP WITH PELVIS - 4+ VIEW

[t pelvis ap]
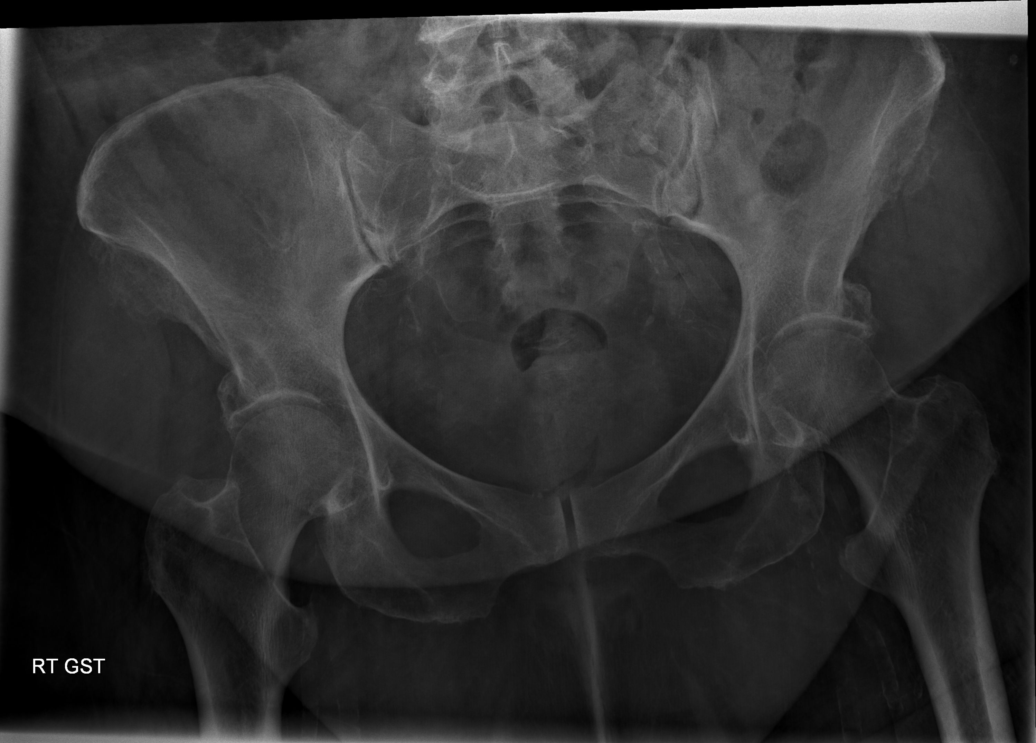

[t hip ap left]
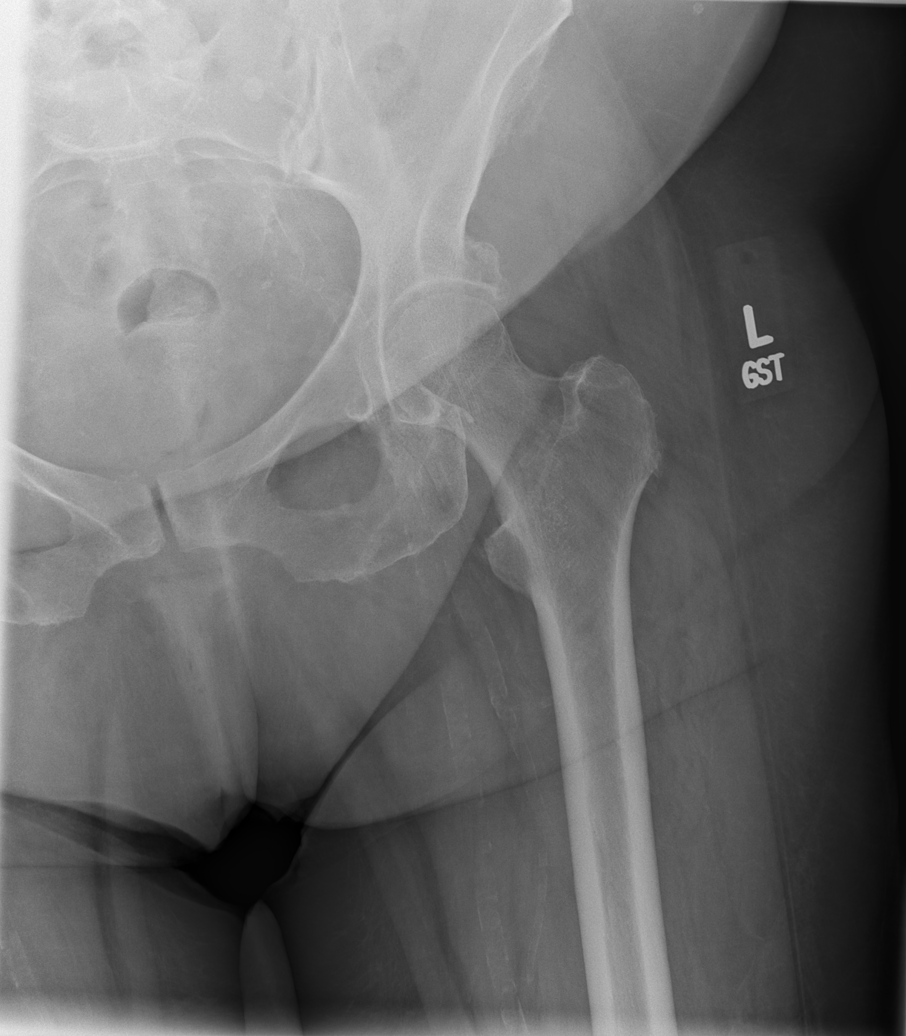

[t hip ap right]
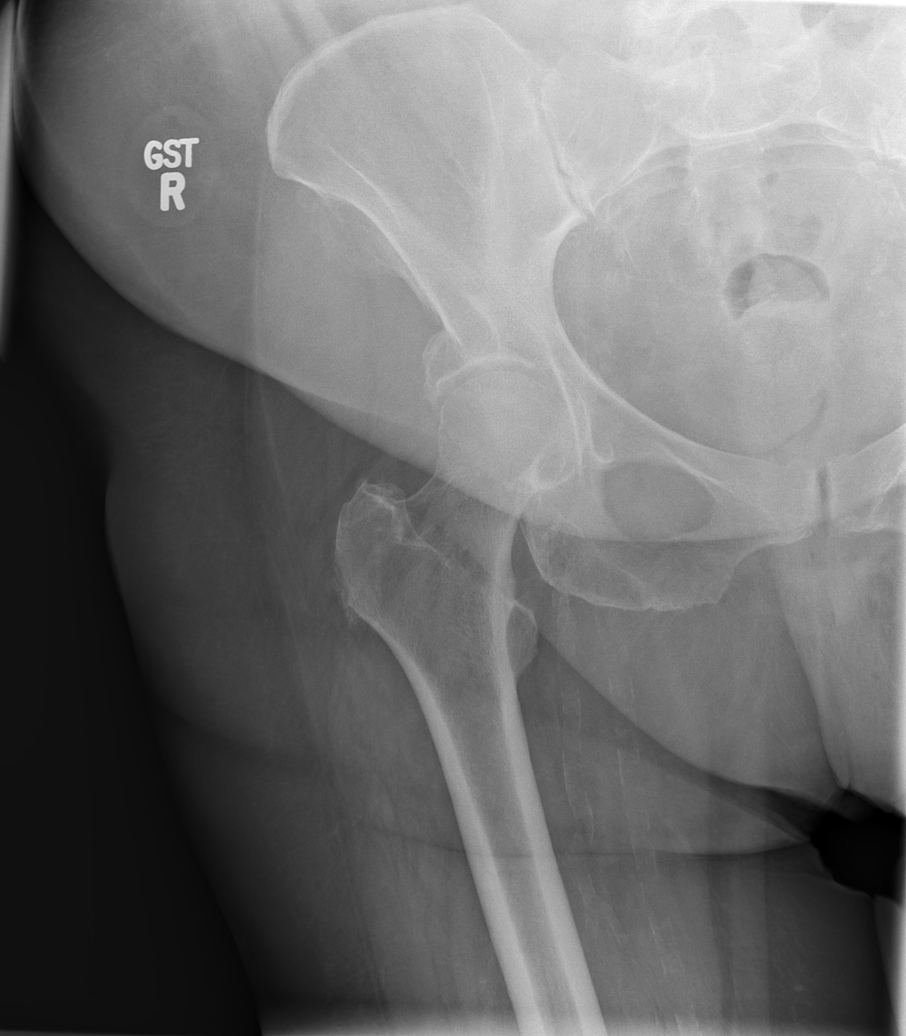

[t hip frog leg right]
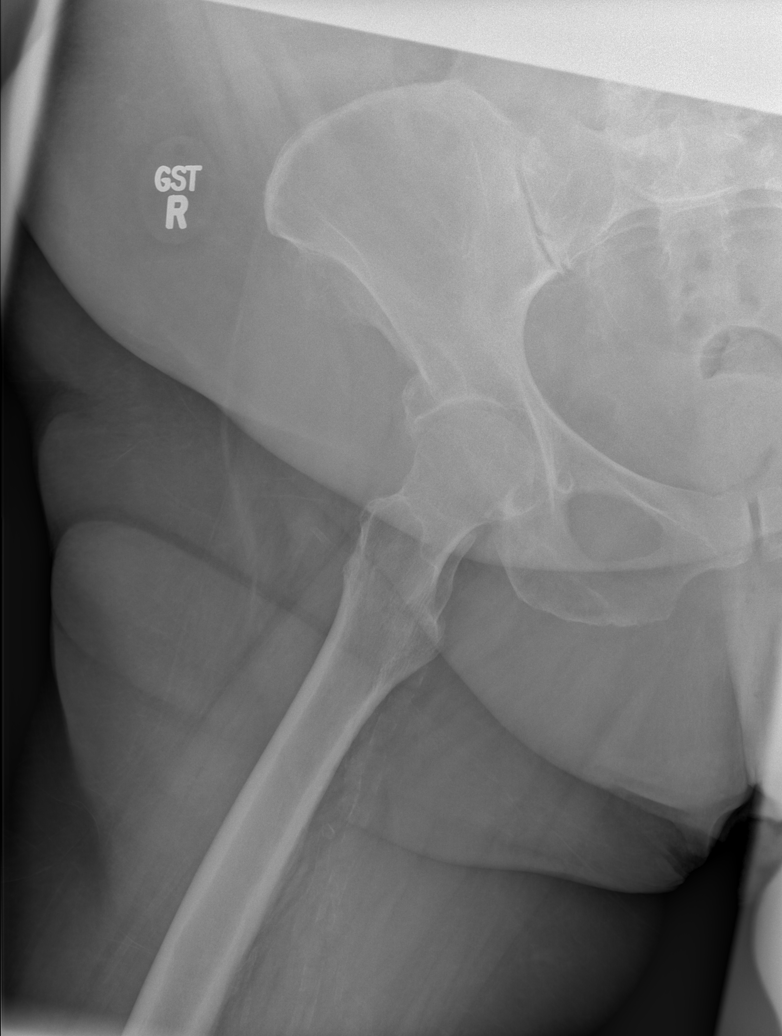

[t hip frog leg left]
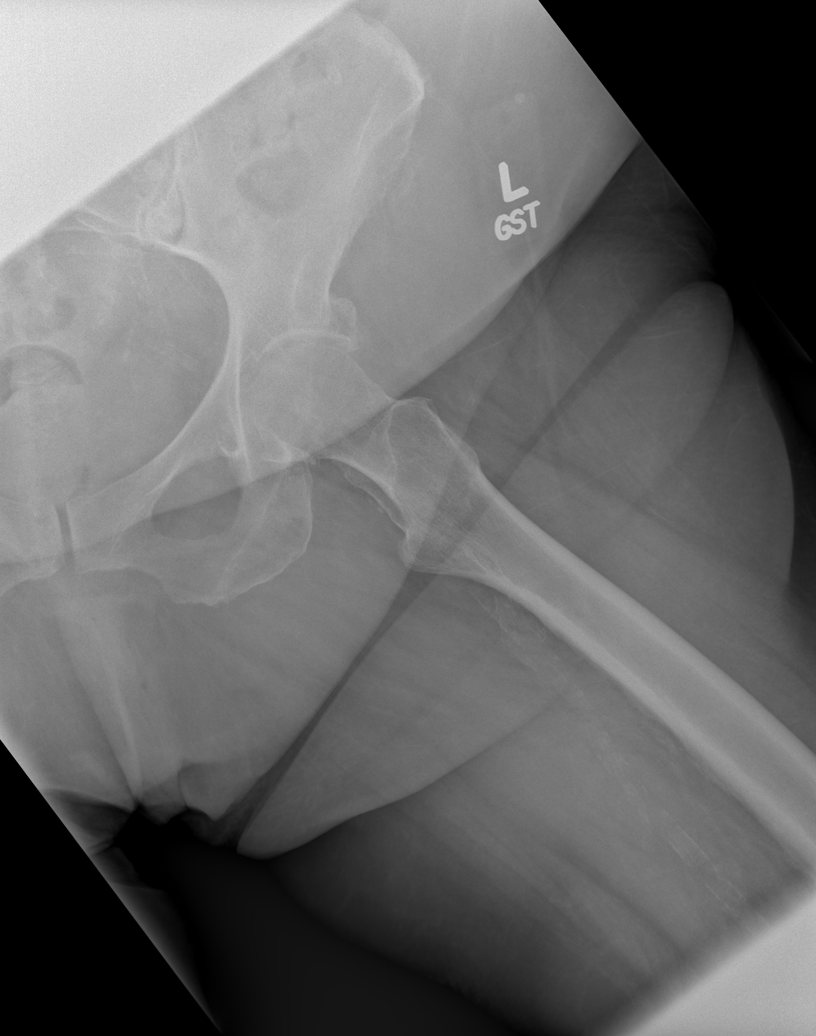

[5 of 5 positions shown; findings below may reference images not displayed]

FINDINGS: Femoral heads are well formed and located. Hip joint spaces are
intact. Sacroiliac joints are symmetric.

No destructive bony lesions. Included soft tissue planes are
non-suspicious. Moderate vascular calcifications.
IMPRESSION: No acute fracture deformity or dislocation.

  By: Geetha Assefa

## 2015-05-12 IMAGING — CR DG SACRUM/COCCYX 2+V
3 series · 3 of 3 positions shown · non-contrast
Comparison: None.

CLINICAL DATA: Fall 2 weeks ago, pain.

EXAM:
SACRUM AND COCCYX - 2+ VIEW

[t sacrum ap]
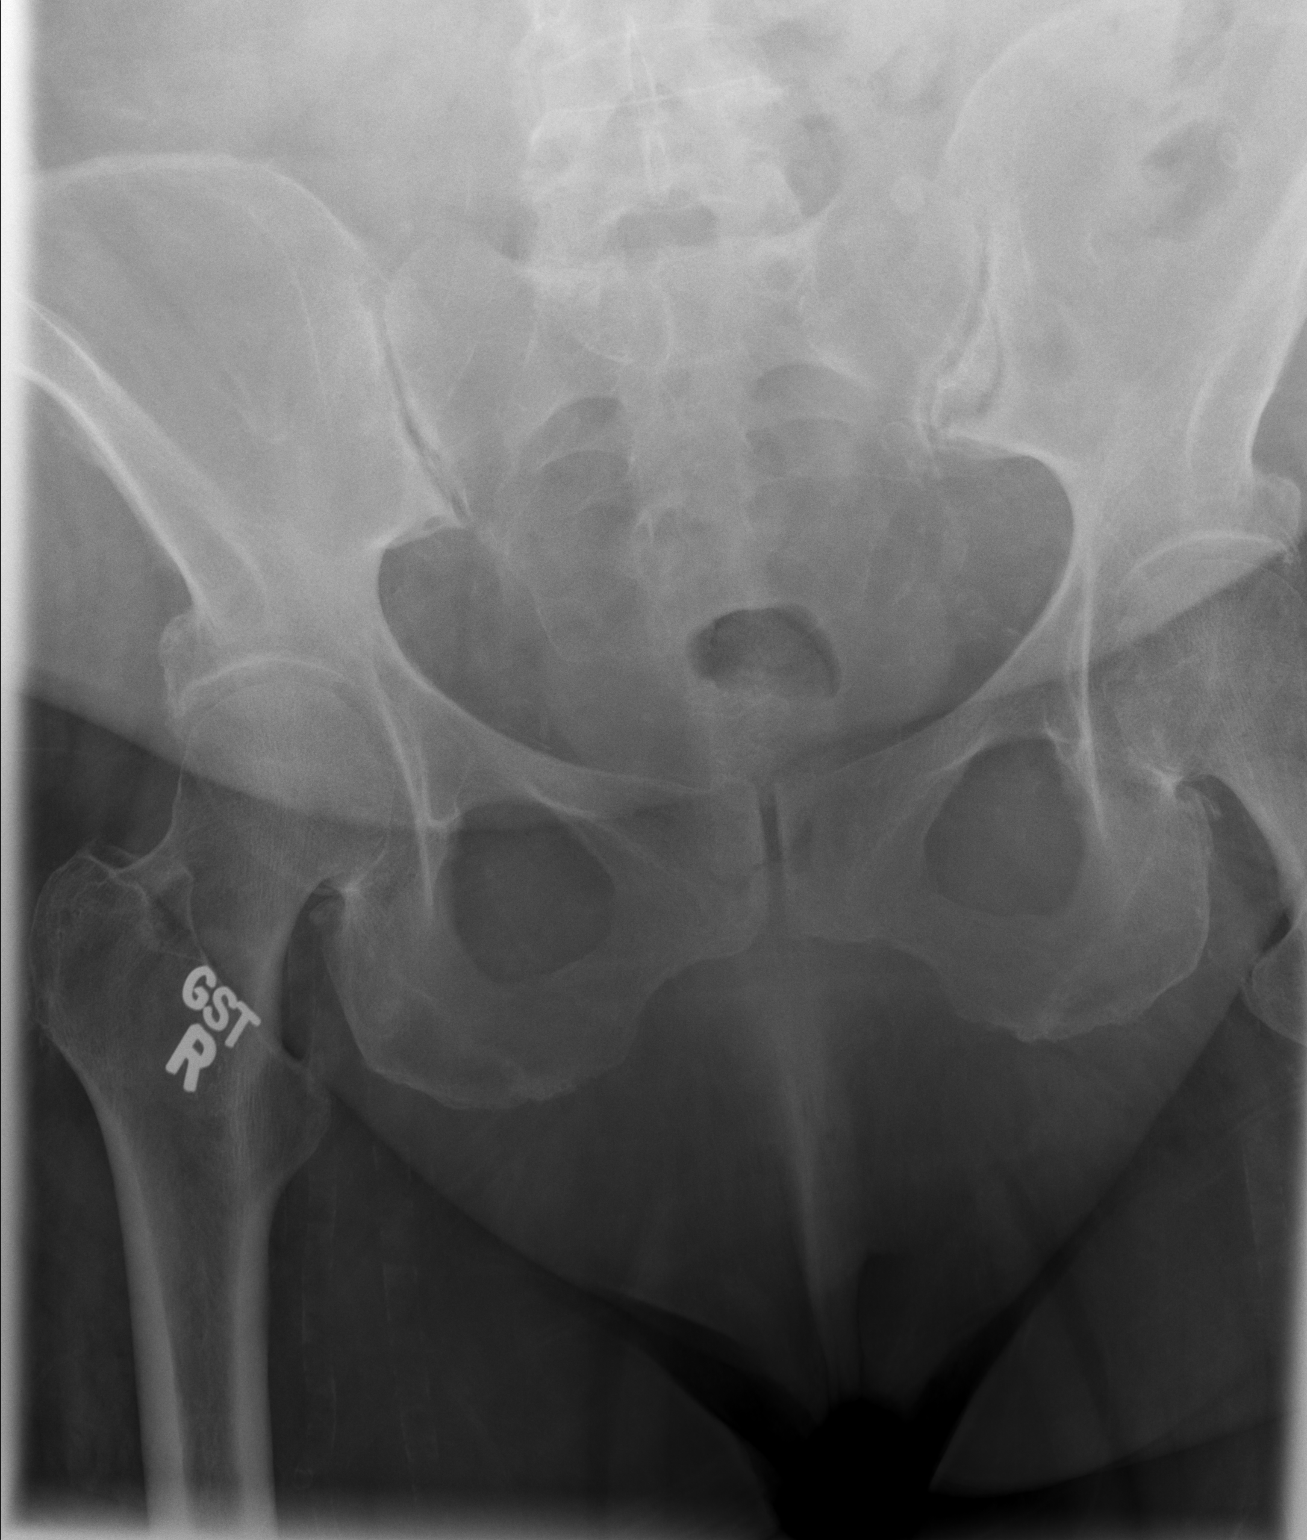

[t coccyx ap]
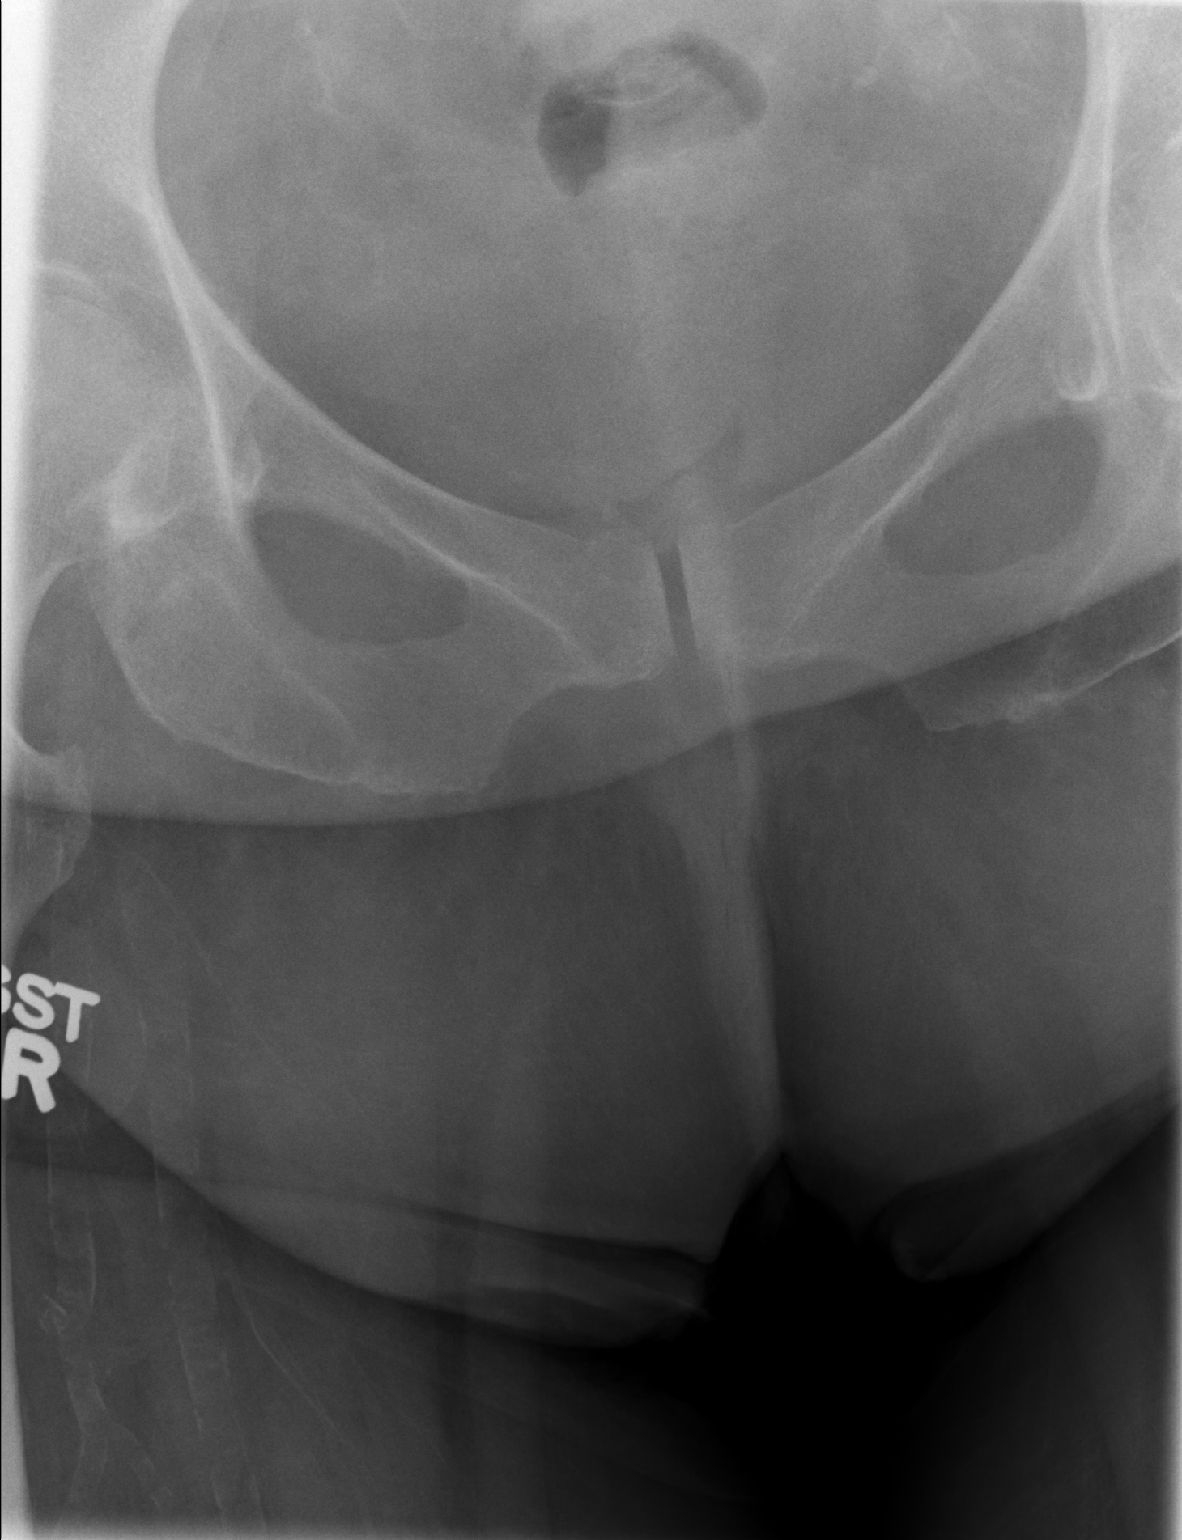

[t sacrum coccyx lat]
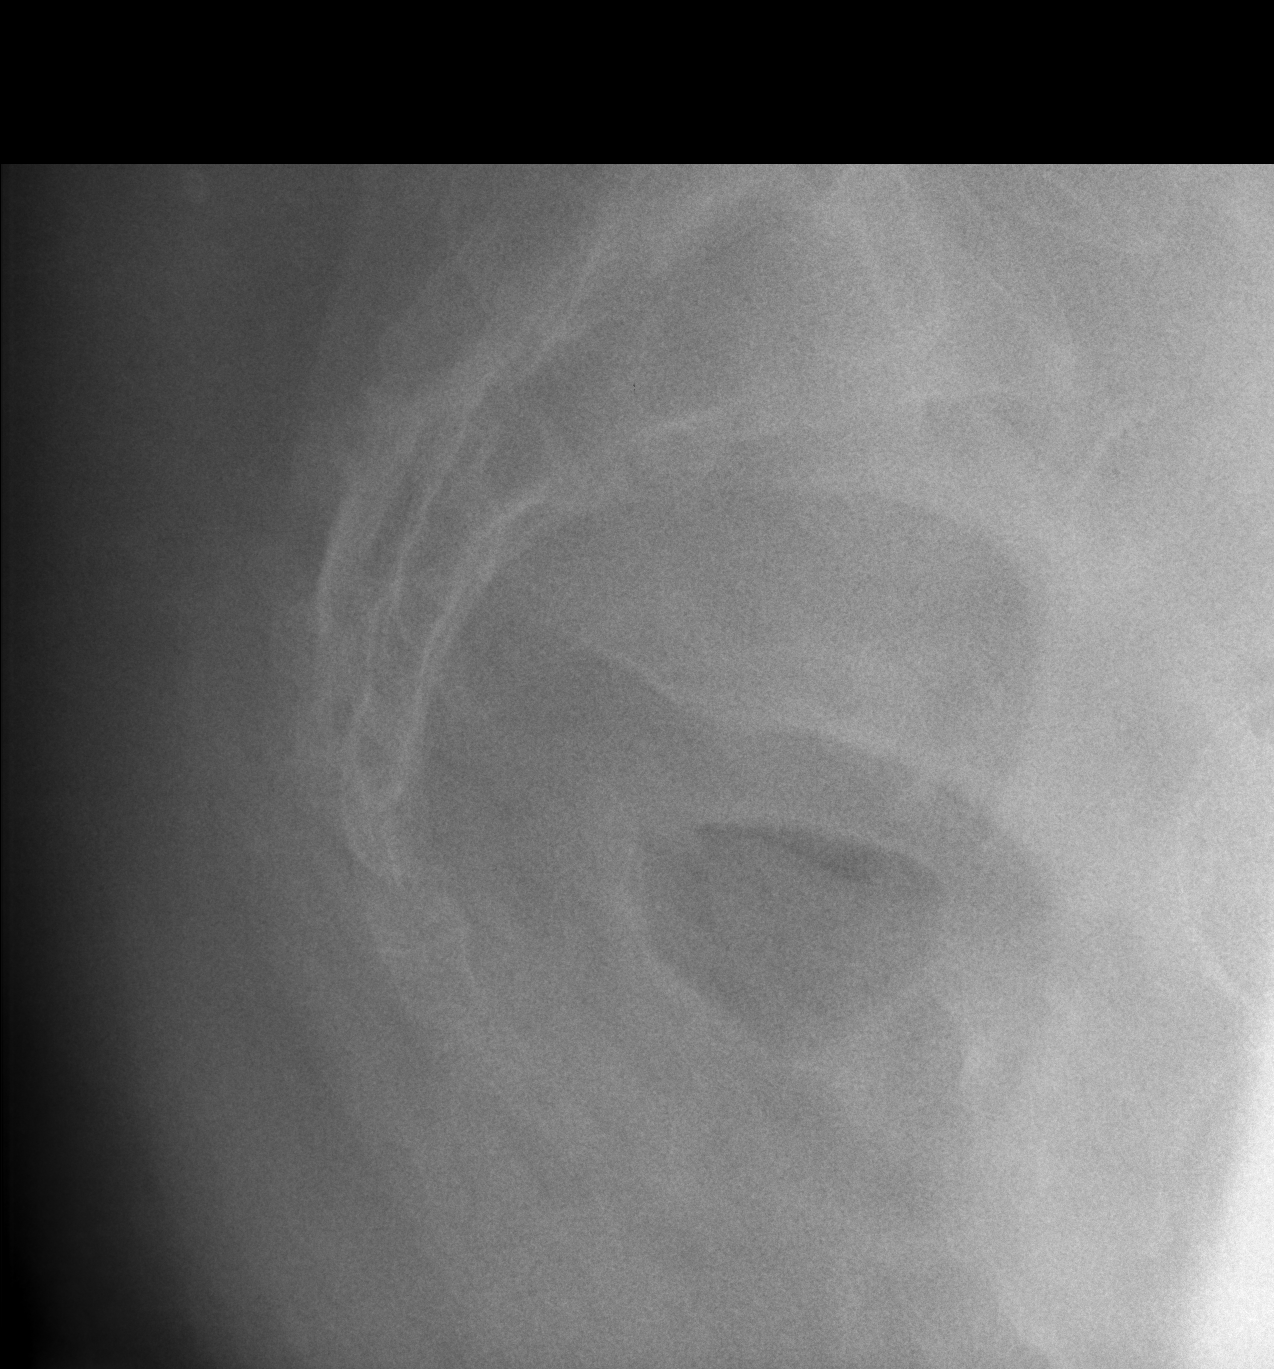

[3 of 3 positions shown; findings below may reference images not displayed]

FINDINGS: There is no evidence of fracture or other focal bone lesions.
Moderate vascular calcifications.
IMPRESSION: Negative.

  By: Chai Tiger

## 2015-05-16 IMAGING — CR DG CHEST 1V PORT
1 series · 1 of 1 positions shown · non-contrast
Comparison: DG CHEST 1V PORT dated 08/21/2013

CLINICAL DATA: sob

EXAM:
PORTABLE CHEST - 1 VIEW

[portable]
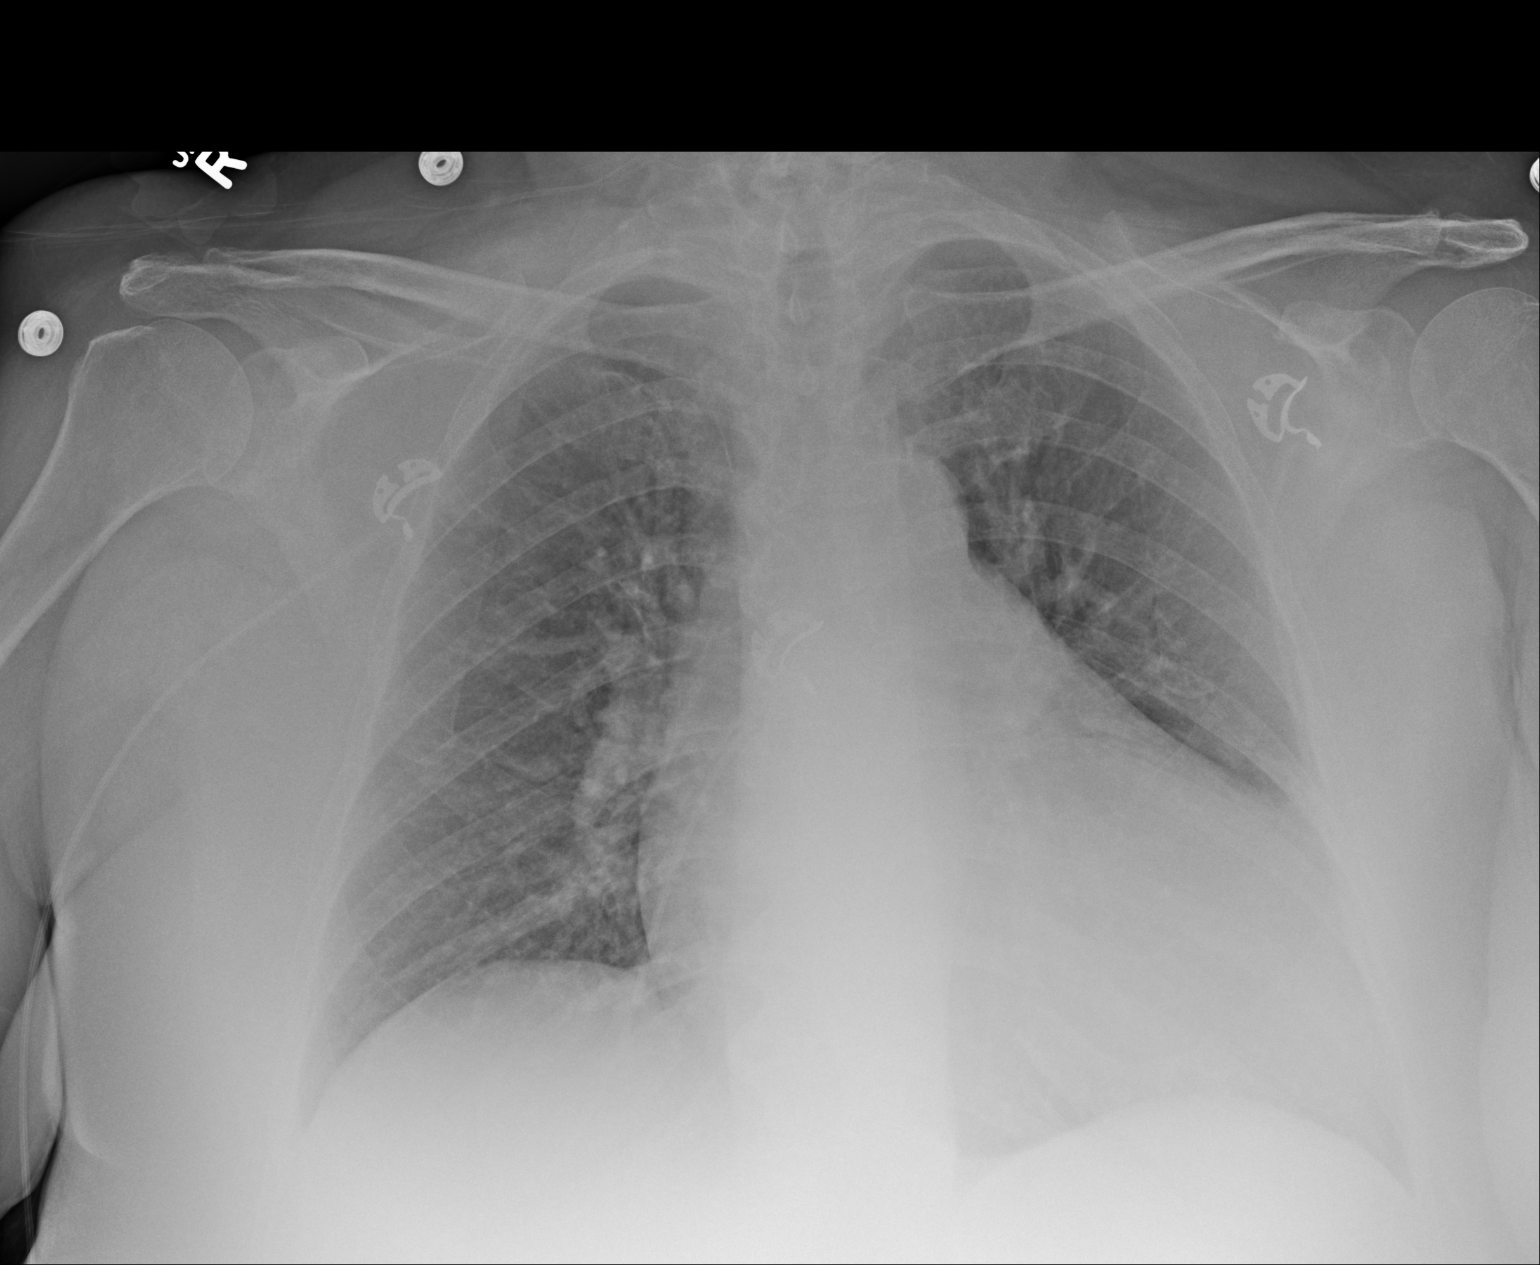

[1 of 1 positions shown; findings below may reference images not displayed]

FINDINGS: Stable cardiomegaly. The lungs are clear. No acute osseous
abnormalities.
IMPRESSION: Stable cardiomegaly without acute cardiopulmonary disease.

## 2015-06-02 DIAGNOSIS — Z23 Encounter for immunization: Secondary | ICD-10-CM | POA: Diagnosis not present

## 2015-06-02 DIAGNOSIS — F331 Major depressive disorder, recurrent, moderate: Secondary | ICD-10-CM | POA: Diagnosis not present

## 2015-06-02 DIAGNOSIS — G6289 Other specified polyneuropathies: Secondary | ICD-10-CM | POA: Diagnosis not present

## 2015-06-02 DIAGNOSIS — I482 Chronic atrial fibrillation: Secondary | ICD-10-CM | POA: Diagnosis not present

## 2015-06-02 DIAGNOSIS — E6609 Other obesity due to excess calories: Secondary | ICD-10-CM | POA: Diagnosis not present

## 2015-06-02 DIAGNOSIS — E782 Mixed hyperlipidemia: Secondary | ICD-10-CM | POA: Diagnosis not present

## 2015-06-02 DIAGNOSIS — I2510 Atherosclerotic heart disease of native coronary artery without angina pectoris: Secondary | ICD-10-CM | POA: Diagnosis not present

## 2015-06-02 DIAGNOSIS — K219 Gastro-esophageal reflux disease without esophagitis: Secondary | ICD-10-CM | POA: Diagnosis not present

## 2015-06-02 DIAGNOSIS — E1122 Type 2 diabetes mellitus with diabetic chronic kidney disease: Secondary | ICD-10-CM | POA: Diagnosis not present

## 2015-06-02 DIAGNOSIS — J301 Allergic rhinitis due to pollen: Secondary | ICD-10-CM | POA: Diagnosis not present

## 2015-06-02 DIAGNOSIS — I10 Essential (primary) hypertension: Secondary | ICD-10-CM | POA: Diagnosis not present

## 2015-06-02 DIAGNOSIS — I5032 Chronic diastolic (congestive) heart failure: Secondary | ICD-10-CM | POA: Diagnosis not present

## 2015-06-16 ENCOUNTER — Other Ambulatory Visit (HOSPITAL_COMMUNITY): Payer: Self-pay | Admitting: Cardiology

## 2015-06-20 ENCOUNTER — Telehealth (HOSPITAL_COMMUNITY): Payer: Self-pay | Admitting: Pharmacist

## 2015-06-20 NOTE — Telephone Encounter (Signed)
Entresto 24-26 mg PA approved by [employer] through 04/11/16.   [name]. Velva Harman, PharmD, BCPS, CPP Clinical Pharmacist Pager: [phone] Phone: [phone] 06/20/2015 12:52 PM

## 2015-07-10 ENCOUNTER — Other Ambulatory Visit (HOSPITAL_COMMUNITY): Payer: Self-pay | Admitting: Internal Medicine

## 2015-08-06 ENCOUNTER — Other Ambulatory Visit (HOSPITAL_COMMUNITY): Payer: Self-pay | Admitting: Cardiology

## 2015-09-10 ENCOUNTER — Other Ambulatory Visit (HOSPITAL_COMMUNITY): Payer: Self-pay | Admitting: Cardiology

## 2015-09-16 ENCOUNTER — Other Ambulatory Visit (HOSPITAL_COMMUNITY): Payer: Self-pay | Admitting: Cardiology

## 2015-10-13 ENCOUNTER — Other Ambulatory Visit (HOSPITAL_COMMUNITY): Payer: Self-pay | Admitting: Cardiology

## 2015-11-12 ENCOUNTER — Other Ambulatory Visit: Payer: Self-pay | Admitting: Cardiology

## 2015-11-12 NOTE — Telephone Encounter (Signed)
carvedilol (COREG) 12.5 MG tablet  Medication  Date: 10/15/2015 Department: [facility] Ordering/Authorizing: Larey Dresser, MD  Order Providers   Prescribing Provider Encounter Provider  Larey Dresser, MD Larey Dresser, MD  Medication Detail    Disp Refills Start End   carvedilol (COREG) 12.5 MG tablet 90 tablet 3 10/15/2015    Sig: TAKE 1 & 1/2 TABLET 2 TIMES DAILY WITH A MEAL.   E-Prescribing Status: Receipt confirmed by pharmacy (10/15/2015 10:15 AM EDT)   Pharmacy   [place], [place]

## 2015-12-20 ENCOUNTER — Other Ambulatory Visit (HOSPITAL_COMMUNITY): Payer: Self-pay | Admitting: Cardiology

## 2016-01-01 ENCOUNTER — Encounter (HOSPITAL_COMMUNITY): Payer: Medicare Other

## 2016-01-12 ENCOUNTER — Other Ambulatory Visit (HOSPITAL_COMMUNITY): Payer: Self-pay | Admitting: Cardiology

## 2016-02-04 ENCOUNTER — Ambulatory Visit (HOSPITAL_COMMUNITY)
Admission: RE | Admit: 2016-02-04 | Discharge: 2016-02-04 | Disposition: A | Discharge status: Home / Self Care | Payer: Medicare Other | Source: Ambulatory Visit | Attending: Cardiology | Admitting: Cardiology

## 2016-02-04 ENCOUNTER — Encounter (HOSPITAL_COMMUNITY): Payer: Self-pay

## 2016-02-04 VITALS — BP 108/68 | HR 87 | Wt 283.0 lb

## 2016-02-04 DIAGNOSIS — L719 Rosacea, unspecified: Secondary | ICD-10-CM | POA: Diagnosis not present

## 2016-02-04 DIAGNOSIS — N183 Chronic kidney disease, stage 3 (moderate): Secondary | ICD-10-CM | POA: Diagnosis not present

## 2016-02-04 DIAGNOSIS — Z959 Presence of cardiac and vascular implant and graft, unspecified: Secondary | ICD-10-CM | POA: Diagnosis not present

## 2016-02-04 DIAGNOSIS — K219 Gastro-esophageal reflux disease without esophagitis: Secondary | ICD-10-CM | POA: Diagnosis not present

## 2016-02-04 DIAGNOSIS — I2510 Atherosclerotic heart disease of native coronary artery without angina pectoris: Secondary | ICD-10-CM | POA: Insufficient documentation

## 2016-02-04 DIAGNOSIS — F329 Major depressive disorder, single episode, unspecified: Secondary | ICD-10-CM | POA: Insufficient documentation

## 2016-02-04 DIAGNOSIS — Z794 Long term (current) use of insulin: Secondary | ICD-10-CM | POA: Insufficient documentation

## 2016-02-04 DIAGNOSIS — E559 Vitamin D deficiency, unspecified: Secondary | ICD-10-CM | POA: Diagnosis not present

## 2016-02-04 DIAGNOSIS — E669 Obesity, unspecified: Secondary | ICD-10-CM | POA: Insufficient documentation

## 2016-02-04 DIAGNOSIS — M8580 Other specified disorders of bone density and structure, unspecified site: Secondary | ICD-10-CM | POA: Insufficient documentation

## 2016-02-04 DIAGNOSIS — I482 Chronic atrial fibrillation, unspecified: Secondary | ICD-10-CM

## 2016-02-04 DIAGNOSIS — K5900 Constipation, unspecified: Secondary | ICD-10-CM | POA: Diagnosis not present

## 2016-02-04 DIAGNOSIS — I252 Old myocardial infarction: Secondary | ICD-10-CM | POA: Insufficient documentation

## 2016-02-04 DIAGNOSIS — E1122 Type 2 diabetes mellitus with diabetic chronic kidney disease: Secondary | ICD-10-CM | POA: Insufficient documentation

## 2016-02-04 DIAGNOSIS — I429 Cardiomyopathy, unspecified: Secondary | ICD-10-CM | POA: Insufficient documentation

## 2016-02-04 DIAGNOSIS — I130 Hypertensive heart and chronic kidney disease with heart failure and stage 1 through stage 4 chronic kidney disease, or unspecified chronic kidney disease: Secondary | ICD-10-CM | POA: Insufficient documentation

## 2016-02-04 DIAGNOSIS — Z7901 Long term (current) use of anticoagulants: Secondary | ICD-10-CM | POA: Diagnosis not present

## 2016-02-04 DIAGNOSIS — I447 Left bundle-branch block, unspecified: Secondary | ICD-10-CM | POA: Diagnosis not present

## 2016-02-04 DIAGNOSIS — D509 Iron deficiency anemia, unspecified: Secondary | ICD-10-CM | POA: Insufficient documentation

## 2016-02-04 DIAGNOSIS — G8929 Other chronic pain: Secondary | ICD-10-CM | POA: Diagnosis not present

## 2016-02-04 DIAGNOSIS — M1990 Unspecified osteoarthritis, unspecified site: Secondary | ICD-10-CM | POA: Insufficient documentation

## 2016-02-04 DIAGNOSIS — I5022 Chronic systolic (congestive) heart failure: Secondary | ICD-10-CM | POA: Diagnosis not present

## 2016-02-04 LAB — BASIC METABOLIC PANEL
Anion gap: 8 (ref 5–15)
BUN: 26 mg/dL — AB (ref 6–20)
CHLORIDE: 102 mmol/L (ref 101–111)
CO2: 27 mmol/L (ref 22–32)
CREATININE: 1.63 mg/dL — AB (ref 0.44–1.00)
Calcium: 8.3 mg/dL — ABNORMAL LOW (ref 8.9–10.3)
GFR calc Af Amer: 35 mL/min — ABNORMAL LOW (ref >60)
GFR calc non Af Amer: 30 mL/min — ABNORMAL LOW (ref >60)
GLUCOSE: 146 mg/dL — AB (ref 65–99)
Potassium: 3.8 mmol/L (ref 3.5–5.1)
Sodium: 137 mmol/L (ref 135–145)

## 2016-02-04 LAB — CBC
HEMATOCRIT: 41.7 % (ref 36.0–46.0)
Hemoglobin: 13.5 g/dL (ref 12.0–15.0)
MCH: 29.8 pg (ref 26.0–34.0)
MCHC: 32.4 g/dL (ref 30.0–36.0)
MCV: 92.1 fL (ref 78.0–100.0)
PLATELETS: 208 10*3/uL (ref 150–400)
RBC: 4.53 MIL/uL (ref 3.87–5.11)
RDW: 13.3 % (ref 11.5–15.5)
WBC: 9.1 10*3/uL (ref 4.0–10.5)

## 2016-02-04 LAB — TSH: TSH: 2.254 u[IU]/mL (ref 0.350–4.500)

## 2016-02-04 LAB — BRAIN NATRIURETIC PEPTIDE: B Natriuretic Peptide: 109.3 pg/mL — ABNORMAL HIGH (ref 0.0–100.0)

## 2016-02-04 MED ORDER — SPIRONOLACTONE 25 MG PO TABS: 12.5000 mg | ORAL_TABLET | Freq: Every day | ORAL | 3 refills | Status: DC

## 2016-02-04 MED ORDER — METOLAZONE 2.5 MG PO TABS: ORAL_TABLET | ORAL | 0 refills | Status: DC

## 2016-02-04 MED ORDER — POTASSIUM CHLORIDE CRYS ER 20 MEQ PO TBCR: 40.0000 meq | EXTENDED_RELEASE_TABLET | Freq: Every day | ORAL | 0 refills | Status: DC

## 2016-02-04 NOTE — Patient Instructions (Signed)
Take metolazone 2.5 mg tablet once daily for TWO DAYS.  Take Potassium 40 meq (2 tabs) once daily for TWO DAYS.  START Spironolactone 12.5 mg (1/2 tablet) once daily.  Routine lab work today. Will notify you of abnormal results, otherwise no news is good news!  Will schedule you for an echocardiogram at [facility]. Address: [address], [place], [place] [address] Phone: [phone]  Follow up 2 weeks with Amy Clegg NP-C.  Do the following things EVERYDAY: 1) Weigh yourself in the morning before breakfast. Write it down and keep it in a log. 2) Take your medicines as prescribed 3) Eat low salt foods-Limit salt (sodium) to 2000 mg per day.  4) Stay as active as you can everyday 5) Limit all fluids for the day to less than 2 liters

## 2016-02-04 NOTE — Progress Notes (Signed)
Patient ID: Victoria Adams, female   DOB: 03/16/1941, 75 y.o.   MRN: [id number] PCP: Dr. Gar Ponto ([facility] in [place]) Primary Cardiologist: Dr. Domenic Polite EP: Dr Caryl Comes   HPI: Victoria Adams is a 75 yo female with a history of chronic anemia, HTN, chronic A-Fib, nonischemic cardiomyopathy, DM2, CAD, chronic back pain and chronic systolic HF.   Admitted to the hospital 5/13-5/27/15 for A/C HF and NSTEMI. Troponin peaked at 6.7 and was taken for cath 08/24/13 showing moderate diffuse mid LAD and septal perforator branck with ostial ~90% occluded. Treated medically. History of chronic anemia and Hgb continued to drop and GI consulted and they did EGD showing no evidence of bleeding or pathology. Cr started rising and PICC was placed for CVP monitoring and co-oxs. She was placed on milrinone and once weaned off co-ox remained marginal in the mid 73s. Discharge weight 236 lbs.   She returns for HF follow up. Complains of fatigue and back pain. She has been followed by PCP for back pain.  Able to walk 5-6 minutes at home. Mild dyspnea with exertion. Denies Orthopnea/PND. Weight at home trending to 279. Not able to exercise. Taking all meds but not sure she when she stopped spiro. She does not drive. Lives with her    ECHO (08/2013) EF 20-25%, RV normal            (02/2014) EF 25-30%, RV normal           (09/2014) EF 20-25% RV mildly dilated.    Labs: (09/11/13): Creatinine 1.3, K 4.2, BUN 29, dig level 0.4 (09/20/13) Creatinine 1.36 K 4.9  (10/03/13) K 5.8, creatinine 1.67, BUN 51 (10/11/13) K 5.0, creatinine 1.21, BUN 26 (11/26/13) K 3.7 Creatinine 1.0  01/10/14 K 4.2 Cr 1.07  (02/27/14) K 4.0, creatinine 1.01 (12/15) K 4.1, creatinine 1.14 (04/16/2014) K 4.0 Creatinine 1.29 Hgb 13.8  (09/13/2014): K 4.0 Creatinine 1.53  (12/26/2014) K 3.1 Creatinuine 2.46   SH: Lives in [place] with son and daughter, no ETOH and does not smoke, retired  FH: Mother deceased: CAD?, HTN, lung cancer, afib        Father deceased:  DM2, lung cancer, HTN, CAD stents        3 daughters (1 has afib)        1 son: healthy and alive  ROS: All systems negative except as listed in HPI, PMH and Problem List.  Past Medical History:  Diagnosis Date  . Allergic rhinitis   . 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 Atrial fibrillation ([place])   . Chronic low back pain    Secondary to DJD  . Chronic systolic heart failure (HCC)    a. echo (5/15):  mod LVH, EF 20-25%, diff HK, severe LAE, mild RAE  . Coronary atherosclerosis-non obstructive    LHC (5/15):  EF 40-45% global HK; long LAD 40-60%, ostial 1st major septal perf 80-90%, ostial CFX ?%, mid AVCFX extensive Ca2+, dist PDA 50%  . Depression   . Essential hypertension, benign   . Fatty liver disease, nonalcoholic   . GERD (gastroesophageal reflux disease)   . Iron deficiency anemia   . Mitral regurgitation   . NICM (nonischemic cardiomyopathy) ([place])    a. echo (5/15):  mod LVH, EF 20-25%, diff HK, severe LAE, mild RAE  . NSTEMI (non-ST elevated myocardial infarction) ([place]) may 2015   No CAD  . Obesity   . Osteopenia   . PONV (postoperative nausea and vomiting)   .  Rosacea   . Type 2 diabetes mellitus ([place])   . Vitamin D deficiency     Current Outpatient Prescriptions  Medication Sig Dispense Refill  . Calcium Carb-Cholecalciferol (CALCIUM 600 + D PO) Take 1 tablet by mouth 2 (two) times daily.     . carvedilol (COREG) 12.5 MG tablet Take 1.5 tablets (18.75 mg total) by mouth 2 (two) times daily with a meal. 270 tablet 6  . dimenhyDRINATE (DRAMAMINE) 50 MG tablet Take 50 mg by mouth every 8 (eight) hours as needed for nausea.    . diphenhydrAMINE (SOMINEX) 25 MG tablet Take 25 mg by mouth at bedtime as needed for itching or sleep.    Marland Kitchen doxycycline (VIBRAMYCIN) 100 MG capsule Take 100-200 mg by mouth 2 (two) times daily as needed (rosacea).     Marland Kitchen ELIQUIS 5 MG TABS tablet TAKE (1) TABLET TWICE DAILY. 60 tablet 3  . ENTRESTO 24-26 MG TAKE (1) TABLET TWICE DAILY. 60 tablet  0  . ferrous sulfate 325 (65 FE) MG tablet TAKE 1 TABLET ONCE DAILY WITH BREAKFAST. 30 tablet 0  . furosemide (LASIX) 80 MG tablet Take 80 mg by mouth 2 (two) times daily.    . insulin glargine (LANTUS) 100 UNIT/ML injection Inject 65 Units into the skin 2 (two) times daily. 70 units in the morning and 60 units in the evening    . metroNIDAZOLE (METROGEL) 0.75 % gel Apply 1 application topically 2 (two) times daily as needed (rosacea).    . mometasone (ELOCON) 0.1 % lotion Apply 1 application topically daily as needed (eczema). EAR ECZEMA    . omeprazole (PRILOSEC) 20 MG capsule Take 20 mg by mouth 2 (two) times daily.    Marland Kitchen oxyCODONE-acetaminophen (PERCOCET) 10-325 MG per tablet Take 1 tablet by mouth every 6 (six) hours as needed for pain.     . polyethylene glycol (MIRALAX / GLYCOLAX) packet Take 17 g by mouth daily.    . rosuvastatin (CRESTOR) 20 MG tablet TAKE 1 TABLET AT 6:00PM. 30 tablet 6  . venlafaxine (EFFEXOR) 75 MG tablet Take 75 mg by mouth 2 (two) times daily.      No current facility-administered medications for this encounter.      Vitals:   02/04/16 1357  BP: 108/68  Pulse: 87  SpO2: 93%  Weight: 283 lb (128.4 kg)    PHYSICAL EXAM: General:  Elderly appearing. No resp difficulty, walked in with rolling  walker, daughter present.  HEENT: normal Neck: supple. JVP very hard to see. Appears elevated. Carotids 2+ bilaterally; no bruits. No lymphadenopathy or thryomegaly appreciated. Cor: PMI normal.  irregular rhythm, no rubs, gallops or murmurs. Lungs: clear Abdomen: markedly obese, soft, nontender, minimally distended.  No bruits or masses. Good bowel sounds. Extremities: no cyanosis, clubbing, rash,  R and LLE 1+ edema. Neuro: alert & orientedx3, cranial nerves grossly intact. Moves all 4 extremities w/o difficulty. Affect pleasant.   ASSESSMENT & PLAN:  1) Chronic systolic HF: NICM, EF 123XX123, RV nl (02/2014).  Repeat ECHO 09/2014 EF 20-25%. Repeat ECHO if EF still  down needs referral for ICD.  NYHA II-IIIb symptom. Volume status elevated. Continue lasix 80 mg twice a day. She will also take 2.5 mg metolazone for the next 2 days + 40 meq potassium for 2 days.   - Continue 18.75 coreg twice daily.  - Continue current Entresto 24-26 mg twice daily.  -Restart spironolactone 12.5 mg daily.   -- Reinforced the need and importance of daily weights, a low  sodium diet, and fluid restriction (less than 2 L a day). Instructed to call the HF clinic if weight increases more than 3 lbs overnight or 5 lbs in a week.  2) CAD: Moderate diffuse mid LAD disease of 40-60%. Septal perforator branch with ostial ~90%.  - No chest pain. Continue statin and BB. 3) CKD stage III: BMET today.   4) Afib: Chronic with rate control. Will continue Eliquis 5 mg BID and Coreg. Check CBC today  5) Falls- no recent falls.   6) Constipation- Has been evaluated by PCI   7) LBBB  Repeat ECHO.   Check CBC, BMET, TSH, BNP   Follow up 2 weeks to reassess volume and repeat BMET   Ignatius Kloos NP-C 02/04/2016

## 2016-02-13 ENCOUNTER — Ambulatory Visit (HOSPITAL_COMMUNITY)
Admission: RE | Admit: 2016-02-13 | Discharge: 2016-02-13 | Disposition: A | Discharge status: Home / Self Care | Payer: Medicare Other | Source: Ambulatory Visit | Attending: Adult Health | Admitting: Adult Health

## 2016-02-13 DIAGNOSIS — Z6841 Body Mass Index (BMI) 40.0 and over, adult: Secondary | ICD-10-CM | POA: Diagnosis not present

## 2016-02-13 DIAGNOSIS — I4891 Unspecified atrial fibrillation: Secondary | ICD-10-CM | POA: Insufficient documentation

## 2016-02-13 DIAGNOSIS — I110 Hypertensive heart disease with heart failure: Secondary | ICD-10-CM | POA: Insufficient documentation

## 2016-02-13 DIAGNOSIS — I5022 Chronic systolic (congestive) heart failure: Secondary | ICD-10-CM

## 2016-02-13 DIAGNOSIS — I071 Rheumatic tricuspid insufficiency: Secondary | ICD-10-CM | POA: Insufficient documentation

## 2016-02-13 DIAGNOSIS — E785 Hyperlipidemia, unspecified: Secondary | ICD-10-CM | POA: Insufficient documentation

## 2016-02-13 LAB — ECHOCARDIOGRAM COMPLETE
CHL CUP DOP CALC LVOT VTI: 13.5 cm
CHL CUP MV DEC (S): 165
CHL CUP RV SYS PRESS: 27 mmHg
CHL CUP TV REG PEAK VELOCITY: 243 cm/s
E decel time: 165 msec
FS: 17 % — AB (ref 28–44)
IVS/LV PW RATIO, ED: 0.89
LA ID, A-P, ES: 51 mm
LA diam index: 2.03 cm/m2
LA vol A4C: 81.7 ml
LA vol index: 34.2 mL/m2
LA vol: 86 mL
LEFT ATRIUM END SYS DIAM: 51 mm
LV dias vol index: 28 mL/m2
LV dias vol: 72 mL (ref 46–106)
LV sys vol index: 19 mL/m2
LV sys vol: 48 mL — AB (ref 14–42)
LVOT area: 3.14 cm2
LVOT diameter: 20 mm
LVOT peak grad rest: 2 mmHg
LVOTPV: 76.1 cm/s
LVOTSV: 42 mL
MV Peak grad: 4 mmHg
MV pk E vel: 96 m/s
PW: 14.2 mm — AB (ref 0.6–1.1)
Simpson's disk: 33
Stroke v: 23 ml
TAPSE: 14.8 mm
TR max vel: 243 cm/s

## 2016-02-13 NOTE — Progress Notes (Signed)
*  PRELIMINARY RESULTS* Echocardiogram 2D Echocardiogram has been performed.  Victoria Adams 02/13/2016, 2:55 PM

## 2016-02-19 ENCOUNTER — Telehealth (HOSPITAL_COMMUNITY): Payer: Self-pay | Admitting: Vascular Surgery

## 2016-02-19 ENCOUNTER — Ambulatory Visit (HOSPITAL_COMMUNITY)
Admission: RE | Admit: 2016-02-19 | Discharge: 2016-02-19 | Disposition: A | Discharge status: Home / Self Care | Payer: Medicare Other | Source: Ambulatory Visit | Attending: Internal Medicine | Admitting: Internal Medicine

## 2016-02-19 VITALS — BP 110/78 | HR 80 | Wt 278.2 lb

## 2016-02-19 DIAGNOSIS — J309 Allergic rhinitis, unspecified: Secondary | ICD-10-CM | POA: Diagnosis not present

## 2016-02-19 DIAGNOSIS — M545 Low back pain: Secondary | ICD-10-CM | POA: Diagnosis not present

## 2016-02-19 DIAGNOSIS — N183 Chronic kidney disease, stage 3 unspecified: Secondary | ICD-10-CM

## 2016-02-19 DIAGNOSIS — E559 Vitamin D deficiency, unspecified: Secondary | ICD-10-CM | POA: Insufficient documentation

## 2016-02-19 DIAGNOSIS — K219 Gastro-esophageal reflux disease without esophagitis: Secondary | ICD-10-CM | POA: Insufficient documentation

## 2016-02-19 DIAGNOSIS — M1990 Unspecified osteoarthritis, unspecified site: Secondary | ICD-10-CM | POA: Insufficient documentation

## 2016-02-19 DIAGNOSIS — F329 Major depressive disorder, single episode, unspecified: Secondary | ICD-10-CM | POA: Diagnosis not present

## 2016-02-19 DIAGNOSIS — I130 Hypertensive heart and chronic kidney disease with heart failure and stage 1 through stage 4 chronic kidney disease, or unspecified chronic kidney disease: Secondary | ICD-10-CM | POA: Insufficient documentation

## 2016-02-19 DIAGNOSIS — I252 Old myocardial infarction: Secondary | ICD-10-CM | POA: Insufficient documentation

## 2016-02-19 DIAGNOSIS — E669 Obesity, unspecified: Secondary | ICD-10-CM | POA: Diagnosis not present

## 2016-02-19 DIAGNOSIS — I5022 Chronic systolic (congestive) heart failure: Secondary | ICD-10-CM | POA: Diagnosis not present

## 2016-02-19 DIAGNOSIS — I2510 Atherosclerotic heart disease of native coronary artery without angina pectoris: Secondary | ICD-10-CM | POA: Diagnosis not present

## 2016-02-19 DIAGNOSIS — G8929 Other chronic pain: Secondary | ICD-10-CM | POA: Diagnosis not present

## 2016-02-19 DIAGNOSIS — I447 Left bundle-branch block, unspecified: Secondary | ICD-10-CM | POA: Diagnosis not present

## 2016-02-19 DIAGNOSIS — I429 Cardiomyopathy, unspecified: Secondary | ICD-10-CM | POA: Diagnosis not present

## 2016-02-19 DIAGNOSIS — D509 Iron deficiency anemia, unspecified: Secondary | ICD-10-CM | POA: Insufficient documentation

## 2016-02-19 DIAGNOSIS — I482 Chronic atrial fibrillation, unspecified: Secondary | ICD-10-CM

## 2016-02-19 DIAGNOSIS — K5900 Constipation, unspecified: Secondary | ICD-10-CM | POA: Insufficient documentation

## 2016-02-19 DIAGNOSIS — M8580 Other specified disorders of bone density and structure, unspecified site: Secondary | ICD-10-CM | POA: Insufficient documentation

## 2016-02-19 DIAGNOSIS — E1122 Type 2 diabetes mellitus with diabetic chronic kidney disease: Secondary | ICD-10-CM | POA: Diagnosis not present

## 2016-02-19 LAB — BASIC METABOLIC PANEL
ANION GAP: 10 (ref 5–15)
BUN: 21 mg/dL — ABNORMAL HIGH (ref 6–20)
CO2: 26 mmol/L (ref 22–32)
Calcium: 8.6 mg/dL — ABNORMAL LOW (ref 8.9–10.3)
Chloride: 100 mmol/L — ABNORMAL LOW (ref 101–111)
Creatinine, Ser: 1.59 mg/dL — ABNORMAL HIGH (ref 0.44–1.00)
GFR calc Af Amer: 36 mL/min — ABNORMAL LOW (ref >60)
GFR calc non Af Amer: 31 mL/min — ABNORMAL LOW (ref >60)
GLUCOSE: 59 mg/dL — AB (ref 65–99)
POTASSIUM: 3.9 mmol/L (ref 3.5–5.1)
Sodium: 136 mmol/L (ref 135–145)

## 2016-02-19 LAB — BRAIN NATRIURETIC PEPTIDE: B NATRIURETIC PEPTIDE 5: 136 pg/mL — AB (ref 0.0–100.0)

## 2016-02-19 MED ORDER — CARVEDILOL 25 MG PO TABS: 25.0000 mg | ORAL_TABLET | Freq: Two times a day (BID) | ORAL | 6 refills | Status: DC

## 2016-02-19 NOTE — Progress Notes (Signed)
Patient ID: Victoria Adams, female   DOB: [birth year], 75 y.o.   MRN: [id number] PCP: Dr. Gar Ponto ([facility] in [place]) Primary Cardiologist: Dr. Domenic Polite EP: Dr Caryl Comes   HPI: Victoria Adams is a 75 yo female with a history of chronic anemia, HTN, chronic A-Fib, nonischemic cardiomyopathy, DM2, CAD, chronic back pain and chronic systolic HF.   Admitted to the hospital 5/13-5/27/15 for A/C HF and NSTEMI. Troponin peaked at 6.7 and was taken for cath 08/24/13 showing moderate diffuse mid LAD and septal perforator branck with ostial ~90% occluded. Treated medically. History of chronic anemia and Hgb continued to drop and GI consulted and they did EGD showing no evidence of bleeding or pathology. Cr started rising and PICC was placed for CVP monitoring and co-oxs. She was placed on milrinone and once weaned off co-ox remained marginal in the mid 33s. Discharge weight 236 lbs.   She returns for HF follow up. Last visit she was volume overloaded and instructed to take metolazone for few days. Says weight went down. Mild dyspnea with exertion. Denies Orthopnea/PND. Weight at home trending down to 270-272 pounds. Not able to exercise. Taking all meds  She does not drive. Lives with her daughter.     ECHO (08/2013) EF 20-25%, RV normal            (02/2014) EF 25-30%, RV normal           (09/2014) EF 20-25% RV mildly dilated.            (02/2016) EF 20-25% RV normal    Labs: (09/11/13): Creatinine 1.3, K 4.2, BUN 29, dig level 0.4 (09/20/13) Creatinine 1.36 K 4.9  (10/03/13) K 5.8, creatinine 1.67, BUN 51 (10/11/13) K 5.0, creatinine 1.21, BUN 26 (11/26/13) K 3.7 Creatinine 1.0  01/10/14 K 4.2 Cr 1.07  (02/27/14) K 4.0, creatinine 1.01 (12/15) K 4.1, creatinine 1.14 (04/16/2014) K 4.0 Creatinine 1.29 Hgb 13.8  (09/13/2014): K 4.0 Creatinine 1.53  (12/26/2014) K 3.1 Creatinuine 2.46   SH: Lives in [place] with son and daughter, no ETOH and does not smoke, retired  FH: Mother deceased: CAD?, HTN, lung cancer, afib         Father deceased: DM2, lung cancer, HTN, CAD stents        3 daughters (1 has afib)        1 son: healthy and alive  ROS: All systems negative except as listed in HPI, PMH and Problem List.  Past Medical History:  Diagnosis Date  . Allergic rhinitis   . 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 Atrial fibrillation ([place])   . Chronic low back pain    Secondary to DJD  . Chronic systolic heart failure (HCC)    a. echo (5/15):  mod LVH, EF 20-25%, diff HK, severe LAE, mild RAE  . Coronary atherosclerosis-non obstructive    LHC (5/15):  EF 40-45% global HK; long LAD 40-60%, ostial 1st major septal perf 80-90%, ostial CFX ?%, mid AVCFX extensive Ca2+, dist PDA 50%  . Depression   . Essential hypertension, benign   . Fatty liver disease, nonalcoholic   . GERD (gastroesophageal reflux disease)   . Iron deficiency anemia   . Mitral regurgitation   . NICM (nonischemic cardiomyopathy) ([place])    a. echo (5/15):  mod LVH, EF 20-25%, diff HK, severe LAE, mild RAE  . NSTEMI (non-ST elevated myocardial infarction) ([place]) may 2015   No CAD  . Obesity   . Osteopenia   .  PONV (postoperative nausea and vomiting)   . Rosacea   . Type 2 diabetes mellitus ([place])   . Vitamin D deficiency     Current Outpatient Prescriptions  Medication Sig Dispense Refill  . Calcium Carb-Cholecalciferol (CALCIUM 600 + D PO) Take 1 tablet by mouth 2 (two) times daily.     . carvedilol (COREG) 12.5 MG tablet Take 1.5 tablets (18.75 mg total) by mouth 2 (two) times daily with a meal. 270 tablet 6  . dimenhyDRINATE (DRAMAMINE) 50 MG tablet Take 50 mg by mouth every 8 (eight) hours as needed for nausea.    . diphenhydrAMINE (SOMINEX) 25 MG tablet Take 25 mg by mouth at bedtime as needed for itching or sleep.    Marland Kitchen doxycycline (VIBRAMYCIN) 100 MG capsule Take 100-200 mg by mouth 2 (two) times daily as needed (rosacea).     Marland Kitchen ELIQUIS 5 MG TABS tablet TAKE (1) TABLET TWICE DAILY. 60 tablet 3  . ENTRESTO 24-26 MG TAKE (1) TABLET  TWICE DAILY. 60 tablet 0  . ferrous sulfate 325 (65 FE) MG tablet TAKE 1 TABLET ONCE DAILY WITH BREAKFAST. 30 tablet 0  . furosemide (LASIX) 80 MG tablet Take 80 mg by mouth 2 (two) times daily.    . insulin glargine (LANTUS) 100 UNIT/ML injection Inject 65 Units into the skin 2 (two) times daily. 70 units in the morning and 60 units in the evening    . metolazone (ZAROXOLYN) 2.5 MG tablet Take 2.5 mg (1 tablet) once daily for two days. 5 tablet 0  . metroNIDAZOLE (METROGEL) 0.75 % gel Apply 1 application topically 2 (two) times daily as needed (rosacea).    . mometasone (ELOCON) 0.1 % lotion Apply 1 application topically daily as needed (eczema). EAR ECZEMA    . omeprazole (PRILOSEC) 20 MG capsule Take 20 mg by mouth 2 (two) times daily.    Marland Kitchen oxyCODONE-acetaminophen (PERCOCET) 10-325 MG per tablet Take 1 tablet by mouth every 6 (six) hours as needed for pain.     . polyethylene glycol (MIRALAX / GLYCOLAX) packet Take 17 g by mouth daily.    . potassium chloride SA (K-DUR,KLOR-CON) 20 MEQ tablet Take 2 tablets (40 mEq total) by mouth daily. Take 40 MEQ (2 tablets) by mouth with Metolazone for TWO DAYS 10 tablet 0  . rosuvastatin (CRESTOR) 20 MG tablet TAKE 1 TABLET AT 6:00PM. 30 tablet 6  . spironolactone (ALDACTONE) 25 MG tablet Take 0.5 tablets (12.5 mg total) by mouth daily. 45 tablet 3  . venlafaxine (EFFEXOR) 75 MG tablet Take 75 mg by mouth 2 (two) times daily.      No current facility-administered medications for this encounter.      Vitals:   02/19/16 1546  BP: 110/78  Pulse: 80  SpO2: 91%  Weight: 278 lb 3.2 oz (126.2 kg)    PHYSICAL EXAM: General:  Elderly appearing. No resp difficulty. Arrived in a wheel chair. 2 daughters present.   HEENT: normal Neck: supple. JVP very hard to see. Does not appear elevated. Carotids 2+ bilaterally; no bruits. No lymphadenopathy or thryomegaly appreciated. Cor: PMI normal.  irregular rhythm, no rubs, gallops or murmurs. Lungs:  clear Abdomen: markedly obese, soft, nontender, minimally distended.  No bruits or masses. Good bowel sounds. Extremities: no cyanosis, clubbing, rash,  R and LLE trace edema.  Neuro: alert & orientedx3, cranial nerves grossly intact. Moves all 4 extremities w/o difficulty. Affect pleasant.   ASSESSMENT & PLAN: 1) Chronic systolic HF: NICM, EF 123XX123, RV nl (02/2014).  ECHO 09/2014 EF 20-25%. ECHO 01/2016 EF 20-25%. Refer to EP for CRT-D   NYHA II-IIIb symptom. Volume status stable. Continue lasix 80 mg twice a day.  - Increase coreg to 25 mg twice a day. reg twice daily.  - Continue current Entresto 24-26 mg twice daily.  -Continue spironolactone 12.5 mg daily.   - Continue BMET/BNP today.  -- Reinforced the need and importance of daily weights, a low sodium diet, and fluid restriction (less than 2 L a day). Instructed to call the HF clinic if weight increases more than 3 lbs overnight or 5 lbs in a week.  2) CAD: Moderate diffuse mid LAD disease of 40-60%. Septal perforator branch with ostial ~90%.  - No chest pain. Continue statin and BB. 3) CKD stage III: BMET today.   4) Afib: Chronic with rate control. Will continue Eliquis 5 mg BID and Coreg. Check CBC today  5) Falls- no recent falls.   6) Constipation- Has been evaluated by PCI   7) LBBB- EKG 01/2016 QRS 160 ms    Refer to EP for CRT-D. Follow up in 6 weeks     Luisdavid Hamblin NP-C 02/19/2016

## 2016-02-19 NOTE — Patient Instructions (Signed)
INCREASE Carvedilol (Coreg) to 25mg  twice daily. 25mg  tablet sent in to preferred pharmacy.  Routine lab work today. Will notify you of abnormal results, otherwise no news is good news!  Will refer you to electrophysiology at [facility] with Dr. Caryl Comes. Address: [address]), [place], [place] [address]  Phone: [phone]  Follow up 6 weeks with Amy Clegg NP-C.  Do the following things EVERYDAY: 1) Weigh yourself in the morning before breakfast. Write it down and keep it in a log. 2) Take your medicines as prescribed 3) Eat low salt foods-Limit salt (sodium) to 2000 mg per day.  4) Stay as active as you can everyday 5) Limit all fluids for the day to less than 2 liters

## 2016-02-19 NOTE — Telephone Encounter (Signed)
Sent Victoria Adams in basket message to call pt to make f/u appt w/ Dr. Caryl Comes

## 2016-02-23 ENCOUNTER — Other Ambulatory Visit: Payer: Self-pay | Admitting: *Deleted

## 2016-02-23 MED ORDER — SACUBITRIL-VALSARTAN 24-26 MG PO TABS: ORAL_TABLET | ORAL | 3 refills | Status: DC

## 2016-03-02 ENCOUNTER — Encounter: Payer: Self-pay | Admitting: Internal Medicine

## 2016-03-02 ENCOUNTER — Ambulatory Visit (INDEPENDENT_AMBULATORY_CARE_PROVIDER_SITE_OTHER): Payer: Medicare Other | Admitting: Internal Medicine

## 2016-03-02 VITALS — BP 128/68 | HR 77 | Ht 66.0 in | Wt 280.2 lb

## 2016-03-02 DIAGNOSIS — I482 Chronic atrial fibrillation: Secondary | ICD-10-CM

## 2016-03-02 DIAGNOSIS — I5022 Chronic systolic (congestive) heart failure: Secondary | ICD-10-CM

## 2016-03-02 DIAGNOSIS — I428 Other cardiomyopathies: Secondary | ICD-10-CM

## 2016-03-02 DIAGNOSIS — I4821 Permanent atrial fibrillation: Secondary | ICD-10-CM

## 2016-03-02 NOTE — Progress Notes (Signed)
Patient Care Team: Caryl Bis, MD as PCP - General (Unknown Physician Specialty)   HPI  Victoria Adams is a 75 y.o. female Seen in follow-up for the consideration of [place] ICD/CRT. She has long-standing atrial fibrillation. She has a nonischemic cardiomyopathy. Most recent echocardiogram 11/17 EF 20-25%. She is limited both by her back as well as shortness of breath. This is at 20-30 feet. He does not have orthopnea or nocturnal dyspnea. She does have some peripheral edema she's had no interval syncope.    Records and Results Reviewed  Past Medical History:  Diagnosis Date  . Allergic rhinitis   . 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 Atrial fibrillation ([place])   . Chronic low back pain    Secondary to DJD  . Chronic systolic heart failure (HCC)    a. echo (5/15):  mod LVH, EF 20-25%, diff HK, severe LAE, mild RAE  . Coronary atherosclerosis-non obstructive    LHC (5/15):  EF 40-45% global HK; long LAD 40-60%, ostial 1st major septal perf 80-90%, ostial CFX ?%, mid AVCFX extensive Ca2+, dist PDA 50%  . Depression   . Essential hypertension, benign   . Fatty liver disease, nonalcoholic   . GERD (gastroesophageal reflux disease)   . Iron deficiency anemia   . Mitral regurgitation   . NICM (nonischemic cardiomyopathy) ([place])    a. echo (5/15):  mod LVH, EF 20-25%, diff HK, severe LAE, mild RAE  . NSTEMI (non-ST elevated myocardial infarction) ([place]) may 2015   No CAD  . Obesity   . Osteopenia   . PONV (postoperative nausea and vomiting)   . Rosacea   . Type 2 diabetes mellitus ([place])   . Vitamin D deficiency     Past Surgical History:  Procedure Laterality Date  . ABDOMINAL HYSTERECTOMY    . APPENDECTOMY    . BREAST BIOPSY     RIGHT  . CARDIAC CATHETERIZATION  2010   LAD 50%, CFX 30%, RCA 40%; EF by echo 25-35%  . CATARACT EXTRACTION Bilateral   . CHOLECYSTECTOMY    . COLONOSCOPY WITH PROPOFOL N/A 12/20/2013   Procedure: COLONOSCOPY WITH PROPOFOL;  Surgeon: Milus Banister, MD;  Location: [facility];  Service: Endoscopy;  Laterality: N/A;  . ESOPHAGOGASTRODUODENOSCOPY N/A 08/25/2013   Procedure: ESOPHAGOGASTRODUODENOSCOPY (EGD);  Surgeon: Milus Banister, MD;  Location: [facility];  Service: Endoscopy;  Laterality: N/A;  . LEFT HEART CATHETERIZATION WITH CORONARY ANGIOGRAM N/A 08/24/2013   Procedure: LEFT HEART CATHETERIZATION WITH CORONARY ANGIOGRAM;  Surgeon: Leonie Man, MD;  Location: [facility] CATH LAB;  Service: Cardiovascular;  Laterality: N/A;  . ROTATOR CUFF REPAIR     RIGHT SHOULDER  . TONSILLECTOMY  yrs ago  . TOTAL ABDOMINAL HYSTERECTOMY W/ BILATERAL SALPINGOOPHORECTOMY     for dermoid tumor    Current Outpatient Prescriptions  Medication Sig Dispense Refill  . Calcium Carb-Cholecalciferol (CALCIUM 600 + D PO) Take 1 tablet by mouth 2 (two) times daily.     . carvedilol (COREG) 25 MG tablet Take 1 tablet (25 mg total) by mouth 2 (two) times daily with a meal. 60 tablet 6  . dimenhyDRINATE (DRAMAMINE) 50 MG tablet Take 50 mg by mouth every 8 (eight) hours as needed for nausea.    . diphenhydrAMINE (SOMINEX) 25 MG tablet Take 25 mg by mouth at bedtime as needed for itching or sleep.    Marland Kitchen doxycycline (VIBRAMYCIN) 100 MG capsule Take 100-200 mg by mouth  2 (two) times daily as needed (rosacea).     Marland Kitchen ELIQUIS 5 MG TABS tablet TAKE (1) TABLET TWICE DAILY. 60 tablet 3  . ferrous sulfate 325 (65 FE) MG tablet TAKE 1 TABLET ONCE DAILY WITH BREAKFAST. 30 tablet 0  . furosemide (LASIX) 80 MG tablet Take 80 mg by mouth 2 (two) times daily.    . insulin glargine (LANTUS) 100 UNIT/ML injection Inject 65 Units into the skin 2 (two) times daily. 70 units in the morning and 60 units in the evening    . metolazone (ZAROXOLYN) 2.5 MG tablet Take 2.5 mg (1 tablet) once daily for two days. 5 tablet 0  . metroNIDAZOLE (METROGEL) 0.75 % gel Apply 1 application topically 2 (two) times daily as needed (rosacea).    . mometasone (ELOCON) 0.1 % lotion Apply 1  application topically daily as needed (eczema). EAR ECZEMA    . omeprazole (PRILOSEC) 20 MG capsule Take 20 mg by mouth 2 (two) times daily.    Marland Kitchen oxyCODONE-acetaminophen (PERCOCET) 10-325 MG per tablet Take 1 tablet by mouth every 6 (six) hours as needed for pain.     . polyethylene glycol (MIRALAX / GLYCOLAX) packet Take 17 g by mouth daily.    . potassium chloride SA (K-DUR,KLOR-CON) 20 MEQ tablet Take 2 tablets (40 mEq total) by mouth daily. Take 40 MEQ (2 tablets) by mouth with Metolazone for TWO DAYS 10 tablet 0  . rosuvastatin (CRESTOR) 20 MG tablet TAKE 1 TABLET AT 6:00PM. 30 tablet 6  . sacubitril-valsartan (ENTRESTO) 24-26 MG TAKE (1) TABLET TWICE DAILY. 60 tablet 3  . spironolactone (ALDACTONE) 25 MG tablet Take 0.5 tablets (12.5 mg total) by mouth daily. 45 tablet 3  . venlafaxine (EFFEXOR) 75 MG tablet Take 75 mg by mouth 2 (two) times daily.      No current facility-administered medications for this visit.     Allergies  Allergen Reactions  . Codeine Nausea And Vomiting  . Tylox [Oxycodone-Acetaminophen]     Vision closing in & heart palpitations.       Review of Systems negative except from HPI and PMH  Physical Exam BP 128/68   Pulse 77   Ht 5\' 6"  (1.676 m)   Wt 280 lb 3.2 oz (127.1 kg)   SpO2 97%   BMI 45.23 kg/m  Well developed and well nourished in no acute distress HENT normal E scleral and icterus clear Neck Supple JVP flat; carotids brisk and full Clear to ausculation IRRegular rate and rhythm, no murmurs gallops or rub Soft with active bowel sounds No clubbing cyanosis Trace Edema Alert and oriented, grossly normal motor and sensory function Skin Warm and Dry  ECG with atrial fibrillation heart rate 78 Intervals-/15/43 Nonspecific IVCD R in lead aVL and Rs in lead V1. Reviewing old ECGs today demonstrated a more left bundle branch block pattern with a monophasic R-wave in lead 1. There is striking notching throughout the anterior precordium as well  as the inferior leads  Assess withoument and  Plan  Congestive heart failure-chronic-systolic  Nonischemic cardio myopathy   Atrial fibrillation-permanent  Chronic back pain  Left bundle branch block like IVCD   The patient has a left bundle branchlike IVCD. Work from others have identified this as a subgroup of patients with IVCD that her prone to long Q-LV intervals and thus predictive benefit for resynchronization. Unfortunately, her likelihood of benefit as for by her atrial fibrillation although we hope to address that subsequent AV junction ablation in the event  that we cannot control her heart rates adequately.  Have reviewed the potential benefits and risks of ICD implantation including but not limited to death, perforation of heart or lung, lead dislodgement, infection,  device malfunction and inappropriate shocks.  The patient and familyexpress understanding  and are willing to proceed.    her chronic back pain should not limit our ability to do this without general anesthesia I don't think. She was able to have a colonoscopy without difficulty  More than 50% of 45 min was spent in counseling related to the above   Current medicines are reviewed at length with the patient today .  The patient does not have concerns regarding medicines.

## 2016-03-02 NOTE — Patient Instructions (Signed)
Medication Instructions: - Your physician recommends that you continue on your current medications as directed. Please refer to the Current Medication list given to you today.  Labwork: - none today  Procedures/Testing: - Your physician has recommended that you have a defibrillator inserted. An implantable cardioverter defibrillator (ICD) is a small device that is placed in your chest or, in rare cases, your abdomen. This device uses electrical pulses or shocks to help control life-threatening, irregular heartbeats that could lead the heart to suddenly stop beating (sudden cardiac arrest). Leads are attached to the ICD that goes into your heart. This is done in the hospital and usually requires an overnight stay.   ** we will schedule this for Friday 04/16/16 **  Follow-Up: - Your physician recommends that you schedule a follow-up appointment in: about 14 days (from 04/16/16) for a wound check with the device clinic.   Any Additional Special Instructions Will Be Listed Below (If Applicable).     If you need a refill on your cardiac medications before your next appointment, please call your pharmacy.

## 2016-04-01 ENCOUNTER — Encounter (HOSPITAL_COMMUNITY): Payer: Medicare Other

## 2016-04-06 ENCOUNTER — Encounter: Payer: Self-pay | Admitting: *Deleted

## 2016-04-16 ENCOUNTER — Encounter (HOSPITAL_COMMUNITY)
Admission: RE | Disposition: A | Discharge status: Home / Self Care | Payer: Self-pay | Source: Ambulatory Visit | Attending: Internal Medicine

## 2016-04-16 ENCOUNTER — Ambulatory Visit (HOSPITAL_COMMUNITY)
Admission: RE | Admit: 2016-04-16 | Discharge: 2016-04-17 | Disposition: A | Discharge status: Home / Self Care | Payer: Medicare Other | Source: Ambulatory Visit | Attending: Internal Medicine | Admitting: Internal Medicine

## 2016-04-16 DIAGNOSIS — I252 Old myocardial infarction: Secondary | ICD-10-CM | POA: Insufficient documentation

## 2016-04-16 DIAGNOSIS — F329 Major depressive disorder, single episode, unspecified: Secondary | ICD-10-CM | POA: Diagnosis not present

## 2016-04-16 DIAGNOSIS — N183 Chronic kidney disease, stage 3 unspecified: Secondary | ICD-10-CM | POA: Diagnosis present

## 2016-04-16 DIAGNOSIS — I428 Other cardiomyopathies: Secondary | ICD-10-CM | POA: Diagnosis not present

## 2016-04-16 DIAGNOSIS — G8929 Other chronic pain: Secondary | ICD-10-CM | POA: Insufficient documentation

## 2016-04-16 DIAGNOSIS — K219 Gastro-esophageal reflux disease without esophagitis: Secondary | ICD-10-CM | POA: Diagnosis not present

## 2016-04-16 DIAGNOSIS — M4690 Unspecified inflammatory spondylopathy, site unspecified: Secondary | ICD-10-CM | POA: Diagnosis not present

## 2016-04-16 DIAGNOSIS — I482 Chronic atrial fibrillation: Secondary | ICD-10-CM | POA: Insufficient documentation

## 2016-04-16 DIAGNOSIS — E1122 Type 2 diabetes mellitus with diabetic chronic kidney disease: Secondary | ICD-10-CM | POA: Diagnosis not present

## 2016-04-16 DIAGNOSIS — E559 Vitamin D deficiency, unspecified: Secondary | ICD-10-CM | POA: Insufficient documentation

## 2016-04-16 DIAGNOSIS — D509 Iron deficiency anemia, unspecified: Secondary | ICD-10-CM | POA: Diagnosis not present

## 2016-04-16 DIAGNOSIS — I2510 Atherosclerotic heart disease of native coronary artery without angina pectoris: Secondary | ICD-10-CM | POA: Insufficient documentation

## 2016-04-16 DIAGNOSIS — M545 Low back pain: Secondary | ICD-10-CM | POA: Insufficient documentation

## 2016-04-16 DIAGNOSIS — J309 Allergic rhinitis, unspecified: Secondary | ICD-10-CM | POA: Insufficient documentation

## 2016-04-16 DIAGNOSIS — M8580 Other specified disorders of bone density and structure, unspecified site: Secondary | ICD-10-CM | POA: Diagnosis not present

## 2016-04-16 DIAGNOSIS — Z885 Allergy status to narcotic agent status: Secondary | ICD-10-CM | POA: Insufficient documentation

## 2016-04-16 DIAGNOSIS — I5022 Chronic systolic (congestive) heart failure: Secondary | ICD-10-CM | POA: Diagnosis present

## 2016-04-16 DIAGNOSIS — I4891 Unspecified atrial fibrillation: Secondary | ICD-10-CM | POA: Diagnosis present

## 2016-04-16 DIAGNOSIS — Z6841 Body Mass Index (BMI) 40.0 and over, adult: Secondary | ICD-10-CM | POA: Diagnosis not present

## 2016-04-16 DIAGNOSIS — I447 Left bundle-branch block, unspecified: Secondary | ICD-10-CM | POA: Diagnosis present

## 2016-04-16 DIAGNOSIS — K760 Fatty (change of) liver, not elsewhere classified: Secondary | ICD-10-CM | POA: Diagnosis not present

## 2016-04-16 DIAGNOSIS — I340 Nonrheumatic mitral (valve) insufficiency: Secondary | ICD-10-CM | POA: Insufficient documentation

## 2016-04-16 DIAGNOSIS — I130 Hypertensive heart and chronic kidney disease with heart failure and stage 1 through stage 4 chronic kidney disease, or unspecified chronic kidney disease: Secondary | ICD-10-CM | POA: Diagnosis not present

## 2016-04-16 DIAGNOSIS — I10 Essential (primary) hypertension: Secondary | ICD-10-CM | POA: Diagnosis present

## 2016-04-16 DIAGNOSIS — Z7901 Long term (current) use of anticoagulants: Secondary | ICD-10-CM | POA: Insufficient documentation

## 2016-04-16 DIAGNOSIS — L719 Rosacea, unspecified: Secondary | ICD-10-CM | POA: Insufficient documentation

## 2016-04-16 DIAGNOSIS — Z959 Presence of cardiac and vascular implant and graft, unspecified: Secondary | ICD-10-CM

## 2016-04-16 HISTORY — PX: EP IMPLANTABLE DEVICE: SHX172B

## 2016-04-16 LAB — CBC
HCT: 40.3 % (ref 36.0–46.0)
Hemoglobin: 13.1 g/dL (ref 12.0–15.0)
MCH: 29.5 pg (ref 26.0–34.0)
MCHC: 32.5 g/dL (ref 30.0–36.0)
MCV: 90.8 fL (ref 78.0–100.0)
PLATELETS: 180 10*3/uL (ref 150–400)
RBC: 4.44 MIL/uL (ref 3.87–5.11)
RDW: 13.3 % (ref 11.5–15.5)
WBC: 10.1 10*3/uL (ref 4.0–10.5)

## 2016-04-16 LAB — GLUCOSE, CAPILLARY
GLUCOSE-CAPILLARY: 92 mg/dL (ref 65–99)
Glucose-Capillary: 118 mg/dL — ABNORMAL HIGH (ref 65–99)
Glucose-Capillary: 139 mg/dL — ABNORMAL HIGH (ref 65–99)

## 2016-04-16 LAB — BASIC METABOLIC PANEL
Anion gap: 9 (ref 5–15)
BUN: 20 mg/dL (ref 6–20)
CO2: 27 mmol/L (ref 22–32)
Calcium: 9.2 mg/dL (ref 8.9–10.3)
Chloride: 103 mmol/L (ref 101–111)
Creatinine, Ser: 1.52 mg/dL — ABNORMAL HIGH (ref 0.44–1.00)
GFR calc Af Amer: 38 mL/min — ABNORMAL LOW (ref >60)
GFR, EST NON AFRICAN AMERICAN: 33 mL/min — AB (ref >60)
GLUCOSE: 121 mg/dL — AB (ref 65–99)
POTASSIUM: 4.2 mmol/L (ref 3.5–5.1)
SODIUM: 139 mmol/L (ref 135–145)

## 2016-04-16 LAB — SURGICAL PCR SCREEN
MRSA, PCR: NEGATIVE
STAPHYLOCOCCUS AUREUS: POSITIVE — AB

## 2016-04-16 SURGERY — BIV ICD INSERTION CRT-D
Anesthesia: LOCAL

## 2016-04-16 MED ORDER — HEPARIN (PORCINE) IN NACL 2-0.9 UNIT/ML-% IJ SOLN
INTRAMUSCULAR | Status: AC
  Filled 2016-04-16: qty 500

## 2016-04-16 MED ORDER — SODIUM CHLORIDE 0.9 % IV SOLN
INTRAVENOUS | Status: DC
  Administered 2016-04-16: 11:00:00 via INTRAVENOUS

## 2016-04-16 MED ORDER — MIDAZOLAM HCL 5 MG/5ML IJ SOLN
INTRAMUSCULAR | Status: AC
  Filled 2016-04-16: qty 5

## 2016-04-16 MED ORDER — CHLORHEXIDINE GLUCONATE 4 % EX LIQD: 60.0000 mL | Freq: Once | CUTANEOUS | Status: DC

## 2016-04-16 MED ORDER — SACUBITRIL-VALSARTAN 24-26 MG PO TABS
1.0000 | ORAL_TABLET | Freq: Two times a day (BID) | ORAL | Status: DC
  Administered 2016-04-16 – 2016-04-17 (×2): 1 via ORAL
  Filled 2016-04-16 (×2): qty 1

## 2016-04-16 MED ORDER — MUPIROCIN 2 % EX OINT
1.0000 "application " | TOPICAL_OINTMENT | Freq: Once | CUTANEOUS | Status: AC
  Administered 2016-04-16: 1 via TOPICAL

## 2016-04-16 MED ORDER — IOPAMIDOL (ISOVUE-370) INJECTION 76%
INTRAVENOUS | Status: DC | PRN
  Administered 2016-04-16: 10 mL via INTRAVENOUS

## 2016-04-16 MED ORDER — PANTOPRAZOLE SODIUM 40 MG PO TBEC
40.0000 mg | DELAYED_RELEASE_TABLET | Freq: Every day | ORAL | Status: DC
  Administered 2016-04-16 – 2016-04-17 (×2): 40 mg via ORAL
  Filled 2016-04-16 (×2): qty 1

## 2016-04-16 MED ORDER — FENTANYL CITRATE (PF) 100 MCG/2ML IJ SOLN
INTRAMUSCULAR | Status: DC | PRN
  Administered 2016-04-16: 25 ug via INTRAVENOUS
  Administered 2016-04-16: 12.5 ug via INTRAVENOUS

## 2016-04-16 MED ORDER — IOPAMIDOL (ISOVUE-370) INJECTION 76%
INTRAVENOUS | Status: AC
  Filled 2016-04-16: qty 50

## 2016-04-16 MED ORDER — VENLAFAXINE HCL 75 MG PO TABS
75.0000 mg | ORAL_TABLET | Freq: Two times a day (BID) | ORAL | Status: DC
  Administered 2016-04-17: 75 mg via ORAL
  Filled 2016-04-16 (×2): qty 1

## 2016-04-16 MED ORDER — CEFAZOLIN IN D5W 1 GM/50ML IV SOLN
1.0000 g | Freq: Four times a day (QID) | INTRAVENOUS | Status: AC
  Administered 2016-04-16 – 2016-04-17 (×3): 1 g via INTRAVENOUS
  Filled 2016-04-16 (×3): qty 50

## 2016-04-16 MED ORDER — OXYCODONE-ACETAMINOPHEN 5-325 MG PO TABS
1.0000 | ORAL_TABLET | Freq: Four times a day (QID) | ORAL | Status: DC | PRN
  Administered 2016-04-16 – 2016-04-17 (×2): 1 via ORAL
  Filled 2016-04-16 (×2): qty 1

## 2016-04-16 MED ORDER — INSULIN GLARGINE 100 UNIT/ML ~~LOC~~ SOLN: 40.0000 [IU] | Freq: Two times a day (BID) | SUBCUTANEOUS | Status: DC

## 2016-04-16 MED ORDER — LIDOCAINE HCL (PF) 1 % IJ SOLN
INTRAMUSCULAR | Status: AC
  Filled 2016-04-16: qty 30

## 2016-04-16 MED ORDER — DIGOXIN 125 MCG PO TABS
0.1250 mg | ORAL_TABLET | Freq: Every day | ORAL | Status: DC
  Filled 2016-04-16: qty 1

## 2016-04-16 MED ORDER — SODIUM CHLORIDE 0.9 % IR SOLN
80.0000 mg | Status: AC
  Administered 2016-04-16: 80 mg

## 2016-04-16 MED ORDER — ONDANSETRON HCL 4 MG/2ML IJ SOLN: 4.0000 mg | Freq: Four times a day (QID) | INTRAMUSCULAR | Status: DC | PRN

## 2016-04-16 MED ORDER — INSULIN GLARGINE 100 UNIT/ML ~~LOC~~ SOLN
80.0000 [IU] | Freq: Every day | SUBCUTANEOUS | Status: DC
  Administered 2016-04-17: 80 [IU] via SUBCUTANEOUS
  Filled 2016-04-16: qty 0.8

## 2016-04-16 MED ORDER — INSULIN GLARGINE 100 UNIT/ML ~~LOC~~ SOLN
40.0000 [IU] | Freq: Every day | SUBCUTANEOUS | Status: DC
  Administered 2016-04-16: 40 [IU] via SUBCUTANEOUS
  Filled 2016-04-16: qty 0.4

## 2016-04-16 MED ORDER — DEXTROSE 5 % IV SOLN
3.0000 g | INTRAVENOUS | Status: AC
  Administered 2016-04-16: 3 g via INTRAVENOUS
  Filled 2016-04-16: qty 3000

## 2016-04-16 MED ORDER — ACETAMINOPHEN 325 MG PO TABS: 325.0000 mg | ORAL_TABLET | ORAL | Status: DC | PRN

## 2016-04-16 MED ORDER — MUPIROCIN 2 % EX OINT
TOPICAL_OINTMENT | CUTANEOUS | Status: AC
  Administered 2016-04-16: 1 via TOPICAL
  Filled 2016-04-16: qty 22

## 2016-04-16 MED ORDER — DIGOXIN 125 MCG PO TABS: 0.1250 mg | ORAL_TABLET | Freq: Every day | ORAL | Status: DC

## 2016-04-16 MED ORDER — HEPARIN (PORCINE) IN NACL 2-0.9 UNIT/ML-% IJ SOLN
INTRAMUSCULAR | Status: DC | PRN
  Administered 2016-04-16: 13:00:00

## 2016-04-16 MED ORDER — MIDAZOLAM HCL 5 MG/5ML IJ SOLN
INTRAMUSCULAR | Status: DC | PRN
  Administered 2016-04-16 (×2): 2 mg via INTRAVENOUS
  Administered 2016-04-16: 1 mg via INTRAVENOUS

## 2016-04-16 MED ORDER — OXYCODONE-ACETAMINOPHEN 10-325 MG PO TABS: 1.0000 | ORAL_TABLET | Freq: Four times a day (QID) | ORAL | Status: DC | PRN

## 2016-04-16 MED ORDER — OXYCODONE HCL 5 MG PO TABS
5.0000 mg | ORAL_TABLET | Freq: Four times a day (QID) | ORAL | Status: DC | PRN
  Administered 2016-04-16 – 2016-04-17 (×2): 5 mg via ORAL
  Filled 2016-04-16 (×3): qty 1

## 2016-04-16 MED ORDER — INFLUENZA VAC SPLIT QUAD 0.5 ML IM SUSY
0.5000 mL | PREFILLED_SYRINGE | INTRAMUSCULAR | Status: AC
  Administered 2016-04-17: 0.5 mL via INTRAMUSCULAR
  Filled 2016-04-16: qty 0.5

## 2016-04-16 MED ORDER — GENTAMICIN SULFATE 40 MG/ML IJ SOLN
INTRAMUSCULAR | Status: AC
  Filled 2016-04-16: qty 2

## 2016-04-16 MED ORDER — DOXYCYCLINE HYCLATE 100 MG PO TABS
100.0000 mg | ORAL_TABLET | Freq: Two times a day (BID) | ORAL | Status: DC | PRN
Start: 2016-04-16 — End: 2016-04-17

## 2016-04-16 MED ORDER — METOPROLOL SUCCINATE ER 25 MG PO TB24
25.0000 mg | ORAL_TABLET | Freq: Every day | ORAL | Status: DC
  Administered 2016-04-16 – 2016-04-17 (×2): 25 mg via ORAL
  Filled 2016-04-16 (×2): qty 1

## 2016-04-16 MED ORDER — FENTANYL CITRATE (PF) 100 MCG/2ML IJ SOLN
INTRAMUSCULAR | Status: AC
  Filled 2016-04-16: qty 2

## 2016-04-16 MED ORDER — CARVEDILOL 25 MG PO TABS
25.0000 mg | ORAL_TABLET | Freq: Two times a day (BID) | ORAL | Status: DC
  Administered 2016-04-16 – 2016-04-17 (×2): 25 mg via ORAL
  Filled 2016-04-16 (×2): qty 1

## 2016-04-16 MED ORDER — SPIRONOLACTONE 25 MG PO TABS
12.5000 mg | ORAL_TABLET | Freq: Every day | ORAL | Status: DC
  Administered 2016-04-17: 12.5 mg via ORAL
  Filled 2016-04-16: qty 1

## 2016-04-16 MED ORDER — DIGOXIN 250 MCG PO TABS: 0.2500 mg | ORAL_TABLET | Freq: Three times a day (TID) | ORAL | Status: DC

## 2016-04-16 MED ORDER — POLYETHYLENE GLYCOL 3350 17 G PO PACK
17.0000 g | PACK | Freq: Every day | ORAL | Status: DC
  Administered 2016-04-16 – 2016-04-17 (×2): 17 g via ORAL
  Filled 2016-04-16 (×2): qty 1

## 2016-04-16 MED ORDER — POTASSIUM CHLORIDE CRYS ER 20 MEQ PO TBCR
40.0000 meq | EXTENDED_RELEASE_TABLET | Freq: Every day | ORAL | Status: DC
  Administered 2016-04-17: 40 meq via ORAL
  Filled 2016-04-16: qty 2

## 2016-04-16 MED ORDER — LIDOCAINE HCL (PF) 1 % IJ SOLN
INTRAMUSCULAR | Status: DC | PRN
  Administered 2016-04-16: 50 mL via INTRADERMAL

## 2016-04-16 MED ORDER — ROSUVASTATIN CALCIUM 20 MG PO TABS
20.0000 mg | ORAL_TABLET | Freq: Every day | ORAL | Status: DC
  Administered 2016-04-16 – 2016-04-17 (×2): 20 mg via ORAL
  Filled 2016-04-16 (×2): qty 1

## 2016-04-16 MED ORDER — FUROSEMIDE 80 MG PO TABS
80.0000 mg | ORAL_TABLET | Freq: Two times a day (BID) | ORAL | Status: DC
  Administered 2016-04-16 – 2016-04-17 (×2): 80 mg via ORAL
  Filled 2016-04-16 (×2): qty 1

## 2016-04-16 MED ORDER — SODIUM CHLORIDE 0.9 % IV SOLN
INTRAVENOUS | Status: AC
  Administered 2016-04-16: 15:00:00 via INTRAVENOUS

## 2016-04-16 MED ORDER — MOMETASONE FUROATE 0.1 % EX CREA
1.0000 "application " | TOPICAL_CREAM | Freq: Every day | CUTANEOUS | Status: DC | PRN
  Filled 2016-04-16: qty 15

## 2016-04-16 MED ORDER — DIGOXIN 250 MCG PO TABS
0.2500 mg | ORAL_TABLET | Freq: Three times a day (TID) | ORAL | Status: AC
  Administered 2016-04-16 – 2016-04-17 (×3): 0.25 mg via ORAL
  Filled 2016-04-16: qty 1
  Filled 2016-04-16: qty 2
  Filled 2016-04-16: qty 1
  Filled 2016-04-16: qty 2
  Filled 2016-04-16: qty 1
  Filled 2016-04-16: qty 2

## 2016-04-16 MED ORDER — DIMENHYDRINATE 50 MG PO TABS
50.0000 mg | ORAL_TABLET | Freq: Three times a day (TID) | ORAL | Status: DC | PRN
  Filled 2016-04-16: qty 1

## 2016-04-16 SURGICAL SUPPLY — 15 items
CABLE SURGICAL S-101-97-12 (CABLE) ×1 IMPLANT
CATH CPS DIRECT 135 DS2C020 (CATHETERS) ×1 IMPLANT
HEMOSTAT SURGICEL 2X4 FIBR (HEMOSTASIS) ×1 IMPLANT
ICD CLARIA MRI DTMA1QQ (ICD Generator) ×1 IMPLANT
LEAD ATTAIN PERFORMA S 4598-88 (Lead) ×1 IMPLANT
LEAD SPRINT QUAT SEC 6935M-62 (Lead) ×1 IMPLANT
PAD DEFIB LIFELINK (PAD) ×1 IMPLANT
SHEATH CLASSIC 7F (SHEATH) IMPLANT
SHEATH CLASSIC 9.5F (SHEATH) ×1 IMPLANT
SHEATH CLASSIC 9F (SHEATH) ×1 IMPLANT
SHIELD RADPAD SCOOP 12X17 (MISCELLANEOUS) ×1 IMPLANT
SLITTER UNIVERSAL DS2A003 (MISCELLANEOUS) ×1 IMPLANT
TRAY PACEMAKER INSERTION (PACKS) ×1 IMPLANT
WIRE ACUITY WHISPER EDS 4648 (WIRE) ×1 IMPLANT
WIRE HI TORQ VERSACORE-J 145CM (WIRE) ×2 IMPLANT

## 2016-04-16 NOTE — Progress Notes (Signed)
We need to work on rate control I will add low dose metoprolol succinate  And dig with careful monitoring  If unable to adequately control rate, will consider AV nodal ablation

## 2016-04-16 NOTE — H&P (Signed)
Patient Care Team: Caryl Bis, MD as PCP - General (Unknown Physician Specialty)   HPI  Victoria Adams is a 76 y.o. female admtted for  ICD/CRT. She has long-standing atrial fibrillation. She has a nonischemic cardiomyopathy. Most recent echocardiogram 11/17 EF 20-25%. She is limited both by her back as well as shortness of breath. This is at 20-30 feet. She does not have orthopnea or nocturnal dyspnea. She does have some peripheral edema; she's had no interval syncope.  Modest renal insufficiency   1/18 Cr 1.52 K 3.9  Past Medical History:  Diagnosis Date  . Allergic rhinitis   . 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 Atrial fibrillation ([place])   . Chronic low back pain    Secondary to DJD  . Chronic systolic heart failure (HCC)    a. echo (5/15):  mod LVH, EF 20-25%, diff HK, severe LAE, mild RAE  . Coronary atherosclerosis-non obstructive    LHC (5/15):  EF 40-45% global HK; long LAD 40-60%, ostial 1st major septal perf 80-90%, ostial CFX ?%, mid AVCFX extensive Ca2+, dist PDA 50%  . Depression   . Essential hypertension, benign   . Fatty liver disease, nonalcoholic   . GERD (gastroesophageal reflux disease)   . Iron deficiency anemia   . Mitral regurgitation   . NICM (nonischemic cardiomyopathy) ([place])    a. echo (5/15):  mod LVH, EF 20-25%, diff HK, severe LAE, mild RAE  . NSTEMI (non-ST elevated myocardial infarction) ([place]) may 2015   No CAD  . Obesity   . Osteopenia   . PONV (postoperative nausea and vomiting)   . Rosacea   . Type 2 diabetes mellitus ([place])   . Vitamin D deficiency     Past Surgical History:  Procedure Laterality Date  . ABDOMINAL HYSTERECTOMY    . APPENDECTOMY    . BREAST BIOPSY     RIGHT  . CARDIAC CATHETERIZATION  2010   LAD 50%, CFX 30%, RCA 40%; EF by echo 25-35%  . CATARACT EXTRACTION Bilateral   . CHOLECYSTECTOMY    . COLONOSCOPY WITH PROPOFOL N/A 12/20/2013   Procedure: COLONOSCOPY WITH PROPOFOL;  Surgeon: Milus Banister, MD;   Location: [facility];  Service: Endoscopy;  Laterality: N/A;  . ESOPHAGOGASTRODUODENOSCOPY N/A 08/25/2013   Procedure: ESOPHAGOGASTRODUODENOSCOPY (EGD);  Surgeon: Milus Banister, MD;  Location: [facility];  Service: Endoscopy;  Laterality: N/A;  . LEFT HEART CATHETERIZATION WITH CORONARY ANGIOGRAM N/A 08/24/2013   Procedure: LEFT HEART CATHETERIZATION WITH CORONARY ANGIOGRAM;  Surgeon: Leonie Man, MD;  Location: [facility] CATH LAB;  Service: Cardiovascular;  Laterality: N/A;  . ROTATOR CUFF REPAIR     RIGHT SHOULDER  . TONSILLECTOMY  yrs ago  . TOTAL ABDOMINAL HYSTERECTOMY W/ BILATERAL SALPINGOOPHORECTOMY     for dermoid tumor    Current Facility-Administered Medications  Medication Dose Route Frequency Provider Last Rate Last Dose  . 0.9 %  sodium chloride infusion   Intravenous Continuous Deboraha Sprang, MD 50 mL/hr at 04/16/16 1035    . ceFAZolin (ANCEF) 3 g in dextrose 5 % 50 mL IVPB  3 g Intravenous To Cath Deboraha Sprang, MD      . chlorhexidine (HIBICLENS) 4 % liquid 4 application  60 mL Topical Once Deboraha Sprang, MD      . gentamicin (GARAMYCIN) 80 mg in sodium chloride irrigation 0.9 % 500 mL irrigation  80 mg Irrigation On Call Deboraha Sprang, MD  Allergies  Allergen Reactions  . Codeine Nausea And Vomiting  . Tylox [Oxycodone-Acetaminophen]     Vision closing in & heart palpitations.     Scheduled Meds: Continuous Infusions: PRN Meds:.   Review of Systems negative except from HPI and PMH  Physical Exam BP (!) 146/87   Pulse 85   Temp 98.7 F (37.1 C) (Oral)   Resp 18   Ht 5\' 6"  (1.676 m)   Wt 270 lb (122.5 kg)   SpO2 97%   BMI 43.58 kg/m  Well developed and well nourished in no acute distress HENT normal E scleral and icterus clear Neck Supple Clear to ausculation irregularly irregular  no murmurs gallops or rub Soft with active bowel sounds No clubbing cyanosis Trace Edema Alert and oriented, grossly normal motor and sensory function Skin  Warm and Dry  ECG  LBBB and Afib HR 78   Assessment and  Plan  LBBB  NICM  CHF chronic systolic  Renal Insufficiency    Pt has  Persistent LV dysfunction with Class 3 CHF.  She has LBBB and despite Afib, is reasonable candidate for CRT and ICD implant  ICD Criteria  Current LVEF:15%. Within 12 months prior to implant: Yes   Heart failure history: Yes, Class III  Cardiomyopathy history: Yes, Non-Ischemic Cardiomyopathy.  Atrial Fibrillation/Atrial Flutter: Yes, Permanent.  Ventricular tachycardia history: No.  Cardiac arrest history: No.  History of syndromes with risk of sudden death: No.  Previous ICD: No.  Current ICD indication: Primary  PPM indication: No.   Class I or II Bradycardia indication present: No  Beta Blocker therapy for 3 or more months: Yes, prescribed.   Ace Inhibitor/ARB therapy for 3 or more months: Yes, prescribed.

## 2016-04-16 NOTE — Progress Notes (Signed)
Paged MD regarding pt waiting for ICD representative to change settings. Pt becoming anxious and uncomfortable. Awaiting call back for estimated time of arrival.   Victoria Adams

## 2016-04-16 NOTE — Discharge Instructions (Signed)
° ° °  Supplemental Discharge Instructions for  Pacemaker/Defibrillator Patients  Activity No heavy lifting or vigorous activity with your left/right arm for 6 to 8 weeks.  Do not raise your left/right arm above your head for one week.  Gradually raise your affected arm as drawn below.             04/20/16                       04/21/16                    04/22/16                   04/23/16 __  NO DRIVING for  1 week   ; you may begin driving on  [id number]   .  WOUND CARE - Keep the wound area clean and dry.  Do not get this area wet, no showers for 24 hours; you may shower on 04/17/16 evening . - No bandage is needed on the site.  DO  NOT apply any creams, oils, or ointments to the wound area. - If you notice any drainage or discharge from the wound, any swelling or bruising at the site, or you develop a fever > 101? F after you are discharged home, call the office at once.  Special Instructions - You are still able to use cellular telephones; use the ear opposite the side where you have your pacemaker/defibrillator.  Avoid carrying your cellular phone near your device. - When traveling through airports, show security personnel your identification card to avoid being screened in the metal detectors.  Ask the security personnel to use the hand wand. - Avoid arc welding equipment, MRI testing (magnetic resonance imaging), TENS units (transcutaneous nerve stimulators).  Call the office for questions about other devices. - Avoid electrical appliances that are in poor condition or are not properly grounded. - Microwave ovens are safe to be near or to operate.  Additional information for defibrillator patients should your device go off: - If your device goes off ONCE and you feel fine afterward, notify the device clinic nurses. - If your device goes off ONCE and you do not feel well afterward, call 911. - If your device goes off TWICE, call 911. - If your device goes off THREE times in one day, call  911.  DO NOT DRIVE YOURSELF OR A FAMILY MEMBER WITH A DEFIBRILLATOR TO THE HOSPITAL--CALL 911.

## 2016-04-16 NOTE — Progress Notes (Signed)
Paged MD regarding pt having lower back pain and vibrations. Pt returned from cath lab- ICD placed. PA [name] returned paged and stated she would come to bedside.  Monalisa Bayless Leory Plowman

## 2016-04-16 NOTE — Interval H&P Note (Signed)
History and Physical Interval Note:  04/16/2016 11:41 AM  Victoria Adams  has presented today for surgery, with the diagnosis of nonischemic cardiomyopathy, chf, LBBB  The various methods of treatment have been discussed with the patient and family. After consideration of risks, benefits and other options for treatment, the patient has consented to  Procedure(s): BiV ICD Insertion CRT-D (N/A) as a surgical intervention .  The patient's history has been reviewed, patient examined, no change in status, stable for surgery.  I have reviewed the patient's chart and labs.  Questions were answered to the patient's satisfaction.        Have reviewed risks and benefits again  Will minimize contrast 2/2 renal issues   Virl Axe

## 2016-04-16 NOTE — Progress Notes (Signed)
Pt placed on CPAP of 8 with 4LPM O2 bled-in.  Pt placed on M/L nasal mask and is tolerating well during implant procedure. NAD noted.

## 2016-04-17 ENCOUNTER — Other Ambulatory Visit: Payer: Self-pay

## 2016-04-17 ENCOUNTER — Ambulatory Visit (HOSPITAL_COMMUNITY): Payer: Medicare Other

## 2016-04-17 ENCOUNTER — Encounter (HOSPITAL_COMMUNITY): Payer: Self-pay | Admitting: General Practice

## 2016-04-17 DIAGNOSIS — I5022 Chronic systolic (congestive) heart failure: Secondary | ICD-10-CM | POA: Diagnosis not present

## 2016-04-17 DIAGNOSIS — I481 Persistent atrial fibrillation: Secondary | ICD-10-CM | POA: Diagnosis not present

## 2016-04-17 DIAGNOSIS — E1122 Type 2 diabetes mellitus with diabetic chronic kidney disease: Secondary | ICD-10-CM | POA: Diagnosis not present

## 2016-04-17 DIAGNOSIS — I428 Other cardiomyopathies: Secondary | ICD-10-CM | POA: Diagnosis not present

## 2016-04-17 DIAGNOSIS — I482 Chronic atrial fibrillation: Secondary | ICD-10-CM | POA: Diagnosis not present

## 2016-04-17 DIAGNOSIS — E559 Vitamin D deficiency, unspecified: Secondary | ICD-10-CM | POA: Diagnosis not present

## 2016-04-17 DIAGNOSIS — G8929 Other chronic pain: Secondary | ICD-10-CM | POA: Diagnosis not present

## 2016-04-17 DIAGNOSIS — Z950 Presence of cardiac pacemaker: Secondary | ICD-10-CM | POA: Diagnosis not present

## 2016-04-17 DIAGNOSIS — I130 Hypertensive heart and chronic kidney disease with heart failure and stage 1 through stage 4 chronic kidney disease, or unspecified chronic kidney disease: Secondary | ICD-10-CM | POA: Diagnosis not present

## 2016-04-17 DIAGNOSIS — I447 Left bundle-branch block, unspecified: Secondary | ICD-10-CM | POA: Diagnosis not present

## 2016-04-17 DIAGNOSIS — N183 Chronic kidney disease, stage 3 (moderate): Secondary | ICD-10-CM | POA: Diagnosis not present

## 2016-04-17 DIAGNOSIS — Z6841 Body Mass Index (BMI) 40.0 and over, adult: Secondary | ICD-10-CM | POA: Diagnosis not present

## 2016-04-17 DIAGNOSIS — F329 Major depressive disorder, single episode, unspecified: Secondary | ICD-10-CM | POA: Diagnosis not present

## 2016-04-17 LAB — GLUCOSE, CAPILLARY: GLUCOSE-CAPILLARY: 142 mg/dL — AB (ref 65–99)

## 2016-04-17 MED ORDER — METOPROLOL SUCCINATE ER 25 MG PO TB24: 25.0000 mg | ORAL_TABLET | Freq: Every day | ORAL | 3 refills | Status: DC

## 2016-04-17 MED ORDER — DIGOXIN 125 MCG PO TABS: 0.1250 mg | ORAL_TABLET | Freq: Every day | ORAL | 3 refills | Status: DC

## 2016-04-17 NOTE — Progress Notes (Signed)
Patient Name: Victoria Adams Date of Encounter: 04/17/2016     Active Problems:   NICM (nonischemic cardiomyopathy) (HCC)   LBBB (left bundle branch block)   Atrial fibrillation (HCC)   Chronic systolic heart failure ([place])   CKD (chronic kidney disease), stage III    SUBJECTIVE  S/p Biv ICD insertion, doing well. Minimal incisional pain.   CURRENT MEDS . carvedilol  25 mg Oral BID WC  . digoxin  0.125 mg Oral Daily  . furosemide  80 mg Oral BID  . insulin glargine  40 Units Subcutaneous QHS  . insulin glargine  80 Units Subcutaneous Daily  . metoprolol succinate  25 mg Oral Daily  . pantoprazole  40 mg Oral Daily  . polyethylene glycol  17 g Oral Daily  . potassium chloride SA  40 mEq Oral Daily  . rosuvastatin  20 mg Oral Daily  . sacubitril-valsartan  1 tablet Oral BID  . spironolactone  12.5 mg Oral Daily  . venlafaxine  75 mg Oral BID    OBJECTIVE  Vitals:   04/16/16 2147 04/17/16 0148 04/17/16 0619 04/17/16 0623  BP: 99/65 (!) 113/44 (!) 117/58   Pulse: 74 74 83   Resp: 20 16 17    Temp: 98 F (36.7 C) 98.2 F (36.8 C)    TempSrc: Oral Oral    SpO2: 92% 93% 92%   Weight:    280 lb 1.6 oz (127.1 kg)  Height:        Intake/Output Summary (Last 24 hours) at 04/17/16 1009 Last data filed at 04/17/16 0612  Gross per 24 hour  Intake             1040 ml  Output              300 ml  Net              740 ml   Filed Weights   04/16/16 0952 04/16/16 1520 04/17/16 0623  Weight: 270 lb (122.5 kg) 278 lb 4.8 oz (126.2 kg) 280 lb 1.6 oz (127.1 kg)    PHYSICAL EXAM  General: Pleasant, NAD. Neuro: Alert and oriented X 3. Moves all extremities spontaneously. Psych: Normal affect. HEENT:  Normal  Neck: Supple without bruits or JVD. Lungs:  Resp regular and unlabored, CTA. No hematoma over device insertion. Heart: RRR no s3, s4, or murmurs. Abdomen: Soft, non-tender, non-distended, BS + x 4.  Extremities: No clubbing, cyanosis or edema. DP/PT/Radials 2+ and  equal bilaterally.  Accessory Clinical Findings  CBC  Recent Labs  04/16/16 1155  WBC 10.1  HGB 13.1  HCT 40.3  MCV 90.8  PLT 99991111   Basic Metabolic Panel  Recent Labs  04/16/16 1023  NA 139  K 4.2  CL 103  CO2 27  GLUCOSE 121*  BUN 20  CREATININE 1.52*  CALCIUM 9.2   Liver Function Tests No results for input(s): AST, ALT, ALKPHOS, BILITOT, PROT, ALBUMIN in the last 72 hours. No results for input(s): LIPASE, AMYLASE in the last 72 hours. Cardiac Enzymes No results for input(s): CKTOTAL, CKMB, CKMBINDEX, TROPONINI in the last 72 hours. BNP Invalid input(s): POCBNP D-Dimer No results for input(s): DDIMER in the last 72 hours. Hemoglobin A1C No results for input(s): HGBA1C in the last 72 hours. Fasting Lipid Panel No results for input(s): CHOL, HDL, LDLCALC, TRIG, CHOLHDL, LDLDIRECT in the last 72 hours. Thyroid Function Tests No results for input(s): TSH, T4TOTAL, T3FREE, THYROIDAB in the last 72 hours.  Invalid  input(s): FREET3  TELE  Atrial fib with biv pacing  Radiology/Studies  Dg Chest 2 View  Result Date: 04/17/2016 CLINICAL DATA:  Pacemaker insertion EXAM: CHEST  2 VIEW COMPARISON:  08/29/2013 FINDINGS: Left subclavian pacemaker has been inserted. No pneumothorax or effusion. Stable cardiomegaly without edema, pneumonia, collapse or consolidation. Trachea is midline. Degenerative changes noted of the spine. IMPRESSION: Status post left subclavian pacemaker. No pneumothorax or effusion Stable cardiomegaly without acute process Electronically Signed   By: Jerilynn Mages.  Shick M.D.   On: 04/17/2016 08:26    ASSESSMENT AND PLAN  1. Chronic systolic heart failure - she is s/p BiV ICD implant. Device interogation under my direction demonstrates normal device function. 2. Atrial fib - her ventricular rate is under reasonable control. 3. ICD - her device is working normally. Usual followup on DC home.  Victoria Adams,M.D.  04/17/2016 10:09 AMPatient ID: Victoria Adams,  female   DOB: [birth year], 76 y.o.   MRN: [id number]

## 2016-04-17 NOTE — Discharge Summary (Signed)
Discharge Summary    Patient ID: Victoria Adams,  MRN: [id number], DOB/AGE: 10/09/1940 76 y.o.  Admit date: 04/16/2016 Discharge date: 04/17/2016  Primary Care Provider: Gar Ponto Primary Cardiologist: Dr. McDowell/ Dr. Haroldine Laws (CHF)/ Dr. Caryl Comes (EP)   Discharge Diagnoses    Principal Problem:   NICM (nonischemic cardiomyopathy) [facility]) Active Problems:   Morbid obesity ([place])   Essential hypertension, benign   LBBB (left bundle branch block)   Atrial fibrillation (HCC)   Chronic systolic heart failure (HCC)   CKD (chronic kidney disease), stage III   Allergies Allergies  Allergen Reactions   Codeine Nausea And Vomiting   Tylox [Oxycodone-Acetaminophen]     Vision closing in & heart palpitations.      History of Present Illness     Victoria Adams is a 76 y.o. female with a history of NICM (EF 20-25%), LBBB, chronic atrial fib, HTN, CAD and CKD who presented to [facility] on 04/16/16 for planned ICD implantation.    Hospital Course     Consultants: none.   Chronic systolic heart failure/NICM with LBBB: she is s/p BiV ICD implant with a Medtronic MRI compatible ICD on 04/16/16. Device interogation demonstrates normal device function. CXR with no PTX. Wound site look good. She will be discharged on BB and ARB (Entresto) as well as home diuretic regimen including spironolactone.   Atrial fib with RVR:  her ventricular rate is under reasonable control now after Dr. Caryl Comes added Toprol XL 25mg  and digoxin 0.125mg  daily for better rate control in addition to home Coreg 25mg  BID. Per Dr. Caryl Comes, if we cannot get rate under control, she may need an AV nodal ablation. Continue Eliquis for CHADSVASC score 4 (CHF, HTN, age, F sex).     The patient has had an uncomplicated hospital course and is recovering well. She has been seen by Dr. Lovena Le today and deemed ready for discharge home. All follow-up appointments have been scheduled. Discharge medications are listed  below.  _____________  Discharge Vitals Blood pressure (!) 117/58, pulse 83, temperature 98.2 F (36.8 C), temperature source Oral, resp. rate 17, height 5\' 6"  (1.676 m), weight 280 lb 1.6 oz (127.1 kg), SpO2 92 %.  Filed Weights   04/16/16 0952 04/16/16 1520 04/17/16 0623  Weight: 270 lb (122.5 kg) 278 lb 4.8 oz (126.2 kg) 280 lb 1.6 oz (127.1 kg)    Labs & Radiologic Studies     CBC  Recent Labs  04/16/16 1155  WBC 10.1  HGB 13.1  HCT 40.3  MCV 90.8  PLT 99991111   Basic Metabolic Panel  Recent Labs  04/16/16 1023  NA 139  K 4.2  CL 103  CO2 27  GLUCOSE 121*  BUN 20  CREATININE 1.52*  CALCIUM 9.2   Liver Function Tests No results for input(s): AST, ALT, ALKPHOS, BILITOT, PROT, ALBUMIN in the last 72 hours. No results for input(s): LIPASE, AMYLASE in the last 72 hours. Cardiac Enzymes No results for input(s): CKTOTAL, CKMB, CKMBINDEX, TROPONINI in the last 72 hours. BNP Invalid input(s): POCBNP D-Dimer No results for input(s): DDIMER in the last 72 hours. Hemoglobin A1C No results for input(s): HGBA1C in the last 72 hours. Fasting Lipid Panel No results for input(s): CHOL, HDL, LDLCALC, TRIG, CHOLHDL, LDLDIRECT in the last 72 hours. Thyroid Function Tests No results for input(s): TSH, T4TOTAL, T3FREE, THYROIDAB in the last 72 hours.  Invalid input(s): FREET3  Dg Chest 2 View  Result Date: 04/17/2016 CLINICAL DATA:  Pacemaker insertion  EXAM: CHEST  2 VIEW COMPARISON:  08/29/2013 FINDINGS: Left subclavian pacemaker has been inserted. No pneumothorax or effusion. Stable cardiomegaly without edema, pneumonia, collapse or consolidation. Trachea is midline. Degenerative changes noted of the spine. IMPRESSION: Status post left subclavian pacemaker. No pneumothorax or effusion Stable cardiomegaly without acute process Electronically Signed   By: Jerilynn Mages.  Shick M.D.   On: 04/17/2016 08:26     Diagnostic Studies/Procedures    04/16/16 EP PPM/ICD IMPLANT  Lead  Lead  Sprint Quat Sec 6973m-62 - [id number] v - Implanted    Inventory item: Lead Sprint Quat Sec 6983m-62 Model/Cat number: [id number]  Serial number: [id number] Manufacturer: [employer]  Device identifier: [id number] Device identifier type: GS1  GUDID Information   Request status Successful    Brand name: Sprint Quattro Secure S MRI SureScan Version/Model: [place] name: [employer] MRI safety info as of 04/16/16: MR Conditional  Contains dry or latex rubber: No    GMDN P.T. name: Endocardial defibrillation lead    As of 04/16/2016   Status: Implanted      Lead Geryl Rankins [id number] - [id number] v - Implanted    Inventory item: Lead Geryl Rankins [id number] Model/Cat number: [id number]  Serial number: [id number] Manufacturer: [place]  Device identifier: [id number] Device identifier type: GS1  GUDID Information   Request status Successful    Brand name: [place] Version/Model: [place] name: [employer] MRI safety info as of 04/16/16: MR Conditional  Contains dry or latex rubber: No    GMDN P.T. name: Endocardial pacing lead    As of 04/16/2016   Status: Implanted      ICD Generator  Icd Hazle Nordmann [id number] - [id number] - Implanted      _____________    Disposition   Pt is being discharged home today in good condition.  Follow-up Plans & Appointments    Follow-up Information    [facility] Follow up on 04/26/2016.   Specialty:  Cardiology Why:  9:00AM, wound check Contact information: [address] [place] Follow up on 04/28/2016.   Specialty:  Cardiology Why:  3:00PM Contact information: [address] [id number] [place] [phone]           Discharge Medications     Medication List    TAKE  these medications   CALCIUM 600 + D PO Take 1 tablet by mouth 2 (two) times daily.   carvedilol 25 MG tablet Commonly known as:  COREG Take 1 tablet (25 mg total) by mouth 2 (two) times daily with a meal.   digoxin 0.125 MG tablet Commonly known as:  LANOXIN Take 1 tablet (0.125 mg total) by mouth daily.   dimenhyDRINATE 50 MG tablet Commonly known as:  DRAMAMINE Take 50 mg by mouth every 8 (eight) hours as needed for nausea.   diphenhydrAMINE 25 MG tablet Commonly known as:  SOMINEX Take 25 mg by mouth at bedtime as needed for itching or sleep.   doxycycline 100 MG capsule Commonly known as:  VIBRAMYCIN Take 100-200 mg by mouth 2 (two) times daily as needed (rosacea).   ELIQUIS 5 MG Tabs tablet Generic drug:  apixaban TAKE (1) TABLET TWICE DAILY.   ferrous sulfate 325 (65 FE) MG tablet TAKE 1 TABLET ONCE DAILY WITH BREAKFAST.   furosemide 80 MG tablet Commonly known  as:  LASIX Take 80 mg by mouth 2 (two) times daily.   insulin glargine 100 UNIT/ML injection Commonly known as:  LANTUS Inject 40-80 Units into the skin 2 (two) times daily. 80 units in the morning and 40 units in the evening   metolazone 2.5 MG tablet Commonly known as:  ZAROXOLYN Take 2.5 mg (1 tablet) once daily for two days.   metoprolol succinate 25 MG 24 hr tablet Commonly known as:  TOPROL-XL Take 1 tablet (25 mg total) by mouth daily. Start taking on:  04/18/2016   metroNIDAZOLE 0.75 % gel Commonly known as:  METROGEL Apply 1 application topically 2 (two) times daily as needed (rosacea).   mometasone 0.1 % lotion Commonly known as:  ELOCON Apply 1 application topically daily as needed (eczema). EAR ECZEMA   omeprazole 20 MG capsule Commonly known as:  PRILOSEC Take 20 mg by mouth 2 (two) times daily.   oxyCODONE-acetaminophen 10-325 MG tablet Commonly known as:  PERCOCET Take 1 tablet by mouth every 6 (six) hours as needed for pain.   polyethylene glycol packet Commonly known as:   MIRALAX / GLYCOLAX Take 17 g by mouth daily.   potassium chloride SA 20 MEQ tablet Commonly known as:  K-DUR,KLOR-CON Take 2 tablets (40 mEq total) by mouth daily. Take 40 MEQ (2 tablets) by mouth with Metolazone for TWO DAYS   rosuvastatin 20 MG tablet Commonly known as:  CRESTOR TAKE 1 TABLET AT 6:00PM.   sacubitril-valsartan 24-26 MG Commonly known as:  ENTRESTO TAKE (1) TABLET TWICE DAILY.   spironolactone 25 MG tablet Commonly known as:  ALDACTONE Take 0.5 tablets (12.5 mg total) by mouth daily.   venlafaxine 75 MG tablet Commonly known as:  EFFEXOR Take 75 mg by mouth 2 (two) times daily.         Outstanding Labs/Studies   None  Duration of Discharge Encounter   Greater than 30 minutes including physician time.  Signed, Angelena Form PA-C 04/17/2016, 11:31 AM  Cardiology Attending  Patient seen and examined. Agree with above. She is stable for DC.  Mikle Bosworth.D.

## 2016-04-17 NOTE — Progress Notes (Signed)
Pt discharge education went over at bedside with patient and family memebers. Pt IV discontinued, catheter intact and telemetry removed. Pt has all belongings including pacemaker booklet and box, and discharge paperwork. Pt discharged via wheelchair with family and volunteer.   Victoria Adams Leory Plowman

## 2016-04-19 ENCOUNTER — Encounter (HOSPITAL_COMMUNITY): Payer: Self-pay | Admitting: Internal Medicine

## 2016-04-19 NOTE — Op Note (Signed)
NAME:  Victoria Adams, Victoria Adams NO.:  MEDICAL RECORD NO.:  [id number]  LOCATION:                                 FACILITY:  PHYSICIAN:  Deboraha Sprang, MD, FACCDATE OF BIRTH:  April 08, 1941  DATE OF PROCEDURE:  04/16/2016 DATE OF DISCHARGE:                              OPERATIVE REPORT   PREOPERATIVE DIAGNOSES:  Atrial fibrillation-permanent, nonischemic cardiomyopathy, congestive heart failure, and left bundle branch block.  POSTOPERATIVE DIAGNOSES:  Atrial fibrillation-permanent, nonischemic cardiomyopathy, congestive heart failure, and left bundle branch block.  PROCEDURES:  Single-chamber defibrillator implantation with left ventricular lead placement and high-voltage lead assessment.  DESCRIPTION OF PROCEDURE:  Following obtaining informed consent, the patient was brought to electrophysiology laboratory and placed on the fluoroscopic table in supine position.  After routine prep and drape of the left upper chest, lidocaine was infiltrated in prepectoral subclavicular region.  An incision was made and carried down to layer of the prepectoral fascia using electrocautery and sharp dissection.  A pocket was formed similarly.  Hemostasis was obtained.  Thereafter, attention was turned to gain access to the extrathoracic left subclavian vein which was accomplished with modest difficulty.  The artery was cannulated on 3 separate occasions.  Pressure was held on each occasion for 1-2 minutes.  Finally two separate venipunctures were accomplished.  Guidewires were placed and retained and sequentially a 9 and 9.5-French sheaths were placed which were passed a Medtronic 6935, 62 cm, active fixation ventricular lead, serial number [id number], and a St. Jude 135 coronary sinus cannulation catheter.  Following insertion the ventricular lead was deployed to the right ventricular apex where bipolar R-wave was 9 V with pace impedance of 840, threshold 0.5 V at 0.5  milliseconds, current threshold was 0.7 mA. There was no diaphragmatic pacing at 10 V and the current of injury was brisk.  This lead was secured to the prepectoral fascia.  We then obtained access to the coronary sinus with mild difficulty.  We were actually rather easily to cannulate the mid cardiac vein, but this allowed [place] for cannulation of the body of the coronary sinus.  Two veins were identified on the mid lateral wall, one with an anterior course and one with a more posterior course.  A Shepherd's crook of the latter made it significantly more difficult and so we mapped the former and found a Q-LV interval of 150 milliseconds.  That being the case, we deployed and we left the mapping lead in place, a Medtronic 4598 lead serial number [id number].  In this location the bipolar L-wave was 13 with a pace impedance of 1200 ohms with threshold of less than 2 V at 0.5 milliseconds.  This lead was then secured to the prepectoral fascia following removal of the deployment tools.  Fluoroscopic stability was noted.  The leads were then attached to Medtronic Claria MRI compatible device, serial number [id number].  Through the device, the bipolar R-wave was 14.9 with a pace impedance of 646, a threshold 0.75 V at 0.4 milliseconds, and the LV impedance was 950 with threshold 1.75 at 0.8 milliseconds.  High-voltage impedance was 85 ohms.  With these acceptable parameters recorded, both the  pocket was copiously irrigated with antibiotic containing saline solution.  The leads in the pulse generator were placed in the pocket, secured to the prepectoral fascia. Surgicel was placed in a room around the device and the wound was then closed in 2 layers in normal fashion.  The wound was washed, dried, and a benzoin and Steri-Strip dressing was applied.  Needle counts, sponge counts, and instrument counts were correct at the end of the procedure.     Deboraha Sprang, MD, [facility]     SCK/MEDQ   D:  04/16/2016  T:  04/17/2016  Job:  [id number]

## 2016-04-26 ENCOUNTER — Ambulatory Visit (INDEPENDENT_AMBULATORY_CARE_PROVIDER_SITE_OTHER): Payer: Medicare Other | Admitting: *Deleted

## 2016-04-26 DIAGNOSIS — I428 Other cardiomyopathies: Secondary | ICD-10-CM

## 2016-04-26 NOTE — Progress Notes (Signed)
Wound check appointment. Dermabond removed. Wound without redness or edema. Visible stitch noted on left lateral incision, was able to clip stitch partially. Normal device function. Thresholds, sensing, and impedances consistent with implant measurements. Device programmed at 3.5V for extra safety margin until 3 month visit. Histogram distribution appropriate for patient and level of activity. No ventricular arrhythmias noted. Patient educated about wound care, arm mobility, lifting restrictions, shock plan. ROV w/ device clinic 05/06/2016 for wound re-check. ROV w/ SK 07/2016

## 2016-04-28 ENCOUNTER — Encounter (HOSPITAL_COMMUNITY): Payer: Medicare Other

## 2016-05-06 ENCOUNTER — Ambulatory Visit (INDEPENDENT_AMBULATORY_CARE_PROVIDER_SITE_OTHER): Payer: Medicare Other | Admitting: *Deleted

## 2016-05-06 ENCOUNTER — Ambulatory Visit (HOSPITAL_COMMUNITY)
Admission: RE | Admit: 2016-05-06 | Discharge: 2016-05-06 | Disposition: A | Discharge status: Home / Self Care | Payer: Medicare Other | Source: Ambulatory Visit | Attending: Cardiology | Admitting: Cardiology

## 2016-05-06 VITALS — BP 134/62 | HR 64 | Wt 281.0 lb

## 2016-05-06 DIAGNOSIS — I130 Hypertensive heart and chronic kidney disease with heart failure and stage 1 through stage 4 chronic kidney disease, or unspecified chronic kidney disease: Secondary | ICD-10-CM | POA: Insufficient documentation

## 2016-05-06 DIAGNOSIS — Z95810 Presence of automatic (implantable) cardiac defibrillator: Secondary | ICD-10-CM

## 2016-05-06 DIAGNOSIS — I447 Left bundle-branch block, unspecified: Secondary | ICD-10-CM | POA: Diagnosis not present

## 2016-05-06 DIAGNOSIS — E1122 Type 2 diabetes mellitus with diabetic chronic kidney disease: Secondary | ICD-10-CM | POA: Diagnosis not present

## 2016-05-06 DIAGNOSIS — I481 Persistent atrial fibrillation: Secondary | ICD-10-CM

## 2016-05-06 DIAGNOSIS — Z794 Long term (current) use of insulin: Secondary | ICD-10-CM | POA: Insufficient documentation

## 2016-05-06 DIAGNOSIS — E669 Obesity, unspecified: Secondary | ICD-10-CM | POA: Insufficient documentation

## 2016-05-06 DIAGNOSIS — K760 Fatty (change of) liver, not elsewhere classified: Secondary | ICD-10-CM | POA: Diagnosis not present

## 2016-05-06 DIAGNOSIS — I2510 Atherosclerotic heart disease of native coronary artery without angina pectoris: Secondary | ICD-10-CM | POA: Diagnosis not present

## 2016-05-06 DIAGNOSIS — N183 Chronic kidney disease, stage 3 unspecified: Secondary | ICD-10-CM

## 2016-05-06 DIAGNOSIS — F329 Major depressive disorder, single episode, unspecified: Secondary | ICD-10-CM | POA: Diagnosis not present

## 2016-05-06 DIAGNOSIS — I252 Old myocardial infarction: Secondary | ICD-10-CM | POA: Diagnosis not present

## 2016-05-06 DIAGNOSIS — D649 Anemia, unspecified: Secondary | ICD-10-CM | POA: Insufficient documentation

## 2016-05-06 DIAGNOSIS — Z7901 Long term (current) use of anticoagulants: Secondary | ICD-10-CM | POA: Diagnosis not present

## 2016-05-06 DIAGNOSIS — Z950 Presence of cardiac pacemaker: Secondary | ICD-10-CM | POA: Diagnosis not present

## 2016-05-06 DIAGNOSIS — L719 Rosacea, unspecified: Secondary | ICD-10-CM | POA: Insufficient documentation

## 2016-05-06 DIAGNOSIS — K219 Gastro-esophageal reflux disease without esophagitis: Secondary | ICD-10-CM | POA: Diagnosis not present

## 2016-05-06 DIAGNOSIS — G8929 Other chronic pain: Secondary | ICD-10-CM | POA: Insufficient documentation

## 2016-05-06 DIAGNOSIS — W19XXXA Unspecified fall, initial encounter: Secondary | ICD-10-CM | POA: Diagnosis not present

## 2016-05-06 DIAGNOSIS — M8580 Other specified disorders of bone density and structure, unspecified site: Secondary | ICD-10-CM | POA: Diagnosis not present

## 2016-05-06 DIAGNOSIS — I10 Essential (primary) hypertension: Secondary | ICD-10-CM

## 2016-05-06 DIAGNOSIS — Z801 Family history of malignant neoplasm of trachea, bronchus and lung: Secondary | ICD-10-CM | POA: Insufficient documentation

## 2016-05-06 DIAGNOSIS — Z8249 Family history of ischemic heart disease and other diseases of the circulatory system: Secondary | ICD-10-CM | POA: Insufficient documentation

## 2016-05-06 DIAGNOSIS — M549 Dorsalgia, unspecified: Secondary | ICD-10-CM | POA: Diagnosis not present

## 2016-05-06 DIAGNOSIS — I5022 Chronic systolic (congestive) heart failure: Secondary | ICD-10-CM | POA: Insufficient documentation

## 2016-05-06 DIAGNOSIS — E559 Vitamin D deficiency, unspecified: Secondary | ICD-10-CM | POA: Insufficient documentation

## 2016-05-06 DIAGNOSIS — I340 Nonrheumatic mitral (valve) insufficiency: Secondary | ICD-10-CM | POA: Diagnosis not present

## 2016-05-06 DIAGNOSIS — I482 Chronic atrial fibrillation: Secondary | ICD-10-CM | POA: Diagnosis not present

## 2016-05-06 DIAGNOSIS — I429 Cardiomyopathy, unspecified: Secondary | ICD-10-CM | POA: Diagnosis not present

## 2016-05-06 DIAGNOSIS — I4819 Other persistent atrial fibrillation: Secondary | ICD-10-CM

## 2016-05-06 LAB — BASIC METABOLIC PANEL
Anion gap: 10 (ref 5–15)
BUN: 22 mg/dL — ABNORMAL HIGH (ref 6–20)
CO2: 28 mmol/L (ref 22–32)
Calcium: 9.1 mg/dL (ref 8.9–10.3)
Chloride: 93 mmol/L — ABNORMAL LOW (ref 101–111)
Creatinine, Ser: 1.89 mg/dL — ABNORMAL HIGH (ref 0.44–1.00)
GFR calc Af Amer: 29 mL/min — ABNORMAL LOW (ref >60)
GFR calc non Af Amer: 25 mL/min — ABNORMAL LOW (ref >60)
Glucose, Bld: 434 mg/dL — ABNORMAL HIGH (ref 65–99)
Potassium: 4.3 mmol/L (ref 3.5–5.1)
Sodium: 131 mmol/L — ABNORMAL LOW (ref 135–145)

## 2016-05-06 LAB — BRAIN NATRIURETIC PEPTIDE: B Natriuretic Peptide: 157.3 pg/mL — ABNORMAL HIGH (ref 0.0–100.0)

## 2016-05-06 MED ORDER — SPIRONOLACTONE 25 MG PO TABS: 25.0000 mg | ORAL_TABLET | Freq: Every day | ORAL | 6 refills | Status: DC

## 2016-05-06 NOTE — Patient Instructions (Signed)
INCREASE spironolactone to 25 mg (1 whole tablet) once daily. Rx refilled to pharmacy electronically.  Routine lab work today. Will notify you of abnormal results, otherwise no news is good news!  Will have lab repeated at your apt at [facility] office on 05/14/2016.  Follow up 1 month with Dr. Haroldine Laws.  Do the following things EVERYDAY: 1) Weigh yourself in the morning before breakfast. Write it down and keep it in a log. 2) Take your medicines as prescribed 3) Eat low salt foods-Limit salt (sodium) to 2000 mg per day.  4) Stay as active as you can everyday 5) Limit all fluids for the day to less than 2 liters

## 2016-05-06 NOTE — Progress Notes (Signed)
Patient ID: [name], female   DOB: [birth year], 76 y.o.   MRN: [id number]     Advanced Heart Failure Clinic Note   PCP: Dr. Gar Ponto ([facility] in [place]) Primary Cardiologist: Dr. Domenic Polite EP: Dr Caryl Comes  HF: Dr. Haroldine Laws   HPI: Ms. Mcfarlain is a 76 yo female with a history of chronic anemia, HTN, chronic A-Fib, nonischemic cardiomyopathy, DM2, CAD, chronic back pain and chronic systolic HF.   Admitted to the hospital 5/13-5/27/15 for A/C HF and NSTEMI. Troponin peaked at 6.7 and was taken for cath 08/24/13 showing moderate diffuse mid LAD and septal perforator branck with ostial ~90% occluded. Treated medically. History of chronic anemia and Hgb continued to drop and GI consulted and they did EGD showing no evidence of bleeding or pathology. Cr started rising and PICC was placed for CVP monitoring and co-oxs. She was placed on milrinone and once weaned off co-ox remained marginal in the mid 52s. Discharge weight 236 lbs.   She returns today for regular follow up.  S/p CRT-D placement.  Now able to take a shower with getting "tired out". Breathing has been better as well. Has noticed increased leg edema since procedure. Weight at home 270 -> 274. Taking all medications as directed. HR has been in 90s at home.  Taking coreg and Toprol per Dr. Caryl Comes.  Doesn't drive. Lives with her daughter. Denies lightheadedness or dizziness.   S/p Medtronic CRT-D placement 04/16/16  ECHO (08/2013) EF 20-25%, RV normal            (02/2014) EF 25-30%, RV normal           (09/2014) EF 20-25% RV mildly dilated.            (02/2016) EF 20-25% RV normal    Labs: (09/11/13): Creatinine 1.3, K 4.2, BUN 29, dig level 0.4 (09/20/13) Creatinine 1.36 K 4.9  (10/03/13) K 5.8, creatinine 1.67, BUN 51 (10/11/13) K 5.0, creatinine 1.21, BUN 26 (11/26/13) K 3.7 Creatinine 1.0  01/10/14 K 4.2 Cr 1.07  (02/27/14) K 4.0, creatinine 1.01 (12/15) K 4.1, creatinine 1.14 (04/16/2014) K 4.0 Creatinine 1.29 Hgb 13.8  (09/13/2014): K 4.0  Creatinine 1.53  (12/26/2014) K 3.1 Creatinuine 2.46   SH: Lives in [place] with son and daughter, no ETOH and does not smoke, retired  FH: Mother deceased: CAD?, HTN, lung cancer, afib        Father deceased: DM2, lung cancer, HTN, CAD stents        3 daughters (1 has afib)        1 son: healthy and alive  ROS: All systems negative except as listed in HPI, PMH and Problem List.  Past Medical History:  Diagnosis Date  . Allergic rhinitis   . 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 Atrial fibrillation ([place])   . Chronic low back pain    Secondary to DJD  . Chronic systolic heart failure (HCC)    a. echo (5/15):  mod LVH, EF 20-25%, diff HK, severe LAE, mild RAE  . Coronary atherosclerosis-non obstructive    LHC (5/15):  EF 40-45% global HK; long LAD 40-60%, ostial 1st major septal perf 80-90%, ostial CFX ?%, mid AVCFX extensive Ca2+, dist PDA 50%  . Depression   . Essential hypertension, benign   . Fatty liver disease, nonalcoholic   . GERD (gastroesophageal reflux disease)   . Iron deficiency anemia   . Mitral regurgitation   . NICM (nonischemic cardiomyopathy) ([place])  a. echo (5/15):  mod LVH, EF 20-25%, diff HK, severe LAE, mild RAE  . NSTEMI (non-ST elevated myocardial infarction) ([place]) may 2015   No CAD  . Obesity   . Osteopenia   . PONV (postoperative nausea and vomiting)   . Rosacea   . Type 2 diabetes mellitus ([place])   . Vitamin D deficiency     Current Outpatient Prescriptions  Medication Sig Dispense Refill  . Calcium Carb-Cholecalciferol (CALCIUM 600 + D PO) Take 1 tablet by mouth 2 (two) times daily.     . carvedilol (COREG) 25 MG tablet Take 1 tablet (25 mg total) by mouth 2 (two) times daily with a meal. 60 tablet 6  . digoxin (LANOXIN) 0.125 MG tablet Take 1 tablet (0.125 mg total) by mouth daily. 30 tablet 3  . dimenhyDRINATE (DRAMAMINE) 50 MG tablet Take 50 mg by mouth every 8 (eight) hours as needed for nausea.    . diphenhydrAMINE (SOMINEX) 25 MG tablet Take 25  mg by mouth at bedtime as needed for itching or sleep.    Marland Kitchen doxycycline (VIBRAMYCIN) 100 MG capsule Take 100-200 mg by mouth 2 (two) times daily as needed (rosacea).     Marland Kitchen ELIQUIS 5 MG TABS tablet TAKE (1) TABLET TWICE DAILY. 60 tablet 3  . ferrous sulfate 325 (65 FE) MG tablet TAKE 1 TABLET ONCE DAILY WITH BREAKFAST. 30 tablet 0  . furosemide (LASIX) 80 MG tablet Take 80 mg by mouth 2 (two) times daily.    . insulin glargine (LANTUS) 100 UNIT/ML injection Inject 40-80 Units into the skin 2 (two) times daily. 80 units in the morning and 40 units in the evening    . metolazone (ZAROXOLYN) 2.5 MG tablet Take 2.5 mg (1 tablet) once daily for two days. 5 tablet 0  . metoprolol succinate (TOPROL-XL) 25 MG 24 hr tablet Take 1 tablet (25 mg total) by mouth daily. 30 tablet 3  . metroNIDAZOLE (METROGEL) 0.75 % gel Apply 1 application topically 2 (two) times daily as needed (rosacea).    . mometasone (ELOCON) 0.1 % lotion Apply 1 application topically daily as needed (eczema). EAR ECZEMA    . oxyCODONE-acetaminophen (PERCOCET) 10-325 MG per tablet Take 1 tablet by mouth every 6 (six) hours as needed for pain.     . polyethylene glycol (MIRALAX / GLYCOLAX) packet Take 17 g by mouth daily.    . rosuvastatin (CRESTOR) 20 MG tablet TAKE 1 TABLET AT 6:00PM. 30 tablet 6  . sacubitril-valsartan (ENTRESTO) 24-26 MG TAKE (1) TABLET TWICE DAILY. 60 tablet 3  . spironolactone (ALDACTONE) 25 MG tablet Take 12.5 mg by mouth daily.    Marland Kitchen venlafaxine (EFFEXOR) 75 MG tablet Take 75 mg by mouth 2 (two) times daily.      No current facility-administered medications for this encounter.      Vitals:   05/06/16 1026  BP: 134/62  Pulse: 64  SpO2: (!) 89%  Weight: 281 lb (127.5 kg)   Wt Readings from Last 3 Encounters:  05/06/16 281 lb (127.5 kg)  04/17/16 280 lb 1.6 oz (127.1 kg)  03/02/16 280 lb 3.2 oz (127.1 kg)     PHYSICAL EXAM: General:  Elderly appearing. NAD. Arrived in Wheel chair. Daughter present.      HEENT: Normal Neck: supple. JVP does not appear elevated on exam. Carotids 2+ bilaterally; no bruits. No thyromegaly or nodule noted.  Cor: PMI normal.  Regular rhythm. No M/G/R.  Lungs: CTAB, normal effort.  Abdomen: markedly obese, soft, NT,  ND, no HSM. No bruits or masses. +BS  Extremities: no cyanosis, clubbing, rash,  Trace ankle edema.   Neuro: alert & orientedx3, cranial nerves grossly intact. Moves all 4 extremities w/o difficulty. Affect pleasant.   ASSESSMENT & PLAN: 1) Chronic systolic HF: NICM, EF [id number], RV nl (02/2014).  ECHO 09/2014 EF 20-25%. ECHO 01/2016 EF 20-25%. Refer to EP for CRT-D   NYHA II-IIIb symptom.  - Volume status looks stable on exam.  Continue lasix 80 mg BID.  Told her she can take extra 20-40 mg as needed for weight gain or edema.   - Continue coreg 25 mg twice a day and Toprol XL 25 mg daily. Discussed with EP.  Goal is to depress intrinsic rate to encourage CRT-pacing.  Will have EP address further at visit next week.  Recommended she stay on for now during our phone call.  - Continue Entresto 24/26 mg BID.  - Increase spironolactone 25 mg daily.    - BMEt/BNP today. Recheck next week with increase of spiro.  - Reinforced fluid restriction to < 2 L daily, sodium restriction to less than 2000 mg daily, and the importance of daily weights.    2) CAD: Moderate diffuse mid LAD disease of 40-60%. Septal perforator branch with ostial ~90%.  - No chest pain.  - Continue statin and BB.  3) CKD stage III: BMET/BNP today.     4) Afib: Chronic with rate control. - On dual beta blockers as above. OK to continue for now per EP.  - Continue eliquis 5 mg BID.   5) Falls - No recent falls.  6) LBBB- EKG 01/2016 QRS 160 ms  - s/p CRT-D now.  7) HTN - Increasing spiro as above.   Meds and labs as above. Repeat BMET at follow up with EP next week with addition of spiro. Follow up with MD 1 month.   Shirley Friar, PA-C  05/06/2016   Total time spent > 25  minutes.

## 2016-05-06 NOTE — Progress Notes (Signed)
Advanced Heart Failure Medication Review by a Pharmacist  Does the patient  feel that his/her medications are working for him/her?  yes  Has the patient been experiencing any side effects to the medications prescribed?  no  Does the patient measure his/her own blood pressure or blood glucose at home?  no   Does the patient have any problems obtaining medications due to transportation or finances?   no  Understanding of regimen: good Understanding of indications: good Potential of compliance: good Patient understands to avoid NSAIDs. Patient understands to avoid decongestants.  Issues to address at subsequent visits: None   Pharmacist comments: Victoria Adams is a pleasant 76 yo F presenting with her daughter and without a medication list. She reports good compliance with her regimen and did not have any specific medication-related questions or concerns for me at this time.     Time with patient: 10 minutes Preparation and documentation time: 2 minutes Total time: 12 minutes

## 2016-05-06 NOTE — Progress Notes (Signed)
Wound re-check, stitch clipped from left lateral incision. Area from clipped incision superficially un-approximated, Steri-Strips applied and ROV with device clinic 05/14/2016 for wound re-check.

## 2016-05-14 ENCOUNTER — Ambulatory Visit: Payer: Medicare Other | Admitting: *Deleted

## 2016-05-14 DIAGNOSIS — I10 Essential (primary) hypertension: Secondary | ICD-10-CM | POA: Diagnosis not present

## 2016-05-15 LAB — BASIC METABOLIC PANEL
BUN / CREAT RATIO: 14 (ref 12–28)
BUN: 24 mg/dL (ref 8–27)
CHLORIDE: 94 mmol/L — AB (ref 96–106)
CO2: 28 mmol/L (ref 18–29)
Calcium: 8.9 mg/dL (ref 8.7–10.3)
Creatinine, Ser: 1.7 mg/dL — ABNORMAL HIGH (ref 0.57–1.00)
GFR calc Af Amer: 34 mL/min/{1.73_m2} — ABNORMAL LOW (ref >59)
GFR calc non Af Amer: 29 mL/min/{1.73_m2} — ABNORMAL LOW (ref >59)
GLUCOSE: 330 mg/dL — AB (ref 65–99)
Potassium: 4.4 mmol/L (ref 3.5–5.2)
SODIUM: 136 mmol/L (ref 134–144)

## 2016-05-17 ENCOUNTER — Other Ambulatory Visit: Payer: Medicare Other | Admitting: *Deleted

## 2016-06-07 ENCOUNTER — Encounter (HOSPITAL_COMMUNITY): Payer: Medicare Other | Admitting: Internal Medicine

## 2016-06-07 ENCOUNTER — Other Ambulatory Visit: Payer: Self-pay | Admitting: Cardiology

## 2016-06-12 ENCOUNTER — Encounter (HOSPITAL_COMMUNITY): Payer: Self-pay | Admitting: Emergency Medicine

## 2016-06-12 ENCOUNTER — Emergency Department (HOSPITAL_COMMUNITY): Payer: Medicare Other

## 2016-06-12 ENCOUNTER — Inpatient Hospital Stay (HOSPITAL_COMMUNITY)
Admission: EM | Admit: 2016-06-12 | Discharge: 2016-06-16 | DRG: 683 | Disposition: A | Discharge status: Home / Self Care | Payer: Medicare Other | Attending: Internal Medicine | Admitting: Internal Medicine

## 2016-06-12 DIAGNOSIS — Z8249 Family history of ischemic heart disease and other diseases of the circulatory system: Secondary | ICD-10-CM

## 2016-06-12 DIAGNOSIS — E871 Hypo-osmolality and hyponatremia: Secondary | ICD-10-CM | POA: Diagnosis not present

## 2016-06-12 DIAGNOSIS — E872 Acidosis, unspecified: Secondary | ICD-10-CM | POA: Diagnosis present

## 2016-06-12 DIAGNOSIS — E118 Type 2 diabetes mellitus with unspecified complications: Secondary | ICD-10-CM | POA: Diagnosis not present

## 2016-06-12 DIAGNOSIS — F329 Major depressive disorder, single episode, unspecified: Secondary | ICD-10-CM | POA: Diagnosis present

## 2016-06-12 DIAGNOSIS — N179 Acute kidney failure, unspecified: Secondary | ICD-10-CM | POA: Diagnosis present

## 2016-06-12 DIAGNOSIS — N183 Chronic kidney disease, stage 3 unspecified: Secondary | ICD-10-CM | POA: Diagnosis present

## 2016-06-12 DIAGNOSIS — R110 Nausea: Secondary | ICD-10-CM | POA: Diagnosis not present

## 2016-06-12 DIAGNOSIS — Z90722 Acquired absence of ovaries, bilateral: Secondary | ICD-10-CM

## 2016-06-12 DIAGNOSIS — I447 Left bundle-branch block, unspecified: Secondary | ICD-10-CM | POA: Diagnosis present

## 2016-06-12 DIAGNOSIS — Z833 Family history of diabetes mellitus: Secondary | ICD-10-CM

## 2016-06-12 DIAGNOSIS — K760 Fatty (change of) liver, not elsewhere classified: Secondary | ICD-10-CM | POA: Diagnosis present

## 2016-06-12 DIAGNOSIS — E876 Hypokalemia: Secondary | ICD-10-CM | POA: Diagnosis present

## 2016-06-12 DIAGNOSIS — K219 Gastro-esophageal reflux disease without esophagitis: Secondary | ICD-10-CM | POA: Diagnosis present

## 2016-06-12 DIAGNOSIS — E860 Dehydration: Secondary | ICD-10-CM | POA: Diagnosis not present

## 2016-06-12 DIAGNOSIS — I2510 Atherosclerotic heart disease of native coronary artery without angina pectoris: Secondary | ICD-10-CM | POA: Diagnosis present

## 2016-06-12 DIAGNOSIS — E1165 Type 2 diabetes mellitus with hyperglycemia: Secondary | ICD-10-CM | POA: Diagnosis present

## 2016-06-12 DIAGNOSIS — M109 Gout, unspecified: Secondary | ICD-10-CM | POA: Diagnosis present

## 2016-06-12 DIAGNOSIS — R531 Weakness: Secondary | ICD-10-CM

## 2016-06-12 DIAGNOSIS — E1065 Type 1 diabetes mellitus with hyperglycemia: Secondary | ICD-10-CM | POA: Diagnosis not present

## 2016-06-12 DIAGNOSIS — Z6841 Body Mass Index (BMI) 40.0 and over, adult: Secondary | ICD-10-CM

## 2016-06-12 DIAGNOSIS — Z79899 Other long term (current) drug therapy: Secondary | ICD-10-CM

## 2016-06-12 DIAGNOSIS — M545 Low back pain: Secondary | ICD-10-CM | POA: Diagnosis present

## 2016-06-12 DIAGNOSIS — I959 Hypotension, unspecified: Secondary | ICD-10-CM | POA: Diagnosis present

## 2016-06-12 DIAGNOSIS — Z95810 Presence of automatic (implantable) cardiac defibrillator: Secondary | ICD-10-CM

## 2016-06-12 DIAGNOSIS — Z9049 Acquired absence of other specified parts of digestive tract: Secondary | ICD-10-CM

## 2016-06-12 DIAGNOSIS — E1122 Type 2 diabetes mellitus with diabetic chronic kidney disease: Secondary | ICD-10-CM | POA: Diagnosis present

## 2016-06-12 DIAGNOSIS — Z90710 Acquired absence of both cervix and uterus: Secondary | ICD-10-CM

## 2016-06-12 DIAGNOSIS — I480 Paroxysmal atrial fibrillation: Secondary | ICD-10-CM | POA: Diagnosis present

## 2016-06-12 DIAGNOSIS — I428 Other cardiomyopathies: Secondary | ICD-10-CM

## 2016-06-12 DIAGNOSIS — I248 Other forms of acute ischemic heart disease: Secondary | ICD-10-CM | POA: Diagnosis present

## 2016-06-12 DIAGNOSIS — I130 Hypertensive heart and chronic kidney disease with heart failure and stage 1 through stage 4 chronic kidney disease, or unspecified chronic kidney disease: Secondary | ICD-10-CM | POA: Diagnosis present

## 2016-06-12 DIAGNOSIS — E559 Vitamin D deficiency, unspecified: Secondary | ICD-10-CM | POA: Diagnosis present

## 2016-06-12 DIAGNOSIS — Z7902 Long term (current) use of antithrombotics/antiplatelets: Secondary | ICD-10-CM

## 2016-06-12 DIAGNOSIS — R404 Transient alteration of awareness: Secondary | ICD-10-CM | POA: Diagnosis not present

## 2016-06-12 DIAGNOSIS — R6889 Other general symptoms and signs: Secondary | ICD-10-CM | POA: Diagnosis present

## 2016-06-12 DIAGNOSIS — I5022 Chronic systolic (congestive) heart failure: Secondary | ICD-10-CM | POA: Diagnosis present

## 2016-06-12 DIAGNOSIS — T502X5A Adverse effect of carbonic-anhydrase inhibitors, benzothiadiazides and other diuretics, initial encounter: Secondary | ICD-10-CM | POA: Diagnosis not present

## 2016-06-12 DIAGNOSIS — M79671 Pain in right foot: Secondary | ICD-10-CM

## 2016-06-12 DIAGNOSIS — R05 Cough: Secondary | ICD-10-CM | POA: Diagnosis not present

## 2016-06-12 DIAGNOSIS — G8929 Other chronic pain: Secondary | ICD-10-CM | POA: Diagnosis present

## 2016-06-12 DIAGNOSIS — Z794 Long term (current) use of insulin: Secondary | ICD-10-CM

## 2016-06-12 DIAGNOSIS — I252 Old myocardial infarction: Secondary | ICD-10-CM

## 2016-06-12 DIAGNOSIS — I4891 Unspecified atrial fibrillation: Secondary | ICD-10-CM | POA: Diagnosis present

## 2016-06-12 DIAGNOSIS — D509 Iron deficiency anemia, unspecified: Secondary | ICD-10-CM | POA: Diagnosis present

## 2016-06-12 DIAGNOSIS — I429 Cardiomyopathy, unspecified: Secondary | ICD-10-CM | POA: Diagnosis present

## 2016-06-12 DIAGNOSIS — K5900 Constipation, unspecified: Secondary | ICD-10-CM | POA: Diagnosis present

## 2016-06-12 DIAGNOSIS — Z885 Allergy status to narcotic agent status: Secondary | ICD-10-CM

## 2016-06-12 DIAGNOSIS — N189 Chronic kidney disease, unspecified: Secondary | ICD-10-CM | POA: Diagnosis present

## 2016-06-12 LAB — I-STAT CHEM 8, ED
BUN: 46 mg/dL — ABNORMAL HIGH (ref 6–20)
BUN: 46 mg/dL — ABNORMAL HIGH (ref 6–20)
CHLORIDE: 84 mmol/L — AB (ref 101–111)
CREATININE: 2.3 mg/dL — AB (ref 0.44–1.00)
Calcium, Ion: 0.94 mmol/L — ABNORMAL LOW (ref 1.15–1.40)
Calcium, Ion: 1 mmol/L — ABNORMAL LOW (ref 1.15–1.40)
Chloride: 84 mmol/L — ABNORMAL LOW (ref 101–111)
Creatinine, Ser: 2.3 mg/dL — ABNORMAL HIGH (ref 0.44–1.00)
GLUCOSE: 348 mg/dL — AB (ref 65–99)
GLUCOSE: 315 mg/dL — AB (ref 65–99)
HCT: 47 % — ABNORMAL HIGH (ref 36.0–46.0)
HCT: 42 % (ref 36.0–46.0)
HEMOGLOBIN: 16 g/dL — AB (ref 12.0–15.0)
Hemoglobin: 14.3 g/dL (ref 12.0–15.0)
POTASSIUM: 3.4 mmol/L — AB (ref 3.5–5.1)
POTASSIUM: 3.3 mmol/L — AB (ref 3.5–5.1)
Sodium: 126 mmol/L — ABNORMAL LOW (ref 135–145)
Sodium: 125 mmol/L — ABNORMAL LOW (ref 135–145)
TCO2: 29 mmol/L (ref 0–100)
TCO2: 32 mmol/L (ref 0–100)

## 2016-06-12 LAB — CBG MONITORING, ED: GLUCOSE-CAPILLARY: 329 mg/dL — AB (ref 65–99)

## 2016-06-12 LAB — I-STAT TROPONIN, ED: Troponin i, poc: 0.14 ng/mL (ref 0.00–0.08)

## 2016-06-12 LAB — CBC WITH DIFFERENTIAL/PLATELET
BASOS ABS: 0 10*3/uL (ref 0.0–0.1)
BASOS PCT: 0 %
EOS PCT: 0 %
Eosinophils Absolute: 0.1 10*3/uL (ref 0.0–0.7)
HCT: 41.3 % (ref 36.0–46.0)
Hemoglobin: 14.1 g/dL (ref 12.0–15.0)
Lymphocytes Relative: 13 %
Lymphs Abs: 2.1 10*3/uL (ref 0.7–4.0)
MCH: 29.9 pg (ref 26.0–34.0)
MCHC: 34.1 g/dL (ref 30.0–36.0)
MCV: 87.7 fL (ref 78.0–100.0)
MONO ABS: 1.9 10*3/uL — AB (ref 0.1–1.0)
Monocytes Relative: 12 %
NEUTROS ABS: 12 10*3/uL — AB (ref 1.7–7.7)
Neutrophils Relative %: 75 %
PLATELETS: 181 10*3/uL (ref 150–400)
RBC: 4.71 MIL/uL (ref 3.87–5.11)
RDW: 12.7 % (ref 11.5–15.5)
WBC: 16 10*3/uL — AB (ref 4.0–10.5)

## 2016-06-12 LAB — I-STAT CG4 LACTIC ACID, ED
LACTIC ACID, VENOUS: 2.72 mmol/L — AB (ref 0.5–1.9)
LACTIC ACID, VENOUS: 1.58 mmol/L (ref 0.5–1.9)

## 2016-06-12 LAB — INFLUENZA PANEL BY PCR (TYPE A & B)
Influenza A By PCR: NEGATIVE
Influenza B By PCR: NEGATIVE

## 2016-06-12 LAB — DIGOXIN LEVEL: Digoxin Level: 1.3 ng/mL (ref 0.8–2.0)

## 2016-06-12 MED ORDER — ONDANSETRON HCL 4 MG/2ML IJ SOLN
4.0000 mg | Freq: Once | INTRAMUSCULAR | Status: AC
  Administered 2016-06-12: 4 mg via INTRAVENOUS
  Filled 2016-06-12: qty 2

## 2016-06-12 MED ORDER — ACETAMINOPHEN 500 MG PO TABS
1000.0000 mg | ORAL_TABLET | Freq: Once | ORAL | Status: AC
  Administered 2016-06-12: 1000 mg via ORAL
  Filled 2016-06-12: qty 2

## 2016-06-12 MED ORDER — POTASSIUM CHLORIDE IN NACL 20-0.9 MEQ/L-% IV SOLN
Freq: Once | INTRAVENOUS | Status: AC
  Administered 2016-06-12: 23:00:00 via INTRAVENOUS
  Filled 2016-06-12: qty 1000

## 2016-06-12 MED ORDER — SODIUM CHLORIDE 0.9 % IV BOLUS (SEPSIS)
1000.0000 mL | Freq: Once | INTRAVENOUS | Status: AC
  Administered 2016-06-12: 1000 mL via INTRAVENOUS

## 2016-06-12 MED ORDER — SODIUM CHLORIDE 0.9 % IV BOLUS (SEPSIS): 500.0000 mL | Freq: Once | INTRAVENOUS | Status: DC

## 2016-06-12 MED ORDER — BENZONATATE 100 MG PO CAPS
200.0000 mg | ORAL_CAPSULE | Freq: Once | ORAL | Status: AC
  Administered 2016-06-12: 200 mg via ORAL
  Filled 2016-06-12: qty 2

## 2016-06-12 NOTE — ED Provider Notes (Signed)
[facility] DEPT Provider Note   CSN: [id number] Arrival date & time: 06/12/16  1935     History   Chief Complaint Chief Complaint  Patient presents with  . Cough  . Weakness    HPI Victoria Adams is a 76 y.o. female.  HPI Per EMS, pt from home w/ flu-like symptoms including weakness, productive cough, sore throat, nausea and general "feeling bad".  Pt has been unable eat but she has had increased CBG (diabetic).  Also complains of constipation.  Hx of cardiac issues including having a pacemaker.    Symptoms started approximately 1 week ago  No vomiting but hardly eating anything  No CP/SOB, no exertional symptoms  Entire body aches, feels very tired. No rectal bleeding or melena  Past Medical History:  Diagnosis Date  . Allergic rhinitis   . 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 Atrial fibrillation ([place])   . Chronic low back pain    Secondary to DJD  . Chronic systolic heart failure (HCC)    a. echo (5/15):  mod LVH, EF 20-25%, diff HK, severe LAE, mild RAE  . Coronary atherosclerosis-non obstructive    LHC (5/15):  EF 40-45% global HK; long LAD 40-60%, ostial 1st major septal perf 80-90%, ostial CFX ?%, mid AVCFX extensive Ca2+, dist PDA 50%  . Depression   . Essential hypertension, benign   . Fatty liver disease, nonalcoholic   . GERD (gastroesophageal reflux disease)   . Iron deficiency anemia   . Mitral regurgitation   . NICM (nonischemic cardiomyopathy) ([place])    a. echo (5/15):  mod LVH, EF 20-25%, diff HK, severe LAE, mild RAE  . NSTEMI (non-ST elevated myocardial infarction) ([place]) may 2015   No CAD  . Obesity   . Osteopenia   . PONV (postoperative nausea and vomiting)   . Rosacea   . Type 2 diabetes mellitus ([place])   . Vitamin D deficiency     Patient Active Problem List   Diagnosis Date Noted  . Hyponatremia 06/12/2016  . Flu-like symptoms 06/12/2016  . AKI (acute kidney injury) ([place]) 06/12/2016  . Weakness generalized 06/12/2016  . Lactic acid  acidosis 06/12/2016  . Type 2 diabetes mellitus with complication ([place])   . Falls 09/13/2014  . CKD (chronic kidney disease), stage III 09/13/2014  . Benign neoplasm of colon 12/20/2013  . Hyperkalemia 10/03/2013  . Orthostatic hypotension 10/03/2013  . IDA (iron deficiency anemia) 08/24/2013  . NSTEMI (non-ST elevated myocardial infarction) ([place]) 08/22/2013  . Long term (current) use of anticoagulants 07/20/2010  . NICM (nonischemic cardiomyopathy) ([place]) 10/28/2009  . LBBB (left bundle branch block) 10/28/2009  . Atrial fibrillation ([place]) 10/28/2009  . DYSLIPIDEMIA 06/06/2009  . Morbid obesity ([place]) 06/06/2009  . Essential hypertension, benign 06/06/2009  . Atherosclerosis of native coronary artery 06/06/2009  . Chronic systolic heart failure ([place]) 06/06/2009    Past Surgical History:  Procedure Laterality Date  . ABDOMINAL HYSTERECTOMY    . APPENDECTOMY    . BREAST BIOPSY     RIGHT  . CARDIAC CATHETERIZATION  2010   LAD 50%, CFX 30%, RCA 40%; EF by echo 25-35%  . CATARACT EXTRACTION Bilateral   . CHOLECYSTECTOMY    . COLONOSCOPY WITH PROPOFOL N/A 12/20/2013   Procedure: COLONOSCOPY WITH PROPOFOL;  Surgeon: Milus Banister, MD;  Location: [facility] END[facility];  Service: Endoscopy;  Laterality: N/A;  . EP IMPLANTABLE DEVICE N/A 04/16/2016   Procedure: BiV ICD Insertion CRT-D;  Surgeon: Deboraha Sprang,  MD;  Location: [facility] CV LAB;  Service: Cardiovascular;  Laterality: N/A;  . ESOPHAGOGASTRODUODENOSCOPY N/A 08/25/2013   Procedure: ESOPHAGOGASTRODUODENOSCOPY (EGD);  Surgeon: Milus Banister, MD;  Location: [facility];  Service: Endoscopy;  Laterality: N/A;  . LEFT HEART CATHETERIZATION WITH CORONARY ANGIOGRAM N/A 08/24/2013   Procedure: LEFT HEART CATHETERIZATION WITH CORONARY ANGIOGRAM;  Surgeon: Leonie Man, MD;  Location[facility];  Service: Cardiovascular;  Laterality: N/A;  . ROTATOR CUFF REPAIR     RIGHT SHOULDER  . TONSILLECTOMY  yrs ago  . TOTAL ABDOMINAL HYSTERECTOMY  W/ BILATERAL SALPINGOOPHORECTOMY     for dermoid tumor    OB History    No data available       Home Medications    Prior to Admission medications   Medication Sig Start Date End Date Taking? Authorizing Provider  Calcium Carb-Cholecalciferol (CALCIUM 600 + D PO) Take 1 tablet by mouth 2 (two) times daily.     Historical Provider, MD  carvedilol (COREG) 25 MG tablet Take 1 tablet (25 mg total) by mouth 2 (two) times daily with a meal. 02/19/16   Amy D Clegg, NP  digoxin (LANOXIN) 0.125 MG tablet Take 1 tablet (0.125 mg total) by mouth daily. 04/17/16   Eileen Stanford, PA-C  dimenhyDRINATE (DRAMAMINE) 50 MG tablet Take 50 mg by mouth every 8 (eight) hours as needed for nausea.    Historical Provider, MD  diphenhydrAMINE (SOMINEX) 25 MG tablet Take 25 mg by mouth at bedtime as needed for itching or sleep.    Historical Provider, MD  doxycycline (VIBRAMYCIN) 100 MG capsule Take 100-200 mg by mouth 2 (two) times daily as needed (rosacea).     Historical Provider, MD  ELIQUIS 5 MG TABS tablet TAKE (1) TABLET TWICE DAILY. 10/04/14   Amy D Ninfa Meeker, NP  ferrous sulfate 325 (65 FE) MG tablet TAKE 1 TABLET ONCE DAILY WITH BREAKFAST. 10/04/14   Deboraha Sprang, MD  furosemide (LASIX) 80 MG tablet Take 80 mg by mouth 2 (two) times daily. 10/03/13   Amy D Clegg, NP  insulin glargine (LANTUS) 100 UNIT/ML injection Inject 40-80 Units into the skin 2 (two) times daily. 80 units in the morning and 40 units in the evening    Historical Provider, MD  metoprolol succinate (TOPROL-XL) 25 MG 24 hr tablet Take 1 tablet (25 mg total) by mouth daily. 04/18/16   Eileen Stanford, PA-C  metroNIDAZOLE (METROGEL) 0.75 % gel Apply 1 application topically 2 (two) times daily as needed (rosacea).    Historical Provider, MD  mometasone (ELOCON) 0.1 % lotion Apply 1 application topically daily as needed (eczema). EAR ECZEMA    Historical Provider, MD  oxyCODONE-acetaminophen (PERCOCET) 10-325 MG per tablet Take 1 tablet by  mouth every 6 (six) hours as needed for pain.     Historical Provider, MD  polyethylene glycol (MIRALAX / GLYCOLAX) packet Take 17 g by mouth daily.    Historical Provider, MD  rosuvastatin (CRESTOR) 20 MG tablet TAKE 1 TABLET DAILY AT 6 PM 06/08/16   Larey Dresser, MD  sacubitril-valsartan (ENTRESTO) 24-26 MG TAKE (1) TABLET TWICE DAILY. 02/23/16   Larey Dresser, MD  spironolactone (ALDACTONE) 25 MG tablet Take 1 tablet (25 mg total) by mouth daily. 05/06/16   Shirley Friar, PA-C  venlafaxine (EFFEXOR) 75 MG tablet Take 75 mg by mouth 2 (two) times daily.     Historical Provider, MD    Family History Family History  Problem Relation Age of  Onset  . Lung cancer Father   . Coronary artery disease Father   . Diabetes Father   . Lung cancer Mother   . Asthma Brother   . Other Grandchild     Grandchild with tetralogy of Fallot and Hirschsprng disease    Social History Social History  Substance Use Topics  . Smoking status: Never Smoker  . Smokeless tobacco: Never Used  . Alcohol use No     Allergies   Codeine and Tylox [oxycodone-acetaminophen]   Review of Systems Review of Systems  Constitutional: Positive for chills. Negative for fever.  Respiratory: Positive for cough. Negative for shortness of breath.   Cardiovascular: Negative for chest pain and leg swelling.  Allergic/Immunologic: Negative for immunocompromised state.  All other systems reviewed and are negative.    Physical Exam Updated Vital Signs BP (!) 113/49 (BP Location: Left Arm)   Pulse 64   Temp 98 F (36.7 C) (Oral)   Resp 18   SpO2 97%   Physical Exam  Constitutional: She appears well-developed and well-nourished. No distress.  HENT:  Head: Normocephalic and atraumatic.  Very dry mucous membranes  Eyes: Conjunctivae are normal.  Neck: Neck supple.  Cardiovascular: Normal rate and regular rhythm.   No murmur heard. Pulmonary/Chest: Effort normal and breath sounds normal. No  respiratory distress.  Abdominal: Soft. There is no tenderness.  Musculoskeletal: She exhibits edema (bilateral lower extremities pitting edema, baseline).  Neurological: She is alert.  Skin: Skin is warm and dry.  Psychiatric: She has a normal mood and affect.  Nursing note and vitals reviewed.    ED Treatments / Results  Labs (all labs ordered are listed, but only abnormal results are displayed) Labs Reviewed  CBC WITH DIFFERENTIAL/PLATELET - Abnormal; Notable for the following:       Result Value   WBC 16.0 (*)    Neutro Abs 12.0 (*)    Monocytes Absolute 1.9 (*)    All other components within normal limits  I-STAT CHEM 8, ED - Abnormal; Notable for the following:    Sodium 126 (*)    Potassium 3.4 (*)    Chloride 84 (*)    BUN 46 (*)    Creatinine, Ser 2.30 (*)    Glucose, Bld 348 (*)    Calcium, Ion 0.94 (*)    Hemoglobin 16.0 (*)    HCT 47.0 (*)    All other components within normal limits  I-STAT CG4 LACTIC ACID, ED - Abnormal; Notable for the following:    Lactic Acid, Venous 2.72 (*)    All other components within normal limits  CBG MONITORING, ED - Abnormal; Notable for the following:    Glucose-Capillary 329 (*)    All other components within normal limits  I-STAT CHEM 8, ED - Abnormal; Notable for the following:    Sodium 125 (*)    Potassium 3.3 (*)    Chloride 84 (*)    BUN 46 (*)    Creatinine, Ser 2.30 (*)    Glucose, Bld 315 (*)    Calcium, Ion 1.00 (*)    All other components within normal limits  I-STAT TROPOININ, ED - Abnormal; Notable for the following:    Troponin i, poc 0.14 (*)    All other components within normal limits  DIGOXIN LEVEL  INFLUENZA PANEL BY PCR (TYPE A & B)  I-STAT CG4 LACTIC ACID, ED  I-STAT CG4 LACTIC ACID, ED    EKG  EKG Interpretation  Date/Time:  Saturday June 12 2016 20:23:06 EST Ventricular Rate:  66 PR Interval:    QRS Duration: 162 QT Interval:  465 QTC Calculation: 488 R Axis:   -142 Text  Interpretation:  VENTRICULAR PACED RHYTHM Artifact Abnormal ekg Confirmed by Carmin Muskrat  MD ([id number]) on 06/12/2016 9:13:03 PM       Radiology Dg Chest 2 View  Result Date: 06/12/2016 CLINICAL DATA:  Cough for 3 days. EXAM: CHEST  2 VIEW COMPARISON:  Radiographs 04/17/2016 FINDINGS: Left-sided pacemaker remains in place, leads unchanged in position. Coronary artery calcifications versus stents. There is stable cardiomegaly from prior exam. No pulmonary edema, focal airspace disease or pleural fluid. No pneumothorax. Stable osseous structures. IMPRESSION: Stable cardiomegaly.  No acute abnormality. Electronically Signed   By: Jeb Levering M.D.   On: 06/12/2016 21:13    Procedures Procedures (including critical care time)  Medications Ordered in ED Medications  acetaminophen (TYLENOL) tablet 1,000 mg (1,000 mg Oral Given 06/12/16 2026)  ondansetron (ZOFRAN) injection 4 mg (4 mg Intravenous Given 06/12/16 2023)  benzonatate (TESSALON) capsule 200 mg (200 mg Oral Given 06/12/16 2139)  sodium chloride 0.9 % bolus 1,000 mL (1,000 mLs Intravenous Transfusing/Transfer 06/13/16 0008)  0.9 % NaCl with KCl 20 mEq/ L  infusion ( Intravenous New Bag/Given 06/12/16 2326)     Initial Impression / Assessment and Plan / ED Course  I have reviewed the triage vital signs and the nursing notes.  Pertinent labs & imaging results that were available during my care of the patient were reviewed by me and considered in my medical decision making (see chart for details).     New-onset of weakness secondary to likely viral etiology No fever, chest x-ray without pneumonia, does not have any urinary symptoms, abdominal pain to suggest UTI However, does have prominent cough which is productive consistent with URI in the setting of negative chest x-ray Her symptoms are consistent with the onset of cough and I do not suspect occult UTI or bacteremia Patient does not have a fever Patient has continued to take her  multiple diuretics as well as antihypertensives This is in the setting of decreased by mouth intake Patient now has mild creatinine elevation without significant hyperkalemia She has hyponatremia and despite fluid replacement continues to be hyponatremic istat trop positive but EKG wo ACS and no CP/SOB to suggest ischemia She initially was hyperglycemic but this has improved with fluids Will admit the patient for further management   Final Clinical Impressions(s) / ED Diagnoses   Final diagnoses:  Generalized weakness  Hyponatremia  Dehydration    New Prescriptions New Prescriptions   No medications on file     Karma Greaser, MD 06/13/16 0008    Carmin Muskrat, MD 06/16/16 [phone]

## 2016-06-12 NOTE — ED Triage Notes (Signed)
Per EMS, pt from home w/ flu-like symptoms including weakness, productive cough, sore throat, nausea and general "feeling bad".  Pt has been unable eat but she has had increased CBG (diabetic).  Also complains of constipation.  Hx of cardiac issues including having a pacemaker.

## 2016-06-13 ENCOUNTER — Encounter (HOSPITAL_COMMUNITY): Payer: Self-pay | Admitting: Orthopedic Surgery

## 2016-06-13 DIAGNOSIS — E118 Type 2 diabetes mellitus with unspecified complications: Secondary | ICD-10-CM | POA: Diagnosis not present

## 2016-06-13 DIAGNOSIS — I5022 Chronic systolic (congestive) heart failure: Secondary | ICD-10-CM

## 2016-06-13 DIAGNOSIS — N183 Chronic kidney disease, stage 3 (moderate): Secondary | ICD-10-CM | POA: Diagnosis not present

## 2016-06-13 DIAGNOSIS — R531 Weakness: Secondary | ICD-10-CM | POA: Diagnosis not present

## 2016-06-13 DIAGNOSIS — E871 Hypo-osmolality and hyponatremia: Secondary | ICD-10-CM | POA: Diagnosis not present

## 2016-06-13 DIAGNOSIS — I481 Persistent atrial fibrillation: Secondary | ICD-10-CM | POA: Diagnosis not present

## 2016-06-13 DIAGNOSIS — E872 Acidosis: Secondary | ICD-10-CM | POA: Diagnosis not present

## 2016-06-13 DIAGNOSIS — R6889 Other general symptoms and signs: Secondary | ICD-10-CM | POA: Diagnosis not present

## 2016-06-13 LAB — URINALYSIS, ROUTINE W REFLEX MICROSCOPIC
BILIRUBIN URINE: NEGATIVE
GLUCOSE, UA: 150 mg/dL — AB
HGB URINE DIPSTICK: NEGATIVE
Ketones, ur: NEGATIVE mg/dL
Leukocytes, UA: NEGATIVE
NITRITE: NEGATIVE
PH: 5 (ref 5.0–8.0)
Protein, ur: NEGATIVE mg/dL
SPECIFIC GRAVITY, URINE: 1.01 (ref 1.005–1.030)

## 2016-06-13 LAB — BASIC METABOLIC PANEL
Anion gap: 12 (ref 5–15)
BUN: 45 mg/dL — AB (ref 6–20)
CHLORIDE: 91 mmol/L — AB (ref 101–111)
CO2: 25 mmol/L (ref 22–32)
Calcium: 7.8 mg/dL — ABNORMAL LOW (ref 8.9–10.3)
Creatinine, Ser: 2.13 mg/dL — ABNORMAL HIGH (ref 0.44–1.00)
GFR calc Af Amer: 25 mL/min — ABNORMAL LOW (ref >60)
GFR calc non Af Amer: 22 mL/min — ABNORMAL LOW (ref >60)
Glucose, Bld: 144 mg/dL — ABNORMAL HIGH (ref 65–99)
POTASSIUM: 3.5 mmol/L (ref 3.5–5.1)
SODIUM: 128 mmol/L — AB (ref 135–145)

## 2016-06-13 LAB — CBC
HEMATOCRIT: 39.6 % (ref 36.0–46.0)
Hemoglobin: 13.1 g/dL (ref 12.0–15.0)
MCH: 29.3 pg (ref 26.0–34.0)
MCHC: 33.1 g/dL (ref 30.0–36.0)
MCV: 88.6 fL (ref 78.0–100.0)
Platelets: 162 10*3/uL (ref 150–400)
RBC: 4.47 MIL/uL (ref 3.87–5.11)
RDW: 12.9 % (ref 11.5–15.5)
WBC: 12.5 10*3/uL — AB (ref 4.0–10.5)

## 2016-06-13 LAB — TROPONIN I: Troponin I: 0.23 ng/mL (ref <0.03)

## 2016-06-13 LAB — GLUCOSE, CAPILLARY
GLUCOSE-CAPILLARY: 259 mg/dL — AB (ref 65–99)
GLUCOSE-CAPILLARY: 270 mg/dL — AB (ref 65–99)
Glucose-Capillary: 161 mg/dL — ABNORMAL HIGH (ref 65–99)
Glucose-Capillary: 195 mg/dL — ABNORMAL HIGH (ref 65–99)
Glucose-Capillary: 207 mg/dL — ABNORMAL HIGH (ref 65–99)

## 2016-06-13 LAB — MAGNESIUM: Magnesium: 1.9 mg/dL (ref 1.7–2.4)

## 2016-06-13 MED ORDER — ONDANSETRON HCL 4 MG PO TABS: 4.0000 mg | ORAL_TABLET | Freq: Four times a day (QID) | ORAL | Status: DC | PRN

## 2016-06-13 MED ORDER — INSULIN ASPART 100 UNIT/ML ~~LOC~~ SOLN
0.0000 [IU] | Freq: Three times a day (TID) | SUBCUTANEOUS | Status: DC
  Administered 2016-06-13: 2 [IU] via SUBCUTANEOUS
  Administered 2016-06-13: 5 [IU] via SUBCUTANEOUS
  Administered 2016-06-13: 3 [IU] via SUBCUTANEOUS
  Administered 2016-06-14: 2 [IU] via SUBCUTANEOUS
  Administered 2016-06-14: 7 [IU] via SUBCUTANEOUS
  Administered 2016-06-14: 5 [IU] via SUBCUTANEOUS
  Administered 2016-06-15: 9 [IU] via SUBCUTANEOUS
  Administered 2016-06-15 (×2): 7 [IU] via SUBCUTANEOUS
  Administered 2016-06-16 (×3): 9 [IU] via SUBCUTANEOUS

## 2016-06-13 MED ORDER — DIGOXIN 125 MCG PO TABS
0.1250 mg | ORAL_TABLET | Freq: Every day | ORAL | Status: DC
  Administered 2016-06-13 – 2016-06-16 (×4): 0.125 mg via ORAL
  Filled 2016-06-13 (×4): qty 1

## 2016-06-13 MED ORDER — INSULIN ASPART 100 UNIT/ML ~~LOC~~ SOLN: 0.0000 [IU] | Freq: Three times a day (TID) | SUBCUTANEOUS | Status: DC

## 2016-06-13 MED ORDER — INSULIN ASPART 100 UNIT/ML ~~LOC~~ SOLN: 0.0000 [IU] | Freq: Every day | SUBCUTANEOUS | Status: DC

## 2016-06-13 MED ORDER — GUAIFENESIN-DM 100-10 MG/5ML PO SYRP
5.0000 mL | ORAL_SOLUTION | ORAL | Status: DC | PRN
  Administered 2016-06-13: 5 mL via ORAL
  Filled 2016-06-13: qty 5

## 2016-06-13 MED ORDER — ONDANSETRON HCL 4 MG/2ML IJ SOLN: 4.0000 mg | Freq: Four times a day (QID) | INTRAMUSCULAR | Status: DC | PRN

## 2016-06-13 MED ORDER — POTASSIUM CHLORIDE CRYS ER 20 MEQ PO TBCR
20.0000 meq | EXTENDED_RELEASE_TABLET | Freq: Once | ORAL | Status: AC
  Administered 2016-06-13: 20 meq via ORAL
  Filled 2016-06-13: qty 1

## 2016-06-13 MED ORDER — POTASSIUM CHLORIDE IN NACL 20-0.9 MEQ/L-% IV SOLN: INTRAVENOUS | Status: AC

## 2016-06-13 MED ORDER — OXYCODONE HCL 5 MG PO TABS
5.0000 mg | ORAL_TABLET | ORAL | Status: DC | PRN
  Administered 2016-06-13: 10 mg via ORAL
  Administered 2016-06-13: 5 mg via ORAL
  Administered 2016-06-13 – 2016-06-16 (×7): 10 mg via ORAL
  Filled 2016-06-13 (×2): qty 2
  Filled 2016-06-13: qty 1
  Filled 2016-06-13 (×7): qty 2

## 2016-06-13 MED ORDER — INSULIN ASPART 100 UNIT/ML ~~LOC~~ SOLN
0.0000 [IU] | Freq: Every day | SUBCUTANEOUS | Status: DC
  Administered 2016-06-14: 4 [IU] via SUBCUTANEOUS

## 2016-06-13 MED ORDER — PHENOL 1.4 % MT LIQD
1.0000 | OROMUCOSAL | Status: DC | PRN
  Filled 2016-06-13: qty 177

## 2016-06-13 NOTE — Progress Notes (Signed)
Patient ID: Victoria Adams, female   DOB: Sep 04, 1940, 76 y.o.   MRN: [id number]                                                                PROGRESS NOTE                                                                                                                                                                                                             Patient Demographics:    Victoria Adams, is a 76 y.o. female, DOB - [birth year], [id number]  Admit date - 06/12/2016   Admitting Physician Phillips Grout, MD  Outpatient Primary MD for the patient is Gar Ponto, MD  LOS - 0  Outpatient Specialists:    Chief Complaint  Patient presents with  . Cough  . Weakness       Brief Narrative  76 y.o. female with medical history significant of CHF EF 20%, recent AICD placed in jan 18, afib, CAD, DM, HTN comes in with over 4 days of flu like symptoms with body aches, nasal congestion and cough.  She has not been eating or drinking very well for several days.  Has progressively gotten more weak.  Denies fevers but has chills sometimes.  No n/v/d.  No urinary symptoms.  Pt thinks she has had the flu, has not seen a doctor about this.  Pt has continued to take all her cardiac medications.  Pt found to be in AKI with hyponatremia and dehydration and referred for admission for such.    Subjective:    [name] today has been feeling stronger after some hydration.  No headache, No chest pain, No abdominal pain - No Nausea, No new weakness tingling or numbness, No Cough - SOB. Pt is doing well.  Trop in ED slightly high, but no chest pain as above.    Assessment  & Plan :    Principal Problem:   Hyponatremia Active Problems:   NICM (nonischemic cardiomyopathy) (HCC)   LBBB (left bundle branch block)   Atrial fibrillation (HCC)   Chronic systolic heart failure (HCC)   IDA (iron deficiency anemia)   CKD (chronic kidney disease), stage III   Flu-like symptoms   AKI (acute kidney injury)  (HCC)   Weakness generalized   Type  2 diabetes mellitus with complication (HCC)   Lactic acid acidosis   1.  ARF on CRF Baseline creatinine 1.6 Pt receiving normal saline 33mL per hour  2. Hyponatremia secondary to dehydration and diuretics.  Cont ns iv Check cmp in am  3. Hypokalemia Kcl 20 meq po x1 Check cmp in am  4. Hypotension Pt receiving ns iv  5. CHF (EF 20%) Holding diuretics,  Check daily weight, strict I and O  6. Pafib Eliquis on hold due to ARF Restart on digoxin Please restart in am  7. Dm2 bs 300 Restart on lantus  8.Leukocytosis Repeat cbc, check ua  9. Trop elevation Repeat trop in am  Code Status : FULL CODE  Family Communication  : w patient  Disposition Plan  : home  Barriers For Discharge :   Consults  :  none  Procedures  :   DVT Prophylaxis  :   SCDs   Lab Results  Component Value Date   PLT 181 06/12/2016    Antibiotics  :    Anti-infectives    None        Objective:   Vitals:   06/13/16 0000 06/13/16 0055 06/13/16 0200 06/13/16 0554  BP: (!) 115/50 (!) 95/42    Pulse: 66 68    Resp:  18  16  Temp:  97.5 F (36.4 C)    TempSrc:  Oral    SpO2: 96% 96%  97%  Weight:   127.5 kg (281 lb 1.4 oz)   Height:   5\' 7"  (1.702 m)     Wt Readings from Last 3 Encounters:  06/13/16 127.5 kg (281 lb 1.4 oz)  05/06/16 127.5 kg (281 lb)  04/17/16 127.1 kg (280 lb 1.6 oz)     Intake/Output Summary (Last 24 hours) at 06/13/16 0735 Last data filed at 06/13/16 0200  Gross per 24 hour  Intake              200 ml  Output                0 ml  Net              200 ml     Physical Exam  Awake Alert, Oriented X 3, No new F.N deficits, Normal affect .AT,PERRAL Supple Neck,No JVD, No cervical lymphadenopathy appriciated.  Symmetrical Chest wall movement, Good air movement bilaterally, CTAB RRR,No Gallops,Rubs or new Murmurs, No Parasternal Heave +ve B.Sounds, Abd Soft, No tenderness, No organomegaly appriciated, No  rebound - guarding or rigidity. No Cyanosis, Clubbing or edema, No new Rash or bruise      Data Review:    CBC  Recent Labs Lab 06/12/16 1955 06/12/16 1958 06/12/16 2230  WBC  --  16.0*  --   HGB 16.0* 14.1 14.3  HCT 47.0* 41.3 42.0  PLT  --  181  --   MCV  --  87.7  --   MCH  --  29.9  --   MCHC  --  34.1  --   RDW  --  12.7  --   LYMPHSABS  --  2.1  --   MONOABS  --  1.9*  --   EOSABS  --  0.1  --   BASOSABS  --  0.0  --     Chemistries   Recent Labs Lab 06/12/16 1955 06/12/16 2230  NA 126* 125*  K 3.4* 3.3*  CL 84* 84*  GLUCOSE 348* 315*  BUN 46* 46*  CREATININE 2.30* 2.30*   ------------------------------------------------------------------------------------------------------------------ No results for input(s): CHOL, HDL, LDLCALC, TRIG, CHOLHDL, LDLDIRECT in the last 72 hours.  No results found for: HGBA1C ------------------------------------------------------------------------------------------------------------------ No results for input(s): TSH, T4TOTAL, T3FREE, THYROIDAB in the last 72 hours.  Invalid input(s): FREET3 ------------------------------------------------------------------------------------------------------------------ No results for input(s): VITAMINB12, FOLATE, FERRITIN, TIBC, IRON, RETICCTPCT in the last 72 hours.  Coagulation profile No results for input(s): INR, PROTIME in the last 168 hours.  No results for input(s): DDIMER in the last 72 hours.  Cardiac Enzymes No results for input(s): CKMB, TROPONINI, MYOGLOBIN in the last 168 hours.  Invalid input(s): CK ------------------------------------------------------------------------------------------------------------------    Component Value Date/Time   BNP 157.3 (H) 05/06/2016 1155    Inpatient Medications  Scheduled Meds: Continuous Infusions: PRN Meds:.oxyCODONE  Micro Results No results found for this or any previous visit (from the past 240 hour(s)).  Radiology  Reports Dg Chest 2 View  Result Date: 06/12/2016 CLINICAL DATA:  Cough for 3 days. EXAM: CHEST  2 VIEW COMPARISON:  Radiographs 04/17/2016 FINDINGS: Left-sided pacemaker remains in place, leads unchanged in position. Coronary artery calcifications versus stents. There is stable cardiomegaly from prior exam. No pulmonary edema, focal airspace disease or pleural fluid. No pneumothorax. Stable osseous structures. IMPRESSION: Stable cardiomegaly.  No acute abnormality. Electronically Signed   By: Jeb Levering M.D.   On: 06/12/2016 21:13    Time Spent in minutes  30   Jani Gravel M.D on 06/13/2016 at 7:35 AM  Between 7am to 7pm - Pager - [phone]  After 7pm go to [email or web address] - password [email or web address]  [facility] -  Office  [phone]

## 2016-06-13 NOTE — ED Notes (Signed)
ED RN spoke with R.David to report trop level of 0.14. No immediate action or orders needed at this time

## 2016-06-13 NOTE — Progress Notes (Signed)
CRITICAL VALUE ALERT  Critical value received:  Troponin 0.23   Date of notification:  06/13/16  Time of notification:  11:10 am  Critical value read back:Troponin 0.23   Nurse who received alert:  Shelton Silvas, RN   MD notified (1st page):  Dr. Maudie Mercury   Time of first page:  11:12  am  MD notified (2nd page):  Time of second page:  Responding MD:  Maudie Mercury   Time MD responded:  11:15 a.m

## 2016-06-13 NOTE — H&P (Signed)
History and Physical    Victoria Adams [id number] DOB: 1941/03/24 DOA: 06/12/2016  PCP: Gar Ponto, MD  Patient coming from: home  Chief Complaint:  weakness  HPI: Victoria Adams is a 76 y.o. female with medical history significant of CHF EF 20%, recent AICD placed in jan 18, afib, CAD, DM, HTN comes in with over 4 days of flu like symptoms with body aches, nasal congestion and cough.  She has not been eating or drinking very well for several days.  Has progressively gotten more weak.  Denies fevers but has chills sometimes.  No n/v/d.  No urinary symptoms.  Pt thinks she has had the flu, has not seen a doctor about this.  Pt has continued to take all her cardiac medications.  Pt found to be in AKI with hyponatremia and dehydration and referred for admission for such.   Review of Systems: As per HPI otherwise 10 point review of systems negative.   Past Medical History:  Diagnosis Date  . Allergic rhinitis   . 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 Atrial fibrillation ([place])   . Chronic low back pain    Secondary to DJD  . Chronic systolic heart failure (HCC)    a. echo (5/15):  mod LVH, EF 20-25%, diff HK, severe LAE, mild RAE  . Coronary atherosclerosis-non obstructive    LHC (5/15):  EF 40-45% global HK; long LAD 40-60%, ostial 1st major septal perf 80-90%, ostial CFX ?%, mid AVCFX extensive Ca2+, dist PDA 50%  . Depression   . Essential hypertension, benign   . Fatty liver disease, nonalcoholic   . GERD (gastroesophageal reflux disease)   . Iron deficiency anemia   . Mitral regurgitation   . NICM (nonischemic cardiomyopathy) ([place])    a. echo (5/15):  mod LVH, EF 20-25%, diff HK, severe LAE, mild RAE  . NSTEMI (non-ST elevated myocardial infarction) ([place]) may 2015   No CAD  . Obesity   . Osteopenia   . PONV (postoperative nausea and vomiting)   . Rosacea   . Type 2 diabetes mellitus ([place])   . Vitamin D deficiency     Past Surgical History:  Procedure Laterality Date  .  ABDOMINAL HYSTERECTOMY    . APPENDECTOMY    . BREAST BIOPSY     RIGHT  . CARDIAC CATHETERIZATION  2010   LAD 50%, CFX 30%, RCA 40%; EF by echo 25-35%  . CATARACT EXTRACTION Bilateral   . CHOLECYSTECTOMY    . COLONOSCOPY WITH PROPOFOL N/A 12/20/2013   Procedure: COLONOSCOPY WITH PROPOFOL;  Surgeon: Milus Banister, MD;  Location: [facility];  Service: Endoscopy;  Laterality: N/A;  . EP IMPLANTABLE DEVICE N/A 04/16/2016   Procedure: BiV ICD Insertion CRT-D;  Surgeon: Deboraha Sprang, MD;  Location: [facility];  Service: Cardiovascular;  Laterality: N/A;  . ESOPHAGOGASTRODUODENOSCOPY N/A 08/25/2013   Procedure: ESOPHAGOGASTRODUODENOSCOPY (EGD);  Surgeon: Milus Banister, MD;  Location: [facility];  Service: Endoscopy;  Laterality: N/A;  . LEFT HEART CATHETERIZATION WITH CORONARY ANGIOGRAM N/A 08/24/2013   Procedure: LEFT HEART CATHETERIZATION WITH CORONARY ANGIOGRAM;  Surgeon: Leonie Man, MD;  Location: [facility] CATH LAB;  Service: Cardiovascular;  Laterality: N/A;  . ROTATOR CUFF REPAIR     RIGHT SHOULDER  . TONSILLECTOMY  yrs ago  . TOTAL ABDOMINAL HYSTERECTOMY W/ BILATERAL SALPINGOOPHORECTOMY     for dermoid tumor     reports that she has never smoked. She has never used smokeless  tobacco. She reports that she does not drink alcohol or use drugs.  Allergies  Allergen Reactions  . Codeine Nausea And Vomiting  . Tylox [Oxycodone-Acetaminophen]     Vision closing in & heart palpitations.     Family History  Problem Relation Age of Onset  . Lung cancer Father   . Coronary artery disease Father   . Diabetes Father   . Lung cancer Mother   . Asthma Brother   . Other Grandchild     Grandchild with tetralogy of Fallot and Hirschsprng disease    Prior to Admission medications   Medication Sig Start Date End Date Taking? Authorizing Provider  Calcium Carb-Cholecalciferol (CALCIUM 600 + D PO) Take 1 tablet by mouth 2 (two) times daily.     Historical Provider, MD  carvedilol  (COREG) 25 MG tablet Take 1 tablet (25 mg total) by mouth 2 (two) times daily with a meal. 02/19/16   Amy D Clegg, NP  digoxin (LANOXIN) 0.125 MG tablet Take 1 tablet (0.125 mg total) by mouth daily. 04/17/16   Eileen Stanford, PA-C  dimenhyDRINATE (DRAMAMINE) 50 MG tablet Take 50 mg by mouth every 8 (eight) hours as needed for nausea.    Historical Provider, MD  diphenhydrAMINE (SOMINEX) 25 MG tablet Take 25 mg by mouth at bedtime as needed for itching or sleep.    Historical Provider, MD  doxycycline (VIBRAMYCIN) 100 MG capsule Take 100-200 mg by mouth 2 (two) times daily as needed (rosacea).     Historical Provider, MD  ELIQUIS 5 MG TABS tablet TAKE (1) TABLET TWICE DAILY. 10/04/14   Amy D Ninfa Meeker, NP  ferrous sulfate 325 (65 FE) MG tablet TAKE 1 TABLET ONCE DAILY WITH BREAKFAST. 10/04/14   Deboraha Sprang, MD  furosemide (LASIX) 80 MG tablet Take 80 mg by mouth 2 (two) times daily. 10/03/13   Amy D Clegg, NP  insulin glargine (LANTUS) 100 UNIT/ML injection Inject 40-80 Units into the skin 2 (two) times daily. 80 units in the morning and 40 units in the evening    Historical Provider, MD  metolazone (ZAROXOLYN) 2.5 MG tablet Take 2.5 mg (1 tablet) once daily for two days. 02/04/16   Amy D Ninfa Meeker, NP  metoprolol succinate (TOPROL-XL) 25 MG 24 hr tablet Take 1 tablet (25 mg total) by mouth daily. 04/18/16   Eileen Stanford, PA-C  metroNIDAZOLE (METROGEL) 0.75 % gel Apply 1 application topically 2 (two) times daily as needed (rosacea).    Historical Provider, MD  mometasone (ELOCON) 0.1 % lotion Apply 1 application topically daily as needed (eczema). EAR ECZEMA    Historical Provider, MD  oxyCODONE-acetaminophen (PERCOCET) 10-325 MG per tablet Take 1 tablet by mouth every 6 (six) hours as needed for pain.     Historical Provider, MD  polyethylene glycol (MIRALAX / GLYCOLAX) packet Take 17 g by mouth daily.    Historical Provider, MD  rosuvastatin (CRESTOR) 20 MG tablet TAKE 1 TABLET DAILY AT 6 PM 06/08/16    Larey Dresser, MD  sacubitril-valsartan (ENTRESTO) 24-26 MG TAKE (1) TABLET TWICE DAILY. 02/23/16   Larey Dresser, MD  spironolactone (ALDACTONE) 25 MG tablet Take 1 tablet (25 mg total) by mouth daily. 05/06/16   Shirley Friar, PA-C  venlafaxine (EFFEXOR) 75 MG tablet Take 75 mg by mouth 2 (two) times daily.     Historical Provider, MD    Physical Exam: Vitals:   06/12/16 2015 06/12/16 2315 06/12/16 2330 06/12/16 2343  BP: (!) 108/52 Marland Kitchen)  94/42 (!) 103/49 (!) 113/49  Pulse: 61 64 64 64  Resp:    18  Temp:    98 F (36.7 C)  TempSrc:    Oral  SpO2: 93% 97% 97% 97%    Constitutional: NAD, calm, comfortable Vitals:   06/12/16 2015 06/12/16 2315 06/12/16 2330 06/12/16 2343  BP: (!) 108/52 (!) 94/42 (!) 103/49 (!) 113/49  Pulse: 61 64 64 64  Resp:    18  Temp:    98 F (36.7 C)  TempSrc:    Oral  SpO2: 93% 97% 97% 97%   Eyes: PERRL, lids and conjunctivae normal ENMT: Mucous membranes are moist. Posterior pharynx clear of any exudate or lesions.Normal dentition.  Neck: normal, supple, no masses, no thyromegaly Respiratory: clear to auscultation bilaterally, no wheezing, no crackles. Normal respiratory effort. No accessory muscle use.  Cardiovascular: Regular rate and rhythm, no murmurs / rubs / gallops. No extremity edema. 2+ pedal pulses. No carotid bruits.  Abdomen: no tenderness, no masses palpated. No hepatosplenomegaly. Bowel sounds positive.  Musculoskeletal: no clubbing / cyanosis. No joint deformity upper and lower extremities. Good ROM, no contractures. Normal muscle tone.  Skin: no rashes, lesions, ulcers. No induration Neurologic: CN 2-12 grossly intact. Sensation intact, DTR normal. Strength 5/5 in all 4.  Psychiatric: Normal judgment and insight. Alert and oriented x 3. Normal mood.    Labs on Admission: I have personally reviewed following labs and imaging studies  CBC:  Recent Labs Lab 06/12/16 1955 06/12/16 1958 06/12/16 2230  WBC  --  16.0*  --    NEUTROABS  --  12.0*  --   HGB 16.0* 14.1 14.3  HCT 47.0* 41.3 42.0  MCV  --  87.7  --   PLT  --  181  --    Basic Metabolic Panel:  Recent Labs Lab 06/12/16 1955 06/12/16 2230  NA 126* 125*  K 3.4* 3.3*  CL 84* 84*  GLUCOSE 348* 315*  BUN 46* 46*  CREATININE 2.30* 2.30*   GFR: CrCl cannot be calculated (Unknown ideal weight.).  CBG:  Recent Labs Lab 06/12/16 2026  GLUCAP 329*   Radiological Exams on Admission: Dg Chest 2 View  Result Date: 06/12/2016 CLINICAL DATA:  Cough for 3 days. EXAM: CHEST  2 VIEW COMPARISON:  Radiographs 04/17/2016 FINDINGS: Left-sided pacemaker remains in place, leads unchanged in position. Coronary artery calcifications versus stents. There is stable cardiomegaly from prior exam. No pulmonary edema, focal airspace disease or pleural fluid. No pneumothorax. Stable osseous structures. IMPRESSION: Stable cardiomegaly.  No acute abnormality. Electronically Signed   By: Jeb Levering M.D.   On: 06/12/2016 21:13    EKG: Independently reviewed. vpaced cxr reviewed no edema or infiltrate Old chart reviewed Case discussed with EDP  Assessment/Plan 76 yo female with recent flu like illness, decreased po intake, dehydration resulting in AKI   Principal Problem:   Hyponatremia- due to dehydration, ivf NS thru the night tonight  Active Problems:   AKI (acute kidney injury) ([place])- due to prerenal azotemia/ATN from decreased volume in the setting of taking multiple nephrotoxic meds.  Hold all diuretics and bp meds.  Given a liter ivf bolus in ED, gentle ivf overnight.  ua is pending.   Weakness generalized- multifactorial, nothing focal   CKD (chronic kidney disease), stage III- noted, baseline cr 1.9 up to 2.3 now   Flu-like symptoms- as above, flu neg   Lactic acid acidosis- from dehydration, AKI, no source of infection , resolved   NICM (  nonischemic cardiomyopathy) ([place])- noted, EF 20%   Atrial fibrillation ([place])- v paced at this time    Chronic systolic heart failure ([place])- compensated at  This time, holding diuretics due to above issues   IDA (iron deficiency anemia)- stable   Type 2 diabetes mellitus ([place])-  SSI with night coverage tonight ordered    Mild elevation of trop - likely due to AKI, repeat once later tonight  DVT prophylaxis: scds  Code Status:  full Family Communication:  none  Disposition Plan:  Per day team Consults called:  none Admission status:   observation   DAVID,RACHAL A MD [facility]  If 7PM-7AM, please contact night-coverage [email or web address] Password [email or web address]  06/13/2016, 12:05 AM

## 2016-06-14 ENCOUNTER — Telehealth: Payer: Self-pay | Admitting: Internal Medicine

## 2016-06-14 ENCOUNTER — Observation Stay (HOSPITAL_COMMUNITY): Payer: Medicare Other

## 2016-06-14 DIAGNOSIS — I4891 Unspecified atrial fibrillation: Secondary | ICD-10-CM | POA: Diagnosis not present

## 2016-06-14 DIAGNOSIS — M79671 Pain in right foot: Secondary | ICD-10-CM | POA: Diagnosis not present

## 2016-06-14 DIAGNOSIS — M19071 Primary osteoarthritis, right ankle and foot: Secondary | ICD-10-CM | POA: Diagnosis not present

## 2016-06-14 DIAGNOSIS — E871 Hypo-osmolality and hyponatremia: Secondary | ICD-10-CM | POA: Diagnosis not present

## 2016-06-14 DIAGNOSIS — N179 Acute kidney failure, unspecified: Secondary | ICD-10-CM | POA: Diagnosis not present

## 2016-06-14 LAB — GLUCOSE, CAPILLARY
Glucose-Capillary: 188 mg/dL — ABNORMAL HIGH (ref 65–99)
Glucose-Capillary: 270 mg/dL — ABNORMAL HIGH (ref 65–99)
Glucose-Capillary: 310 mg/dL — ABNORMAL HIGH (ref 65–99)
Glucose-Capillary: 323 mg/dL — ABNORMAL HIGH (ref 65–99)

## 2016-06-14 LAB — CK TOTAL AND CKMB (NOT AT ARMC)
CK, MB: 9.9 ng/mL — ABNORMAL HIGH (ref 0.5–5.0)
Relative Index: 2.1 (ref 0.0–2.5)
Total CK: 476 U/L — ABNORMAL HIGH (ref 38–234)

## 2016-06-14 LAB — COMPREHENSIVE METABOLIC PANEL
ALT: 16 U/L (ref 14–54)
AST: 27 U/L (ref 15–41)
Albumin: 2.4 g/dL — ABNORMAL LOW (ref 3.5–5.0)
Alkaline Phosphatase: 71 U/L (ref 38–126)
Anion gap: 6 (ref 5–15)
BUN: 38 mg/dL — ABNORMAL HIGH (ref 6–20)
CO2: 31 mmol/L (ref 22–32)
Calcium: 8 mg/dL — ABNORMAL LOW (ref 8.9–10.3)
Chloride: 93 mmol/L — ABNORMAL LOW (ref 101–111)
Creatinine, Ser: 2.04 mg/dL — ABNORMAL HIGH (ref 0.44–1.00)
GFR calc Af Amer: 26 mL/min — ABNORMAL LOW (ref >60)
GFR calc non Af Amer: 23 mL/min — ABNORMAL LOW (ref >60)
Glucose, Bld: 185 mg/dL — ABNORMAL HIGH (ref 65–99)
Potassium: 3.5 mmol/L (ref 3.5–5.1)
Sodium: 130 mmol/L — ABNORMAL LOW (ref 135–145)
Total Bilirubin: 0.6 mg/dL (ref 0.3–1.2)
Total Protein: 6.2 g/dL — ABNORMAL LOW (ref 6.5–8.1)

## 2016-06-14 LAB — CBC
HCT: 40 % (ref 36.0–46.0)
Hemoglobin: 13.2 g/dL (ref 12.0–15.0)
MCH: 29.4 pg (ref 26.0–34.0)
MCHC: 33 g/dL (ref 30.0–36.0)
MCV: 89.1 fL (ref 78.0–100.0)
Platelets: 173 10*3/uL (ref 150–400)
RBC: 4.49 MIL/uL (ref 3.87–5.11)
RDW: 12.9 % (ref 11.5–15.5)
WBC: 11.4 10*3/uL — ABNORMAL HIGH (ref 4.0–10.5)

## 2016-06-14 LAB — HEMOGLOBIN A1C
Hgb A1c MFr Bld: 12.6 % — ABNORMAL HIGH (ref 4.8–5.6)
Mean Plasma Glucose: 315 mg/dL

## 2016-06-14 LAB — TROPONIN I: TROPONIN I: 0.18 ng/mL — AB (ref <0.03)

## 2016-06-14 MED ORDER — COLCHICINE 0.6 MG PO TABS
0.6000 mg | ORAL_TABLET | Freq: Every day | ORAL | Status: DC
  Administered 2016-06-14 – 2016-06-16 (×3): 0.6 mg via ORAL
  Filled 2016-06-14 (×3): qty 1

## 2016-06-14 NOTE — Care Management Obs Status (Signed)
[facility] NOTIFICATION   Patient Details  Name: JAQUESHA BOTTGER MRN: [id number] Date of Birth: 07-Nov-1940   Medicare Observation Status Notification Given:  Yes    Ninfa Meeker, RN 06/14/2016, 3:59 PM

## 2016-06-14 NOTE — Progress Notes (Signed)
Per Dr. Maudie Mercury, discontinue all discharge orders, as pt is c/o Right foot pain.  MD has ordered xray of Right foot.

## 2016-06-14 NOTE — Telephone Encounter (Signed)
Walk In pt Form- asking for dates on FMLA to be changes-will give to [facility]. On Tuesday

## 2016-06-14 NOTE — Progress Notes (Addendum)
Inpatient Diabetes Program Recommendations  AACE/ADA: New Consensus Statement on Inpatient Glycemic Control (2015)  Target Ranges:  Prepandial:   less than 140 mg/dL      Peak postprandial:   less than 180 mg/dL (1-2 hours)      Critically ill patients:  140 - 180 mg/dL   Lab Results  Component Value Date   GLUCAP 188 (H) 06/14/2016    Review of Glycemic Control  Diabetes history: dm2 Outpatient Diabetes medications: Lantus 100 units in the morning and 50 units in the evening  NOTE: Spoke with patient and her sister at bedside regarding self management of DM.  Patient states that she only takes CBG's in am consistenetly and then spot checks CBG level in evenings, injects insulin in center of stomach (this RN reviewed rotating sites), stores insulin in refrigerator until begins to use (and then stores in her house at room temperature for up to 30 days), and notes both highs and lows routinely  This RN suggested that patient follow up with an Endocrinologist at this point to improve self management of DM.  Both patient and patient's sister agreed that this would benefit patient long term and will arrange an appointment.  Thank you,  Windy Carina, RN, MSN Diabetes Coordinator Inpatient Diabetes Program [phone] (Team Pager)

## 2016-06-14 NOTE — Discharge Instructions (Addendum)
Gout Gout is painful swelling that can occur in some of your joints. Gout is a type of arthritis. This condition is caused by having too much uric acid in your body. Uric acid is a chemical that forms when your body breaks down substances called purines. Purines are important for building body proteins. When your body has too much uric acid, sharp crystals can form and build up inside your joints. This causes pain and swelling. Gout attacks can happen quickly and be very painful (acute gout). Over time, the attacks can affect more joints and become more frequent (chronic gout). Gout can also cause uric acid to build up under your skin and inside your kidneys. What are the causes? This condition is caused by too much uric acid in your blood. This can occur because:  Your kidneys do not remove enough uric acid from your blood. This is the most common cause.  Your body makes too much uric acid. This can occur with some cancers and cancer treatments. It can also occur if your body is breaking down too many red blood cells (hemolytic anemia).  You eat too many foods that are high in purines. These foods include organ meats and some seafood. Alcohol, especially beer, is also high in purines. A gout attack may be triggered by trauma or stress. What increases the risk? This condition is more likely to develop in people who:  Have a family history of gout.  Are female and middle-aged.  Are female and have gone through menopause.  Are obese.  Frequently drink alcohol, especially beer.  Are dehydrated.  Lose weight too quickly.  Have an organ transplant.  Have lead poisoning.  Take certain medicines, including aspirin, cyclosporine, diuretics, levodopa, and niacin.  Have kidney disease or psoriasis. What are the signs or symptoms? An attack of acute gout happens quickly. It usually occurs in just one joint. The most common place is the big toe. Attacks often start at night. Other joints that  may be affected include joints of the feet, ankle, knee, fingers, wrist, or elbow. Symptoms may include:  Severe pain.  Warmth.  Swelling.  Stiffness.  Tenderness. The affected joint may be very painful to touch.  Shiny, red, or purple skin.  Chills and fever. Chronic gout may cause symptoms more frequently. More joints may be involved. You may also have white or yellow lumps (tophi) on your hands or feet or in other areas near your joints. How is this diagnosed? This condition is diagnosed based on your symptoms, medical history, and physical exam. You may have tests, such as:  Blood tests to measure uric acid levels.  Removal of joint fluid with a needle (aspiration) to look for uric acid crystals.  X-rays to look for joint damage. How is this treated? Treatment for this condition has two phases: treating an acute attack and preventing future attacks. Acute gout treatment may include medicines to reduce pain and swelling, including:  NSAIDs.  Steroids. These are strong anti-inflammatory medicines that can be taken by mouth (orally) or injected into a joint.  Colchicine. This medicine relieves pain and swelling when it is taken soon after an attack. It can be given orally or through an IV tube. Preventive treatment may include:  Daily use of smaller doses of NSAIDs or colchicine.  Use of a medicine that reduces uric acid levels in your blood.  Changes to your diet. You may need to see a specialist about healthy eating (dietitian). Follow these instructions at home:  During a Gout Attack   If directed, apply ice to the affected area:  Put ice in a plastic bag.  Place a towel between your skin and the bag.  Leave the ice on for 20 minutes, 2-3 times a day.  Rest the joint as much as possible. If the affected joint is in your leg, you may be given crutches to use.  Raise (elevate) the affected joint above the level of your heart as often as possible.  Drink enough  fluids to keep your urine clear or pale yellow.  Take over-the-counter and prescription medicines only as told by your health care provider.  Do not drive or operate heavy machinery while taking prescription pain medicine.  Follow instructions from your health care provider about eating or drinking restrictions.  Return to your normal activities as told by your health care provider. Ask your health care provider what activities are safe for you. Avoiding Future Gout Attacks   Follow a low-purine diet as told by your dietitian or health care provider. Avoid foods and drinks that are high in purines, including liver, kidney, anchovies, asparagus, herring, mushrooms, mussels, and beer.  Limit alcohol intake to no more than 1 drink a day for nonpregnant women and 2 drinks a day for men. One drink equals 12 oz of beer, 5 oz of wine, or 1 oz of hard liquor.  Maintain a healthy weight or lose weight if you are overweight. If you want to lose weight, talk with your health care provider. It is important that you do not lose weight too quickly.  Start or maintain an exercise program as told by your health care provider.  Drink enough fluids to keep your urine clear or pale yellow.  Take over-the-counter and prescription medicines only as told by your health care provider.  Keep all follow-up visits as told by your health care provider. This is important. Contact a health care provider if:  You have another gout attack.  You continue to have symptoms of a gout attack after10 days of treatment.  You have side effects from your medicines.  You have chills or a fever.  You have burning pain when you urinate.  You have pain in your lower back or belly. Get help right away if:  You have severe or uncontrolled pain.  You cannot urinate. This information is not intended to replace advice given to you by your health care provider. Make sure you discuss any questions you have with your health  care provider. Document Released: 03/26/2000 Document Revised: 09/04/2015 Document Reviewed: 01/09/2015 Elsevier Interactive Patient Education  2017 [place].   Please check your weight daily and please call your primary care physician if gain >3lbs , Please do not start taking Lasix until 06/15/2016.     Information on my medicine - ELIQUIS (apixaban)  This medication education was reviewed with me or my healthcare representative as part of my discharge preparation.  The pharmacist that spoke with me during my hospital stay was:  Saundra Shelling, [facility]  Why was Eliquis prescribed for you? Eliquis was prescribed for you to reduce the risk of a blood clot forming that can cause a stroke if you have a medical condition called atrial fibrillation (a type of irregular heartbeat).  What do You need to know about Eliquis ? Take your Eliquis TWICE DAILY - one tablet in the morning and one tablet in the evening with or without food. If you have difficulty swallowing the tablet whole please  discuss with your pharmacist how to take the medication safely.  Take Eliquis exactly as prescribed by your doctor and DO NOT stop taking Eliquis without talking to the doctor who prescribed the medication.  Stopping may increase your risk of developing a stroke.  Refill your prescription before you run out.  After discharge, you should have regular check-up appointments with your healthcare provider that is prescribing your Eliquis.  In the future your dose may need to be changed if your kidney function or weight changes by a significant amount or as you get older.  What do you do if you miss a dose? If you miss a dose, take it as soon as you remember on the same day and resume taking twice daily.  Do not take more than one dose of ELIQUIS at the same time to make up a missed dose.  Important Safety Information A possible side effect of Eliquis is bleeding. You should call your healthcare provider  right away if you experience any of the following: ? Bleeding from an injury or your nose that does not stop. ? Unusual colored urine (red or dark brown) or unusual colored stools (red or black). ? Unusual bruising for unknown reasons. ? A serious fall or if you hit your head (even if there is no bleeding).  Some medicines may interact with Eliquis and might increase your risk of bleeding or clotting while on Eliquis. To help avoid this, consult your healthcare provider or pharmacist prior to using any new prescription or non-prescription medications, including herbals, vitamins, non-steroidal anti-inflammatory drugs (NSAIDs) and supplements.  This website has more information on Eliquis (apixaban): http://www.eliquis.com/eliquis/home

## 2016-06-14 NOTE — Progress Notes (Signed)
Patient ID: Victoria Adams, female   DOB: 08-13-1940, 76 y.o.   MRN: [id number]    Pt scheduled for discharge but had complaint of right foot pain today ? Gout Started on colchicine  Xray showed degenerative changes   ARF on CRF Check cmp in am Restart on lasix today  If creatinine close to baseline , was holding due to ARF  Hx of CHF/ Check echo to assess any change in  EF  Restart on lasix

## 2016-06-14 NOTE — Discharge Summary (Addendum)
Victoria Adams, is a 76 y.o. female  DOB [birth year]  MRN [id number].  Admission date:  06/12/2016  Admitting Physician  Phillips Grout, MD  Discharge Date:  06/14/2016   Primary MD  Gar Ponto, MD  Recommendations for primary care physician for things to follow:   1.  ARF on CRF Baseline creatinine 1.6 Creatinine improved from 2.3 to 2 Hold lasix 1 more day  Hyponatremia secondary to dehydration and diuretics.  Sodium improved from 126=> 130,  Holding lasix ,   Can restart tomorrow at home dose,   Check bmp in 1 week  Hypokalemia Resolved,  Check bmp in 1 week  CHF (EF 20%) Resume diurectic starting tomorrow Check bmp in 1 week  Pafib Eliquis was on hold due to renal insufficiency Cont digoxin Check digoxin level in 1 week.   If GFR<30 on repeat bmp would consider change to coumadin  Dm2 Cont current medications  Trop elevation (no chest pain) secondary to renal insufficiency   Admission Diagnosis  Dehydration [E86.0] Hyponatremia [E87.1] Generalized weakness [R53.1] Type 2 diabetes mellitus with complication, unspecified long term insulin use status ([place]) [E11.8]   Discharge Diagnosis  Dehydration [E86.0] Hyponatremia [E87.1] Generalized weakness [R53.1] Type 2 diabetes mellitus with complication, unspecified long term insulin use status ([place]) [E11.8]    Principal Problem:   Hyponatremia Active Problems:   NICM (nonischemic cardiomyopathy) (HCC)   LBBB (left bundle branch block)   Atrial fibrillation (HCC)   Chronic systolic heart failure (HCC)   IDA (iron deficiency anemia)   CKD (chronic kidney disease), stage III   Flu-like symptoms   AKI (acute kidney injury) ([place])   Weakness generalized   Type 2 diabetes mellitus with complication (HCC)   Lactic acid acidosis      Past Medical History:  Diagnosis Date  . Allergic rhinitis   . 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 Atrial fibrillation ([place])   . Chronic low back pain    Secondary to DJD  . Chronic systolic heart failure (HCC)    a. echo (5/15):  mod LVH, EF 20-25%, diff HK, severe LAE, mild RAE  . Coronary atherosclerosis-non obstructive    LHC (5/15):  EF 40-45% global HK; long LAD 40-60%, ostial 1st major septal perf 80-90%, ostial CFX ?%, mid AVCFX extensive Ca2+, dist PDA 50%  . Depression   . Essential hypertension, benign   . Fatty liver disease, nonalcoholic   . GERD (gastroesophageal reflux disease)   . Iron deficiency anemia   . Mitral regurgitation   . NICM (nonischemic cardiomyopathy) ([place])    a. echo (5/15):  mod LVH, EF 20-25%, diff HK, severe LAE, mild RAE  . NSTEMI (non-ST elevated myocardial infarction) ([place]) may 2015   No CAD  . Obesity   . Osteopenia   . PONV (postoperative nausea and vomiting)   . Rosacea   . Type 2 diabetes mellitus ([place])   . Vitamin D deficiency     Past Surgical  History:  Procedure Laterality Date  . ABDOMINAL HYSTERECTOMY    . APPENDECTOMY    . BREAST BIOPSY     RIGHT  . CARDIAC CATHETERIZATION  2010   LAD 50%, CFX 30%, RCA 40%; EF by echo 25-35%  . CATARACT EXTRACTION Bilateral   . CHOLECYSTECTOMY    . COLONOSCOPY WITH PROPOFOL N/A 12/20/2013   Procedure: COLONOSCOPY WITH PROPOFOL;  Surgeon: Milus Banister, MD;  Location: [facility];  Service: Endoscopy;  Laterality: N/A;  . EP IMPLANTABLE DEVICE N/A 04/16/2016   Procedure: BiV ICD Insertion CRT-D;  Surgeon: Deboraha Sprang, MD;  Location: [facility];  Service: Cardiovascular;  Laterality: N/A;  . ESOPHAGOGASTRODUODENOSCOPY N/A 08/25/2013   Procedure: ESOPHAGOGASTRODUODENOSCOPY (EGD);  Surgeon: Milus Banister, MD;  Location: [facility];  Service: Endoscopy;  Laterality: N/A;  . LEFT HEART CATHETERIZATION WITH CORONARY ANGIOGRAM N/A 08/24/2013   Procedure: LEFT HEART CATHETERIZATION WITH CORONARY ANGIOGRAM;  Surgeon: Leonie Man, MD;  Location: [facility] CATH LAB;  Service:  Cardiovascular;  Laterality: N/A;  . ROTATOR CUFF REPAIR     RIGHT SHOULDER  . TONSILLECTOMY  yrs ago  . TOTAL ABDOMINAL HYSTERECTOMY W/ BILATERAL SALPINGOOPHORECTOMY     for dermoid tumor       HPI  from the history and physical done on the day of admission:     76 y.o.femalewith medical history significant of CHF EF 20%, recent AICD placed in jan 18, afib, CAD, DM, HTN comes in with over 4 days of flu like symptoms with body aches, nasal congestion and cough. She has not been eating or drinking very well for several days. Has progressively gotten more weak. Denies fevers but has chills sometimes. No n/v/d. No urinary symptoms. Pt thinks she has had the flu, has not seen a doctor about this. Pt has continued to take all her cardiac medications. Pt found to be in AKI with hyponatremia and dehydration and referred for admission for such.    Hospital Course:     Pt was hydrate with ns iv gently and her diuretics were held.  Digoxin was held, level 1.3   Pt was negative for the FLU.  Pt feels much better after hydration.  Her trop showed minimal elevation which is thought to be related to AKI.  Pt denies any cp, palp, sob, orthopnea, pnd.  Pt feels back to her baseline. Her creatinine improved, from 2.3 on admission to 2.13.   todays creatinine was ... Pt feels stable and will be discharged to home.  Follow UP  Follow-up Information    Gar Ponto, MD Follow up in 1 week(s).   Specialty:  Family Medicine Contact information: [place] [address] [phone]            Consults obtained -  none  Discharge Condition: stable  Diet and Activity recommendation: See Discharge Instructions below  Discharge Instructions   Please check weight daily and call pcp if weight gain >3lbs.       Discharge Medications     Allergies as of 06/14/2016      Reactions   Codeine Nausea And Vomiting   Tylox [oxycodone-acetaminophen]    Vision closing in & heart  palpitations.       Medication List    STOP taking these medications   metoprolol succinate 25 MG 24 hr tablet Commonly known as:  TOPROL-XL     TAKE these medications   CALCIUM 600 + D PO Take 1 tablet by mouth  2 (two) times daily.   carvedilol 25 MG tablet Commonly known as:  COREG Take 1 tablet (25 mg total) by mouth 2 (two) times daily with a meal.   digoxin 0.125 MG tablet Commonly known as:  LANOXIN Take 1 tablet (0.125 mg total) by mouth daily.   dimenhyDRINATE 50 MG tablet Commonly known as:  DRAMAMINE Take 50 mg by mouth every 8 (eight) hours as needed for nausea.   diphenhydrAMINE 25 MG tablet Commonly known as:  SOMINEX Take 25 mg by mouth at bedtime as needed for itching or sleep.   doxycycline 100 MG capsule Commonly known as:  VIBRAMYCIN Take 100-200 mg by mouth 2 (two) times daily as needed (rosacea).   ELIQUIS 5 MG Tabs tablet Generic drug:  apixaban TAKE (1) TABLET TWICE DAILY.   ferrous sulfate 325 (65 FE) MG tablet TAKE 1 TABLET ONCE DAILY WITH BREAKFAST.   furosemide 80 MG tablet Commonly known as:  LASIX Take 80 mg by mouth 2 (two) times daily.   insulin glargine 100 UNIT/ML injection Commonly known as:  LANTUS Inject 50-100 Units into the skin 2 (two) times daily. 100 units in the morning and 50 units in the evening   metroNIDAZOLE 0.75 % gel Commonly known as:  METROGEL Apply 1 application topically 2 (two) times daily as needed (rosacea).   mometasone 0.1 % lotion Commonly known as:  ELOCON Apply 1 application topically daily as needed (eczema). EAR ECZEMA   oxyCODONE-acetaminophen 10-325 MG tablet Commonly known as:  PERCOCET Take 1 tablet by mouth every 6 (six) hours as needed for pain.   polyethylene glycol packet Commonly known as:  MIRALAX / GLYCOLAX Take 17 g by mouth daily.   rosuvastatin 20 MG tablet Commonly known as:  CRESTOR TAKE 1 TABLET DAILY AT 6 PM   sacubitril-valsartan 24-26 MG Commonly known as:   ENTRESTO TAKE (1) TABLET TWICE DAILY.   spironolactone 25 MG tablet Commonly known as:  ALDACTONE Take 1 tablet (25 mg total) by mouth daily.   venlafaxine 75 MG tablet Commonly known as:  EFFEXOR Take 75 mg by mouth 2 (two) times daily.       Major procedures and Radiology Reports - PLEASE review detailed and final reports for all details, in brief -      Dg Chest 2 View  Result Date: 06/12/2016 CLINICAL DATA:  Cough for 3 days. EXAM: CHEST  2 VIEW COMPARISON:  Radiographs 04/17/2016 FINDINGS: Left-sided pacemaker remains in place, leads unchanged in position. Coronary artery calcifications versus stents. There is stable cardiomegaly from prior exam. No pulmonary edema, focal airspace disease or pleural fluid. No pneumothorax. Stable osseous structures. IMPRESSION: Stable cardiomegaly.  No acute abnormality. Electronically Signed   By: Jeb Levering M.D.   On: 06/12/2016 21:13    Micro Results     No results found for this or any previous visit (from the past 240 hour(s)).     Today   Subjective    Shawna Norrid today is feeling back to baseline,  Breathing well.  No chest pain.    no headache,no  abdominal pain,no new weakness tingling or numbness, feels much better wants to go home today.    Objective   Blood pressure (!) 135/51, pulse 82, temperature 97.7 F (36.5 C), temperature source Oral, resp. rate 18, height 5\' 7"  (1.702 m), weight 125.6 kg (276 lb 14.4 oz), SpO2 98 %.   Intake/Output Summary (Last 24 hours) at 06/14/16 0757 Last data filed at 06/13/16 1541  Gross per 24 hour  Intake                0 ml  Output              260 ml  Net             -260 ml    Exam Awake Alert, Oriented x 3, No new F.N deficits, Normal affect Victoria Adams.AT,PERRAL Supple Neck,No JVD, No cervical lymphadenopathy appriciated.  Symmetrical Chest wall movement, Good air movement bilaterally, CTAB RRR, S1, S2, 1/6 sem rusb,  No Parasternal Heave +ve B.Sounds, Abd Soft, Non  tender, No organomegaly appriciated, No rebound -guarding or rigidity. No Cyanosis, Clubbing or edema, No new Rash or bruise   Data Review   CBC w Diff: Lab Results  Component Value Date   WBC 11.4 (H) 06/14/2016   HGB 13.2 06/14/2016   HCT 40.0 06/14/2016   PLT 173 06/14/2016   LYMPHOPCT 13 06/12/2016   MONOPCT 12 06/12/2016   EOSPCT 0 06/12/2016   BASOPCT 0 06/12/2016    CMP: Lab Results  Component Value Date   NA 128 (L) 06/13/2016   NA 136 05/14/2016   K 3.5 06/13/2016   CL 91 (L) 06/13/2016   CO2 25 06/13/2016   BUN 45 (H) 06/13/2016   BUN 24 05/14/2016   CREATININE 2.13 (H) 06/13/2016   PROT 7.2 08/23/2013   ALBUMIN 3.2 (L) 08/23/2013   BILITOT 0.4 08/23/2013   ALKPHOS 84 08/23/2013   AST 31 08/23/2013   ALT 13 08/23/2013  .   Total Time in preparing paper work, data evaluation and todays exam - 45 minutes  Jani Gravel M.D on 06/14/2016 at 7:57 AM  [facility]   Office  [phone]

## 2016-06-15 ENCOUNTER — Observation Stay (HOSPITAL_BASED_OUTPATIENT_CLINIC_OR_DEPARTMENT_OTHER): Payer: Medicare Other

## 2016-06-15 DIAGNOSIS — N179 Acute kidney failure, unspecified: Secondary | ICD-10-CM | POA: Diagnosis not present

## 2016-06-15 DIAGNOSIS — E872 Acidosis: Secondary | ICD-10-CM | POA: Diagnosis present

## 2016-06-15 DIAGNOSIS — Z6841 Body Mass Index (BMI) 40.0 and over, adult: Secondary | ICD-10-CM | POA: Diagnosis not present

## 2016-06-15 DIAGNOSIS — I447 Left bundle-branch block, unspecified: Secondary | ICD-10-CM

## 2016-06-15 DIAGNOSIS — R531 Weakness: Secondary | ICD-10-CM | POA: Diagnosis not present

## 2016-06-15 DIAGNOSIS — I959 Hypotension, unspecified: Secondary | ICD-10-CM | POA: Diagnosis present

## 2016-06-15 DIAGNOSIS — K760 Fatty (change of) liver, not elsewhere classified: Secondary | ICD-10-CM | POA: Diagnosis present

## 2016-06-15 DIAGNOSIS — I428 Other cardiomyopathies: Secondary | ICD-10-CM

## 2016-06-15 DIAGNOSIS — R6889 Other general symptoms and signs: Secondary | ICD-10-CM | POA: Diagnosis not present

## 2016-06-15 DIAGNOSIS — I509 Heart failure, unspecified: Secondary | ICD-10-CM | POA: Diagnosis not present

## 2016-06-15 DIAGNOSIS — K5900 Constipation, unspecified: Secondary | ICD-10-CM | POA: Diagnosis present

## 2016-06-15 DIAGNOSIS — E860 Dehydration: Secondary | ICD-10-CM | POA: Diagnosis present

## 2016-06-15 DIAGNOSIS — E871 Hypo-osmolality and hyponatremia: Secondary | ICD-10-CM | POA: Diagnosis not present

## 2016-06-15 DIAGNOSIS — N183 Chronic kidney disease, stage 3 (moderate): Secondary | ICD-10-CM

## 2016-06-15 DIAGNOSIS — I248 Other forms of acute ischemic heart disease: Secondary | ICD-10-CM | POA: Diagnosis present

## 2016-06-15 DIAGNOSIS — F329 Major depressive disorder, single episode, unspecified: Secondary | ICD-10-CM | POA: Diagnosis present

## 2016-06-15 DIAGNOSIS — I429 Cardiomyopathy, unspecified: Secondary | ICD-10-CM | POA: Diagnosis present

## 2016-06-15 DIAGNOSIS — E1165 Type 2 diabetes mellitus with hyperglycemia: Secondary | ICD-10-CM | POA: Diagnosis present

## 2016-06-15 DIAGNOSIS — I5022 Chronic systolic (congestive) heart failure: Secondary | ICD-10-CM | POA: Diagnosis present

## 2016-06-15 DIAGNOSIS — E118 Type 2 diabetes mellitus with unspecified complications: Secondary | ICD-10-CM | POA: Diagnosis not present

## 2016-06-15 DIAGNOSIS — I2510 Atherosclerotic heart disease of native coronary artery without angina pectoris: Secondary | ICD-10-CM | POA: Diagnosis present

## 2016-06-15 DIAGNOSIS — I481 Persistent atrial fibrillation: Secondary | ICD-10-CM

## 2016-06-15 DIAGNOSIS — K219 Gastro-esophageal reflux disease without esophagitis: Secondary | ICD-10-CM | POA: Diagnosis present

## 2016-06-15 DIAGNOSIS — I480 Paroxysmal atrial fibrillation: Secondary | ICD-10-CM | POA: Diagnosis present

## 2016-06-15 DIAGNOSIS — M545 Low back pain: Secondary | ICD-10-CM | POA: Diagnosis present

## 2016-06-15 DIAGNOSIS — E559 Vitamin D deficiency, unspecified: Secondary | ICD-10-CM | POA: Diagnosis present

## 2016-06-15 DIAGNOSIS — I130 Hypertensive heart and chronic kidney disease with heart failure and stage 1 through stage 4 chronic kidney disease, or unspecified chronic kidney disease: Secondary | ICD-10-CM | POA: Diagnosis present

## 2016-06-15 DIAGNOSIS — G8929 Other chronic pain: Secondary | ICD-10-CM | POA: Diagnosis present

## 2016-06-15 DIAGNOSIS — E1122 Type 2 diabetes mellitus with diabetic chronic kidney disease: Secondary | ICD-10-CM | POA: Diagnosis present

## 2016-06-15 LAB — COMPREHENSIVE METABOLIC PANEL
ALBUMIN: 2.4 g/dL — AB (ref 3.5–5.0)
ALK PHOS: 81 U/L (ref 38–126)
ALT: 15 U/L (ref 14–54)
AST: 27 U/L (ref 15–41)
Anion gap: 10 (ref 5–15)
BILIRUBIN TOTAL: 0.7 mg/dL (ref 0.3–1.2)
BUN: 31 mg/dL — ABNORMAL HIGH (ref 6–20)
CALCIUM: 8.5 mg/dL — AB (ref 8.9–10.3)
CO2: 28 mmol/L (ref 22–32)
Chloride: 92 mmol/L — ABNORMAL LOW (ref 101–111)
Creatinine, Ser: 1.75 mg/dL — ABNORMAL HIGH (ref 0.44–1.00)
GFR calc Af Amer: 32 mL/min — ABNORMAL LOW (ref >60)
GFR calc non Af Amer: 28 mL/min — ABNORMAL LOW (ref >60)
GLUCOSE: 321 mg/dL — AB (ref 65–99)
Potassium: 3.8 mmol/L (ref 3.5–5.1)
Sodium: 130 mmol/L — ABNORMAL LOW (ref 135–145)
TOTAL PROTEIN: 6.3 g/dL — AB (ref 6.5–8.1)

## 2016-06-15 LAB — ECHOCARDIOGRAM COMPLETE
HEIGHTINCHES: 67 in
Weight: 4430.4 oz

## 2016-06-15 LAB — GLUCOSE, CAPILLARY
GLUCOSE-CAPILLARY: 341 mg/dL — AB (ref 65–99)
GLUCOSE-CAPILLARY: 391 mg/dL — AB (ref 65–99)
GLUCOSE-CAPILLARY: 377 mg/dL — AB (ref 65–99)
GLUCOSE-CAPILLARY: 421 mg/dL — AB (ref 65–99)
Glucose-Capillary: 310 mg/dL — ABNORMAL HIGH (ref 65–99)

## 2016-06-15 LAB — CBC
HEMATOCRIT: 38.8 % (ref 36.0–46.0)
HEMOGLOBIN: 12.6 g/dL (ref 12.0–15.0)
MCH: 29 pg (ref 26.0–34.0)
MCHC: 32.5 g/dL (ref 30.0–36.0)
MCV: 89.2 fL (ref 78.0–100.0)
Platelets: 181 10*3/uL (ref 150–400)
RBC: 4.35 MIL/uL (ref 3.87–5.11)
RDW: 13.1 % (ref 11.5–15.5)
WBC: 10.2 10*3/uL (ref 4.0–10.5)

## 2016-06-15 LAB — URIC ACID: Uric Acid, Serum: 9.5 mg/dL — ABNORMAL HIGH (ref 2.3–6.6)

## 2016-06-15 MED ORDER — PERFLUTREN LIPID MICROSPHERE
1.0000 mL | INTRAVENOUS | Status: AC | PRN
  Administered 2016-06-15: 3 mL via INTRAVENOUS
  Filled 2016-06-15: qty 10

## 2016-06-15 MED ORDER — POTASSIUM CHLORIDE CRYS ER 20 MEQ PO TBCR
40.0000 meq | EXTENDED_RELEASE_TABLET | Freq: Once | ORAL | Status: AC
  Administered 2016-06-15: 40 meq via ORAL
  Filled 2016-06-15: qty 2

## 2016-06-15 MED ORDER — APIXABAN 5 MG PO TABS
5.0000 mg | ORAL_TABLET | Freq: Two times a day (BID) | ORAL | Status: DC
  Administered 2016-06-15 – 2016-06-16 (×3): 5 mg via ORAL
  Filled 2016-06-15 (×3): qty 1

## 2016-06-15 MED ORDER — INSULIN GLARGINE 100 UNIT/ML ~~LOC~~ SOLN
100.0000 [IU] | Freq: Every day | SUBCUTANEOUS | Status: DC
  Administered 2016-06-15 – 2016-06-16 (×2): 100 [IU] via SUBCUTANEOUS
  Filled 2016-06-15 (×2): qty 1

## 2016-06-15 MED ORDER — SACUBITRIL-VALSARTAN 24-26 MG PO TABS
1.0000 | ORAL_TABLET | Freq: Two times a day (BID) | ORAL | Status: DC
  Administered 2016-06-15 – 2016-06-16 (×3): 1 via ORAL
  Filled 2016-06-15 (×4): qty 1

## 2016-06-15 MED ORDER — METOPROLOL SUCCINATE ER 25 MG PO TB24
25.0000 mg | ORAL_TABLET | Freq: Every day | ORAL | Status: DC
  Administered 2016-06-15 – 2016-06-16 (×2): 25 mg via ORAL
  Filled 2016-06-15 (×2): qty 1

## 2016-06-15 MED ORDER — INSULIN ASPART 100 UNIT/ML ~~LOC~~ SOLN
8.0000 [IU] | Freq: Once | SUBCUTANEOUS | Status: AC
  Administered 2016-06-15: 8 [IU] via SUBCUTANEOUS

## 2016-06-15 MED ORDER — INSULIN GLARGINE 100 UNIT/ML ~~LOC~~ SOLN
50.0000 [IU] | Freq: Every day | SUBCUTANEOUS | Status: DC
  Administered 2016-06-15: 50 [IU] via SUBCUTANEOUS
  Filled 2016-06-15 (×2): qty 0.5

## 2016-06-15 MED ORDER — METHYLPREDNISOLONE SODIUM SUCC 125 MG IJ SOLR
80.0000 mg | Freq: Three times a day (TID) | INTRAMUSCULAR | Status: DC
  Administered 2016-06-15 – 2016-06-16 (×3): 80 mg via INTRAVENOUS
  Filled 2016-06-15 (×3): qty 2

## 2016-06-15 NOTE — Progress Notes (Signed)
Patient ID: Victoria Adams, female   DOB: 08/05/1940, 76 y.o.   MRN: [id number]                                                                PROGRESS NOTE                                                                                                                                                                                                             Patient Demographics:    Victoria Adams, is a 76 y.o. female, DOB - [birth year], [id number] Admit date - 06/12/2016   Admitting Physician Phillips Grout, MD Outpatient Primary MD for the patient is Gar Ponto, MD  LOS - 0  Outpatient Specialists:    Chief Complaint  Patient presents with  . Cough  . Weakness       Brief Narrative  76 y.o. female with medical history significant of CHF EF 20%, recent AICD placed in jan 18, afib, CAD, DM, HTN comes in with over 4 days of flu like symptoms with body aches, nasal congestion and cough.  She has not been eating or drinking very well for several days.  Has progressively gotten more weak.  Denies fevers but has chills sometimes.  No n/v/d.  No urinary symptoms.  Pt thinks she has had the flu, has not seen a doctor about this.  Pt has continued to take all her cardiac medications.  Pt found to be in AKI with hyponatremia and dehydration and referred for admission for such.    Subjective:   Still complaining about right foot pain, couldn't stand up and ambulate because of foot pain.   Assessment  & Plan :    Principal Problem:   Hyponatremia Active Problems:   NICM (nonischemic cardiomyopathy) (HCC)   LBBB (left bundle branch block)   Atrial fibrillation (HCC)   Chronic systolic heart failure (HCC)   IDA (iron deficiency anemia)   CKD (chronic kidney disease), stage III   Flu-like symptoms   AKI (acute kidney injury) (HCC)   Weakness generalized   Type 2 diabetes mellitus with complication (HCC)   Lactic acid acidosis   ARF on CRF Baseline creatinine 1.6, Presented with  creatinine of 2.3 Received gentle IV fluid hydration with normal saline at 75, this is discontinued  now, back on her Lasix.  Right foot pain Patient has right foot with his some warmth and no significant redness. Denies any previous history of gouty arthritis, but suspicious for gout. Started on colchicine, I have added Solu-Medrol, cannot add NSAIDs because of kidney function. Follow uric acid if it's elevated needs allopurinol on discharge.  Hyponatremia secondary to dehydration and diuretics.  Presented with sodium of 126, currently sodium is 130. Started on Lasix, follow sodium in a.m.  Hypokalemia Potassium was down to 3.3, replaced with oral supplements  Hypotension Resolved after a few fluid hydration  CHF (EF 20%) Has LVEF of 20% with LBBB, status post BiV ICD implant She is on Entresto, Aldactone, digoxin, Coreg and Lasix.  Pafib Eliquis restarted, CHADSVASC score 4 (CHF, HTN, age, F sex).  Restart on digoxin  Diabetes mellitus type 2 Blood sugar is elevated above 300, restart Lantus insulin. She will have more elevation of her blood sugar as Solu-Medrol started on 3/6.  Leukocytosis Repeat cbc, check ua  Trop elevation Repeat trop in am  Code Status : FULL CODE  Family Communication  : w patient  Disposition Plan  : home  Barriers For Discharge : Unable to ambulate because of right foot pain  Consults  :  none  Procedures  :   DVT Prophylaxis  :   SCDs   Lab Results  Component Value Date   PLT 181 06/15/2016    Antibiotics  :    Anti-infectives    None        Objective:   Vitals:   06/14/16 0647 06/14/16 1500 06/14/16 2100 06/15/16 0554  BP:  (!) 115/46 (!) 105/46 (!) 138/42  Pulse:  76 78 73  Resp:  18  18  Temp:  97.1 F (36.2 C) 98.4 F (36.9 C) 98.1 F (36.7 C)  TempSrc:  Oral Oral Oral  SpO2:  99% 94% 94%  Weight: 125.6 kg (276 lb 14.4 oz)     Height:        Wt Readings from Last 3 Encounters:  06/14/16 125.6 kg (276 lb  14.4 oz)  05/06/16 127.5 kg (281 lb)  04/17/16 127.1 kg (280 lb 1.6 oz)     Intake/Output Summary (Last 24 hours) at 06/15/16 1049 Last data filed at 06/15/16 0900  Gross per 24 hour  Intake              700 ml  Output              210 ml  Net              490 ml     Physical Exam  Awake Alert, Oriented X 3, No new F.N deficits, Normal affect [place].AT,PERRAL Supple Neck,No JVD, No cervical lymphadenopathy appriciated.  Symmetrical Chest wall movement, Good air movement bilaterally, CTAB RRR,No Gallops,Rubs or new Murmurs, No Parasternal Heave +ve B.Sounds, Abd Soft, No tenderness, No organomegaly appriciated, No rebound - guarding or rigidity. No Cyanosis, Clubbing or edema, No new Rash or bruise      Data Review:    CBC  Recent Labs Lab 06/12/16 1958 06/12/16 2230 06/13/16 0940 06/14/16 0626 06/15/16 0540  WBC 16.0*  --  12.5* 11.4* 10.2  HGB 14.1 14.3 13.1 13.2 12.6  HCT 41.3 42.0 39.6 40.0 38.8  PLT 181  --  162 173 181  MCV 87.7  --  88.6 89.1 89.2  MCH 29.9  --  29.3 29.4 29.0  MCHC 34.1  --  33.1 33.0 32.5  RDW 12.7  --  12.9 12.9 13.1  LYMPHSABS 2.1  --   --   --   --   MONOABS 1.9*  --   --   --   --   EOSABS 0.1  --   --   --   --   BASOSABS 0.0  --   --   --   --     Chemistries   Recent Labs Lab 06/12/16 1955 06/12/16 2230 06/13/16 0940 06/14/16 0626 06/15/16 0540  NA 126* 125* 128* 130* 130*  K 3.4* 3.3* 3.5 3.5 3.8  CL 84* 84* 91* 93* 92*  CO2  --   --  25 31 28   GLUCOSE 348* 315* 144* 185* 321*  BUN 46* 46* 45* 38* 31*  CREATININE 2.30* 2.30* 2.13* 2.04* 1.75*  CALCIUM  --   --  7.8* 8.0* 8.5*  MG  --   --  1.9  --   --   AST  --   --   --  27 27  ALT  --   --   --  16 15  ALKPHOS  --   --   --  71 81  BILITOT  --   --   --  0.6 0.7   ------------------------------------------------------------------------------------------------------------------ No results for input(s): CHOL, HDL, LDLCALC, TRIG, CHOLHDL, LDLDIRECT in the last 72  hours.  Lab Results  Component Value Date   HGBA1C 12.6 (H) 06/14/2016   ------------------------------------------------------------------------------------------------------------------ No results for input(s): TSH, T4TOTAL, T3FREE, THYROIDAB in the last 72 hours.  Invalid input(s): FREET3 ------------------------------------------------------------------------------------------------------------------ No results for input(s): VITAMINB12, FOLATE, FERRITIN, TIBC, IRON, RETICCTPCT in the last 72 hours.  Coagulation profile No results for input(s): INR, PROTIME in the last 168 hours.  No results for input(s): DDIMER in the last 72 hours.  Cardiac Enzymes  Recent Labs Lab 06/13/16 0940 06/14/16 0626  CKMB  --  9.9*  TROPONINI 0.23* 0.18*   ------------------------------------------------------------------------------------------------------------------    Component Value Date/Time   BNP 157.3 (H) 05/06/2016 1155    Inpatient Medications  Scheduled Meds: . colchicine  0.6 mg Oral Daily  . digoxin  0.125 mg Oral Daily  . insulin aspart  0-5 Units Subcutaneous QHS  . insulin aspart  0-9 Units Subcutaneous TID WC  . methylPREDNISolone (SOLU-MEDROL) injection  80 mg Intravenous Q8H   Continuous Infusions: PRN Meds:.guaiFENesin-dextromethorphan, ondansetron **OR** ondansetron (ZOFRAN) IV, oxyCODONE, phenol  Micro Results No results found for this or any previous visit (from the past 240 hour(s)).  Radiology Reports Dg Chest 2 View  Result Date: 06/12/2016 CLINICAL DATA:  Cough for 3 days. EXAM: CHEST  2 VIEW COMPARISON:  Radiographs 04/17/2016 FINDINGS: Left-sided pacemaker remains in place, leads unchanged in position. Coronary artery calcifications versus stents. There is stable cardiomegaly from prior exam. No pulmonary edema, focal airspace disease or pleural fluid. No pneumothorax. Stable osseous structures. IMPRESSION: Stable cardiomegaly.  No acute abnormality.  Electronically Signed   By: Jeb Levering M.D.   On: 06/12/2016 21:13   Dg Foot 2 Views Right  Result Date: 06/14/2016 CLINICAL DATA:  Diffuse metatarsal pain, initial encounter EXAM: RIGHT FOOT - 2 VIEW COMPARISON:  None. FINDINGS: Degenerative changes of the first MTP joint are seen. No acute fracture or dislocation is noted. Mild tarsal degenerative changes are seen. Small calcaneal spurs are noted. Vascular calcifications are noted. IMPRESSION: Degenerative change without acute abnormality. Electronically Signed   By: Inez Catalina M.D.   On: 06/14/2016 16:25  Time Spent in minutes  [place].D on 06/15/2016 at 10:49 AM  Between 7am to 7pm - Pager - [phone]  After 7pm go to [email or web address] - password [email or web address]  [facility] -  Office  [phone]

## 2016-06-15 NOTE — Progress Notes (Signed)
  Echocardiogram 2D Echocardiogram with Definity has been performed.  Darlina Sicilian M 06/15/2016, 12:24 PM

## 2016-06-15 NOTE — Progress Notes (Signed)
Inpatient Diabetes Program Recommendations  AACE/ADA: New Consensus Statement on Inpatient Glycemic Control (2015)  Target Ranges:  Prepandial:   less than 140 mg/dL      Peak postprandial:   less than 180 mg/dL (1-2 hours)      Critically ill patients:  140 - 180 mg/dL   Results for Victoria Adams, Victoria Adams (MRN [id number]) as of 06/15/2016 08:30  Ref. Range 06/14/2016 06:06 06/14/2016 12:11 06/14/2016 16:49 06/14/2016 20:41  Glucose-Capillary Latest Ref Range: 65 - 99 mg/dL 188 (H) 270 (H) 310 (H) 323 (H)   Results for Victoria Adams, Victoria Adams (MRN [id number]) as of 06/15/2016 08:30  Ref. Range 06/15/2016 06:27  Glucose-Capillary Latest Ref Range: 65 - 99 mg/dL 310 (H)    Admit with: Hyponatremia  History: DM, CHF  Home DM Meds: Lantus 100 units AM/ 50 units PM  Current Insulin Orders: Novolog Sensitive Correction Scale/ SSI (0-9 units) TID AC + HS       MD- Patient takes large doses of Lantus at home.  CBG 310 mg/dl this AM.  Please consider starting Lantus 50 units AM/ Lantus 25 units QHS (this would be 50% total home dose)      --Will follow patient during hospitalization--  Wyn Quaker RN, MSN, CDE Diabetes Coordinator Inpatient Glycemic Control Team Team Pager: [phone] (8a-5p)

## 2016-06-15 NOTE — Plan of Care (Signed)
Problem: Skin Integrity: Goal: Risk for impaired skin integrity will decrease Outcome: Progressing No skin issues noted  Problem: Tissue Perfusion: Goal: Risk factors for ineffective tissue perfusion will decrease Outcome: Progressing No s/s of dvt, scds are on  Problem: Activity: Goal: Risk for activity intolerance will decrease Outcome: Progressing Tolerates activities well  Problem: Bowel/Gastric: Goal: Will not experience complications related to bowel motility Outcome: Progressing No gastric or bowel issues reported

## 2016-06-16 LAB — RENAL FUNCTION PANEL
ALBUMIN: 2.5 g/dL — AB (ref 3.5–5.0)
ANION GAP: 10 (ref 5–15)
BUN: 29 mg/dL — AB (ref 6–20)
CALCIUM: 8.7 mg/dL — AB (ref 8.9–10.3)
CO2: 28 mmol/L (ref 22–32)
Chloride: 91 mmol/L — ABNORMAL LOW (ref 101–111)
Creatinine, Ser: 1.59 mg/dL — ABNORMAL HIGH (ref 0.44–1.00)
GFR calc Af Amer: 36 mL/min — ABNORMAL LOW (ref >60)
GFR, EST NON AFRICAN AMERICAN: 31 mL/min — AB (ref >60)
Glucose, Bld: 437 mg/dL — ABNORMAL HIGH (ref 65–99)
PHOSPHORUS: 1.5 mg/dL — AB (ref 2.5–4.6)
Potassium: 5.1 mmol/L (ref 3.5–5.1)
SODIUM: 129 mmol/L — AB (ref 135–145)

## 2016-06-16 LAB — CBC
HCT: 40 % (ref 36.0–46.0)
HEMOGLOBIN: 13.3 g/dL (ref 12.0–15.0)
MCH: 29.7 pg (ref 26.0–34.0)
MCHC: 33.3 g/dL (ref 30.0–36.0)
MCV: 89.3 fL (ref 78.0–100.0)
Platelets: 195 10*3/uL (ref 150–400)
RBC: 4.48 MIL/uL (ref 3.87–5.11)
RDW: 13.1 % (ref 11.5–15.5)
WBC: 10.3 10*3/uL (ref 4.0–10.5)

## 2016-06-16 LAB — GLUCOSE, CAPILLARY
GLUCOSE-CAPILLARY: 354 mg/dL — AB (ref 65–99)
GLUCOSE-CAPILLARY: 470 mg/dL — AB (ref 65–99)
GLUCOSE-CAPILLARY: 444 mg/dL — AB (ref 65–99)

## 2016-06-16 MED ORDER — PREDNISONE 20 MG PO TABS: ORAL_TABLET | ORAL | 0 refills | Status: DC

## 2016-06-16 MED ORDER — PREDNISONE 20 MG PO TABS
60.0000 mg | ORAL_TABLET | Freq: Every day | ORAL | Status: DC
  Administered 2016-06-16: 60 mg via ORAL
  Filled 2016-06-16: qty 3

## 2016-06-16 MED ORDER — INSULIN GLARGINE 100 UNIT/ML ~~LOC~~ SOLN: 60.0000 [IU] | Freq: Two times a day (BID) | SUBCUTANEOUS | 11 refills | Status: DC

## 2016-06-16 MED ORDER — COLCHICINE 0.6 MG PO TABS: 0.6000 mg | ORAL_TABLET | Freq: Every day | ORAL | 0 refills | Status: DC

## 2016-06-16 MED ORDER — INSULIN ASPART 100 UNIT/ML ~~LOC~~ SOLN
15.0000 [IU] | Freq: Once | SUBCUTANEOUS | Status: AC
  Administered 2016-06-16: 15 [IU] via SUBCUTANEOUS

## 2016-06-16 MED ORDER — INSULIN ASPART 100 UNIT/ML ~~LOC~~ SOLN
20.0000 [IU] | Freq: Once | SUBCUTANEOUS | Status: AC
  Administered 2016-06-16: 20 [IU] via SUBCUTANEOUS

## 2016-06-16 NOTE — Progress Notes (Signed)
Inpatient Diabetes Program Recommendations  AACE/ADA: New Consensus Statement on Inpatient Glycemic Control (2015)  Target Ranges:  Prepandial:   less than 140 mg/dL      Peak postprandial:   less than 180 mg/dL (1-2 hours)      Critically ill patients:  140 - 180 mg/dL   Lab Results  Component Value Date   GLUCAP 354 (H) 06/16/2016   HGBA1C 12.6 (H) 06/14/2016   Results for LUNAH, LOSASSO (MRN [id number]) as of 06/16/2016 10:02  Ref. Range 06/15/2016 11:33 06/15/2016 16:48 06/15/2016 21:12 06/15/2016 22:05 06/16/2016 06:32  Glucose-Capillary Latest Ref Range: 65 - 99 mg/dL 341 (H) 391 (H) 377 (H) 421 (H) 354 (H)    Inpatient Diabetes Program Recommendations:   Insulin - Basal: Noted increase in basal dosages to home regimen.  Insulin - Meal Coverage: Please consider Novolog 7 units TID AC if eats > 50% of meal.  Thank you,  Windy Carina, RN, MSN Diabetes Coordinator Inpatient Diabetes Program [phone] (Team Pager)

## 2016-06-16 NOTE — Discharge Summary (Signed)
[facility]  Physician Discharge Summary   Patient ID: Victoria Adams MRN: [id number] DOB/AGE: 74-Jul-1942 74 y.o.  Admit date: 06/12/2016 Discharge date: 06/16/2016  PCP: Gar Ponto, MD  DISCHARGE DIAGNOSES:  Principal Problem:   Hyponatremia Active Problems:   NICM (nonischemic cardiomyopathy) (HCC)   LBBB (left bundle branch block)   Atrial fibrillation (HCC)   Chronic systolic heart failure (HCC)   IDA (iron deficiency anemia)   CKD (chronic kidney disease), stage III   Flu-like symptoms   AKI (acute kidney injury) ([place])   Weakness generalized   Type 2 diabetes mellitus with complication (HCC)   Lactic acid acidosis   RECOMMENDATIONS FOR OUTPATIENT FOLLOW UP: 1. Needs definitive treatment for gout 2. May need further adjustment to her insulin regimen   DISCHARGE CONDITION: fair  Diet recommendation: Modified carbohydrate  Filed Weights   06/14/16 0500 06/14/16 0647 06/16/16 0608  Weight: 125.2 kg (276 lb) 125.6 kg (276 lb 14.4 oz) 122.3 kg (269 lb 9.6 oz)    INITIAL HISTORY: 76 y.o.femalewith medical history significant of CHF EF 20%, recent AICD placed in jan 18, afib, CAD, DM, HTN , presented with over 4 days of flu like symptoms with body aches, nasal congestion and cough. Pt found to be in AKI with hyponatremia and dehydration and referred for admission for same.  Consultations:  None  Procedures: Transthoracic echocardiogram Study Conclusions  - Procedure narrative: Transthoracic echocardiography. Image   quality was poor. The study was technically difficult, as a   result of restricted patient mobility. Intravenous contrast   (Definity) was administered. - Left ventricle: The cavity size was normal. Wall thickness was   increased in a pattern of mild LVH. Systolic function was normal.   The estimated ejection fraction was in the range of 60% to 65%.   Incoordinate - paced septal motion. The study is not technically   sufficient to  allow evaluation of LV diastolic function. - Aorta: The aorta was mildly calcified. - Mitral valve: Calcified annulus. Mildly thickened leaflets .   There was trivial regurgitation. - Right ventricle: The cavity size was mildly dilated.  Impressions:  - Compared to a recent study in 02/2016, the LVEF has improved from   20-25% up to 60-65%. Pacemaker or AICD wires are now noted.  HOSPITAL COURSE:   ARF on CRF Baseline creatinine 1.6, Presented with creatinine of 2.3. Received gentle IV fluid hydration with improvement in renal function.  Right foot pain/acute gout Patient has right foot with his some warmth and no significant redness. Denies any previous history of gouty arthritis, but suspicious for gout. Uric acid level elevated at 9.5. Patient was started on colchicine. Due to chronic renal disease. She could not be given NSAIDs. She was given steroids. Symptoms have significantly improved. Steroids will be tapered on. Continue colchicine. She will need to discuss initiating definitive treatments such as allopurinol with her primary care provider.  Hyponatremia secondary to dehydration and diuretics.  Presented with sodium of 126. Sodium level remains low but stable.  Hypokalemia Potassium was down to 3.3, replaced with oral supplements  Hypotension Resolved after a few fluid hydration  Chronic systolic CHF (EF 39%) Patient previously was noted to have LVEF of 20% with LBBB, status post BiV ICD implant. She is on Entresto, Aldactone, digoxin, Coreg and Lasix. Echocardiogram was repeated and shows improvement in systolic function.  Paroxysmal Continue Eliquis, CHADSVASC score 4 (CHF, HTN, age, F sex). Restarted on digoxin  Diabetes mellitus type 2 Patient had poorly  controlled diabetes. Her HbA1c is 12. She tells me that her sugars usually run about 200. With steroids CBGs climbed up significantly. Steroids being tapered down now. Patient was reassured. She may also  benefit from increased dose of Lantus. Nighttime dose will be increased. She has been asked to check her CBGs regularly for the next few days and maintain a log for her primary care provider.  Mild elevation in troponin, likely due to demand ischemia and renal failure.  Overall, stable. Able to ambulate without any difficulty. She feels like she is at baseline level of functioning. Does not feel like she needs PT evaluation. Okay for discharge.   PERTINENT LABS:  The results of significant diagnostics from this hospitalization (including imaging, microbiology, ancillary and laboratory) are listed below for reference.      Labs: Basic Metabolic Panel:  Recent Labs Lab 06/12/16 2230 06/13/16 0940 06/14/16 0626 06/15/16 0540 06/15/16 2345  NA 125* 128* 130* 130* 129*  K 3.3* 3.5 3.5 3.8 5.1  CL 84* 91* 93* 92* 91*  CO2  --  25 31 28 28   GLUCOSE 315* 144* 185* 321* 437*  BUN 46* 45* 38* 31* 29*  CREATININE 2.30* 2.13* 2.04* 1.75* 1.59*  CALCIUM  --  7.8* 8.0* 8.5* 8.7*  MG  --  1.9  --   --   --   PHOS  --   --   --   --  1.5*   Liver Function Tests:  Recent Labs Lab 06/14/16 0626 06/15/16 0540 06/15/16 2345  AST 27 27  --   ALT 16 15  --   ALKPHOS 71 81  --   BILITOT 0.6 0.7  --   PROT 6.2* 6.3*  --   ALBUMIN 2.4* 2.4* 2.5*   CBC:  Recent Labs Lab 06/12/16 1958 06/12/16 2230 06/13/16 0940 06/14/16 0626 06/15/16 0540 06/15/16 2345  WBC 16.0*  --  12.5* 11.4* 10.2 10.3  NEUTROABS 12.0*  --   --   --   --   --   HGB 14.1 14.3 13.1 13.2 12.6 13.3  HCT 41.3 42.0 39.6 40.0 38.8 40.0  MCV 87.7  --  88.6 89.1 89.2 89.3  PLT 181  --  162 173 181 195   Cardiac Enzymes:  Recent Labs Lab 06/13/16 0940 06/14/16 0626  CKTOTAL  --  476*  CKMB  --  9.9*  TROPONINI 0.23* 0.18*   BNP: BNP (last 3 results)  Recent Labs  02/04/16 1430 02/19/16 1644 05/06/16 1155  BNP 109.3* 136.0* 157.3*    CBG:  Recent Labs Lab 06/15/16 2112 06/15/16 2205  06/16/16 0632 06/16/16 1127 06/16/16 1517  GLUCAP 377* 421* 354* 470* 444*     IMAGING STUDIES Dg Chest 2 View  Result Date: 06/12/2016 CLINICAL DATA:  Cough for 3 days. EXAM: CHEST  2 VIEW COMPARISON:  Radiographs 04/17/2016 FINDINGS: Left-sided pacemaker remains in place, leads unchanged in position. Coronary artery calcifications versus stents. There is stable cardiomegaly from prior exam. No pulmonary edema, focal airspace disease or pleural fluid. No pneumothorax. Stable osseous structures. IMPRESSION: Stable cardiomegaly.  No acute abnormality. Electronically Signed   By: Jeb Levering M.D.   On: 06/12/2016 21:13   Dg Foot 2 Views Right  Result Date: 06/14/2016 CLINICAL DATA:  Diffuse metatarsal pain, initial encounter EXAM: RIGHT FOOT - 2 VIEW COMPARISON:  None. FINDINGS: Degenerative changes of the first MTP joint are seen. No acute fracture or dislocation is noted. Mild tarsal degenerative changes are seen.  Small calcaneal spurs are noted. Vascular calcifications are noted. IMPRESSION: Degenerative change without acute abnormality. Electronically Signed   By: Inez Catalina M.D.   On: 06/14/2016 16:25    DISCHARGE EXAMINATION: Vitals:   06/15/16 1500 06/15/16 2100 06/16/16 0608 06/16/16 1520  BP: (!) 138/50 (!) 124/37 (!) 135/44 (!) 112/44  Pulse: 77 67 72 71  Resp: 18  16 17   Temp: 98.7 F (37.1 C) 98.5 F (36.9 C) 97.8 F (36.6 C) 98.5 F (36.9 C)  TempSrc: Oral Oral Oral Oral  SpO2: 96% 91% 94% 98%  Weight:   122.3 kg (269 lb 9.6 oz)   Height:       General appearance: alert, cooperative, appears stated age, no distress and moderately obese Resp: clear to auscultation bilaterally Cardio: regular rate and rhythm, S1, S2 normal, no murmur, click, rub or gallop GI: soft, non-tender; bowel sounds normal; no masses,  no organomegaly Extremities: Good range of motion of the right foot and ankle.  DISPOSITION: Home  Discharge Instructions    Call MD for:  difficulty  breathing, headache or visual disturbances    Complete by:  As directed    Call MD for:  extreme fatigue    Complete by:  As directed    Call MD for:  persistant dizziness or light-headedness    Complete by:  As directed    Call MD for:  persistant nausea and vomiting    Complete by:  As directed    Call MD for:  severe uncontrolled pain    Complete by:  As directed    Call MD for:  temperature >100.4    Complete by:  As directed    Diet Carb Modified    Complete by:  As directed    Discharge instructions    Complete by:  As directed    Please follow-up with your primary care provider to discuss definitive treatment for gout including medication such as allopurinol. Please check your glucose levels at least 3 times a day for the next few days and maintain a log for your primary care provider.  You were cared for by a hospitalist during your hospital stay. If you have any questions about your discharge medications or the care you received while you were in the hospital after you are discharged, you can call the unit and asked to speak with the hospitalist on call if the hospitalist that took care of you is not available. Once you are discharged, your primary care physician will handle any further medical issues. Please note that NO REFILLS for any discharge medications will be authorized once you are discharged, as it is imperative that you return to your primary care physician (or establish a relationship with a primary care physician if you do not have one) for your aftercare needs so that they can reassess your need for medications and monitor your lab values. If you do not have a primary care physician, you can call [phone] for a physician referral.   Increase activity slowly    Complete by:  As directed       ALLERGIES:  Allergies  Allergen Reactions  . Codeine Nausea And Vomiting  . Tylox [Oxycodone-Acetaminophen]     Vision closing in & heart palpitations.      Current  Discharge Medication List    START taking these medications   Details  colchicine 0.6 MG tablet Take 1 tablet (0.6 mg total) by mouth daily. Qty: 30 tablet, Refills: 0  predniSONE (DELTASONE) 20 MG tablet Take 3 tablets once daily for 3 days, then 2 tablets once daily for 3 days, then 1 tablet once daily for 3 days, then STOP Qty: 18 tablet, Refills: 0      CONTINUE these medications which have CHANGED   Details  insulin glargine (LANTUS) 100 UNIT/ML injection Inject 0.6-1 mLs (60-100 Units total) into the skin 2 (two) times daily. 100 units in the morning and 60 units in the evening Qty: 10 mL, Refills: 11      CONTINUE these medications which have NOT CHANGED   Details  Calcium Carb-Cholecalciferol (CALCIUM 600 + D PO) Take 1 tablet by mouth 2 (two) times daily.     carvedilol (COREG) 25 MG tablet Take 1 tablet (25 mg total) by mouth 2 (two) times daily with a meal. Qty: 60 tablet, Refills: 6   Associated Diagnoses: Chronic systolic heart failure (HCC)    digoxin (LANOXIN) 0.125 MG tablet Take 1 tablet (0.125 mg total) by mouth daily. Qty: 30 tablet, Refills: 3    dimenhyDRINATE (DRAMAMINE) 50 MG tablet Take 50 mg by mouth every 8 (eight) hours as needed for nausea.    diphenhydrAMINE (SOMINEX) 25 MG tablet Take 25 mg by mouth at bedtime as needed for itching or sleep.    doxycycline (VIBRAMYCIN) 100 MG capsule Take 100-200 mg by mouth 2 (two) times daily as needed (rosacea).     ELIQUIS 5 MG TABS tablet TAKE (1) TABLET TWICE DAILY. Qty: 60 tablet, Refills: 3    ferrous sulfate 325 (65 FE) MG tablet TAKE 1 TABLET ONCE DAILY WITH BREAKFAST. Qty: 30 tablet, Refills: 0    furosemide (LASIX) 80 MG tablet Take 80 mg by mouth 2 (two) times daily.    metroNIDAZOLE (METROGEL) 0.75 % gel Apply 1 application topically 2 (two) times daily as needed (rosacea).    mometasone (ELOCON) 0.1 % lotion Apply 1 application topically daily as needed (eczema). EAR ECZEMA     oxyCODONE-acetaminophen (PERCOCET) 10-325 MG per tablet Take 1 tablet by mouth every 6 (six) hours as needed for pain.     polyethylene glycol (MIRALAX / GLYCOLAX) packet Take 17 g by mouth daily.    rosuvastatin (CRESTOR) 20 MG tablet TAKE 1 TABLET DAILY AT 6 PM Qty: 30 tablet, Refills: 0    sacubitril-valsartan (ENTRESTO) 24-26 MG TAKE (1) TABLET TWICE DAILY. Qty: 60 tablet, Refills: 3    spironolactone (ALDACTONE) 25 MG tablet Take 1 tablet (25 mg total) by mouth daily. Qty: 30 tablet, Refills: 6    venlafaxine (EFFEXOR) 75 MG tablet Take 75 mg by mouth 2 (two) times daily.       STOP taking these medications     metoprolol succinate (TOPROL-XL) 25 MG 24 hr tablet          Follow-up Information    TERRY DANIEL, MD Follow up in 1 week(s).   Specialty:  Family Medicine Why:  discuss definitive treatment for gout Contact information: [address] [place] [address] [phone]           TOTAL DISCHARGE TIME: 9 mins  [facility] Pager [phone]  06/16/2016, 4:12 PM

## 2016-06-16 NOTE — Progress Notes (Signed)
K. Schorr notified of pt's CBG reading 421.  STAT lab order placed.  Will continue to monitor.

## 2016-06-22 ENCOUNTER — Telehealth (HOSPITAL_COMMUNITY): Payer: Self-pay | Admitting: *Deleted

## 2016-06-22 NOTE — Telephone Encounter (Signed)
On 06/15/2016 our office received forms regarding FMLA/Disability leave from patient's daughter Geannie Risen.  Forms completed and faxed today as requested to [phone].  Called Geannie Risen and she is aware forms were faxed today.  Original forms will be scanned to patient's electronic medical records.

## 2016-06-24 DIAGNOSIS — E1122 Type 2 diabetes mellitus with diabetic chronic kidney disease: Secondary | ICD-10-CM | POA: Diagnosis not present

## 2016-06-24 DIAGNOSIS — R5382 Chronic fatigue, unspecified: Secondary | ICD-10-CM | POA: Diagnosis not present

## 2016-06-24 DIAGNOSIS — E782 Mixed hyperlipidemia: Secondary | ICD-10-CM | POA: Diagnosis not present

## 2016-06-24 DIAGNOSIS — F331 Major depressive disorder, recurrent, moderate: Secondary | ICD-10-CM | POA: Diagnosis not present

## 2016-06-24 DIAGNOSIS — Z6841 Body Mass Index (BMI) 40.0 and over, adult: Secondary | ICD-10-CM | POA: Diagnosis not present

## 2016-06-24 DIAGNOSIS — E6609 Other obesity due to excess calories: Secondary | ICD-10-CM | POA: Diagnosis not present

## 2016-06-24 DIAGNOSIS — I10 Essential (primary) hypertension: Secondary | ICD-10-CM | POA: Diagnosis not present

## 2016-07-07 ENCOUNTER — Encounter: Payer: Self-pay | Admitting: Internal Medicine

## 2016-07-12 ENCOUNTER — Ambulatory Visit (HOSPITAL_COMMUNITY)
Admission: RE | Admit: 2016-07-12 | Discharge: 2016-07-12 | Disposition: A | Discharge status: Home / Self Care | Payer: Medicare Other | Source: Ambulatory Visit | Attending: Internal Medicine | Admitting: Internal Medicine

## 2016-07-12 VITALS — BP 116/52 | HR 74 | Wt 272.0 lb

## 2016-07-12 DIAGNOSIS — I10 Essential (primary) hypertension: Secondary | ICD-10-CM | POA: Diagnosis not present

## 2016-07-12 DIAGNOSIS — I429 Cardiomyopathy, unspecified: Secondary | ICD-10-CM | POA: Insufficient documentation

## 2016-07-12 DIAGNOSIS — Z6841 Body Mass Index (BMI) 40.0 and over, adult: Secondary | ICD-10-CM | POA: Insufficient documentation

## 2016-07-12 DIAGNOSIS — I5022 Chronic systolic (congestive) heart failure: Secondary | ICD-10-CM

## 2016-07-12 DIAGNOSIS — M545 Low back pain: Secondary | ICD-10-CM | POA: Insufficient documentation

## 2016-07-12 DIAGNOSIS — Z794 Long term (current) use of insulin: Secondary | ICD-10-CM | POA: Diagnosis not present

## 2016-07-12 DIAGNOSIS — I447 Left bundle-branch block, unspecified: Secondary | ICD-10-CM

## 2016-07-12 DIAGNOSIS — D509 Iron deficiency anemia, unspecified: Secondary | ICD-10-CM | POA: Diagnosis not present

## 2016-07-12 DIAGNOSIS — L719 Rosacea, unspecified: Secondary | ICD-10-CM | POA: Diagnosis not present

## 2016-07-12 DIAGNOSIS — I252 Old myocardial infarction: Secondary | ICD-10-CM | POA: Diagnosis not present

## 2016-07-12 DIAGNOSIS — I2510 Atherosclerotic heart disease of native coronary artery without angina pectoris: Secondary | ICD-10-CM | POA: Diagnosis not present

## 2016-07-12 DIAGNOSIS — G8929 Other chronic pain: Secondary | ICD-10-CM | POA: Diagnosis not present

## 2016-07-12 DIAGNOSIS — I340 Nonrheumatic mitral (valve) insufficiency: Secondary | ICD-10-CM | POA: Insufficient documentation

## 2016-07-12 DIAGNOSIS — F329 Major depressive disorder, single episode, unspecified: Secondary | ICD-10-CM | POA: Insufficient documentation

## 2016-07-12 DIAGNOSIS — E871 Hypo-osmolality and hyponatremia: Secondary | ICD-10-CM | POA: Insufficient documentation

## 2016-07-12 DIAGNOSIS — K760 Fatty (change of) liver, not elsewhere classified: Secondary | ICD-10-CM | POA: Insufficient documentation

## 2016-07-12 DIAGNOSIS — E1122 Type 2 diabetes mellitus with diabetic chronic kidney disease: Secondary | ICD-10-CM | POA: Insufficient documentation

## 2016-07-12 DIAGNOSIS — E559 Vitamin D deficiency, unspecified: Secondary | ICD-10-CM | POA: Diagnosis not present

## 2016-07-12 DIAGNOSIS — I482 Chronic atrial fibrillation: Secondary | ICD-10-CM

## 2016-07-12 DIAGNOSIS — I130 Hypertensive heart and chronic kidney disease with heart failure and stage 1 through stage 4 chronic kidney disease, or unspecified chronic kidney disease: Secondary | ICD-10-CM | POA: Diagnosis not present

## 2016-07-12 DIAGNOSIS — M109 Gout, unspecified: Secondary | ICD-10-CM | POA: Insufficient documentation

## 2016-07-12 DIAGNOSIS — N183 Chronic kidney disease, stage 3 (moderate): Secondary | ICD-10-CM | POA: Insufficient documentation

## 2016-07-12 DIAGNOSIS — Z7901 Long term (current) use of anticoagulants: Secondary | ICD-10-CM | POA: Insufficient documentation

## 2016-07-12 DIAGNOSIS — K219 Gastro-esophageal reflux disease without esophagitis: Secondary | ICD-10-CM | POA: Insufficient documentation

## 2016-07-12 DIAGNOSIS — D649 Anemia, unspecified: Secondary | ICD-10-CM | POA: Insufficient documentation

## 2016-07-12 DIAGNOSIS — I4821 Permanent atrial fibrillation: Secondary | ICD-10-CM

## 2016-07-12 DIAGNOSIS — Z8249 Family history of ischemic heart disease and other diseases of the circulatory system: Secondary | ICD-10-CM | POA: Insufficient documentation

## 2016-07-12 LAB — BASIC METABOLIC PANEL
Anion gap: 11 (ref 5–15)
BUN: 14 mg/dL (ref 6–20)
CHLORIDE: 97 mmol/L — AB (ref 101–111)
CO2: 27 mmol/L (ref 22–32)
Calcium: 8.8 mg/dL — ABNORMAL LOW (ref 8.9–10.3)
Creatinine, Ser: 1.52 mg/dL — ABNORMAL HIGH (ref 0.44–1.00)
GFR calc Af Amer: 38 mL/min — ABNORMAL LOW (ref >60)
GFR calc non Af Amer: 33 mL/min — ABNORMAL LOW (ref >60)
Glucose, Bld: 295 mg/dL — ABNORMAL HIGH (ref 65–99)
POTASSIUM: 3.7 mmol/L (ref 3.5–5.1)
SODIUM: 135 mmol/L (ref 135–145)

## 2016-07-12 NOTE — Progress Notes (Signed)
Patient ID: [name], female   DOB: 02-21-1941, 76 y.o.   MRN: [id number]     Advanced Heart Failure Clinic Note   PCP: Dr. Gar Ponto ([facility] in [place]) Primary Cardiologist: Dr. Domenic Polite EP: Dr Caryl Comes  HF: Dr. Haroldine Laws   HPI: Ms. Osier is a 76 yo morbidly obese female with a history of chronic anemia, HTN, chronic A-Fib, nonischemic cardiomyopathy, poorly-controlled DM2, CAD, chronic back pain and chronic systolic HF.   Admitted to the hospital 5/13-5/27/15 for A/C HF and NSTEMI. Troponin peaked at 6.7 and was taken for cath 08/24/13 showing moderate diffuse mid LAD and septal perforator branck with ostial ~90% occluded. Treated medically. History of chronic anemia and Hgb continued to drop and GI consulted and they did EGD showing no evidence of bleeding or pathology. Cr started rising and PICC was placed for CVP monitoring and co-oxs. She was placed on milrinone and once weaned off co-ox remained marginal in the mid 58s. Discharge weight 236 lbs.   S/p CRT-D placement.1/18   Was admitted in 3/18 for flu-like symptoms accompanied by ARF and hyponatremia. Creatinine peaked at 2.1. Also diagnosed with gout.   Echo 06/15/16 EF 60-65%  She returns today for regular follow up. Says she is doing better but still not very mobile. Limited more by her back pain rather than her breathing but does get SOB easily. + mild edema. No orthopnea or PND. No CP. Family says she drinks a lot of fluid and eats ice.   S/p [employer] CRT-D placement 04/16/16  ECHO (08/2013) EF 20-25%, RV normal            (02/2014) EF 25-30%, RV normal           (09/2014) EF 20-25% RV mildly dilated.            (02/2016) EF 20-25% RV normal             Echo 06/15/16 EF 60-65%  Labs: (09/11/13): Creatinine 1.3, K 4.2, BUN 29, dig level 0.4 (09/20/13) Creatinine 1.36 K 4.9  (10/03/13) K 5.8, creatinine 1.67, BUN 51 (10/11/13) K 5.0, creatinine 1.21, BUN 26 (11/26/13) K 3.7 Creatinine 1.0  01/10/14 K 4.2 Cr 1.07  (02/27/14) K  4.0, creatinine 1.01 (12/15) K 4.1, creatinine 1.14 (04/16/2014) K 4.0 Creatinine 1.29 Hgb 13.8  (09/13/2014): K 4.0 Creatinine 1.53  (12/26/2014) K 3.1 Creatinuine 2.46   SH: Lives in [place] with son and daughter, no ETOH and does not smoke, retired  FH: Mother deceased: CAD?, HTN, lung cancer, afib        Father deceased: DM2, lung cancer, HTN, CAD stents        3 daughters (1 has afib)        1 son: healthy and alive  ROS: All systems negative except as listed in HPI, PMH and Problem List.  Past Medical History:  Diagnosis Date  . Allergic rhinitis   . 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 Atrial fibrillation ([place])   . Chronic low back pain    Secondary to DJD  . Chronic systolic heart failure (HCC)    a. echo (5/15):  mod LVH, EF 20-25%, diff HK, severe LAE, mild RAE  . Coronary atherosclerosis-non obstructive    LHC (5/15):  EF 40-45% global HK; long LAD 40-60%, ostial 1st major septal perf 80-90%, ostial CFX ?%, mid AVCFX extensive Ca2+, dist PDA 50%  . Depression   . Essential hypertension, benign   . Fatty liver  disease, nonalcoholic   . GERD (gastroesophageal reflux disease)   . Iron deficiency anemia   . Mitral regurgitation   . NICM (nonischemic cardiomyopathy) ([place])    a. echo (5/15):  mod LVH, EF 20-25%, diff HK, severe LAE, mild RAE  . NSTEMI (non-ST elevated myocardial infarction) ([place]) may 2015   No CAD  . Obesity   . Osteopenia   . PONV (postoperative nausea and vomiting)   . Rosacea   . Type 2 diabetes mellitus ([place])   . Vitamin D deficiency     Current Outpatient Prescriptions  Medication Sig Dispense Refill  . Calcium Carb-Cholecalciferol (CALCIUM 600 + D PO) Take 1 tablet by mouth 2 (two) times daily.     . carvedilol (COREG) 25 MG tablet Take 1 tablet (25 mg total) by mouth 2 (two) times daily with a meal. 60 tablet 6  . colchicine 0.6 MG tablet Take 1 tablet (0.6 mg total) by mouth daily. 30 tablet 0  . digoxin (LANOXIN) 0.125 MG tablet Take 1 tablet  (0.125 mg total) by mouth daily. 30 tablet 3  . dimenhyDRINATE (DRAMAMINE) 50 MG tablet Take 50 mg by mouth every 8 (eight) hours as needed for nausea.    . diphenhydrAMINE (SOMINEX) 25 MG tablet Take 25 mg by mouth at bedtime as needed for itching or sleep.    Marland Kitchen doxycycline (VIBRAMYCIN) 100 MG capsule Take 100-200 mg by mouth 2 (two) times daily as needed (rosacea).     Marland Kitchen ELIQUIS 5 MG TABS tablet TAKE (1) TABLET TWICE DAILY. 60 tablet 3  . ferrous sulfate 325 (65 FE) MG tablet TAKE 1 TABLET ONCE DAILY WITH BREAKFAST. 30 tablet 0  . furosemide (LASIX) 80 MG tablet Take 80 mg by mouth 2 (two) times daily.    . insulin glargine (LANTUS) 100 UNIT/ML injection Inject 0.6-1 mLs (60-100 Units total) into the skin 2 (two) times daily. 100 units in the morning and 60 units in the evening 10 mL 11  . metroNIDAZOLE (METROGEL) 0.75 % gel Apply 1 application topically 2 (two) times daily as needed (rosacea).    . mometasone (ELOCON) 0.1 % lotion Apply 1 application topically daily as needed (eczema). EAR ECZEMA    . oxyCODONE-acetaminophen (PERCOCET) 10-325 MG per tablet Take 1 tablet by mouth every 6 (six) hours as needed for pain.     . polyethylene glycol (MIRALAX / GLYCOLAX) packet Take 17 g by mouth daily.    . rosuvastatin (CRESTOR) 20 MG tablet TAKE 1 TABLET DAILY AT 6 PM 30 tablet 0  . sacubitril-valsartan (ENTRESTO) 24-26 MG TAKE (1) TABLET TWICE DAILY. 60 tablet 3  . spironolactone (ALDACTONE) 25 MG tablet Take 12.5 mg by mouth daily.    Marland Kitchen venlafaxine (EFFEXOR) 75 MG tablet Take 75 mg by mouth 2 (two) times daily.      No current facility-administered medications for this encounter.      Vitals:   07/12/16 1450  BP: (!) 116/52  Pulse: 74  SpO2: 93%  Weight: 272 lb (123.4 kg)   Wt Readings from Last 3 Encounters:  07/12/16 272 lb (123.4 kg)  06/16/16 269 lb 9.6 oz (122.3 kg)  05/06/16 281 lb (127.5 kg)     PHYSICAL EXAM: General: obese woman sitting in WC NAD Daughters present.      HEENT: Normal anicteric Neck: supple. Thick. JVP 7-8. Carotids 2+ bilaterally; no bruits. No thyromegaly or nodule noted.  Cor: PMI nonpalpable Regular rhythm no m/r/g Lungs: CTAB, no wheezing Abdomen: markedly obese,  soft, NT, ND, no HSM. No bruits or masses. Good bowel sounds  Extremities: no cyanosis, clubbing, rash,  Trace - 1+ ankle edema.   Neuro: alert & orientedx3, cranial nerves grossly intact. Moves all 4 extremities w/o difficulty. Affect pleasant.   ASSESSMENT & PLAN: 1) Chronic systolic HF: NICM, EF 90-30%, RV nl (02/2014).  ECHO 09/2014 EF 20-25%. ECHO 01/2016 EF 20-25%.  - s/p CRT-D 1/18 -> Echo 3/18 EF 60-65% EF recovered with CRT-D - Chronic NYHA III symptoms. More limited by back pain and obesity  - Volume status looks stable on exam.  Continue lasix 80 mg BID.  Given normalized EF, we discussed cutting back lasix but her volume intake is too high.  - Continue coreg 25 mg twice a day - Continue Entresto 24/26 mg BID.  - Continue spironolactone 25 mg daily.    - Labs today - Reinforced fluid restriction to < 2 L daily, sodium restriction to less than 2000 mg daily, and the importance of daily weights.    2) CAD: Moderate diffuse mid LAD disease of 40-60%. Septal perforator branch with ostial ~90%.  - No chest pain.  - Continue statin and BB.  3) CKD stage III: BMET/BNP today.     4) Afib: Chronic with rate control. - Good rate control - Continue eliquis 5 mg BID.   5) Falls - No recent falls.  6) LBBB- EKG 01/2016 QRS 160 ms  - s/p CRT-D 1/18 7) HTN - Blood pressure well controlled. Continue current regimen.   Glori Bickers, MD  07/12/2016

## 2016-07-12 NOTE — Patient Instructions (Signed)
Labs today  We will contact you in 4 months to schedule your next appointment.  

## 2016-07-16 ENCOUNTER — Other Ambulatory Visit: Payer: Self-pay | Admitting: Physician Assistant

## 2016-07-19 ENCOUNTER — Encounter: Payer: Self-pay | Admitting: Internal Medicine

## 2016-07-19 ENCOUNTER — Ambulatory Visit (INDEPENDENT_AMBULATORY_CARE_PROVIDER_SITE_OTHER): Payer: Medicare Other | Admitting: Internal Medicine

## 2016-07-19 VITALS — BP 114/56 | HR 64 | Ht 67.0 in | Wt 273.0 lb

## 2016-07-19 DIAGNOSIS — I5022 Chronic systolic (congestive) heart failure: Secondary | ICD-10-CM

## 2016-07-19 DIAGNOSIS — Z95810 Presence of automatic (implantable) cardiac defibrillator: Secondary | ICD-10-CM

## 2016-07-19 DIAGNOSIS — I482 Chronic atrial fibrillation: Secondary | ICD-10-CM

## 2016-07-19 DIAGNOSIS — I428 Other cardiomyopathies: Secondary | ICD-10-CM | POA: Diagnosis not present

## 2016-07-19 DIAGNOSIS — I4821 Permanent atrial fibrillation: Secondary | ICD-10-CM

## 2016-07-19 NOTE — Progress Notes (Signed)
Patient Care Team: Caryl Bis, MD as PCP - General (Unknown Physician Specialty)   HPI  Victoria Adams is a 76 y.o. female Seen in follow-up CRT implanted 1/18 for congestive heart failure in the setting of nonischemic cardiomyopathy and long-standing atrial fibrillation  Echocardiogram 11/17 EF 20-25%.  Echocardiogram 3/18 had demonstrated normalization of LV function.  Breathing is somewhat improved. She also has significant limitations in activity because of her back. She was seen in heart failure clinic last week. Aldactone was done titrated.   She has complaints of left shoulder soreness.    Records and Results Reviewed  Past Medical History:  Diagnosis Date  . Allergic rhinitis   . 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 Atrial fibrillation ([place])   . Chronic low back pain    Secondary to DJD  . Chronic systolic heart failure (HCC)    a. echo (5/15):  mod LVH, EF 20-25%, diff HK, severe LAE, mild RAE  . Coronary atherosclerosis-non obstructive    LHC (5/15):  EF 40-45% global HK; long LAD 40-60%, ostial 1st major septal perf 80-90%, ostial CFX ?%, mid AVCFX extensive Ca2+, dist PDA 50%  . Depression   . Essential hypertension, benign   . Fatty liver disease, nonalcoholic   . GERD (gastroesophageal reflux disease)   . Iron deficiency anemia   . Mitral regurgitation   . NICM (nonischemic cardiomyopathy) ([place])    a. echo (5/15):  mod LVH, EF 20-25%, diff HK, severe LAE, mild RAE  . NSTEMI (non-ST elevated myocardial infarction) ([place]) may 2015   No CAD  . Obesity   . Osteopenia   . PONV (postoperative nausea and vomiting)   . Rosacea   . Type 2 diabetes mellitus ([place])   . Vitamin D deficiency     Past Surgical History:  Procedure Laterality Date  . ABDOMINAL HYSTERECTOMY    . APPENDECTOMY    . BREAST BIOPSY     RIGHT  . CARDIAC CATHETERIZATION  2010   LAD 50%, CFX 30%, RCA 40%; EF by echo 25-35%  . CATARACT EXTRACTION Bilateral   . CHOLECYSTECTOMY      . COLONOSCOPY WITH PROPOFOL N/A 12/20/2013   Procedure: COLONOSCOPY WITH PROPOFOL;  Surgeon: Milus Banister, MD;  Location: [facility];  Service: Endoscopy;  Laterality: N/A;  . EP IMPLANTABLE DEVICE N/A 04/16/2016   Procedure: BiV ICD Insertion CRT-D;  Surgeon: Deboraha Sprang, MD;  Location: [facility];  Service: Cardiovascular;  Laterality: N/A;  . ESOPHAGOGASTRODUODENOSCOPY N/A 08/25/2013   Procedure: ESOPHAGOGASTRODUODENOSCOPY (EGD);  Surgeon: Milus Banister, MD;  Location: [facility];  Service: Endoscopy;  Laterality: N/A;  . LEFT HEART CATHETERIZATION WITH CORONARY ANGIOGRAM N/A 08/24/2013   Procedure: LEFT HEART CATHETERIZATION WITH CORONARY ANGIOGRAM;  Surgeon: Leonie Man, MD;  Location: [facility] CATH LAB;  Service: Cardiovascular;  Laterality: N/A;  . ROTATOR CUFF REPAIR     RIGHT SHOULDER  . TONSILLECTOMY  yrs ago  . TOTAL ABDOMINAL HYSTERECTOMY W/ BILATERAL SALPINGOOPHORECTOMY     for dermoid tumor    Current Outpatient Prescriptions  Medication Sig Dispense Refill  . Calcium Carb-Cholecalciferol (CALCIUM 600 + D PO) Take 1 tablet by mouth 2 (two) times daily.     . carvedilol (COREG) 25 MG tablet Take 1 tablet (25 mg total) by mouth 2 (two) times daily with a meal. 60 tablet 6  . colchicine 0.6 MG tablet Take 1 tablet (0.6 mg total) by  mouth daily. 30 tablet 0  . digoxin (LANOXIN) 0.125 MG tablet Take 1 tablet (0.125 mg total) by mouth daily. 30 tablet 3  . dimenhyDRINATE (DRAMAMINE) 50 MG tablet Take 50 mg by mouth every 8 (eight) hours as needed for nausea.    Marland Kitchen doxycycline (VIBRAMYCIN) 100 MG capsule Take 100-200 mg by mouth 2 (two) times daily as needed (rosacea).     Marland Kitchen ELIQUIS 5 MG TABS tablet TAKE (1) TABLET TWICE DAILY. 60 tablet 3  . ferrous sulfate 325 (65 FE) MG tablet TAKE 1 TABLET ONCE DAILY WITH BREAKFAST. 30 tablet 0  . furosemide (LASIX) 80 MG tablet Take 80 mg by mouth 2 (two) times daily.    . insulin glargine (LANTUS) 100 UNIT/ML injection Inject  0.6-1 mLs (60-100 Units total) into the skin 2 (two) times daily. 100 units in the morning and 60 units in the evening 10 mL 11  . metoprolol succinate (TOPROL-XL) 25 MG 24 hr tablet TAKE (1) TABLET BY MOUTH ONCE DAILY. 30 tablet 0  . metroNIDAZOLE (METROGEL) 0.75 % gel Apply 1 application topically 2 (two) times daily as needed (rosacea).    . mometasone (ELOCON) 0.1 % lotion Apply 1 application topically daily as needed (eczema). EAR ECZEMA    . oxyCODONE-acetaminophen (PERCOCET) 10-325 MG per tablet Take 1 tablet by mouth every 6 (six) hours as needed for pain.     . polyethylene glycol (MIRALAX / GLYCOLAX) packet Take 17 g by mouth daily.    . rosuvastatin (CRESTOR) 20 MG tablet TAKE 1 TABLET DAILY AT 6 PM 30 tablet 0  . sacubitril-valsartan (ENTRESTO) 24-26 MG TAKE (1) TABLET TWICE DAILY. 60 tablet 3  . spironolactone (ALDACTONE) 25 MG tablet Take 12.5 mg by mouth daily.    Marland Kitchen venlafaxine (EFFEXOR) 75 MG tablet Take 75 mg by mouth 2 (two) times daily.      No current facility-administered medications for this visit.     Allergies  Allergen Reactions  . Codeine Nausea And Vomiting  . Tylox [Oxycodone-Acetaminophen]     Vision closing in & heart palpitations.       Review of Systems negative except from HPI and PMH  Physical Exam BP (!) 114/56   Pulse 64   Ht 5\' 7"  (1.702 m)   Wt 273 lb (123.8 kg)   SpO2 92%   BMI 42.76 kg/m  Well developed and well nourished in no acute distress HENT normal E scleral and icterus clear Neck Supple JVP flat;   Clear to ausculation Device pocket well healed; without hematoma or erythema.  There is no tethering  IRRegular rate and rhythm, no murmurs gallops or rub Soft with active bowel sounds No clubbing cyanosis Trace Edema Alert and oriented, grossly normal motor and sensory function sitting in wheel chair Skin Warm and Dry  ECG Personally reviewed  Biventricular pacing with upright QRS in V1 negative QRS in lead 1  intervals-/16/44 PVC right bundle inferior axis     Assessment  and  Plan  Congestive heart failure-chronic-diastolic  Nonischemic cardiomyopathy   Atrial fibrillation-permanent  Chronic back pain  PVCs  Left bundle branch block like IVCD    there has been interval normalization of LV function. Given that an adequate rate control we will discontinue her digoxin. The risk benefit ratio no longer justifies its use   On Anticoagulation;  No bleeding issues   Euvolemic continue current meds'  Her PVC burden is sufficient to justify intervention; moreover, interval normalization of LV function highlights their  relative contribution not withstanding the fact that they diminish CRT pacing to the mid 90s.   Current medicines are reviewed at length with the patient today .  The patient does not have concerns regarding medicines.

## 2016-07-19 NOTE — Patient Instructions (Addendum)
Medication Instructions: - Your physician has recommended you make the following change in your medication:  1) Stop digoxin  Labwork: - none ordered  Procedures/Testing: - none ordered  Follow-Up: - Remote monitoring is used to monitor your Pacemaker of ICD from home. This monitoring reduces the number of office visits required to check your device to one time per year. It allows [place] to keep an eye on the functioning of your device to ensure it is working properly. You are scheduled for a device check from home on 10/18/16. You may send your transmission at any time that day. If you have a wireless device, the transmission will be sent automatically. After your physician reviews your transmission, you will receive a postcard with your next transmission date.  - Your physician wants you to follow-up in: 9 months with Dr. Caryl Comes. You will receive a reminder letter in the mail two months in advance. If you don't receive a letter, please call our office to schedule the follow-up appointment.   Any Additional Special Instructions Will Be Listed Below (If Applicable).     If you need a refill on your cardiac medications before your next appointment, please call your pharmacy.

## 2016-07-20 LAB — CUP PACEART INCLINIC DEVICE CHECK
Date Time Interrogation Session: 20180410191815
Implantable Lead Implant Date: 20180105
Implantable Lead Location: 753860
MDC IDC LEAD IMPLANT DT: 20180105
MDC IDC LEAD LOCATION: 753858
MDC IDC PG IMPLANT DT: 20180105

## 2016-07-26 DIAGNOSIS — E1122 Type 2 diabetes mellitus with diabetic chronic kidney disease: Secondary | ICD-10-CM | POA: Diagnosis not present

## 2016-07-26 DIAGNOSIS — E6609 Other obesity due to excess calories: Secondary | ICD-10-CM | POA: Diagnosis not present

## 2016-07-26 DIAGNOSIS — I2510 Atherosclerotic heart disease of native coronary artery without angina pectoris: Secondary | ICD-10-CM | POA: Diagnosis not present

## 2016-07-26 DIAGNOSIS — I5032 Chronic diastolic (congestive) heart failure: Secondary | ICD-10-CM | POA: Diagnosis not present

## 2016-07-26 DIAGNOSIS — I10 Essential (primary) hypertension: Secondary | ICD-10-CM | POA: Diagnosis not present

## 2016-07-26 DIAGNOSIS — K219 Gastro-esophageal reflux disease without esophagitis: Secondary | ICD-10-CM | POA: Diagnosis not present

## 2016-07-26 DIAGNOSIS — F331 Major depressive disorder, recurrent, moderate: Secondary | ICD-10-CM | POA: Diagnosis not present

## 2016-07-26 DIAGNOSIS — E782 Mixed hyperlipidemia: Secondary | ICD-10-CM | POA: Diagnosis not present

## 2016-07-28 ENCOUNTER — Other Ambulatory Visit: Payer: Self-pay | Admitting: Cardiology

## 2016-08-04 ENCOUNTER — Telehealth (HOSPITAL_COMMUNITY): Payer: Self-pay | Admitting: Internal Medicine

## 2016-08-04 NOTE — Telephone Encounter (Signed)
Called and left message on pt's cell (home # no answer machine) to advise that APP would be out of the office on 11/11/16 in the afternoon.  I left a message that I had rescheduled pt's appt to same day at 11:30 am and asked pt to call back to confirm she received the message.

## 2016-08-05 NOTE — Telephone Encounter (Signed)
Called and spoke with patient's daughter, she requested we reschedule patient's appt to an afternoon.  Pt rescheduled to 11/22/16 @3  pm with Amy.

## 2016-08-23 DIAGNOSIS — M79671 Pain in right foot: Secondary | ICD-10-CM

## 2016-09-17 ENCOUNTER — Other Ambulatory Visit (HOSPITAL_COMMUNITY): Payer: Self-pay | Admitting: Adult Health

## 2016-09-17 ENCOUNTER — Other Ambulatory Visit: Payer: Self-pay | Admitting: Cardiology

## 2016-09-17 ENCOUNTER — Other Ambulatory Visit: Payer: Self-pay | Admitting: Physician Assistant

## 2016-09-17 DIAGNOSIS — I5022 Chronic systolic (congestive) heart failure: Secondary | ICD-10-CM

## 2016-10-12 ENCOUNTER — Telehealth: Payer: Self-pay | Admitting: Internal Medicine

## 2016-10-12 NOTE — Telephone Encounter (Signed)
Patient has never picked up FMLA signed by Dr.Klein on 05/06/16. I have mailed to patient home address.

## 2016-10-18 ENCOUNTER — Ambulatory Visit (INDEPENDENT_AMBULATORY_CARE_PROVIDER_SITE_OTHER): Payer: Medicare Other | Admitting: *Deleted

## 2016-10-18 ENCOUNTER — Other Ambulatory Visit (HOSPITAL_COMMUNITY): Payer: Self-pay | Admitting: Adult Health

## 2016-10-18 ENCOUNTER — Other Ambulatory Visit: Payer: Self-pay | Admitting: Cardiology

## 2016-10-18 DIAGNOSIS — I428 Other cardiomyopathies: Secondary | ICD-10-CM

## 2016-10-18 DIAGNOSIS — I5022 Chronic systolic (congestive) heart failure: Secondary | ICD-10-CM

## 2016-10-18 NOTE — Progress Notes (Signed)
Remote ICD transmission.   

## 2016-10-19 LAB — CUP PACEART REMOTE DEVICE CHECK
Battery Remaining Longevity: 104 mo
Battery Voltage: 3.04 V
Brady Statistic AP VP Percent: 0 %
Brady Statistic AS VS Percent: 0 %
Brady Statistic RV Percent Paced: 89.31 %
HighPow Impedance: 79 Ohm
Implantable Lead Location: 753858
Implantable Lead Location: 753860
Implantable Lead Model: 4598
Implantable Pulse Generator Implant Date: 20180105
Lead Channel Impedance Value: 184.154
Lead Channel Impedance Value: 205.2 Ohm
Lead Channel Impedance Value: 235.98 Ohm
Lead Channel Impedance Value: 342 Ohm
Lead Channel Impedance Value: 399 Ohm
Lead Channel Impedance Value: 4047 Ohm
Lead Channel Impedance Value: 437 Ohm
Lead Channel Impedance Value: 779 Ohm
Lead Channel Impedance Value: 836 Ohm
Lead Channel Impedance Value: 836 Ohm
Lead Channel Pacing Threshold Amplitude: 0.5 V
Lead Channel Pacing Threshold Pulse Width: 0.4 ms
Lead Channel Setting Pacing Amplitude: 1 V
Lead Channel Setting Pacing Amplitude: 2 V
Lead Channel Setting Pacing Pulse Width: 0.8 ms
MDC IDC LEAD IMPLANT DT: 20180105
MDC IDC LEAD IMPLANT DT: 20180105
MDC IDC MSMT LEADCHNL LV IMPEDANCE VALUE: 191.854
MDC IDC MSMT LEADCHNL LV IMPEDANCE VALUE: 224.438
MDC IDC MSMT LEADCHNL LV IMPEDANCE VALUE: 513 Ohm
MDC IDC MSMT LEADCHNL LV IMPEDANCE VALUE: 532 Ohm
MDC IDC MSMT LEADCHNL LV IMPEDANCE VALUE: 665 Ohm
MDC IDC MSMT LEADCHNL LV IMPEDANCE VALUE: 703 Ohm
MDC IDC MSMT LEADCHNL LV PACING THRESHOLD AMPLITUDE: 0.5 V
MDC IDC MSMT LEADCHNL LV PACING THRESHOLD PULSEWIDTH: 0.8 ms
MDC IDC MSMT LEADCHNL RV IMPEDANCE VALUE: 570 Ohm
MDC IDC MSMT LEADCHNL RV IMPEDANCE VALUE: 627 Ohm
MDC IDC MSMT LEADCHNL RV SENSING INTR AMPL: 15.125 mV
MDC IDC MSMT LEADCHNL RV SENSING INTR AMPL: 15.125 mV
MDC IDC SESS DTM: 20180709112503
MDC IDC SET LEADCHNL RV PACING PULSEWIDTH: 0.4 ms
MDC IDC SET LEADCHNL RV SENSING SENSITIVITY: 0.3 mV
MDC IDC STAT BRADY AP VS PERCENT: 0 %
MDC IDC STAT BRADY AS VP PERCENT: 0 %
MDC IDC STAT BRADY RA PERCENT PACED: 0 %

## 2016-10-22 ENCOUNTER — Encounter: Payer: Self-pay | Admitting: Cardiology

## 2016-10-22 DIAGNOSIS — K219 Gastro-esophageal reflux disease without esophagitis: Secondary | ICD-10-CM | POA: Diagnosis not present

## 2016-10-22 DIAGNOSIS — F331 Major depressive disorder, recurrent, moderate: Secondary | ICD-10-CM | POA: Diagnosis not present

## 2016-10-22 DIAGNOSIS — I482 Chronic atrial fibrillation: Secondary | ICD-10-CM | POA: Diagnosis not present

## 2016-10-22 DIAGNOSIS — E1122 Type 2 diabetes mellitus with diabetic chronic kidney disease: Secondary | ICD-10-CM | POA: Diagnosis not present

## 2016-10-22 DIAGNOSIS — I10 Essential (primary) hypertension: Secondary | ICD-10-CM | POA: Diagnosis not present

## 2016-10-22 DIAGNOSIS — G6289 Other specified polyneuropathies: Secondary | ICD-10-CM | POA: Diagnosis not present

## 2016-10-22 DIAGNOSIS — J301 Allergic rhinitis due to pollen: Secondary | ICD-10-CM | POA: Diagnosis not present

## 2016-10-22 DIAGNOSIS — E6609 Other obesity due to excess calories: Secondary | ICD-10-CM | POA: Diagnosis not present

## 2016-10-22 DIAGNOSIS — E782 Mixed hyperlipidemia: Secondary | ICD-10-CM | POA: Diagnosis not present

## 2016-10-22 DIAGNOSIS — Z0001 Encounter for general adult medical examination with abnormal findings: Secondary | ICD-10-CM | POA: Diagnosis not present

## 2016-10-22 DIAGNOSIS — I2510 Atherosclerotic heart disease of native coronary artery without angina pectoris: Secondary | ICD-10-CM | POA: Diagnosis not present

## 2016-10-22 DIAGNOSIS — M545 Low back pain: Secondary | ICD-10-CM | POA: Diagnosis not present

## 2016-10-22 DIAGNOSIS — I5032 Chronic diastolic (congestive) heart failure: Secondary | ICD-10-CM | POA: Diagnosis not present

## 2016-10-22 DIAGNOSIS — Z6841 Body Mass Index (BMI) 40.0 and over, adult: Secondary | ICD-10-CM | POA: Diagnosis not present

## 2016-11-05 ENCOUNTER — Encounter: Payer: Self-pay | Admitting: Cardiology

## 2016-11-10 DIAGNOSIS — K219 Gastro-esophageal reflux disease without esophagitis: Secondary | ICD-10-CM | POA: Diagnosis not present

## 2016-11-10 DIAGNOSIS — I252 Old myocardial infarction: Secondary | ICD-10-CM | POA: Diagnosis not present

## 2016-11-10 DIAGNOSIS — Z7901 Long term (current) use of anticoagulants: Secondary | ICD-10-CM | POA: Diagnosis not present

## 2016-11-10 DIAGNOSIS — Z95810 Presence of automatic (implantable) cardiac defibrillator: Secondary | ICD-10-CM | POA: Diagnosis not present

## 2016-11-10 DIAGNOSIS — E669 Obesity, unspecified: Secondary | ICD-10-CM | POA: Diagnosis not present

## 2016-11-10 DIAGNOSIS — E7800 Pure hypercholesterolemia, unspecified: Secondary | ICD-10-CM | POA: Diagnosis not present

## 2016-11-10 DIAGNOSIS — I4891 Unspecified atrial fibrillation: Secondary | ICD-10-CM | POA: Diagnosis not present

## 2016-11-10 DIAGNOSIS — E119 Type 2 diabetes mellitus without complications: Secondary | ICD-10-CM | POA: Diagnosis not present

## 2016-11-10 DIAGNOSIS — Z794 Long term (current) use of insulin: Secondary | ICD-10-CM | POA: Diagnosis not present

## 2016-11-10 DIAGNOSIS — I110 Hypertensive heart disease with heart failure: Secondary | ICD-10-CM | POA: Diagnosis not present

## 2016-11-10 DIAGNOSIS — R0602 Shortness of breath: Secondary | ICD-10-CM | POA: Diagnosis not present

## 2016-11-10 DIAGNOSIS — Z79899 Other long term (current) drug therapy: Secondary | ICD-10-CM | POA: Diagnosis not present

## 2016-11-10 DIAGNOSIS — I509 Heart failure, unspecified: Secondary | ICD-10-CM | POA: Diagnosis not present

## 2016-11-11 ENCOUNTER — Encounter (HOSPITAL_COMMUNITY): Payer: Medicare Other

## 2016-11-11 DIAGNOSIS — Z6841 Body Mass Index (BMI) 40.0 and over, adult: Secondary | ICD-10-CM | POA: Diagnosis not present

## 2016-11-11 DIAGNOSIS — I5023 Acute on chronic systolic (congestive) heart failure: Secondary | ICD-10-CM | POA: Diagnosis not present

## 2016-11-11 DIAGNOSIS — M545 Low back pain: Secondary | ICD-10-CM | POA: Diagnosis not present

## 2016-11-19 DIAGNOSIS — N289 Disorder of kidney and ureter, unspecified: Secondary | ICD-10-CM | POA: Diagnosis not present

## 2016-11-19 DIAGNOSIS — Z79899 Other long term (current) drug therapy: Secondary | ICD-10-CM | POA: Diagnosis not present

## 2016-11-19 DIAGNOSIS — Z7901 Long term (current) use of anticoagulants: Secondary | ICD-10-CM | POA: Diagnosis not present

## 2016-11-19 DIAGNOSIS — E7800 Pure hypercholesterolemia, unspecified: Secondary | ICD-10-CM | POA: Diagnosis not present

## 2016-11-19 DIAGNOSIS — Z794 Long term (current) use of insulin: Secondary | ICD-10-CM | POA: Diagnosis not present

## 2016-11-19 DIAGNOSIS — K529 Noninfective gastroenteritis and colitis, unspecified: Secondary | ICD-10-CM | POA: Diagnosis not present

## 2016-11-19 DIAGNOSIS — K219 Gastro-esophageal reflux disease without esophagitis: Secondary | ICD-10-CM | POA: Diagnosis not present

## 2016-11-19 DIAGNOSIS — R05 Cough: Secondary | ICD-10-CM | POA: Diagnosis not present

## 2016-11-19 DIAGNOSIS — I509 Heart failure, unspecified: Secondary | ICD-10-CM | POA: Diagnosis not present

## 2016-11-19 DIAGNOSIS — E860 Dehydration: Secondary | ICD-10-CM | POA: Diagnosis not present

## 2016-11-19 DIAGNOSIS — I110 Hypertensive heart disease with heart failure: Secondary | ICD-10-CM | POA: Diagnosis not present

## 2016-11-19 DIAGNOSIS — E119 Type 2 diabetes mellitus without complications: Secondary | ICD-10-CM | POA: Diagnosis not present

## 2016-11-19 DIAGNOSIS — I4891 Unspecified atrial fibrillation: Secondary | ICD-10-CM | POA: Diagnosis not present

## 2016-11-19 DIAGNOSIS — E871 Hypo-osmolality and hyponatremia: Secondary | ICD-10-CM | POA: Diagnosis not present

## 2016-11-22 ENCOUNTER — Encounter (HOSPITAL_COMMUNITY): Payer: Self-pay

## 2016-11-22 ENCOUNTER — Ambulatory Visit (HOSPITAL_COMMUNITY)
Admission: RE | Admit: 2016-11-22 | Discharge: 2016-11-22 | Disposition: A | Discharge status: Home / Self Care | Payer: Medicare Other | Source: Ambulatory Visit | Attending: Internal Medicine | Admitting: Internal Medicine

## 2016-11-22 VITALS — Wt 270.2 lb

## 2016-11-22 DIAGNOSIS — I130 Hypertensive heart and chronic kidney disease with heart failure and stage 1 through stage 4 chronic kidney disease, or unspecified chronic kidney disease: Secondary | ICD-10-CM | POA: Insufficient documentation

## 2016-11-22 DIAGNOSIS — I252 Old myocardial infarction: Secondary | ICD-10-CM | POA: Insufficient documentation

## 2016-11-22 DIAGNOSIS — I428 Other cardiomyopathies: Secondary | ICD-10-CM | POA: Diagnosis not present

## 2016-11-22 DIAGNOSIS — N183 Chronic kidney disease, stage 3 unspecified: Secondary | ICD-10-CM

## 2016-11-22 DIAGNOSIS — Z95810 Presence of automatic (implantable) cardiac defibrillator: Secondary | ICD-10-CM

## 2016-11-22 DIAGNOSIS — I340 Nonrheumatic mitral (valve) insufficiency: Secondary | ICD-10-CM | POA: Insufficient documentation

## 2016-11-22 DIAGNOSIS — I482 Chronic atrial fibrillation: Secondary | ICD-10-CM | POA: Diagnosis not present

## 2016-11-22 DIAGNOSIS — Z833 Family history of diabetes mellitus: Secondary | ICD-10-CM | POA: Insufficient documentation

## 2016-11-22 DIAGNOSIS — E559 Vitamin D deficiency, unspecified: Secondary | ICD-10-CM | POA: Diagnosis not present

## 2016-11-22 DIAGNOSIS — I447 Left bundle-branch block, unspecified: Secondary | ICD-10-CM | POA: Diagnosis not present

## 2016-11-22 DIAGNOSIS — K219 Gastro-esophageal reflux disease without esophagitis: Secondary | ICD-10-CM | POA: Insufficient documentation

## 2016-11-22 DIAGNOSIS — Z7902 Long term (current) use of antithrombotics/antiplatelets: Secondary | ICD-10-CM | POA: Insufficient documentation

## 2016-11-22 DIAGNOSIS — Z801 Family history of malignant neoplasm of trachea, bronchus and lung: Secondary | ICD-10-CM | POA: Diagnosis not present

## 2016-11-22 DIAGNOSIS — F329 Major depressive disorder, single episode, unspecified: Secondary | ICD-10-CM | POA: Diagnosis not present

## 2016-11-22 DIAGNOSIS — I10 Essential (primary) hypertension: Secondary | ICD-10-CM | POA: Diagnosis not present

## 2016-11-22 DIAGNOSIS — K760 Fatty (change of) liver, not elsewhere classified: Secondary | ICD-10-CM | POA: Diagnosis not present

## 2016-11-22 DIAGNOSIS — I2510 Atherosclerotic heart disease of native coronary artery without angina pectoris: Secondary | ICD-10-CM | POA: Diagnosis not present

## 2016-11-22 DIAGNOSIS — I4821 Permanent atrial fibrillation: Secondary | ICD-10-CM

## 2016-11-22 DIAGNOSIS — M8580 Other specified disorders of bone density and structure, unspecified site: Secondary | ICD-10-CM | POA: Diagnosis not present

## 2016-11-22 DIAGNOSIS — Z794 Long term (current) use of insulin: Secondary | ICD-10-CM | POA: Diagnosis not present

## 2016-11-22 DIAGNOSIS — I5022 Chronic systolic (congestive) heart failure: Secondary | ICD-10-CM

## 2016-11-22 DIAGNOSIS — I429 Cardiomyopathy, unspecified: Secondary | ICD-10-CM | POA: Diagnosis not present

## 2016-11-22 DIAGNOSIS — E1122 Type 2 diabetes mellitus with diabetic chronic kidney disease: Secondary | ICD-10-CM | POA: Insufficient documentation

## 2016-11-22 DIAGNOSIS — D631 Anemia in chronic kidney disease: Secondary | ICD-10-CM | POA: Insufficient documentation

## 2016-11-22 DIAGNOSIS — Z8249 Family history of ischemic heart disease and other diseases of the circulatory system: Secondary | ICD-10-CM | POA: Diagnosis not present

## 2016-11-22 DIAGNOSIS — Z6841 Body Mass Index (BMI) 40.0 and over, adult: Secondary | ICD-10-CM | POA: Diagnosis not present

## 2016-11-22 LAB — BASIC METABOLIC PANEL
ANION GAP: 12 (ref 5–15)
BUN: 67 mg/dL — ABNORMAL HIGH (ref 6–20)
CALCIUM: 8.6 mg/dL — AB (ref 8.9–10.3)
CO2: 30 mmol/L (ref 22–32)
CREATININE: 2.73 mg/dL — AB (ref 0.44–1.00)
Chloride: 89 mmol/L — ABNORMAL LOW (ref 101–111)
GFR, EST AFRICAN AMERICAN: 19 mL/min — AB (ref >60)
GFR, EST NON AFRICAN AMERICAN: 16 mL/min — AB (ref >60)
Glucose, Bld: 151 mg/dL — ABNORMAL HIGH (ref 65–99)
Potassium: 2.9 mmol/L — ABNORMAL LOW (ref 3.5–5.1)
Sodium: 131 mmol/L — ABNORMAL LOW (ref 135–145)

## 2016-11-22 MED ORDER — FUROSEMIDE 40 MG PO TABS: 40.0000 mg | ORAL_TABLET | Freq: Two times a day (BID) | ORAL | 6 refills | Status: DC

## 2016-11-22 NOTE — Patient Instructions (Addendum)
Routine lab work today. Will notify you of abnormal results, otherwise no news is good news!  HOLD Lasix for two days. Then resume at reduced dose of 40 mg twice daily. May cut current 80 mg tablets in half (Take half tablet twice daily). New Rx has been sent to your pharmacy for 40 mg tablets (Take 1 tablet twice daily).  Follow up 6 weeks.  Take all medication as prescribed the day of your appointment. Bring all medications with you to your appointment.  Do the following things EVERYDAY: 1) Weigh yourself in the morning before breakfast. Write it down and keep it in a log. 2) Take your medicines as prescribed 3) Eat low salt foods-Limit salt (sodium) to 2000 mg per day.  4) Stay as active as you can everyday 5) Limit all fluids for the day to less than 2 liters

## 2016-11-22 NOTE — Progress Notes (Signed)
Patient ID: Victoria Adams, female   DOB: 17-Aug-1940, 76 y.o.   MRN: [id number]     Advanced Heart Failure Clinic Note   PCP: Dr. Gar Ponto ([facility] in [place]) Primary Cardiologist: Dr. Domenic Polite EP: Dr Caryl Comes  HF: Dr. Haroldine Laws   HPI: Victoria Adams is a 76 yo morbidly obese female with a history of chronic anemia, HTN, chronic A-Fib, nonischemic cardiomyopathy, poorly-controlled DM2, CAD, chronic back pain and chronic systolic HF.   Admitted to the hospital 5/13-5/27/15 for A/C HF and NSTEMI. Troponin peaked at 6.7 and was taken for cath 08/24/13 showing moderate diffuse mid LAD and septal perforator branck with ostial ~90% occluded. Treated medically. History of chronic anemia and Hgb continued to drop and GI consulted and they did EGD showing no evidence of bleeding or pathology. Cr started rising and PICC was placed for CVP monitoring and co-oxs. She was placed on milrinone and once weaned off co-ox remained marginal in the mid 46s. Discharge weight 236 lbs.   S/p CRT-D placement.1/18   Was admitted in 3/18 for flu-like symptoms accompanied by ARF and hyponatremia. Creatinine peaked at 2.1. Also diagnosed with gout.   Today she returns for HF follow up. Overall feeling. Dizzy when standing. Says she received IV fluids at [facility] on 8/2. Weight at home 271 pounds. Not weighing a daily basis. Appetite poor. Taking all medications.    S/P  Medtronic CRT-D placement 04/16/16  ECHO (08/2013) EF 20-25%, RV normal            (02/2014) EF 25-30%, RV normal           (09/2014) EF 20-25% RV mildly dilated.            (02/2016) EF 20-25% RV normal            (06/2016)  Echo 06/15/16 EF 60-65%  Labs: (09/11/13): Creatinine 1.3, K 4.2, BUN 29, dig level 0.4 (09/20/13) Creatinine 1.36 K 4.9  (10/03/13) K 5.8, creatinine 1.67, BUN 51 (10/11/13) K 5.0, creatinine 1.21, BUN 26 (11/26/13) K 3.7 Creatinine 1.0  01/10/14 K 4.2 Cr 1.07  (02/27/14) K 4.0, creatinine 1.01 (12/15) K 4.1, creatinine 1.14 (04/16/2014) K  4.0 Creatinine 1.29 Hgb 13.8  (09/13/2014): K 4.0 Creatinine 1.53  (12/26/2014) K 3.1 Creatinuine 2.46  (08/01/2016): K 3.7 Creatininie 1.52   SH: Lives in [place] with son and daughter, no ETOH and does not smoke, retired  FH: Mother deceased: CAD?, HTN, lung cancer, afib        Father deceased: DM2, lung cancer, HTN, CAD stents        3 daughters (1 has afib)        1 son: healthy and alive  ROS: All systems negative except as listed in HPI, PMH and Problem List.  Past Medical History:  Diagnosis Date  . Allergic rhinitis   . 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 Atrial fibrillation ([place])   . Chronic low back pain    Secondary to DJD  . Chronic systolic heart failure (HCC)    a. echo (5/15):  mod LVH, EF 20-25%, diff HK, severe LAE, mild RAE  . Coronary atherosclerosis-non obstructive    LHC (5/15):  EF 40-45% global HK; long LAD 40-60%, ostial 1st major septal perf 80-90%, ostial CFX ?%, mid AVCFX extensive Ca2+, dist PDA 50%  . Depression   . Essential hypertension, benign   . Fatty liver disease, nonalcoholic   . GERD (gastroesophageal reflux disease)   .  Iron deficiency anemia   . Mitral regurgitation   . NICM (nonischemic cardiomyopathy) ([place])    a. echo (5/15):  mod LVH, EF 20-25%, diff HK, severe LAE, mild RAE  . NSTEMI (non-ST elevated myocardial infarction) ([place]) may 2015   No CAD  . Obesity   . Osteopenia   . PONV (postoperative nausea and vomiting)   . Rosacea   . Type 2 diabetes mellitus ([place])   . Vitamin D deficiency     Current Outpatient Prescriptions  Medication Sig Dispense Refill  . Calcium Carb-Cholecalciferol (CALCIUM 600 + D PO) Take 1 tablet by mouth 2 (two) times daily.     . carvedilol (COREG) 25 MG tablet TAKE (1) TABLET TWICE DAILY. 60 tablet 6  . colchicine 0.6 MG tablet Take 1 tablet (0.6 mg total) by mouth daily. 30 tablet 0  . dimenhyDRINATE (DRAMAMINE) 50 MG tablet Take 50 mg by mouth every 8 (eight) hours as needed for nausea.    Marland Kitchen doxycycline  (VIBRAMYCIN) 100 MG capsule Take 100-200 mg by mouth 2 (two) times daily as needed (rosacea).     Marland Kitchen ELIQUIS 5 MG TABS tablet TAKE (1) TABLET TWICE DAILY. 60 tablet 3  . ENTRESTO 24-26 MG TAKE (1) TABLET TWICE DAILY. 60 tablet 6  . ferrous sulfate 325 (65 FE) MG tablet TAKE 1 TABLET ONCE DAILY WITH BREAKFAST. 30 tablet 0  . FLUoxetine (PROZAC) 40 MG capsule Take 40 mg by mouth daily.    . furosemide (LASIX) 80 MG tablet Take 80 mg by mouth 2 (two) times daily.    . insulin glargine (LANTUS) 100 UNIT/ML injection Inject 0.6-1 mLs (60-100 Units total) into the skin 2 (two) times daily. 100 units in the morning and 60 units in the evening 10 mL 11  . metoprolol succinate (TOPROL-XL) 25 MG 24 hr tablet TAKE (1) TABLET BY MOUTH ONCE DAILY. 30 tablet 9  . metroNIDAZOLE (METROGEL) 0.75 % gel Apply 1 application topically 2 (two) times daily as needed (rosacea).    . mometasone (ELOCON) 0.1 % lotion Apply 1 application topically daily as needed (eczema). EAR ECZEMA    . omeprazole (PRILOSEC) 20 MG capsule Take 20 mg by mouth 2 (two) times daily before a meal.    . ondansetron (ZOFRAN) 8 MG tablet Take by mouth every 8 (eight) hours as needed for nausea or vomiting.    . polyethylene glycol (MIRALAX / GLYCOLAX) packet Take 17 g by mouth daily.    . rosuvastatin (CRESTOR) 20 MG tablet TAKE 1 TABLET ONCE A DAY AT 6 PM. 30 tablet 6  . spironolactone (ALDACTONE) 25 MG tablet Take 12.5 mg by mouth daily.     No current facility-administered medications for this encounter.      Vitals:   11/22/16 1527  SpO2: 98%  Weight: 270 lb 4 oz (122.6 kg)  Heart Rate 78 Wt Readings from Last 3 Encounters:  11/22/16 270 lb 4 oz (122.6 kg)  07/19/16 273 lb (123.8 kg)  07/12/16 272 lb (123.4 kg)    Sitting  106/ 72  Standing 86/55   PHYSICAL EXAM: General:  Well appearing. No resp difficulty HEENT: normal Neck: supple. no JVD. Carotids 2+ bilat; no bruits. No lymphadenopathy or thryomegaly appreciated. Cor:  PMI nondisplaced. Regular rate & rhythm. No rubs, gallops or murmurs. L upper chest scar from ICD.  Lungs: clear Abdomen: soft, nontender, nondistended. No hepatosplenomegaly. No bruits or masses. Good bowel sounds. Extremities: no cyanosis, clubbing, rash, edema Neuro: alert & orientedx3, cranial  nerves grossly intact. moves all 4 extremities w/o difficulty. Affect pleasant   ASSESSMENT & PLAN: 1) Chronic systolic HF: NICM, EF 15-18%, RV nl (02/2014).  ECHO 09/2014 EF 20-25%. ECHO 01/2016 EF 20-25%.  - s/p CRT-D 1/18 -> Echo 3/18 EF 60-65% EF recovered with CRT-D - Chronic NYHA III symptoms.  - Volume status low. Hold lasix x2 days. Cut lasix back to 40 mg twice a day.  - Continue coreg 25 mg twice a day - Continue Entresto 24/26 mg BID.  - Continue spironolactone 25 mg daily.   -Check BMET/BNP today   - Reinforced fluid restriction to < 2 L daily, sodium restriction to less than 2000 mg daily, and the importance of daily weights.    2) CAD: Moderate diffuse mid LAD disease of 40-60%. Septal perforator branch with ostial ~90%.  -No CP . Continue statin and bb.   3) CKD stage III: Check BMET today.  4) Afib: Chronic with rate control. - Continue eliquis 5 mg BID.   5) Falls No recent falls.  6) LBBB- EKG 01/2016 QRS 160 ms  - s/p CRT-D 1/18 7) HTN - low today.   Dry today. Allow weight to drift up from 271 to 276 pounds. Hold lasix for 2 days then cut back.    Follow up in 6 weeks.   Greater than 50% of the (total minutes 25) visit spent in counseling/coordination of care regarding daily weights, low salt diet, and limiting fluid intake to < 2 liters per day.   Darrick Grinder, NP  11/22/2016

## 2016-11-25 ENCOUNTER — Telehealth (HOSPITAL_COMMUNITY): Payer: Self-pay | Admitting: Cardiology

## 2016-11-25 MED ORDER — POTASSIUM CHLORIDE ER 20 MEQ PO TBCR: 40.0000 meq | EXTENDED_RELEASE_TABLET | Freq: Once | ORAL | 0 refills | Status: DC

## 2016-11-25 NOTE — Telephone Encounter (Signed)
Patient aware. Patient voiced understanding. Patient reports she is NOT taking potassium. Will verify with provider regarding K. Pt will need to take 40 meq x 1, pt aware and voiced understanding

## 2016-11-25 NOTE — Telephone Encounter (Signed)
-----   Message from Conrad Velma, NP sent at 11/23/2016 11:59 AM EDT ----- Hold torsemide for 2 days and spiro. K low. Aks her to take 40 meq of potassium today. Repeat BMET next week.

## 2016-12-01 ENCOUNTER — Other Ambulatory Visit: Payer: Self-pay | Admitting: Internal Medicine

## 2016-12-02 DIAGNOSIS — I5020 Unspecified systolic (congestive) heart failure: Secondary | ICD-10-CM | POA: Diagnosis not present

## 2016-12-03 ENCOUNTER — Encounter (HOSPITAL_COMMUNITY): Payer: Self-pay | Admitting: *Deleted

## 2016-12-03 ENCOUNTER — Other Ambulatory Visit (HOSPITAL_COMMUNITY): Payer: Self-pay | Admitting: Cardiology

## 2016-12-03 NOTE — Progress Notes (Signed)
Received FMLA paper for family member of patient.  Forms completed and faxed today to [phone].   Original forms will be scanned to patient's electronic medical record.

## 2016-12-25 DIAGNOSIS — I110 Hypertensive heart disease with heart failure: Secondary | ICD-10-CM | POA: Diagnosis not present

## 2016-12-25 DIAGNOSIS — R109 Unspecified abdominal pain: Secondary | ICD-10-CM | POA: Diagnosis not present

## 2016-12-25 DIAGNOSIS — E7800 Pure hypercholesterolemia, unspecified: Secondary | ICD-10-CM | POA: Diagnosis not present

## 2016-12-25 DIAGNOSIS — Z950 Presence of cardiac pacemaker: Secondary | ICD-10-CM | POA: Diagnosis not present

## 2016-12-25 DIAGNOSIS — K219 Gastro-esophageal reflux disease without esophagitis: Secondary | ICD-10-CM | POA: Diagnosis not present

## 2016-12-25 DIAGNOSIS — R1084 Generalized abdominal pain: Secondary | ICD-10-CM | POA: Diagnosis not present

## 2016-12-25 DIAGNOSIS — K521 Toxic gastroenteritis and colitis: Secondary | ICD-10-CM | POA: Diagnosis not present

## 2016-12-25 DIAGNOSIS — Z7902 Long term (current) use of antithrombotics/antiplatelets: Secondary | ICD-10-CM | POA: Diagnosis not present

## 2016-12-25 DIAGNOSIS — I252 Old myocardial infarction: Secondary | ICD-10-CM | POA: Diagnosis not present

## 2016-12-25 DIAGNOSIS — I509 Heart failure, unspecified: Secondary | ICD-10-CM | POA: Diagnosis not present

## 2016-12-25 DIAGNOSIS — A079 Protozoal intestinal disease, unspecified: Secondary | ICD-10-CM | POA: Diagnosis not present

## 2016-12-25 DIAGNOSIS — E119 Type 2 diabetes mellitus without complications: Secondary | ICD-10-CM | POA: Diagnosis not present

## 2016-12-25 DIAGNOSIS — Z90710 Acquired absence of both cervix and uterus: Secondary | ICD-10-CM | POA: Diagnosis not present

## 2016-12-25 DIAGNOSIS — F329 Major depressive disorder, single episode, unspecified: Secondary | ICD-10-CM | POA: Diagnosis not present

## 2016-12-25 DIAGNOSIS — Z794 Long term (current) use of insulin: Secondary | ICD-10-CM | POA: Diagnosis not present

## 2016-12-25 DIAGNOSIS — T43225A Adverse effect of selective serotonin reuptake inhibitors, initial encounter: Secondary | ICD-10-CM | POA: Diagnosis not present

## 2016-12-25 DIAGNOSIS — Z9049 Acquired absence of other specified parts of digestive tract: Secondary | ICD-10-CM | POA: Diagnosis not present

## 2016-12-25 DIAGNOSIS — Z79899 Other long term (current) drug therapy: Secondary | ICD-10-CM | POA: Diagnosis not present

## 2016-12-25 DIAGNOSIS — I4891 Unspecified atrial fibrillation: Secondary | ICD-10-CM | POA: Diagnosis not present

## 2016-12-27 DIAGNOSIS — R300 Dysuria: Secondary | ICD-10-CM | POA: Diagnosis not present

## 2016-12-27 DIAGNOSIS — R197 Diarrhea, unspecified: Secondary | ICD-10-CM | POA: Diagnosis not present

## 2016-12-27 DIAGNOSIS — R1013 Epigastric pain: Secondary | ICD-10-CM | POA: Diagnosis not present

## 2016-12-27 DIAGNOSIS — Z6841 Body Mass Index (BMI) 40.0 and over, adult: Secondary | ICD-10-CM | POA: Diagnosis not present

## 2016-12-29 DIAGNOSIS — R300 Dysuria: Secondary | ICD-10-CM | POA: Diagnosis not present

## 2016-12-29 DIAGNOSIS — R197 Diarrhea, unspecified: Secondary | ICD-10-CM | POA: Diagnosis not present

## 2017-01-03 ENCOUNTER — Encounter (HOSPITAL_COMMUNITY): Payer: Medicare Other

## 2017-01-12 ENCOUNTER — Encounter (HOSPITAL_COMMUNITY): Payer: Medicare Other

## 2017-01-13 ENCOUNTER — Ambulatory Visit (HOSPITAL_COMMUNITY)
Admission: RE | Admit: 2017-01-13 | Discharge: 2017-01-13 | Disposition: A | Discharge status: Home / Self Care | Payer: Medicare Other | Source: Ambulatory Visit | Attending: Cardiology | Admitting: Cardiology

## 2017-01-13 ENCOUNTER — Encounter (HOSPITAL_COMMUNITY): Payer: Self-pay

## 2017-01-13 VITALS — BP 126/74 | HR 74 | Wt 266.6 lb

## 2017-01-13 DIAGNOSIS — K219 Gastro-esophageal reflux disease without esophagitis: Secondary | ICD-10-CM | POA: Insufficient documentation

## 2017-01-13 DIAGNOSIS — E559 Vitamin D deficiency, unspecified: Secondary | ICD-10-CM | POA: Diagnosis not present

## 2017-01-13 DIAGNOSIS — D649 Anemia, unspecified: Secondary | ICD-10-CM | POA: Diagnosis not present

## 2017-01-13 DIAGNOSIS — Z794 Long term (current) use of insulin: Secondary | ICD-10-CM | POA: Insufficient documentation

## 2017-01-13 DIAGNOSIS — G8929 Other chronic pain: Secondary | ICD-10-CM | POA: Insufficient documentation

## 2017-01-13 DIAGNOSIS — I429 Cardiomyopathy, unspecified: Secondary | ICD-10-CM | POA: Diagnosis not present

## 2017-01-13 DIAGNOSIS — Z8249 Family history of ischemic heart disease and other diseases of the circulatory system: Secondary | ICD-10-CM | POA: Insufficient documentation

## 2017-01-13 DIAGNOSIS — E785 Hyperlipidemia, unspecified: Secondary | ICD-10-CM

## 2017-01-13 DIAGNOSIS — I130 Hypertensive heart and chronic kidney disease with heart failure and stage 1 through stage 4 chronic kidney disease, or unspecified chronic kidney disease: Secondary | ICD-10-CM | POA: Insufficient documentation

## 2017-01-13 DIAGNOSIS — I4891 Unspecified atrial fibrillation: Secondary | ICD-10-CM

## 2017-01-13 DIAGNOSIS — M8580 Other specified disorders of bone density and structure, unspecified site: Secondary | ICD-10-CM | POA: Insufficient documentation

## 2017-01-13 DIAGNOSIS — I482 Chronic atrial fibrillation: Secondary | ICD-10-CM | POA: Insufficient documentation

## 2017-01-13 DIAGNOSIS — I10 Essential (primary) hypertension: Secondary | ICD-10-CM

## 2017-01-13 DIAGNOSIS — Z7901 Long term (current) use of anticoagulants: Secondary | ICD-10-CM | POA: Diagnosis not present

## 2017-01-13 DIAGNOSIS — N183 Chronic kidney disease, stage 3 unspecified: Secondary | ICD-10-CM

## 2017-01-13 DIAGNOSIS — E1122 Type 2 diabetes mellitus with diabetic chronic kidney disease: Secondary | ICD-10-CM | POA: Diagnosis not present

## 2017-01-13 DIAGNOSIS — I2510 Atherosclerotic heart disease of native coronary artery without angina pectoris: Secondary | ICD-10-CM | POA: Diagnosis not present

## 2017-01-13 DIAGNOSIS — N179 Acute kidney failure, unspecified: Secondary | ICD-10-CM

## 2017-01-13 DIAGNOSIS — M549 Dorsalgia, unspecified: Secondary | ICD-10-CM | POA: Insufficient documentation

## 2017-01-13 DIAGNOSIS — I447 Left bundle-branch block, unspecified: Secondary | ICD-10-CM | POA: Diagnosis not present

## 2017-01-13 DIAGNOSIS — K760 Fatty (change of) liver, not elsewhere classified: Secondary | ICD-10-CM | POA: Insufficient documentation

## 2017-01-13 DIAGNOSIS — I5022 Chronic systolic (congestive) heart failure: Secondary | ICD-10-CM | POA: Diagnosis not present

## 2017-01-13 DIAGNOSIS — I252 Old myocardial infarction: Secondary | ICD-10-CM | POA: Insufficient documentation

## 2017-01-13 DIAGNOSIS — M109 Gout, unspecified: Secondary | ICD-10-CM | POA: Insufficient documentation

## 2017-01-13 NOTE — Progress Notes (Signed)
Patient ID: Victoria Adams, female   DOB: Nov 30, 1940, 76 y.o.   MRN: [id number]     Advanced Heart Failure Clinic Note   PCP: Dr. Gar Ponto ([facility] in [place]) Primary Cardiologist: Dr. Domenic Polite EP: Dr Caryl Comes  HF: Dr. Haroldine Laws   HPI: [name] is a 76 yo morbidly obese female with a history of chronic anemia, HTN, chronic A-Fib, nonischemic cardiomyopathy, poorly-controlled DM2, CAD, chronic back pain and chronic systolic HF.   Admitted to the hospital 5/13-5/27/15 for A/C HF and NSTEMI. Troponin peaked at 6.7 and was taken for cath 08/24/13 showing moderate diffuse mid LAD and septal perforator branck with ostial ~90% occluded. Treated medically. History of chronic anemia and Hgb continued to drop and GI consulted and they did EGD showing no evidence of bleeding or pathology. Cr started rising and PICC was placed for CVP monitoring and co-oxs. She was placed on milrinone and once weaned off co-ox remained marginal in the mid 35s. Discharge weight 236 lbs.   S/p CRT-D placement.1/18   Was admitted in 3/18 for flu-like symptoms accompanied by ARF and hyponatremia. Creatinine peaked at 2.1. Also diagnosed with gout.   She presents today for follow up. She is feeling OK overall. Remains SOB with moderate activity, and with ADLs when her fluid is up. Pt never returned for labwork after last visit, but states she had repeat labs at [facility] that were "normal" (we never received). Appetite OK. Taking all medications as directed. Weight down at home to 260s. She denies dizziness or lightheadedness. No CP or palpitations.   Optivol personally reviewed: thoracic impedence below threshold, but moving towards normal. Pt activity 1-2 hrs daily.  S/P  Medtronic CRT-D placement 04/16/16  ECHO (08/2013) EF 20-25%, RV normal            (02/2014) EF 25-30%, RV normal           (09/2014) EF 20-25% RV mildly dilated.            (02/2016) EF 20-25% RV normal            (06/2016)  Echo 06/15/16 EF  60-65%  Labs: (09/11/13): Creatinine 1.3, K 4.2, BUN 29, dig level 0.4 (09/20/13) Creatinine 1.36 K 4.9  (10/03/13) K 5.8, creatinine 1.67, BUN 51 (10/11/13) K 5.0, creatinine 1.21, BUN 26 (11/26/13) K 3.7 Creatinine 1.0  01/10/14 K 4.2 Cr 1.07  (02/27/14) K 4.0, creatinine 1.01 (12/15) K 4.1, creatinine 1.14 (04/16/2014) K 4.0 Creatinine 1.29 Hgb 13.8  (09/13/2014): K 4.0 Creatinine 1.53  (12/26/2014) K 3.1 Creatinuine 2.46  (08/01/2016): K 3.7 Creatininie 1.52   SH: Lives in [place] with son and daughter, no ETOH and does not smoke, retired  FH: Mother deceased: CAD?, HTN, lung cancer, afib        Father deceased: DM2, lung cancer, HTN, CAD stents        3 daughters (1 has afib)        1 son: healthy and alive  Review of systems complete and found to be negative unless listed in HPI.    Past Medical History:  Diagnosis Date  . Allergic rhinitis   . 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 Atrial fibrillation ([place])   . Chronic low back pain    Secondary to DJD  . Chronic systolic heart failure (HCC)    a. echo (5/15):  mod LVH, EF 20-25%, diff HK, severe LAE, mild RAE  . Coronary atherosclerosis-non obstructive    LHC (5/15):  EF 40-45% global HK; long LAD 40-60%, ostial 1st major septal perf 80-90%, ostial CFX ?%, mid AVCFX extensive Ca2+, dist PDA 50%  . Depression   . Essential hypertension, benign   . Fatty liver disease, nonalcoholic   . GERD (gastroesophageal reflux disease)   . Iron deficiency anemia   . Mitral regurgitation   . NICM (nonischemic cardiomyopathy) ([place])    a. echo (5/15):  mod LVH, EF 20-25%, diff HK, severe LAE, mild RAE  . NSTEMI (non-ST elevated myocardial infarction) ([place]) may 2015   No CAD  . Obesity   . Osteopenia   . PONV (postoperative nausea and vomiting)   . Rosacea   . Type 2 diabetes mellitus ([place])   . Vitamin D deficiency     Current Outpatient Prescriptions  Medication Sig Dispense Refill  . Calcium Carb-Cholecalciferol (CALCIUM 600 + D PO) Take 1  tablet by mouth 2 (two) times daily.     . carvedilol (COREG) 25 MG tablet TAKE (1) TABLET TWICE DAILY. 60 tablet 6  . colchicine 0.6 MG tablet Take 1 tablet (0.6 mg total) by mouth daily. 30 tablet 0  . dimenhyDRINATE (DRAMAMINE) 50 MG tablet Take 50 mg by mouth every 8 (eight) hours as needed for nausea.    Marland Kitchen doxycycline (VIBRAMYCIN) 100 MG capsule Take 100-200 mg by mouth 2 (two) times daily as needed (rosacea).     Marland Kitchen ELIQUIS 5 MG TABS tablet TAKE (1) TABLET TWICE DAILY. 60 tablet 3  . ENTRESTO 24-26 MG TAKE (1) TABLET TWICE DAILY. 60 tablet 6  . ferrous sulfate 325 (65 FE) MG tablet TAKE 1 TABLET ONCE DAILY WITH BREAKFAST. 30 tablet 0  . FLUoxetine (PROZAC) 40 MG capsule Take 40 mg by mouth daily.    . furosemide (LASIX) 80 MG tablet Take 80 mg by mouth 2 (two) times daily.    . insulin glargine (LANTUS) 100 UNIT/ML injection Inject 0.6-1 mLs (60-100 Units total) into the skin 2 (two) times daily. 100 units in the morning and 60 units in the evening 10 mL 11  . metoprolol succinate (TOPROL-XL) 25 MG 24 hr tablet TAKE (1) TABLET BY MOUTH ONCE DAILY. 30 tablet 9  . metroNIDAZOLE (METROGEL) 0.75 % gel Apply 1 application topically 2 (two) times daily as needed (rosacea).    . mometasone (ELOCON) 0.1 % lotion Apply 1 application topically daily as needed (eczema). EAR ECZEMA    . omeprazole (PRILOSEC) 20 MG capsule Take 20 mg by mouth 2 (two) times daily before a meal.    . ondansetron (ZOFRAN) 8 MG tablet Take by mouth every 8 (eight) hours as needed for nausea or vomiting.    . polyethylene glycol (MIRALAX / GLYCOLAX) packet Take 17 g by mouth daily.    . rosuvastatin (CRESTOR) 20 MG tablet TAKE 1 TABLET ONCE A DAY AT 6 PM. 30 tablet 6  . spironolactone (ALDACTONE) 25 MG tablet Take 12.5 mg by mouth daily.     No current facility-administered medications for this encounter.      Vitals:   01/13/17 1502  BP: 126/74  Pulse: 74  SpO2: 98%  Weight: 266 lb 9.6 oz (120.9 kg)   Wt Readings  from Last 3 Encounters:  01/13/17 266 lb 9.6 oz (120.9 kg)  11/22/16 270 lb 4 oz (122.6 kg)  07/19/16 273 lb (123.8 kg)    PHYSICAL EXAM:  General: Elderly appearing. No resp difficulty. HEENT: Normal Neck: Supple. JVP 7-8 cm.. Carotids 2+ bilat; no bruits. No thyromegaly or  nodule noted. Cor: PMI nondisplaced. RRR, No M/G/R noted. L upper chest ICD.  Lungs: CTAB, normal effort. Abdomen: Soft, non-tender, non-distended, no HSM. No bruits or masses. +BS  Extremities: No cyanosis, clubbing, rash, R and LLE no edema.  Neuro: Alert & orientedx3, cranial nerves grossly intact. moves all 4 extremities w/o difficulty. Affect pleasant   ASSESSMENT & PLAN: 1) Chronic systolic HF: NICM, EF 40-34%, RV nl (02/2014).  ECHO 09/2014 EF 20-25%. ECHO 01/2016 EF 20-25%.  - s/p CRT-D 1/18 -> Echo 3/18 EF 60-65% EF recovered with CRT-D - Chronic NYHA III symptoms. Stable.  - Volume status looks OK on exam. She has gone back to taking lasix 80 mg BID. Will leave here for now and check BMET.  - Continue coreg 25 mg BID.  - Continue Entresto 24/26 mg BID. Needs repeat BMET prior to up-titration.   - Continue spironolactone 12.5 mg daily.   - Reinforced fluid restriction to < 2 L daily, sodium restriction to less than 2000 mg daily, and the importance of daily weights.   2) CAD: Moderate diffuse mid LAD disease of 40-60%. Septal perforator branch with ostial ~90%.  - No s/s of ischemia.   Continue statin and bb.   3) Recent AKI on CKD stage III:  - Recent AKI. She states she had recheck labs that were OK, but we never received. BMET today.   4) Afib: Chronic with rate control. - Continue eliquis 5 mg BID. Denies bleeding.  5) Falls - No recent falls.  6) LBBB- EKG 01/2016 QRS 160 ms  - s/p CRT-D 1/18 7) HTN - Stable on current meds. Room to up-titrate, but with recent AKI needs repeat labs first.   RTC 4 weeks for med titration and follow up. MD visit 8 weeks. Labs today to decide on med titration  with recent AKI.    Shirley Friar, PA-C  01/13/2017   Greater than 50% of the 25 minute visit was spent in counseling/coordination of care regarding disease state education, sliding scale diuretics, salt/fluid restriction, and medication reconciliation.

## 2017-01-13 NOTE — Patient Instructions (Signed)
Labs today We will only contact you if something comes back abnormal or we need to make some changes. Otherwise no news is good news!   Your physician recommends that you schedule a follow-up appointment in: 4 weeks with Rebecca Eaton  Your physician recommends that you schedule a follow-up appointment in: 8 weeks with Dr Haroldine Laws   Do the following things EVERYDAY: 1) Weigh yourself in the morning before breakfast. Write it down and keep it in a log. 2) Take your medicines as prescribed 3) Eat low salt foods-Limit salt (sodium) to 2000 mg per day.  4) Stay as active as you can everyday 5) Limit all fluids for the day to less than 2 liters

## 2017-01-14 ENCOUNTER — Telehealth (HOSPITAL_COMMUNITY): Payer: Self-pay | Admitting: Cardiology

## 2017-01-14 ENCOUNTER — Other Ambulatory Visit (HOSPITAL_COMMUNITY): Payer: Self-pay | Admitting: Internal Medicine

## 2017-01-14 NOTE — Telephone Encounter (Signed)
Labs ordered 01/13/17, specimen never received by lab. CHF phlebotomist confirms stick. [place] Patient will need repeat labs ASAP  Patient aware and reports she is unable to have them done today, earliest she can go is Monday 01/17/17.  Order faxed to [facility]  Fax # [phone]

## 2017-01-17 ENCOUNTER — Encounter: Payer: Self-pay | Admitting: Gastroenterology

## 2017-01-17 ENCOUNTER — Ambulatory Visit (INDEPENDENT_AMBULATORY_CARE_PROVIDER_SITE_OTHER): Payer: Medicare Other | Admitting: *Deleted

## 2017-01-17 DIAGNOSIS — I428 Other cardiomyopathies: Secondary | ICD-10-CM

## 2017-01-17 DIAGNOSIS — I5022 Chronic systolic (congestive) heart failure: Secondary | ICD-10-CM | POA: Diagnosis not present

## 2017-01-18 LAB — CUP PACEART REMOTE DEVICE CHECK
Battery Voltage: 3.02 V
Brady Statistic AP VP Percent: 0 %
Brady Statistic AP VS Percent: 0 %
Brady Statistic AS VS Percent: 0 %
Date Time Interrogation Session: 20181008101803
HIGH POWER IMPEDANCE MEASURED VALUE: 89 Ohm
Implantable Lead Implant Date: 20180105
Implantable Lead Implant Date: 20180105
Implantable Lead Location: 753858
Implantable Lead Model: 4598
Lead Channel Impedance Value: 184.154
Lead Channel Impedance Value: 194.634
Lead Channel Impedance Value: 218.298
Lead Channel Impedance Value: 380 Ohm
Lead Channel Impedance Value: 494 Ohm
Lead Channel Impedance Value: 513 Ohm
Lead Channel Impedance Value: 665 Ohm
Lead Channel Impedance Value: 665 Ohm
Lead Channel Impedance Value: 760 Ohm
Lead Channel Impedance Value: 760 Ohm
Lead Channel Impedance Value: 760 Ohm
Lead Channel Pacing Threshold Amplitude: 0.5 V
Lead Channel Pacing Threshold Amplitude: 0.5 V
Lead Channel Pacing Threshold Pulse Width: 0.8 ms
Lead Channel Sensing Intrinsic Amplitude: 14.5 mV
Lead Channel Setting Pacing Amplitude: 1 V
Lead Channel Setting Pacing Pulse Width: 0.4 ms
Lead Channel Setting Pacing Pulse Width: 0.8 ms
Lead Channel Setting Sensing Sensitivity: 0.3 mV
MDC IDC LEAD LOCATION: 753860
MDC IDC MSMT BATTERY REMAINING LONGEVITY: 103 mo
MDC IDC MSMT LEADCHNL LV IMPEDANCE VALUE: 205.2 Ohm
MDC IDC MSMT LEADCHNL LV IMPEDANCE VALUE: 224.438
MDC IDC MSMT LEADCHNL LV IMPEDANCE VALUE: 342 Ohm
MDC IDC MSMT LEADCHNL LV IMPEDANCE VALUE: 399 Ohm
MDC IDC MSMT LEADCHNL LV IMPEDANCE VALUE: 646 Ohm
MDC IDC MSMT LEADCHNL LV IMPEDANCE VALUE: 779 Ohm
MDC IDC MSMT LEADCHNL RA IMPEDANCE VALUE: 4047 Ohm
MDC IDC MSMT LEADCHNL RV PACING THRESHOLD PULSEWIDTH: 0.4 ms
MDC IDC MSMT LEADCHNL RV SENSING INTR AMPL: 14.5 mV
MDC IDC PG IMPLANT DT: 20180105
MDC IDC SET LEADCHNL RV PACING AMPLITUDE: 2 V
MDC IDC STAT BRADY AS VP PERCENT: 0 %
MDC IDC STAT BRADY RA PERCENT PACED: 0 %
MDC IDC STAT BRADY RV PERCENT PACED: 90.11 %

## 2017-01-18 NOTE — Progress Notes (Signed)
Remote ICD transmission.   

## 2017-01-21 ENCOUNTER — Encounter: Payer: Self-pay | Admitting: Cardiology

## 2017-02-04 ENCOUNTER — Encounter: Payer: Self-pay | Admitting: Cardiology

## 2017-02-09 ENCOUNTER — Encounter (HOSPITAL_COMMUNITY): Payer: Medicare Other

## 2017-02-16 ENCOUNTER — Ambulatory Visit (HOSPITAL_COMMUNITY)
Admission: RE | Admit: 2017-02-16 | Discharge: 2017-02-16 | Disposition: A | Discharge status: Home / Self Care | Payer: Medicare Other | Source: Ambulatory Visit | Attending: Internal Medicine | Admitting: Internal Medicine

## 2017-02-16 ENCOUNTER — Encounter (HOSPITAL_COMMUNITY): Payer: Self-pay

## 2017-02-16 VITALS — BP 110/80 | HR 84 | Wt 260.2 lb

## 2017-02-16 DIAGNOSIS — I428 Other cardiomyopathies: Secondary | ICD-10-CM | POA: Diagnosis not present

## 2017-02-16 DIAGNOSIS — I447 Left bundle-branch block, unspecified: Secondary | ICD-10-CM | POA: Diagnosis not present

## 2017-02-16 DIAGNOSIS — I429 Cardiomyopathy, unspecified: Secondary | ICD-10-CM | POA: Insufficient documentation

## 2017-02-16 DIAGNOSIS — I5022 Chronic systolic (congestive) heart failure: Secondary | ICD-10-CM | POA: Diagnosis not present

## 2017-02-16 DIAGNOSIS — Z8249 Family history of ischemic heart disease and other diseases of the circulatory system: Secondary | ICD-10-CM | POA: Diagnosis not present

## 2017-02-16 DIAGNOSIS — Z95811 Presence of heart assist device: Secondary | ICD-10-CM | POA: Insufficient documentation

## 2017-02-16 DIAGNOSIS — Z7901 Long term (current) use of anticoagulants: Secondary | ICD-10-CM | POA: Diagnosis not present

## 2017-02-16 DIAGNOSIS — E1122 Type 2 diabetes mellitus with diabetic chronic kidney disease: Secondary | ICD-10-CM | POA: Diagnosis not present

## 2017-02-16 DIAGNOSIS — F329 Major depressive disorder, single episode, unspecified: Secondary | ICD-10-CM | POA: Insufficient documentation

## 2017-02-16 DIAGNOSIS — E559 Vitamin D deficiency, unspecified: Secondary | ICD-10-CM | POA: Insufficient documentation

## 2017-02-16 DIAGNOSIS — I340 Nonrheumatic mitral (valve) insufficiency: Secondary | ICD-10-CM | POA: Diagnosis not present

## 2017-02-16 DIAGNOSIS — I2510 Atherosclerotic heart disease of native coronary artery without angina pectoris: Secondary | ICD-10-CM | POA: Insufficient documentation

## 2017-02-16 DIAGNOSIS — M8580 Other specified disorders of bone density and structure, unspecified site: Secondary | ICD-10-CM | POA: Insufficient documentation

## 2017-02-16 DIAGNOSIS — M549 Dorsalgia, unspecified: Secondary | ICD-10-CM | POA: Insufficient documentation

## 2017-02-16 DIAGNOSIS — Z79899 Other long term (current) drug therapy: Secondary | ICD-10-CM | POA: Insufficient documentation

## 2017-02-16 DIAGNOSIS — K219 Gastro-esophageal reflux disease without esophagitis: Secondary | ICD-10-CM | POA: Insufficient documentation

## 2017-02-16 DIAGNOSIS — Z794 Long term (current) use of insulin: Secondary | ICD-10-CM | POA: Diagnosis not present

## 2017-02-16 DIAGNOSIS — K760 Fatty (change of) liver, not elsewhere classified: Secondary | ICD-10-CM | POA: Diagnosis not present

## 2017-02-16 DIAGNOSIS — I4821 Permanent atrial fibrillation: Secondary | ICD-10-CM

## 2017-02-16 DIAGNOSIS — I482 Chronic atrial fibrillation: Secondary | ICD-10-CM | POA: Diagnosis not present

## 2017-02-16 DIAGNOSIS — I130 Hypertensive heart and chronic kidney disease with heart failure and stage 1 through stage 4 chronic kidney disease, or unspecified chronic kidney disease: Secondary | ICD-10-CM | POA: Diagnosis not present

## 2017-02-16 DIAGNOSIS — D649 Anemia, unspecified: Secondary | ICD-10-CM | POA: Diagnosis not present

## 2017-02-16 DIAGNOSIS — N183 Chronic kidney disease, stage 3 (moderate): Secondary | ICD-10-CM | POA: Diagnosis not present

## 2017-02-16 DIAGNOSIS — G8929 Other chronic pain: Secondary | ICD-10-CM | POA: Diagnosis not present

## 2017-02-16 DIAGNOSIS — L719 Rosacea, unspecified: Secondary | ICD-10-CM | POA: Insufficient documentation

## 2017-02-16 DIAGNOSIS — I252 Old myocardial infarction: Secondary | ICD-10-CM | POA: Insufficient documentation

## 2017-02-16 LAB — BASIC METABOLIC PANEL
ANION GAP: 9 (ref 5–15)
BUN: 44 mg/dL — ABNORMAL HIGH (ref 6–20)
CALCIUM: 8.9 mg/dL (ref 8.9–10.3)
CO2: 28 mmol/L (ref 22–32)
Chloride: 99 mmol/L — ABNORMAL LOW (ref 101–111)
Creatinine, Ser: 1.99 mg/dL — ABNORMAL HIGH (ref 0.44–1.00)
GFR, EST AFRICAN AMERICAN: 27 mL/min — AB (ref >60)
GFR, EST NON AFRICAN AMERICAN: 24 mL/min — AB (ref >60)
GLUCOSE: 179 mg/dL — AB (ref 65–99)
Potassium: 4.6 mmol/L (ref 3.5–5.1)
Sodium: 136 mmol/L (ref 135–145)

## 2017-02-16 NOTE — Progress Notes (Signed)
Patient ID: Victoria Adams, female   DOB: Mar 22, 1941, 76 y.o.   MRN: [id number]     Advanced Heart Failure Clinic Note   PCP: Dr. Gar Ponto ([facility] in [place]) Primary Cardiologist: Dr. Domenic Polite EP: Dr Caryl Comes  HF: Dr. Haroldine Laws   HPI: Victoria Adams is a 76 yo morbidly obese female with a history of chronic anemia, HTN, chronic A-Fib, nonischemic cardiomyopathy, poorly-controlled DM2, CAD, chronic back pain, chronic systolic HF,  NTEMI, and CTR-D 2018.   Today she returns for HF follow up. Overall feeling ok. Has had ongoing issues with nausea. Has had difficulty sleeping because she is caring for her disabled daughter. Denies SOB/PND/Orthopnea. No bleeding problems. Taking all medications.     ECHO (08/2013) EF 20-25%, RV normal            (02/2014) EF 25-30%, RV normal           (09/2014) EF 20-25% RV mildly dilated.            (02/2016) EF 20-25% RV normal            (06/2016)  Echo 06/15/16 EF 60-65%  SH: Lives in [place] with son and daughter, no ETOH and does not smoke, retired  [place]: Mother deceased: CAD?, HTN, lung cancer, afib        Father deceased: DM2, lung cancer, HTN, CAD stents        3 daughters (1 has afib)        1 son: healthy and alive  ROS: All systems negative except as listed in HPI, PMH and Problem List.  Past Medical History:  Diagnosis Date  . Allergic rhinitis   . 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 Atrial fibrillation ([place])   . Chronic low back pain    Secondary to DJD  . Chronic systolic heart failure (HCC)    a. echo (5/15):  mod LVH, EF 74-25%, diff HK, severe LAE, mild RAE  . Coronary atherosclerosis-non obstructive    LHC (5/15):  EF 40-45% global HK; long LAD 40-60%, ostial 1st major septal perf 80-90%, ostial CFX ?%, mid AVCFX extensive Ca2+, dist PDA 50%  . Depression   . Essential hypertension, benign   . Fatty liver disease, nonalcoholic   . GERD (gastroesophageal reflux disease)   . Iron deficiency anemia   . Mitral regurgitation   . NICM  (nonischemic cardiomyopathy) ([place])    a. echo (5/15):  mod LVH, EF 74-25%, diff HK, severe LAE, mild RAE  . NSTEMI (non-ST elevated myocardial infarction) ([place]) may 2015   No CAD  . Obesity   . Osteopenia   . PONV (postoperative nausea and vomiting)   . Rosacea   . Type 2 diabetes mellitus ([place])   . Vitamin D deficiency     Current Outpatient Medications  Medication Sig Dispense Refill  . Calcium Carb-Cholecalciferol (CALCIUM 600 + D PO) Take 1 tablet by mouth 2 (two) times daily.     . carvedilol (COREG) 25 MG tablet TAKE (1) TABLET TWICE DAILY. 60 tablet 6  . colchicine 0.6 MG tablet Take 1 tablet (0.6 mg total) by mouth daily. 30 tablet 0  . dimenhyDRINATE (DRAMAMINE) 50 MG tablet Take 50 mg by mouth every 8 (eight) hours as needed for nausea.    Marland Kitchen doxycycline (VIBRAMYCIN) 100 MG capsule Take 100-200 mg by mouth 2 (two) times daily as needed (rosacea).     Marland Kitchen ELIQUIS 5 MG TABS tablet TAKE (1) TABLET TWICE DAILY.  60 tablet 3  . ENTRESTO 24-26 MG TAKE (1) TABLET TWICE DAILY. 60 tablet 6  . ferrous sulfate 325 (65 FE) MG tablet TAKE 1 TABLET ONCE DAILY WITH BREAKFAST. 30 tablet 0  . FLUoxetine (PROZAC) 40 MG capsule Take 40 mg by mouth daily.    . furosemide (LASIX) 80 MG tablet Take 80 mg by mouth 2 (two) times daily.    . insulin glargine (LANTUS) 100 UNIT/ML injection Inject 0.6-1 mLs (60-100 Units total) into the skin 2 (two) times daily. 100 units in the morning and 60 units in the evening 10 mL 11  . metoprolol succinate (TOPROL-XL) 25 MG 24 hr tablet TAKE (1) TABLET BY MOUTH ONCE DAILY. 30 tablet 9  . metroNIDAZOLE (METROGEL) 0.75 % gel Apply 1 application topically 2 (two) times daily as needed (rosacea).    . mometasone (ELOCON) 0.1 % lotion Apply 1 application topically daily as needed (eczema). EAR ECZEMA    . omeprazole (PRILOSEC) 20 MG capsule Take 20 mg by mouth 2 (two) times daily before a meal.    . ondansetron (ZOFRAN) 8 MG tablet Take by mouth every 8 (eight) hours as  needed for nausea or vomiting.    . polyethylene glycol (MIRALAX / GLYCOLAX) packet Take 17 g by mouth daily.    . rosuvastatin (CRESTOR) 20 MG tablet TAKE 1 TABLET ONCE A DAY AT 6 PM. 30 tablet 6  . spironolactone (ALDACTONE) 25 MG tablet Take 12.5 mg by mouth daily.     No current facility-administered medications for this encounter.      Vitals:   02/16/17 1508  BP: 110/80  Pulse: 84  SpO2: 97%  Weight: 260 lb 3.2 oz (118 kg)  Heart Rate 78 Wt Readings from Last 3 Encounters:  02/16/17 260 lb 3.2 oz (118 kg)  01/13/17 266 lb 9.6 oz (120.9 kg)  11/22/16 270 lb 4 oz (122.6 kg)   PHYSICAL EXAM: General:  Well appearing. No resp difficulty. Walked slowly in the clinic.  HEENT: normal Neck: supple. no JVD. Carotids 2+ bilat; no bruits. No lymphadenopathy or thryomegaly appreciated. Cor: PMI nondisplaced. Regular rate & rhythm. No rubs, gallops or murmurs. Lungs: clear Abdomen: obese. Soft, nontender, nondistended. No hepatosplenomegaly. No bruits or masses. Good bowel sounds. Extremities: no cyanosis, clubbing, rash, edema Neuro: alert & orientedx3, cranial nerves grossly intact. moves all 4 extremities w/o difficulty. Affect pleasant sant   ASSESSMENT & PLAN: 1) Chronic systolic HF: NICM, EF 63-14%, RV nl (02/2014).  ECHO 09/2014 EF 20-25%. ECHO 01/2016 EF 20-25%.  - s/p CRT-D 1/18 -> Echo 3/18 EF 60-65% EF recovered with CRT-D - NYHA II. Volume status stable. Continue lasix 80 mg twice a day.  -Continue toprol xl 25 mg daily - Continue Entresto 24/26 mg BID.  - Continue spironolactone 25 mg daily.   2) CAD: Moderate diffuse mid LAD disease of 40-60%. Septal perforator branch with ostial ~90%.  No S/S ischemia.  Continue statin and bb.   3) CKD stage III: Check BMET  4) Afib: Chronic with rate control. - Continue eliquis 5 mg BID.   5) LBBB-  - s/p CRT-D 1/18 6) HTN Stable  Follow up in 6 months.     Darrick Grinder, NP  02/16/2017

## 2017-02-16 NOTE — Patient Instructions (Signed)
No changes to medication at this time.  Routine lab work today. Will notify you of abnormal results, otherwise no news is good news!  Follow up 6 months with Dr. Haroldine Laws and echocardiogram.  Take all medication as prescribed the day of your appointment. Bring all medications with you to your appointment.  Do the following things EVERYDAY: 1) Weigh yourself in the morning before breakfast. Write it down and keep it in a log. 2) Take your medicines as prescribed 3) Eat low salt foods-Limit salt (sodium) to 2000 mg per day.  4) Stay as active as you can everyday 5) Limit all fluids for the day to less than 2 liters

## 2017-02-28 DIAGNOSIS — J301 Allergic rhinitis due to pollen: Secondary | ICD-10-CM | POA: Diagnosis not present

## 2017-02-28 DIAGNOSIS — K219 Gastro-esophageal reflux disease without esophagitis: Secondary | ICD-10-CM | POA: Diagnosis not present

## 2017-02-28 DIAGNOSIS — Z23 Encounter for immunization: Secondary | ICD-10-CM | POA: Diagnosis not present

## 2017-02-28 DIAGNOSIS — I2510 Atherosclerotic heart disease of native coronary artery without angina pectoris: Secondary | ICD-10-CM | POA: Diagnosis not present

## 2017-02-28 DIAGNOSIS — I5032 Chronic diastolic (congestive) heart failure: Secondary | ICD-10-CM | POA: Diagnosis not present

## 2017-02-28 DIAGNOSIS — I482 Chronic atrial fibrillation: Secondary | ICD-10-CM | POA: Diagnosis not present

## 2017-02-28 DIAGNOSIS — Z79891 Long term (current) use of opiate analgesic: Secondary | ICD-10-CM | POA: Diagnosis not present

## 2017-02-28 DIAGNOSIS — Z6841 Body Mass Index (BMI) 40.0 and over, adult: Secondary | ICD-10-CM | POA: Diagnosis not present

## 2017-02-28 DIAGNOSIS — I10 Essential (primary) hypertension: Secondary | ICD-10-CM | POA: Diagnosis not present

## 2017-02-28 DIAGNOSIS — E782 Mixed hyperlipidemia: Secondary | ICD-10-CM | POA: Diagnosis not present

## 2017-02-28 DIAGNOSIS — E1122 Type 2 diabetes mellitus with diabetic chronic kidney disease: Secondary | ICD-10-CM | POA: Diagnosis not present

## 2017-02-28 DIAGNOSIS — E6609 Other obesity due to excess calories: Secondary | ICD-10-CM | POA: Diagnosis not present

## 2017-02-28 DIAGNOSIS — G6289 Other specified polyneuropathies: Secondary | ICD-10-CM | POA: Diagnosis not present

## 2017-02-28 DIAGNOSIS — F331 Major depressive disorder, recurrent, moderate: Secondary | ICD-10-CM | POA: Diagnosis not present

## 2017-03-01 ENCOUNTER — Encounter (HOSPITAL_COMMUNITY): Payer: Self-pay | Admitting: *Deleted

## 2017-03-01 NOTE — Progress Notes (Signed)
Received FMLA paperwork for patient's daughter on 02/16/2017.  Forms completed/signed and faxed to [place] @ [phone].  Original will be scanned to patient's electronic medical record.

## 2017-03-10 ENCOUNTER — Encounter (HOSPITAL_COMMUNITY): Payer: Medicare Other | Admitting: Internal Medicine

## 2017-04-18 ENCOUNTER — Ambulatory Visit (INDEPENDENT_AMBULATORY_CARE_PROVIDER_SITE_OTHER): Payer: Medicare Other | Admitting: *Deleted

## 2017-04-18 DIAGNOSIS — I5022 Chronic systolic (congestive) heart failure: Secondary | ICD-10-CM

## 2017-04-18 DIAGNOSIS — I428 Other cardiomyopathies: Secondary | ICD-10-CM

## 2017-04-19 ENCOUNTER — Encounter: Payer: Self-pay | Admitting: Cardiology

## 2017-04-19 LAB — CUP PACEART REMOTE DEVICE CHECK
Brady Statistic AP VP Percent: 0 %
Brady Statistic AP VS Percent: 0 %
Brady Statistic AS VP Percent: 0 %
Brady Statistic AS VS Percent: 0 %
Brady Statistic RV Percent Paced: 90.05 %
HIGH POWER IMPEDANCE MEASURED VALUE: 82 Ohm
Implantable Lead Implant Date: 20180105
Implantable Lead Location: 753860
Lead Channel Impedance Value: 208.568
Lead Channel Impedance Value: 208.568
Lead Channel Impedance Value: 224.438
Lead Channel Impedance Value: 235.98 Ohm
Lead Channel Impedance Value: 437 Ohm
Lead Channel Impedance Value: 589 Ohm
Lead Channel Impedance Value: 646 Ohm
Lead Channel Impedance Value: 703 Ohm
Lead Channel Impedance Value: 836 Ohm
Lead Channel Impedance Value: 836 Ohm
Lead Channel Pacing Threshold Amplitude: 0.5 V
Lead Channel Pacing Threshold Amplitude: 0.75 V
Lead Channel Sensing Intrinsic Amplitude: 15.375 mV
Lead Channel Setting Pacing Pulse Width: 0.4 ms
Lead Channel Setting Sensing Sensitivity: 0.3 mV
MDC IDC LEAD IMPLANT DT: 20180105
MDC IDC LEAD LOCATION: 753858
MDC IDC MSMT BATTERY REMAINING LONGEVITY: 96 mo
MDC IDC MSMT BATTERY VOLTAGE: 3.01 V
MDC IDC MSMT LEADCHNL LV IMPEDANCE VALUE: 235.98 Ohm
MDC IDC MSMT LEADCHNL LV IMPEDANCE VALUE: 399 Ohm
MDC IDC MSMT LEADCHNL LV IMPEDANCE VALUE: 437 Ohm
MDC IDC MSMT LEADCHNL LV IMPEDANCE VALUE: 513 Ohm
MDC IDC MSMT LEADCHNL LV IMPEDANCE VALUE: 532 Ohm
MDC IDC MSMT LEADCHNL LV IMPEDANCE VALUE: 817 Ohm
MDC IDC MSMT LEADCHNL LV PACING THRESHOLD PULSEWIDTH: 0.8 ms
MDC IDC MSMT LEADCHNL RA IMPEDANCE VALUE: 4047 Ohm
MDC IDC MSMT LEADCHNL RV IMPEDANCE VALUE: 646 Ohm
MDC IDC MSMT LEADCHNL RV PACING THRESHOLD PULSEWIDTH: 0.4 ms
MDC IDC MSMT LEADCHNL RV SENSING INTR AMPL: 15.375 mV
MDC IDC PG IMPLANT DT: 20180105
MDC IDC SESS DTM: 20190107111807
MDC IDC SET LEADCHNL LV PACING AMPLITUDE: 1.25 V
MDC IDC SET LEADCHNL LV PACING PULSEWIDTH: 0.8 ms
MDC IDC SET LEADCHNL RV PACING AMPLITUDE: 2 V
MDC IDC STAT BRADY RA PERCENT PACED: 0 %

## 2017-04-19 NOTE — Progress Notes (Signed)
Remote ICD transmission.   

## 2017-04-27 ENCOUNTER — Encounter (HOSPITAL_COMMUNITY): Payer: Medicare Other | Admitting: Internal Medicine

## 2017-05-23 ENCOUNTER — Ambulatory Visit (HOSPITAL_COMMUNITY)
Admission: RE | Admit: 2017-05-23 | Discharge: 2017-05-23 | Disposition: A | Discharge status: Home / Self Care | Payer: Medicare Other | Source: Ambulatory Visit | Attending: Internal Medicine | Admitting: Internal Medicine

## 2017-05-23 ENCOUNTER — Encounter (HOSPITAL_COMMUNITY): Payer: Self-pay | Admitting: Internal Medicine

## 2017-05-23 ENCOUNTER — Other Ambulatory Visit: Payer: Self-pay

## 2017-05-23 VITALS — BP 130/62 | HR 87 | Wt 251.8 lb

## 2017-05-23 DIAGNOSIS — R5381 Other malaise: Secondary | ICD-10-CM

## 2017-05-23 DIAGNOSIS — K219 Gastro-esophageal reflux disease without esophagitis: Secondary | ICD-10-CM | POA: Insufficient documentation

## 2017-05-23 DIAGNOSIS — I482 Chronic atrial fibrillation: Secondary | ICD-10-CM | POA: Insufficient documentation

## 2017-05-23 DIAGNOSIS — L719 Rosacea, unspecified: Secondary | ICD-10-CM | POA: Diagnosis not present

## 2017-05-23 DIAGNOSIS — I252 Old myocardial infarction: Secondary | ICD-10-CM | POA: Diagnosis not present

## 2017-05-23 DIAGNOSIS — I447 Left bundle-branch block, unspecified: Secondary | ICD-10-CM | POA: Insufficient documentation

## 2017-05-23 DIAGNOSIS — I130 Hypertensive heart and chronic kidney disease with heart failure and stage 1 through stage 4 chronic kidney disease, or unspecified chronic kidney disease: Secondary | ICD-10-CM | POA: Diagnosis not present

## 2017-05-23 DIAGNOSIS — I2510 Atherosclerotic heart disease of native coronary artery without angina pectoris: Secondary | ICD-10-CM | POA: Diagnosis not present

## 2017-05-23 DIAGNOSIS — Z794 Long term (current) use of insulin: Secondary | ICD-10-CM | POA: Diagnosis not present

## 2017-05-23 DIAGNOSIS — I428 Other cardiomyopathies: Secondary | ICD-10-CM | POA: Insufficient documentation

## 2017-05-23 DIAGNOSIS — I4891 Unspecified atrial fibrillation: Secondary | ICD-10-CM

## 2017-05-23 DIAGNOSIS — Z8249 Family history of ischemic heart disease and other diseases of the circulatory system: Secondary | ICD-10-CM | POA: Diagnosis not present

## 2017-05-23 DIAGNOSIS — M549 Dorsalgia, unspecified: Secondary | ICD-10-CM | POA: Diagnosis not present

## 2017-05-23 DIAGNOSIS — N183 Chronic kidney disease, stage 3 unspecified: Secondary | ICD-10-CM

## 2017-05-23 DIAGNOSIS — F329 Major depressive disorder, single episode, unspecified: Secondary | ICD-10-CM | POA: Insufficient documentation

## 2017-05-23 DIAGNOSIS — Z79899 Other long term (current) drug therapy: Secondary | ICD-10-CM | POA: Diagnosis not present

## 2017-05-23 DIAGNOSIS — E1122 Type 2 diabetes mellitus with diabetic chronic kidney disease: Secondary | ICD-10-CM | POA: Insufficient documentation

## 2017-05-23 DIAGNOSIS — Z7901 Long term (current) use of anticoagulants: Secondary | ICD-10-CM | POA: Insufficient documentation

## 2017-05-23 DIAGNOSIS — E559 Vitamin D deficiency, unspecified: Secondary | ICD-10-CM | POA: Insufficient documentation

## 2017-05-23 DIAGNOSIS — K760 Fatty (change of) liver, not elsewhere classified: Secondary | ICD-10-CM | POA: Diagnosis not present

## 2017-05-23 DIAGNOSIS — Z95810 Presence of automatic (implantable) cardiac defibrillator: Secondary | ICD-10-CM | POA: Diagnosis not present

## 2017-05-23 DIAGNOSIS — I5022 Chronic systolic (congestive) heart failure: Secondary | ICD-10-CM | POA: Insufficient documentation

## 2017-05-23 DIAGNOSIS — I10 Essential (primary) hypertension: Secondary | ICD-10-CM | POA: Diagnosis not present

## 2017-05-23 LAB — BASIC METABOLIC PANEL
Anion gap: 13 (ref 5–15)
BUN: 22 mg/dL — AB (ref 6–20)
CHLORIDE: 97 mmol/L — AB (ref 101–111)
CO2: 30 mmol/L (ref 22–32)
Calcium: 9 mg/dL (ref 8.9–10.3)
Creatinine, Ser: 1.32 mg/dL — ABNORMAL HIGH (ref 0.44–1.00)
GFR calc Af Amer: 45 mL/min — ABNORMAL LOW (ref >60)
GFR calc non Af Amer: 38 mL/min — ABNORMAL LOW (ref >60)
Glucose, Bld: 83 mg/dL (ref 65–99)
POTASSIUM: 3 mmol/L — AB (ref 3.5–5.1)
Sodium: 140 mmol/L (ref 135–145)

## 2017-05-23 MED ORDER — METOLAZONE 2.5 MG PO TABS: 2.5000 mg | ORAL_TABLET | Freq: Every day | ORAL | 3 refills | Status: DC

## 2017-05-23 MED ORDER — SPIRONOLACTONE 25 MG PO TABS: 12.5000 mg | ORAL_TABLET | Freq: Every day | ORAL | 3 refills | Status: DC

## 2017-05-23 NOTE — Patient Instructions (Signed)
Start Spironolactone 12.5 mg (1/2 tab) daily  Start Metolazone 2.5 mg (1 tab) on Tuesday and then as prescribed   -Take an extra Potassium 20 meq   You have been referred to Physical Therapy   Labs drawn today (if we do not call you, then your lab work was stable)   Your physician recommends that you return for lab work in: 7-10 days Rx given   Your physician recommends that you schedule a follow-up appointment in: 5 weeks with [place], [place]

## 2017-05-23 NOTE — Progress Notes (Signed)
Patient ID: Victoria Adams, female   DOB: 1940-05-31, 77 y.o.   MRN: [id number]     Advanced Heart Failure Clinic Note   PCP: Dr. Gar Ponto ([facility] in [place]) Primary Cardiologist: Dr. Domenic Polite EP: Dr Caryl Comes  HF: Dr. Haroldine Laws   HPI: Victoria Adams is a 77 y.o. female with a history of morbid obesity, HTN, chronic A-Fib, poorly-controlled DM2, CAD, chronic back pain, chronic systolic HF due to NICM s/p CTR-D 2018.   She presents today for regular follow up. Down 9 lbs from last visit but feels she has more swelling in her ankles over the past few weeks. Since last visit has been taking off of Prozac due to N/V and off of spironolactone due to lightheadedness and hypotension. She feels better off of Prozac, but feels like her fluid was better controlled on spiro. Has some dizziness but more vertiginous. Denies SOB around the house doing chores and cooking. Sits to cook due to chronic back pain. Very little activity. When she gets around uses a walker. Denies CP. No orthopnea or PND.    Optivol: Thoracic impedence depressed, and fluid index elevated. Pt activity low (< an hour daily). No VT/VF. Chronic LV pacing Personally reviewed   ECHO (08/2013) EF 20-25%, RV normal            (02/2014) EF 25-30%, RV normal           (09/2014) EF 20-25% RV mildly dilated.            (02/2016) EF 20-25% RV normal            (06/2016)  Echo 06/15/16 EF 60-65%  SH: Lives in [place] with son and daughter, no ETOH and does not smoke, retired  [place]: Mother deceased: CAD?, HTN, lung cancer, afib        Father deceased: DM2, lung cancer, HTN, CAD stents        3 daughters (1 has afib)        1 son: healthy and alive  Review of systems complete and found to be negative unless listed in HPI.    Past Medical History:  Diagnosis Date  . Allergic rhinitis   . 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 Atrial fibrillation ([place])   . Chronic low back pain    Secondary to DJD  . Chronic systolic heart failure (HCC)    a.  echo (5/15):  mod LVH, EF 20-25%, diff HK, severe LAE, mild RAE  . Coronary atherosclerosis-non obstructive    LHC (5/15):  EF 40-45% global HK; long LAD 40-60%, ostial 1st major septal perf 80-90%, ostial CFX ?%, mid AVCFX extensive Ca2+, dist PDA 50%  . Depression   . Essential hypertension, benign   . Fatty liver disease, nonalcoholic   . GERD (gastroesophageal reflux disease)   . Iron deficiency anemia   . Mitral regurgitation   . NICM (nonischemic cardiomyopathy) ([place])    a. echo (5/15):  mod LVH, EF 20-25%, diff HK, severe LAE, mild RAE  . NSTEMI (non-ST elevated myocardial infarction) ([place]) may 2015   No CAD  . Obesity   . Osteopenia   . PONV (postoperative nausea and vomiting)   . Rosacea   . Type 2 diabetes mellitus ([place])   . Vitamin D deficiency     Current Outpatient Medications  Medication Sig Dispense Refill  . Calcium Carb-Cholecalciferol (CALCIUM 600 + D PO) Take 1 tablet by mouth 2 (two) times daily.     Marland Kitchen  carvedilol (COREG) 25 MG tablet TAKE (1) TABLET TWICE DAILY. 60 tablet 6  . colchicine 0.6 MG tablet Take 1 tablet (0.6 mg total) by mouth daily. 30 tablet 0  . dimenhyDRINATE (DRAMAMINE) 50 MG tablet Take 50 mg by mouth every 8 (eight) hours as needed for nausea.    Marland Kitchen doxycycline (VIBRAMYCIN) 100 MG capsule Take 100-200 mg by mouth 2 (two) times daily as needed (rosacea).     Marland Kitchen ELIQUIS 5 MG TABS tablet TAKE (1) TABLET TWICE DAILY. 60 tablet 3  . ENTRESTO 24-26 MG TAKE (1) TABLET TWICE DAILY. 60 tablet 6  . ferrous sulfate 325 (65 FE) MG tablet TAKE 1 TABLET ONCE DAILY WITH BREAKFAST. 30 tablet 0  . furosemide (LASIX) 80 MG tablet Take 80 mg by mouth 2 (two) times daily.    . insulin glargine (LANTUS) 100 UNIT/ML injection Inject 0.6-1 mLs (60-100 Units total) into the skin 2 (two) times daily. 100 units in the morning and 60 units in the evening 10 mL 11  . metoprolol succinate (TOPROL-XL) 25 MG 24 hr tablet TAKE (1) TABLET BY MOUTH ONCE DAILY. 30 tablet 9  .  metroNIDAZOLE (METROGEL) 0.75 % gel Apply 1 application topically 2 (two) times daily as needed (rosacea).    . mometasone (ELOCON) 0.1 % lotion Apply 1 application topically daily as needed (eczema). EAR ECZEMA    . omeprazole (PRILOSEC) 20 MG capsule Take 20 mg by mouth 2 (two) times daily before a meal.    . ondansetron (ZOFRAN) 8 MG tablet Take by mouth every 8 (eight) hours as needed for nausea or vomiting.    . polyethylene glycol (MIRALAX / GLYCOLAX) packet Take 17 g by mouth daily.    . rosuvastatin (CRESTOR) 20 MG tablet TAKE 1 TABLET ONCE A DAY AT 6 PM. 30 tablet 6  . sertraline (ZOLOFT) 100 MG tablet Take 100 mg by mouth daily.     No current facility-administered medications for this encounter.    Vitals:   05/23/17 1501  BP: 130/62  Pulse: 87  SpO2: 92%  Weight: 251 lb 12.8 oz (114.2 kg)   Wt Readings from Last 3 Encounters:  05/23/17 251 lb 12.8 oz (114.2 kg)  02/16/17 260 lb 3.2 oz (118 kg)  01/13/17 266 lb 9.6 oz (120.9 kg)   PHYSICAL EXAM: General: Obese woman sitting on exam table. No resp difficulty. HEENT: Normal anicteric Neck: Supple. JVP difficult to assess due to body habitus. Carotids 2+ bilat; no bruits. No thyromegaly or nodule noted. Cor: PMI nondisplaced. RRR, No M/G/R noted Lungs: CTAB, normal effort. Abdomen: obese Soft, non-tender, non-distended, no HSM. No bruits or masses. +BS  Extremities: No cyanosis, clubbing, or rash. 2-3+ BLE edema.  warm Neuro: Alert & orientedx3, cranial nerves grossly intact. moves all 4 extremities w/o difficulty. Affect pleasant   ASSESSMENT & PLAN: 1) Chronic systolic HF: NICM, EF 16-10%, RV nl (02/2014).  ECHO 09/2014 EF 20-25%. ECHO 01/2016 EF 20-25%.  - s/p CRT-D 1/18 -> Echo 3/18 EF 60-65% EF recovered with CRT-D - NYHA II.  - Volume status elevated on exam.  - Continue lasix 80 mg BID.  - Take metolazone 2.5 mg once.  - Continue toprol xl 25 mg daily - Continue Entresto 24/26 mg BID.  - Resume spironolactone  12.5 mg daily. BMET today and 7-10 days.  - Reinforced fluid restriction to < 2 L daily, sodium restriction to less than 2000 mg daily, and the importance of daily weights. 2) CAD: Moderate diffuse mid  LAD disease of 40-60%. Septal perforator branch with ostial ~90%.  - No s/s of ischemia. - Continue statin and bb.   3) CKD stage III: - BMET today.  4) Chronic Afib:  - Rate controlled.  - Continue eliquis 5 mg BID.  Denies bleeding.  5) LBBB-  - s/p CRT-D 1/18 6) HTN Stable on current medications.  7) Back pain - Very limited. Only moves about 45 minutes a day by Optivol.  - Encouraged ambulation.  - Will refer to physical therapy.  Shirley Friar, PA-C  05/23/2017   Patient seen and examined with the above-signed Advanced Practice Provider and/or Housestaff. I personally reviewed laboratory data, imaging studies and relevant notes. I independently examined the patient and formulated the important aspects of the plan. I have edited the note to reflect any of my changes or salient points. I have personally discussed the plan with the patient and/or family.  Most recent echo reviewed personally and EF seems to have made a complete recovery with CRT-D. Nevertheless she still has significant volume overload today. I suspect she is noncompliant with dietary restriction. Unable to tolerate Entresto previously due to low BPs. Will resume spiro. Also will give her a prescription for metolazone to use sparingly for volume overload not responding to lasix. Will repeat echo at next visit to confirm LV recovery. ICD interrogated personally and results reviewed with her and her daughter. Encouraged need for more activity. Will refer to PT.  Glori Bickers, MD  9:59 PM

## 2017-05-24 ENCOUNTER — Encounter (HOSPITAL_COMMUNITY): Payer: Self-pay

## 2017-05-30 ENCOUNTER — Telehealth (HOSPITAL_COMMUNITY): Payer: Self-pay | Admitting: *Deleted

## 2017-05-30 DIAGNOSIS — I5022 Chronic systolic (congestive) heart failure: Secondary | ICD-10-CM

## 2017-05-30 MED ORDER — POTASSIUM CHLORIDE CRYS ER 20 MEQ PO TBCR: 20.0000 meq | EXTENDED_RELEASE_TABLET | Freq: Every day | ORAL | 3 refills | Status: DC

## 2017-05-30 NOTE — Telephone Encounter (Signed)
Result Notes for Basic metabolic panel   Notes recorded by Darron Doom, RN on 05/30/2017 at 12:16 PM EST Patient called back and she is agreeable with plan. She stated she will have her daughter call back to schedule lab appointment. bmet ordered and prescription sent to pharmacy. ------  Notes recorded by Scarlette Calico, RN on 05/27/2017 at 4:39 PM EST Left message to call back On pt's VM and daughters VM ------  Notes recorded by Harvie Junior, CMA on 05/25/2017 at 4:10 PM EST Left VM for pt to call for lab results. ------  Notes recorded by Harvie Junior, CMA on 05/24/2017 at 3:22 PM EST Left VM for pt to call for lab results ------  Notes recorded by [name], Shaune Pascal, MD on 05/23/2017 at 9:24 PM EST K low. Spironolactone added today in clinic. Please also start kcl 20 daily. Take 40 meq the first day. Recheck BMET 1 week.

## 2017-06-06 DIAGNOSIS — G6289 Other specified polyneuropathies: Secondary | ICD-10-CM | POA: Diagnosis not present

## 2017-06-06 DIAGNOSIS — I482 Chronic atrial fibrillation: Secondary | ICD-10-CM | POA: Diagnosis not present

## 2017-06-06 DIAGNOSIS — E782 Mixed hyperlipidemia: Secondary | ICD-10-CM | POA: Diagnosis not present

## 2017-06-06 DIAGNOSIS — I2510 Atherosclerotic heart disease of native coronary artery without angina pectoris: Secondary | ICD-10-CM | POA: Diagnosis not present

## 2017-06-06 DIAGNOSIS — K219 Gastro-esophageal reflux disease without esophagitis: Secondary | ICD-10-CM | POA: Diagnosis not present

## 2017-06-06 DIAGNOSIS — F331 Major depressive disorder, recurrent, moderate: Secondary | ICD-10-CM | POA: Diagnosis not present

## 2017-06-06 DIAGNOSIS — Z6841 Body Mass Index (BMI) 40.0 and over, adult: Secondary | ICD-10-CM | POA: Diagnosis not present

## 2017-06-06 DIAGNOSIS — E1122 Type 2 diabetes mellitus with diabetic chronic kidney disease: Secondary | ICD-10-CM | POA: Diagnosis not present

## 2017-06-06 DIAGNOSIS — I10 Essential (primary) hypertension: Secondary | ICD-10-CM | POA: Diagnosis not present

## 2017-06-06 DIAGNOSIS — Z79891 Long term (current) use of opiate analgesic: Secondary | ICD-10-CM | POA: Diagnosis not present

## 2017-06-06 DIAGNOSIS — I5032 Chronic diastolic (congestive) heart failure: Secondary | ICD-10-CM | POA: Diagnosis not present

## 2017-06-06 DIAGNOSIS — J301 Allergic rhinitis due to pollen: Secondary | ICD-10-CM | POA: Diagnosis not present

## 2017-06-14 DIAGNOSIS — E782 Mixed hyperlipidemia: Secondary | ICD-10-CM | POA: Diagnosis not present

## 2017-06-14 DIAGNOSIS — E876 Hypokalemia: Secondary | ICD-10-CM | POA: Diagnosis not present

## 2017-06-14 DIAGNOSIS — E1165 Type 2 diabetes mellitus with hyperglycemia: Secondary | ICD-10-CM | POA: Diagnosis not present

## 2017-06-14 DIAGNOSIS — E871 Hypo-osmolality and hyponatremia: Secondary | ICD-10-CM | POA: Diagnosis not present

## 2017-06-21 ENCOUNTER — Other Ambulatory Visit: Payer: Self-pay | Admitting: Internal Medicine

## 2017-06-28 ENCOUNTER — Encounter (HOSPITAL_COMMUNITY): Payer: Medicare Other

## 2017-07-02 ENCOUNTER — Other Ambulatory Visit (HOSPITAL_COMMUNITY): Payer: Self-pay | Admitting: Internal Medicine

## 2017-07-02 DIAGNOSIS — I5022 Chronic systolic (congestive) heart failure: Secondary | ICD-10-CM

## 2017-07-14 ENCOUNTER — Encounter (HOSPITAL_COMMUNITY): Payer: Medicare Other

## 2017-07-15 ENCOUNTER — Other Ambulatory Visit: Payer: Self-pay | Admitting: Internal Medicine

## 2017-07-18 ENCOUNTER — Ambulatory Visit (INDEPENDENT_AMBULATORY_CARE_PROVIDER_SITE_OTHER): Payer: Medicare Other | Admitting: *Deleted

## 2017-07-18 DIAGNOSIS — I5022 Chronic systolic (congestive) heart failure: Secondary | ICD-10-CM

## 2017-07-18 DIAGNOSIS — I428 Other cardiomyopathies: Secondary | ICD-10-CM

## 2017-07-18 NOTE — Progress Notes (Signed)
Remote ICD transmission.   

## 2017-07-20 ENCOUNTER — Encounter: Payer: Self-pay | Admitting: Cardiology

## 2017-08-01 LAB — CUP PACEART REMOTE DEVICE CHECK
Battery Remaining Longevity: 93 mo
Brady Statistic AP VP Percent: 0 %
Brady Statistic AS VP Percent: 0 %
Brady Statistic AS VS Percent: 0 %
Brady Statistic RV Percent Paced: 89.75 %
Date Time Interrogation Session: 20190408072206
HIGH POWER IMPEDANCE MEASURED VALUE: 77 Ohm
Implantable Lead Location: 753860
Implantable Lead Model: 4598
Lead Channel Impedance Value: 199.5 Ohm
Lead Channel Impedance Value: 228 Ohm
Lead Channel Impedance Value: 228 Ohm
Lead Channel Impedance Value: 228 Ohm
Lead Channel Impedance Value: 399 Ohm
Lead Channel Impedance Value: 4047 Ohm
Lead Channel Impedance Value: 532 Ohm
Lead Channel Impedance Value: 589 Ohm
Lead Channel Impedance Value: 665 Ohm
Lead Channel Impedance Value: 722 Ohm
Lead Channel Impedance Value: 722 Ohm
Lead Channel Impedance Value: 855 Ohm
Lead Channel Impedance Value: 855 Ohm
Lead Channel Impedance Value: 855 Ohm
Lead Channel Pacing Threshold Amplitude: 0.75 V
Lead Channel Sensing Intrinsic Amplitude: 15.25 mV
MDC IDC LEAD IMPLANT DT: 20180105
MDC IDC LEAD IMPLANT DT: 20180105
MDC IDC LEAD LOCATION: 753858
MDC IDC MSMT BATTERY VOLTAGE: 3 V
MDC IDC MSMT LEADCHNL LV IMPEDANCE VALUE: 199.5 Ohm
MDC IDC MSMT LEADCHNL LV IMPEDANCE VALUE: 399 Ohm
MDC IDC MSMT LEADCHNL LV IMPEDANCE VALUE: 399 Ohm
MDC IDC MSMT LEADCHNL LV IMPEDANCE VALUE: 532 Ohm
MDC IDC MSMT LEADCHNL LV PACING THRESHOLD PULSEWIDTH: 0.8 ms
MDC IDC MSMT LEADCHNL RV PACING THRESHOLD AMPLITUDE: 0.5 V
MDC IDC MSMT LEADCHNL RV PACING THRESHOLD PULSEWIDTH: 0.4 ms
MDC IDC MSMT LEADCHNL RV SENSING INTR AMPL: 15.25 mV
MDC IDC PG IMPLANT DT: 20180105
MDC IDC SET LEADCHNL LV PACING AMPLITUDE: 1.25 V
MDC IDC SET LEADCHNL LV PACING PULSEWIDTH: 0.8 ms
MDC IDC SET LEADCHNL RV PACING AMPLITUDE: 2 V
MDC IDC SET LEADCHNL RV PACING PULSEWIDTH: 0.4 ms
MDC IDC SET LEADCHNL RV SENSING SENSITIVITY: 0.3 mV
MDC IDC STAT BRADY AP VS PERCENT: 0 %
MDC IDC STAT BRADY RA PERCENT PACED: 0 %

## 2017-08-03 ENCOUNTER — Other Ambulatory Visit: Payer: Self-pay | Admitting: Physician Assistant

## 2017-08-03 ENCOUNTER — Other Ambulatory Visit (HOSPITAL_COMMUNITY): Payer: Self-pay | Admitting: Internal Medicine

## 2017-08-03 DIAGNOSIS — I5022 Chronic systolic (congestive) heart failure: Secondary | ICD-10-CM

## 2017-08-27 ENCOUNTER — Other Ambulatory Visit: Payer: Self-pay | Admitting: Internal Medicine

## 2017-08-29 NOTE — Telephone Encounter (Signed)
This is a CHF pt 

## 2017-09-04 ENCOUNTER — Other Ambulatory Visit (HOSPITAL_COMMUNITY): Payer: Self-pay | Admitting: Internal Medicine

## 2017-09-08 ENCOUNTER — Other Ambulatory Visit (HOSPITAL_COMMUNITY): Payer: Self-pay | Admitting: Internal Medicine

## 2017-09-08 DIAGNOSIS — I5022 Chronic systolic (congestive) heart failure: Secondary | ICD-10-CM

## 2017-09-23 DIAGNOSIS — Z6841 Body Mass Index (BMI) 40.0 and over, adult: Secondary | ICD-10-CM | POA: Diagnosis not present

## 2017-09-23 DIAGNOSIS — K219 Gastro-esophageal reflux disease without esophagitis: Secondary | ICD-10-CM | POA: Diagnosis not present

## 2017-09-23 DIAGNOSIS — G6289 Other specified polyneuropathies: Secondary | ICD-10-CM | POA: Diagnosis not present

## 2017-09-23 DIAGNOSIS — I5032 Chronic diastolic (congestive) heart failure: Secondary | ICD-10-CM | POA: Diagnosis not present

## 2017-09-23 DIAGNOSIS — Z79891 Long term (current) use of opiate analgesic: Secondary | ICD-10-CM | POA: Diagnosis not present

## 2017-09-23 DIAGNOSIS — J301 Allergic rhinitis due to pollen: Secondary | ICD-10-CM | POA: Diagnosis not present

## 2017-09-23 DIAGNOSIS — I482 Chronic atrial fibrillation: Secondary | ICD-10-CM | POA: Diagnosis not present

## 2017-09-23 DIAGNOSIS — E782 Mixed hyperlipidemia: Secondary | ICD-10-CM | POA: Diagnosis not present

## 2017-09-23 DIAGNOSIS — R1084 Generalized abdominal pain: Secondary | ICD-10-CM | POA: Diagnosis not present

## 2017-09-23 DIAGNOSIS — F331 Major depressive disorder, recurrent, moderate: Secondary | ICD-10-CM | POA: Diagnosis not present

## 2017-09-23 DIAGNOSIS — E1122 Type 2 diabetes mellitus with diabetic chronic kidney disease: Secondary | ICD-10-CM | POA: Diagnosis not present

## 2017-09-23 DIAGNOSIS — I2510 Atherosclerotic heart disease of native coronary artery without angina pectoris: Secondary | ICD-10-CM | POA: Diagnosis not present

## 2017-09-23 DIAGNOSIS — Z1389 Encounter for screening for other disorder: Secondary | ICD-10-CM | POA: Diagnosis not present

## 2017-09-23 DIAGNOSIS — I10 Essential (primary) hypertension: Secondary | ICD-10-CM | POA: Diagnosis not present

## 2017-10-06 ENCOUNTER — Other Ambulatory Visit (HOSPITAL_COMMUNITY): Payer: Self-pay | Admitting: Internal Medicine

## 2017-10-06 DIAGNOSIS — I5022 Chronic systolic (congestive) heart failure: Secondary | ICD-10-CM

## 2017-10-17 ENCOUNTER — Ambulatory Visit (INDEPENDENT_AMBULATORY_CARE_PROVIDER_SITE_OTHER): Payer: Medicare Other | Admitting: *Deleted

## 2017-10-17 ENCOUNTER — Other Ambulatory Visit (HOSPITAL_COMMUNITY): Payer: Self-pay | Admitting: Internal Medicine

## 2017-10-17 DIAGNOSIS — I428 Other cardiomyopathies: Secondary | ICD-10-CM | POA: Diagnosis not present

## 2017-10-17 DIAGNOSIS — I5022 Chronic systolic (congestive) heart failure: Secondary | ICD-10-CM

## 2017-10-17 NOTE — Progress Notes (Signed)
Remote ICD transmission.   

## 2017-10-21 LAB — CUP PACEART REMOTE DEVICE CHECK
Battery Remaining Longevity: 90 mo
Battery Voltage: 3 V
Brady Statistic RA Percent Paced: 0 %
Brady Statistic RV Percent Paced: 90.69 %
HIGH POWER IMPEDANCE MEASURED VALUE: 75 Ohm
Implantable Lead Implant Date: 20180105
Implantable Lead Model: 4598
Lead Channel Impedance Value: 208.568
Lead Channel Impedance Value: 235.98 Ohm
Lead Channel Impedance Value: 235.98 Ohm
Lead Channel Impedance Value: 437 Ohm
Lead Channel Impedance Value: 437 Ohm
Lead Channel Impedance Value: 627 Ohm
Lead Channel Impedance Value: 779 Ohm
Lead Channel Impedance Value: 836 Ohm
Lead Channel Impedance Value: 836 Ohm
Lead Channel Pacing Threshold Amplitude: 0.5 V
Lead Channel Pacing Threshold Pulse Width: 0.4 ms
Lead Channel Setting Pacing Amplitude: 1.25 V
Lead Channel Setting Pacing Pulse Width: 0.4 ms
MDC IDC LEAD IMPLANT DT: 20180105
MDC IDC LEAD LOCATION: 753858
MDC IDC LEAD LOCATION: 753860
MDC IDC MSMT LEADCHNL LV IMPEDANCE VALUE: 208.568
MDC IDC MSMT LEADCHNL LV IMPEDANCE VALUE: 224.438
MDC IDC MSMT LEADCHNL LV IMPEDANCE VALUE: 399 Ohm
MDC IDC MSMT LEADCHNL LV IMPEDANCE VALUE: 513 Ohm
MDC IDC MSMT LEADCHNL LV IMPEDANCE VALUE: 532 Ohm
MDC IDC MSMT LEADCHNL LV IMPEDANCE VALUE: 722 Ohm
MDC IDC MSMT LEADCHNL LV IMPEDANCE VALUE: 912 Ohm
MDC IDC MSMT LEADCHNL LV PACING THRESHOLD AMPLITUDE: 0.75 V
MDC IDC MSMT LEADCHNL LV PACING THRESHOLD PULSEWIDTH: 0.8 ms
MDC IDC MSMT LEADCHNL RA IMPEDANCE VALUE: 4047 Ohm
MDC IDC MSMT LEADCHNL RV IMPEDANCE VALUE: 665 Ohm
MDC IDC MSMT LEADCHNL RV SENSING INTR AMPL: 17.125 mV
MDC IDC MSMT LEADCHNL RV SENSING INTR AMPL: 17.125 mV
MDC IDC PG IMPLANT DT: 20180105
MDC IDC SESS DTM: 20190708092604
MDC IDC SET LEADCHNL LV PACING PULSEWIDTH: 0.8 ms
MDC IDC SET LEADCHNL RV PACING AMPLITUDE: 2 V
MDC IDC SET LEADCHNL RV SENSING SENSITIVITY: 0.3 mV
MDC IDC STAT BRADY AP VP PERCENT: 0 %
MDC IDC STAT BRADY AP VS PERCENT: 0 %
MDC IDC STAT BRADY AS VP PERCENT: 0 %
MDC IDC STAT BRADY AS VS PERCENT: 0 %

## 2017-11-09 ENCOUNTER — Other Ambulatory Visit (HOSPITAL_COMMUNITY): Payer: Self-pay | Admitting: Internal Medicine

## 2018-01-16 ENCOUNTER — Ambulatory Visit (INDEPENDENT_AMBULATORY_CARE_PROVIDER_SITE_OTHER): Payer: Medicare Other | Admitting: *Deleted

## 2018-01-16 DIAGNOSIS — I428 Other cardiomyopathies: Secondary | ICD-10-CM

## 2018-01-16 NOTE — Progress Notes (Signed)
Remote ICD transmission.   

## 2018-01-25 ENCOUNTER — Encounter: Payer: Self-pay | Admitting: Cardiology

## 2018-02-06 DIAGNOSIS — E782 Mixed hyperlipidemia: Secondary | ICD-10-CM | POA: Diagnosis not present

## 2018-02-06 DIAGNOSIS — I2510 Atherosclerotic heart disease of native coronary artery without angina pectoris: Secondary | ICD-10-CM | POA: Diagnosis not present

## 2018-02-06 DIAGNOSIS — I10 Essential (primary) hypertension: Secondary | ICD-10-CM | POA: Diagnosis not present

## 2018-02-06 DIAGNOSIS — Z6841 Body Mass Index (BMI) 40.0 and over, adult: Secondary | ICD-10-CM | POA: Diagnosis not present

## 2018-02-06 DIAGNOSIS — Z0001 Encounter for general adult medical examination with abnormal findings: Secondary | ICD-10-CM | POA: Diagnosis not present

## 2018-02-06 DIAGNOSIS — I5032 Chronic diastolic (congestive) heart failure: Secondary | ICD-10-CM | POA: Diagnosis not present

## 2018-02-06 DIAGNOSIS — F331 Major depressive disorder, recurrent, moderate: Secondary | ICD-10-CM | POA: Diagnosis not present

## 2018-02-06 DIAGNOSIS — E1122 Type 2 diabetes mellitus with diabetic chronic kidney disease: Secondary | ICD-10-CM | POA: Diagnosis not present

## 2018-02-06 DIAGNOSIS — Z23 Encounter for immunization: Secondary | ICD-10-CM | POA: Diagnosis not present

## 2018-02-06 DIAGNOSIS — Z79891 Long term (current) use of opiate analgesic: Secondary | ICD-10-CM | POA: Diagnosis not present

## 2018-02-06 DIAGNOSIS — J301 Allergic rhinitis due to pollen: Secondary | ICD-10-CM | POA: Diagnosis not present

## 2018-02-06 DIAGNOSIS — K219 Gastro-esophageal reflux disease without esophagitis: Secondary | ICD-10-CM | POA: Diagnosis not present

## 2018-02-06 DIAGNOSIS — G6289 Other specified polyneuropathies: Secondary | ICD-10-CM | POA: Diagnosis not present

## 2018-02-06 DIAGNOSIS — I482 Chronic atrial fibrillation, unspecified: Secondary | ICD-10-CM | POA: Diagnosis not present

## 2018-02-09 LAB — CUP PACEART REMOTE DEVICE CHECK
Battery Voltage: 2.98 V
Brady Statistic AP VP Percent: 0 %
Brady Statistic AS VP Percent: 0 %
Brady Statistic RA Percent Paced: 0 %
Date Time Interrogation Session: 20191007071606
HighPow Impedance: 78 Ohm
Implantable Lead Implant Date: 20180105
Implantable Lead Location: 753858
Implantable Lead Model: 4598
Lead Channel Impedance Value: 220.723
Lead Channel Impedance Value: 228 Ohm
Lead Channel Impedance Value: 256.148
Lead Channel Impedance Value: 399 Ohm
Lead Channel Impedance Value: 4047 Ohm
Lead Channel Impedance Value: 627 Ohm
Lead Channel Impedance Value: 760 Ohm
Lead Channel Impedance Value: 893 Ohm
Lead Channel Impedance Value: 950 Ohm
Lead Channel Impedance Value: 950 Ohm
Lead Channel Pacing Threshold Pulse Width: 0.4 ms
Lead Channel Setting Pacing Amplitude: 1.25 V
Lead Channel Setting Pacing Amplitude: 2 V
Lead Channel Setting Pacing Pulse Width: 0.8 ms
MDC IDC LEAD IMPLANT DT: 20180105
MDC IDC LEAD LOCATION: 753860
MDC IDC MSMT BATTERY REMAINING LONGEVITY: 89 mo
MDC IDC MSMT LEADCHNL LV IMPEDANCE VALUE: 212.8 Ohm
MDC IDC MSMT LEADCHNL LV IMPEDANCE VALUE: 245.538
MDC IDC MSMT LEADCHNL LV IMPEDANCE VALUE: 456 Ohm
MDC IDC MSMT LEADCHNL LV IMPEDANCE VALUE: 494 Ohm
MDC IDC MSMT LEADCHNL LV IMPEDANCE VALUE: 532 Ohm
MDC IDC MSMT LEADCHNL LV IMPEDANCE VALUE: 817 Ohm
MDC IDC MSMT LEADCHNL LV IMPEDANCE VALUE: 817 Ohm
MDC IDC MSMT LEADCHNL LV PACING THRESHOLD AMPLITUDE: 0.75 V
MDC IDC MSMT LEADCHNL LV PACING THRESHOLD PULSEWIDTH: 0.8 ms
MDC IDC MSMT LEADCHNL RV IMPEDANCE VALUE: 646 Ohm
MDC IDC MSMT LEADCHNL RV PACING THRESHOLD AMPLITUDE: 0.5 V
MDC IDC MSMT LEADCHNL RV SENSING INTR AMPL: 14.125 mV
MDC IDC MSMT LEADCHNL RV SENSING INTR AMPL: 14.125 mV
MDC IDC PG IMPLANT DT: 20180105
MDC IDC SET LEADCHNL RV PACING PULSEWIDTH: 0.4 ms
MDC IDC SET LEADCHNL RV SENSING SENSITIVITY: 0.3 mV
MDC IDC STAT BRADY AP VS PERCENT: 0 %
MDC IDC STAT BRADY AS VS PERCENT: 0 %
MDC IDC STAT BRADY RV PERCENT PACED: 90.52 %

## 2018-02-14 ENCOUNTER — Other Ambulatory Visit (HOSPITAL_COMMUNITY): Payer: Self-pay | Admitting: Internal Medicine

## 2018-02-14 DIAGNOSIS — I5022 Chronic systolic (congestive) heart failure: Secondary | ICD-10-CM

## 2018-03-18 ENCOUNTER — Other Ambulatory Visit (HOSPITAL_COMMUNITY): Payer: Self-pay | Admitting: Internal Medicine

## 2018-04-17 ENCOUNTER — Ambulatory Visit (INDEPENDENT_AMBULATORY_CARE_PROVIDER_SITE_OTHER): Payer: Medicare Other

## 2018-04-17 DIAGNOSIS — I428 Other cardiomyopathies: Secondary | ICD-10-CM | POA: Diagnosis not present

## 2018-04-17 DIAGNOSIS — I5022 Chronic systolic (congestive) heart failure: Secondary | ICD-10-CM

## 2018-04-18 LAB — CUP PACEART REMOTE DEVICE CHECK
Brady Statistic AP VP Percent: 0 %
Brady Statistic AP VS Percent: 0 %
Brady Statistic AS VS Percent: 0 %
Brady Statistic RA Percent Paced: 0 %
Date Time Interrogation Session: 20200106111609
HIGH POWER IMPEDANCE MEASURED VALUE: 79 Ohm
Implantable Lead Implant Date: 20180105
Implantable Lead Location: 753860
Implantable Pulse Generator Implant Date: 20180105
Lead Channel Impedance Value: 184.154
Lead Channel Impedance Value: 234.706
Lead Channel Impedance Value: 342 Ohm
Lead Channel Impedance Value: 399 Ohm
Lead Channel Impedance Value: 4047 Ohm
Lead Channel Impedance Value: 513 Ohm
Lead Channel Impedance Value: 627 Ohm
Lead Channel Impedance Value: 646 Ohm
Lead Channel Impedance Value: 703 Ohm
Lead Channel Impedance Value: 817 Ohm
Lead Channel Impedance Value: 855 Ohm
Lead Channel Pacing Threshold Amplitude: 0.5 V
Lead Channel Pacing Threshold Pulse Width: 0.4 ms
Lead Channel Pacing Threshold Pulse Width: 0.8 ms
Lead Channel Sensing Intrinsic Amplitude: 16.75 mV
Lead Channel Setting Pacing Amplitude: 2 V
MDC IDC LEAD IMPLANT DT: 20180105
MDC IDC LEAD LOCATION: 753858
MDC IDC MSMT BATTERY REMAINING LONGEVITY: 86 mo
MDC IDC MSMT BATTERY VOLTAGE: 2.99 V
MDC IDC MSMT LEADCHNL LV IMPEDANCE VALUE: 184.154
MDC IDC MSMT LEADCHNL LV IMPEDANCE VALUE: 213.75 Ohm
MDC IDC MSMT LEADCHNL LV IMPEDANCE VALUE: 234.706
MDC IDC MSMT LEADCHNL LV IMPEDANCE VALUE: 399 Ohm
MDC IDC MSMT LEADCHNL LV IMPEDANCE VALUE: 570 Ohm
MDC IDC MSMT LEADCHNL LV IMPEDANCE VALUE: 703 Ohm
MDC IDC MSMT LEADCHNL LV IMPEDANCE VALUE: 893 Ohm
MDC IDC MSMT LEADCHNL LV PACING THRESHOLD AMPLITUDE: 0.625 V
MDC IDC MSMT LEADCHNL RV SENSING INTR AMPL: 16.75 mV
MDC IDC SET LEADCHNL LV PACING AMPLITUDE: 1.25 V
MDC IDC SET LEADCHNL LV PACING PULSEWIDTH: 0.8 ms
MDC IDC SET LEADCHNL RV PACING PULSEWIDTH: 0.4 ms
MDC IDC SET LEADCHNL RV SENSING SENSITIVITY: 0.3 mV
MDC IDC STAT BRADY AS VP PERCENT: 0 %
MDC IDC STAT BRADY RV PERCENT PACED: 90.99 %

## 2018-04-18 NOTE — Progress Notes (Signed)
Remote ICD transmission.   

## 2018-05-11 DIAGNOSIS — E1165 Type 2 diabetes mellitus with hyperglycemia: Secondary | ICD-10-CM | POA: Diagnosis not present

## 2018-05-11 DIAGNOSIS — I10 Essential (primary) hypertension: Secondary | ICD-10-CM | POA: Diagnosis not present

## 2018-05-17 ENCOUNTER — Other Ambulatory Visit (HOSPITAL_COMMUNITY): Payer: Self-pay | Admitting: Internal Medicine

## 2018-05-17 DIAGNOSIS — I5022 Chronic systolic (congestive) heart failure: Secondary | ICD-10-CM

## 2018-05-17 NOTE — Telephone Encounter (Signed)
Pt needs an appt for future refills [phone]

## 2018-06-16 ENCOUNTER — Other Ambulatory Visit (HOSPITAL_COMMUNITY): Payer: Self-pay | Admitting: Internal Medicine

## 2018-06-16 DIAGNOSIS — I5022 Chronic systolic (congestive) heart failure: Secondary | ICD-10-CM

## 2018-07-17 ENCOUNTER — Ambulatory Visit (INDEPENDENT_AMBULATORY_CARE_PROVIDER_SITE_OTHER): Payer: Medicare Other | Admitting: *Deleted

## 2018-07-17 ENCOUNTER — Other Ambulatory Visit: Payer: Self-pay

## 2018-07-17 DIAGNOSIS — I428 Other cardiomyopathies: Secondary | ICD-10-CM | POA: Diagnosis not present

## 2018-07-17 LAB — CUP PACEART REMOTE DEVICE CHECK
Battery Remaining Longevity: 79 mo
Battery Voltage: 2.98 V
Brady Statistic AP VP Percent: 0 %
Brady Statistic AP VS Percent: 0 %
Brady Statistic AS VP Percent: 0 %
Brady Statistic AS VS Percent: 0 %
Brady Statistic RA Percent Paced: 0 %
Brady Statistic RV Percent Paced: 89.57 %
Date Time Interrogation Session: 20200406113625
HighPow Impedance: 71 Ohm
Implantable Lead Implant Date: 20180105
Implantable Lead Implant Date: 20180105
Implantable Lead Location: 753858
Implantable Lead Location: 753860
Implantable Lead Model: 4598
Implantable Pulse Generator Implant Date: 20180105
Lead Channel Impedance Value: 184.154
Lead Channel Impedance Value: 191.854
Lead Channel Impedance Value: 208.174
Lead Channel Impedance Value: 228 Ohm
Lead Channel Impedance Value: 239.922
Lead Channel Impedance Value: 342 Ohm
Lead Channel Impedance Value: 399 Ohm
Lead Channel Impedance Value: 4047 Ohm
Lead Channel Impedance Value: 437 Ohm
Lead Channel Impedance Value: 513 Ohm
Lead Channel Impedance Value: 532 Ohm
Lead Channel Impedance Value: 589 Ohm
Lead Channel Impedance Value: 627 Ohm
Lead Channel Impedance Value: 646 Ohm
Lead Channel Impedance Value: 665 Ohm
Lead Channel Impedance Value: 779 Ohm
Lead Channel Impedance Value: 817 Ohm
Lead Channel Impedance Value: 836 Ohm
Lead Channel Pacing Threshold Amplitude: 0.5 V
Lead Channel Pacing Threshold Amplitude: 0.625 V
Lead Channel Pacing Threshold Pulse Width: 0.4 ms
Lead Channel Pacing Threshold Pulse Width: 0.8 ms
Lead Channel Sensing Intrinsic Amplitude: 19.875 mV
Lead Channel Sensing Intrinsic Amplitude: 19.875 mV
Lead Channel Setting Pacing Amplitude: 1.25 V
Lead Channel Setting Pacing Amplitude: 2 V
Lead Channel Setting Pacing Pulse Width: 0.4 ms
Lead Channel Setting Pacing Pulse Width: 0.8 ms
Lead Channel Setting Sensing Sensitivity: 0.3 mV

## 2018-07-25 ENCOUNTER — Encounter: Payer: Self-pay | Admitting: Cardiology

## 2018-07-25 NOTE — Progress Notes (Signed)
Remote ICD transmission.   

## 2018-08-01 DIAGNOSIS — E1122 Type 2 diabetes mellitus with diabetic chronic kidney disease: Secondary | ICD-10-CM | POA: Diagnosis not present

## 2018-08-01 DIAGNOSIS — Z79891 Long term (current) use of opiate analgesic: Secondary | ICD-10-CM | POA: Diagnosis not present

## 2018-08-01 DIAGNOSIS — I4891 Unspecified atrial fibrillation: Secondary | ICD-10-CM | POA: Diagnosis not present

## 2018-08-01 DIAGNOSIS — R1084 Generalized abdominal pain: Secondary | ICD-10-CM | POA: Diagnosis not present

## 2018-08-01 DIAGNOSIS — I10 Essential (primary) hypertension: Secondary | ICD-10-CM | POA: Diagnosis not present

## 2018-08-01 DIAGNOSIS — I25119 Atherosclerotic heart disease of native coronary artery with unspecified angina pectoris: Secondary | ICD-10-CM | POA: Diagnosis not present

## 2018-08-01 DIAGNOSIS — I5032 Chronic diastolic (congestive) heart failure: Secondary | ICD-10-CM | POA: Diagnosis not present

## 2018-08-01 DIAGNOSIS — Z6841 Body Mass Index (BMI) 40.0 and over, adult: Secondary | ICD-10-CM | POA: Diagnosis not present

## 2018-09-23 ENCOUNTER — Other Ambulatory Visit (HOSPITAL_COMMUNITY): Payer: Self-pay | Admitting: Internal Medicine

## 2018-10-04 ENCOUNTER — Other Ambulatory Visit (HOSPITAL_COMMUNITY): Payer: Self-pay | Admitting: Internal Medicine

## 2018-10-04 DIAGNOSIS — I5022 Chronic systolic (congestive) heart failure: Secondary | ICD-10-CM

## 2018-10-05 ENCOUNTER — Other Ambulatory Visit (HOSPITAL_COMMUNITY): Payer: Self-pay | Admitting: Internal Medicine

## 2018-10-06 ENCOUNTER — Other Ambulatory Visit (HOSPITAL_COMMUNITY): Payer: Self-pay

## 2018-10-16 ENCOUNTER — Ambulatory Visit (INDEPENDENT_AMBULATORY_CARE_PROVIDER_SITE_OTHER): Payer: Medicare Other | Admitting: *Deleted

## 2018-10-16 DIAGNOSIS — I428 Other cardiomyopathies: Secondary | ICD-10-CM | POA: Diagnosis not present

## 2018-10-16 DIAGNOSIS — I5022 Chronic systolic (congestive) heart failure: Secondary | ICD-10-CM

## 2018-10-16 LAB — CUP PACEART REMOTE DEVICE CHECK
Battery Remaining Longevity: 71 mo
Battery Voltage: 2.98 V
Brady Statistic AP VP Percent: 0 %
Brady Statistic AP VS Percent: 0 %
Brady Statistic AS VP Percent: 0 %
Brady Statistic AS VS Percent: 0 %
Brady Statistic RA Percent Paced: 0 %
Brady Statistic RV Percent Paced: 89.48 %
Date Time Interrogation Session: 20200706084222
HighPow Impedance: 66 Ohm
Implantable Lead Implant Date: 20180105
Implantable Lead Implant Date: 20180105
Implantable Lead Location: 753858
Implantable Lead Location: 753860
Implantable Lead Model: 4598
Implantable Pulse Generator Implant Date: 20180105
Lead Channel Impedance Value: 194.634
Lead Channel Impedance Value: 208.568
Lead Channel Impedance Value: 228 Ohm
Lead Channel Impedance Value: 234.706
Lead Channel Impedance Value: 247.358
Lead Channel Impedance Value: 380 Ohm
Lead Channel Impedance Value: 399 Ohm
Lead Channel Impedance Value: 4047 Ohm
Lead Channel Impedance Value: 437 Ohm
Lead Channel Impedance Value: 532 Ohm
Lead Channel Impedance Value: 570 Ohm
Lead Channel Impedance Value: 589 Ohm
Lead Channel Impedance Value: 665 Ohm
Lead Channel Impedance Value: 665 Ohm
Lead Channel Impedance Value: 665 Ohm
Lead Channel Impedance Value: 817 Ohm
Lead Channel Impedance Value: 836 Ohm
Lead Channel Impedance Value: 893 Ohm
Lead Channel Pacing Threshold Amplitude: 0.5 V
Lead Channel Pacing Threshold Amplitude: 0.875 V
Lead Channel Pacing Threshold Pulse Width: 0.4 ms
Lead Channel Pacing Threshold Pulse Width: 0.8 ms
Lead Channel Sensing Intrinsic Amplitude: 17.875 mV
Lead Channel Sensing Intrinsic Amplitude: 17.875 mV
Lead Channel Setting Pacing Amplitude: 1.5 V
Lead Channel Setting Pacing Amplitude: 2 V
Lead Channel Setting Pacing Pulse Width: 0.4 ms
Lead Channel Setting Pacing Pulse Width: 0.8 ms
Lead Channel Setting Sensing Sensitivity: 0.3 mV

## 2018-10-23 ENCOUNTER — Encounter: Payer: Self-pay | Admitting: Cardiology

## 2018-10-23 NOTE — Progress Notes (Signed)
Remote ICD transmission.   

## 2018-11-01 ENCOUNTER — Other Ambulatory Visit (HOSPITAL_COMMUNITY): Payer: Self-pay | Admitting: Internal Medicine

## 2018-11-01 DIAGNOSIS — I5022 Chronic systolic (congestive) heart failure: Secondary | ICD-10-CM

## 2018-11-17 ENCOUNTER — Other Ambulatory Visit (HOSPITAL_COMMUNITY): Payer: Self-pay | Admitting: Internal Medicine

## 2018-11-29 ENCOUNTER — Other Ambulatory Visit (HOSPITAL_COMMUNITY): Payer: Self-pay | Admitting: Internal Medicine

## 2018-12-14 ENCOUNTER — Other Ambulatory Visit (HOSPITAL_COMMUNITY): Payer: Self-pay | Admitting: Internal Medicine

## 2018-12-14 DIAGNOSIS — I5022 Chronic systolic (congestive) heart failure: Secondary | ICD-10-CM

## 2018-12-30 ENCOUNTER — Other Ambulatory Visit (HOSPITAL_COMMUNITY): Payer: Self-pay | Admitting: Internal Medicine

## 2019-01-15 ENCOUNTER — Ambulatory Visit (INDEPENDENT_AMBULATORY_CARE_PROVIDER_SITE_OTHER): Payer: Medicare Other | Admitting: *Deleted

## 2019-01-15 ENCOUNTER — Other Ambulatory Visit (HOSPITAL_COMMUNITY): Payer: Self-pay | Admitting: Internal Medicine

## 2019-01-15 DIAGNOSIS — I428 Other cardiomyopathies: Secondary | ICD-10-CM | POA: Diagnosis not present

## 2019-01-15 DIAGNOSIS — I447 Left bundle-branch block, unspecified: Secondary | ICD-10-CM

## 2019-01-15 DIAGNOSIS — I5022 Chronic systolic (congestive) heart failure: Secondary | ICD-10-CM

## 2019-01-16 LAB — CUP PACEART REMOTE DEVICE CHECK
Battery Remaining Longevity: 68 mo
Battery Voltage: 2.97 V
Brady Statistic AP VP Percent: 0 %
Brady Statistic AP VS Percent: 0 %
Brady Statistic AS VP Percent: 0 %
Brady Statistic AS VS Percent: 0 %
Brady Statistic RA Percent Paced: 0 %
Brady Statistic RV Percent Paced: 86.34 %
Date Time Interrogation Session: 20201005083727
HighPow Impedance: 85 Ohm
Implantable Lead Implant Date: 20180105
Implantable Lead Implant Date: 20180105
Implantable Lead Location: 753858
Implantable Lead Location: 753860
Implantable Lead Model: 4598
Implantable Pulse Generator Implant Date: 20180105
Lead Channel Impedance Value: 180 Ohm
Lead Channel Impedance Value: 184.154
Lead Channel Impedance Value: 213.75 Ohm
Lead Channel Impedance Value: 228 Ohm
Lead Channel Impedance Value: 234.706
Lead Channel Impedance Value: 342 Ohm
Lead Channel Impedance Value: 380 Ohm
Lead Channel Impedance Value: 399 Ohm
Lead Channel Impedance Value: 4047 Ohm
Lead Channel Impedance Value: 513 Ohm
Lead Channel Impedance Value: 570 Ohm
Lead Channel Impedance Value: 589 Ohm
Lead Channel Impedance Value: 627 Ohm
Lead Channel Impedance Value: 646 Ohm
Lead Channel Impedance Value: 665 Ohm
Lead Channel Impedance Value: 817 Ohm
Lead Channel Impedance Value: 855 Ohm
Lead Channel Impedance Value: 855 Ohm
Lead Channel Pacing Threshold Amplitude: 0.5 V
Lead Channel Pacing Threshold Amplitude: 1 V
Lead Channel Pacing Threshold Pulse Width: 0.4 ms
Lead Channel Pacing Threshold Pulse Width: 0.8 ms
Lead Channel Sensing Intrinsic Amplitude: 18.5 mV
Lead Channel Sensing Intrinsic Amplitude: 18.5 mV
Lead Channel Setting Pacing Amplitude: 1.5 V
Lead Channel Setting Pacing Amplitude: 2 V
Lead Channel Setting Pacing Pulse Width: 0.4 ms
Lead Channel Setting Pacing Pulse Width: 0.8 ms
Lead Channel Setting Sensing Sensitivity: 0.3 mV

## 2019-01-18 ENCOUNTER — Encounter: Payer: Self-pay | Admitting: Internal Medicine

## 2019-01-19 ENCOUNTER — Telehealth: Payer: Self-pay

## 2019-01-19 NOTE — Telephone Encounter (Signed)
Attempted ICM intro call to patient and left message to return call.

## 2019-01-19 NOTE — Telephone Encounter (Signed)
Referred to ICM clinic by Levander Campion, device clinic RN.  Spoke with daughter [name], per DPR and explained ICM clinic.  She said she would like to have patient followed on a monthly basis for fluid level device check.  Advised patient is due to schedule appointments with Dr Caryl Comes and Dr Haroldine Laws.  [name] provides patient's transportation to physician appointments.  She asked if there is a physician in [place] which would be closer patient.  Advised Dr Haroldine Laws only has [facility] but Dr Caryl Comes does not travel to [place] office.  Advised I would check if it is possible to be followed for EP in the [place] office and give her a call back. Will have HF clinic reach out to daughter to schedule an appointment with Dr Haroldine Laws.   She requested I speak with patient regarding if she is having fluid symptoms and if she is compliant with taking Furosemide twice a day.  Patient can be reached at [phone].

## 2019-01-21 ENCOUNTER — Other Ambulatory Visit: Payer: Self-pay

## 2019-01-21 ENCOUNTER — Encounter (HOSPITAL_COMMUNITY): Payer: Self-pay | Admitting: Emergency Medicine

## 2019-01-21 ENCOUNTER — Emergency Department (HOSPITAL_COMMUNITY): Payer: Medicare Other

## 2019-01-21 ENCOUNTER — Emergency Department (HOSPITAL_COMMUNITY)
Admission: EM | Admit: 2019-01-21 | Discharge: 2019-01-22 | Disposition: A | Discharge status: Home / Self Care | Payer: Medicare Other | Attending: Emergency Medicine | Admitting: Emergency Medicine

## 2019-01-21 DIAGNOSIS — R109 Unspecified abdominal pain: Secondary | ICD-10-CM | POA: Diagnosis not present

## 2019-01-21 DIAGNOSIS — Y9389 Activity, other specified: Secondary | ICD-10-CM | POA: Diagnosis not present

## 2019-01-21 DIAGNOSIS — S3991XA Unspecified injury of abdomen, initial encounter: Secondary | ICD-10-CM | POA: Diagnosis not present

## 2019-01-21 DIAGNOSIS — R519 Headache, unspecified: Secondary | ICD-10-CM | POA: Diagnosis not present

## 2019-01-21 DIAGNOSIS — I252 Old myocardial infarction: Secondary | ICD-10-CM | POA: Insufficient documentation

## 2019-01-21 DIAGNOSIS — Y92009 Unspecified place in unspecified non-institutional (private) residence as the place of occurrence of the external cause: Secondary | ICD-10-CM | POA: Insufficient documentation

## 2019-01-21 DIAGNOSIS — Z794 Long term (current) use of insulin: Secondary | ICD-10-CM | POA: Insufficient documentation

## 2019-01-21 DIAGNOSIS — Y999 Unspecified external cause status: Secondary | ICD-10-CM | POA: Insufficient documentation

## 2019-01-21 DIAGNOSIS — I5022 Chronic systolic (congestive) heart failure: Secondary | ICD-10-CM | POA: Diagnosis not present

## 2019-01-21 DIAGNOSIS — W010XXA Fall on same level from slipping, tripping and stumbling without subsequent striking against object, initial encounter: Secondary | ICD-10-CM | POA: Diagnosis not present

## 2019-01-21 DIAGNOSIS — M545 Low back pain: Secondary | ICD-10-CM | POA: Diagnosis not present

## 2019-01-21 DIAGNOSIS — S22088A Other fracture of T11-T12 vertebra, initial encounter for closed fracture: Secondary | ICD-10-CM | POA: Insufficient documentation

## 2019-01-21 DIAGNOSIS — N183 Chronic kidney disease, stage 3 unspecified: Secondary | ICD-10-CM | POA: Diagnosis not present

## 2019-01-21 DIAGNOSIS — E1122 Type 2 diabetes mellitus with diabetic chronic kidney disease: Secondary | ICD-10-CM | POA: Insufficient documentation

## 2019-01-21 DIAGNOSIS — S79911A Unspecified injury of right hip, initial encounter: Secondary | ICD-10-CM | POA: Diagnosis not present

## 2019-01-21 DIAGNOSIS — S79912A Unspecified injury of left hip, initial encounter: Secondary | ICD-10-CM | POA: Diagnosis not present

## 2019-01-21 DIAGNOSIS — Z79899 Other long term (current) drug therapy: Secondary | ICD-10-CM | POA: Diagnosis not present

## 2019-01-21 DIAGNOSIS — S3992XA Unspecified injury of lower back, initial encounter: Secondary | ICD-10-CM | POA: Diagnosis not present

## 2019-01-21 DIAGNOSIS — M542 Cervicalgia: Secondary | ICD-10-CM | POA: Diagnosis not present

## 2019-01-21 DIAGNOSIS — M25552 Pain in left hip: Secondary | ICD-10-CM | POA: Diagnosis not present

## 2019-01-21 DIAGNOSIS — I130 Hypertensive heart and chronic kidney disease with heart failure and stage 1 through stage 4 chronic kidney disease, or unspecified chronic kidney disease: Secondary | ICD-10-CM | POA: Diagnosis not present

## 2019-01-21 DIAGNOSIS — S299XXA Unspecified injury of thorax, initial encounter: Secondary | ICD-10-CM | POA: Diagnosis present

## 2019-01-21 DIAGNOSIS — S0990XA Unspecified injury of head, initial encounter: Secondary | ICD-10-CM | POA: Insufficient documentation

## 2019-01-21 DIAGNOSIS — M25551 Pain in right hip: Secondary | ICD-10-CM | POA: Diagnosis not present

## 2019-01-21 DIAGNOSIS — S22078A Other fracture of T9-T10 vertebra, initial encounter for closed fracture: Secondary | ICD-10-CM | POA: Insufficient documentation

## 2019-01-21 DIAGNOSIS — Z7901 Long term (current) use of anticoagulants: Secondary | ICD-10-CM | POA: Diagnosis not present

## 2019-01-21 DIAGNOSIS — W19XXXA Unspecified fall, initial encounter: Secondary | ICD-10-CM

## 2019-01-21 LAB — CBC WITH DIFFERENTIAL/PLATELET
Abs Immature Granulocytes: 0.03 10*3/uL (ref 0.00–0.07)
Basophils Absolute: 0.1 10*3/uL (ref 0.0–0.1)
Basophils Relative: 1 %
Eosinophils Absolute: 0.2 10*3/uL (ref 0.0–0.5)
Eosinophils Relative: 3 %
HCT: 41.9 % (ref 36.0–46.0)
Hemoglobin: 13.3 g/dL (ref 12.0–15.0)
Immature Granulocytes: 0 %
Lymphocytes Relative: 22 %
Lymphs Abs: 1.5 10*3/uL (ref 0.7–4.0)
MCH: 29.8 pg (ref 26.0–34.0)
MCHC: 31.7 g/dL (ref 30.0–36.0)
MCV: 93.7 fL (ref 80.0–100.0)
Monocytes Absolute: 0.9 10*3/uL (ref 0.1–1.0)
Monocytes Relative: 12 %
Neutro Abs: 4.3 10*3/uL (ref 1.7–7.7)
Neutrophils Relative %: 62 %
Platelets: 155 10*3/uL (ref 150–400)
RBC: 4.47 MIL/uL (ref 3.87–5.11)
RDW: 13.7 % (ref 11.5–15.5)
WBC: 7 10*3/uL (ref 4.0–10.5)
nRBC: 0 % (ref 0.0–0.2)

## 2019-01-21 LAB — COMPREHENSIVE METABOLIC PANEL
ALT: 21 U/L (ref 0–44)
AST: 24 U/L (ref 15–41)
Albumin: 3.4 g/dL — ABNORMAL LOW (ref 3.5–5.0)
Alkaline Phosphatase: 70 U/L (ref 38–126)
Anion gap: 9 (ref 5–15)
BUN: 30 mg/dL — ABNORMAL HIGH (ref 8–23)
CO2: 33 mmol/L — ABNORMAL HIGH (ref 22–32)
Calcium: 8.6 mg/dL — ABNORMAL LOW (ref 8.9–10.3)
Chloride: 96 mmol/L — ABNORMAL LOW (ref 98–111)
Creatinine, Ser: 1.23 mg/dL — ABNORMAL HIGH (ref 0.44–1.00)
GFR calc Af Amer: 49 mL/min — ABNORMAL LOW (ref >60)
GFR calc non Af Amer: 43 mL/min — ABNORMAL LOW (ref >60)
Glucose, Bld: 167 mg/dL — ABNORMAL HIGH (ref 70–99)
Potassium: 2.9 mmol/L — ABNORMAL LOW (ref 3.5–5.1)
Sodium: 138 mmol/L (ref 135–145)
Total Bilirubin: 1.1 mg/dL (ref 0.3–1.2)
Total Protein: 7.1 g/dL (ref 6.5–8.1)

## 2019-01-21 LAB — GLUCOSE, CAPILLARY: Glucose-Capillary: 152 mg/dL — ABNORMAL HIGH (ref 70–99)

## 2019-01-21 MED ORDER — MORPHINE SULFATE (PF) 4 MG/ML IV SOLN
4.0000 mg | Freq: Once | INTRAVENOUS | Status: AC
  Administered 2019-01-21: 4 mg via INTRAVENOUS
  Filled 2019-01-21: qty 1

## 2019-01-21 MED ORDER — ONDANSETRON HCL 4 MG/2ML IJ SOLN
4.0000 mg | Freq: Once | INTRAMUSCULAR | Status: AC
  Administered 2019-01-21: 23:00:00 4 mg via INTRAVENOUS
  Filled 2019-01-21: qty 2

## 2019-01-21 MED ORDER — IOHEXOL 300 MG/ML  SOLN
80.0000 mL | Freq: Once | INTRAMUSCULAR | Status: AC | PRN
  Administered 2019-01-21: 80 mL via INTRAVENOUS

## 2019-01-21 NOTE — ED Notes (Signed)
Placed pt on 2LPM via [place] due to O2 sat being 84% upon this RN walking into room. O2 sat increased to 95%

## 2019-01-21 NOTE — ED Triage Notes (Signed)
Pt states she slipped and fell on Friday afternoon and fell onto hardwood flooring. Pt C/O mid/lower back pain, bilateral hip pain, and abdominal distension. Pt also C/O N/V from the pain.

## 2019-01-22 MED ORDER — ONDANSETRON 4 MG PO TBDP: 4.0000 mg | ORAL_TABLET | Freq: Three times a day (TID) | ORAL | 0 refills | Status: DC | PRN

## 2019-01-22 MED ORDER — HYDROMORPHONE HCL 2 MG PO TABS: 2.0000 mg | ORAL_TABLET | ORAL | 0 refills | Status: DC | PRN

## 2019-01-22 MED ORDER — POTASSIUM CHLORIDE CRYS ER 20 MEQ PO TBCR
40.0000 meq | EXTENDED_RELEASE_TABLET | Freq: Once | ORAL | Status: AC
  Administered 2019-01-22: 01:00:00 40 meq via ORAL
  Filled 2019-01-22: qty 2

## 2019-01-22 MED ORDER — HYDROMORPHONE HCL 2 MG PO TABS
2.0000 mg | ORAL_TABLET | Freq: Once | ORAL | Status: AC
  Administered 2019-01-22: 02:00:00 2 mg via ORAL
  Filled 2019-01-22: qty 1

## 2019-01-22 NOTE — Progress Notes (Signed)
Remote ICD transmission.   

## 2019-01-22 NOTE — ED Provider Notes (Signed)
[facility] EMERGENCY DEPARTMENT Provider Note   CSN: [id number] Arrival date & time: 01/21/19  1927     History   Chief Complaint Chief Complaint  Patient presents with   Fall    HPI Victoria Adams is a 78 y.o. female with a history as outlined below, most significant for atrial fib on Eliquis, CHF, CAD, HTN, mitral regurgitation, ACS with history of nstemi, DM presenting for evaluation of persistent pain both in her back, hip but also abdomen since falling 2 days ago.  She slipped while trying to clean up dog urine in her home and fell backward, landing flat on her back but hitting her head against the wall.  She denies LOC and was helped up by family immediately after the fall. She denies having a headache or neck pain, but endorses having persistent nausea with dry heaves since the fall.  She also reports "hurting all over" but mostly her back/flank region and has noticed abdominal distention and pain since the fall. She denies chest pain or sob, denies neck pain, dysuria, hematuria, also no dizziness or focal weakness.  She has taken dramamine for the nausea without relief.        The history is provided by the patient.    Past Medical History:  Diagnosis Date   Allergic rhinitis    Arthritis    OSTEO   IN SPINE   Atrial fibrillation (HCC)    Chronic low back pain    Secondary to DJD   Chronic systolic heart failure (HCC)    a. echo (5/15):  mod LVH, EF 20-25%, diff HK, severe LAE, mild RAE   Coronary atherosclerosis-non obstructive    LHC (5/15):  EF 40-45% global HK; long LAD 40-60%, ostial 1st major septal perf 80-90%, ostial CFX ?%, mid AVCFX extensive Ca2+, dist PDA 50%   Depression    Essential hypertension, benign    Fatty liver disease, nonalcoholic    GERD (gastroesophageal reflux disease)    Iron deficiency anemia    Mitral regurgitation    NICM (nonischemic cardiomyopathy) ([place])    a. echo (5/15):  mod LVH, EF 20-25%, diff HK, severe LAE, mild  RAE   NSTEMI (non-ST elevated myocardial infarction) ([place]) may 2015   No CAD   Obesity    Osteopenia    PONV (postoperative nausea and vomiting)    Rosacea    Type 2 diabetes mellitus ([place])    Vitamin D deficiency     Patient Active Problem List   Diagnosis Date Noted   Right foot pain    Hyponatremia 06/12/2016   Flu-like symptoms 06/12/2016   AKI (acute kidney injury) ([place]) 06/12/2016   Weakness generalized 06/12/2016   Lactic acid acidosis 06/12/2016   Type 2 diabetes mellitus with complication ([place])    Falls 09/13/2014   CKD (chronic kidney disease), stage III ([place]) 09/13/2014   Benign neoplasm of colon 12/20/2013   Hyperkalemia 10/03/2013   Orthostatic hypotension 10/03/2013   IDA (iron deficiency anemia) 08/24/2013   NSTEMI (non-ST elevated myocardial infarction) ([place]) 08/22/2013   Long term (current) use of anticoagulants 07/20/2010   NICM (nonischemic cardiomyopathy) ([place]) 10/28/2009   LBBB (left bundle branch block) 10/28/2009   Atrial fibrillation ([place]) 10/28/2009   Hyperlipidemia 06/06/2009   Morbid obesity ([place]) 06/06/2009   Essential hypertension, benign 06/06/2009   Atherosclerosis of native coronary artery [id number]   Chronic systolic heart failure ([place]) 06/06/2009    Past Surgical History:  Procedure Laterality Date   ABDOMINAL HYSTERECTOMY  APPENDECTOMY     BREAST BIOPSY     RIGHT   CARDIAC CATHETERIZATION  2010   LAD 50%, CFX 30%, RCA 40%; EF by echo 25-35%   CATARACT EXTRACTION Bilateral    CHOLECYSTECTOMY     COLONOSCOPY WITH PROPOFOL N/A 12/20/2013   Procedure: COLONOSCOPY WITH PROPOFOL;  Surgeon: Milus Banister, MD;  Location: [facility] END[facility];  Service: Endoscopy;  Laterality: N/A;   EP IMPLANTABLE DEVICE N/A 04/16/2016   Procedure: BiV ICD Insertion CRT-D;  Surgeon: Deboraha Sprang, MD;  Location: [facility];  Service: Cardiovascular;  Laterality: N/A;   ESOPHAGOGASTRODUODENOSCOPY N/A 08/25/2013    Procedure: ESOPHAGOGASTRODUODENOSCOPY (EGD);  Surgeon: Milus Banister, MD;  Location: [facility];  Service: Endoscopy;  Laterality: N/A;   LEFT HEART CATHETERIZATION WITH CORONARY ANGIOGRAM N/A 08/24/2013   Procedure: LEFT HEART CATHETERIZATION WITH CORONARY ANGIOGRAM;  Surgeon: Leonie Man, MD;  Location: [facility] CATH LAB;  Service: Cardiovascular;  Laterality: N/A;   ROTATOR CUFF REPAIR     RIGHT SHOULDER   TONSILLECTOMY  yrs ago   TOTAL ABDOMINAL HYSTERECTOMY W/ BILATERAL SALPINGOOPHORECTOMY     for dermoid tumor     OB History   No obstetric history on file.      Home Medications    Prior to Admission medications   Medication Sig Start Date End Date Taking? Authorizing Provider  Calcium Carb-Cholecalciferol (CALCIUM 600 + D PO) Take 1 tablet by mouth 2 (two) times daily.     [provider]  carvedilol (COREG) 25 MG tablet TAKE (1) TABLET TWICE DAILY. 01/16/19   [name], Shaune Pascal, MD  colchicine 0.6 MG tablet Take 1 tablet (0.6 mg total) by mouth daily. 06/16/16   Bonnielee Haff, MD  dimenhyDRINATE (DRAMAMINE) 50 MG tablet Take 50 mg by mouth every 8 (eight) hours as needed for nausea.    [provider]  doxycycline (VIBRAMYCIN) 100 MG capsule Take 100-200 mg by mouth 2 (two) times daily as needed (rosacea).     [provider]  ELIQUIS 5 MG TABS tablet TAKE (1) TABLET TWICE DAILY. 10/04/14   Clegg, Amy D, NP  ENTRESTO 24-26 MG TAKE (1) TABLET TWICE DAILY. 01/01/19   [name], Shaune Pascal, MD  ferrous sulfate 325 (65 FE) MG tablet TAKE 1 TABLET ONCE DAILY WITH BREAKFAST. 10/04/14   Deboraha Sprang, MD  furosemide (LASIX) 80 MG tablet Take 80 mg by mouth 2 (two) times daily.    [provider]  HYDROcodone-acetaminophen [facility]) 10-325 MG tablet  12/22/18   [provider]  HYDROmorphone (DILAUDID) 2 MG tablet Take 1 tablet (2 mg total) by mouth every 4 (four) hours as needed for severe pain. 01/22/19   Tomy Khim, Almyra Free, PA-C  insulin glargine  (LANTUS) 100 UNIT/ML injection Inject 0.6-1 mLs (60-100 Units total) into the skin 2 (two) times daily. 100 units in the morning and 60 units in the evening 06/16/16   Bonnielee Haff, MD  LANTUS SOLOSTAR 100 UNIT/ML Solostar Pen  01/15/19   [provider]  metolazone (ZAROXOLYN) 2.5 MG tablet Take 1 tablet (2.5 mg total) by mouth daily. 05/23/17 08/21/17  [name], Shaune Pascal, MD  metoprolol succinate (TOPROL-XL) 25 MG 24 hr tablet TAKE (1) TABLET BY MOUTH ONCE DAILY. 09/09/17   [name], Shaune Pascal, MD  metroNIDAZOLE (METROGEL) 0.75 % gel Apply 1 application topically 2 (two) times daily as needed (rosacea).    [provider]  mometasone (ELOCON) 0.1 % lotion Apply 1 application topically daily as needed (eczema).  EAR ECZEMA    [provider]  omeprazole (PRILOSEC) 20 MG capsule Take 20 mg by mouth 2 (two) times daily before a meal.    [provider]  ondansetron (ZOFRAN ODT) 4 MG disintegrating tablet Take 1 tablet (4 mg total) by mouth every 8 (eight) hours as needed for nausea or vomiting. 01/22/19   Evalee Jefferson, PA-C  ondansetron (ZOFRAN) 8 MG tablet Take by mouth every 8 (eight) hours as needed for nausea or vomiting.    [provider]  polyethylene glycol (MIRALAX / GLYCOLAX) packet Take 17 g by mouth daily.    [provider]  potassium chloride SA (K-DUR,KLOR-CON) 20 MEQ tablet Take 1 tablet (20 mEq total) by mouth daily. 05/30/17   [name], Shaune Pascal, MD  rosuvastatin (CRESTOR) 20 MG tablet TAKE 1 TABLET BY MOUTH ONCE DAILY. PATIENT NEEDS TO BE SEEN. 12/14/18   [name], Shaune Pascal, MD  sertraline (ZOLOFT) 100 MG tablet Take 100 mg by mouth daily.    [provider]  spironolactone (ALDACTONE) 25 MG tablet Take 0.5 tablets (12.5 mg total) by mouth daily. NEED APPOINTMENT 03/20/18   [name], Shaune Pascal, MD    Family History Family History  Problem Relation Age of Onset   Lung cancer Father    Coronary artery disease Father     Diabetes Father    Lung cancer Mother    Asthma Brother    Other 40 with tetralogy of Fallot and Hirschsprng disease    Social History Social History   Tobacco Use   Smoking status: Never Smoker   Smokeless tobacco: Never Used  Substance Use Topics   Alcohol use: No   Drug use: No     Allergies   Codeine and Tylox [oxycodone-acetaminophen]   Review of Systems Review of Systems  Constitutional: Negative for chills and fever.  HENT: Negative for congestion and sore throat.   Eyes: Negative.  Negative for visual disturbance.  Respiratory: Negative for chest tightness and shortness of breath.   Cardiovascular: Negative for chest pain.  Gastrointestinal: Positive for abdominal distention, abdominal pain and nausea.  Genitourinary: Positive for flank pain.  Musculoskeletal: Positive for back pain. Negative for arthralgias, joint swelling and neck pain.  Skin: Negative.  Negative for rash and wound.  Neurological: Negative for dizziness, weakness, light-headedness, numbness and headaches.  Psychiatric/Behavioral: Negative.      Physical Exam Updated Vital Signs BP 129/86    Pulse 80    Temp 98.6 F (37 C) (Oral)    Resp 18    SpO2 98%   Physical Exam Vitals signs and nursing note reviewed.  Constitutional:      Appearance: She is well-developed.  HENT:     Head: Normocephalic and atraumatic.     Comments: Soreness parietal scalp, no bruising, hematoma or deformity noted. c spine non tender. Eyes:     Conjunctiva/sclera: Conjunctivae normal.  Neck:     Musculoskeletal: Normal range of motion.  Cardiovascular:     Rate and Rhythm: Normal rate and regular rhythm.     Heart sounds: Normal heart sounds.  Pulmonary:     Effort: Pulmonary effort is normal.     Breath sounds: Normal breath sounds. No wheezing.  Abdominal:     General: Abdomen is protuberant. Bowel sounds are normal.     Palpations: Abdomen is soft.     Tenderness:  There is generalized abdominal tenderness. There is no right CVA tenderness, left CVA  tenderness or guarding.  Musculoskeletal: Normal range of motion.       Back:     Comments: ttp entire back without localized pain, no bruising, no cva tenderness.   Skin:    General: Skin is warm and dry.  Neurological:     Mental Status: She is alert.      ED Treatments / Results  Labs (all labs ordered are listed, but only abnormal results are displayed) Labs Reviewed  COMPREHENSIVE METABOLIC PANEL - Abnormal; Notable for the following components:      Result Value   Potassium 2.9 (*)    Chloride 96 (*)    CO2 33 (*)    Glucose, Bld 167 (*)    BUN 30 (*)    Creatinine, Ser 1.23 (*)    Calcium 8.6 (*)    Albumin 3.4 (*)    GFR calc non Af Amer 43 (*)    GFR calc Af Amer 49 (*)    All other components within normal limits  GLUCOSE, CAPILLARY - Abnormal; Notable for the following components:   Glucose-Capillary 152 (*)    All other components within normal limits  CBC WITH DIFFERENTIAL/PLATELET  URINALYSIS, ROUTINE W REFLEX MICROSCOPIC  CBG MONITORING, ED    EKG None  Radiology Dg Lumbar Spine Complete  Result Date: 01/21/2019 CLINICAL DATA:  Mid to low back pain since fall on Friday. EXAM: LUMBAR SPINE - COMPLETE 4+ VIEW COMPARISON:  Chest x-ray dated June 13, 2016. MRI lumbar spine dated May 31, 2012. FINDINGS: There is no evidence of lumbar spine fracture. Chronic mild T12 superior endplate compression deformity. Trace retrolisthesis at L2-L3 and L3-L4. Trace anterolisthesis at L4-L5. New mild disc height loss at L3-L4 and L4-L5. Mild-to-moderate facet arthropathy at L4-L5 and L5-S1. Sacroiliac joints are unremarkable. Aortoiliac and branch vessel atherosclerotic calcification. Prior cholecystectomy. IMPRESSION: 1.  No acute osseous abnormality. Electronically Signed   By: Titus Dubin M.D.   On: 01/21/2019 20:46   Ct Head Wo Contrast  Result Date: 01/21/2019 CLINICAL  DATA:  78 year old female status post slip and fall 2 days ago. Pain, nausea and vomiting. EXAM: CT HEAD WITHOUT CONTRAST CT CERVICAL SPINE WITHOUT CONTRAST TECHNIQUE: Multidetector CT imaging of the head and cervical spine was performed following the standard protocol without intravenous contrast. Multiplanar CT image reconstructions of the cervical spine were also generated. COMPARISON:  Head CT 08/31/2006 [facility] FINDINGS: CT HEAD FINDINGS Brain: Cerebral volume remains normal for age. Patchy bilateral white matter hypodensity has increased and is mild to moderate for age. No midline shift, ventriculomegaly, mass effect, evidence of mass lesion, intracranial hemorrhage or evidence of cortically based acute infarction. No cortical encephalomalacia. Partially empty sella. Vascular: Calcified atherosclerosis at the skull base. No suspicious intracranial vascular hyperdensity. Skull: No acute osseous abnormality identified. Sinuses/Orbits: Visualized paranasal sinuses and mastoids are stable and well pneumatized. Other: Stable orbit and scalp soft tissues. CT CERVICAL SPINE FINDINGS Alignment: Mild straightening of cervical lordosis. Cervicothoracic junction alignment is within normal limits. Bilateral posterior element alignment is within normal limits. Skull base and vertebrae: Visualized skull base is intact. No atlanto-occipital dissociation. No acute osseous abnormality identified. Soft tissues and spinal canal: No prevertebral fluid or swelling. No visible canal hematoma. Calcified carotid atherosclerosis and partially retropharyngeal course of the carotids. Disc levels: Cervical disc and endplate degeneration from C4-C5 through C6-C7. Associated multilevel mild spinal stenosis suspected. Upper chest: Mild chronic appearing T1 superior endplate deformity. Otherwise visible upper thoracic levels appear intact. Negative lung apices  and noncontrast thoracic inlet. IMPRESSION: 1. No acute traumatic  injury identified in the head or cervical spine. 2. Mild to moderate for age cerebral white matter changes most commonly due to small vessel disease. 3. Multilevel degenerative lower cervical spinal stenosis, probably mild. Electronically Signed   By: Genevie Ann M.D.   On: 01/21/2019 23:58   Ct Cervical Spine Wo Contrast  Result Date: 01/21/2019 CLINICAL DATA:  78 year old female status post slip and fall 2 days ago. Pain, nausea and vomiting. EXAM: CT HEAD WITHOUT CONTRAST CT CERVICAL SPINE WITHOUT CONTRAST TECHNIQUE: Multidetector CT imaging of the head and cervical spine was performed following the standard protocol without intravenous contrast. Multiplanar CT image reconstructions of the cervical spine were also generated. COMPARISON:  Head CT 08/31/2006 [facility] FINDINGS: CT HEAD FINDINGS Brain: Cerebral volume remains normal for age. Patchy bilateral white matter hypodensity has increased and is mild to moderate for age. No midline shift, ventriculomegaly, mass effect, evidence of mass lesion, intracranial hemorrhage or evidence of cortically based acute infarction. No cortical encephalomalacia. Partially empty sella. Vascular: Calcified atherosclerosis at the skull base. No suspicious intracranial vascular hyperdensity. Skull: No acute osseous abnormality identified. Sinuses/Orbits: Visualized paranasal sinuses and mastoids are stable and well pneumatized. Other: Stable orbit and scalp soft tissues. CT CERVICAL SPINE FINDINGS Alignment: Mild straightening of cervical lordosis. Cervicothoracic junction alignment is within normal limits. Bilateral posterior element alignment is within normal limits. Skull base and vertebrae: Visualized skull base is intact. No atlanto-occipital dissociation. No acute osseous abnormality identified. Soft tissues and spinal canal: No prevertebral fluid or swelling. No visible canal hematoma. Calcified carotid atherosclerosis and partially retropharyngeal course  of the carotids. Disc levels: Cervical disc and endplate degeneration from C4-C5 through C6-C7. Associated multilevel mild spinal stenosis suspected. Upper chest: Mild chronic appearing T1 superior endplate deformity. Otherwise visible upper thoracic levels appear intact. Negative lung apices and noncontrast thoracic inlet. IMPRESSION: 1. No acute traumatic injury identified in the head or cervical spine. 2. Mild to moderate for age cerebral white matter changes most commonly due to small vessel disease. 3. Multilevel degenerative lower cervical spinal stenosis, probably mild. Electronically Signed   By: Genevie Ann M.D.   On: 01/21/2019 23:58   Ct Abdomen Pelvis W Contrast  Result Date: 01/22/2019 CLINICAL DATA:  History of fall mid to low back pain and hip pain EXAM: CT ABDOMEN AND PELVIS WITH CONTRAST TECHNIQUE: Multidetector CT imaging of the abdomen and pelvis was performed using the standard protocol following bolus administration of intravenous contrast. CONTRAST:  2mL OMNIPAQUE IOHEXOL 300 MG/ML  SOLN COMPARISON:  Radiograph 01/21/2019 FINDINGS: Lower chest: Lung bases demonstrate mild atelectasis at the lingula. No consolidation or pleural effusion. Mild cardiomegaly. Coronary vascular calcification. Partially visualized pacing leads. Hepatobiliary: Calcified granuloma. Status post cholecystectomy. Prominent extrahepatic bile duct likely due to surgical change. No focal hepatic abnormality Pancreas: Unremarkable. No pancreatic ductal dilatation or surrounding inflammatory changes. Spleen: Normal in size without focal abnormality. Accessory splenule adjacent to the anterior spleen. 7 mm soft tissue density along the inferior spleen either a small node or additional splenule. Adrenals/Urinary Tract: Adrenal glands are normal. No hydronephrosis. Cortical scarring left kidney. Intrarenal vascular calcifications. Urinary bladder is unremarkable. Subcentimeter hypodensities within the kidneys too small to  further characterize. Stomach/Bowel: The stomach is nonenlarged. No dilated small bowel. No bowel wall thickening. Diffuse fluid within the colon. No colon wall thickening. Appendix not well seen but no right lower quadrant inflammatory process. Vascular/Lymphatic: Nonaneurysmal aorta. Extensive aortic atherosclerosis. No  significantly enlarged lymph nodes. Reproductive: Status post hysterectomy. No adnexal masses. Other: Negative for free air or free fluid. Musculoskeletal: Probable chronic mild superior endplate deformity at L3. Chronic, mild superior endplate deformity at 624THL. Lucency at the superior endplate of 624THL. osteophyte at T10 and T11 with vertically oriented lucency, best seen on the sagittal series that involves the osteophyte, superior endplate at 624THL and inferior endplate at [id number], suspicious for nondisplaced fracture. IMPRESSION: 1. No CT evidence for acute solid organ injury within the abdomen or pelvis. No free air or free fluid. 2. Bridging osteophyte at T10-T11 on the right side with coronally oriented fracture that appears to involve the osteophyte as well as the adjacent inferior endplate of [id number] and superior endplate of 624THL. Lucency beneath the superior endplate at 624THL with deformity consistent with acute fracture. 3. Chronic appearing superior endplate deformity at 624THL and L3. Electronically Signed   By: Donavan Foil M.D.   On: 01/22/2019 00:16   Dg Hips Bilat W Or Wo Pelvis 3-4 Views  Result Date: 01/21/2019 CLINICAL DATA:  Bilateral hip pain since fall on Friday. EXAM: DG HIP (WITH OR WITHOUT PELVIS) 3-4V BILAT COMPARISON:  Bilateral hip x-rays dated Aug 22, 2013. FINDINGS: There is no evidence of hip fracture or dislocation. There is no evidence of arthropathy or other focal bone abnormality. IMPRESSION: Negative. Electronically Signed   By: Titus Dubin M.D.   On: 01/21/2019 20:47    Procedures Procedures (including critical care time)  Medications Ordered in ED Medications    ondansetron (ZOFRAN) injection 4 mg (4 mg Intravenous Given 01/21/19 2246)  morphine 4 MG/ML injection 4 mg (4 mg Intravenous Given 01/21/19 2246)  iohexol (OMNIPAQUE) 300 MG/ML solution 80 mL (80 mLs Intravenous Contrast Given 01/21/19 2335)  potassium chloride SA (KLOR-CON) CR tablet 40 mEq (40 mEq Oral Given 01/22/19 0034)     Initial Impression / Assessment and Plan / ED Course  I have reviewed the triage vital signs and the nursing notes.  Pertinent labs & imaging results that were available during my care of the patient were reviewed by me and considered in my medical decision making (see chart for details).        Labs and imaging reviewed and discussed with pt at dg at bedside.  Vertebral fx at T10 and 11, subacute at T1 (which pt was aware). She endorses has been taking her hydrocodone 10/325 -2 tabs every 6 hours without pain improvement.  She was prescribed a small amount of dilaudid 2 mg tabs for acute pain relief.  She has seen Dr Sherwood Gambler (but many years ago), referred back to him or Dr. Christella Noa as he is on call for [place].  She has no focal neuro deficits on exam.  Considered tlso for pt, but given body habitus and age, felt this would not be the best approach.  Advised activities as tolerated.    Final Clinical Impressions(s) / ED Diagnoses   Final diagnoses:  Other closed fracture of tenth thoracic vertebra, initial encounter [facility])  Other closed fracture of eleventh thoracic vertebra, initial encounter ([place])  Fall, initial encounter    ED Discharge Orders         Ordered    HYDROmorphone (DILAUDID) 2 MG tablet  Every 4 hours PRN     01/22/19 0137    ondansetron (ZOFRAN ODT) 4 MG disintegrating tablet  Every 8 hours PRN     01/22/19 0137           Evalee Jefferson,  PA-C 01/22/19 0158    Carmin Muskrat, MD 01/22/19 1108

## 2019-01-22 NOTE — Discharge Instructions (Signed)
Call Dr. Sherwood Gambler (since you have seen him before) or his partner Dr. Christella Noa who is on call for [place] today for an office visit for further management of this new injury.   Rest your back as much as possible, minimizing activities that worsens your pain.  You may take the medicine prescribed in place of your hydrocodone which may give better pain relief.  Do not drive within 4 hours of taking this medicine as it will make you drowsy.  I have also prescribed you nausea medicine to take as needed.

## 2019-01-29 ENCOUNTER — Telehealth (INDEPENDENT_AMBULATORY_CARE_PROVIDER_SITE_OTHER): Payer: Medicare Other | Admitting: Internal Medicine

## 2019-01-29 ENCOUNTER — Other Ambulatory Visit: Payer: Self-pay

## 2019-01-29 DIAGNOSIS — I428 Other cardiomyopathies: Secondary | ICD-10-CM

## 2019-01-29 NOTE — Progress Notes (Signed)
Patient did not answer.  Left VM x 2  Will need to reschedule on a day that she is available.

## 2019-01-30 NOTE — Telephone Encounter (Signed)
Attempted call to daughter, [name], [place] and left message for return call.  Patient approved by Dr Caryl Comes and agreed by Dr Rayann Heman to transfer care to Dr Rayann Heman at the [facility] office.

## 2019-01-31 DIAGNOSIS — S22080A Wedge compression fracture of T11-T12 vertebra, initial encounter for closed fracture: Secondary | ICD-10-CM | POA: Diagnosis not present

## 2019-01-31 DIAGNOSIS — Z6841 Body Mass Index (BMI) 40.0 and over, adult: Secondary | ICD-10-CM | POA: Diagnosis not present

## 2019-01-31 DIAGNOSIS — I10 Essential (primary) hypertension: Secondary | ICD-10-CM | POA: Diagnosis not present

## 2019-02-01 ENCOUNTER — Other Ambulatory Visit (HOSPITAL_COMMUNITY): Payer: Self-pay | Admitting: Internal Medicine

## 2019-02-06 ENCOUNTER — Encounter: Payer: Self-pay | Admitting: Internal Medicine

## 2019-02-07 ENCOUNTER — Inpatient Hospital Stay (HOSPITAL_COMMUNITY)
Admission: EM | Admit: 2019-02-07 | Discharge: 2019-02-21 | DRG: 516 | Disposition: A | Discharge status: Home Health | Payer: Medicare Other | Attending: Internal Medicine | Admitting: Internal Medicine

## 2019-02-07 ENCOUNTER — Emergency Department (HOSPITAL_COMMUNITY): Payer: Medicare Other

## 2019-02-07 ENCOUNTER — Other Ambulatory Visit: Payer: Self-pay

## 2019-02-07 ENCOUNTER — Encounter (HOSPITAL_COMMUNITY): Payer: Self-pay | Admitting: Emergency Medicine

## 2019-02-07 DIAGNOSIS — Z885 Allergy status to narcotic agent status: Secondary | ICD-10-CM

## 2019-02-07 DIAGNOSIS — S22069A Unspecified fracture of T7-T8 vertebra, initial encounter for closed fracture: Secondary | ICD-10-CM | POA: Diagnosis not present

## 2019-02-07 DIAGNOSIS — Z833 Family history of diabetes mellitus: Secondary | ICD-10-CM

## 2019-02-07 DIAGNOSIS — Z23 Encounter for immunization: Secondary | ICD-10-CM | POA: Diagnosis not present

## 2019-02-07 DIAGNOSIS — I10 Essential (primary) hypertension: Secondary | ICD-10-CM | POA: Diagnosis present

## 2019-02-07 DIAGNOSIS — I5022 Chronic systolic (congestive) heart failure: Secondary | ICD-10-CM | POA: Diagnosis present

## 2019-02-07 DIAGNOSIS — N183 Chronic kidney disease, stage 3 unspecified: Secondary | ICD-10-CM | POA: Diagnosis present

## 2019-02-07 DIAGNOSIS — S22009A Unspecified fracture of unspecified thoracic vertebra, initial encounter for closed fracture: Secondary | ICD-10-CM

## 2019-02-07 DIAGNOSIS — G8929 Other chronic pain: Secondary | ICD-10-CM | POA: Diagnosis present

## 2019-02-07 DIAGNOSIS — R1084 Generalized abdominal pain: Secondary | ICD-10-CM | POA: Diagnosis not present

## 2019-02-07 DIAGNOSIS — R0600 Dyspnea, unspecified: Secondary | ICD-10-CM | POA: Diagnosis present

## 2019-02-07 DIAGNOSIS — M549 Dorsalgia, unspecified: Secondary | ICD-10-CM

## 2019-02-07 DIAGNOSIS — I130 Hypertensive heart and chronic kidney disease with heart failure and stage 1 through stage 4 chronic kidney disease, or unspecified chronic kidney disease: Secondary | ICD-10-CM | POA: Diagnosis present

## 2019-02-07 DIAGNOSIS — I509 Heart failure, unspecified: Secondary | ICD-10-CM | POA: Diagnosis not present

## 2019-02-07 DIAGNOSIS — W19XXXA Unspecified fall, initial encounter: Secondary | ICD-10-CM | POA: Diagnosis present

## 2019-02-07 DIAGNOSIS — I428 Other cardiomyopathies: Secondary | ICD-10-CM | POA: Diagnosis present

## 2019-02-07 DIAGNOSIS — S22078A Other fracture of T9-T10 vertebra, initial encounter for closed fracture: Secondary | ICD-10-CM | POA: Diagnosis not present

## 2019-02-07 DIAGNOSIS — E559 Vitamin D deficiency, unspecified: Secondary | ICD-10-CM | POA: Diagnosis present

## 2019-02-07 DIAGNOSIS — Z7901 Long term (current) use of anticoagulants: Secondary | ICD-10-CM

## 2019-02-07 DIAGNOSIS — I110 Hypertensive heart disease with heart failure: Secondary | ICD-10-CM | POA: Diagnosis not present

## 2019-02-07 DIAGNOSIS — I2729 Other secondary pulmonary hypertension: Secondary | ICD-10-CM | POA: Diagnosis present

## 2019-02-07 DIAGNOSIS — I4891 Unspecified atrial fibrillation: Secondary | ICD-10-CM | POA: Diagnosis not present

## 2019-02-07 DIAGNOSIS — R109 Unspecified abdominal pain: Secondary | ICD-10-CM | POA: Diagnosis not present

## 2019-02-07 DIAGNOSIS — L719 Rosacea, unspecified: Secondary | ICD-10-CM | POA: Diagnosis present

## 2019-02-07 DIAGNOSIS — S22079A Unspecified fracture of T9-T10 vertebra, initial encounter for closed fracture: Principal | ICD-10-CM | POA: Diagnosis present

## 2019-02-07 DIAGNOSIS — I480 Paroxysmal atrial fibrillation: Secondary | ICD-10-CM | POA: Diagnosis present

## 2019-02-07 DIAGNOSIS — Z419 Encounter for procedure for purposes other than remedying health state, unspecified: Secondary | ICD-10-CM

## 2019-02-07 DIAGNOSIS — I252 Old myocardial infarction: Secondary | ICD-10-CM | POA: Diagnosis not present

## 2019-02-07 DIAGNOSIS — Z801 Family history of malignant neoplasm of trachea, bronchus and lung: Secondary | ICD-10-CM | POA: Diagnosis not present

## 2019-02-07 DIAGNOSIS — E118 Type 2 diabetes mellitus with unspecified complications: Secondary | ICD-10-CM | POA: Diagnosis not present

## 2019-02-07 DIAGNOSIS — S22089A Unspecified fracture of T11-T12 vertebra, initial encounter for closed fracture: Secondary | ICD-10-CM | POA: Diagnosis not present

## 2019-02-07 DIAGNOSIS — K567 Ileus, unspecified: Secondary | ICD-10-CM | POA: Diagnosis not present

## 2019-02-07 DIAGNOSIS — S22000A Wedge compression fracture of unspecified thoracic vertebra, initial encounter for closed fracture: Secondary | ICD-10-CM

## 2019-02-07 DIAGNOSIS — Z950 Presence of cardiac pacemaker: Secondary | ICD-10-CM

## 2019-02-07 DIAGNOSIS — I340 Nonrheumatic mitral (valve) insufficiency: Secondary | ICD-10-CM | POA: Diagnosis present

## 2019-02-07 DIAGNOSIS — I2510 Atherosclerotic heart disease of native coronary artery without angina pectoris: Secondary | ICD-10-CM | POA: Diagnosis present

## 2019-02-07 DIAGNOSIS — K219 Gastro-esophageal reflux disease without esophagitis: Secondary | ICD-10-CM | POA: Diagnosis present

## 2019-02-07 DIAGNOSIS — M8588 Other specified disorders of bone density and structure, other site: Secondary | ICD-10-CM | POA: Diagnosis present

## 2019-02-07 DIAGNOSIS — I5082 Biventricular heart failure: Secondary | ICD-10-CM | POA: Diagnosis present

## 2019-02-07 DIAGNOSIS — Z888 Allergy status to other drugs, medicaments and biological substances status: Secondary | ICD-10-CM

## 2019-02-07 DIAGNOSIS — K760 Fatty (change of) liver, not elsewhere classified: Secondary | ICD-10-CM | POA: Diagnosis present

## 2019-02-07 DIAGNOSIS — M4324 Fusion of spine, thoracic region: Secondary | ICD-10-CM | POA: Diagnosis not present

## 2019-02-07 DIAGNOSIS — Z6841 Body Mass Index (BMI) 40.0 and over, adult: Secondary | ICD-10-CM | POA: Diagnosis not present

## 2019-02-07 DIAGNOSIS — Z794 Long term (current) use of insulin: Secondary | ICD-10-CM | POA: Diagnosis not present

## 2019-02-07 DIAGNOSIS — S22070A Wedge compression fracture of T9-T10 vertebra, initial encounter for closed fracture: Secondary | ICD-10-CM | POA: Diagnosis not present

## 2019-02-07 DIAGNOSIS — Z8249 Family history of ischemic heart disease and other diseases of the circulatory system: Secondary | ICD-10-CM | POA: Diagnosis not present

## 2019-02-07 DIAGNOSIS — Z20828 Contact with and (suspected) exposure to other viral communicable diseases: Secondary | ICD-10-CM | POA: Diagnosis present

## 2019-02-07 DIAGNOSIS — E1122 Type 2 diabetes mellitus with diabetic chronic kidney disease: Secondary | ICD-10-CM | POA: Diagnosis present

## 2019-02-07 DIAGNOSIS — I361 Nonrheumatic tricuspid (valve) insufficiency: Secondary | ICD-10-CM | POA: Diagnosis not present

## 2019-02-07 DIAGNOSIS — Z825 Family history of asthma and other chronic lower respiratory diseases: Secondary | ICD-10-CM

## 2019-02-07 DIAGNOSIS — M47896 Other spondylosis, lumbar region: Secondary | ICD-10-CM | POA: Diagnosis present

## 2019-02-07 DIAGNOSIS — E1165 Type 2 diabetes mellitus with hyperglycemia: Secondary | ICD-10-CM | POA: Diagnosis not present

## 2019-02-07 DIAGNOSIS — S22080A Wedge compression fracture of T11-T12 vertebra, initial encounter for closed fracture: Secondary | ICD-10-CM | POA: Diagnosis not present

## 2019-02-07 DIAGNOSIS — I959 Hypotension, unspecified: Secondary | ICD-10-CM | POA: Diagnosis present

## 2019-02-07 DIAGNOSIS — M546 Pain in thoracic spine: Secondary | ICD-10-CM | POA: Diagnosis not present

## 2019-02-07 DIAGNOSIS — D509 Iron deficiency anemia, unspecified: Secondary | ICD-10-CM | POA: Diagnosis present

## 2019-02-07 DIAGNOSIS — I700 Atherosclerosis of aorta: Secondary | ICD-10-CM | POA: Diagnosis not present

## 2019-02-07 LAB — BASIC METABOLIC PANEL
Anion gap: 6 (ref 5–15)
BUN: 21 mg/dL (ref 8–23)
CO2: 31 mmol/L (ref 22–32)
Calcium: 8.7 mg/dL — ABNORMAL LOW (ref 8.9–10.3)
Chloride: 101 mmol/L (ref 98–111)
Creatinine, Ser: 1.26 mg/dL — ABNORMAL HIGH (ref 0.44–1.00)
GFR calc Af Amer: 48 mL/min — ABNORMAL LOW (ref >60)
GFR calc non Af Amer: 41 mL/min — ABNORMAL LOW (ref >60)
Glucose, Bld: 160 mg/dL — ABNORMAL HIGH (ref 70–99)
Potassium: 4 mmol/L (ref 3.5–5.1)
Sodium: 138 mmol/L (ref 135–145)

## 2019-02-07 LAB — CBC
HCT: 42.6 % (ref 36.0–46.0)
Hemoglobin: 13.3 g/dL (ref 12.0–15.0)
MCH: 29.9 pg (ref 26.0–34.0)
MCHC: 31.2 g/dL (ref 30.0–36.0)
MCV: 95.7 fL (ref 80.0–100.0)
Platelets: 199 10*3/uL (ref 150–400)
RBC: 4.45 MIL/uL (ref 3.87–5.11)
RDW: 14.2 % (ref 11.5–15.5)
WBC: 6.1 10*3/uL (ref 4.0–10.5)
nRBC: 0 % (ref 0.0–0.2)

## 2019-02-07 LAB — CBG MONITORING, ED: Glucose-Capillary: 150 mg/dL — ABNORMAL HIGH (ref 70–99)

## 2019-02-07 LAB — TROPONIN I (HIGH SENSITIVITY)
Troponin I (High Sensitivity): 11 ng/L (ref <18)
Troponin I (High Sensitivity): 13 ng/L (ref <18)

## 2019-02-07 LAB — BRAIN NATRIURETIC PEPTIDE: B Natriuretic Peptide: 160 pg/mL — ABNORMAL HIGH (ref 0.0–100.0)

## 2019-02-07 MED ORDER — POLYETHYLENE GLYCOL 3350 17 G PO PACK: 17.0000 g | PACK | Freq: Every day | ORAL | Status: DC | PRN

## 2019-02-07 MED ORDER — IOHEXOL 350 MG/ML SOLN
100.0000 mL | Freq: Once | INTRAVENOUS | Status: AC | PRN
  Administered 2019-02-07: 100 mL via INTRAVENOUS

## 2019-02-07 MED ORDER — METRONIDAZOLE 0.75 % EX GEL
1.0000 "application " | Freq: Two times a day (BID) | CUTANEOUS | Status: DC | PRN
  Filled 2019-02-07: qty 45

## 2019-02-07 MED ORDER — COLCHICINE 0.6 MG PO TABS
0.6000 mg | ORAL_TABLET | Freq: Every day | ORAL | Status: DC
  Administered 2019-02-08 – 2019-02-21 (×13): 0.6 mg via ORAL
  Filled 2019-02-07 (×13): qty 1

## 2019-02-07 MED ORDER — DOCUSATE SODIUM 100 MG PO CAPS
100.0000 mg | ORAL_CAPSULE | Freq: Two times a day (BID) | ORAL | Status: DC
  Administered 2019-02-07 – 2019-02-21 (×25): 100 mg via ORAL
  Filled 2019-02-07 (×27): qty 1

## 2019-02-07 MED ORDER — SODIUM CHLORIDE 0.9% FLUSH
3.0000 mL | Freq: Once | INTRAVENOUS | Status: AC
  Administered 2019-02-07: 3 mL via INTRAVENOUS

## 2019-02-07 MED ORDER — HYDROMORPHONE HCL 1 MG/ML IJ SOLN
1.0000 mg | INTRAMUSCULAR | Status: DC | PRN
  Administered 2019-02-07 – 2019-02-19 (×69): 1 mg via INTRAVENOUS
  Filled 2019-02-07 (×69): qty 1

## 2019-02-07 MED ORDER — ACETAMINOPHEN 650 MG RE SUPP: 650.0000 mg | Freq: Four times a day (QID) | RECTAL | Status: DC | PRN

## 2019-02-07 MED ORDER — FERROUS SULFATE 325 (65 FE) MG PO TABS: 325.0000 mg | ORAL_TABLET | Freq: Every day | ORAL | Status: DC

## 2019-02-07 MED ORDER — SACUBITRIL-VALSARTAN 24-26 MG PO TABS
1.0000 | ORAL_TABLET | Freq: Two times a day (BID) | ORAL | Status: DC
  Administered 2019-02-08 – 2019-02-16 (×16): 1 via ORAL
  Filled 2019-02-07 (×18): qty 1

## 2019-02-07 MED ORDER — FUROSEMIDE 10 MG/ML IJ SOLN
80.0000 mg | Freq: Two times a day (BID) | INTRAMUSCULAR | Status: DC
  Filled 2019-02-07: qty 8

## 2019-02-07 MED ORDER — SERTRALINE HCL 100 MG PO TABS
100.0000 mg | ORAL_TABLET | Freq: Every day | ORAL | Status: DC
  Administered 2019-02-08 – 2019-02-21 (×13): 100 mg via ORAL
  Filled 2019-02-07 (×13): qty 1

## 2019-02-07 MED ORDER — SPIRONOLACTONE 12.5 MG HALF TABLET
12.5000 mg | ORAL_TABLET | Freq: Every day | ORAL | Status: DC
  Administered 2019-02-09 – 2019-02-16 (×7): 12.5 mg via ORAL
  Filled 2019-02-07 (×9): qty 1

## 2019-02-07 MED ORDER — POLYETHYLENE GLYCOL 3350 17 G PO PACK
17.0000 g | PACK | Freq: Every day | ORAL | Status: DC
  Administered 2019-02-08 – 2019-02-14 (×4): 17 g via ORAL
  Filled 2019-02-07 (×7): qty 1

## 2019-02-07 MED ORDER — POTASSIUM CHLORIDE CRYS ER 20 MEQ PO TBCR
20.0000 meq | EXTENDED_RELEASE_TABLET | Freq: Every day | ORAL | Status: DC
  Administered 2019-02-08 – 2019-02-16 (×8): 20 meq via ORAL
  Filled 2019-02-07 (×8): qty 1

## 2019-02-07 MED ORDER — PANTOPRAZOLE SODIUM 40 MG PO TBEC
40.0000 mg | DELAYED_RELEASE_TABLET | Freq: Every day | ORAL | Status: DC
  Administered 2019-02-08 – 2019-02-21 (×13): 40 mg via ORAL
  Filled 2019-02-07 (×13): qty 1

## 2019-02-07 MED ORDER — SODIUM CHLORIDE 0.9 % IV SOLN: 250.0000 mL | INTRAVENOUS | Status: DC | PRN

## 2019-02-07 MED ORDER — SODIUM CHLORIDE 0.9% FLUSH
3.0000 mL | Freq: Two times a day (BID) | INTRAVENOUS | Status: DC
  Administered 2019-02-07 – 2019-02-21 (×26): 3 mL via INTRAVENOUS

## 2019-02-07 MED ORDER — ACETAMINOPHEN 325 MG PO TABS
650.0000 mg | ORAL_TABLET | Freq: Four times a day (QID) | ORAL | Status: DC | PRN
  Administered 2019-02-10 – 2019-02-20 (×8): 650 mg via ORAL
  Filled 2019-02-07 (×9): qty 2

## 2019-02-07 MED ORDER — APIXABAN 5 MG PO TABS
5.0000 mg | ORAL_TABLET | Freq: Two times a day (BID) | ORAL | Status: DC
  Administered 2019-02-08: 5 mg via ORAL
  Filled 2019-02-07: qty 1

## 2019-02-07 MED ORDER — CARVEDILOL 25 MG PO TABS: 25.0000 mg | ORAL_TABLET | Freq: Two times a day (BID) | ORAL | Status: DC

## 2019-02-07 MED ORDER — SODIUM CHLORIDE 0.9% FLUSH
3.0000 mL | INTRAVENOUS | Status: DC | PRN
  Administered 2019-02-12: 3 mL via INTRAVENOUS
  Filled 2019-02-07: qty 3

## 2019-02-07 MED ORDER — INSULIN ASPART 100 UNIT/ML ~~LOC~~ SOLN
0.0000 [IU] | SUBCUTANEOUS | Status: DC
  Administered 2019-02-07 – 2019-02-08 (×3): 1 [IU] via SUBCUTANEOUS
  Administered 2019-02-08: 3 [IU] via SUBCUTANEOUS
  Administered 2019-02-08 – 2019-02-10 (×5): 2 [IU] via SUBCUTANEOUS
  Filled 2019-02-07: qty 1

## 2019-02-07 MED ORDER — ROSUVASTATIN CALCIUM 20 MG PO TABS
20.0000 mg | ORAL_TABLET | Freq: Every day | ORAL | Status: DC
  Administered 2019-02-08 – 2019-02-21 (×13): 20 mg via ORAL
  Filled 2019-02-07 (×13): qty 1

## 2019-02-07 MED ORDER — FUROSEMIDE 10 MG/ML IJ SOLN
80.0000 mg | Freq: Once | INTRAMUSCULAR | Status: AC
  Administered 2019-02-07: 80 mg via INTRAVENOUS
  Filled 2019-02-07: qty 8

## 2019-02-07 MED ORDER — ONDANSETRON HCL 4 MG/2ML IJ SOLN
4.0000 mg | Freq: Four times a day (QID) | INTRAMUSCULAR | Status: DC | PRN
  Administered 2019-02-07 – 2019-02-19 (×8): 4 mg via INTRAVENOUS
  Filled 2019-02-07 (×8): qty 2

## 2019-02-07 NOTE — ED Notes (Signed)
Awaiting call back concerning TLSO Brace

## 2019-02-07 NOTE — H&P (Addendum)
TRH H&P    Patient Demographics:    Victoria Adams, is a 78 y.o. female  MRN: [id number]  DOB - 07-04-1940  Admit Date - 02/07/2019  Referring MD/NP/PA:  Alvino Chapel  Outpatient Primary MD for the patient is Caryl Bis, MD  Patient coming from:  home  Chief complaint- dyspnea, back pain   HPI:    Victoria Adams  is a 78 y.o. female, w hypertension, Dm2,  CAD s/p NSTEMI, Pafib, CHF (EF 65%),  h/o ICD, Mitral regurgitation,  , Gerd, Iron deficiency anemia, Vitamin D def, Osteopenia, h/o T-Spine compression fractures, apparently presents with c/o Fall 2 weeks ago. Pt notes increase in back pain since that time.  Pt denies fever, chills, wt loss, numbness, tingling, weakness.  Pt notes dyspnea for the past few weeks, worse for the past 2 days, along with increase in lower ext edema.  Pt denies cough, cp, palp, abd pain, diarrhea, brbpr, dysuria.   In ED,  T 98.1, P 70 R 18, Bp 140/70 pox 98% on RA WT 120.2kg  CTA chest IMPRESSION: 1. No evidence of pulmonary embolism. Borderline enlargement of the central pulmonary arteries, which can be seen with pulmonary arterial hypertension. 2. Evidence of right-sided heart failure with right atrial enlargement and reflux of contrast into the hepatic veins. 3. Acute interval worsening of the known fracture deformities of the inferior endplate T10 and superior endplate T11 with now with transosseous extension through the posterior tension band with distracted fracture of the spinous process of T10 and lucency seen the posterior elements of T11. Further evaluation with MRI is recommended. 4. Question additional nondisplaced spinous process fractures of T7 and T8. 5. Stable superior endplate compression deformity at T12. 6. Aortic Atherosclerosis (ICD10-I70.0).  Na 138, K 4.0 Bun 21, Creatinine 1.26  Wbc 6.1, hgb 13.3, Plt 199 Trop 11 BNP 160.0   EKG  Pending   ED spoke with neurosurgery who recommended MRI and transfer to [facility] and back brace.   Pt will be admitted for intractable back pain and dyspnea.     Review of systems:    In addition to the HPI above,  No Fever-chills, No Headache, No changes with Vision or hearing, No problems swallowing food or Liquids, No Chest pain, Cough or Shortness of Breath, No Abdominal pain, No Nausea or Vomiting, bowel movements are regular, No Blood in stool or Urine, No dysuria, No new skin rashes or bruises, No new joints pains-aches,  No new weakness, tingling, numbness in any extremity, No recent weight gain or loss, No polyuria, polydypsia or polyphagia, No significant Mental Stressors.  All other systems reviewed and are negative.    Past History of the following :    Past Medical History:  Diagnosis Date  . Allergic rhinitis   . 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 Atrial fibrillation ([place])   . Chronic low back pain    Secondary to DJD  . Chronic systolic heart failure (HCC)    a. echo (5/15):  mod LVH, EF 20-25%,  diff HK, severe LAE, mild RAE  . Coronary atherosclerosis-non obstructive    LHC (5/15):  EF 40-45% global HK; long LAD 40-60%, ostial 1st major septal perf 80-90%, ostial CFX ?%, mid AVCFX extensive Ca2+, dist PDA 50%  . Depression   . Essential hypertension, benign   . Fatty liver disease, nonalcoholic   . GERD (gastroesophageal reflux disease)   . Iron deficiency anemia   . Mitral regurgitation   . NICM (nonischemic cardiomyopathy) ([place])    a. echo (5/15):  mod LVH, EF 20-25%, diff HK, severe LAE, mild RAE  . NSTEMI (non-ST elevated myocardial infarction) ([place]) may 2015   No CAD  . Obesity   . Osteopenia   . PONV (postoperative nausea and vomiting)   . Rosacea   . Type 2 diabetes mellitus ([place])   . Vitamin D deficiency       Past Surgical History:  Procedure Laterality Date  . ABDOMINAL HYSTERECTOMY    . APPENDECTOMY    . BREAST BIOPSY     RIGHT  . CARDIAC  CATHETERIZATION  2010   LAD 50%, CFX 30%, RCA 40%; EF by echo 25-35%  . CATARACT EXTRACTION Bilateral   . CHOLECYSTECTOMY    . COLONOSCOPY WITH PROPOFOL N/A 12/20/2013   Procedure: COLONOSCOPY WITH PROPOFOL;  Surgeon: Milus Banister, MD;  Location: [facility] END[facility];  Service: Endoscopy;  Laterality: N/A;  . EP IMPLANTABLE DEVICE N/A 04/16/2016   Procedure: BiV ICD Insertion CRT-D;  Surgeon: Deboraha Sprang, MD;  Location: [place] CV LAB;  Service: Cardiovascular;  Laterality: N/A;  . ESOPHAGOGASTRODUODENOSCOPY N/A 08/25/2013   Procedure: ESOPHAGOGASTRODUODENOSCOPY (EGD);  Surgeon: Milus Banister, MD;  Location: [place];  Service: Endoscopy;  Laterality: N/A;  . LEFT HEART CATHETERIZATION WITH CORONARY ANGIOGRAM N/A 08/24/2013   Procedure: LEFT HEART CATHETERIZATION WITH CORONARY ANGIOGRAM;  Surgeon: Leonie Man, MD;  Location: [facility] CATH LAB;  Service: Cardiovascular;  Laterality: N/A;  . ROTATOR CUFF REPAIR     RIGHT SHOULDER  . TONSILLECTOMY  yrs ago  . TOTAL ABDOMINAL HYSTERECTOMY W/ BILATERAL SALPINGOOPHORECTOMY     for dermoid tumor      Social History:      Social History   Tobacco Use  . Smoking status: Never Smoker  . Smokeless tobacco: Never Used  Substance Use Topics  . Alcohol use: No       Family History :     Family History  Problem Relation Age of Onset  . Lung cancer Father   . Coronary artery disease Father   . Diabetes Father   . Lung cancer Mother   . Asthma Brother   . Other Grandchild        Grandchild with tetralogy of Fallot and Hirschsprng disease       Home Medications:   Prior to Admission medications   Medication Sig Start Date End Date Taking? Authorizing Provider  Calcium Carb-Cholecalciferol (CALCIUM 600 + D PO) Take 1 tablet by mouth 2 (two) times daily.     [provider]  carvedilol (COREG) 25 MG tablet TAKE (1) TABLET TWICE DAILY. 01/16/19   [name], Shaune Pascal, MD  colchicine 0.6 MG tablet Take 1 tablet (0.6 mg total)  by mouth daily. 06/16/16   Bonnielee Haff, MD  dimenhyDRINATE (DRAMAMINE) 50 MG tablet Take 50 mg by mouth every 8 (eight) hours as needed for nausea.    [provider]  doxycycline (VIBRAMYCIN) 100 MG capsule Take 100-200 mg by mouth 2 (two) times daily as  needed (rosacea).     [provider]  ELIQUIS 5 MG TABS tablet TAKE (1) TABLET TWICE DAILY. 10/04/14   Clegg, Amy D, NP  ENTRESTO 24-26 MG TAKE (1) TABLET TWICE DAILY. 01/01/19   [name], Shaune Pascal, MD  ferrous sulfate 325 (65 FE) MG tablet TAKE 1 TABLET ONCE DAILY WITH BREAKFAST. 10/04/14   Deboraha Sprang, MD  furosemide (LASIX) 80 MG tablet Take 80 mg by mouth 2 (two) times daily.    [provider]  HYDROcodone-acetaminophen [facility]) 10-325 MG tablet  12/22/18   [provider]  HYDROmorphone (DILAUDID) 2 MG tablet Take 1 tablet (2 mg total) by mouth every 4 (four) hours as needed for severe pain. 01/22/19   Idol, Almyra Free, PA-C  insulin glargine (LANTUS) 100 UNIT/ML injection Inject 0.6-1 mLs (60-100 Units total) into the skin 2 (two) times daily. 100 units in the morning and 60 units in the evening 06/16/16   Bonnielee Haff, MD  LANTUS SOLOSTAR 100 UNIT/ML Solostar Pen  01/15/19   [provider]  metolazone (ZAROXOLYN) 2.5 MG tablet Take 1 tablet (2.5 mg total) by mouth daily. 05/23/17 08/21/17  [name], Shaune Pascal, MD  metoprolol succinate (TOPROL-XL) 25 MG 24 hr tablet TAKE (1) TABLET BY MOUTH ONCE DAILY. 09/09/17   [name], Shaune Pascal, MD  metroNIDAZOLE (METROGEL) 0.75 % gel Apply 1 application topically 2 (two) times daily as needed (rosacea).    [provider]  mometasone (ELOCON) 0.1 % lotion Apply 1 application topically daily as needed (eczema). EAR ECZEMA    [provider]  omeprazole (PRILOSEC) 20 MG capsule Take 20 mg by mouth 2 (two) times daily before a meal.    [provider]  ondansetron (ZOFRAN ODT) 4 MG disintegrating tablet Take 1 tablet (4 mg total) by mouth  every 8 (eight) hours as needed for nausea or vomiting. 01/22/19   Evalee Jefferson, PA-C  ondansetron (ZOFRAN) 8 MG tablet Take by mouth every 8 (eight) hours as needed for nausea or vomiting.    [provider]  polyethylene glycol (MIRALAX / GLYCOLAX) packet Take 17 g by mouth daily.    [provider]  potassium chloride SA (K-DUR,KLOR-CON) 20 MEQ tablet Take 1 tablet (20 mEq total) by mouth daily. 05/30/17   [name], Shaune Pascal, MD  rosuvastatin (CRESTOR) 20 MG tablet TAKE 1 TABLET ONCE DAILY. 02/02/19   [name], Shaune Pascal, MD  sertraline (ZOLOFT) 100 MG tablet Take 100 mg by mouth daily.    [provider]  spironolactone (ALDACTONE) 25 MG tablet Take 0.5 tablets (12.5 mg total) by mouth daily. NEED APPOINTMENT 03/20/18   [name], Shaune Pascal, MD     Allergies:     Allergies  Allergen Reactions  . Codeine Nausea And Vomiting  . Tylox [Oxycodone-Acetaminophen]     Vision closing in & heart palpitations.      Physical Exam:   Vitals  Blood pressure 140/70, pulse 70, temperature 98.1 F (36.7 C), temperature source Oral, resp. rate 18, height 5\' 7"  (1.702 m), weight 120.2 kg, SpO2 98 %.  1.  General: axoxo3  2. Psychiatric: euthymic  3. Neurologic: cn2-12 intact, reflexes 2+ symmetric, diffuse with no clonus, motor 5/5 in all 4 ext  4. HEENMT:  Anicteric, pupils 1.45mm symmetric, direct, consensual, near intact Neck: no jvd  5. Respiratory : Slight coarse bs at the right lung base, no wheezing,   6. Cardiovascular : rrr s1, s2,   7. Gastrointestinal:  Abd: soft, nt, nd, +bs  8. Skin:  Ext: no c/c/ 1+ edema , no rash  9.Musculoskeletal:  Good ROM    Data Review:    CBC Recent Labs  Lab 02/07/19 1644  WBC 6.1  HGB 13.3  HCT 42.6  PLT 199  MCV 95.7  MCH 29.9  MCHC 31.2  RDW 14.2   ------------------------------------------------------------------------------------------------------------------  Results for orders placed or  performed during the hospital encounter of 02/07/19 (from the past 48 hour(s))  Basic metabolic panel     Status: Abnormal   Collection Time: 02/07/19  4:44 PM  Result Value Ref Range   Sodium 138 135 - 145 mmol/L   Potassium 4.0 3.5 - 5.1 mmol/L   Chloride 101 98 - 111 mmol/L   CO2 31 22 - 32 mmol/L   Glucose, Bld 160 (H) 70 - 99 mg/dL   BUN 21 8 - 23 mg/dL   Creatinine, Ser 1.26 (H) 0.44 - 1.00 mg/dL   Calcium 8.7 (L) 8.9 - 10.3 mg/dL   GFR calc non Af Amer 41 (L) >60 mL/min   GFR calc Af Amer 48 (L) >60 mL/min   Anion gap 6 5 - 15    Comment: Performed at [facility], [address] [place], [place] [address]  CBC     Status: None   Collection Time: 02/07/19  4:44 PM  Result Value Ref Range   WBC 6.1 4.0 - 10.5 K/uL   RBC 4.45 3.87 - 5.11 MIL/uL   Hemoglobin 13.3 12.0 - 15.0 g/dL   HCT 42.6 36.0 - 46.0 %   MCV 95.7 80.0 - 100.0 fL   MCH 29.9 26.0 - 34.0 pg   MCHC 31.2 30.0 - 36.0 g/dL   RDW 14.2 11.5 - 15.5 %   Platelets 199 150 - 400 K/uL   nRBC 0.0 0.0 - 0.2 %    Comment: Performed at [facility], [address] [place], [place] [address]  Troponin I (High Sensitivity)     Status: None   Collection Time: 02/07/19  4:44 PM  Result Value Ref Range   Troponin I (High Sensitivity) 11 <18 ng/L    Comment: (NOTE) Elevated high sensitivity troponin I (hsTnI) values and significant  changes across serial measurements may suggest ACS but many other  chronic and acute conditions are known to elevate hsTnI results.  Refer to the "Links" section for chest pain algorithms and additional  guidance. Performed at [facility], [address] [place], [place] [address]   Brain natriuretic peptide     Status: Abnormal   Collection Time: 02/07/19  4:44 PM  Result Value Ref Range   B Natriuretic Peptide 160.0 (H) 0.0 - 100.0 pg/mL    Comment: Performed at [facility], [address] [place], [place] [address]    Chemistries  Recent Labs  Lab 02/07/19 1644  NA 138  K 4.0  CL  101  CO2 31  GLUCOSE 160*  BUN 21  CREATININE 1.26*  CALCIUM 8.7*   ------------------------------------------------------------------------------------------------------------------  ------------------------------------------------------------------------------------------------------------------ GFR: Estimated Creatinine Clearance: 51 mL/min (A) (by C-G formula based on SCr of 1.26 mg/dL (H)). Liver Function Tests: No results for input(s): AST, ALT, ALKPHOS, BILITOT, PROT, ALBUMIN in the last 168 hours. No results for input(s): LIPASE, AMYLASE in the last 168 hours. No results for input(s): AMMONIA in the last 168 hours. Coagulation Profile: No results for input(s): INR, PROTIME in the last 168 hours. Cardiac Enzymes: No results for input(s): CKTOTAL, CKMB, CKMBINDEX, TROPONINI in the last 168 hours. BNP (last  3 results) No results for input(s): PROBNP in the last 8760 hours. HbA1C: No results for input(s): HGBA1C in the last 72 hours. CBG: No results for input(s): GLUCAP in the last 168 hours. Lipid Profile: No results for input(s): CHOL, HDL, LDLCALC, TRIG, CHOLHDL, LDLDIRECT in the last 72 hours. Thyroid Function Tests: No results for input(s): TSH, T4TOTAL, FREET4, T3FREE, THYROIDAB in the last 72 hours. Anemia Panel: No results for input(s): VITAMINB12, FOLATE, FERRITIN, TIBC, IRON, RETICCTPCT in the last 72 hours.  --------------------------------------------------------------------------------------------------------------- Urine analysis:    Component Value Date/Time   COLORURINE YELLOW 06/13/2016 1601   APPEARANCEUR CLEAR 06/13/2016 1601   LABSPEC 1.010 06/13/2016 1601   PHURINE 5.0 06/13/2016 1601   GLUCOSEU 150 (A) 06/13/2016 1601   HGBUR NEGATIVE 06/13/2016 1601   BILIRUBINUR NEGATIVE 06/13/2016 1601   KETONESUR NEGATIVE 06/13/2016 1601   PROTEINUR NEGATIVE 06/13/2016 1601   UROBILINOGEN 1.0 01/10/2014 1545   NITRITE NEGATIVE 06/13/2016 1601    LEUKOCYTESUR NEGATIVE 06/13/2016 1601      Imaging Results:    Dg Chest 2 View  Result Date: 02/07/2019 CLINICAL DATA:  CHF EXAM: CHEST - 2 VIEW COMPARISON:  11/19/2016 FINDINGS: Cardiomegaly with left chest multi lead pacer defibrillator. Both lungs are clear. Disc degenerative disease of the thoracic spine. IMPRESSION: Cardiomegaly without acute abnormality of the lungs. No focal airspace opacity. Electronically Signed   By: Eddie Candle M.D.   On: 02/07/2019 17:00   Ct Angio Chest Pe W And/or Wo Contrast  Result Date: 02/07/2019 CLINICAL DATA:  PE suspected, high pretest probability, history of CHF EXAM: CT ANGIOGRAPHY CHEST WITH CONTRAST TECHNIQUE: Multidetector CT imaging of the chest was performed using the standard protocol during bolus administration of intravenous contrast. Multiplanar CT image reconstructions and MIPs were obtained to evaluate the vascular anatomy. CONTRAST:  166mL OMNIPAQUE IOHEXOL 350 MG/ML SOLN COMPARISON:  Chest radiograph 02/07/2019, CT abdomen pelvis 01/21/2019 FINDINGS: Cardiovascular: Satisfactory opacification of the pulmonary arteries to the segmental level. No pulmonary arterial filling defects are identified. Borderline enlargement of the central pulmonary arteries. RV/LV ratio is normal (0.6) mild cardiomegaly with predominantly right atrial enlargement. Reflux of contrast into the hepatic veins. Extensive coronary artery calcifications are present. Cardiac pacer/defibrillator pack overlies the left chest wall. Defibrillation lead position at the cardiac apex. Additional leads at the right atrium and coronary sinus. Limited opacification of the aorta. Ascending thoracic aorta is upper limits of normal caliber. Atherosclerotic plaque throughout the aorta and proximal great vessels which are otherwise unremarkable. Mild distension of the azygous veins. Mediastinum/Nodes: Thyroid gland and thoracic inlet are unremarkable. No acute abnormality of the trachea or  esophagus. No mediastinal, hilar axillary adenopathy. Lungs/Pleura: Bandlike areas of scarring are present in the lingula and lower lobes. No consolidation, features of edema, pneumothorax, or effusion. No suspicious pulmonary nodules or masses. Upper Abdomen: Patient is post cholecystectomy. Small accessory splenules are noted. Left renal atrophy is present. Extensive vascular calcium noted in the upper abdomen. Musculoskeletal: Multilevel flowing anterior osteophytosis, compatible with features of diffuse idiopathic skeletal hyperostosis (DISH). There is irregular lucency through the inferior endplate T10 and superior endplate T11 concerning for acute fracture deformities at these of levels. There is osseous extension through the posterior tension band with distracted fracture of the spinous process of T10 and lucency seen the posterior elements of T11. Question additional lucency through the tips of the spinous processes of T7 and T8 as well. There is stable superior endplate cupping at 624THL. No other acute or suspicious osseous lesions. Bones  are diffusely demineralized. Review of the MIP images confirms the above findings. IMPRESSION: 1. No evidence of pulmonary embolism. Borderline enlargement of the central pulmonary arteries, which can be seen with pulmonary arterial hypertension. 2. Evidence of right-sided heart failure with right atrial enlargement and reflux of contrast into the hepatic veins. 3. Acute interval worsening of the known fracture deformities of the inferior endplate T10 and superior endplate T11 with now with transosseous extension through the posterior tension band with distracted fracture of the spinous process of T10 and lucency seen the posterior elements of T11. Further evaluation with MRI is recommended. 4. Question additional nondisplaced spinous process fractures of T7 and T8. 5. Stable superior endplate compression deformity at T12. 6. Aortic Atherosclerosis (ICD10-I70.0). These  results were called by telephone at the time of interpretation on 02/07/2019 at 7:40 pm to provider Davonna Belling , who verbally acknowledged these results. Electronically Signed   By: Lovena Le M.D.   On: 02/07/2019 19:43      Assessment & Plan:    Principal Problem:   Thoracic compression fracture [facility]) Active Problems:   Essential hypertension, benign   Atrial fibrillation (HCC)   Type 2 diabetes mellitus with complication (HCC)   Dyspnea  T spine compression fracture Dilaudid 1mg  iv q3h prn  Not sure if can do MRI due to ICD Please consult neurosurgery in [place] to see what type of imaging they would like  Dyspnea ? From pulmonary hypertension Check cardiac echo  Pafib Cont Carvedilol 25mg  po bid Cont Eliquis pharmacy to dose  Chronic CHF (EF 65%) Cont Carvedilol as above Cont Entresto 24-26mg  po bid Start Lasix 80mg  iv bid  Dm2 Hold Lantus fsbs q4h, ISS  Gerd Cont PPI  H/o Iron def anemia Cont ferrous sulfate  Constipation Cont Miralax 17gm po qday Start Colace 100mg  po bid Start Miralax 17gm po qday prn   Nausea Cont Zofran 4mg  iv q6h prn   DVT Prophylaxis-   Eliquis, may need to hold if neurosurgery considering kyphoplast/ vertebroplasty  AM Labs Ordered, also please review Full Orders  Family Communication: Admission, patients condition and plan of care including tests being ordered have been discussed with the patient  who indicate understanding and agree with the plan and Code Status.  Code Status:  FULL CODE per patient,  Daughter is present in ED with patient   Admission status:  inpatient: Based on patients clinical presentation and evaluation of above clinical data, I have made determination that patient meets Inpatient criteria at this time. Pt has high risk of clinical deterioration,  Pt will require iv lasix for edema, refractory to oral lasix, and metolazone, and pt will require iv pain medication due to intractable pain, as well as  possibly vertebroplasty/kyphoplastyy  Time spent in minutes :  70 minutes   Jani Gravel M.D on 02/07/2019 at 8:38 PM

## 2019-02-07 NOTE — Progress Notes (Signed)
ANTICOAGULATION CONSULT NOTE - Initial Consult  Pharmacy Consult for apixaban Indication: atrial fibrillation  Allergies  Allergen Reactions  . Codeine Nausea And Vomiting  . Tylox [Oxycodone-Acetaminophen]     Vision closing in & heart palpitations.     Patient Measurements: Height: 5\' 7"  (170.2 cm) Weight: 265 lb (120.2 kg) IBW/kg (Calculated) : 61.6 Heparin Dosing Weight:   Vital Signs: Temp: 98.1 F (36.7 C) (10/28 1542) Temp Source: Oral (10/28 1542) BP: 175/75 (10/28 2100) Pulse Rate: 77 (10/28 2100)  Labs: Recent Labs    02/07/19 1644 02/07/19 2013  HGB 13.3  --   HCT 42.6  --   PLT 199  --   CREATININE 1.26*  --   TROPONINIHS 11 13    Estimated Creatinine Clearance: 51 mL/min (A) (by C-G formula based on SCr of 1.26 mg/dL (H)).   Medical History: Past Medical History:  Diagnosis Date  . Allergic rhinitis   . 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 Atrial fibrillation ([place])   . Chronic low back pain    Secondary to DJD  . Chronic systolic heart failure (HCC)    a. echo (5/15):  mod LVH, EF 20-25%, diff HK, severe LAE, mild RAE  . Coronary atherosclerosis-non obstructive    LHC (5/15):  EF 40-45% global HK; long LAD 40-60%, ostial 1st major septal perf 80-90%, ostial CFX ?%, mid AVCFX extensive Ca2+, dist PDA 50%  . Depression   . Essential hypertension, benign   . Fatty liver disease, nonalcoholic   . GERD (gastroesophageal reflux disease)   . Iron deficiency anemia   . Mitral regurgitation   . NICM (nonischemic cardiomyopathy) ([place])    a. echo (5/15):  mod LVH, EF 20-25%, diff HK, severe LAE, mild RAE  . NSTEMI (non-ST elevated myocardial infarction) ([place]) may 2015   No CAD  . Obesity   . Osteopenia   . PONV (postoperative nausea and vomiting)   . Rosacea   . Type 2 diabetes mellitus ([place])   . Vitamin D deficiency     Medications:  (Not in a hospital admission)  Scheduled:  . docusate sodium  100 mg Oral BID  . [START ON 02/08/2019]  furosemide  80 mg Intravenous BID  . insulin aspart  0-9 Units Subcutaneous Q4H  . sodium chloride flush  3 mL Intravenous Q12H   PRN: sodium chloride, acetaminophen **OR** acetaminophen, HYDROmorphone (DILAUDID) injection, ondansetron (ZOFRAN) IV, polyethylene glycol, sodium chloride flush  Assessment: Victoria Adams has a hx of afib who was admitted for dyspnea and back pain. She has been on apixaban for anticoagulation. Age<80, wt>60kg, scr<1.5. She is on the appropriate dose. CBC wnl  Goal of Therapy:  Monitor platelets by anticoagulation protocol: Yes   Plan:  Continue apixaban 5mg  PO BID Monitor for bleeding  Onnie Boer, PharmD, BCIDP, AAHIVP, CPP Infectious Disease Pharmacist 02/07/2019 9:30 PM

## 2019-02-07 NOTE — ED Provider Notes (Signed)
[facility] EMERGENCY DEPARTMENT Provider Note   CSN: [id number] Arrival date & time: 02/07/19  1458     History   Chief Complaint Chief Complaint  Patient presents with  . Congestive Heart Failure    HPI Victoria Adams is a 78 y.o. female.     HPI Patient presents with back pain chest pain shortness of breath and decreased urine output.  Has swelling in her legs.  Had a fall a couple weeks ago and had two lower thoracic compression fractures.  States pain uncontrolled with medicines at home.  States she is also having pain in the back and the chest.  Worse with breathing.  States it does feel somewhat like when she has had a heart attack in the past.  No fevers.  Shortness of breath is worse with movement.  States also worse with breathing.  More swelling in her legs than normal.  States she is also had decreased urine output.  She is on anticoagulation for atrial fibrillation. Past Medical History:  Diagnosis Date  . Allergic rhinitis   . 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 Atrial fibrillation ([place])   . Chronic low back pain    Secondary to DJD  . Chronic systolic heart failure (HCC)    a. echo (5/15):  mod LVH, EF 20-25%, diff HK, severe LAE, mild RAE  . Coronary atherosclerosis-non obstructive    LHC (5/15):  EF 40-45% global HK; long LAD 40-60%, ostial 1st major septal perf 80-90%, ostial CFX ?%, mid AVCFX extensive Ca2+, dist PDA 50%  . Depression   . Essential hypertension, benign   . Fatty liver disease, nonalcoholic   . GERD (gastroesophageal reflux disease)   . Iron deficiency anemia   . Mitral regurgitation   . NICM (nonischemic cardiomyopathy) ([place])    a. echo (5/15):  mod LVH, EF 20-25%, diff HK, severe LAE, mild RAE  . NSTEMI (non-ST elevated myocardial infarction) ([place]) may 2015   No CAD  . Obesity   . Osteopenia   . PONV (postoperative nausea and vomiting)   . Rosacea   . Type 2 diabetes mellitus ([place])   . Vitamin D deficiency     Patient Active  Problem List   Diagnosis Date Noted  . Right foot pain   . Hyponatremia 06/12/2016  . Flu-like symptoms 06/12/2016  . AKI (acute kidney injury) ([place]) 06/12/2016  . Weakness generalized 06/12/2016  . Lactic acid acidosis 06/12/2016  . Type 2 diabetes mellitus with complication ([place])   . Falls 09/13/2014  . CKD (chronic kidney disease), stage III ([place]) 09/13/2014  . Benign neoplasm of colon 12/20/2013  . Hyperkalemia 10/03/2013  . Orthostatic hypotension 10/03/2013  . IDA (iron deficiency anemia) 08/24/2013  . NSTEMI (non-ST elevated myocardial infarction) ([place]) 08/22/2013  . Long term (current) use of anticoagulants 07/20/2010  . NICM (nonischemic cardiomyopathy) ([place]) 10/28/2009  . LBBB (left bundle branch block) 10/28/2009  . Atrial fibrillation ([place]) 10/28/2009  . Hyperlipidemia 06/06/2009  . Morbid obesity ([place]) 06/06/2009  . Essential hypertension, benign 06/06/2009  . Atherosclerosis of native coronary artery 06/06/2009  . Chronic systolic heart failure ([place]) 06/06/2009    Past Surgical History:  Procedure Laterality Date  . ABDOMINAL HYSTERECTOMY    . APPENDECTOMY    . BREAST BIOPSY     RIGHT  . CARDIAC CATHETERIZATION  2010   LAD 50%, CFX 30%, RCA 40%; EF by echo 25-35%  . CATARACT EXTRACTION Bilateral   . CHOLECYSTECTOMY    .  COLONOSCOPY WITH PROPOFOL N/A 12/20/2013   Procedure: COLONOSCOPY WITH PROPOFOL;  Surgeon: Milus Banister, MD;  Location: [facility];  Service: Endoscopy;  Laterality: N/A;  . EP IMPLANTABLE DEVICE N/A 04/16/2016   Procedure: BiV ICD Insertion CRT-D;  Surgeon: Deboraha Sprang, MD;  Location: [place] [facility];  Service: Cardiovascular;  Laterality: N/A;  . ESOPHAGOGASTRODUODENOSCOPY N/A 08/25/2013   Procedure: ESOPHAGOGASTRODUODENOSCOPY (EGD);  Surgeon: Milus Banister, MD;  Location: [place];  Service: Endoscopy;  Laterality: N/A;  . LEFT HEART CATHETERIZATION WITH CORONARY ANGIOGRAM N/A 08/24/2013   Procedure: LEFT HEART CATHETERIZATION  WITH CORONARY ANGIOGRAM;  Surgeon: Leonie Man, MD;  Location: [facility] CATH LAB;  Service: Cardiovascular;  Laterality: N/A;  . ROTATOR CUFF REPAIR     RIGHT SHOULDER  . TONSILLECTOMY  yrs ago  . TOTAL ABDOMINAL HYSTERECTOMY W/ BILATERAL SALPINGOOPHORECTOMY     for dermoid tumor     OB History    Gravida  4   Para  4   Term  4   Preterm      AB      Living        SAB      TAB      Ectopic      Multiple      Live Births               Home Medications    Prior to Admission medications   Medication Sig Start Date End Date Taking? Authorizing Provider  Calcium Carb-Cholecalciferol (CALCIUM 600 + D PO) Take 1 tablet by mouth 2 (two) times daily.     [provider]  carvedilol (COREG) 25 MG tablet TAKE (1) TABLET TWICE DAILY. 01/16/19   [name], Shaune Pascal, MD  colchicine 0.6 MG tablet Take 1 tablet (0.6 mg total) by mouth daily. 06/16/16   Bonnielee Haff, MD  dimenhyDRINATE (DRAMAMINE) 50 MG tablet Take 50 mg by mouth every 8 (eight) hours as needed for nausea.    [provider]  doxycycline (VIBRAMYCIN) 100 MG capsule Take 100-200 mg by mouth 2 (two) times daily as needed (rosacea).     [provider]  ELIQUIS 5 MG TABS tablet TAKE (1) TABLET TWICE DAILY. 10/04/14   Clegg, Amy D, NP  ENTRESTO 24-26 MG TAKE (1) TABLET TWICE DAILY. 01/01/19   [name], Shaune Pascal, MD  ferrous sulfate 325 (65 FE) MG tablet TAKE 1 TABLET ONCE DAILY WITH BREAKFAST. 10/04/14   Deboraha Sprang, MD  furosemide (LASIX) 80 MG tablet Take 80 mg by mouth 2 (two) times daily.    [provider]  HYDROcodone-acetaminophen [facility]) 10-325 MG tablet  12/22/18   [provider]  HYDROmorphone (DILAUDID) 2 MG tablet Take 1 tablet (2 mg total) by mouth every 4 (four) hours as needed for severe pain. 01/22/19   Idol, Almyra Free, PA-C  insulin glargine (LANTUS) 100 UNIT/ML injection Inject 0.6-1 mLs (60-100 Units total) into the skin 2 (two) times daily. 100 units in the  morning and 60 units in the evening 06/16/16   Bonnielee Haff, MD  LANTUS SOLOSTAR 100 UNIT/ML Solostar Pen  01/15/19   [provider]  metolazone (ZAROXOLYN) 2.5 MG tablet Take 1 tablet (2.5 mg total) by mouth daily. 05/23/17 08/21/17  [name], Shaune Pascal, MD  metoprolol succinate (TOPROL-XL) 25 MG 24 hr tablet TAKE (1) TABLET BY MOUTH ONCE DAILY. 09/09/17   [name], Shaune Pascal, MD  metroNIDAZOLE (METROGEL) 0.75 % gel Apply 1 application topically 2 (two) times daily  as needed (rosacea).    [provider]  mometasone (ELOCON) 0.1 % lotion Apply 1 application topically daily as needed (eczema). EAR ECZEMA    [provider]  omeprazole (PRILOSEC) 20 MG capsule Take 20 mg by mouth 2 (two) times daily before a meal.    [provider]  ondansetron (ZOFRAN ODT) 4 MG disintegrating tablet Take 1 tablet (4 mg total) by mouth every 8 (eight) hours as needed for nausea or vomiting. 01/22/19   Evalee Jefferson, PA-C  ondansetron (ZOFRAN) 8 MG tablet Take by mouth every 8 (eight) hours as needed for nausea or vomiting.    [provider]  polyethylene glycol (MIRALAX / GLYCOLAX) packet Take 17 g by mouth daily.    [provider]  potassium chloride SA (K-DUR,KLOR-CON) 20 MEQ tablet Take 1 tablet (20 mEq total) by mouth daily. 05/30/17   [name], Shaune Pascal, MD  rosuvastatin (CRESTOR) 20 MG tablet TAKE 1 TABLET ONCE DAILY. 02/02/19   [name], Shaune Pascal, MD  sertraline (ZOLOFT) 100 MG tablet Take 100 mg by mouth daily.    [provider]  spironolactone (ALDACTONE) 25 MG tablet Take 0.5 tablets (12.5 mg total) by mouth daily. NEED APPOINTMENT 03/20/18   [name], Shaune Pascal, MD    Family History Family History  Problem Relation Age of Onset  . Lung cancer Father   . Coronary artery disease Father   . Diabetes Father   . Lung cancer Mother   . Asthma Brother   . Other Grandchild        Grandchild with tetralogy of Fallot and Hirschsprng disease     Social History Social History   Tobacco Use  . Smoking status: Never Smoker  . Smokeless tobacco: Never Used  Substance Use Topics  . Alcohol use: No  . Drug use: No     Allergies   Codeine and Tylox [oxycodone-acetaminophen]   Review of Systems Review of Systems  Constitutional: Negative for appetite change.  HENT: Negative for congestion.   Respiratory: Positive for shortness of breath.   Cardiovascular: Positive for chest pain and leg swelling.  Gastrointestinal: Negative for abdominal pain.  Genitourinary: Negative for flank pain.  Musculoskeletal: Positive for back pain.  Skin: Negative for rash and wound.  Neurological: Positive for weakness.  Psychiatric/Behavioral: Negative for confusion.     Physical Exam Updated Vital Signs BP 140/70 (BP Location: Right Arm)   Pulse 70   Temp 98.1 F (36.7 C) (Oral)   Resp 18   Ht 5\' 7"  (1.702 m)   Wt 120.2 kg   SpO2 98%   BMI 41.50 kg/m   Physical Exam Vitals signs and nursing note reviewed.  Constitutional:      Appearance: She is obese.  HENT:     Head: Atraumatic.     Mouth/Throat:     Mouth: Mucous membranes are moist.  Eyes:     Extraocular Movements: Extraocular movements intact.  Neck:     Musculoskeletal: Neck supple.  Cardiovascular:     Rate and Rhythm: Regular rhythm.  Pulmonary:     Breath sounds: No wheezing, rhonchi or rales.  Chest:     Chest wall: Tenderness present.  Abdominal:     Tenderness: There is no abdominal tenderness.  Musculoskeletal:     Right lower leg: Edema present.     Left lower leg: Edema present.     Comments: Tenderness over lower thoracic spine.  No rash.  No deformity.  Moderate pitting edema bilateral lower  extremities, but worse on the right side.  Skin:    Capillary Refill: Capillary refill takes less than 2 seconds.  Neurological:     Mental Status: She is oriented to person, place, and time.      ED Treatments / Results  Labs (all labs ordered are  listed, but only abnormal results are displayed) Labs Reviewed  BASIC METABOLIC PANEL - Abnormal; Notable for the following components:      Result Value   Glucose, Bld 160 (*)    Creatinine, Ser 1.26 (*)    Calcium 8.7 (*)    GFR calc non Af Amer 41 (*)    GFR calc Af Amer 48 (*)    All other components within normal limits  BRAIN NATRIURETIC PEPTIDE - Abnormal; Notable for the following components:   B Natriuretic Peptide 160.0 (*)    All other components within normal limits  SARS CORONAVIRUS 2 (TAT 6-24 HRS)  CBC  TROPONIN I (HIGH SENSITIVITY)  TROPONIN I (HIGH SENSITIVITY)    EKG EKG Interpretation  Date/Time:  Wednesday February 07 2019 15:49:50 EDT Ventricular Rate:  67 PR Interval:    QRS Duration: 156 QT Interval:  484 QTC Calculation: 511 R Axis:   -137 Text Interpretation: Ventricular-paced rhythm Abnormal ECG Confirmed by Davonna Belling [phone]) on 02/07/2019 5:53:50 PM   Radiology Dg Chest 2 View  Result Date: 02/07/2019 CLINICAL DATA:  CHF EXAM: CHEST - 2 VIEW COMPARISON:  11/19/2016 FINDINGS: Cardiomegaly with left chest multi lead pacer defibrillator. Both lungs are clear. Disc degenerative disease of the thoracic spine. IMPRESSION: Cardiomegaly without acute abnormality of the lungs. No focal airspace opacity. Electronically Signed   By: Eddie Candle M.D.   On: 02/07/2019 17:00   Ct Angio Chest Pe W And/or Wo Contrast  Result Date: 02/07/2019 CLINICAL DATA:  PE suspected, high pretest probability, history of CHF EXAM: CT ANGIOGRAPHY CHEST WITH CONTRAST TECHNIQUE: Multidetector CT imaging of the chest was performed using the standard protocol during bolus administration of intravenous contrast. Multiplanar CT image reconstructions and MIPs were obtained to evaluate the vascular anatomy. CONTRAST:  179mL OMNIPAQUE IOHEXOL 350 MG/ML SOLN COMPARISON:  Chest radiograph 02/07/2019, CT abdomen pelvis 01/21/2019 FINDINGS: Cardiovascular: Satisfactory opacification  of the pulmonary arteries to the segmental level. No pulmonary arterial filling defects are identified. Borderline enlargement of the central pulmonary arteries. RV/LV ratio is normal (0.6) mild cardiomegaly with predominantly right atrial enlargement. Reflux of contrast into the hepatic veins. Extensive coronary artery calcifications are present. Cardiac pacer/defibrillator pack overlies the left chest wall. Defibrillation lead position at the cardiac apex. Additional leads at the right atrium and coronary sinus. Limited opacification of the aorta. Ascending thoracic aorta is upper limits of normal caliber. Atherosclerotic plaque throughout the aorta and proximal great vessels which are otherwise unremarkable. Mild distension of the azygous veins. Mediastinum/Nodes: Thyroid gland and thoracic inlet are unremarkable. No acute abnormality of the trachea or esophagus. No mediastinal, hilar axillary adenopathy. Lungs/Pleura: Bandlike areas of scarring are present in the lingula and lower lobes. No consolidation, features of edema, pneumothorax, or effusion. No suspicious pulmonary nodules or masses. Upper Abdomen: Patient is post cholecystectomy. Small accessory splenules are noted. Left renal atrophy is present. Extensive vascular calcium noted in the upper abdomen. Musculoskeletal: Multilevel flowing anterior osteophytosis, compatible with features of diffuse idiopathic skeletal hyperostosis (DISH). There is irregular lucency through the inferior endplate T10 and superior endplate T11 concerning for acute fracture deformities at these of levels. There is osseous extension through the posterior  tension band with distracted fracture of the spinous process of T10 and lucency seen the posterior elements of T11. Question additional lucency through the tips of the spinous processes of T7 and T8 as well. There is stable superior endplate cupping at 624THL. No other acute or suspicious osseous lesions. Bones are diffusely  demineralized. Review of the MIP images confirms the above findings. IMPRESSION: 1. No evidence of pulmonary embolism. Borderline enlargement of the central pulmonary arteries, which can be seen with pulmonary arterial hypertension. 2. Evidence of right-sided heart failure with right atrial enlargement and reflux of contrast into the hepatic veins. 3. Acute interval worsening of the known fracture deformities of the inferior endplate T10 and superior endplate T11 with now with transosseous extension through the posterior tension band with distracted fracture of the spinous process of T10 and lucency seen the posterior elements of T11. Further evaluation with MRI is recommended. 4. Question additional nondisplaced spinous process fractures of T7 and T8. 5. Stable superior endplate compression deformity at T12. 6. Aortic Atherosclerosis (ICD10-I70.0). These results were called by telephone at the time of interpretation on 02/07/2019 at 7:40 pm to provider Davonna Belling , who verbally acknowledged these results. Electronically Signed   By: Lovena Le M.D.   On: 02/07/2019 19:43    Procedures Procedures (including critical care time)  Medications Ordered in ED Medications  sodium chloride flush (NS) 0.9 % injection 3 mL (3 mLs Intravenous Given 02/07/19 1912)  iohexol (OMNIPAQUE) 350 MG/ML injection 100 mL (100 mLs Intravenous Contrast Given 02/07/19 1851)     Initial Impression / Assessment and Plan / ED Course  I have reviewed the triage vital signs and the nursing notes.  Pertinent labs & imaging results that were available during my care of the patient were reviewed by me and considered in my medical decision making (see chart for details).        Patient with back pain or shortness of breath.  History of CHF and likely has a component of this.  However worsening fractures on CT scan of the chest.  No pulmonary embolisms but potentially unstable fracture.  Discussed with neurosurgery.   They will see patient in consult and recommend TLSO brace.  Will need MRI.  However I think with comorbidities and need for neurosurgery patient benefit from transfer to [facility].  Will discuss with hospitalist.  Final Clinical Impressions(s) / ED Diagnoses   Final diagnoses:  Mult fractures of thoracic spine, closed, initial encounter ([place])  Congestive heart failure, unspecified HF chronicity, unspecified heart failure type [facility])    ED Discharge Orders    None       Davonna Belling, MD 02/07/19 2007

## 2019-02-07 NOTE — ED Triage Notes (Signed)
Patient reports CHF, states her kidneys are not working correctly and she has had minimal urinary output. Edema noted to bilat lower extremities. Patient reports symptoms became worse 2 days ago.

## 2019-02-08 ENCOUNTER — Inpatient Hospital Stay (HOSPITAL_COMMUNITY): Payer: Medicare Other

## 2019-02-08 DIAGNOSIS — I361 Nonrheumatic tricuspid (valve) insufficiency: Secondary | ICD-10-CM

## 2019-02-08 DIAGNOSIS — M549 Dorsalgia, unspecified: Secondary | ICD-10-CM

## 2019-02-08 DIAGNOSIS — M546 Pain in thoracic spine: Secondary | ICD-10-CM

## 2019-02-08 DIAGNOSIS — I509 Heart failure, unspecified: Secondary | ICD-10-CM

## 2019-02-08 LAB — GLUCOSE, CAPILLARY
Glucose-Capillary: 134 mg/dL — ABNORMAL HIGH (ref 70–99)
Glucose-Capillary: 145 mg/dL — ABNORMAL HIGH (ref 70–99)
Glucose-Capillary: 159 mg/dL — ABNORMAL HIGH (ref 70–99)
Glucose-Capillary: 200 mg/dL — ABNORMAL HIGH (ref 70–99)
Glucose-Capillary: 223 mg/dL — ABNORMAL HIGH (ref 70–99)
Glucose-Capillary: 96 mg/dL (ref 70–99)
Glucose-Capillary: 97 mg/dL (ref 70–99)

## 2019-02-08 LAB — SARS CORONAVIRUS 2 (TAT 6-24 HRS): SARS Coronavirus 2: NEGATIVE

## 2019-02-08 LAB — ECHOCARDIOGRAM COMPLETE
Height: 67 in
Weight: 4097.6 oz

## 2019-02-08 MED ORDER — HEPARIN SODIUM (PORCINE) 5000 UNIT/ML IJ SOLN
5000.0000 [IU] | Freq: Three times a day (TID) | INTRAMUSCULAR | Status: DC
  Administered 2019-02-08 – 2019-02-21 (×36): 5000 [IU] via SUBCUTANEOUS
  Filled 2019-02-08 (×36): qty 1

## 2019-02-08 MED ORDER — INFLUENZA VAC A&B SA ADJ QUAD 0.5 ML IM PRSY
0.5000 mL | PREFILLED_SYRINGE | INTRAMUSCULAR | Status: AC
  Administered 2019-02-09: 0.5 mL via INTRAMUSCULAR
  Filled 2019-02-08: qty 0.5

## 2019-02-08 MED ORDER — LORAZEPAM 0.5 MG PO TABS: 0.5000 mg | ORAL_TABLET | Freq: Once | ORAL | Status: DC

## 2019-02-08 MED ORDER — FERROUS SULFATE 325 (65 FE) MG PO TABS
325.0000 mg | ORAL_TABLET | Freq: Every day | ORAL | Status: DC
  Administered 2019-02-08 – 2019-02-21 (×13): 325 mg via ORAL
  Filled 2019-02-08 (×13): qty 1

## 2019-02-08 MED ORDER — PERFLUTREN LIPID MICROSPHERE
1.0000 mL | INTRAVENOUS | Status: AC | PRN
  Administered 2019-02-08: 2 mL via INTRAVENOUS
  Filled 2019-02-08: qty 10

## 2019-02-08 MED ORDER — CARVEDILOL 25 MG PO TABS
25.0000 mg | ORAL_TABLET | Freq: Two times a day (BID) | ORAL | Status: DC
  Administered 2019-02-08 – 2019-02-10 (×5): 25 mg via ORAL
  Filled 2019-02-08: qty 1
  Filled 2019-02-08: qty 2
  Filled 2019-02-08 (×3): qty 1

## 2019-02-08 MED ORDER — LORAZEPAM 2 MG/ML IJ SOLN
0.5000 mg | Freq: Once | INTRAMUSCULAR | Status: AC
  Administered 2019-02-08: 0.5 mg via INTRAVENOUS
  Filled 2019-02-08: qty 1

## 2019-02-08 NOTE — Progress Notes (Signed)
PROGRESS NOTE  Victoria Adams [id number] DOB: 1941/04/03 DOA: 02/07/2019 PCP: Caryl Bis, MD  Brief History   78 year old woman multiple medical problems presented with increased back pain.  Imaging showed worsening of compression fractures in the thoracic spine, concerning for unstable fracture.  She was transferred to [facility] for neurosurgery evaluation.  A & P  T10-T11 compression fracture progressed compared to 2 weeks ago with associated severe back pain. --Neurosurgery recommends MRI if possible, continue bedrest for now with thoracic brace, bone density evaluation.  Treatment might include bracing or potentially kyphoplasty.  Surgery not recommended at this point.  Evidence of right-sided heart failure on CT with bilateral lower extremity edema on exam.  Last echocardiogram 2018 with normal LVEF, low normal RV function. --No shortness of breath currently.  No hypoxia. --EKG showed paced rhythm.  High-sensitivity troponins were negative.  CT angiogram of the chest was negative for PE. --Continue carvedilol.  Hold Entresto today given borderline low blood pressure. --hold Lasix given borderline low BP --Follow-up echocardiogram, if abnormal consider cardiology consultation  Paroxysmal atrial fibrillation --Continue carvedilol.  Hold apixaban until final surgical recommendations are known. --Heparin subcu  Diabetes mellitus type 2 --Lantus on hold.  Continue sliding scale insulin.  DVT prophylaxis: heparin SQ Code Status: Full Family Communication: none Disposition Plan: home    Murray Hodgkins, MD  [facility] Direct contact: see [email or web address] (further directions at bottom of note if needed) 7PM-7AM contact night coverage as at bottom of note 02/08/2019, 3:48 PM  LOS: 1 day   Significant Hospital Events   . 10/28 transfer from [facility] for neurosurgical evaluation of T10-T11 compression fracture   Consults:  . Neurosurgery   Procedures:  .    Significant Diagnostic Tests:  . 10/28 CT chest no PE.  Right-sided heart failure with right atrial enlargement.  No worsening of known fracture deformities T10 and T11.  Additional nondisplaced spinous process fractures T7 and T8 questioned.   . 10/29 CT thoracic spine: Acute fractures T10 and T11 with fracture extension through T10 spinous process . 10/29 echocardiogram:   Micro Data:  .    Antimicrobials:  .   Interval History/Subjective  Feels okay so long as she does not move in bed.  Able to move both legs.  Sensation in both legs grossly intact.  Breathing okay.  No chest pain.  Objective   Vitals:  Vitals:   02/08/19 0810 02/08/19 1149  BP: (!) 106/41 (!) 95/46  Pulse:  73  Resp:  18  Temp:  98.3 F (36.8 C)  SpO2:  91%    Exam:  Constitutional:  . Appears calm and comfortable ENMT:  . grossly normal hearing  . Lips appear normal Respiratory:  . CTA bilaterally, no w/r/r.  . Respiratory effort normal.  Cardiovascular:  . RRR, no m/r/g . No LE extremity edema   Musculoskeletal:  . Moves both legs to command Skin:  . No rashes, lesions, ulcers . palpation of skin: no induration or nodules Psychiatric:  . Mental status o Mood, affect appropriate   I have personally reviewed the following:   Today's Data  . CBG stable . From yesterday BMP and CBC were unremarkable.  BNP was minimally elevated at 160.  High-sensitivity troponin negative x2 . SARS Covid pending  Scheduled Meds: . carvedilol  25 mg Oral BID WC  . colchicine  0.6 mg Oral Daily  . docusate sodium  100 mg Oral BID  . ferrous sulfate  325 mg Oral  Q breakfast  . heparin injection (subcutaneous)  5,000 Units Subcutaneous Q8H  . [START ON 02/09/2019] influenza vaccine adjuvanted  0.5 mL Intramuscular Tomorrow-1000  . insulin aspart  0-9 Units Subcutaneous Q4H  . pantoprazole  40 mg Oral Daily  . polyethylene glycol  17 g Oral Daily  . potassium chloride SA  20 mEq Oral Daily  .  rosuvastatin  20 mg Oral Daily  . sacubitril-valsartan  1 tablet Oral BID  . sertraline  100 mg Oral Daily  . sodium chloride flush  3 mL Intravenous Q12H  . spironolactone  12.5 mg Oral Daily   Continuous Infusions: . sodium chloride      Principal Problem:   Thoracic compression fracture (HCC) Active Problems:   Essential hypertension, benign   Atrial fibrillation (HCC)   Type 2 diabetes mellitus with complication (HCC)   Dyspnea   Back pain   LOS: 1 day   How to contact the [facility] Attending or Consulting provider [place] or covering provider during after hours [place], for this patient?  1. Check the care team in [facility] and look for a) attending/consulting [facility] provider listed and b) the [facility] team listed 2. Log into [email or web address] and use [place] universal password to access. If you do not have the password, please contact the hospital operator. 3. Locate the [facility] provider you are looking for under [facility] and page to a number that you can be directly reached. 4. If you still have difficulty reaching the provider, please page the [facility] (Director on Call) for the Hospitalists listed on amion for assistance.

## 2019-02-08 NOTE — Progress Notes (Signed)
Orthopedic Tech Progress Note Patient Details:  TAIBA CASTORINA [birth year] [id number] Called in order to [facility] for a TLSO THORACIC [place] Patient ID: AVAYAH KALKWARF, female   DOB: [birth year], 78 y.o.   MRN: [id number]   [name] 02/08/2019, 8:41 AM

## 2019-02-08 NOTE — Progress Notes (Signed)
Pt came for MRI and ICD was programmed by medtronic and a RN for MRI mode @2 :30pm. Pt unable to tolerate MRI due to pain, Pt's nurse on the floor was notified and called Dr. Sarajane Jews. Dr. Sarajane Jews said bring pt back up to be evaluated because oxygen levels were in mid 80's and RN's did not feel comfortable giving more pain meds with her levels that low.  Pt can reschedule MRI when feeling better.

## 2019-02-08 NOTE — Progress Notes (Signed)
  Echocardiogram 2D Echocardiogram has been performed.  Bobbye Charleston 02/08/2019, 2:00 PM

## 2019-02-08 NOTE — Progress Notes (Signed)
Subjective: Patient reports Complaining of back pain denies any leg pain denies any numbness and tingling that is new for her she has baseline neuropathy.  She has had a little bit of difficulty with urination but she has been able to urinate and has had bowel movements.  Objective: Vital signs in last 24 hours: Temp:  [98.1 F (36.7 C)-99.1 F (37.3 C)] 98.6 F (37 C) (10/29 0317) Pulse Rate:  [65-80] 71 (10/29 0317) Resp:  [14-20] 20 (10/29 0317) BP: (140-175)/(59-75) 153/65 (10/29 0317) SpO2:  [96 %-98 %] 97 % (10/29 0317) Weight:  [116.2 kg-120.2 kg] 116.2 kg (10/29 0309)  Intake/Output from previous day: 10/28 0701 - 10/29 0700 In: 243 [P.O.:240; I.V.:3] Out: 1350 [Urine:1350] Intake/Output this shift: No intake/output data recorded.  Strength 5-5 lower extremities sensation grossly intact to light touch and proprioception she does have some decreased pinprick lower legs stocking distribution consistent with her diabetic neuropathy  Lab Results: Recent Labs    02/07/19 1644  WBC 6.1  HGB 13.3  HCT 42.6  PLT 199   BMET Recent Labs    02/07/19 1644  NA 138  K 4.0  CL 101  CO2 31  GLUCOSE 160*  BUN 21  CREATININE 1.26*  CALCIUM 8.7*    Studies/Results: Dg Chest 2 View  Result Date: 02/07/2019 CLINICAL DATA:  CHF EXAM: CHEST - 2 VIEW COMPARISON:  11/19/2016 FINDINGS: Cardiomegaly with left chest multi lead pacer defibrillator. Both lungs are clear. Disc degenerative disease of the thoracic spine. IMPRESSION: Cardiomegaly without acute abnormality of the lungs. No focal airspace opacity. Electronically Signed   By: Eddie Candle M.D.   On: 02/07/2019 17:00   Ct Angio Chest Pe W And/or Wo Contrast  Result Date: 02/07/2019 CLINICAL DATA:  PE suspected, high pretest probability, history of CHF EXAM: CT ANGIOGRAPHY CHEST WITH CONTRAST TECHNIQUE: Multidetector CT imaging of the chest was performed using the standard protocol during bolus administration of intravenous  contrast. Multiplanar CT image reconstructions and MIPs were obtained to evaluate the vascular anatomy. CONTRAST:  110mL OMNIPAQUE IOHEXOL 350 MG/ML SOLN COMPARISON:  Chest radiograph 02/07/2019, CT abdomen pelvis 01/21/2019 FINDINGS: Cardiovascular: Satisfactory opacification of the pulmonary arteries to the segmental level. No pulmonary arterial filling defects are identified. Borderline enlargement of the central pulmonary arteries. RV/LV ratio is normal (0.6) mild cardiomegaly with predominantly right atrial enlargement. Reflux of contrast into the hepatic veins. Extensive coronary artery calcifications are present. Cardiac pacer/defibrillator pack overlies the left chest wall. Defibrillation lead position at the cardiac apex. Additional leads at the right atrium and coronary sinus. Limited opacification of the aorta. Ascending thoracic aorta is upper limits of normal caliber. Atherosclerotic plaque throughout the aorta and proximal great vessels which are otherwise unremarkable. Mild distension of the azygous veins. Mediastinum/Nodes: Thyroid gland and thoracic inlet are unremarkable. No acute abnormality of the trachea or esophagus. No mediastinal, hilar axillary adenopathy. Lungs/Pleura: Bandlike areas of scarring are present in the lingula and lower lobes. No consolidation, features of edema, pneumothorax, or effusion. No suspicious pulmonary nodules or masses. Upper Abdomen: Patient is post cholecystectomy. Small accessory splenules are noted. Left renal atrophy is present. Extensive vascular calcium noted in the upper abdomen. Musculoskeletal: Multilevel flowing anterior osteophytosis, compatible with features of diffuse idiopathic skeletal hyperostosis (DISH). There is irregular lucency through the inferior endplate T10 and superior endplate T11 concerning for acute fracture deformities at these of levels. There is osseous extension through the posterior tension band with distracted fracture of the  spinous process of  T10 and lucency seen the posterior elements of T11. Question additional lucency through the tips of the spinous processes of T7 and T8 as well. There is stable superior endplate cupping at 624THL. No other acute or suspicious osseous lesions. Bones are diffusely demineralized. Review of the MIP images confirms the above findings. IMPRESSION: 1. No evidence of pulmonary embolism. Borderline enlargement of the central pulmonary arteries, which can be seen with pulmonary arterial hypertension. 2. Evidence of right-sided heart failure with right atrial enlargement and reflux of contrast into the hepatic veins. 3. Acute interval worsening of the known fracture deformities of the inferior endplate T10 and superior endplate T11 with now with transosseous extension through the posterior tension band with distracted fracture of the spinous process of T10 and lucency seen the posterior elements of T11. Further evaluation with MRI is recommended. 4. Question additional nondisplaced spinous process fractures of T7 and T8. 5. Stable superior endplate compression deformity at T12. 6. Aortic Atherosclerosis (ICD10-I70.0). These results were called by telephone at the time of interpretation on 02/07/2019 at 7:40 pm to provider Davonna Belling , who verbally acknowledged these results. Electronically Signed   By: Lovena Le M.D.   On: 02/07/2019 19:43    Assessment/Plan: T10-T11 compression fracture is somewhat progressed in the last 2 weeks after a fall.  There is some lucency in the posterior elements at that level I do think she needs an MRI scan to make sure these are not pathologic fractures.  She does have a pacemaker if we could, determine the manufacture of the pacemaker to see if it is MRI compatible or whether we can get one.  Continue bedrest for now I have ordered a thoracic brace would recommend bone density evaluation certainly patient is a high risk with her cardiac history of any surgical  intervention so would certainly prefer conservative treatment either with bracing and time or potentially even kyphoplasty if she is a candidate.  The other thing that might be beneficial as we will have radiology re-window her chest CT for thoracic CT.  LOS: 1 day     Kyleigha Markert P 02/08/2019, 7:56 AM

## 2019-02-08 NOTE — Progress Notes (Signed)
[facility] department paged. TLSO brace to be delivered in the morning.

## 2019-02-08 NOTE — CV Procedure (Signed)
2D echo attempted, ortho in room, will try later

## 2019-02-08 NOTE — Progress Notes (Addendum)
Informed of MRI for today.   Device system confirmed to be MRI conditional, with implant date > 6 weeks ago and no evidence of abandoned or epicardial leads in review of most recent CXR Interrogation from today reviewed, pt is currently BiV pacing at 75 bpm Change device settings for MRI to VOO at 85 bpm  Tachy-therapies to off  Program device back to pre-MRI settings after completion of exam.  Shirley Friar, PA-C  02/08/2019 2:38 PM

## 2019-02-09 ENCOUNTER — Inpatient Hospital Stay (HOSPITAL_COMMUNITY): Payer: Medicare Other

## 2019-02-09 DIAGNOSIS — I10 Essential (primary) hypertension: Secondary | ICD-10-CM

## 2019-02-09 LAB — GLUCOSE, CAPILLARY
Glucose-Capillary: 120 mg/dL — ABNORMAL HIGH (ref 70–99)
Glucose-Capillary: 98 mg/dL (ref 70–99)
Glucose-Capillary: 95 mg/dL (ref 70–99)
Glucose-Capillary: 166 mg/dL — ABNORMAL HIGH (ref 70–99)
Glucose-Capillary: 179 mg/dL — ABNORMAL HIGH (ref 70–99)

## 2019-02-09 MED ORDER — HYDROCORTISONE 1 % EX CREA
1.0000 "application " | TOPICAL_CREAM | Freq: Three times a day (TID) | CUTANEOUS | Status: DC | PRN
  Administered 2019-02-09 – 2019-02-14 (×2): 1 via TOPICAL
  Filled 2019-02-09 (×2): qty 28

## 2019-02-09 NOTE — Progress Notes (Signed)
Patient ID: Victoria Adams, female   DOB: 1940/07/14, 78 y.o.   MRN: [id number] Patient with condition of back pain no lower extremity pain  Strength 5/5  TLSO has been delivered recommend upright x-ray in her brace if the upright shows stability with no additional kyphosis or loss of height patient can be mobilized with physical and occupational therapy.

## 2019-02-09 NOTE — Progress Notes (Signed)
Ortho tech paged to come put brace on but said that physical therapy has to do it. Will paged physical therapy.

## 2019-02-09 NOTE — Progress Notes (Signed)
Orthopedic Tech Progress Note Patient Details:  Victoria Adams 20-Aug-1940 [id number] Told RN (who was very rude) that she could have THERAPY apply patient brace or BIOTECH. Patient ID: [name], female   DOB: [birth year], 78 y.o.   MRN: [id number]   [name] 02/09/2019, 2:30 PM

## 2019-02-09 NOTE — Plan of Care (Signed)
  Problem: Coping: Goal: Level of anxiety will decrease Outcome: Completed/Met   Problem: Elimination: Goal: Will not experience complications related to urinary retention Outcome: Completed/Met   

## 2019-02-09 NOTE — Progress Notes (Signed)
PROGRESS NOTE    Victoria Adams  [id number] DOB: [birth year] DOA: 02/07/2019 PCP: Caryl Bis, MD   Brief Narrative: 78 year old female with multiple medical comorbidities presented with increased back pain, imaging showed worsening of compression fracture in the thoracic spine concerning for unstable fracture and was transferred to [facility] for neurosurgery evaluation. Followed by neurosurgery, PT.  Subjective: C/o  Back pain same. Some abdomen side pain b/l. On RA. Walked to [employer] chair at bedside last night-no issues. Had a bowel movement and voiding-denies any incontinence, denies any lower extremity weakness. Could not tolerate Mri due to anxiety and spo2 was also in 80s- now on RA.  "I don't believe I can do Mri today" Lives with 2 kids at home, has WC and WK at home, denies smoking,etoh  Assessment & Plan:  T10-T11Thoracic compression fracture progressed compared to 2 weeks ago with associated severe back pain: Followed by neurosurgery advised MRI-has a BiV pacer and was programmed for MRI yesterday- could not tolerate due to claustrophobia,?drop in spo2. On RA this am. doesn't want to try Mri now. Await further neurosurgery recs- TSLO ordered.  Surgery not recommended at this time.Continue to monitor cont PT/OT. Neuro sx ordered upright xray in brace and if stable can do PT. After x-ray was repeated neurosurgery was contacted and I spoke with the nursing staff who has confirmed with the neurosurgery NP/MD- okay to feed and no procedure planned and okay to order PT OT with the brace on patient-patient was educated and she is agreeable for brace.  Bilateral lower extremity edema: Last echo in 2018 with normal LVEF and normal RV function.  Currently no shortness of breath, EKG is paced rhythm troponin negative CT angio negative for PE.   Cont home carvedilol, entresto, aldactone. Lasix on  hold given borderline low blood pressure 10/29. Echocardiogram 10/29 -Left ventricular ejection  fraction, by visual estimation, is 60 to 65%. The left ventricle has normal function.There is mildly increased left ventricular hypertrophy.  2. Definity contrast agent was given IV to delineate the left ventricular endocardial borders.  3. Abnormal septal motion consistent with RV pacemaker.5. Global right ventricle has normal systolic function.The right ventricular size is normal. No increase in right ventricular wall thickness.  Essential hypertension, benign: Blood pressure fairly stable, Cont COREG.  Paroxysmal atrial fibrillation: Rate is stable, continue Coreg.  Eliquis on hold until final surgical recommendation, pending further imaging  Type 2 diabetes mellitus with complication: Sugar fairly stable Lantus on hold continue sliding scale insulin.  Body mass index is 40.36 kg/m.   DVT prophylaxis:Heparin Code Status: full Family Communication: plan of care discussed with patient at bedside. Disposition Plan: Remains inpatient pending clinical improvement.   Consultants: Neurosurgery  Procedures:  10/28 CT chest no PE.  Right-sided heart failure with right atrial enlargement.  No worsening of known fracture deformities T10 and T11.  Additional nondisplaced spinous process fractures T7 and T8 questioned.    10/29 CT thoracic spine: Acute fractures T10 and T11 with fracture extension through T10 spinous process  10/29 echocardiogram:  1. Left ventricular ejection fraction, by visual estimation, is 60 to 65%. The left ventricle has normal function. There is mildly increased left ventricular hypertrophy.  2. Definity contrast agent was given IV to delineate the left ventricular endocardial borders.  3. Abnormal septal motion consistent with RV pacemaker.5. Global right ventricle has normal systolic function.The right ventricular size is normal. No increase in right ventricular wall thickness.  Microbiology:none  Antimicrobials: Anti-infectives (From admission, onward)  None        Objective: Vitals:   02/08/19 2304 02/09/19 0353 02/09/19 0450 02/09/19 0744  BP: (!) 161/55 (!) 126/51  121/61  Pulse: 79 67  70  Resp: 20 20  18   Temp: 99.7 F (37.6 C) 99 F (37.2 C)  97.9 F (36.6 C)  TempSrc: Oral Oral  Oral  SpO2: 95% 97%  92%  Weight:   116.9 kg   Height:        Intake/Output Summary (Last 24 hours) at 02/09/2019 0748 Last data filed at 02/09/2019 0500 Gross per 24 hour  Intake 1384 ml  Output 601 ml  Net 783 ml   Filed Weights   02/07/19 2252 02/08/19 0309 02/09/19 0450  Weight: 117.5 kg 116.2 kg 116.9 kg   Weight change: -3.311 kg  Body mass index is 40.36 kg/m.  Intake/Output from previous day: 10/29 0701 - 10/30 0700 In: 1384 [P.O.:1384] Out: 601 [Urine:600; Stool:1] Intake/Output this shift: No intake/output data recorded.  Examination:  General exam: AAO,NAD, Weak appearing, obese, on RA. HEENT:Oral mucosa moist, Ear/Nose WNL grossly, dentition normal. Respiratory system: Diminished at the base,no wheezing or crackles,no use of accessory muscle Cardiovascular system: S1 & S2 +, No JVD,. Gastrointestinal system: Abdomen soft, NT,ND, BS+ Nervous System:Alert, awake, moving extremities and grossly nonfocal Extremities: b/l ankle edema +, distal peripheral pulses palpable.  Skin: No rashes,no icterus. MSK: Normal muscle bulk,tone, power  Medications:  Scheduled Meds:  carvedilol  25 mg Oral BID WC   colchicine  0.6 mg Oral Daily   docusate sodium  100 mg Oral BID   ferrous sulfate  325 mg Oral Q breakfast   heparin injection (subcutaneous)  5,000 Units Subcutaneous Q8H   influenza vaccine adjuvanted  0.5 mL Intramuscular Tomorrow-1000   insulin aspart  0-9 Units Subcutaneous Q4H   pantoprazole  40 mg Oral Daily   polyethylene glycol  17 g Oral Daily   potassium chloride SA  20 mEq Oral Daily   rosuvastatin  20 mg Oral Daily   sacubitril-valsartan  1 tablet Oral BID   sertraline  100 mg Oral Daily    sodium chloride flush  3 mL Intravenous Q12H   spironolactone  12.5 mg Oral Daily   Continuous Infusions:  sodium chloride      Data Reviewed: I have personally reviewed following labs and imaging studies  CBC: Recent Labs  Lab 02/07/19 1644  WBC 6.1  HGB 13.3  HCT 42.6  MCV 95.7  PLT 123XX123   Basic Metabolic Panel: Recent Labs  Lab 02/07/19 1644  NA 138  K 4.0  CL 101  CO2 31  GLUCOSE 160*  BUN 21  CREATININE 1.26*  CALCIUM 8.7*   GFR: Estimated Creatinine Clearance: 50.2 mL/min (A) (by C-G formula based on SCr of 1.26 mg/dL (H)). Liver Function Tests: No results for input(s): AST, ALT, ALKPHOS, BILITOT, PROT, ALBUMIN in the last 168 hours. No results for input(s): LIPASE, AMYLASE in the last 168 hours. No results for input(s): AMMONIA in the last 168 hours. Coagulation Profile: No results for input(s): INR, PROTIME in the last 168 hours. Cardiac Enzymes: No results for input(s): CKTOTAL, CKMB, CKMBINDEX, TROPONINI in the last 168 hours. BNP (last 3 results) No results for input(s): PROBNP in the last 8760 hours. HbA1C: No results for input(s): HGBA1C in the last 72 hours. CBG: Recent Labs  Lab 02/08/19 1151 02/08/19 1620 02/08/19 2030 02/08/19 2329 02/09/19 0436  GLUCAP 97 159* 200* 145* 120*   Lipid Profile:  No results for input(s): CHOL, HDL, LDLCALC, TRIG, CHOLHDL, LDLDIRECT in the last 72 hours. Thyroid Function Tests: No results for input(s): TSH, T4TOTAL, FREET4, T3FREE, THYROIDAB in the last 72 hours. Anemia Panel: No results for input(s): VITAMINB12, FOLATE, FERRITIN, TIBC, IRON, RETICCTPCT in the last 72 hours. Sepsis Labs: No results for input(s): PROCALCITON, LATICACIDVEN in the last 168 hours.  Recent Results (from the past 240 hour(s))  SARS CORONAVIRUS 2 (TAT 6-24 HRS) Nasopharyngeal Nasopharyngeal Swab     Status: None   Collection Time: 02/07/19  6:23 PM   Specimen: Nasopharyngeal Swab  Result Value Ref Range Status   SARS  Coronavirus 2 NEGATIVE NEGATIVE Final    Comment: (NOTE) SARS-CoV-2 target nucleic acids are NOT DETECTED. The SARS-CoV-2 RNA is generally detectable in upper and lower respiratory specimens during the acute phase of infection. Negative results do not preclude SARS-CoV-2 infection, do not rule out co-infections with other pathogens, and should not be used as the sole basis for treatment or other patient management decisions. Negative results must be combined with clinical observations, patient history, and epidemiological information. The expected result is Negative. Fact Sheet for Patients: SugarRoll.be Fact Sheet for Healthcare Providers: https://www.woods-mathews.com/ This test is not yet approved or cleared by the [place] FDA and  has been authorized for detection and/or diagnosis of SARS-CoV-2 by FDA under an Emergency Use Authorization (EUA). This EUA will remain  in effect (meaning this test can be used) for the duration of the COVID-19 declaration under Section 56 4(b)(1) of the Act, 21 U.S.C. section 360bbb-3(b)(1), unless the authorization is terminated or revoked sooner. Performed at [facility], [place] [address] [place], [place] [address]       Radiology Studies: Dg Chest 2 View  Result Date: 02/07/2019 CLINICAL DATA:  CHF EXAM: CHEST - 2 VIEW COMPARISON:  11/19/2016 FINDINGS: Cardiomegaly with left chest multi lead pacer defibrillator. Both lungs are clear. Disc degenerative disease of the thoracic spine. IMPRESSION: Cardiomegaly without acute abnormality of the lungs. No focal airspace opacity. Electronically Signed   By: Eddie Candle M.D.   On: 02/07/2019 17:00   Ct Angio Chest Pe W And/or Wo Contrast  Result Date: 02/07/2019 CLINICAL DATA:  PE suspected, high pretest probability, history of CHF EXAM: CT ANGIOGRAPHY CHEST WITH CONTRAST TECHNIQUE: Multidetector CT imaging of the chest was performed using the  standard protocol during bolus administration of intravenous contrast. Multiplanar CT image reconstructions and MIPs were obtained to evaluate the vascular anatomy. CONTRAST:  177mL OMNIPAQUE IOHEXOL 350 MG/ML SOLN COMPARISON:  Chest radiograph 02/07/2019, CT abdomen pelvis 01/21/2019 FINDINGS: Cardiovascular: Satisfactory opacification of the pulmonary arteries to the segmental level. No pulmonary arterial filling defects are identified. Borderline enlargement of the central pulmonary arteries. RV/LV ratio is normal (0.6) mild cardiomegaly with predominantly right atrial enlargement. Reflux of contrast into the hepatic veins. Extensive coronary artery calcifications are present. Cardiac pacer/defibrillator pack overlies the left chest wall. Defibrillation lead position at the cardiac apex. Additional leads at the right atrium and coronary sinus. Limited opacification of the aorta. Ascending thoracic aorta is upper limits of normal caliber. Atherosclerotic plaque throughout the aorta and proximal great vessels which are otherwise unremarkable. Mild distension of the azygous veins. Mediastinum/Nodes: Thyroid gland and thoracic inlet are unremarkable. No acute abnormality of the trachea or esophagus. No mediastinal, hilar axillary adenopathy. Lungs/Pleura: Bandlike areas of scarring are present in the lingula and lower lobes. No consolidation, features of edema, pneumothorax, or effusion. No suspicious pulmonary nodules or masses. Upper  Abdomen: Patient is post cholecystectomy. Small accessory splenules are noted. Left renal atrophy is present. Extensive vascular calcium noted in the upper abdomen. Musculoskeletal: Multilevel flowing anterior osteophytosis, compatible with features of diffuse idiopathic skeletal hyperostosis (DISH). There is irregular lucency through the inferior endplate T10 and superior endplate T11 concerning for acute fracture deformities at these of levels. There is osseous extension through the  posterior tension band with distracted fracture of the spinous process of T10 and lucency seen the posterior elements of T11. Question additional lucency through the tips of the spinous processes of T7 and T8 as well. There is stable superior endplate cupping at 624THL. No other acute or suspicious osseous lesions. Bones are diffusely demineralized. Review of the MIP images confirms the above findings. IMPRESSION: 1. No evidence of pulmonary embolism. Borderline enlargement of the central pulmonary arteries, which can be seen with pulmonary arterial hypertension. 2. Evidence of right-sided heart failure with right atrial enlargement and reflux of contrast into the hepatic veins. 3. Acute interval worsening of the known fracture deformities of the inferior endplate T10 and superior endplate T11 with now with transosseous extension through the posterior tension band with distracted fracture of the spinous process of T10 and lucency seen the posterior elements of T11. Further evaluation with MRI is recommended. 4. Question additional nondisplaced spinous process fractures of T7 and T8. 5. Stable superior endplate compression deformity at T12. 6. Aortic Atherosclerosis (ICD10-I70.0). These results were called by telephone at the time of interpretation on 02/07/2019 at 7:40 pm to provider Davonna Belling , who verbally acknowledged these results. Electronically Signed   By: Lovena Le M.D.   On: 02/07/2019 19:43   Ct Thoracic Spine Wo Contrast  Result Date: 02/08/2019 CLINICAL DATA:  78 year old female status post fall. Suspected T10-T11 fracture in the setting of ankylosis. EXAM: CT THORACIC SPINE WITHOUT CONTRAST TECHNIQUE: Multidetector CT images of the thoracic were obtained using the standard protocol without intravenous contrast. COMPARISON:  CTA chest 02/07/2019. FINDINGS: Limited cervical spine imaging: Bulky anterior C6-C7 osteophyte which might fully bridge the disc space. Cervicothoracic junction  alignment is within normal limits. Thoracic spine segmentation:  Normal. Alignment: Stable since yesterday. Minor chronic appearing retrolisthesis of T11 on T12 with relatively preserved thoracic kyphosis. Vertebrae: Osteopenia. Flowing endplate osteophytes in the thoracic spine from T3 into the upper lumbar spine resulting in ankylosis. Mild T1 superior endplate irregularity is probably chronic. The T1 through T9 vertebral bodies appear intact. Comminuted fracture through the T10 inferior endplate eccentric to the left (series 5, image 121) with mild endplate compression. The T10 pedicles appear to remain intact. No T10 lamina or facet fracture is identified although there is a fracture of the T10 spinous process on series 9, image 32 which corresponds to the level of the disc space. Associated comminuted horizontal fracture through the T11 superior endplate with mild compression. There appears to be a vertically oriented fracture across the junction of the right T11 pedicle and body on sagittal image 24. But otherwise the T11 pedicles and posterior elements appear intact. T12 superior endplate compression is probably chronic. There is vacuum phenomena associated with a T12-L1 anterior endplate osteophyte. The L1 level appears to be intact. No other acute thoracic vertebral fracture is identified; no definite acute thoracic spinous process fracture elsewhere. Posterior ribs appear to remain intact. Paraspinal and other soft tissues: Mild prevertebral and paraspinal soft tissue edema and/or hematoma at T10 and T11 (series 4, image 119). Trace layering pleural fluid. Otherwise stable from the chest CTA  yesterday. Calcified coronary artery atherosclerosis and/or stents. Calcified aortic atherosclerosis. Abdominal calcified arterial atherosclerosis. Intravenous contrast being excreted from both kidneys. Disc levels: Degenerative appearing spinal stenosis at T10-T11 which might be exacerbated by the superimposed acute  fracture. Posterior disc osteophyte complex plus mild to moderate facet hypertrophy. Trace vacuum facet. AP thecal sac estimated at 5-6 millimeters, mild to moderate spinal stenosis. Moderate to severe bilateral T10 foraminal stenosis which is greater on the left and appears to be at least in part related to the acute trauma. Thoracic spine degeneration elsewhere. Mild degenerative spinal stenosis suspected at T6-T7. IMPRESSION: 1. Acute fractures re-demonstrated at T10 and T11 in the setting of underlying ankylosis. Comminution of the T10 inferior and T11 superior endplates with fracture extension through the T10 spinous process. Fracture at the anterior junction of the right T11 pedicle and body, but otherwise no pedicle or lamina fracture identified. 2. Up to moderate multifactorial spinal stenosis and severe foraminal stenosis at T10-T11 which appears exacerbated by the acute trauma. 3. Superimposed age indeterminate superior endplate compression at T12 and T1. 4. Osteopenia.  Diffuse idiopathic skeletal hyperostosis (DISH). 5. Trace pleural fluid. Electronically Signed   By: Genevie Ann M.D.   On: 02/08/2019 10:23      LOS: 2 days   Time spent: More than 50% of that time was spent in counseling and/or coordination of care.  Antonieta Pert, MD [facility]  02/09/2019, 7:48 AM

## 2019-02-10 DIAGNOSIS — S22070A Wedge compression fracture of T9-T10 vertebra, initial encounter for closed fracture: Secondary | ICD-10-CM

## 2019-02-10 LAB — GLUCOSE, CAPILLARY
Glucose-Capillary: 154 mg/dL — ABNORMAL HIGH (ref 70–99)
Glucose-Capillary: 154 mg/dL — ABNORMAL HIGH (ref 70–99)
Glucose-Capillary: 164 mg/dL — ABNORMAL HIGH (ref 70–99)
Glucose-Capillary: 214 mg/dL — ABNORMAL HIGH (ref 70–99)
Glucose-Capillary: 99 mg/dL (ref 70–99)

## 2019-02-10 MED ORDER — FUROSEMIDE 40 MG PO TABS
40.0000 mg | ORAL_TABLET | Freq: Every day | ORAL | Status: DC
  Administered 2019-02-11 – 2019-02-21 (×10): 40 mg via ORAL
  Filled 2019-02-10 (×10): qty 1

## 2019-02-10 MED ORDER — CARVEDILOL 12.5 MG PO TABS
12.5000 mg | ORAL_TABLET | Freq: Two times a day (BID) | ORAL | Status: DC
  Administered 2019-02-10 – 2019-02-14 (×9): 12.5 mg via ORAL
  Filled 2019-02-10 (×9): qty 1

## 2019-02-10 MED ORDER — INSULIN ASPART 100 UNIT/ML ~~LOC~~ SOLN
0.0000 [IU] | Freq: Three times a day (TID) | SUBCUTANEOUS | Status: DC
  Administered 2019-02-10 (×2): 2 [IU] via SUBCUTANEOUS
  Administered 2019-02-11: 1 [IU] via SUBCUTANEOUS
  Administered 2019-02-11 – 2019-02-12 (×3): 2 [IU] via SUBCUTANEOUS
  Administered 2019-02-12: 1 [IU] via SUBCUTANEOUS
  Administered 2019-02-12 – 2019-02-13 (×4): 2 [IU] via SUBCUTANEOUS
  Administered 2019-02-14: 3 [IU] via SUBCUTANEOUS
  Administered 2019-02-14: 2 [IU] via SUBCUTANEOUS
  Administered 2019-02-14: 1 [IU] via SUBCUTANEOUS
  Administered 2019-02-15: 5 [IU] via SUBCUTANEOUS
  Administered 2019-02-15: 7 [IU] via SUBCUTANEOUS
  Administered 2019-02-16: 5 [IU] via SUBCUTANEOUS
  Administered 2019-02-16 – 2019-02-17 (×3): 7 [IU] via SUBCUTANEOUS
  Administered 2019-02-17: 5 [IU] via SUBCUTANEOUS
  Administered 2019-02-17: 7 [IU] via SUBCUTANEOUS
  Administered 2019-02-18: 5 [IU] via SUBCUTANEOUS
  Administered 2019-02-18: 3 [IU] via SUBCUTANEOUS
  Administered 2019-02-18: 5 [IU] via SUBCUTANEOUS
  Administered 2019-02-19: 3 [IU] via SUBCUTANEOUS
  Administered 2019-02-19 (×2): 2 [IU] via SUBCUTANEOUS
  Administered 2019-02-20 (×2): 3 [IU] via SUBCUTANEOUS
  Administered 2019-02-20: 5 [IU] via SUBCUTANEOUS
  Administered 2019-02-21: 2 [IU] via SUBCUTANEOUS
  Administered 2019-02-21: 3 [IU] via SUBCUTANEOUS

## 2019-02-10 NOTE — Progress Notes (Signed)
PT Cancellation Note  Patient Details Name: Victoria Adams MRN: [id number] DOB: January 09, 1941   Cancelled Treatment:    Reason Eval/Treat Not Completed: Medical issues which prohibited therapy(Await advisement from Dr. Saintclair Halsted regarding X-ray results. )   Denice Paradise 02/10/2019, 8:34 AM  Song Myre W,PT Acute Rehabilitation Services Pager:  [phone]  Office:  [phone]

## 2019-02-10 NOTE — Progress Notes (Signed)
Patient ID: Victoria Adams, female   DOB: Nov 19, 1940, 78 y.o.   MRN: [id number] BP (!) 117/59 (BP Location: Right Arm)   Pulse 79   Temp 98.7 F (37.1 C) (Oral)   Resp 20   Ht 5\' 7"  (1.702 m)   Wt 117.7 kg   SpO2 91%   BMI 40.63 kg/m  Alert and oriented x 4, speech is clear and fluent Moving all extremities well Xray spine showed gross instability and worsening of the compression.  Will need surgery for stabilization Needs to be strict bedrest.

## 2019-02-10 NOTE — Progress Notes (Signed)
PT Cancellation Note  Patient Details Name: [name] MRN: [id number] DOB: 1940/10/04   Cancelled Treatment:    Reason Eval/Treat Not Completed: Medical issues which prohibited therapy(Pt placed on strict bedrest and plan for surgery.) Will sign off.  Please reorder PT when appropriate.  Thanks.   Denice Paradise 02/10/2019, 9:53 AM Aldric Wenzler W,PT Acute Rehabilitation Services Pager:  [phone]  Office:  [phone]

## 2019-02-10 NOTE — Progress Notes (Signed)
PROGRESS NOTE    Victoria Adams  [id number] DOB: Aug 11, 1940 DOA: 02/07/2019 PCP: Caryl Bis, MD   Brief Narrative: 78 year old female with multiple medical comorbidities presented with increased back pain, imaging showed worsening of compression fracture in the thoracic spine concerning for unstable fracture and was transferred to [facility] for neurosurgery evaluation.Followed by neurosurgery  Subjective: C/o  Back pain. Has been up. Denies bowel bladder incontinence  "I don't believe I can do Mri" Lives with 2 kids at home, has WC and WK at home, denies smoking,etoh  Assessment & Plan:  T10-T11Thoracic compression fracture progressed compared to 2 weeks ago with associated severe back pain: Followed by neurosurgery advised MRI-has a BiV pacer and was programmed for MRI 10/30- could not tolerate due to claustrophobia,?drop in spo2. On RA currently. Seen by Neurosurgery-TSLO ordered- discussed this am w Neurosurgery Dr Arneta Cliche- advised strict bed rest- and likely need surgical stabilization. Patient  and RN advised.  Bilateral lower extremity edema/Hx of systolic chf: Last echo in 2018 with normal LVEF and normal RV function- seems to have LV recovery with CRT D. Currently no shortness of breath, EKG is paced rhythm troponin negative CT angio negative for PE. Cont home carvedilol, entresto, aldactone. Lasix was on  hold given borderline low blood pressure 10/29. Echocardiogram 10/29 -Left ventricular ejection fraction, by visual estimation, is 60 to 65%. Global right ventricle has normal systolic function.  Monitor daily weight and resume Lasix at lower dose- 40 mg daily.  Essential hypertension, benign: Blood soft at times in 90s midnight. Cut down coreg to 12.5 mg and monitor bp.    Paroxysmal atrial fibrillation: Rate is stable, continue Coreg.  Eliquis on hold until final surgical recommendation.  Type 2 diabetes mellitus with complication: Sugar fairly stable Lantus on hold  continue sliding scale insulin.  Morbid obesity with BMI 40.6, healthy lifestyle weight loss will beneficial.  Body mass index is 40.63 kg/m.   DVT prophylaxis:Heparin Code Status: full Family Communication: plan of care discussed with patient at bedside. Disposition Plan: Remains inpatient pending clinical improvement and further neurosurgical intervettion. Discussed w neurosurgery  Consultants: Neurosurgery  Procedures:  10/28 CT chest no PE.  Right-sided heart failure with right atrial enlargement.  No worsening of known fracture deformities T10 and T11.  Additional nondisplaced spinous process fractures T7 and T8 questioned.    10/29 CT thoracic spine: Acute fractures T10 and T11 with fracture extension through T10 spinous process  10/29 echocardiogram:  1. Left ventricular ejection fraction, by visual estimation, is 60 to 65%. The left ventricle has normal function. There is mildly increased left ventricular hypertrophy.  2. Definity contrast agent was given IV to delineate the left ventricular endocardial borders.  3. Abnormal septal motion consistent with RV pacemaker.5. Global right ventricle has normal systolic function.The right ventricular size is normal. No increase in right ventricular wall thickness.  Microbiology:none  Antimicrobials: Anti-infectives (From admission, onward)   None       Objective: Vitals:   02/10/19 0026 02/10/19 0500 02/10/19 0525 02/10/19 0825  BP: (!) 122/45  (!) 145/62 (!) 117/59  Pulse: 78  80 79  Resp:   17 20  Temp:   98.5 F (36.9 C) 98.7 F (37.1 C)  TempSrc:   Oral Oral  SpO2:   96% 91%  Weight:  117.7 kg    Height:        Intake/Output Summary (Last 24 hours) at 02/10/2019 0951 Last data filed at 02/10/2019 0840 Gross per 24 hour  Intake 1314 ml  Output -  Net 1314 ml   Filed Weights   02/08/19 0309 02/09/19 0450 02/10/19 0500  Weight: 116.2 kg 116.9 kg 117.7 kg   Weight change: 0.771 kg  Body mass index is 40.63  kg/m.  Intake/Output from previous day: 10/30 0701 - 10/31 0700 In: 1074 [P.O.:1074] Out: -  Intake/Output this shift: Total I/O In: 240 [P.O.:240] Out: -   Examination: General exam: AAO,NAD, obese, on RA. HEENT:Oral mucosa moist, Ear/Nose WNL grossly, dentition normal. Respiratory system: Diminished at the base,no wheezing or crackles,no use of accessory muscle Cardiovascular system: S1 & S2 +, No JVD,. Gastrointestinal system: Abdomen soft, NT,ND, BS+ Nervous System:Alert, awake, moving extremities and grossly nonfocal Extremities: b/l ankle edema + mild,distal peripheral pulses palpable.  Skin: No rashes,no icterus. MSK: Normal muscle bulk,tone, power  Medications:  Scheduled Meds: . carvedilol  25 mg Oral BID WC  . colchicine  0.6 mg Oral Daily  . docusate sodium  100 mg Oral BID  . ferrous sulfate  325 mg Oral Q breakfast  . heparin injection (subcutaneous)  5,000 Units Subcutaneous Q8H  . insulin aspart  0-9 Units Subcutaneous Q4H  . pantoprazole  40 mg Oral Daily  . polyethylene glycol  17 g Oral Daily  . potassium chloride SA  20 mEq Oral Daily  . rosuvastatin  20 mg Oral Daily  . sacubitril-valsartan  1 tablet Oral BID  . sertraline  100 mg Oral Daily  . sodium chloride flush  3 mL Intravenous Q12H  . spironolactone  12.5 mg Oral Daily   Continuous Infusions: . sodium chloride      Data Reviewed: I have personally reviewed following labs and imaging studies  CBC: Recent Labs  Lab 02/07/19 1644  WBC 6.1  HGB 13.3  HCT 42.6  MCV 95.7  PLT 123XX123   Basic Metabolic Panel: Recent Labs  Lab 02/07/19 1644  NA 138  K 4.0  CL 101  CO2 31  GLUCOSE 160*  BUN 21  CREATININE 1.26*  CALCIUM 8.7*   GFR: Estimated Creatinine Clearance: 50.4 mL/min (A) (by C-G formula based on SCr of 1.26 mg/dL (H)). Liver Function Tests: No results for input(s): AST, ALT, ALKPHOS, BILITOT, PROT, ALBUMIN in the last 168 hours. No results for input(s): LIPASE, AMYLASE in  the last 168 hours. No results for input(s): AMMONIA in the last 168 hours. Coagulation Profile: No results for input(s): INR, PROTIME in the last 168 hours. Cardiac Enzymes: No results for input(s): CKTOTAL, CKMB, CKMBINDEX, TROPONINI in the last 168 hours. BNP (last 3 results) No results for input(s): PROBNP in the last 8760 hours. HbA1C: No results for input(s): HGBA1C in the last 72 hours. CBG: Recent Labs  Lab 02/09/19 1136 02/09/19 1618 02/09/19 2006 02/10/19 0029 02/10/19 0448  GLUCAP 95 166* 179* 154* 99   Lipid Profile: No results for input(s): CHOL, HDL, LDLCALC, TRIG, CHOLHDL, LDLDIRECT in the last 72 hours. Thyroid Function Tests: No results for input(s): TSH, T4TOTAL, FREET4, T3FREE, THYROIDAB in the last 72 hours. Anemia Panel: No results for input(s): VITAMINB12, FOLATE, FERRITIN, TIBC, IRON, RETICCTPCT in the last 72 hours. Sepsis Labs: No results for input(s): PROCALCITON, LATICACIDVEN in the last 168 hours.  Recent Results (from the past 240 hour(s))  SARS CORONAVIRUS 2 (TAT 6-24 HRS) Nasopharyngeal Nasopharyngeal Swab     Status: None   Collection Time: 02/07/19  6:23 PM   Specimen: Nasopharyngeal Swab  Result Value Ref Range Status   SARS Coronavirus 2 NEGATIVE NEGATIVE  Final    Comment: (NOTE) SARS-CoV-2 target nucleic acids are NOT DETECTED. The SARS-CoV-2 RNA is generally detectable in upper and lower respiratory specimens during the acute phase of infection. Negative results do not preclude SARS-CoV-2 infection, do not rule out co-infections with other pathogens, and should not be used as the sole basis for treatment or other patient management decisions. Negative results must be combined with clinical observations, patient history, and epidemiological information. The expected result is Negative. Fact Sheet for Patients: SugarRoll.be Fact Sheet for Healthcare Providers: https://www.woods-mathews.com/  This test is not yet approved or cleared by the [place] FDA and  has been authorized for detection and/or diagnosis of SARS-CoV-2 by FDA under an Emergency Use Authorization (EUA). This EUA will remain  in effect (meaning this test can be used) for the duration of the COVID-19 declaration under Section 56 4(b)(1) of the Act, 21 U.S.C. section 360bbb-3(b)(1), unless the authorization is terminated or revoked sooner. Performed at [facility], [place] [address], [place], [place] [address]       Radiology Studies: Dg Thoracic Spine 1vclearing  Result Date: 02/09/2019 CLINICAL DATA:  Thoracic compression fracture. EXAM: Standing AP and lateral views of the thoracic spine. COMPARISON:  CT scan of the thoracic spine dated 02/08/2019 FINDINGS: In the standing position there is marked increase of the compression of the superior aspect of T11 as compared to the CT scan of 02/08/2019. There is slight increased compression of the anterior inferior aspect of T11 with standing. The compression of T12 appears unchanged. The thoracic kyphosis is accentuated in the upright position. IMPRESSION: 1. Increased compression of the superior aspect of T11 in the upright position as compared to the CT scan of 02/08/2019. 2. Slight increased compression of the anterior inferior aspect of T11 with standing. 3. Stable compression of T12. Electronically Signed   By: Lorriane Shire M.D.   On: 02/09/2019 11:15      LOS: 3 days   Time spent: More than 50% of that time was spent in counseling and/or coordination of care.  Antonieta Pert, MD [facility]  02/10/2019, 9:51 AM

## 2019-02-11 LAB — CBC
HCT: 38.5 % (ref 36.0–46.0)
Hemoglobin: 12.5 g/dL (ref 12.0–15.0)
MCH: 30.3 pg (ref 26.0–34.0)
MCHC: 32.5 g/dL (ref 30.0–36.0)
MCV: 93.4 fL (ref 80.0–100.0)
Platelets: 138 10*3/uL — ABNORMAL LOW (ref 150–400)
RBC: 4.12 MIL/uL (ref 3.87–5.11)
RDW: 13.7 % (ref 11.5–15.5)
WBC: 4.6 10*3/uL (ref 4.0–10.5)
nRBC: 0 % (ref 0.0–0.2)

## 2019-02-11 LAB — BASIC METABOLIC PANEL
Anion gap: 11 (ref 5–15)
BUN: 27 mg/dL — ABNORMAL HIGH (ref 8–23)
CO2: 25 mmol/L (ref 22–32)
Calcium: 8.7 mg/dL — ABNORMAL LOW (ref 8.9–10.3)
Chloride: 100 mmol/L (ref 98–111)
Creatinine, Ser: 1.41 mg/dL — ABNORMAL HIGH (ref 0.44–1.00)
GFR calc Af Amer: 42 mL/min — ABNORMAL LOW (ref >60)
GFR calc non Af Amer: 36 mL/min — ABNORMAL LOW (ref >60)
Glucose, Bld: 149 mg/dL — ABNORMAL HIGH (ref 70–99)
Potassium: 4.1 mmol/L (ref 3.5–5.1)
Sodium: 136 mmol/L (ref 135–145)

## 2019-02-11 LAB — GLUCOSE, CAPILLARY
Glucose-Capillary: 162 mg/dL — ABNORMAL HIGH (ref 70–99)
Glucose-Capillary: 138 mg/dL — ABNORMAL HIGH (ref 70–99)
Glucose-Capillary: 162 mg/dL — ABNORMAL HIGH (ref 70–99)
Glucose-Capillary: 171 mg/dL — ABNORMAL HIGH (ref 70–99)
Glucose-Capillary: 204 mg/dL — ABNORMAL HIGH (ref 70–99)

## 2019-02-11 MED ORDER — SIMETHICONE 80 MG PO CHEW
80.0000 mg | CHEWABLE_TABLET | Freq: Four times a day (QID) | ORAL | Status: DC | PRN
  Administered 2019-02-11 – 2019-02-19 (×6): 80 mg via ORAL
  Filled 2019-02-11 (×7): qty 1

## 2019-02-11 MED ORDER — HYDROMORPHONE HCL 2 MG PO TABS
2.0000 mg | ORAL_TABLET | Freq: Once | ORAL | Status: AC
  Administered 2019-02-11: 2 mg via ORAL
  Filled 2019-02-11: qty 1

## 2019-02-11 NOTE — Plan of Care (Signed)

## 2019-02-11 NOTE — Progress Notes (Signed)
PROGRESS NOTE    Victoria Adams  [id number] DOB: [birth year] DOA: 02/07/2019 PCP: Caryl Bis, MD   Brief Narrative:  Patient is a 78 year old female with history of hypertension, diabetes, systolic congestive heart failure, proximal A. Fib who presents with fall about 2 weeks ago, increased back pain since then.  Imaging on presentation showed worsening of compression fracture of thoracic spine concerning for unstable fracture.  Neurosurgery consulted.  Neurosurgery planning for surgery for stabilization.  Currently on a strict bedrest.  Assessment & Plan:   Principal Problem:   Thoracic compression fracture [facility]) Active Problems:   Essential hypertension, benign   Atrial fibrillation (HCC)   Type 2 diabetes mellitus with complication (HCC)   Dyspnea   Back pain   T10-T11 thoracic compression fracture: Progression as compared to 2 weeks ago associated with severe back pain.  Neurosurgery following.  Advised MRI but patient has biventricular pacemaker.  Patient also could not tolerate MRI due to claustrophobia.  Was on TSLO which has been discontinued.  Neurosurgery planning for surgery for stabilization.  Currently pain is well controlled.  Bilateral lower extremity edema/history of systolic CHF: Last echo in clinic in with normal left ventricular dysfunction and normal right ventricular function, she might have left ventricular recovery with CRT device.  Denies any shortness of breath.  Has mild lower extremity bilateral edema.  She is on Lasix 80 mg twice a day at home, on spironolactone, on discharge.  She was hypotensive on presentation so Lasix was held.  Echocardiogram done on 10/29 showed ejection fraction of 60-65%.  Lasix 40 mg daily resumed.  She may not need Entresto or Aldactone because her ejection fraction is recovered.  She needs to follow-up with cardiology as an outpatient  Hypertension: Continue current medicines.  Continue to monitor BP.  Currently BP stable   Paroxysmal A. fib: Rate is controlled.  Continue Coreg.  Eliquis on hold for surgical planning  Diabetes mellitus: Continue sliding's insulin.  CKD stage III: Currently kidney function is at baseline  Morbid obesity: BMI of 40         DVT prophylaxis: Heparin [place] Code Status:  Full Family Communication: Family member present at the bedside Disposition Plan: Undetermined.  Awaiting neurosurgical intervention.  Currently on a strict bedrest   Consultants: Neurosurgery  Procedures: None  Antimicrobials:  Anti-infectives (From admission, onward)   None      Subjective: Patient seen and examined the bedside this afternoon.  Currently hemodynamically stable.  Pain is better.  Complains of abdominal bloating with gas.  Objective: Vitals:   02/10/19 1946 02/11/19 0240 02/11/19 0500 02/11/19 0750  BP: (!) 103/51 (!) 135/54  (!) 131/56  Pulse: 78 68  80  Resp: 16 18    Temp: 97.8 F (36.6 C) 98.3 F (36.8 C)  98.6 F (37 C)  TempSrc: Oral Oral  Oral  SpO2: 94% 95%  93%  Weight:   115.9 kg   Height:        Intake/Output Summary (Last 24 hours) at 02/11/2019 1425 Last data filed at 02/11/2019 1300 Gross per 24 hour  Intake 720 ml  Output 1500 ml  Net -780 ml   Filed Weights   02/09/19 0450 02/10/19 0500 02/11/19 0500  Weight: 116.9 kg 117.7 kg 115.9 kg    Examination:  General exam: Not in distress,morbidly obese HEENT:PERRL,Oral mucosa moist, Ear/Nose normal on gross exam Respiratory system: Bilateral equal air entry, normal vesicular breath sounds, no wheezes or crackles  Cardiovascular system: S1 &  S2 heard, RRR. No JVD, murmurs, rubs, gallops or clicks. Trace pedal edema. Gastrointestinal system: Abdomen is mildly distended, soft and nontender. No organomegaly or masses felt. Normal bowel sounds heard. Central nervous system: Alert and oriented. No focal neurological deficits. Extremities: Trace bilateral lower extremity edema, no clubbing ,no cyanosis,  distal peripheral pulses palpable. Skin: No rashes, lesions or ulcers,no icterus ,no pallor    Data Reviewed: I have personally reviewed following labs and imaging studies  CBC: Recent Labs  Lab 02/07/19 1644 02/11/19 0415  WBC 6.1 4.6  HGB 13.3 12.5  HCT 42.6 38.5  MCV 95.7 93.4  PLT 199 0000000*   Basic Metabolic Panel: Recent Labs  Lab 02/07/19 1644 02/11/19 0415  NA 138 136  K 4.0 4.1  CL 101 100  CO2 31 25  GLUCOSE 160* 149*  BUN 21 27*  CREATININE 1.26* 1.41*  CALCIUM 8.7* 8.7*   GFR: Estimated Creatinine Clearance: 44.6 mL/min (A) (by C-G formula based on SCr of 1.41 mg/dL (H)). Liver Function Tests: No results for input(s): AST, ALT, ALKPHOS, BILITOT, PROT, ALBUMIN in the last 168 hours. No results for input(s): LIPASE, AMYLASE in the last 168 hours. No results for input(s): AMMONIA in the last 168 hours. Coagulation Profile: No results for input(s): INR, PROTIME in the last 168 hours. Cardiac Enzymes: No results for input(s): CKTOTAL, CKMB, CKMBINDEX, TROPONINI in the last 168 hours. BNP (last 3 results) No results for input(s): PROBNP in the last 8760 hours. HbA1C: No results for input(s): HGBA1C in the last 72 hours. CBG: Recent Labs  Lab 02/10/19 1126 02/10/19 1648 02/10/19 2121 02/11/19 0654 02/11/19 1154  GLUCAP 154* 164* 214* 138* 162*   Lipid Profile: No results for input(s): CHOL, HDL, LDLCALC, TRIG, CHOLHDL, LDLDIRECT in the last 72 hours. Thyroid Function Tests: No results for input(s): TSH, T4TOTAL, FREET4, T3FREE, THYROIDAB in the last 72 hours. Anemia Panel: No results for input(s): VITAMINB12, FOLATE, FERRITIN, TIBC, IRON, RETICCTPCT in the last 72 hours. Sepsis Labs: No results for input(s): PROCALCITON, LATICACIDVEN in the last 168 hours.  Recent Results (from the past 240 hour(s))  SARS CORONAVIRUS 2 (TAT 6-24 HRS) Nasopharyngeal Nasopharyngeal Swab     Status: None   Collection Time: 02/07/19  6:23 PM   Specimen:  Nasopharyngeal Swab  Result Value Ref Range Status   SARS Coronavirus 2 NEGATIVE NEGATIVE Final    Comment: (NOTE) SARS-CoV-2 target nucleic acids are NOT DETECTED. The SARS-CoV-2 RNA is generally detectable in upper and lower respiratory specimens during the acute phase of infection. Negative results do not preclude SARS-CoV-2 infection, do not rule out co-infections with other pathogens, and should not be used as the sole basis for treatment or other patient management decisions. Negative results must be combined with clinical observations, patient history, and epidemiological information. The expected result is Negative. Fact Sheet for Patients: SugarRoll.be Fact Sheet for Healthcare Providers: https://www.woods-mathews.com/ This test is not yet approved or cleared by the [place] FDA and  has been authorized for detection and/or diagnosis of SARS-CoV-2 by FDA under an Emergency Use Authorization (EUA). This EUA will remain  in effect (meaning this test can be used) for the duration of the COVID-19 declaration under Section 56 4(b)(1) of the Act, 21 U.S.C. section 360bbb-3(b)(1), unless the authorization is terminated or revoked sooner. Performed at [facility], [place] [address], [place], [place] [address]          Radiology Studies: No results found.      Scheduled Meds: .  carvedilol  12.5 mg Oral BID WC  . colchicine  0.6 mg Oral Daily  . docusate sodium  100 mg Oral BID  . ferrous sulfate  325 mg Oral Q breakfast  . furosemide  40 mg Oral Daily  . heparin injection (subcutaneous)  5,000 Units Subcutaneous Q8H  . insulin aspart  0-9 Units Subcutaneous TID WC  . pantoprazole  40 mg Oral Daily  . polyethylene glycol  17 g Oral Daily  . potassium chloride SA  20 mEq Oral Daily  . rosuvastatin  20 mg Oral Daily  . sacubitril-valsartan  1 tablet Oral BID  . sertraline  100 mg Oral Daily  . sodium chloride flush   3 mL Intravenous Q12H  . spironolactone  12.5 mg Oral Daily   Continuous Infusions: . sodium chloride       LOS: 4 days    Time spent: 25 mins.More than 50% of that time was spent in counseling and/or coordination of care.      Shelly Coss, MD [facility] Pager [phone]  If 7PM-7AM, please contact night-coverage [email or web address] Password [email or web address] 02/11/2019, 2:25 PM

## 2019-02-11 NOTE — Progress Notes (Signed)
NEUROSURGERY PROGRESS NOTE  Complains of a lot of back pain. States she was unable to tolerate a brace.  Temp:  [97.8 F (36.6 C)-98.7 F (37.1 C)] 98.6 F (37 C) (11/01 0750) Pulse Rate:  [68-81] 80 (11/01 0750) Resp:  [16-20] 18 (11/01 0240) BP: (103-135)/(48-85) 131/56 (11/01 0750) SpO2:  [91 %-96 %] 93 % (11/01 0750) Weight:  [115.9 kg] 115.9 kg (11/01 0500)  Plan: Continue strict bedrest and pain management. Dr. Annette Stable will see her tomorrow to determine plan of care.   Victoria Chiquito, NP 02/11/2019 7:53 AM

## 2019-02-11 NOTE — Plan of Care (Signed)

## 2019-02-12 LAB — GLUCOSE, CAPILLARY
Glucose-Capillary: 142 mg/dL — ABNORMAL HIGH (ref 70–99)
Glucose-Capillary: 159 mg/dL — ABNORMAL HIGH (ref 70–99)
Glucose-Capillary: 168 mg/dL — ABNORMAL HIGH (ref 70–99)
Glucose-Capillary: 237 mg/dL — ABNORMAL HIGH (ref 70–99)

## 2019-02-12 LAB — BASIC METABOLIC PANEL
Anion gap: 9 (ref 5–15)
BUN: 22 mg/dL (ref 8–23)
CO2: 27 mmol/L (ref 22–32)
Calcium: 9 mg/dL (ref 8.9–10.3)
Chloride: 103 mmol/L (ref 98–111)
Creatinine, Ser: 1.32 mg/dL — ABNORMAL HIGH (ref 0.44–1.00)
GFR calc Af Amer: 45 mL/min — ABNORMAL LOW (ref >60)
GFR calc non Af Amer: 39 mL/min — ABNORMAL LOW (ref >60)
Glucose, Bld: 138 mg/dL — ABNORMAL HIGH (ref 70–99)
Potassium: 4.3 mmol/L (ref 3.5–5.1)
Sodium: 139 mmol/L (ref 135–145)

## 2019-02-12 NOTE — Progress Notes (Signed)
PROGRESS NOTE    Victoria Adams  [id number] DOB: 1940-05-08 DOA: 02/07/2019 PCP: Caryl Bis, MD   Brief Narrative:  Patient is a 78 year old female with history of hypertension, diabetes, systolic congestive heart failure, proximal A. Fib who presents with fall about 2 weeks ago, increased back pain since then.  Imaging on presentation showed worsening of compression fracture of thoracic spine concerning for unstable fracture.  Neurosurgery consulted.  Neurosurgery planning for surgery for stabilization.  Currently on a strict bedrest.  Assessment & Plan:   Principal Problem:   Thoracic compression fracture [facility]) Active Problems:   Essential hypertension, benign   Atrial fibrillation (HCC)   Type 2 diabetes mellitus with complication (HCC)   Dyspnea   Back pain   T10-T11 thoracic compression fracture: Progression as compared to 2 weeks ago associated with severe back pain.  Neurosurgery following.  Advised MRI but patient has biventricular pacemaker.  Patient also could not tolerate MRI due to claustrophobia.  Was on TSLO which has been discontinued.  Neurosurgery planning for surgery for stabilization.  Continues to complain of back pain  Bilateral lower extremity edema/history of systolic CHF: Last echo in clinic in with normal left ventricular dysfunction and normal right ventricular function, she might have left ventricular recovery with CRT device.  Denies any shortness of breath.  Has mild lower extremity bilateral edema.  She is on Lasix 80 mg twice a day at home, on spironolactone, on discharge.  She was hypotensive on presentation so Lasix was held.  Echocardiogram done on 10/29 showed ejection fraction of 60-65%.  Lasix 40 mg daily resumed.  She may not need Entresto or Aldactone because her ejection fraction is recovered.  She needs to follow-up with cardiology as an outpatient  Hypertension: Continue current medicines.  Continue to monitor BP.  Currently BP stable   Paroxysmal A. fib: Rate is controlled.  Continue Coreg.  Eliquis on hold for surgical planning  Diabetes mellitus: Continue sliding scale  insulin.  CKD stage III: Currently kidney function is at baseline  Morbid obesity: BMI of 40         DVT prophylaxis: Heparin [place] Code Status:  Full Family Communication: Family member present at the bedside Disposition Plan: Undetermined.  Awaiting neurosurgical intervention.  Currently on a strict bedrest   Consultants: Neurosurgery  Procedures: None  Antimicrobials:  Anti-infectives (From admission, onward)   None      Subjective: Patient seen and examined the bedside this morning.  Hemodynamically stable.  Currently on complete bedrest.  Pain is okay during my evaluation but she continues to need frequent pain medicines to suppress it.  Objective: Vitals:   02/11/19 1427 02/11/19 1954 02/12/19 0324 02/12/19 0500  BP: (!) 142/73 (!) 147/63 124/65   Pulse: 70 72 74   Resp:  18 16   Temp: 98.5 F (36.9 C) 99.1 F (37.3 C) 98.5 F (36.9 C)   TempSrc: Oral Oral Oral   SpO2: 96% 96% 95%   Weight:    115.4 kg  Height:        Intake/Output Summary (Last 24 hours) at 02/12/2019 1503 Last data filed at 02/12/2019 0300 Gross per 24 hour  Intake -  Output 2600 ml  Net -2600 ml   Filed Weights   02/10/19 0500 02/11/19 0500 02/12/19 0500  Weight: 117.7 kg 115.9 kg 115.4 kg    Examination:  General exam: Not in distress,morbidly obese HEENT:PERRL,Oral mucosa moist, Ear/Nose normal on gross exam Respiratory system: Bilateral equal air entry,  normal vesicular breath sounds, no wheezes or crackles  Cardiovascular system: S1 & S2 heard, RRR. No JVD, murmurs, rubs, gallops or clicks. Trace pedal edema. Gastrointestinal system: Abdomen is mildly distended, soft and nontender. No organomegaly or masses felt. Normal bowel sounds heard. Central nervous system: Alert and oriented. No focal neurological deficits. Extremities: Trace  bilateral lower extremity edema, no clubbing ,no cyanosis, distal peripheral pulses palpable. Skin: No rashes, lesions or ulcers,no icterus ,no pallor    Data Reviewed: I have personally reviewed following labs and imaging studies  CBC: Recent Labs  Lab 02/07/19 1644 02/11/19 0415  WBC 6.1 4.6  HGB 13.3 12.5  HCT 42.6 38.5  MCV 95.7 93.4  PLT 199 0000000*   Basic Metabolic Panel: Recent Labs  Lab 02/07/19 1644 02/11/19 0415 02/12/19 0502  NA 138 136 139  K 4.0 4.1 4.3  CL 101 100 103  CO2 31 25 27   GLUCOSE 160* 149* 138*  BUN 21 27* 22  CREATININE 1.26* 1.41* 1.32*  CALCIUM 8.7* 8.7* 9.0   GFR: Estimated Creatinine Clearance: 47.6 mL/min (A) (by C-G formula based on SCr of 1.32 mg/dL (H)). Liver Function Tests: No results for input(s): AST, ALT, ALKPHOS, BILITOT, PROT, ALBUMIN in the last 168 hours. No results for input(s): LIPASE, AMYLASE in the last 168 hours. No results for input(s): AMMONIA in the last 168 hours. Coagulation Profile: No results for input(s): INR, PROTIME in the last 168 hours. Cardiac Enzymes: No results for input(s): CKTOTAL, CKMB, CKMBINDEX, TROPONINI in the last 168 hours. BNP (last 3 results) No results for input(s): PROBNP in the last 8760 hours. HbA1C: No results for input(s): HGBA1C in the last 72 hours. CBG: Recent Labs  Lab 02/11/19 1154 02/11/19 1542 02/11/19 2059 02/12/19 0643 02/12/19 1152  GLUCAP 162* 171* 204* 142* 159*   Lipid Profile: No results for input(s): CHOL, HDL, LDLCALC, TRIG, CHOLHDL, LDLDIRECT in the last 72 hours. Thyroid Function Tests: No results for input(s): TSH, T4TOTAL, FREET4, T3FREE, THYROIDAB in the last 72 hours. Anemia Panel: No results for input(s): VITAMINB12, FOLATE, FERRITIN, TIBC, IRON, RETICCTPCT in the last 72 hours. Sepsis Labs: No results for input(s): PROCALCITON, LATICACIDVEN in the last 168 hours.  Recent Results (from the past 240 hour(s))  SARS CORONAVIRUS 2 (TAT 6-24 HRS)  Nasopharyngeal Nasopharyngeal Swab     Status: None   Collection Time: 02/07/19  6:23 PM   Specimen: Nasopharyngeal Swab  Result Value Ref Range Status   SARS Coronavirus 2 NEGATIVE NEGATIVE Final    Comment: (NOTE) SARS-CoV-2 target nucleic acids are NOT DETECTED. The SARS-CoV-2 RNA is generally detectable in upper and lower respiratory specimens during the acute phase of infection. Negative results do not preclude SARS-CoV-2 infection, do not rule out co-infections with other pathogens, and should not be used as the sole basis for treatment or other patient management decisions. Negative results must be combined with clinical observations, patient history, and epidemiological information. The expected result is Negative. Fact Sheet for Patients: SugarRoll.be Fact Sheet for Healthcare Providers: https://www.woods-mathews.com/ This test is not yet approved or cleared by the [place] FDA and  has been authorized for detection and/or diagnosis of SARS-CoV-2 by FDA under an Emergency Use Authorization (EUA). This EUA will remain  in effect (meaning this test can be used) for the duration of the COVID-19 declaration under Section 56 4(b)(1) of the Act, 21 U.S.C. section 360bbb-3(b)(1), unless the authorization is terminated or revoked sooner. Performed at [facility], [place],  [place] [address]          Radiology Studies: No results found.      Scheduled Meds: . carvedilol  12.5 mg Oral BID WC  . colchicine  0.6 mg Oral Daily  . docusate sodium  100 mg Oral BID  . ferrous sulfate  325 mg Oral Q breakfast  . furosemide  40 mg Oral Daily  . heparin injection (subcutaneous)  5,000 Units Subcutaneous Q8H  . insulin aspart  0-9 Units Subcutaneous TID WC  . pantoprazole  40 mg Oral Daily  . polyethylene glycol  17 g Oral Daily  . potassium chloride SA  20 mEq Oral Daily  . rosuvastatin  20 mg Oral Daily   . sacubitril-valsartan  1 tablet Oral BID  . sertraline  100 mg Oral Daily  . sodium chloride flush  3 mL Intravenous Q12H  . spironolactone  12.5 mg Oral Daily   Continuous Infusions: . sodium chloride       LOS: 5 days    Time spent: 25 mins.More than 50% of that time was spent in counseling and/or coordination of care.      Shelly Coss, MD [facility] Pager [phone]  If 7PM-7AM, please contact night-coverage [email or web address] Password [email or web address] 02/12/2019, 3:03 PM

## 2019-02-12 NOTE — Progress Notes (Signed)
Patient has a very difficult clinical situation.  She has an atypical chance type fracture at the T10/T11 level.  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  Her symptoms prior primarily back pain with little if any radicular pain and no significant symptoms of myelopathy.  Her overall medical condition is poor with history of a pacemaker placement and chronic congestive heart failure.  She also has significant kidney disease and diabetes mellitus and also is morbidly obese.  Lastly she has severe osteoporosis which further complicates options.  There is no possibility of treatment with percutaneous vertebral augmentation alone (kyphoplasty).  Ideally the patient would be treated with a long segment thoracic lumbar pedicle screw construct however her overall medical condition and her pedicle anatomy make this impractical.  The only option I see that may be viable in her case would be to perform percutaneous pedicle screw fixation from T10-T12 with methylmethacrylate screw augmentation.  This would still require general anesthesia.  Operative time would be approximately 60 minutes.  Bottle should be minimal.  I would like her to be seen by our anesthesia department tomorrow to assess her perioperative risk and see if there is any other interventions that need to be performed prior to surgery.  Tentatively planning for surgery Thursday or Friday.

## 2019-02-13 ENCOUNTER — Other Ambulatory Visit: Payer: Self-pay | Admitting: Neurosurgery

## 2019-02-13 ENCOUNTER — Encounter (HOSPITAL_COMMUNITY): Payer: Self-pay | Admitting: Anesthesiology

## 2019-02-13 LAB — GLUCOSE, CAPILLARY
Glucose-Capillary: 155 mg/dL — ABNORMAL HIGH (ref 70–99)
Glucose-Capillary: 186 mg/dL — ABNORMAL HIGH (ref 70–99)
Glucose-Capillary: 189 mg/dL — ABNORMAL HIGH (ref 70–99)
Glucose-Capillary: 155 mg/dL — ABNORMAL HIGH (ref 70–99)

## 2019-02-13 NOTE — Progress Notes (Addendum)
The patient has been able to discuss her situation with her anesthesia department.  They I discussed the risks involved with general anesthesia.  The patient's mind is very much at ease.  She continues to have debilitating back pain which prevents her mobilization.  I discussed options as I see them.  I do not think that she is well suited for a multisegment spinal stabilization procedure both because of operative length and exposure.  I discussed proceeding with T10-T12 posterior percutaneous pedicle screw fixation with methylmethacrylate augmentation in hopes of stabilizing this fracture and allowing it to heal.  I discussed the risks involved with surgery including but not limited to the risk of anesthesia, bleeding, infection, CSF leak, nerve root injury, spinal cord injury, fusion failure, speech clear, continued pain, and nonbenefit.  Patient has been given the opportunity ask questions.  She appears to understand.  She wishes to proceed with surgery.  Plan for surgery Thursday morning.

## 2019-02-13 NOTE — Consult Note (Signed)
Anesthesiology note:  Ms. Victoria Adams is a 78 year old female who suffered a fall 2 weeks ago resulting in a T10/T11 spinal fracture producing severe pain but no  myelopathic symptoms.  Her medical history is notable for an admission for acute heart failure and Non-Stemi  08/22/13. Cardiac cath at that time revealed moderate, diffuse LAD disease which was treated medically. She was noted to have an EF of 20-25% at that time. She was subsequently   treated with CRT therapy with a biventricular defibrillator.  Her last Echo on 02/08/2019 showed   LV ejection fraction 60 to 65%.  Normal right ventricular systolic function with trace mitral regurgitation no aortic valve  stenosis or regurgitation. She denies chest pain or SOB.  She also has obesity hypertension, type 2 diabetes, moderate renal insufficiency, GERD,  and osteopenia.    The patient will require pedicle screw fixation from T10-T12 under general anesthesia.  She has expressed  anxiety about undergoing the surgery given her medical condition.  BUN/Cr-22/1.32 K- 4.3 Na- 139 H/H- 12.5/38.5 Platelets- 138,000 SAR Covid (-) 10/28  CXR: 02/07/19: Cardiomegaly without acute abnormality of the lungs. No focal airspace opacity.  VS: T- 36.4 BP- 104/55 HR- 70 RR- 18 O2 Sat 93% on RA  Heart RRR- no Murmurs Lungs- clear anteriorly, breathing unlabored.  Impression: 78 year old female with with a previous history CHF and Non-Stemi treated with CRT  who appears to have improved cardiac function. There no evidence of CHF or unstable cardiac conditons and this time.    I discussed the the need for spinal fusion surgery  with [name] and her daughter.  I also explained the anesthetic options and the need for general anesthesia.  We will plan to use an arterial line for monitoring and possibly a central line for venous access. Her Medtronic AICD will need to be interrogated prior to surgery.  I explained that based on her present medical condition she  would be at moderate risk for surgery.  She and her daughter understood and all their questions were answered.   Roberts Gaudy

## 2019-02-13 NOTE — Progress Notes (Signed)
PROGRESS NOTE    Victoria Adams  [id number] DOB: April 18, 1940 DOA: 02/07/2019 PCP: Caryl Bis, MD   Brief Narrative:  Patient is a 78 year old female with history of hypertension, diabetes, systolic congestive heart failure, proximal A. Fib who presents with fall about 2 weeks ago, increased back pain since then.  Imaging on presentation showed worsening of compression fracture of thoracic spine concerning for unstable fracture.  Neurosurgery consulted.  Neurosurgery planning for surgery for stabilization later this week.  Currently on a strict bedrest.  Assessment & Plan:   Principal Problem:   Thoracic compression fracture [facility]) Active Problems:   Essential hypertension, benign   Atrial fibrillation (HCC)   Type 2 diabetes mellitus with complication (HCC)   Dyspnea   Back pain   T10-T11 thoracic compression fracture: Progression as compared to 2 weeks ago associated with severe back pain.  Neurosurgery following.  Advised MRI but patient has biventricular pacemaker.  Patient also could not tolerate MRI due to claustrophobia.  Was on TSLO which has been discontinued.  Neurosurgery planning for surgery for stabilization.  Continues to complain of back pain.  Plan for neurosurgical procedure later this week.  Bilateral lower extremity edema/history of systolic CHF: Last echo in clinic in with normal left ventricular dysfunction and normal right ventricular function, she might have left ventricular recovery with CRT device.  Denies any shortness of breath.  Has mild lower extremity bilateral edema.  She is on Lasix 80 mg twice a day at home, on spironolactone, on discharge.  She was hypotensive on presentation so Lasix was held.  Echocardiogram done on 10/29 showed ejection fraction of 60-65%.  Lasix 40 mg daily resumed.  She may not need Entresto or Aldactone because her ejection fraction is recovered.  She needs to follow-up with cardiology as an outpatient  Hypertension: Continue  current medicines.  Continue to monitor BP.  Currently BP stable  Paroxysmal A. fib: Rate is controlled.  Continue Coreg.  Eliquis on hold for surgical planning  Diabetes mellitus: Continue sliding scale  insulin.  CKD stage III: Currently kidney function is at baseline  Morbid obesity: BMI of 40         DVT prophylaxis: Heparin [place] Code Status:  Full Family Communication: Family member present at the bedside Disposition Plan: Undetermined.  Awaiting neurosurgical intervention.  Currently on a strict bedrest.Needs PT evaluation after surgery   Consultants: Neurosurgery  Procedures: None  Antimicrobials:  Anti-infectives (From admission, onward)   None      Subjective: Patient seen and examined the bedside this morning.  Hemodynamically stable.  No new issues since yesterday.  Pain looks well controlled.  Looked comfortable.  Objective: Vitals:   02/12/19 1554 02/12/19 1941 02/13/19 0329 02/13/19 0745  BP: (!) 149/65 (!) 142/68 (!) 110/56 (!) 104/55  Pulse: 64 70 69 74  Resp:   17 18  Temp: 98.2 F (36.8 C) (!) 97.4 F (36.3 C) 98.1 F (36.7 C) (!) 97.5 F (36.4 C)  TempSrc: Oral Oral Oral Oral  SpO2: 96% 94% 96% 93%  Weight:      Height:        Intake/Output Summary (Last 24 hours) at 02/13/2019 1310 Last data filed at 02/13/2019 1044 Gross per 24 hour  Intake 963 ml  Output 2750 ml  Net -1787 ml   Filed Weights   02/10/19 0500 02/11/19 0500 02/12/19 0500  Weight: 117.7 kg 115.9 kg 115.4 kg    Examination:  General exam: Not in distress,morbidly obese HEENT:PERRL,Oral  mucosa moist, Ear/Nose normal on gross exam Respiratory system: Bilateral equal air entry, normal vesicular breath sounds, no wheezes or crackles  Cardiovascular system: S1 & S2 heard, RRR. No JVD, murmurs, rubs, gallops or clicks. Trace pedal edema. Gastrointestinal system: Abdomen is mildly distended, soft and nontender. No organomegaly or masses felt. Normal bowel sounds heard.  Central nervous system: Alert and oriented. No focal neurological deficits. Extremities: Trace bilateral lower extremity edema, no clubbing ,no cyanosis, distal peripheral pulses palpable. Skin: No rashes, lesions or ulcers,no icterus ,no pallor    Data Reviewed: I have personally reviewed following labs and imaging studies  CBC: Recent Labs  Lab 02/07/19 1644 02/11/19 0415  WBC 6.1 4.6  HGB 13.3 12.5  HCT 42.6 38.5  MCV 95.7 93.4  PLT 199 0000000*   Basic Metabolic Panel: Recent Labs  Lab 02/07/19 1644 02/11/19 0415 02/12/19 0502  NA 138 136 139  K 4.0 4.1 4.3  CL 101 100 103  CO2 31 25 27   GLUCOSE 160* 149* 138*  BUN 21 27* 22  CREATININE 1.26* 1.41* 1.32*  CALCIUM 8.7* 8.7* 9.0   GFR: Estimated Creatinine Clearance: 47.6 mL/min (A) (by C-G formula based on SCr of 1.32 mg/dL (H)). Liver Function Tests: No results for input(s): AST, ALT, ALKPHOS, BILITOT, PROT, ALBUMIN in the last 168 hours. No results for input(s): LIPASE, AMYLASE in the last 168 hours. No results for input(s): AMMONIA in the last 168 hours. Coagulation Profile: No results for input(s): INR, PROTIME in the last 168 hours. Cardiac Enzymes: No results for input(s): CKTOTAL, CKMB, CKMBINDEX, TROPONINI in the last 168 hours. BNP (last 3 results) No results for input(s): PROBNP in the last 8760 hours. HbA1C: No results for input(s): HGBA1C in the last 72 hours. CBG: Recent Labs  Lab 02/12/19 1152 02/12/19 1613 02/12/19 2131 02/13/19 0643 02/13/19 1133  GLUCAP 159* 168* 237* 155* 186*   Lipid Profile: No results for input(s): CHOL, HDL, LDLCALC, TRIG, CHOLHDL, LDLDIRECT in the last 72 hours. Thyroid Function Tests: No results for input(s): TSH, T4TOTAL, FREET4, T3FREE, THYROIDAB in the last 72 hours. Anemia Panel: No results for input(s): VITAMINB12, FOLATE, FERRITIN, TIBC, IRON, RETICCTPCT in the last 72 hours. Sepsis Labs: No results for input(s): PROCALCITON, LATICACIDVEN in the last 168  hours.  Recent Results (from the past 240 hour(s))  SARS CORONAVIRUS 2 (TAT 6-24 HRS) Nasopharyngeal Nasopharyngeal Swab     Status: None   Collection Time: 02/07/19  6:23 PM   Specimen: Nasopharyngeal Swab  Result Value Ref Range Status   SARS Coronavirus 2 NEGATIVE NEGATIVE Final    Comment: (NOTE) SARS-CoV-2 target nucleic acids are NOT DETECTED. The SARS-CoV-2 RNA is generally detectable in upper and lower respiratory specimens during the acute phase of infection. Negative results do not preclude SARS-CoV-2 infection, do not rule out co-infections with other pathogens, and should not be used as the sole basis for treatment or other patient management decisions. Negative results must be combined with clinical observations, patient history, and epidemiological information. The expected result is Negative. Fact Sheet for Patients: SugarRoll.be Fact Sheet for Healthcare Providers: https://www.woods-mathews.com/ This test is not yet approved or cleared by the [place] FDA and  has been authorized for detection and/or diagnosis of SARS-CoV-2 by FDA under an Emergency Use Authorization (EUA). This EUA will remain  in effect (meaning this test can be used) for the duration of the COVID-19 declaration under Section 56 4(b)(1) of the Act, 21 U.S.C. section 360bbb-3(b)(1), unless the authorization is terminated or  revoked sooner. Performed at [facility], [place] [address] [place], [place] [address]          Radiology Studies: No results found.      Scheduled Meds: . carvedilol  12.5 mg Oral BID WC  . colchicine  0.6 mg Oral Daily  . docusate sodium  100 mg Oral BID  . ferrous sulfate  325 mg Oral Q breakfast  . furosemide  40 mg Oral Daily  . heparin injection (subcutaneous)  5,000 Units Subcutaneous Q8H  . insulin aspart  0-9 Units Subcutaneous TID WC  . pantoprazole  40 mg Oral Daily  . polyethylene glycol  17 g Oral  Daily  . potassium chloride SA  20 mEq Oral Daily  . rosuvastatin  20 mg Oral Daily  . sacubitril-valsartan  1 tablet Oral BID  . sertraline  100 mg Oral Daily  . sodium chloride flush  3 mL Intravenous Q12H  . spironolactone  12.5 mg Oral Daily   Continuous Infusions: . sodium chloride       LOS: 6 days    Time spent: 25 mins.More than 50% of that time was spent in counseling and/or coordination of care.      Shelly Coss, MD [facility] Pager [phone]  If 7PM-7AM, please contact night-coverage [email or web address] Password [email or web address] 02/13/2019, 1:10 PM

## 2019-02-13 NOTE — Plan of Care (Signed)

## 2019-02-13 NOTE — Progress Notes (Signed)
Contacted Dr Windy Carina office concerning MRI order. Patient states would need to be under anesthesia to tolerate the MRI due to pain. Awaiting return call.

## 2019-02-13 NOTE — Progress Notes (Signed)
Per Dr Windy Carina office, cancel MRI order. Exam has been cancelled.

## 2019-02-14 LAB — GLUCOSE, CAPILLARY
Glucose-Capillary: 143 mg/dL — ABNORMAL HIGH (ref 70–99)
Glucose-Capillary: 188 mg/dL — ABNORMAL HIGH (ref 70–99)
Glucose-Capillary: 233 mg/dL — ABNORMAL HIGH (ref 70–99)
Glucose-Capillary: 190 mg/dL — ABNORMAL HIGH (ref 70–99)

## 2019-02-14 MED ORDER — SENNOSIDES-DOCUSATE SODIUM 8.6-50 MG PO TABS: 2.0000 | ORAL_TABLET | Freq: Every evening | ORAL | Status: DC | PRN

## 2019-02-14 MED ORDER — IPRATROPIUM-ALBUTEROL 0.5-2.5 (3) MG/3ML IN SOLN: 3.0000 mL | Freq: Four times a day (QID) | RESPIRATORY_TRACT | Status: DC | PRN

## 2019-02-14 MED ORDER — FLEET ENEMA 7-19 GM/118ML RE ENEM
1.0000 | ENEMA | Freq: Once | RECTAL | Status: AC
  Administered 2019-02-14: 1 via RECTAL
  Filled 2019-02-14: qty 1

## 2019-02-14 MED ORDER — POLYETHYLENE GLYCOL 3350 17 G PO PACK
17.0000 g | PACK | Freq: Every day | ORAL | Status: DC
  Administered 2019-02-14 – 2019-02-20 (×6): 17 g via ORAL
  Filled 2019-02-14 (×6): qty 1

## 2019-02-14 MED ORDER — DIPHENHYDRAMINE HCL 25 MG PO CAPS
25.0000 mg | ORAL_CAPSULE | Freq: Four times a day (QID) | ORAL | Status: DC | PRN
  Administered 2019-02-14: 25 mg via ORAL
  Filled 2019-02-14: qty 1

## 2019-02-14 NOTE — Care Management Important Message (Signed)
Important Message  Patient Details  Name: Victoria Adams MRN: [id number] Date of Birth: 08-01-1940   Medicare Important Message Given:  Yes     Starling Jessie [address] Circle 02/14/2019, 3:03 PM

## 2019-02-14 NOTE — Plan of Care (Signed)

## 2019-02-14 NOTE — TOC Initial Note (Signed)
Transition of Care [facility]) - Initial/Assessment Note    Patient Details  Name: Victoria Adams MRN: [id number] Date of Birth: 06/22/1940  Transition [facility]) CM/SW Contact:    Midge Minium RN, BSN, NCM-BC, ACM-RN [phone] Phone Number: 02/14/2019, 3:47 PM  Clinical Narrative:                 CM following for dispositional needs. Patient lived at home and presented with a fall; imaging showed worsening of compression fracture of thoracic spine concerning for unstable fracture; patient scheduled for stabilization surgery on 11/5. CM team will continue to follow for PT/OT recommendations.   Expected Discharge Plan: [place] Barriers to Discharge: Continued Medical Work up   Expected Discharge Plan and Services Expected Discharge Plan: [place]   Discharge Planning Services: CM Consult    Activities of Daily Living Home Assistive Devices/Equipment: CBG Meter, Dentures (specify type) ADL Screening (condition at time of admission) Patient's cognitive ability adequate to safely complete daily activities?: Yes Is the patient deaf or have difficulty hearing?: No Does the patient have difficulty seeing, even when wearing glasses/contacts?: No Does the patient have difficulty concentrating, remembering, or making decisions?: No Patient able to express need for assistance with ADLs?: Yes Does the patient have difficulty dressing or bathing?: Yes Independently performs ADLs?: Yes (appropriate for developmental age) Does the patient have difficulty walking or climbing stairs?: Yes Weakness of Legs: Both Weakness of Arms/Hands: None   Admission diagnosis:  Mult fractures of thoracic spine, closed, initial encounter ([place]) [S22.009A] Congestive heart failure, unspecified HF chronicity, unspecified heart failure type ([place]) [I50.9] Patient Active Problem List   Diagnosis Date Noted  . Back pain 02/08/2019  . Thoracic compression fracture ([place])  02/07/2019  . Dyspnea 02/07/2019  . Right foot pain   . Hyponatremia 06/12/2016  . Flu-like symptoms 06/12/2016  . AKI (acute kidney injury) ([place]) 06/12/2016  . Weakness generalized 06/12/2016  . Lactic acid acidosis 06/12/2016  . Type 2 diabetes mellitus with complication ([place])   . Falls 09/13/2014  . CKD (chronic kidney disease), stage III ([place]) 09/13/2014  . Benign neoplasm of colon 12/20/2013  . Hyperkalemia 10/03/2013  . Orthostatic hypotension 10/03/2013  . IDA (iron deficiency anemia) 08/24/2013  . NSTEMI (non-ST elevated myocardial infarction) ([place]) 08/22/2013  . Long term (current) use of anticoagulants 07/20/2010  . NICM (nonischemic cardiomyopathy) ([place]) 10/28/2009  . LBBB (left bundle branch block) 10/28/2009  . Atrial fibrillation ([place]) 10/28/2009  . Hyperlipidemia 06/06/2009  . Morbid obesity ([place]) 06/06/2009  . Essential hypertension, benign 06/06/2009  . Atherosclerosis of native coronary artery 06/06/2009  . Chronic systolic heart failure ([place]) 06/06/2009   PCP:  Caryl Bis, MD Pharmacy:   [facility], [place] [address] Phone: [phone] Fax: [phone]     Social Determinants of Health (SDOH) Interventions    Readmission Risk Interventions No flowsheet data found.

## 2019-02-14 NOTE — Anesthesia Preprocedure Evaluation (Addendum)
Anesthesia Evaluation  Patient identified by MRN, date of birth, ID band Patient awake    Reviewed: Allergy & Precautions, NPO status , Patient's Chart, lab work & pertinent test results, reviewed documented beta blocker date and time   History of Anesthesia Complications (+) PONV and history of anesthetic complications  Airway Mallampati: II  TM Distance: >3 FB Neck ROM: Full    Dental  (+) Partial Upper, Partial Lower   Pulmonary neg pulmonary ROS,    Pulmonary exam normal        Cardiovascular hypertension, Pt. on home beta blockers and Pt. on medications + CAD and + Past MI (NSTEMI 2015)  Normal cardiovascular exam+ dysrhythmias Atrial Fibrillation + pacemaker   TTE 02/08/19: EF 60-65%, biventricular pacemaker, mild TR, mildly elevated RVSP 37.3 mmHg    Neuro/Psych negative neurological ROS  negative psych ROS   GI/Hepatic Neg liver ROS, GERD  Medicated and Controlled,  Endo/Other  diabetes, Type 2, Insulin DependentMorbid obesity  Renal/GU Renal InsufficiencyRenal disease (Cr 1.32)  negative genitourinary   Musculoskeletal  (+) Arthritis ,   Abdominal   Peds  Hematology negative hematology ROS (+)   Anesthesia Other Findings Day of surgery medications reviewed with patient.  Reproductive/Obstetrics negative OB ROS                           Anesthesia Physical Anesthesia Plan  ASA: III  Anesthesia Plan: General   Post-op Pain Management:    Induction: Intravenous  PONV Risk Score and Plan: 4 or greater and Treatment may vary due to age or medical condition, Ondansetron and Dexamethasone  Airway Management Planned: Oral ETT  Additional Equipment: None  Intra-op Plan:   Post-operative Plan: Extubation in OR  Informed Consent: I have reviewed the patients History and Physical, chart, labs and discussed the procedure including the risks, benefits and alternatives for the  proposed anesthesia with the patient or authorized representative who has indicated his/her understanding and acceptance.     Dental advisory given  Plan Discussed with: CRNA  Anesthesia Plan Comments:       Anesthesia Quick Evaluation

## 2019-02-14 NOTE — Progress Notes (Signed)
PROGRESS NOTE    Victoria Adams  [id number] DOB: 01/13/1941 DOA: 02/07/2019 PCP: Caryl Bis, MD   Brief Narrative:  Patient is a 78 year old female with history of hypertension, diabetes, systolic congestive heart failure, proximal A. Fib who presents with fall about 2 weeks ago, increased back pain since then.  Imaging on presentation showed worsening of compression fracture of thoracic spine concerning for unstable fracture.  Neurosurgery consulted.  Neurosurgery planning for surgery for stabilization tomorrow.  Currently on a strict bedrest.  Assessment & Plan:   Principal Problem:   Thoracic compression fracture [facility]) Active Problems:   Essential hypertension, benign   Atrial fibrillation (HCC)   Type 2 diabetes mellitus with complication (HCC)   Dyspnea   Back pain   T10-T11 thoracic compression fracture: Progression as compared to 2 weeks ago associated with severe back pain.  Neurosurgery following.  Advised MRI but patient has biventricular pacemaker.  Patient also could not tolerate MRI due to claustrophobia.  Was on TSLO which has been discontinued.  Neurosurgery planning for surgery (T10-T12 posterior percutaneous pedicle screw fixation with methylmethacrylate augmentation)  for stabilization.  Continues to complain of back pain.  Plan for neurosurgical procedure tomorrow.  Bilateral lower extremity edema/history of systolic CHF: Last echo in clinic in with normal left ventricular dysfunction and normal right ventricular function, she might have left ventricular recovery with CRT device.  Denies any shortness of breath.  Has mild lower extremity bilateral edema.  She is on Lasix 80 mg twice a day at home, on spironolactone, on discharge.  She was hypotensive on presentation so Lasix was held.  Echocardiogram done on 10/29 showed ejection fraction of 60-65%.  Lasix 40 mg daily resumed.  She may not need Entresto or Aldactone because her ejection fraction is recovered.  She  needs to follow-up with cardiology as an outpatient  Hypertension: Continue current medicines.  Continue to monitor BP.  Currently BP stable  Paroxysmal A. fib: Rate is controlled.  Continue Coreg.  Eliquis on hold for surgical planning  Diabetes mellitus: Continue sliding scale  insulin.  CKD stage III: Currently kidney function is at baseline  Morbid obesity: BMI of 40         DVT prophylaxis: Heparin [place] Code Status:  Full Family Communication: Family member present at the bedside Disposition Plan: Undetermined.  Awaiting neurosurgical intervention.  Currently on a strict bedrest.Needs PT evaluation after surgery   Consultants: Neurosurgery  Procedures: None  Antimicrobials:  Anti-infectives (From admission, onward)   None      Subjective: Patient seen and examined bedside this morning.  No new issues or complaints.  Back pain well controlled during my evaluation.  Awaiting for surgical procedure tomorrow..  Objective: Vitals:   02/13/19 1700 02/13/19 2024 02/14/19 0346 02/14/19 0801  BP: 123/61 (!) 94/41 (!) 112/53 (!) 130/58  Pulse: 73 65 69 75  Resp: 19 18 17 18   Temp:  (!) 97.5 F (36.4 C) 98.2 F (36.8 C) 98.7 F (37.1 C)  TempSrc:  Oral Oral Oral  SpO2: 94% 94% 96% 92%  Weight:      Height:        Intake/Output Summary (Last 24 hours) at 02/14/2019 1408 Last data filed at 02/14/2019 1100 Gross per 24 hour  Intake 1148 ml  Output 600 ml  Net 548 ml   Filed Weights   02/10/19 0500 02/11/19 0500 02/12/19 0500  Weight: 117.7 kg 115.9 kg 115.4 kg    Examination:  General exam: Not in  distress,morbidly obese HEENT:PERRL,Oral mucosa moist, Ear/Nose normal on gross exam Respiratory system: Bilateral equal air entry, normal vesicular breath sounds, no wheezes or crackles  Cardiovascular system: S1 & S2 heard, RRR. No JVD, murmurs, rubs, gallops or clicks. Trace pedal edema. Gastrointestinal system: Abdomen is mildly distended, soft and nontender. No  organomegaly or masses felt. Normal bowel sounds heard. Central nervous system: Alert and oriented. No focal neurological deficits. Extremities: Trace bilateral lower extremity edema, no clubbing ,no cyanosis, distal peripheral pulses palpable. Skin: No rashes, lesions or ulcers,no icterus ,no pallor    Data Reviewed: I have personally reviewed following labs and imaging studies  CBC: Recent Labs  Lab 02/07/19 1644 02/11/19 0415  WBC 6.1 4.6  HGB 13.3 12.5  HCT 42.6 38.5  MCV 95.7 93.4  PLT 199 0000000*   Basic Metabolic Panel: Recent Labs  Lab 02/07/19 1644 02/11/19 0415 02/12/19 0502  NA 138 136 139  K 4.0 4.1 4.3  CL 101 100 103  CO2 31 25 27   GLUCOSE 160* 149* 138*  BUN 21 27* 22  CREATININE 1.26* 1.41* 1.32*  CALCIUM 8.7* 8.7* 9.0   GFR: Estimated Creatinine Clearance: 47.6 mL/min (A) (by C-G formula based on SCr of 1.32 mg/dL (H)). Liver Function Tests: No results for input(s): AST, ALT, ALKPHOS, BILITOT, PROT, ALBUMIN in the last 168 hours. No results for input(s): LIPASE, AMYLASE in the last 168 hours. No results for input(s): AMMONIA in the last 168 hours. Coagulation Profile: No results for input(s): INR, PROTIME in the last 168 hours. Cardiac Enzymes: No results for input(s): CKTOTAL, CKMB, CKMBINDEX, TROPONINI in the last 168 hours. BNP (last 3 results) No results for input(s): PROBNP in the last 8760 hours. HbA1C: No results for input(s): HGBA1C in the last 72 hours. CBG: Recent Labs  Lab 02/13/19 1133 02/13/19 1701 02/13/19 2103 02/14/19 0634 02/14/19 1107  GLUCAP 186* 189* 155* 143* 188*   Lipid Profile: No results for input(s): CHOL, HDL, LDLCALC, TRIG, CHOLHDL, LDLDIRECT in the last 72 hours. Thyroid Function Tests: No results for input(s): TSH, T4TOTAL, FREET4, T3FREE, THYROIDAB in the last 72 hours. Anemia Panel: No results for input(s): VITAMINB12, FOLATE, FERRITIN, TIBC, IRON, RETICCTPCT in the last 72 hours. Sepsis Labs: No results  for input(s): PROCALCITON, LATICACIDVEN in the last 168 hours.  Recent Results (from the past 240 hour(s))  SARS CORONAVIRUS 2 (TAT 6-24 HRS) Nasopharyngeal Nasopharyngeal Swab     Status: None   Collection Time: 02/07/19  6:23 PM   Specimen: Nasopharyngeal Swab  Result Value Ref Range Status   SARS Coronavirus 2 NEGATIVE NEGATIVE Final    Comment: (NOTE) SARS-CoV-2 target nucleic acids are NOT DETECTED. The SARS-CoV-2 RNA is generally detectable in upper and lower respiratory specimens during the acute phase of infection. Negative results do not preclude SARS-CoV-2 infection, do not rule out co-infections with other pathogens, and should not be used as the sole basis for treatment or other patient management decisions. Negative results must be combined with clinical observations, patient history, and epidemiological information. The expected result is Negative. Fact Sheet for Patients: SugarRoll.be Fact Sheet for Healthcare Providers: https://www.woods-mathews.com/ This test is not yet approved or cleared by the [place] FDA and  has been authorized for detection and/or diagnosis of SARS-CoV-2 by FDA under an Emergency Use Authorization (EUA). This EUA will remain  in effect (meaning this test can be used) for the duration of the COVID-19 declaration under Section 56 4(b)(1) of the Act, 21 U.S.C. section 360bbb-3(b)(1), unless the authorization  is terminated or revoked sooner. Performed at [facility], [place] [address], [place], [place] [address]          Radiology Studies: No results found.      Scheduled Meds: . carvedilol  12.5 mg Oral BID WC  . colchicine  0.6 mg Oral Daily  . docusate sodium  100 mg Oral BID  . ferrous sulfate  325 mg Oral Q breakfast  . furosemide  40 mg Oral Daily  . heparin injection (subcutaneous)  5,000 Units Subcutaneous Q8H  . insulin aspart  0-9 Units Subcutaneous TID WC  .  pantoprazole  40 mg Oral Daily  . polyethylene glycol  17 g Oral Daily  . potassium chloride SA  20 mEq Oral Daily  . rosuvastatin  20 mg Oral Daily  . sacubitril-valsartan  1 tablet Oral BID  . sertraline  100 mg Oral Daily  . sodium chloride flush  3 mL Intravenous Q12H  . spironolactone  12.5 mg Oral Daily   Continuous Infusions: . sodium chloride       LOS: 7 days    Time spent: 25 mins.More than 50% of that time was spent in counseling and/or coordination of care.      Shelly Coss, MD [facility] Pager [phone]  If 7PM-7AM, please contact night-coverage [email or web address] Password [email or web address] 02/14/2019, 2:08 PM

## 2019-02-15 ENCOUNTER — Inpatient Hospital Stay (HOSPITAL_COMMUNITY): Payer: Medicare Other

## 2019-02-15 ENCOUNTER — Encounter (HOSPITAL_COMMUNITY)
Admission: EM | Disposition: A | Discharge status: Home Health | Payer: Self-pay | Source: Home / Self Care | Attending: Internal Medicine

## 2019-02-15 ENCOUNTER — Inpatient Hospital Stay (HOSPITAL_COMMUNITY): Payer: Medicare Other | Admitting: Anesthesiology

## 2019-02-15 ENCOUNTER — Encounter (HOSPITAL_COMMUNITY): Payer: Self-pay | Admitting: Anesthesiology

## 2019-02-15 HISTORY — PX: LUMBAR PERCUTANEOUS PEDICLE SCREW 2 LEVEL: SHX5561

## 2019-02-15 LAB — GLUCOSE, CAPILLARY
Glucose-Capillary: 202 mg/dL — ABNORMAL HIGH (ref 70–99)
Glucose-Capillary: 263 mg/dL — ABNORMAL HIGH (ref 70–99)
Glucose-Capillary: 327 mg/dL — ABNORMAL HIGH (ref 70–99)
Glucose-Capillary: 344 mg/dL — ABNORMAL HIGH (ref 70–99)
Glucose-Capillary: 282 mg/dL — ABNORMAL HIGH (ref 70–99)

## 2019-02-15 SURGERY — LUMBAR PERCUTANEOUS PEDICLE SCREW 2 LEVEL
Anesthesia: General | Site: Spine Thoracic

## 2019-02-15 MED ORDER — LABETALOL HCL 5 MG/ML IV SOLN
INTRAVENOUS | Status: AC
  Filled 2019-02-15: qty 4

## 2019-02-15 MED ORDER — ACETAMINOPHEN 10 MG/ML IV SOLN
1000.0000 mg | Freq: Once | INTRAVENOUS | Status: AC
  Administered 2019-02-15: 1000 mg via INTRAVENOUS

## 2019-02-15 MED ORDER — FENTANYL CITRATE (PF) 250 MCG/5ML IJ SOLN
INTRAMUSCULAR | Status: AC
  Filled 2019-02-15: qty 5

## 2019-02-15 MED ORDER — PROPOFOL 10 MG/ML IV BOLUS
INTRAVENOUS | Status: AC
  Filled 2019-02-15: qty 20

## 2019-02-15 MED ORDER — INSULIN ASPART 100 UNIT/ML ~~LOC~~ SOLN
SUBCUTANEOUS | Status: AC
  Filled 2019-02-15: qty 1

## 2019-02-15 MED ORDER — MIDAZOLAM HCL 2 MG/2ML IJ SOLN
INTRAMUSCULAR | Status: AC
  Filled 2019-02-15: qty 2

## 2019-02-15 MED ORDER — CARVEDILOL 25 MG PO TABS
25.0000 mg | ORAL_TABLET | Freq: Two times a day (BID) | ORAL | Status: DC
  Administered 2019-02-15 – 2019-02-21 (×13): 25 mg via ORAL
  Filled 2019-02-15 (×13): qty 1

## 2019-02-15 MED ORDER — 0.9 % SODIUM CHLORIDE (POUR BTL) OPTIME
TOPICAL | Status: DC | PRN
  Administered 2019-02-15: 1000 mL

## 2019-02-15 MED ORDER — ONDANSETRON HCL 4 MG/2ML IJ SOLN
INTRAMUSCULAR | Status: DC | PRN
  Administered 2019-02-15 (×2): 4 mg via INTRAVENOUS

## 2019-02-15 MED ORDER — PROMETHAZINE HCL 25 MG/ML IJ SOLN
6.2500 mg | INTRAMUSCULAR | Status: DC | PRN
  Administered 2019-02-15: 6.25 mg via INTRAVENOUS

## 2019-02-15 MED ORDER — ONDANSETRON HCL 4 MG/2ML IJ SOLN
INTRAMUSCULAR | Status: AC
  Filled 2019-02-15: qty 2

## 2019-02-15 MED ORDER — THROMBIN 5000 UNITS EX SOLR
CUTANEOUS | Status: AC
  Filled 2019-02-15: qty 10000

## 2019-02-15 MED ORDER — HEMOSTATIC AGENTS (NO CHARGE) OPTIME
TOPICAL | Status: DC | PRN
  Administered 2019-02-15: 1 via TOPICAL

## 2019-02-15 MED ORDER — CEFAZOLIN SODIUM-DEXTROSE 2-3 GM-%(50ML) IV SOLR
INTRAVENOUS | Status: DC | PRN
  Administered 2019-02-15: 3 g via INTRAVENOUS

## 2019-02-15 MED ORDER — CEFAZOLIN SODIUM-DEXTROSE 2-4 GM/100ML-% IV SOLN
2.0000 g | Freq: Three times a day (TID) | INTRAVENOUS | Status: AC
  Administered 2019-02-15 (×2): 2 g via INTRAVENOUS
  Filled 2019-02-15 (×2): qty 100

## 2019-02-15 MED ORDER — LABETALOL HCL 5 MG/ML IV SOLN
10.0000 mg | Freq: Once | INTRAVENOUS | Status: AC
  Administered 2019-02-15: 10 mg via INTRAVENOUS

## 2019-02-15 MED ORDER — PHENYLEPHRINE HCL-NACL 10-0.9 MG/250ML-% IV SOLN
INTRAVENOUS | Status: DC | PRN
  Administered 2019-02-15: 20 ug/min via INTRAVENOUS
  Administered 2019-02-15: 08:00:00 via INTRAVENOUS

## 2019-02-15 MED ORDER — FENTANYL CITRATE (PF) 100 MCG/2ML IJ SOLN
INTRAMUSCULAR | Status: DC | PRN
  Administered 2019-02-15 (×7): 50 ug via INTRAVENOUS
  Administered 2019-02-15: 100 ug via INTRAVENOUS
  Administered 2019-02-15: 50 ug via INTRAVENOUS

## 2019-02-15 MED ORDER — SODIUM CHLORIDE 0.9 % IV SOLN
INTRAVENOUS | Status: DC | PRN
  Administered 2019-02-15: 08:00:00

## 2019-02-15 MED ORDER — HYDROMORPHONE HCL 1 MG/ML IJ SOLN
INTRAMUSCULAR | Status: AC
  Filled 2019-02-15: qty 1

## 2019-02-15 MED ORDER — BUPIVACAINE HCL (PF) 0.25 % IJ SOLN
INTRAMUSCULAR | Status: AC
  Filled 2019-02-15: qty 30

## 2019-02-15 MED ORDER — DEXAMETHASONE SODIUM PHOSPHATE 10 MG/ML IJ SOLN
INTRAMUSCULAR | Status: DC | PRN
  Administered 2019-02-15: 10 mg via INTRAVENOUS

## 2019-02-15 MED ORDER — SUGAMMADEX SODIUM 200 MG/2ML IV SOLN
INTRAVENOUS | Status: DC | PRN
  Administered 2019-02-15: 200 mg via INTRAVENOUS

## 2019-02-15 MED ORDER — BUPIVACAINE HCL (PF) 0.25 % IJ SOLN
INTRAMUSCULAR | Status: DC | PRN
  Administered 2019-02-15: 20 mL

## 2019-02-15 MED ORDER — ACETAMINOPHEN 10 MG/ML IV SOLN
INTRAVENOUS | Status: AC
  Filled 2019-02-15: qty 100

## 2019-02-15 MED ORDER — DEXAMETHASONE SODIUM PHOSPHATE 10 MG/ML IJ SOLN
INTRAMUSCULAR | Status: AC
  Filled 2019-02-15: qty 1

## 2019-02-15 MED ORDER — FENTANYL CITRATE (PF) 100 MCG/2ML IJ SOLN: 25.0000 ug | INTRAMUSCULAR | Status: DC | PRN

## 2019-02-15 MED ORDER — ROCURONIUM BROMIDE 50 MG/5ML IV SOSY
PREFILLED_SYRINGE | INTRAVENOUS | Status: DC | PRN
  Administered 2019-02-15: 60 mg via INTRAVENOUS

## 2019-02-15 MED ORDER — THROMBIN 5000 UNITS EX SOLR
CUTANEOUS | Status: DC | PRN
  Administered 2019-02-15 (×2): 5000 [IU] via TOPICAL

## 2019-02-15 MED ORDER — ROCURONIUM BROMIDE 10 MG/ML (PF) SYRINGE
PREFILLED_SYRINGE | INTRAVENOUS | Status: AC
  Filled 2019-02-15: qty 10

## 2019-02-15 MED ORDER — MIDAZOLAM HCL 5 MG/5ML IJ SOLN
INTRAMUSCULAR | Status: DC | PRN
  Administered 2019-02-15: 2 mg via INTRAVENOUS

## 2019-02-15 MED ORDER — LACTATED RINGERS IV SOLN
INTRAVENOUS | Status: DC | PRN
  Administered 2019-02-15: 07:00:00 via INTRAVENOUS

## 2019-02-15 MED ORDER — PROPOFOL 10 MG/ML IV BOLUS
INTRAVENOUS | Status: DC | PRN
  Administered 2019-02-15: 170 mg via INTRAVENOUS

## 2019-02-15 MED ORDER — LIDOCAINE 2% (20 MG/ML) 5 ML SYRINGE
INTRAMUSCULAR | Status: AC
  Filled 2019-02-15: qty 5

## 2019-02-15 MED ORDER — HYDROMORPHONE HCL 1 MG/ML IJ SOLN
1.0000 mg | Freq: Once | INTRAMUSCULAR | Status: AC
  Administered 2019-02-15: 1 mg via INTRAVENOUS
  Filled 2019-02-15: qty 1

## 2019-02-15 MED ORDER — LABETALOL HCL 5 MG/ML IV SOLN: 10.0000 mg | Freq: Once | INTRAVENOUS | Status: DC

## 2019-02-15 MED ORDER — CEFAZOLIN SODIUM 1 G IJ SOLR
INTRAMUSCULAR | Status: AC
  Filled 2019-02-15: qty 30

## 2019-02-15 MED ORDER — PROMETHAZINE HCL 25 MG/ML IJ SOLN
INTRAMUSCULAR | Status: AC
  Filled 2019-02-15: qty 1

## 2019-02-15 MED ORDER — LIDOCAINE 2% (20 MG/ML) 5 ML SYRINGE
INTRAMUSCULAR | Status: DC | PRN
  Administered 2019-02-15: 60 mg via INTRAVENOUS
  Administered 2019-02-15: 40 mg via INTRAVENOUS

## 2019-02-15 MED ORDER — HYDROMORPHONE HCL 1 MG/ML IJ SOLN
0.2500 mg | INTRAMUSCULAR | Status: DC | PRN
  Administered 2019-02-15: 0.5 mg via INTRAVENOUS

## 2019-02-15 SURGICAL SUPPLY — 63 items
ADH SKN CLS APL DERMABOND .7 (GAUZE/BANDAGES/DRESSINGS) ×1
APL SKNCLS STERI-STRIP NONHPOA (GAUZE/BANDAGES/DRESSINGS) ×1
BAG DECANTER FOR FLEXI CONT (MISCELLANEOUS) ×3 IMPLANT
BENZOIN TINCTURE PRP APPL 2/3 (GAUZE/BANDAGES/DRESSINGS) ×3 IMPLANT
CARTRIDGE OIL MAESTRO DRILL (MISCELLANEOUS) ×1 IMPLANT
CEMENT BONE KYPHX HV R (Orthopedic Implant) ×2 IMPLANT
CEMENT KYPHON C01A KIT/MIXER (Cement) ×2 IMPLANT
CLOSURE WOUND 1/2 X4 (GAUZE/BANDAGES/DRESSINGS) ×1
CONT SPEC 4OZ CLIKSEAL STRL BL (MISCELLANEOUS) ×3 IMPLANT
COVER BACK TABLE 60X90IN (DRAPES) ×3 IMPLANT
DERMABOND ADVANCED (GAUZE/BANDAGES/DRESSINGS) ×2
DERMABOND ADVANCED .7 DNX12 (GAUZE/BANDAGES/DRESSINGS) IMPLANT
DIFFUSER DRILL AIR PNEUMATIC (MISCELLANEOUS) ×3 IMPLANT
DRAPE C-ARM 42X72 X-RAY (DRAPES) ×3 IMPLANT
DRAPE C-ARMOR (DRAPES) ×3 IMPLANT
DRAPE LAPAROTOMY 100X72X124 (DRAPES) ×3 IMPLANT
DRAPE SURG 17X23 STRL (DRAPES) ×12 IMPLANT
DRSG OPSITE POSTOP 4X8 (GAUZE/BANDAGES/DRESSINGS) ×4 IMPLANT
ELECT BLADE 4.0 EZ CLEAN MEGAD (MISCELLANEOUS) ×3
ELECT REM PT RETURN 9FT ADLT (ELECTROSURGICAL) ×3
ELECTRODE BLDE 4.0 EZ CLN MEGD (MISCELLANEOUS) IMPLANT
ELECTRODE REM PT RTRN 9FT ADLT (ELECTROSURGICAL) ×1 IMPLANT
EVACUATOR 1/8 PVC DRAIN (DRAIN) IMPLANT
GAUZE 4X4 16PLY RFD (DISPOSABLE) ×2 IMPLANT
GAUZE SPONGE 4X4 12PLY STRL (GAUZE/BANDAGES/DRESSINGS) ×1 IMPLANT
GLOVE BIO SURGEON STRL SZ 6.5 (GLOVE) ×3 IMPLANT
GLOVE BIO SURGEONS STRL SZ 6.5 (GLOVE) ×2
GLOVE BIOGEL PI IND STRL 6.5 (GLOVE) ×1 IMPLANT
GLOVE BIOGEL PI IND STRL 7.0 (GLOVE) IMPLANT
GLOVE BIOGEL PI IND STRL 7.5 (GLOVE) IMPLANT
GLOVE BIOGEL PI INDICATOR 6.5 (GLOVE) ×6
GLOVE BIOGEL PI INDICATOR 7.0 (GLOVE) ×4
GLOVE BIOGEL PI INDICATOR 7.5 (GLOVE) ×4
GLOVE ECLIPSE 9.0 STRL (GLOVE) ×5 IMPLANT
GOWN STRL REUS W/ TWL LRG LVL3 (GOWN DISPOSABLE) IMPLANT
GOWN STRL REUS W/ TWL XL LVL3 (GOWN DISPOSABLE) ×1 IMPLANT
GOWN STRL REUS W/TWL 2XL LVL3 (GOWN DISPOSABLE) IMPLANT
GOWN STRL REUS W/TWL LRG LVL3 (GOWN DISPOSABLE) ×9
GOWN STRL REUS W/TWL XL LVL3 (GOWN DISPOSABLE) ×6
GUIDEWIRE NITINOL BEVEL TIP (WIRE) ×8 IMPLANT
KIT BASIN OR (CUSTOM PROCEDURE TRAY) ×3 IMPLANT
KIT TURNOVER KIT B (KITS) ×3 IMPLANT
NDL I-PASS III (NEEDLE) IMPLANT
NEEDLE HYPO 22GX1.5 SAFETY (NEEDLE) ×3 IMPLANT
NEEDLE I-PASS III (NEEDLE) ×3 IMPLANT
NS IRRIG 1000ML POUR BTL (IV SOLUTION) ×3 IMPLANT
OIL CARTRIDGE MAESTRO DRILL (MISCELLANEOUS) ×3
PACK LAMINECTOMY NEURO (CUSTOM PROCEDURE TRAY) ×3 IMPLANT
PUSHER RELINE FENS MAS (MISCELLANEOUS) ×8 IMPLANT
RELINE MAS NDL FENS (NEEDLE) IMPLANT
RELINE MAS NEEDLE FENS (NEEDLE) ×12 IMPLANT
ROD SPINAL 5.5X80 TI LORDOSE (Rod) ×4 IMPLANT
SCREW LOCK RELINE 5.5 TULIP (Screw) ×8 IMPLANT
SCREW RELINE PA 6.5X40 (Screw) ×8 IMPLANT
SPONGE SURGIFOAM ABS GEL SZ50 (HEMOSTASIS) ×2 IMPLANT
STRIP CLOSURE SKIN 1/2X4 (GAUZE/BANDAGES/DRESSINGS) ×1 IMPLANT
SUT VIC AB 2-0 CT1 18 (SUTURE) ×3 IMPLANT
SUT VIC AB 3-0 SH 8-18 (SUTURE) ×3 IMPLANT
TIP FENS REPLACE RELINE (MISCELLANEOUS) ×8 IMPLANT
TOWEL GREEN STERILE (TOWEL DISPOSABLE) ×3 IMPLANT
TOWEL GREEN STERILE FF (TOWEL DISPOSABLE) ×3 IMPLANT
TRAY FOLEY MTR SLVR 16FR STAT (SET/KITS/TRAYS/PACK) ×2 IMPLANT
WATER STERILE IRR 1000ML POUR (IV SOLUTION) ×3 IMPLANT

## 2019-02-15 NOTE — Op Note (Signed)
Date of procedure: 02/25/2019  Date of dictation: Same  Service: Neurosurgery  Preoperative diagnosis: T10-T11 Chance fracture with instability  Postoperative diagnosis: Same  Procedure Name: T10-T12 posterior lateral fusion with percutaneous nonsegmental pedicle screw fixation and methylmethacrylate augmentation  Surgeon:Patrisia Faeth A.Rhodesia Stanger, M.D.  Asst. Surgeon: Reinaldo Meeker, NP   The assistant was utilized during exposure fusion instrumentation and closure during the surgery.  Anesthesia: General  Indication: 78 year old female with multiple medical problems presents following a fall.  Patient with severe debilitating lower thoracic pain.  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  Patient presents now for short segment fusion and stabilization in hopes of improving her symptoms.  Operative note: After induction of anesthesia, patient position prone onto bolsters and appropriately padded.  Patient's lower thoracic region lumbar region prepped and draped sterilely.  Incision and made overlying the pedicles of T10 and T12 bilaterally utilizing fluoroscopic guidance.  A Jamshidi needle introducer was then passed into the pedicles of T10 and T12 again under fluoroscopic guidance in both the AP and lateral planes.  Once the needle introducer had been advanced into the vertebral body guidewires were placed.  The Jamshidi introducer was removed.  Each pedicle was then tapped with a screw tap over the retained wire.  6.5 mm fenestrated NuVasive percutaneous pedicle screws were then placed into the vertebral bodies of T10 and T12 bilaterally under fluoroscopic guidance.  The guidewires were removed.  Towers were inserted for methylmethacrylate application through the screws.  Methylmethacrylate was then instilled through the pedicle screws of T10 and T12 bilaterally under fluoroscopic guidance confirming good  positioning of the methylmethacrylate and lack of any extravasation.  The methylmethacrylate towers were removed.  A short segment of titanium rod was then contoured and placed through the towers from T10-T12.  Locking caps were then placed through the towers and secured.  Locking caps were given a final tightening.  The towers were removed.  Final images reveal good position of the hardware with vertebral augmentation at the proper operative levels with normal alignment of the spine.  Wounds were then irrigated.  Wounds were then closed in layers with Vicryl sutures.  Steri-Strips and sterile dressing were applied.  No apparent complications.  Patient tolerated procedure well and she returns to recovery room postop.

## 2019-02-15 NOTE — Progress Notes (Signed)
Patient off the unit for surgery.

## 2019-02-15 NOTE — Progress Notes (Addendum)
PROGRESS NOTE    Victoria Adams  [id number] DOB: February 17, 1941 DOA: 02/07/2019 PCP: Caryl Bis, MD   Brief Narrative:  Patient is a 78 year old female with history of hypertension, diabetes, systolic congestive heart failure, proximal A. Fib who presents with fall about 2 weeks ago, increased back pain since then.  Imaging on presentation showed worsening of compression fracture of thoracic spine concerning for unstable fracture.  Neurosurgery consulted.  Underwent  T10-T12 posterior lateral fusion with percutaneous nonsegmental pedicle screw fixation and methylmethacrylate augmentation.  Assessment & Plan:   Principal Problem:   Thoracic compression fracture [facility]) Active Problems:   Essential hypertension, benign   Atrial fibrillation (HCC)   Type 2 diabetes mellitus with complication (HCC)   Dyspnea   Back pain   T10-T11 thoracic compression fracture: Progression as compared to 2 weeks ago associated with severe back pain.  Neurosurgery has been  following. Underwent  T10-T12 posterior lateral fusion with percutaneous nonsegmental pedicle screw fixation and methylmethacrylate augmentation.  Complains of severe pain.  Continue pain management.  PT/OT consultation after neurosurgery clearance.   Bilateral lower extremity edema/history of systolic CHF: Last echo in clinic in with normal left ventricular dysfunction and normal right ventricular function, she might have left ventricular recovery with CRT device.  Denies any shortness of breath.  Has mild lower extremity bilateral edema.  She is on Lasix 80 mg twice a day at home, on spironolactone, on discharge.  She was hypotensive on presentation so Lasix was held.  Echocardiogram done on 10/29 showed ejection fraction of 60-65%.  Lasix 40 mg daily resumed.  She may not need Entresto or Aldactone because her ejection fraction is recovered.  She needs to follow-up with cardiology as an outpatient  Hypertension: Continue current  medicines.  Continue to monitor BP.  Hypertensive today  Paroxysmal A. fib: Rate is controlled.  Continue Coreg.  Eliquis on hold for surgical planning  Diabetes mellitus: Continue sliding scale  insulin.  CKD stage III: Currently kidney function is at baseline  Morbid obesity: BMI of 40         DVT prophylaxis: Heparin [place] Code Status:  Full Family Communication: Family member present at the bedside Disposition Plan: Needs PT evaluation    Consultants: Neurosurgery  Procedures: None  Antimicrobials:  Anti-infectives (From admission, onward)   Start     Dose/Rate Route Frequency Ordered Stop   02/15/19 1600  ceFAZolin (ANCEF) IVPB 2g/100 mL premix     2 g 200 mL/hr over 30 Minutes Intravenous Every 8 hours 02/15/19 1155 02/16/19 0759   02/15/19 0829  bacitracin 50,000 Units in sodium chloride 0.9 % 500 mL irrigation  Status:  Discontinued       As needed 02/15/19 0829 02/15/19 0941      Subjective: Patient seen and examined at bedside this afternoon.  Just came from surgery.  Complains of severe pain on the operated side.  Objective: Vitals:   02/15/19 1045 02/15/19 1054 02/15/19 1103 02/15/19 1130  BP: (!) 190/93 (!) 189/77 (!) 167/85 (!) 164/104  Pulse: 79 77 72 79  Resp: 17 (!) 21 15 18   Temp:    98.2 F (36.8 C)  TempSrc:    Oral  SpO2: 98% 98% 99%   Weight:      Height:        Intake/Output Summary (Last 24 hours) at 02/15/2019 1405 Last data filed at 02/15/2019 1117 Gross per 24 hour  Intake 525 ml  Output 2100 ml  Net -1575 ml  Filed Weights   02/10/19 0500 02/11/19 0500 02/12/19 0500  Weight: 117.7 kg 115.9 kg 115.4 kg    Examination:  General exam: In distress due to severe pain,morbidly obese Respiratory system: Bilateral equal air entry, normal vesicular breath sounds, no wheezes or crackles  Cardiovascular system: S1 & S2 heard, RRR. No JVD, murmurs, rubs, gallops or clicks. Gastrointestinal system: Abdomen is  soft and nontender.   Central nervous system: Alert and oriented. No focal neurological deficits. Extremities: No edema, no clubbing ,no cyanosis, distal peripheral pulses palpable. Skin: No rashes, lesions or ulcers,no icterus ,no pallor    Data Reviewed: I have personally reviewed following labs and imaging studies  CBC: Recent Labs  Lab 02/11/19 0415  WBC 4.6  HGB 12.5  HCT 38.5  MCV 93.4  PLT 0000000*   Basic Metabolic Panel: Recent Labs  Lab 02/11/19 0415 02/12/19 0502  NA 136 139  K 4.1 4.3  CL 100 103  CO2 25 27  GLUCOSE 149* 138*  BUN 27* 22  CREATININE 1.41* 1.32*  CALCIUM 8.7* 9.0   GFR: Estimated Creatinine Clearance: 47.6 mL/min (A) (by C-G formula based on SCr of 1.32 mg/dL (H)). Liver Function Tests: No results for input(s): AST, ALT, ALKPHOS, BILITOT, PROT, ALBUMIN in the last 168 hours. No results for input(s): LIPASE, AMYLASE in the last 168 hours. No results for input(s): AMMONIA in the last 168 hours. Coagulation Profile: No results for input(s): INR, PROTIME in the last 168 hours. Cardiac Enzymes: No results for input(s): CKTOTAL, CKMB, CKMBINDEX, TROPONINI in the last 168 hours. BNP (last 3 results) No results for input(s): PROBNP in the last 8760 hours. HbA1C: No results for input(s): HGBA1C in the last 72 hours. CBG: Recent Labs  Lab 02/14/19 1107 02/14/19 1614 02/14/19 2032 02/15/19 0736 02/15/19 0948  GLUCAP 188* 233* 190* 202* 263*   Lipid Profile: No results for input(s): CHOL, HDL, LDLCALC, TRIG, CHOLHDL, LDLDIRECT in the last 72 hours. Thyroid Function Tests: No results for input(s): TSH, T4TOTAL, FREET4, T3FREE, THYROIDAB in the last 72 hours. Anemia Panel: No results for input(s): VITAMINB12, FOLATE, FERRITIN, TIBC, IRON, RETICCTPCT in the last 72 hours. Sepsis Labs: No results for input(s): PROCALCITON, LATICACIDVEN in the last 168 hours.  Recent Results (from the past 240 hour(s))  SARS CORONAVIRUS 2 (TAT 6-24 HRS) Nasopharyngeal  Nasopharyngeal Swab     Status: None   Collection Time: 02/07/19  6:23 PM   Specimen: Nasopharyngeal Swab  Result Value Ref Range Status   SARS Coronavirus 2 NEGATIVE NEGATIVE Final    Comment: (NOTE) SARS-CoV-2 target nucleic acids are NOT DETECTED. The SARS-CoV-2 RNA is generally detectable in upper and lower respiratory specimens during the acute phase of infection. Negative results do not preclude SARS-CoV-2 infection, do not rule out co-infections with other pathogens, and should not be used as the sole basis for treatment or other patient management decisions. Negative results must be combined with clinical observations, patient history, and epidemiological information. The expected result is Negative. Fact Sheet for Patients: SugarRoll.be Fact Sheet for Healthcare Providers: https://www.woods-mathews.com/ This test is not yet approved or cleared by the [place] FDA and  has been authorized for detection and/or diagnosis of SARS-CoV-2 by FDA under an Emergency Use Authorization (EUA). This EUA will remain  in effect (meaning this test can be used) for the duration of the COVID-19 declaration under Section 56 4(b)(1) of the Act, 21 U.S.C. section 360bbb-3(b)(1), unless the authorization is terminated or revoked sooner. Performed at [facility]  Lab, [address], [place], [place] [address]          Radiology Studies: No results found.      Scheduled Meds: . carvedilol  12.5 mg Oral BID WC  . colchicine  0.6 mg Oral Daily  . docusate sodium  100 mg Oral BID  . ferrous sulfate  325 mg Oral Q breakfast  . furosemide  40 mg Oral Daily  . heparin injection (subcutaneous)  5,000 Units Subcutaneous Q8H  . HYDROmorphone      . insulin aspart      . insulin aspart  0-9 Units Subcutaneous TID WC  . labetalol      . pantoprazole  40 mg Oral Daily  . polyethylene glycol  17 g Oral Daily  . potassium chloride SA  20 mEq  Oral Daily  . promethazine      . rosuvastatin  20 mg Oral Daily  . sacubitril-valsartan  1 tablet Oral BID  . sertraline  100 mg Oral Daily  . sodium chloride flush  3 mL Intravenous Q12H  . spironolactone  12.5 mg Oral Daily   Continuous Infusions: . sodium chloride    . acetaminophen    .  ceFAZolin (ANCEF) IV       LOS: 8 days    Time spent: 25 mins.More than 50% of that time was spent in counseling and/or coordination of care.      Shelly Coss, MD [facility] Pager [phone]  If 7PM-7AM, please contact night-coverage [email or web address] Password [email or web address] 02/15/2019, 2:05 PM

## 2019-02-15 NOTE — Anesthesia Postprocedure Evaluation (Signed)
Anesthesia Post Note  Patient: Victoria Adams  Procedure(s) Performed: Percutaneous Pedicle Screw Fixation from Thoracic Ten-Thoracic Twelve with Methylmethacrylate Screw Augmentation (N/A Spine Thoracic)     Patient location during evaluation: PACU Anesthesia Type: General Level of consciousness: awake and alert and oriented Pain management: pain level controlled Vital Signs Assessment: post-procedure vital signs reviewed and stable Respiratory status: spontaneous breathing, nonlabored ventilation and respiratory function stable Cardiovascular status: blood pressure returned to baseline Postop Assessment: no apparent nausea or vomiting Anesthetic complications: no    Last Vitals:  Vitals:   02/15/19 1054 02/15/19 1103  BP: (!) 189/77 (!) 167/85  Pulse: 77 72  Resp: (!) 21 15  Temp:    SpO2: 98% 99%    Last Pain:  Vitals:   02/15/19 1103  TempSrc:   PainSc: [place] Lakenzie Mcclafferty

## 2019-02-15 NOTE — Plan of Care (Signed)
  Problem: Pain Managment: Goal: General experience of comfort will improve Outcome: Progressing   

## 2019-02-15 NOTE — Brief Op Note (Signed)
02/07/2019 - 02/15/2019  9:22 AM  PATIENT:  Victoria Adams  78 y.o. female  PRE-OPERATIVE DIAGNOSIS:  thoracic fracture  POST-OPERATIVE DIAGNOSIS:  thoracic fracture  PROCEDURE:  Procedure(s): Percutaneous Pedicle Screw Fixation from Thoracic Ten-Thoracic Twelve with Methylmethacrylate Screw Augmentation (N/A)  SURGEON:  Surgeon(s) and Role:    Earnie Larsson, MD - Primary  PHYSICIAN ASSISTANT:   ASSISTANTSMearl Latin   ANESTHESIA:   general  EBL:  50 mL   BLOOD ADMINISTERED:none  DRAINS: none   LOCAL MEDICATIONS USED:  MARCAINE     SPECIMEN:  No Specimen  DISPOSITION OF SPECIMEN:  N/A  COUNTS:  YES  TOURNIQUET:  * No tourniquets in log *  DICTATION: .Dragon Dictation  PLAN OF CARE: Admit to inpatient   PATIENT DISPOSITION:  PACU - hemodynamically stable.   Delay start of Pharmacological VTE agent (>24hrs) due to surgical blood loss or risk of bleeding: yes

## 2019-02-15 NOTE — Progress Notes (Signed)
Notified Dr. Tawanna Solo about pt's pain 10/10, even with IV pain medication. Per provider, pt can have additional 1mg  of IV dilaudid. Dr. Annette Stable rounded on pt at the same time and educated pt on pain control.

## 2019-02-15 NOTE — Progress Notes (Signed)
Pt's BP elevated upon arrival to PACU. RN contacted [facility] about pressure (188/82) and 10mg  Labetalol was ordered. Howze's stated goal is to get systolic BP </= [phone]. Will continue to monitor.

## 2019-02-15 NOTE — Transfer of Care (Signed)
Immediate Anesthesia Transfer of Care Note  Patient: Jana A Vanderbeck  Procedure(s) Performed: Percutaneous Pedicle Screw Fixation from Thoracic Ten-Thoracic Twelve with Methylmethacrylate Screw Augmentation (N/A Spine Thoracic)  Patient Location: PACU  Anesthesia Type:General  Level of Consciousness: awake, alert , oriented and patient cooperative  Airway & Oxygen Therapy: Patient Spontanous Breathing and Patient connected to nasal cannula oxygen  Post-op Assessment: Report given to RN and Post -op Vital signs reviewed and stable  Post vital signs: Reviewed and stable  Last Vitals:  Vitals Value Taken Time  BP 162/82 02/15/19 0946  Temp 36.1 C 02/15/19 0945  Pulse 74 02/15/19 0950  Resp 22 02/15/19 0950  SpO2 96 % 02/15/19 0950  Vitals shown include unvalidated device data.  Last Pain:  Vitals:   02/15/19 0447  TempSrc:   PainSc: Asleep      Patients Stated Pain Goal: 2 ([id number])  Complications: No apparent anesthesia complications

## 2019-02-16 ENCOUNTER — Inpatient Hospital Stay (HOSPITAL_COMMUNITY): Admission: RE | Admit: 2019-02-16 | Payer: Medicare Other | Source: Ambulatory Visit | Admitting: Internal Medicine

## 2019-02-16 ENCOUNTER — Encounter (HOSPITAL_COMMUNITY): Payer: Self-pay | Admitting: Neurosurgery

## 2019-02-16 LAB — CBC WITH DIFFERENTIAL/PLATELET
Abs Immature Granulocytes: 0.05 10*3/uL (ref 0.00–0.07)
Basophils Absolute: 0 10*3/uL (ref 0.0–0.1)
Basophils Relative: 0 %
Eosinophils Absolute: 0 10*3/uL (ref 0.0–0.5)
Eosinophils Relative: 0 %
HCT: 45 % (ref 36.0–46.0)
Hemoglobin: 14.2 g/dL (ref 12.0–15.0)
Immature Granulocytes: 1 %
Lymphocytes Relative: 6 %
Lymphs Abs: 0.6 10*3/uL — ABNORMAL LOW (ref 0.7–4.0)
MCH: 29.6 pg (ref 26.0–34.0)
MCHC: 31.6 g/dL (ref 30.0–36.0)
MCV: 93.9 fL (ref 80.0–100.0)
Monocytes Absolute: 1 10*3/uL (ref 0.1–1.0)
Monocytes Relative: 9 %
Neutro Abs: 8.7 10*3/uL — ABNORMAL HIGH (ref 1.7–7.7)
Neutrophils Relative %: 84 %
Platelets: 157 10*3/uL (ref 150–400)
RBC: 4.79 MIL/uL (ref 3.87–5.11)
RDW: 13.8 % (ref 11.5–15.5)
WBC: 10.3 10*3/uL (ref 4.0–10.5)
nRBC: 0 % (ref 0.0–0.2)

## 2019-02-16 LAB — GLUCOSE, CAPILLARY
Glucose-Capillary: 319 mg/dL — ABNORMAL HIGH (ref 70–99)
Glucose-Capillary: 295 mg/dL — ABNORMAL HIGH (ref 70–99)
Glucose-Capillary: 326 mg/dL — ABNORMAL HIGH (ref 70–99)
Glucose-Capillary: 360 mg/dL — ABNORMAL HIGH (ref 70–99)

## 2019-02-16 LAB — BASIC METABOLIC PANEL
Anion gap: 15 (ref 5–15)
BUN: 24 mg/dL — ABNORMAL HIGH (ref 8–23)
CO2: 26 mmol/L (ref 22–32)
Calcium: 8.9 mg/dL (ref 8.9–10.3)
Chloride: 93 mmol/L — ABNORMAL LOW (ref 98–111)
Creatinine, Ser: 1.44 mg/dL — ABNORMAL HIGH (ref 0.44–1.00)
GFR calc Af Amer: 41 mL/min — ABNORMAL LOW (ref >60)
GFR calc non Af Amer: 35 mL/min — ABNORMAL LOW (ref >60)
Glucose, Bld: 306 mg/dL — ABNORMAL HIGH (ref 70–99)
Potassium: 5.4 mmol/L — ABNORMAL HIGH (ref 3.5–5.1)
Sodium: 134 mmol/L — ABNORMAL LOW (ref 135–145)

## 2019-02-16 MED ORDER — FENTANYL 12 MCG/HR TD PT72
1.0000 | MEDICATED_PATCH | TRANSDERMAL | Status: DC
  Administered 2019-02-16 – 2019-02-19 (×2): 1 via TRANSDERMAL
  Filled 2019-02-16 (×2): qty 1

## 2019-02-16 MED ORDER — PROCHLORPERAZINE EDISYLATE 10 MG/2ML IJ SOLN
5.0000 mg | Freq: Once | INTRAMUSCULAR | Status: AC
  Administered 2019-02-16: 5 mg via INTRAVENOUS
  Filled 2019-02-16: qty 2

## 2019-02-16 NOTE — Plan of Care (Signed)
  Problem: Pain Managment: Goal: General experience of comfort will improve Outcome: Progressing   

## 2019-02-16 NOTE — Progress Notes (Signed)
Postop day 1.  Patient still bitterly complains of back pain.  Also complains of lower abdominal pain.  No lower extremity pain.  States legs do not feel numb or weak.  She is afebrile.  Her vital signs are stable.  She is breathing easily.  Dressings clean and dry.  Abdomen soft.  No rebound.  Pain does not appear to be dysesthetic or neuropathic.  Motor and sensory function of the lower extremities limited somewhat by pain but appears intact.  Progressing slowly following surgical stabilization.  Continue efforts at pain control.  Patient may be mobilized out of bed.  She may be mobilized without brace.

## 2019-02-16 NOTE — Progress Notes (Signed)
PROGRESS NOTE    Victoria Adams  [id number] DOB: 1940/12/13 DOA: 02/07/2019 PCP: Caryl Bis, MD   Brief Narrative:  Patient is a 78 year old female with history of hypertension, diabetes, systolic congestive heart failure, proximal A. Fib who presents with fall about 2 weeks ago, increased back pain since then.  Imaging on presentation showed worsening of compression fracture of thoracic spine concerning for unstable fracture.  Neurosurgery consulted.  Underwent  T10-T12 posterior lateral fusion with percutaneous nonsegmental pedicle screw fixation and methylmethacrylate augmentation.POD1 .  Continues to complain of back pain.  PT/OT evaluating her today.  Assessment & Plan:   Principal Problem:   Thoracic compression fracture [facility]) Active Problems:   Essential hypertension, benign   Atrial fibrillation (HCC)   Type 2 diabetes mellitus with complication (HCC)   Dyspnea   Back pain   T10-T11 thoracic compression fracture: Presented with severe back pain.  Neurosurgery has been  following. Underwent  T10-T12 posterior lateral fusion with percutaneous nonsegmental pedicle screw fixation and methylmethacrylate augmentation.  Complains of severe pain.  Continue pain management.  Added fentanyl patch.PT/OT consultation done.  Bilateral lower extremity edema/history of systolic CHF: Last echo in clinic in with normal left ventricular dysfunction and normal right ventricular function, she might have left ventricular recovery with CRT device.  Denies any shortness of breath.  Has mild lower extremity bilateral edema.  She is on Lasix 80 mg twice a day at home, on spironolactone, on discharge.  She was hypotensive on presentation so Lasix was held.  Echocardiogram done on 10/29 showed ejection fraction of 60-65%.  Lasix 40 mg daily resumed.  She may not need Entresto or Aldactone because her ejection fraction is recovered.  She needs to follow-up with cardiology as an outpatient   Hypertension: Continue current medicines.  Continue to monitor BP.  Hypertensive today most likely secondary to pain.  Paroxysmal A. fib: Rate is controlled.  Continue Coreg.  Eliquis on hold for surgical planning  Diabetes mellitus: Continue sliding scale  insulin.  CKD stage III: Currently kidney function is at baseline  Morbid obesity: BMI of 40         DVT prophylaxis: Heparin [place] Code Status:  Full Family Communication: None  present at the bedside Disposition Plan: Home with home health when pain is better     Consultants: Neurosurgery  Procedures: None  Antimicrobials:  Anti-infectives (From admission, onward)   Start     Dose/Rate Route Frequency Ordered Stop   02/15/19 1600  ceFAZolin (ANCEF) IVPB 2g/100 mL premix     2 g 200 mL/hr over 30 Minutes Intravenous Every 8 hours 02/15/19 1155 02/15/19 2342   02/15/19 0829  bacitracin 50,000 Units in sodium chloride 0.9 % 500 mL irrigation  Status:  Discontinued       As needed 02/15/19 0829 02/15/19 0941      Subjective: Patient seen and examined the bedside this morning.  Continues to complain of severe back pain.  No other issues.  Participated with physical therapy.  Objective: Vitals:   02/16/19 0115 02/16/19 0353 02/16/19 0833 02/16/19 1255  BP:  (!) 157/81 (!) 176/83   Pulse:  93 98   Resp:  15 20   Temp:  98.2 F (36.8 C) 98.5 F (36.9 C)   TempSrc:  Oral Oral   SpO2: 97% 93% 100% 93%  Weight:      Height:       No intake or output data in the 24 hours ending 02/16/19 1327  Filed Weights   02/10/19 0500 02/11/19 0500 02/12/19 0500  Weight: 117.7 kg 115.9 kg 115.4 kg    Examination:  General exam: In distress due to severe pain,morbidly obese Respiratory system: Bilateral equal air entry, normal vesicular breath sounds, no wheezes or crackles  Cardiovascular system: S1 & S2 heard, RRR. No JVD, murmurs, rubs, gallops or clicks. Gastrointestinal system: Abdomen is  soft and nontender.  Central  nervous system: Alert and oriented. No focal neurological deficits. Extremities: No edema, no clubbing ,no cyanosis, distal peripheral pulses palpable. Skin: No rashes, lesions or ulcers,no icterus ,no pallor MSK: Dressings overlying the surgical wound on the back   Data Reviewed: I have personally reviewed following labs and imaging studies  CBC: Recent Labs  Lab 02/11/19 0415 02/16/19 0428  WBC 4.6 10.3  NEUTROABS  --  8.7*  HGB 12.5 14.2  HCT 38.5 45.0  MCV 93.4 93.9  PLT 138* [id number]   Basic Metabolic Panel: Recent Labs  Lab 02/11/19 0415 02/12/19 0502 02/16/19 0428  NA 136 139 134*  K 4.1 4.3 5.4*  CL 100 103 93*  CO2 25 27 26   GLUCOSE 149* 138* 306*  BUN 27* 22 24*  CREATININE 1.41* 1.32* 1.44*  CALCIUM 8.7* 9.0 8.9   GFR: Estimated Creatinine Clearance: 43.6 mL/min (A) (by C-G formula based on SCr of 1.44 mg/dL (H)). Liver Function Tests: No results for input(s): AST, ALT, ALKPHOS, BILITOT, PROT, ALBUMIN in the last 168 hours. No results for input(s): LIPASE, AMYLASE in the last 168 hours. No results for input(s): AMMONIA in the last 168 hours. Coagulation Profile: No results for input(s): INR, PROTIME in the last 168 hours. Cardiac Enzymes: No results for input(s): CKTOTAL, CKMB, CKMBINDEX, TROPONINI in the last 168 hours. BNP (last 3 results) No results for input(s): PROBNP in the last 8760 hours. HbA1C: No results for input(s): HGBA1C in the last 72 hours. CBG: Recent Labs  Lab 02/15/19 1134 02/15/19 1633 02/15/19 2111 02/16/19 0632 02/16/19 1138  GLUCAP 327* 344* 282* 319* 295*   Lipid Profile: No results for input(s): CHOL, HDL, LDLCALC, TRIG, CHOLHDL, LDLDIRECT in the last 72 hours. Thyroid Function Tests: No results for input(s): TSH, T4TOTAL, FREET4, T3FREE, THYROIDAB in the last 72 hours. Anemia Panel: No results for input(s): VITAMINB12, FOLATE, FERRITIN, TIBC, IRON, RETICCTPCT in the last 72 hours. Sepsis Labs: No results for input(s):  PROCALCITON, LATICACIDVEN in the last 168 hours.  Recent Results (from the past 240 hour(s))  SARS CORONAVIRUS 2 (TAT 6-24 HRS) Nasopharyngeal Nasopharyngeal Swab     Status: None   Collection Time: 02/07/19  6:23 PM   Specimen: Nasopharyngeal Swab  Result Value Ref Range Status   SARS Coronavirus 2 NEGATIVE NEGATIVE Final    Comment: (NOTE) SARS-CoV-2 target nucleic acids are NOT DETECTED. The SARS-CoV-2 RNA is generally detectable in upper and lower respiratory specimens during the acute phase of infection. Negative results do not preclude SARS-CoV-2 infection, do not rule out co-infections with other pathogens, and should not be used as the sole basis for treatment or other patient management decisions. Negative results must be combined with clinical observations, patient history, and epidemiological information. The expected result is Negative. Fact Sheet for Patients: SugarRoll.be Fact Sheet for Healthcare Providers: https://www.woods-mathews.com/ This test is not yet approved or cleared by the [place] FDA and  has been authorized for detection and/or diagnosis of SARS-CoV-2 by FDA under an Emergency Use Authorization (EUA). This EUA will remain  in effect (meaning this test can be used)  for the duration of the COVID-19 declaration under Section 56 4(b)(1) of the Act, 21 U.S.C. section 360bbb-3(b)(1), unless the authorization is terminated or revoked sooner. Performed at [facility], [place] [address], [place], [place] [address]          Radiology Studies: Dg Thoracic Spine 2 View  Result Date: 02/15/2019 CLINICAL DATA:  T10-T12 fusion. EXAM: THORACIC SPINE 2 VIEWS; DG C-ARM 1-60 MIN COMPARISON:  CT thoracic spine dated February 08, 2019. FLUOROSCOPY TIME:  2 minutes, 11 seconds C-arm fluoroscopic images were obtained intraoperatively and submitted for post operative interpretation. FINDINGS: Frontal and lateral  intraoperative fluoroscopic images demonstrate interval T10-T12 posterior fusion with pedicle screws at T10 and T12. There is a small amount of cement surrounding the pedicle screws within the vertebral bodies. IMPRESSION: 1. Intraoperative fluoroscopic guidance for T10-T12 posterior fusion. Electronically Signed   By: Titus Dubin M.D.   On: 02/15/2019 14:22   Dg C-arm 1-60 Min  Result Date: 02/15/2019 CLINICAL DATA:  T10-T12 fusion. EXAM: THORACIC SPINE 2 VIEWS; DG C-ARM 1-60 MIN COMPARISON:  CT thoracic spine dated February 08, 2019. FLUOROSCOPY TIME:  2 minutes, 11 seconds C-arm fluoroscopic images were obtained intraoperatively and submitted for post operative interpretation. FINDINGS: Frontal and lateral intraoperative fluoroscopic images demonstrate interval T10-T12 posterior fusion with pedicle screws at T10 and T12. There is a small amount of cement surrounding the pedicle screws within the vertebral bodies. IMPRESSION: 1. Intraoperative fluoroscopic guidance for T10-T12 posterior fusion. Electronically Signed   By: Titus Dubin M.D.   On: 02/15/2019 14:22        Scheduled Meds: . carvedilol  25 mg Oral BID WC  . colchicine  0.6 mg Oral Daily  . docusate sodium  100 mg Oral BID  . fentaNYL  1 patch Transdermal Q72H  . ferrous sulfate  325 mg Oral Q breakfast  . furosemide  40 mg Oral Daily  . heparin injection (subcutaneous)  5,000 Units Subcutaneous Q8H  . insulin aspart  0-9 Units Subcutaneous TID WC  . pantoprazole  40 mg Oral Daily  . polyethylene glycol  17 g Oral Daily  . rosuvastatin  20 mg Oral Daily  . sertraline  100 mg Oral Daily  . sodium chloride flush  3 mL Intravenous Q12H   Continuous Infusions: . sodium chloride       LOS: 9 days    Time spent: 25 mins.More than 50% of that time was spent in counseling and/or coordination of care.      Shelly Coss, MD [facility] Pager [phone]  If 7PM-7AM, please contact night-coverage  [email or web address] Password [email or web address] 02/16/2019, 1:27 PM

## 2019-02-16 NOTE — Evaluation (Signed)
Physical Therapy Evaluation Patient Details Name: Victoria Adams MRN: [id number] DOB: 1941-03-02 Today's Date: 02/16/2019   History of Present Illness  78 yo admitted after fall with back pain with compression fx s/p T10-T12 PLIF. PMhx: DM, AFib, CKD, HTN, CHF  Clinical Impression  Pt supine on arrival with [facility] elevated complaining of back pain, discomfort and wanting to toilet. Pt does not recall IV medication this morning and educated for receiving it and RN contacted for medication. Pt willing to mobilize and able to transfer to [facility] and chair but declined further gait. Pt refused option of brace and pt and daughter educated for all precautions with handout provided and mobility encouraged. Pt with decreased strength, gait, transfers, function and mobility who will benefit from acute therapy to maximize mobility safety and function.      Follow Up Recommendations Home health PT;Supervision/Assistance - 24 hour    Equipment Recommendations  3in1 (PT)    Recommendations for Other Services OT consult     Precautions / Restrictions Precautions Precautions: Back Precaution Booklet Issued: Yes (comment) Precaution Comments: no brace required Required Braces or Orthoses: Spinal Brace Spinal Brace: Thoracolumbosacral orthotic      Mobility  Bed Mobility Overal bed mobility: Needs Assistance Bed Mobility: Rolling;Sidelying to Sit Rolling: Min assist Sidelying to sit: Min assist;HOB elevated       General bed mobility comments: HOB 20 degrees with pt unable to tolerate flat. Pt with assist to bend knee and reach for rail with assist of pad to roll and physical assist to elevate trunk from surface  Transfers Overall transfer level: Needs assistance   Transfers: Sit to/from Stand;Stand Pivot Transfers Sit to Stand: Min assist Stand pivot transfers: Min assist       General transfer comment: min assist to rise from surface with single UE assist to pivot from bed to [facility], cues for  hand placement with guarding to rise from [facility] with max assist for pericare.  Ambulation/Gait Ambulation/Gait assistance: Min assist Gait Distance (Feet): 3 Feet Assistive device: Rolling walker (2 wheeled) Gait Pattern/deviations: Trunk flexed;Shuffle   Gait velocity interpretation: 1.31 - 2.62 ft/sec, indicative of limited community ambulator General Gait Details: cues for posture with assist to direct and move RW with pt only able to tolerate a few feet of gait limited by pain  Stairs            Wheelchair Mobility    Modified Rankin (Stroke Patients Only)       Balance Overall balance assessment: Needs assistance Sitting-balance support: No upper extremity supported;Feet supported Sitting balance-Leahy Scale: Fair     Standing balance support: Single extremity supported Standing balance-Leahy Scale: Poor                               Pertinent Vitals/Pain Pain Assessment: 0-10 Pain Score: 9  Pain Location: back and abdomen Pain Descriptors / Indicators: Aching;Constant;Pressure Pain Intervention(s): Limited activity within patient's tolerance;Monitored during session;Repositioned;Patient requesting pain meds-RN notified    Home Living Family/patient expects to be discharged to:: Private residence Living Arrangements: Children Available Help at Discharge: Family;Available 24 hours/day Type of Home: House Home Access: Stairs to enter   [employer] of Steps: 6 Home Layout: One level Home Equipment: Walker - 2 wheels;Walker - 4 wheels;Shower seat;Cane - single point      Prior Function Level of Independence: Independent  Hand Dominance        Extremity/Trunk Assessment   Upper Extremity Assessment Upper Extremity Assessment: Generalized weakness    Lower Extremity Assessment Lower Extremity Assessment: Generalized weakness    Cervical / Trunk Assessment Cervical / Trunk Assessment: Other  exceptions Cervical / Trunk Exceptions: post surgical guarding  Communication   Communication: No difficulties  Cognition Arousal/Alertness: Awake/alert Behavior During Therapy: WFL for tasks assessed/performed Overall Cognitive Status: Within Functional Limits for tasks assessed                                        General Comments      Exercises     Assessment/Plan    PT Assessment Patient needs continued PT services  PT Problem List Decreased strength;Decreased mobility;Decreased activity tolerance;Decreased balance;Decreased knowledge of use of DME;Pain       PT Treatment Interventions DME instruction;Therapeutic exercise;Gait training;Balance training;Stair training;Functional mobility training;Cognitive remediation;Therapeutic activities;Patient/family education    PT Goals (Current goals can be found in the Care Plan section)  Acute Rehab PT Goals Patient Stated Goal: return home and to [place] PT Goal Formulation: With patient/family Time For Goal Achievement: 03/02/19 Potential to Achieve Goals: Fair    Frequency Min 4X/week   Barriers to discharge        Co-evaluation               AM-PAC PT "6 Clicks" Mobility  Outcome Measure Help needed turning from your back to your side while in a flat bed without using bedrails?: A Little Help needed moving from lying on your back to sitting on the side of a flat bed without using bedrails?: A Little Help needed moving to and from a bed to a chair (including a wheelchair)?: A Little Help needed standing up from a chair using your arms (e.g., wheelchair or bedside chair)?: A Little Help needed to walk in hospital room?: A Lot Help needed climbing 3-5 steps with a railing? : A Lot 6 Click Score: 16    End of Session   Activity Tolerance: Patient tolerated treatment well Patient left: with call bell/phone within reach;in chair;with nursing/sitter in room;with family/visitor present Nurse  Communication: Mobility status;Precautions PT Visit Diagnosis: Other abnormalities of gait and mobility (R26.89);Muscle weakness (generalized) (M62.81);Difficulty in walking, not elsewhere classified (R26.2)    Time: [id number] PT Time Calculation (min) (ACUTE ONLY): 25 min   Charges:   PT Evaluation $PT Eval Moderate Complexity: 1 Mod PT Treatments $Therapeutic Activity: 8-22 mins        Mahari Vankirk P, PT Acute Rehabilitation Services Pager: [phone] Office: [phone]   Roben Tatsch B Masson Nalepa 02/16/2019, 1:03 PM

## 2019-02-16 NOTE — TOC Progression Note (Signed)
Transition of Care [facility]) - Progression Note    Patient Details  Name: Victoria Adams MRN: [id number] Date of Birth: 1941/01/01  Transition of Care [facility]) CM/SW [place] RN, BSN, NCM-BC, [place] [phone] Phone Number: 02/16/2019, 4:21 PM  Clinical Narrative:    CM following for dispositional needs. CM spoke to the patients daughter, Victoria Adams (patient in the room) to discuss the POC. Patient s/p T10-T12 PLIF with PT eval completed and HHPT recommended with 24hr/supervision and a BSC with the patient/daughter agreeable. CMS HH Compare list provided with [facility] selected. [place] referral given to Adela Lank RN (liaison); AVS updated.DME referral given to [place] ([place] liaison); DME will be delivered to the patients room prior to DC. Daughter states that she will be available to provide24hr/supervision  post-discharge. CM team will continue to follow.    Expected Discharge Plan: [place] Barriers to Discharge: Continued Medical Work up  Expected Discharge Plan and Services Expected Discharge Plan: [place] In-house Referral: NA Discharge Planning Services: CM Consult Post Acute Care Choice: Home Health, Durable Medical Equipment Living arrangements for the past 2 months: Single Family Home                 DME Arranged: Bedside commode DME Agency: [employer] Date DME Agency Contacted: 02/16/19 Time DME Agency Contacted: 1620 Representative spoke with at DME Agency: Bertrum Sol (liaison) [place] Arranged: PT [place]: [place] Date [place]: 02/16/19 Time [place]: 1620 Representative spoke with at [place]: [place] (liaison)   Social Determinants of Health (SDOH) Interventions    Readmission Risk Interventions No flowsheet data found.

## 2019-02-17 ENCOUNTER — Inpatient Hospital Stay (HOSPITAL_COMMUNITY): Payer: Medicare Other

## 2019-02-17 LAB — BASIC METABOLIC PANEL
Anion gap: 11 (ref 5–15)
BUN: 23 mg/dL (ref 8–23)
CO2: 25 mmol/L (ref 22–32)
Calcium: 9 mg/dL (ref 8.9–10.3)
Chloride: 96 mmol/L — ABNORMAL LOW (ref 98–111)
Creatinine, Ser: 1.14 mg/dL — ABNORMAL HIGH (ref 0.44–1.00)
GFR calc Af Amer: 54 mL/min — ABNORMAL LOW (ref >60)
GFR calc non Af Amer: 47 mL/min — ABNORMAL LOW (ref >60)
Glucose, Bld: 312 mg/dL — ABNORMAL HIGH (ref 70–99)
Potassium: 4.5 mmol/L (ref 3.5–5.1)
Sodium: 132 mmol/L — ABNORMAL LOW (ref 135–145)

## 2019-02-17 LAB — GLUCOSE, CAPILLARY
Glucose-Capillary: 312 mg/dL — ABNORMAL HIGH (ref 70–99)
Glucose-Capillary: 315 mg/dL — ABNORMAL HIGH (ref 70–99)
Glucose-Capillary: 266 mg/dL — ABNORMAL HIGH (ref 70–99)
Glucose-Capillary: 290 mg/dL — ABNORMAL HIGH (ref 70–99)

## 2019-02-17 MED ORDER — SENNOSIDES-DOCUSATE SODIUM 8.6-50 MG PO TABS
2.0000 | ORAL_TABLET | Freq: Two times a day (BID) | ORAL | Status: DC
  Administered 2019-02-17 – 2019-02-21 (×7): 2 via ORAL
  Filled 2019-02-17 (×8): qty 2

## 2019-02-17 MED ORDER — BISACODYL 10 MG RE SUPP
10.0000 mg | Freq: Once | RECTAL | Status: AC
  Administered 2019-02-17: 10 mg via RECTAL
  Filled 2019-02-17: qty 1

## 2019-02-17 MED ORDER — INSULIN GLARGINE 100 UNIT/ML ~~LOC~~ SOLN
30.0000 [IU] | Freq: Every day | SUBCUTANEOUS | Status: DC
  Administered 2019-02-17 – 2019-02-20 (×4): 30 [IU] via SUBCUTANEOUS
  Filled 2019-02-17 (×4): qty 0.3

## 2019-02-17 MED ORDER — BISACODYL 10 MG RE SUPP
10.0000 mg | Freq: Once | RECTAL | Status: DC
  Filled 2019-02-17 (×2): qty 1

## 2019-02-17 MED ORDER — DICYCLOMINE HCL 20 MG PO TABS
20.0000 mg | ORAL_TABLET | Freq: Three times a day (TID) | ORAL | Status: AC
  Administered 2019-02-17 (×3): 20 mg via ORAL
  Filled 2019-02-17 (×4): qty 1

## 2019-02-17 NOTE — Progress Notes (Signed)
Patient ID: Victoria Adams, female   DOB: 1940/04/21, 78 y.o.   MRN: [id number] Patient doing well condition of back pain and abdominal pain but she is passing gas  Strength 5 out of 5 wound clean dry and intact  Postop day 2 thoracolumbar stabilization with some abdominal pain and bloating patient is passing gas but may have some degree of ileus recommend aggressive bowel regimen and mobilization with physical therapy.

## 2019-02-17 NOTE — Progress Notes (Signed)
OT Cancellation Note  Patient Details Name: Victoria Adams MRN: [id number] DOB: [birth year]   Cancelled Treatment:    Reason Eval/Treat Not Completed: Pain limiting ability to participate attempt twice   Joeseph Amor 02/17/2019, 1:56 PM

## 2019-02-17 NOTE — Plan of Care (Signed)
Consistently rating pain 10/10. PRN pain med given exactly Q3.   Problem: Education: Goal: Knowledge of General Education information will improve Description: Including pain rating scale, medication(s)/side effects and non-pharmacologic comfort measures Outcome: Progressing   Problem: Activity: Goal: Risk for activity intolerance will decrease Outcome: Progressing   Problem: Pain Managment: Goal: General experience of comfort will improve Outcome: Progressing   Problem: Safety: Goal: Ability to remain free from injury will improve Outcome: Progressing

## 2019-02-17 NOTE — Progress Notes (Signed)
PROGRESS NOTE    Victoria Adams  [id number] DOB: Jun 03, 1940 DOA: 02/07/2019 PCP: Caryl Bis, MD   Brief Narrative:  Patient is a 78 year old female with history of hypertension, diabetes, systolic congestive heart failure, proximal A. Fib who presents with fall about 2 weeks ago, increased back pain since then.  Imaging on presentation showed worsening of compression fracture of thoracic spine concerning for unstable fracture.  Neurosurgery consulted.  Underwent  T10-T12 posterior lateral fusion with percutaneous nonsegmental pedicle screw fixation and methylmethacrylate augmentation.POD2 .  PT/OT recommended home health.  Back pain better but  still significantly present.  Assessment & Plan:   Principal Problem:   Thoracic compression fracture [facility]) Active Problems:   Essential hypertension, benign   Atrial fibrillation (HCC)   Type 2 diabetes mellitus with complication (HCC)   Dyspnea   Back pain   T10-T11 thoracic compression fracture: Presented with severe back pain.  Neurosurgery has been  following. Underwent  T10-T12 posterior lateral fusion with percutaneous nonsegmental pedicle screw fixation and methylmethacrylate augmentation.    Continue pain management.  Added fentanyl patch.PT/OT consultation done.Recommended hHPT  Abdominal pain: Continues to complain of abdominal bloating, discomfort.  Will check abdominal x-ray.  Most likely postoperative ileus.  We did encourage ambulation, continue aggressive bowel regimen.  Bilateral lower extremity edema/history of systolic CHF: Last echo in clinic in with normal left ventricular dysfunction and normal right ventricular function, she might have left ventricular recovery with CRT device.  Denies any shortness of breath.  Has mild lower extremity bilateral edema.  She is on Lasix 80 mg twice a day at home, on spironolactone, on discharge.  She was hypotensive on presentation so Lasix was held.  Echocardiogram done on 10/29  showed ejection fraction of 60-65%.  Lasix 40 mg daily resumed.  She may not need Entresto or Aldactone because her ejection fraction is recovered.  She needs to follow-up with cardiology as an outpatient  Hypertension: Continue current medicines.  Continue to monitor BP.  Hypertensive today most likely secondary to pain.  Paroxysmal A. fib: Rate is controlled.  Continue Coreg.  Eliquis on hold for surgical planning  Diabetes mellitus: Hyperglycemic today.Continue lantus and sliding scale  insulin.  CKD stage III: Currently kidney function is at baseline.Will check BMP today  Morbid obesity: BMI of 40         DVT prophylaxis: Heparin [place] Code Status:  Full Family Communication: None  present at the bedside Disposition Plan: Home with home health when pain is better     Consultants: Neurosurgery  Procedures: None  Antimicrobials:  Anti-infectives (From admission, onward)   Start     Dose/Rate Route Frequency Ordered Stop   02/15/19 1600  ceFAZolin (ANCEF) IVPB 2g/100 mL premix     2 g 200 mL/hr over 30 Minutes Intravenous Every 8 hours 02/15/19 1155 02/15/19 2342   02/15/19 0829  bacitracin 50,000 Units in sodium chloride 0.9 % 500 mL irrigation  Status:  Discontinued       As needed 02/15/19 0829 02/15/19 0941      Subjective: Patient seen and examined the bedside this morning.  Hemodynamically stable.  Back pain better but now  complains of abdominal discomfort, bloating and pain.  Objective: Vitals:   02/16/19 1957 02/17/19 0055 02/17/19 0246 02/17/19 0451  BP: (!) 141/72 (!) 158/66 (!) 160/75   Pulse: 79 86 92   Resp: 18 17 18    Temp: 98.8 F (37.1 C) 98.5 F (36.9 C)    TempSrc:  Oral Oral    SpO2: 92% 95% 93%   Weight:    116.6 kg  Height:        Intake/Output Summary (Last 24 hours) at 02/17/2019 1046 Last data filed at 02/17/2019 0900 Gross per 24 hour  Intake 360 ml  Output -  Net 360 ml   Filed Weights   02/11/19 0500 02/12/19 0500 02/17/19 0451   Weight: 115.9 kg 115.4 kg 116.6 kg    Examination:  General exam: Morbidly obese, uncomfortable due to abdominal pain Respiratory system: Bilateral equal air entry, normal vesicular breath sounds, no wheezes or crackles  Cardiovascular system: S1 & S2 heard, RRR. No JVD, murmurs, rubs, gallops or clicks. Gastrointestinal system: Abdomen is  soft ,mildly distended,NT.  Central nervous system: Alert and oriented. No focal neurological deficits. Extremities: No edema, no clubbing ,no cyanosis, distal peripheral pulses palpable. Skin: No rashes, lesions or ulcers,no icterus ,no pallor MSK: Dressings overlying the surgical wound on the back   Data Reviewed: I have personally reviewed following labs and imaging studies  CBC: Recent Labs  Lab 02/11/19 0415 02/16/19 0428  WBC 4.6 10.3  NEUTROABS  --  8.7*  HGB 12.5 14.2  HCT 38.5 45.0  MCV 93.4 93.9  PLT 138* [id number]   Basic Metabolic Panel: Recent Labs  Lab 02/11/19 0415 02/12/19 0502 02/16/19 0428  NA 136 139 134*  K 4.1 4.3 5.4*  CL 100 103 93*  CO2 25 27 26   GLUCOSE 149* 138* 306*  BUN 27* 22 24*  CREATININE 1.41* 1.32* 1.44*  CALCIUM 8.7* 9.0 8.9   GFR: Estimated Creatinine Clearance: 43.9 mL/min (A) (by C-G formula based on SCr of 1.44 mg/dL (H)). Liver Function Tests: No results for input(s): AST, ALT, ALKPHOS, BILITOT, PROT, ALBUMIN in the last 168 hours. No results for input(s): LIPASE, AMYLASE in the last 168 hours. No results for input(s): AMMONIA in the last 168 hours. Coagulation Profile: No results for input(s): INR, PROTIME in the last 168 hours. Cardiac Enzymes: No results for input(s): CKTOTAL, CKMB, CKMBINDEX, TROPONINI in the last 168 hours. BNP (last 3 results) No results for input(s): PROBNP in the last 8760 hours. HbA1C: No results for input(s): HGBA1C in the last 72 hours. CBG: Recent Labs  Lab 02/16/19 0632 02/16/19 1138 02/16/19 1613 02/16/19 2149 02/17/19 0638  GLUCAP 319* 295* 326*  360* 312*   Lipid Profile: No results for input(s): CHOL, HDL, LDLCALC, TRIG, CHOLHDL, LDLDIRECT in the last 72 hours. Thyroid Function Tests: No results for input(s): TSH, T4TOTAL, FREET4, T3FREE, THYROIDAB in the last 72 hours. Anemia Panel: No results for input(s): VITAMINB12, FOLATE, FERRITIN, TIBC, IRON, RETICCTPCT in the last 72 hours. Sepsis Labs: No results for input(s): PROCALCITON, LATICACIDVEN in the last 168 hours.  Recent Results (from the past 240 hour(s))  SARS CORONAVIRUS 2 (TAT 6-24 HRS) Nasopharyngeal Nasopharyngeal Swab     Status: None   Collection Time: 02/07/19  6:23 PM   Specimen: Nasopharyngeal Swab  Result Value Ref Range Status   SARS Coronavirus 2 NEGATIVE NEGATIVE Final    Comment: (NOTE) SARS-CoV-2 target nucleic acids are NOT DETECTED. The SARS-CoV-2 RNA is generally detectable in upper and lower respiratory specimens during the acute phase of infection. Negative results do not preclude SARS-CoV-2 infection, do not rule out co-infections with other pathogens, and should not be used as the sole basis for treatment or other patient management decisions. Negative results must be combined with clinical observations, patient history, and epidemiological information. The expected result is  Negative. Fact Sheet for Patients: SugarRoll.be Fact Sheet for Healthcare Providers: https://www.woods-mathews.com/ This test is not yet approved or cleared by the [place] FDA and  has been authorized for detection and/or diagnosis of SARS-CoV-2 by FDA under an Emergency Use Authorization (EUA). This EUA will remain  in effect (meaning this test can be used) for the duration of the COVID-19 declaration under Section 56 4(b)(1) of the Act, 21 U.S.C. section 360bbb-3(b)(1), unless the authorization is terminated or revoked sooner. Performed at [facility], [place] [address], [place], [place] [address]           Radiology Studies: No results found.      Scheduled Meds: . bisacodyl  10 mg Rectal Once  . carvedilol  25 mg Oral BID WC  . colchicine  0.6 mg Oral Daily  . dicyclomine  20 mg Oral TID AC & HS  . docusate sodium  100 mg Oral BID  . fentaNYL  1 patch Transdermal Q72H  . ferrous sulfate  325 mg Oral Q breakfast  . furosemide  40 mg Oral Daily  . heparin injection (subcutaneous)  5,000 Units Subcutaneous Q8H  . insulin aspart  0-9 Units Subcutaneous TID WC  . pantoprazole  40 mg Oral Daily  . polyethylene glycol  17 g Oral Daily  . rosuvastatin  20 mg Oral Daily  . senna-docusate  2 tablet Oral BID  . sertraline  100 mg Oral Daily  . sodium chloride flush  3 mL Intravenous Q12H   Continuous Infusions: . sodium chloride       LOS: 10 days    Time spent: 25 mins.More than 50% of that time was spent in counseling and/or coordination of care.      Shelly Coss, MD [facility] Pager [phone]  If 7PM-7AM, please contact night-coverage [email or web address] Password [place] 02/17/2019, 10:46 AM

## 2019-02-17 NOTE — Progress Notes (Signed)
PT Cancellation Note  Patient Details Name: Victoria Adams MRN: [id number] DOB: 1941/04/01   Cancelled Treatment:    Reason Eval/Treat Not Completed: Patient declined, no reason specified. Pt reported that she had just returned from ambulating to the bathroom and back to bed with family. Pt declining participation in therapy session at this time. PT will continue to follow up with pt acutely as available.    [place] 02/17/2019, 3:05 PM

## 2019-02-18 LAB — GLUCOSE, CAPILLARY
Glucose-Capillary: 282 mg/dL — ABNORMAL HIGH (ref 70–99)
Glucose-Capillary: 243 mg/dL — ABNORMAL HIGH (ref 70–99)
Glucose-Capillary: 290 mg/dL — ABNORMAL HIGH (ref 70–99)
Glucose-Capillary: 201 mg/dL — ABNORMAL HIGH (ref 70–99)

## 2019-02-18 MED ORDER — LIDOCAINE 5 % EX PTCH
1.0000 | MEDICATED_PATCH | CUTANEOUS | Status: DC
  Administered 2019-02-18 – 2019-02-21 (×4): 1 via TRANSDERMAL
  Filled 2019-02-18 (×4): qty 1

## 2019-02-18 MED ORDER — FLEET ENEMA 7-19 GM/118ML RE ENEM
1.0000 | ENEMA | Freq: Once | RECTAL | Status: DC
  Filled 2019-02-18 (×2): qty 1

## 2019-02-18 NOTE — Progress Notes (Signed)
Physical Therapy Treatment Patient Details Name: Victoria Adams MRN: [id number] DOB: Jun 20, 1940 Today's Date: 02/18/2019    History of Present Illness 78 yo admitted after fall with back pain with compression fx s/p T10-T12 PLIF. PMhx: DM, AFib, CKD, HTN, CHF    PT Comments    Pt in tears due to severe pain due to constipation and post surgical pain. Unable to properly fit TLSO brace today limiting ambulation. Spoke with PT who will see pt tomorrow who will call brace supplier. Pt transferring minA despite pain and was able to march in place x 20, x2 trials. PT to cont to follow and progress ambulation as able.    Follow Up Recommendations  Home health PT;Supervision/Assistance - 24 hour     Equipment Recommendations  3in1 (PT)    Recommendations for Other Services       Precautions / Restrictions Precautions Precautions: Back Precaution Booklet Issued: Yes (comment) Required Braces or Orthoses: Spinal Brace Spinal Brace: Thoracolumbosacral orthotic;Applied in sitting position(TLSO doesn't fit properly, needs to be refitted) Restrictions Weight Bearing Restrictions: No    Mobility  Bed Mobility Overal bed mobility: Needs Assistance Bed Mobility: Rolling;Sit to Sidelying;Sidelying to Sit Rolling: Min assist Sidelying to sit: Min assist;HOB elevated     Sit to sidelying: Min assist General bed mobility comments: HOB 20 degrees with pt unable to tolerate flat. assist for proper log rolling strategy  Transfers Overall transfer level: Needs assistance Equipment used: Rolling walker (2 wheeled) Transfers: Sit to/from [facility] Sit to Stand: Min assist Stand pivot transfers: Min assist       General transfer comment: pt completed 2 sit to stand, pt reports 'I can't sit in that chair, its too uncomfortable"  Ambulation/Gait Ambulation/Gait assistance: Min assist Gait Distance (Feet): (limited to marching in place x 20, x2 reps) Assistive device:  Rolling walker (2 wheeled)       General Gait Details: TLSO did not fit properly therefore couldn't walker, pt marched in place x20, x2 reps   Stairs             Wheelchair Mobility    Modified Rankin (Stroke Patients Only)       Balance Overall balance assessment: Needs assistance Sitting-balance support: No upper extremity supported;Feet supported Sitting balance-Leahy Scale: Fair     Standing balance support: Bilateral upper extremity supported Standing balance-Leahy Scale: Poor Standing balance comment: reliant on BUE support                            Cognition Arousal/Alertness: Awake/alert Behavior During Therapy: WFL for tasks assessed/performed Overall Cognitive Status: Within Functional Limits for tasks assessed                                 General Comments: min verbal cues for adherance to back precautions, pt in tears due to back and abdominal pain      Exercises      General Comments General comments (skin integrity, edema, etc.): VSS      Pertinent Vitals/Pain Pain Assessment: 0-10 Pain Score: 10-Worst pain ever Pain Location: back and abdomen Pain Descriptors / Indicators: Aching;Constant;Pressure;Shooting Pain Intervention(s): Limited activity within patient's tolerance    Home Living Family/patient expects to be discharged to:: Private residence Living Arrangements: Children Available Help at Discharge: Family;Available 24 hours/day Type of Home: House Home Access: Stairs to enter   Home Layout: One  level Home Equipment: Walker - 2 wheels;Walker - 4 wheels;Shower seat;Cane - single point      Prior Function Level of Independence: Independent      Comments: pt's daughter has medical complications impacting her independence, pt assists daughter    PT Goals (current goals can now be found in the care plan section) Acute Rehab PT Goals Patient Stated Goal: stop the pain Progress towards PT goals: Not  progressing toward goals - comment(limited by pain)    Frequency    Min 4X/week      PT Plan Current plan remains appropriate    Co-evaluation              AM-PAC PT "6 Clicks" Mobility   Outcome Measure  Help needed turning from your back to your side while in a flat bed without using bedrails?: A Little Help needed moving from lying on your back to sitting on the side of a flat bed without using bedrails?: A Little Help needed moving to and from a bed to a chair (including a wheelchair)?: A Little Help needed standing up from a chair using your arms (e.g., wheelchair or bedside chair)?: A Little Help needed to walk in hospital room?: A Lot Help needed climbing 3-5 steps with a railing? : A Lot 6 Click Score: 16    End of Session Equipment Utilized During Treatment: Gait belt Activity Tolerance: Patient tolerated treatment well Patient left: with call bell/phone within reach;in bed;with bed alarm set Nurse Communication: Mobility status;Precautions PT Visit Diagnosis: Other abnormalities of gait and mobility (R26.89);Muscle weakness (generalized) (M62.81);Difficulty in walking, not elsewhere classified (R26.2)     Time: 0939-1000 PT Time Calculation (min) (ACUTE ONLY): 21 min  Charges:  $Therapeutic Activity: 8-22 mins                     Kittie Plater, PT, DPT Acute Rehabilitation Services Pager #: [phone] Office #: [phone]    [place] Lopes 02/18/2019, 1:57 PM

## 2019-02-18 NOTE — Progress Notes (Signed)
Patient ID: Victoria Adams, female   DOB: May 29, 1940, 79 y.o.   MRN: [id number] BP (!) 167/69 (BP Location: Left Arm) Comment: Nurse Pam notified.  Pulse 80   Temp 98.2 F (36.8 C) (Oral)   Resp 16   Ht 5\' 7"  (1.702 m)   Wt 116.6 kg   SpO2 95%   BMI 40.25 kg/m  Alert and oriented x 4, moving all extremities well Significant discomfort from constipation Wound is clean and dry Doing well post op

## 2019-02-18 NOTE — Progress Notes (Signed)
PROGRESS NOTE    Victoria Adams  [id number] DOB: 04-23-1940 DOA: 02/07/2019 PCP: Caryl Bis, MD   Brief Narrative:  Patient is a 78 year old female with history of hypertension, diabetes, systolic congestive heart failure, proximal A. Fib who presents with fall about 2 weeks ago, increased back pain since then.  Imaging on presentation showed worsening of compression fracture of thoracic spine concerning for unstable fracture.  Neurosurgery consulted.  Underwent  T10-T12 posterior lateral fusion with percutaneous nonsegmental pedicle screw fixation and methylmethacrylate augmentation.POD3 .  PT/OT recommended home health.   Hospital course remarkable for ileus, continous  back pain.  Assessment & Plan:   Principal Problem:   Thoracic compression fracture [facility]) Active Problems:   Essential hypertension, benign   Atrial fibrillation (HCC)   Type 2 diabetes mellitus with complication (HCC)   Dyspnea   Back pain   T10-T11 thoracic compression fracture: Presented with severe back pain.  Neurosurgery has been  following. Underwent  T10-T12 posterior lateral fusion with percutaneous nonsegmental pedicle screw fixation and methylmethacrylate augmentation.    Continue pain management.  Added fentanyl patch.PT/OT consultation done.Recommended hHPT. Back pain has improved since yesterday but still there.  Abdominal pain: Continues to complain of abdominal  discomfort.  Abdominal x-ray showed dilated loop of likely large bowel in the left hemiabdomen suggesting ileus . Marland Kitchen  We did encourage ambulation, continue aggressive bowel regimen.  We will give her a dose of Fleet Enema.  Change the diet to clear liquid  Bilateral lower extremity edema/history of systolic CHF: Last echo in clinic in with normal left ventricular dysfunction and normal right ventricular function, she might have left ventricular recovery with CRT device.  Denies any shortness of breath.  Has mild lower extremity bilateral  edema.  She is on Lasix 80 mg twice a day at home, on spironolactone, on discharge.  She was hypotensive on presentation so Lasix was held.  Echocardiogram done on 10/29 showed ejection fraction of 60-65%.  Lasix 40 mg daily resumed.  She may not need Entresto or Aldactone because her ejection fraction is recovered.  She needs to follow-up with cardiology as an outpatient  Hypertension: Continue current medicines.  Continue to monitor BP.  Hypertensive today most likely secondary to pain.  Paroxysmal A. fib: Rate is controlled.  Continue Coreg.  Eliquis on hold for surgical planning  Diabetes mellitus: Continue lantus and sliding scale  insulin.  CKD stage III: Currently kidney function is at baseline.Will check BMP today  Morbid obesity: BMI of 40         DVT prophylaxis: Heparin [place] Code Status:  Full Family Communication: None  present at the bedside Disposition Plan: Home with home health when pain is better,has bowel movement     Consultants: Neurosurgery  Procedures: None  Antimicrobials:  Anti-infectives (From admission, onward)   Start     Dose/Rate Route Frequency Ordered Stop   02/15/19 1600  ceFAZolin (ANCEF) IVPB 2g/100 mL premix     2 g 200 mL/hr over 30 Minutes Intravenous Every 8 hours 02/15/19 1155 02/15/19 2342   02/15/19 0829  bacitracin 50,000 Units in sodium chloride 0.9 % 500 mL irrigation  Status:  Discontinued       As needed 02/15/19 0829 02/15/19 0941      Subjective: Patient seen and examined the bedside this morning.  Hemodynamically stable.  Continues to complain of abdominal bloating, pain.  Did not have a bowel movement yet.  Change the diet to clear liquid.  Back  pain is better than yesterday but is still significantly present. Planning  to give a dose of enema  Objective: Vitals:   02/17/19 0451 02/17/19 1451 02/17/19 2000 02/18/19 0516  BP:  (!) 157/72 123/67 (!) 167/69  Pulse:  85 74 80  Resp:  18 16 16   Temp:  98 F (36.7 C) 98.4 F  (36.9 C) 98.2 F (36.8 C)  TempSrc:  Oral Oral Oral  SpO2:  96% 92% 95%  Weight: 116.6 kg     Height:        Intake/Output Summary (Last 24 hours) at 02/18/2019 1151 Last data filed at 02/18/2019 0926 Gross per 24 hour  Intake 363 ml  Output -  Net 363 ml   Filed Weights   02/11/19 0500 02/12/19 0500 02/17/19 0451  Weight: 115.9 kg 115.4 kg 116.6 kg    Examination:  General exam: Morbidly obese, uncomfortable due to abdominal pain Respiratory system: Bilateral equal air entry, normal vesicular breath sounds, no wheezes or crackles  Cardiovascular system: S1 & S2 heard, RRR. No JVD, murmurs, rubs, gallops or clicks. Gastrointestinal system: Abdomen is  soft ,mildly distended,NT.  Central nervous system: Alert and oriented. No focal neurological deficits. Extremities: No edema, no clubbing ,no cyanosis, distal peripheral pulses palpable. Skin: No rashes, lesions or ulcers,no icterus ,no pallor MSK: Dressings overlying the surgical wound on the back   Data Reviewed: I have personally reviewed following labs and imaging studies  CBC: Recent Labs  Lab 02/16/19 0428  WBC 10.3  NEUTROABS 8.7*  HGB 14.2  HCT 45.0  MCV 93.9  PLT [id number]   Basic Metabolic Panel: Recent Labs  Lab 02/12/19 0502 02/16/19 0428 02/17/19 1056  NA 139 134* 132*  K 4.3 5.4* 4.5  CL 103 93* 96*  CO2 27 26 25   GLUCOSE 138* 306* 312*  BUN 22 24* 23  CREATININE 1.32* 1.44* 1.14*  CALCIUM 9.0 8.9 9.0   GFR: Estimated Creatinine Clearance: 55.4 mL/min (A) (by C-G formula based on SCr of 1.14 mg/dL (H)). Liver Function Tests: No results for input(s): AST, ALT, ALKPHOS, BILITOT, PROT, ALBUMIN in the last 168 hours. No results for input(s): LIPASE, AMYLASE in the last 168 hours. No results for input(s): AMMONIA in the last 168 hours. Coagulation Profile: No results for input(s): INR, PROTIME in the last 168 hours. Cardiac Enzymes: No results for input(s): CKTOTAL, CKMB, CKMBINDEX, TROPONINI in  the last 168 hours. BNP (last 3 results) No results for input(s): PROBNP in the last 8760 hours. HbA1C: No results for input(s): HGBA1C in the last 72 hours. CBG: Recent Labs  Lab 02/17/19 0638 02/17/19 1207 02/17/19 1617 02/17/19 2140 02/18/19 0644  GLUCAP 312* 315* 266* 290* 282*   Lipid Profile: No results for input(s): CHOL, HDL, LDLCALC, TRIG, CHOLHDL, LDLDIRECT in the last 72 hours. Thyroid Function Tests: No results for input(s): TSH, T4TOTAL, FREET4, T3FREE, THYROIDAB in the last 72 hours. Anemia Panel: No results for input(s): VITAMINB12, FOLATE, FERRITIN, TIBC, IRON, RETICCTPCT in the last 72 hours. Sepsis Labs: No results for input(s): PROCALCITON, LATICACIDVEN in the last 168 hours.  No results found for this or any previous visit (from the past 240 hour(s)).       Radiology Studies: Dg Abd 1 View  Result Date: 02/17/2019 CLINICAL DATA:  Hx of spinal surgery on 02/15/19. Pt complains of abdominal pain and cramping. EXAM: ABDOMEN - 1 VIEW COMPARISON:  CT abdomen pelvis 01/21/2019 FINDINGS: There is at least one dilated loop of likely large bowel in  the left hemiabdomen. Gas is seen to the level of the rectum. No definite dilated loops of small bowel. No supine evidence for free air. No unexpected calcification. Spinal hardware noted as well as partially visualized chest ICD lead. IMPRESSION: Dilated loop of likely large bowel in the left hemiabdomen may represent ileus. No definite dilated loops of small bowel to suggest obstruction. Electronically Signed   By: Audie Pinto M.D.   On: 02/17/2019 11:43        Scheduled Meds: . bisacodyl  10 mg Rectal Once  . carvedilol  25 mg Oral BID WC  . colchicine  0.6 mg Oral Daily  . docusate sodium  100 mg Oral BID  . fentaNYL  1 patch Transdermal Q72H  . ferrous sulfate  325 mg Oral Q breakfast  . furosemide  40 mg Oral Daily  . heparin injection (subcutaneous)  5,000 Units Subcutaneous Q8H  . insulin aspart  0-9  Units Subcutaneous TID WC  . insulin glargine  30 Units Subcutaneous Daily  . pantoprazole  40 mg Oral Daily  . polyethylene glycol  17 g Oral Daily  . rosuvastatin  20 mg Oral Daily  . senna-docusate  2 tablet Oral BID  . sertraline  100 mg Oral Daily  . sodium chloride flush  3 mL Intravenous Q12H  . sodium phosphate  1 enema Rectal Once   Continuous Infusions: . sodium chloride       LOS: 11 days    Time spent: 25 mins.More than 50% of that time was spent in counseling and/or coordination of care.      Shelly Coss, MD [facility] Pager [phone]  If 7PM-7AM, please contact night-coverage [email or web address] Password [facility] 02/18/2019, 11:51 AM

## 2019-02-18 NOTE — Plan of Care (Signed)
  Problem: Pain Managment: Goal: General experience of comfort will improve Outcome: Progressing   Problem: Safety: Goal: Ability to remain free from injury will improve Outcome: Progressing   Problem: Skin Integrity: Goal: Risk for impaired skin integrity will decrease Outcome: Progressing   Problem: Activity: Goal: Capacity to carry out activities will improve Outcome: Progressing

## 2019-02-18 NOTE — Progress Notes (Addendum)
Occupational Therapy Evaluation Patient Details Name: Victoria Adams MRN: [id number] DOB: 17-Nov-1940 Today's Date: 02/18/2019    History of Present Illness 78 yo admitted after fall with back pain with compression fx s/p T10-T12 PLIF. PMhx: DM, AFib, CKD, HTN, CHF   Clinical Impression   PTA, pt was living at home with her daughter and son , pt reports she was independent with ADL/IADL and functional mobility at RW level. Pt returning from bathroom with NT upon OT arrival. Pt requires minA for sit<>stand and stand-pivot transfer at RW level, she requires modA for LB dressing, and minA for bed mobiltiy. Due to decline in current level of function, pt would benefit from acute OT to address established goals to facilitate safe D/C to venue listed below. At this time, recommend HHOT follow-up. Will continue to follow acutely.  Pt's TLSO brace fit incorrectly, pt will need to be re-fit for brace,RN notified.     Follow Up Recommendations  Home health OT;Supervision/Assistance - 24 hour    Equipment Recommendations  3 in 1 bedside commode    Recommendations for Other Services       Precautions / Restrictions Precautions Precautions: Back Precaution Booklet Issued: Yes (comment) Required Braces or Orthoses: Spinal Brace Spinal Brace: Thoracolumbosacral orthotic Restrictions Weight Bearing Restrictions: No      Mobility Bed Mobility Overal bed mobility: Needs Assistance Bed Mobility: Rolling;Sit to Sidelying Rolling: Min assist Sidelying to sit: Min assist;HOB elevated     Sit to sidelying: Min assist General bed mobility comments: HOB 20 degrees with pt unable to tolerate flat. assist for proper log rolling strategy  Transfers Overall transfer level: Needs assistance   Transfers: Sit to/from Stand;Stand Pivot Transfers Sit to Stand: Min assist Stand pivot transfers: Min assist       General transfer comment: pt returning from bathroom with NT upon OT arrival, observed  sit<>stand and stnad-pivot     Balance Overall balance assessment: Needs assistance Sitting-balance support: No upper extremity supported;Feet supported Sitting balance-Leahy Scale: Fair     Standing balance support: Single extremity supported Standing balance-Leahy Scale: Poor Standing balance comment: reliant on BUE support                           ADL either performed or assessed with clinical judgement   ADL Overall ADL's : Needs assistance/impaired Eating/Feeding: Set up;Sitting   Grooming: Minimal assistance;Standing Grooming Details (indicate cue type and reason): educated pt on compensatory strategies with grooming Upper Body Bathing: Minimal assistance;Sitting   Lower Body Bathing: Minimal assistance   Upper Body Dressing : Minimal assistance Upper Body Dressing Details (indicate cue type and reason): TLSO brace does not fit correctly, RN aware Lower Body Dressing: Moderate assistance Lower Body Dressing Details (indicate cue type and reason): to access bLE Toilet Transfer: Minimal assistance;Ambulation Toilet Transfer Details (indicate cue type and reason): returning from commode with NT Toileting- Clothing Manipulation and Hygiene: Minimal assistance;Sit to/from stand;Maximal assistance       Functional mobility during ADLs: Minimal assistance;Rolling walker General ADL Comments: began education on AE to assist with dressing;educated pt on compensatory strategies during aDL     Vision         Perception     Praxis      Pertinent Vitals/Pain Pain Assessment: 0-10 Pain Score: 7  Pain Location: back and abdomen Pain Descriptors / Indicators: Aching;Constant;Pressure Pain Intervention(s): Limited activity within patient's tolerance;Repositioned;Patient requesting pain meds-RN notified     Hand  Dominance Right   Extremity/Trunk Assessment Upper Extremity Assessment Upper Extremity Assessment: Generalized weakness   Lower Extremity  Assessment Lower Extremity Assessment: Generalized weakness   Cervical / Trunk Assessment Cervical / Trunk Assessment: Other exceptions Cervical / Trunk Exceptions: back precautions   Communication Communication Communication: No difficulties   Cognition Arousal/Alertness: Awake/alert Behavior During Therapy: WFL for tasks assessed/performed Overall Cognitive Status: Within Functional Limits for tasks assessed                                 General Comments: requires min vc to adhere to back precautions   General Comments  VSS, pt requesting pain meds    Exercises     Shoulder Instructions      Home Living Family/patient expects to be discharged to:: Private residence Living Arrangements: Children Available Help at Discharge: Family;Available 24 hours/day Type of Home: House Home Access: Stairs to enter [employer] of Steps: 6   Home Layout: One level     Bathroom Shower/Tub: Occupational psychologist: Standard     Home Equipment: Environmental consultant - 2 wheels;Walker - 4 wheels;Shower seat;Cane - single point          Prior Functioning/Environment Level of Independence: Independent        Comments: pt's daughter has medical complications impacting her independence, pt assists daughter         OT Problem List: Decreased strength;Decreased activity tolerance;Impaired balance (sitting and/or standing);Decreased safety awareness;Decreased knowledge of precautions;Pain      OT Treatment/Interventions: Self-care/ADL training;Therapeutic exercise;Energy conservation;DME and/or AE instruction;Therapeutic activities;Patient/family education;Balance training    OT Goals(Current goals can be found in the care plan section) Acute Rehab OT Goals Patient Stated Goal: to return home OT Goal Formulation: With patient Time For Goal Achievement: 03/04/19 Potential to Achieve Goals: Good ADL Goals Pt Will Perform Grooming: with modified  independence;standing;sitting Pt Will Perform Upper Body Dressing: with modified independence;sitting Pt Will Perform Lower Body Dressing: with modified independence;sit to/from stand;with adaptive equipment Pt Will Transfer to Toilet: with modified independence;ambulating Pt Will Perform Toileting - Clothing Manipulation and hygiene: with modified independence;sit to/from stand  OT Frequency: Min 2X/week   Barriers to D/C: Decreased caregiver support          Co-evaluation              AM-PAC OT "6 Clicks" Daily Activity     Outcome Measure Help from another person eating meals?: A Little Help from another person taking care of personal grooming?: A Little Help from another person toileting, which includes using toliet, bedpan, or urinal?: A Lot Help from another person bathing (including washing, rinsing, drying)?: A Little Help from another person to put on and taking off regular upper body clothing?: A Little Help from another person to put on and taking off regular lower body clothing?: A Little 6 Click Score: 17   End of Session Equipment Utilized During Treatment: Rolling walker Nurse Communication: Mobility status  Activity Tolerance: Patient tolerated treatment well;Patient limited by pain Patient left: in bed;with call bell/phone within reach;with bed alarm set  OT Visit Diagnosis: Unsteadiness on feet (R26.81);Other abnormalities of gait and mobility (R26.89);Muscle weakness (generalized) (M62.81);Pain Pain - part of body: (back)                Time: [id number] OT Time Calculation (min): 10 min Charges:  OT General Charges $OT Visit: 1 Visit OT Evaluation $OT Eval Moderate  Complexity: [place] OTR/L Acute Rehabilitation Services Office: [place] 02/18/2019, 12:53 PM

## 2019-02-19 ENCOUNTER — Inpatient Hospital Stay (HOSPITAL_COMMUNITY): Payer: Medicare Other

## 2019-02-19 LAB — GLUCOSE, CAPILLARY
Glucose-Capillary: 185 mg/dL — ABNORMAL HIGH (ref 70–99)
Glucose-Capillary: 182 mg/dL — ABNORMAL HIGH (ref 70–99)
Glucose-Capillary: 231 mg/dL — ABNORMAL HIGH (ref 70–99)
Glucose-Capillary: 269 mg/dL — ABNORMAL HIGH (ref 70–99)

## 2019-02-19 MED ORDER — FENTANYL CITRATE (PF) 100 MCG/2ML IJ SOLN
50.0000 ug | Freq: Once | INTRAMUSCULAR | Status: AC
  Administered 2019-02-19: 50 ug via INTRAVENOUS
  Filled 2019-02-19: qty 2

## 2019-02-19 MED ORDER — DICYCLOMINE HCL 20 MG PO TABS
20.0000 mg | ORAL_TABLET | Freq: Three times a day (TID) | ORAL | Status: DC
  Administered 2019-02-19 – 2019-02-21 (×10): 20 mg via ORAL
  Filled 2019-02-19 (×13): qty 1

## 2019-02-19 MED ORDER — METHOCARBAMOL 1000 MG/10ML IJ SOLN
500.0000 mg | Freq: Three times a day (TID) | INTRAVENOUS | Status: DC | PRN
  Administered 2019-02-20: 500 mg via INTRAVENOUS
  Filled 2019-02-19 (×3): qty 5

## 2019-02-19 MED ORDER — HYDROMORPHONE HCL 1 MG/ML IJ SOLN
0.5000 mg | INTRAMUSCULAR | Status: DC | PRN
  Administered 2019-02-19 – 2019-02-20 (×7): 0.5 mg via INTRAVENOUS
  Filled 2019-02-19 (×7): qty 1

## 2019-02-19 NOTE — Progress Notes (Signed)
PT Cancellation Note  Patient Details Name: Victoria Adams MRN: [id number] DOB: Sep 23, 1940   Cancelled Treatment:    Reason Eval/Treat Not Completed: Other (comment)   Refusing mobility and brace fit due to pain; Had refused to let Ronalee Belts from [employer] check her brace fit 15 minutes prior to my arrival as well;   Going to [place] per Doroteo Bradford, Therapist, sports;   Will plan to see her in conjunction with Ronalee Belts (for brace fit) tomorrow;   Roney Marion, [place] Pager [phone] Office [phone]    [name] 02/19/2019, 12:28 PM

## 2019-02-19 NOTE — Progress Notes (Signed)
PROGRESS NOTE    Victoria Adams  [id number] DOB: 05/21/1940 DOA: 02/07/2019 PCP: Caryl Bis, MD   Brief Narrative:  Patient is a 78 year old female with history of hypertension, diabetes, systolic congestive heart failure, proximal A. Fib who presents with fall about 2 weeks ago, increased back pain since then.  Imaging on presentation showed worsening of compression fracture of thoracic spine concerning for unstable fracture.  Neurosurgery consulted.  Underwent  T10-T12 posterior lateral fusion with percutaneous nonsegmental pedicle screw fixation and methylmethacrylate augmentation.POD3 .  PT/OT recommended home health.   Hospital course remarkable for ileus, continous abdominal , back pain.  Assessment & Plan:   Principal Problem:   Thoracic compression fracture [facility]) Active Problems:   Essential hypertension, benign   Atrial fibrillation (HCC)   Type 2 diabetes mellitus with complication (HCC)   Dyspnea   Back pain   T10-T11 thoracic compression fracture: Presented with severe back pain.  Neurosurgery has been  following. Underwent  T10-T12 posterior lateral fusion with percutaneous nonsegmental pedicle screw fixation and methylmethacrylate augmentation.    Continue pain management.  Added fentanyl patch.PT/OT consultation done.Recommended hHPT. Back pain has improved since yesterday but still there.  Abdominal pain: Continues to complain of abdominal  discomfort.  Abdominal x-ray showed dilated loop of likely large bowel in the left hemiabdomen suggesting ileus .  We did encourage ambulation, continue aggressive bowel regimen.  Given a dose of Fleet Enema.  Change the diet to clear liquid.  She had a bowel movement yesterday.  This morning she was continuously complaining of abdomen pain on the sides.  Abdomen is nondistended, nontender, has good bowel sounds.  Abdominal pain is disproportionate to the examination.  I will request for CT abdomen with oral contrast.  Continue  muscle relaxants, antispasmodics.  Bilateral lower extremity edema/history of systolic CHF: Last echo in clinic in with normal left ventricular dysfunction and normal right ventricular function, she might have left ventricular recovery with CRT device.  Denies any shortness of breath.  Has mild lower extremity bilateral edema.  She is on Lasix 80 mg twice a day at home, on spironolactone, on discharge.  She was hypotensive on presentation so Lasix was held.  Echocardiogram done on 10/29 showed ejection fraction of 60-65%.  Lasix 40 mg daily resumed.  She may not need Entresto or Aldactone because her ejection fraction is recovered.  She needs to follow-up with cardiology as an outpatient  Hypertension: Continue current medicines.  Continue to monitor BP.  Hypertensive today most likely secondary to pain.  Paroxysmal A. fib: Rate is controlled.  Continue Coreg.  Eliquis on hold for surgical planning  Diabetes mellitus: Continue lantus and sliding scale  insulin.  CKD stage III: Currently kidney function is at baseline.Will check BMP today  Morbid obesity: BMI of 40         DVT prophylaxis: Heparin [place] Code Status:  Full Family Communication: Called daughter Sherral Hammers on phone,not received Disposition Plan: Home with home health when abd/back pain is better    Consultants: Neurosurgery  Procedures: None  Antimicrobials:  Anti-infectives (From admission, onward)   Start     Dose/Rate Route Frequency Ordered Stop   02/15/19 1600  ceFAZolin (ANCEF) IVPB 2g/100 mL premix     2 g 200 mL/hr over 30 Minutes Intravenous Every 8 hours 02/15/19 1155 02/15/19 2342   02/15/19 0829  bacitracin 50,000 Units in sodium chloride 0.9 % 500 mL irrigation  Status:  Discontinued       As  needed 02/15/19 0829 02/15/19 0941      Subjective: Patient seen and examined the bedside this morning.  Hemodynamically stable.  Continues to complain of abdominal discomfort.  Has been passing gas and had a bowel  movement yesterday.  Back pain better than yesterday  Objective: Vitals:   02/18/19 1931 02/19/19 0020 02/19/19 0510 02/19/19 0809  BP: 129/65 (!) 157/77 (!) 142/82 (!) 192/83  Pulse: 78 74 88 83  Resp: 16 16 18 15   Temp: 98.2 F (36.8 C) 98 F (36.7 C) 98.3 F (36.8 C) 98.5 F (36.9 C)  TempSrc: Oral Oral Oral Oral  SpO2: 96% 95% 100% 97%  Weight:      Height:        Intake/Output Summary (Last 24 hours) at 02/19/2019 1254 Last data filed at 02/19/2019 0900 Gross per 24 hour  Intake 240 ml  Output 5 ml  Net 235 ml   Filed Weights   02/11/19 0500 02/12/19 0500 02/17/19 0451  Weight: 115.9 kg 115.4 kg 116.6 kg    Examination:  General exam: Morbidly obese, uncomfortable due to abdominal pain Respiratory system: Bilateral equal air entry, normal vesicular breath sounds, no wheezes or crackles  Cardiovascular system: S1 & S2 heard, RRR. No JVD, murmurs, rubs, gallops or clicks. Gastrointestinal system: Abdomen is  soft ,mildly distended,NT.  Central nervous system: Alert and oriented. No focal neurological deficits. Extremities: No edema, no clubbing ,no cyanosis, distal peripheral pulses palpable. Skin: No rashes, lesions or ulcers,no icterus ,no pallor MSK: Dressings overlying the surgical wound on the back   Data Reviewed: I have personally reviewed following labs and imaging studies  CBC: Recent Labs  Lab 02/16/19 0428  WBC 10.3  NEUTROABS 8.7*  HGB 14.2  HCT 45.0  MCV 93.9  PLT [id number]   Basic Metabolic Panel: Recent Labs  Lab 02/16/19 0428 02/17/19 1056  NA 134* 132*  K 5.4* 4.5  CL 93* 96*  CO2 26 25  GLUCOSE 306* 312*  BUN 24* 23  CREATININE 1.44* 1.14*  CALCIUM 8.9 9.0   GFR: Estimated Creatinine Clearance: 55.4 mL/min (A) (by C-G formula based on SCr of 1.14 mg/dL (H)). Liver Function Tests: No results for input(s): AST, ALT, ALKPHOS, BILITOT, PROT, ALBUMIN in the last 168 hours. No results for input(s): LIPASE, AMYLASE in the last 168 hours.  No results for input(s): AMMONIA in the last 168 hours. Coagulation Profile: No results for input(s): INR, PROTIME in the last 168 hours. Cardiac Enzymes: No results for input(s): CKTOTAL, CKMB, CKMBINDEX, TROPONINI in the last 168 hours. BNP (last 3 results) No results for input(s): PROBNP in the last 8760 hours. HbA1C: No results for input(s): HGBA1C in the last 72 hours. CBG: Recent Labs  Lab 02/18/19 1242 02/18/19 1629 02/18/19 2304 02/19/19 0649 02/19/19 1134  GLUCAP 243* 290* 201* 185* 182*   Lipid Profile: No results for input(s): CHOL, HDL, LDLCALC, TRIG, CHOLHDL, LDLDIRECT in the last 72 hours. Thyroid Function Tests: No results for input(s): TSH, T4TOTAL, FREET4, T3FREE, THYROIDAB in the last 72 hours. Anemia Panel: No results for input(s): VITAMINB12, FOLATE, FERRITIN, TIBC, IRON, RETICCTPCT in the last 72 hours. Sepsis Labs: No results for input(s): PROCALCITON, LATICACIDVEN in the last 168 hours.  No results found for this or any previous visit (from the past 240 hour(s)).       Radiology Studies: No results found.      Scheduled Meds: . bisacodyl  10 mg Rectal Once  . carvedilol  25 mg Oral BID WC  .  colchicine  0.6 mg Oral Daily  . dicyclomine  20 mg Oral TID AC & HS  . docusate sodium  100 mg Oral BID  . fentaNYL  1 patch Transdermal Q72H  . ferrous sulfate  325 mg Oral Q breakfast  . furosemide  40 mg Oral Daily  . heparin injection (subcutaneous)  5,000 Units Subcutaneous Q8H  . insulin aspart  0-9 Units Subcutaneous TID WC  . insulin glargine  30 Units Subcutaneous Daily  . lidocaine  1 patch Transdermal Q24H  . pantoprazole  40 mg Oral Daily  . polyethylene glycol  17 g Oral Daily  . rosuvastatin  20 mg Oral Daily  . senna-docusate  2 tablet Oral BID  . sertraline  100 mg Oral Daily  . sodium chloride flush  3 mL Intravenous Q12H  . sodium phosphate  1 enema Rectal Once   Continuous Infusions: . sodium chloride    . methocarbamol  (ROBAXIN) IV       LOS: 12 days    Time spent: 25 mins.More than 50% of that time was spent in counseling and/or coordination of care.      Shelly Coss, MD [facility] Pager [phone]  If 7PM-7AM, please contact night-coverage [email or web address] Password [facility] 02/19/2019, 12:54 PM

## 2019-02-19 NOTE — Plan of Care (Signed)
  Problem: Pain Managment: Goal: General experience of comfort will improve Outcome: Progressing   Problem: Safety: Goal: Ability to remain free from injury will improve Outcome: Progressing   Problem: Skin Integrity: Goal: Risk for impaired skin integrity will decrease Outcome: Progressing   Problem: Activity: Goal: Capacity to carry out activities will improve Outcome: Progressing

## 2019-02-19 NOTE — Progress Notes (Signed)
OT Cancellation Note  Patient Details Name: Victoria Adams MRN: [id number] DOB: July 28, 1940   Cancelled Treatment:    Reason Eval/Treat Not Completed: Pain limiting ability to participate. Pt declined OT session this date secondary to pain and nausea. Ronalee Belts, from [place], arrived to fit pt for her TLSO brace, she declined this date due to pain. OT will return at later time when pt is appropriate and time allows.   Dorinda Hill OTR/L Acute Rehabilitation Services Office: [place] 02/19/2019, 1:08 PM

## 2019-02-19 NOTE — Progress Notes (Signed)
Notified Dr Silas [place] of pt states the pain to bilateral abd is worse. The dilaudid helps for a little while, then it comes back. Also had Zofran x 1 tonight for increased nausea. Will continue to monitor.

## 2019-02-20 LAB — BASIC METABOLIC PANEL
Anion gap: 10 (ref 5–15)
BUN: 19 mg/dL (ref 8–23)
CO2: 30 mmol/L (ref 22–32)
Calcium: 8.9 mg/dL (ref 8.9–10.3)
Chloride: 93 mmol/L — ABNORMAL LOW (ref 98–111)
Creatinine, Ser: 1.08 mg/dL — ABNORMAL HIGH (ref 0.44–1.00)
GFR calc Af Amer: 58 mL/min — ABNORMAL LOW (ref >60)
GFR calc non Af Amer: 50 mL/min — ABNORMAL LOW (ref >60)
Glucose, Bld: 268 mg/dL — ABNORMAL HIGH (ref 70–99)
Potassium: 3.8 mmol/L (ref 3.5–5.1)
Sodium: 133 mmol/L — ABNORMAL LOW (ref 135–145)

## 2019-02-20 LAB — CBC WITH DIFFERENTIAL/PLATELET
Abs Immature Granulocytes: 0.04 10*3/uL (ref 0.00–0.07)
Basophils Absolute: 0 10*3/uL (ref 0.0–0.1)
Basophils Relative: 1 %
Eosinophils Absolute: 0.1 10*3/uL (ref 0.0–0.5)
Eosinophils Relative: 1 %
HCT: 40.7 % (ref 36.0–46.0)
Hemoglobin: 13.7 g/dL (ref 12.0–15.0)
Immature Granulocytes: 1 %
Lymphocytes Relative: 17 %
Lymphs Abs: 1.1 10*3/uL (ref 0.7–4.0)
MCH: 29.8 pg (ref 26.0–34.0)
MCHC: 33.7 g/dL (ref 30.0–36.0)
MCV: 88.7 fL (ref 80.0–100.0)
Monocytes Absolute: 0.8 10*3/uL (ref 0.1–1.0)
Monocytes Relative: 12 %
Neutro Abs: 4.5 10*3/uL (ref 1.7–7.7)
Neutrophils Relative %: 68 %
Platelets: 151 10*3/uL (ref 150–400)
RBC: 4.59 MIL/uL (ref 3.87–5.11)
RDW: 13.3 % (ref 11.5–15.5)
WBC: 6.6 10*3/uL (ref 4.0–10.5)
nRBC: 0 % (ref 0.0–0.2)

## 2019-02-20 LAB — GLUCOSE, CAPILLARY
Glucose-Capillary: 234 mg/dL — ABNORMAL HIGH (ref 70–99)
Glucose-Capillary: 232 mg/dL — ABNORMAL HIGH (ref 70–99)
Glucose-Capillary: 258 mg/dL — ABNORMAL HIGH (ref 70–99)
Glucose-Capillary: 138 mg/dL — ABNORMAL HIGH (ref 70–99)

## 2019-02-20 MED ORDER — INSULIN GLARGINE 100 UNIT/ML ~~LOC~~ SOLN
5.0000 [IU] | Freq: Once | SUBCUTANEOUS | Status: AC
  Administered 2019-02-20: 5 [IU] via SUBCUTANEOUS
  Filled 2019-02-20: qty 0.05

## 2019-02-20 MED ORDER — DIAZEPAM 5 MG PO TABS
5.0000 mg | ORAL_TABLET | Freq: Four times a day (QID) | ORAL | Status: DC | PRN
  Administered 2019-02-20: 5 mg via ORAL
  Filled 2019-02-20: qty 1

## 2019-02-20 MED ORDER — AMLODIPINE BESYLATE 10 MG PO TABS
10.0000 mg | ORAL_TABLET | Freq: Every day | ORAL | Status: DC
  Administered 2019-02-20 – 2019-02-21 (×2): 10 mg via ORAL
  Filled 2019-02-20 (×2): qty 1

## 2019-02-20 MED ORDER — INSULIN GLARGINE 100 UNIT/ML ~~LOC~~ SOLN
35.0000 [IU] | Freq: Every day | SUBCUTANEOUS | Status: DC
  Administered 2019-02-21: 35 [IU] via SUBCUTANEOUS
  Filled 2019-02-20: qty 0.35

## 2019-02-20 MED ORDER — GABAPENTIN 300 MG PO CAPS
300.0000 mg | ORAL_CAPSULE | Freq: Three times a day (TID) | ORAL | Status: DC
  Administered 2019-02-20 – 2019-02-21 (×4): 300 mg via ORAL
  Filled 2019-02-20 (×5): qty 1

## 2019-02-20 MED ORDER — POLYETHYLENE GLYCOL 3350 17 G PO PACK
17.0000 g | PACK | Freq: Two times a day (BID) | ORAL | Status: DC
  Administered 2019-02-20: 17 g via ORAL
  Filled 2019-02-20: qty 1

## 2019-02-20 NOTE — TOC Progression Note (Signed)
Transition of Care [facility]) - Progression Note    Patient Details  Name: Victoria Adams MRN: [id number] Date of Birth: 23-Jun-1940  Transition of Care [facility]) CM/SW [place] RN, BSN, NCM-BC, [place] [phone] Phone Number: 02/20/2019, 3:08 PM  Clinical Narrative:    [place] arranged with [place] for [facility] needs; DME arranged with [employer]; patients daughter available to provide 24hr assistance. Patient continue to have post-surgical pain and not medically stable for DC, with CM to continue to follow.    Expected Discharge Plan: [place] Barriers to Discharge: Continued Medical Work up  Expected Discharge Plan and Services Expected Discharge Plan: [place] In-house Referral: NA Discharge Planning Services: CM Consult Post Acute Care Choice: Home Health, Durable Medical Equipment Living arrangements for the past 2 months: Single Family Home                 DME Arranged: Bedside commode DME Agency: [employer] Date DME Agency Contacted: 02/16/19 Time DME Agency Contacted: 1620 Representative spoke with at DME Agency: Bertrum Sol (liaison) [place] Arranged: PT [place]: [place] Date [place]: 02/16/19 Time [place]: 1620 Representative spoke with at [place]: [place] (liaison)   Social Determinants of Health (SDOH) Interventions    Readmission Risk Interventions No flowsheet data found.

## 2019-02-20 NOTE — Progress Notes (Signed)
PROGRESS NOTE    Victoria Adams  [id number] DOB: Dec 13, 1940 DOA: 02/07/2019 PCP: Caryl Bis, MD   Brief Narrative:  Patient is a 78 year old female with history of hypertension, diabetes, systolic congestive heart failure, proximal A. Fib who presents with fall about 2 weeks ago, increased back pain since then.  Imaging on presentation showed worsening of compression fracture of thoracic spine concerning for unstable fracture.  Neurosurgery consulted.  Underwent  T10-T12 posterior lateral fusion with percutaneous nonsegmental pedicle screw fixation and methylmethacrylate augmentation.POD3 .  PT/OT recommended home health.   Hospital course remarkable for continous abdominal of unknown origin , back pain.  Assessment & Plan:   Principal Problem:   Thoracic compression fracture [facility]) Active Problems:   Essential hypertension, benign   Atrial fibrillation (HCC)   Type 2 diabetes mellitus with complication (HCC)   Dyspnea   Back pain   T10-T11 thoracic compression fracture: Presented with severe back pain.  Neurosurgery has been  following. Underwent  T10-T12 posterior lateral fusion with percutaneous nonsegmental pedicle screw fixation and methylmethacrylate augmentation.    Continue pain management.  Added fentanyl patch.PT/OT consultation done.Recommended hHPT. Back pain has improved since yesterday .  Abdominal pain: Unknown etiology.Continues to complain of abdominal  Discomfort on the bilateral sides.  Abdominal x-ray few days ago showed dilated loop of likely large bowel in the left hemiabdomen suggesting ileus .  Abdomen is nondistended, nontender, has good bowel sounds.  Abdominal pain is disproportionate to the examination. CT abdomen with oral contrast did not show any acute intra-abdominal abnormalities.  I also discussed with general surgery and did not have any reason for the consultation and listed gabapentin because of possible referred neuropathic pain from the recent  back surgery. Continue muscle relaxants, antispasmodics,gabapentin,anxiolytics. We did encourage ambulation, continue aggressive bowel regimen.  Given a dose of Fleet Enema.    She has been having  bowel movements.   Bilateral lower extremity edema/history of systolic CHF: Last echo in clinic in with normal left ventricular dysfunction and normal right ventricular function, she might have left ventricular recovery with CRT device.  Denies any shortness of breath.   She is on Lasix 80 mg twice a day at home, on spironolactone, on entresto.  Echocardiogram done on 10/29 showed ejection fraction of 60-65%.  Lasix 40 mg daily resumed.  She may not need Entresto or Aldactone because her ejection fraction is recovered.  She needs to follow-up with cardiology as an outpatient  Hypertension: Continue current medicines.Added amlodipine  Continue to monitor BP.  Hypertensive today most likely secondary to pain.  Paroxysmal A. fib: Rate is controlled.  Continue Coreg.  Eliquis on hold for surgical planning  Diabetes mellitus: Continue lantus and sliding scale  insulin.  CKD stage III: Currently kidney function is at baseline.Will check BMP today  Morbid obesity: BMI of 40         DVT prophylaxis: Heparin [place] Code Status:  Full Family Communication: Called daughter Sherral Hammers on phone on 02/19/19 Disposition Plan: Home with home health likely tomorrow  Consultants: Neurosurgery  Procedures: None  Antimicrobials:  Anti-infectives (From admission, onward)   Start     Dose/Rate Route Frequency Ordered Stop   02/15/19 1600  ceFAZolin (ANCEF) IVPB 2g/100 mL premix     2 g 200 mL/hr over 30 Minutes Intravenous Every 8 hours 02/15/19 1155 02/15/19 2342   02/15/19 0829  bacitracin 50,000 Units in sodium chloride 0.9 % 500 mL irrigation  Status:  Discontinued  As needed 02/15/19 0829 02/15/19 0941      Subjective: Patient seen and examined the bedside this morning.  Hemodynamically stable.   Continues to complain of abdominal discomfort mainly on the sides.  Had a bowel movement yesterday. I talked to the daughter on phone today.  Objective: Vitals:   02/19/19 1358 02/19/19 1940 02/20/19 0342 02/20/19 0700  BP: (!) 154/88 (!) 148/74 (!) 149/86 (!) 179/91  Pulse: 75 77 80 80  Resp: 17 16 16 20   Temp:  98.1 F (36.7 C) 98.5 F (36.9 C) 98.9 F (37.2 C)  TempSrc:  Oral Oral Oral  SpO2: 98% 96% 97% 99%  Weight:      Height:        Intake/Output Summary (Last 24 hours) at 02/20/2019 1316 Last data filed at 02/20/2019 0500 Gross per 24 hour  Intake 395.4 ml  Output 4 ml  Net 391.4 ml   Filed Weights   02/11/19 0500 02/12/19 0500 02/17/19 0451  Weight: 115.9 kg 115.4 kg 116.6 kg    Examination:  General exam: Morbidly obese, uncomfortable due to abdominal pain Respiratory system: Bilateral equal air entry, normal vesicular breath sounds, no wheezes or crackles  Cardiovascular system: S1 & S2 heard, RRR. No JVD, murmurs, rubs, gallops or clicks. Gastrointestinal system: Abdomen is  soft ,mildly distended,NT.  Central nervous system: Alert and oriented. No focal neurological deficits. Extremities: No edema, no clubbing ,no cyanosis, distal peripheral pulses palpable. Skin: No rashes, lesions or ulcers,no icterus ,no pallor MSK: Dressings overlying the surgical wound on the back   Data Reviewed: I have personally reviewed following labs and imaging studies  CBC: Recent Labs  Lab 02/16/19 0428 02/20/19 1057  WBC 10.3 6.6  NEUTROABS 8.7* 4.5  HGB 14.2 13.7  HCT 45.0 40.7  MCV 93.9 88.7  PLT 157 [id number]   Basic Metabolic Panel: Recent Labs  Lab 02/16/19 0428 02/17/19 1056 02/20/19 1057  NA 134* 132* 133*  K 5.4* 4.5 3.8  CL 93* 96* 93*  CO2 26 25 30   GLUCOSE 306* 312* 268*  BUN 24* 23 19  CREATININE 1.44* 1.14* 1.08*  CALCIUM 8.9 9.0 8.9   GFR: Estimated Creatinine Clearance: 58.5 mL/min (A) (by C-G formula based on SCr of 1.08 mg/dL (H)). Liver  Function Tests: No results for input(s): AST, ALT, ALKPHOS, BILITOT, PROT, ALBUMIN in the last 168 hours. No results for input(s): LIPASE, AMYLASE in the last 168 hours. No results for input(s): AMMONIA in the last 168 hours. Coagulation Profile: No results for input(s): INR, PROTIME in the last 168 hours. Cardiac Enzymes: No results for input(s): CKTOTAL, CKMB, CKMBINDEX, TROPONINI in the last 168 hours. BNP (last 3 results) No results for input(s): PROBNP in the last 8760 hours. HbA1C: No results for input(s): HGBA1C in the last 72 hours. CBG: Recent Labs  Lab 02/19/19 1134 02/19/19 1616 02/19/19 2129 02/20/19 0633 02/20/19 1131  GLUCAP 182* 231* 269* 234* 232*   Lipid Profile: No results for input(s): CHOL, HDL, LDLCALC, TRIG, CHOLHDL, LDLDIRECT in the last 72 hours. Thyroid Function Tests: No results for input(s): TSH, T4TOTAL, FREET4, T3FREE, THYROIDAB in the last 72 hours. Anemia Panel: No results for input(s): VITAMINB12, FOLATE, FERRITIN, TIBC, IRON, RETICCTPCT in the last 72 hours. Sepsis Labs: No results for input(s): PROCALCITON, LATICACIDVEN in the last 168 hours.  No results found for this or any previous visit (from the past 240 hour(s)).       Radiology Studies: Ct Abdomen Pelvis Wo Contrast  Result Date:  02/19/2019 CLINICAL DATA:  Acute generalized abdominal pain. EXAM: CT ABDOMEN AND PELVIS WITHOUT CONTRAST TECHNIQUE: Multidetector CT imaging of the abdomen and pelvis was performed following the standard protocol without IV contrast. COMPARISON:  01/21/2019 FINDINGS: Lower chest: Unremarkable Hepatobiliary: Subtle nodularity of liver contour noted. No focal abnormality in the liver on this study without intravenous contrast. Gallbladder surgically absent. Similar appearance mild extrahepatic biliary duct prominence. Pancreas: No focal mass lesion. No dilatation of the main duct. No intraparenchymal cyst. No peripancreatic edema. Spleen: No splenomegaly. No  focal mass lesion. Adrenals/Urinary Tract: No adrenal nodule or mass. Cortical scarring noted left kidney. No overtly suspicious abnormality in either kidney on this noncontrast study. No evidence for hydroureter. The urinary bladder appears normal for the degree of distention. Stomach/Bowel: Stomach is unremarkable. No gastric wall thickening. No evidence of outlet obstruction. Duodenum is normally positioned as is the ligament of Treitz. No small bowel wall thickening. No small bowel dilatation. The terminal ileum is normal. The appendix is not visualized, but there is no edema or inflammation in the region of the cecum. No gross colonic mass. No colonic wall thickening. Vascular/Lymphatic: There is abdominal aortic atherosclerosis without aneurysm. There is no gastrohepatic or hepatoduodenal ligament lymphadenopathy. No intraperitoneal or retroperitoneal lymphadenopathy. No pelvic sidewall lymphadenopathy. Reproductive: The uterus is surgically absent. There is no adnexal mass. Other: No intraperitoneal free fluid. Musculoskeletal: No worrisome lytic or sclerotic osseous abnormality. Status post T10-12 fusion. Stable appearance superior endplate compression deformity at T12 with stable lucency in the T11 vertebral body IMPRESSION: 1. No acute findings. No findings to explain the patient's history of abdominal pain. 2. Subtle nodularity of liver contour raises the question of but is not diagnostic for cirrhosis. 3.  Aortic Atherosclerois (ICD10-170.0) Electronically Signed   By: Misty Stanley M.D.   On: 02/19/2019 13:25        Scheduled Meds:  amLODipine  10 mg Oral Daily   bisacodyl  10 mg Rectal Once   carvedilol  25 mg Oral BID WC   colchicine  0.6 mg Oral Daily   dicyclomine  20 mg Oral TID AC & HS   docusate sodium  100 mg Oral BID   fentaNYL  1 patch Transdermal Q72H   ferrous sulfate  325 mg Oral Q breakfast   furosemide  40 mg Oral Daily   gabapentin  300 mg Oral TID   heparin  injection (subcutaneous)  5,000 Units Subcutaneous Q8H   insulin aspart  0-9 Units Subcutaneous TID WC   [START ON 02/21/2019] insulin glargine  35 Units Subcutaneous Daily   insulin glargine  5 Units Subcutaneous Once   lidocaine  1 patch Transdermal Q24H   pantoprazole  40 mg Oral Daily   polyethylene glycol  17 g Oral BID   rosuvastatin  20 mg Oral Daily   senna-docusate  2 tablet Oral BID   sertraline  100 mg Oral Daily   sodium chloride flush  3 mL Intravenous Q12H   sodium phosphate  1 enema Rectal Once   Continuous Infusions:  sodium chloride     methocarbamol (ROBAXIN) IV 500 mg (02/20/19 0015)     LOS: 13 days    Time spent: 25 mins.More than 50% of that time was spent in counseling and/or coordination of care.      Shelly Coss, MD [facility] Pager [phone]  If 7PM-7AM, please contact night-coverage [email or web address] Password [email or web address] 02/20/2019, 1:16 PM

## 2019-02-20 NOTE — Plan of Care (Signed)
  Problem: Education: Goal: Knowledge of General Education information will improve Description Including pain rating scale, medication(s)/side effects and non-pharmacologic comfort measures Outcome: Progressing   

## 2019-02-20 NOTE — Progress Notes (Signed)
Patient had quite a bit of pain rating 9/10 each time throughout the night. Did occasional moaning when RN walked in room and called around times PRN pain medications were due. Stated that she had abdominal pain on the right and left side that went to her flank area. Another time she stated that the pain was mainly on her left side. PRN pain medication given throughout night.  Patient did seem to be anxious at times. Did well walking to and from the bathroom. Bowel sounds active and states to have a little tenderness to palpation of abdominal.  Patient seems to be in better spirits this am.

## 2019-02-20 NOTE — Progress Notes (Signed)
Inpatient Diabetes Program Recommendations  AACE/ADA: New Consensus Statement on Inpatient Glycemic Control (2015)  Target Ranges:  Prepandial:   less than 140 mg/dL      Peak postprandial:   less than 180 mg/dL (1-2 hours)      Critically ill patients:  140 - 180 mg/dL   Lab Results  Component Value Date   GLUCAP 232 (H) 02/20/2019   HGBA1C 12.6 (H) 06/14/2016    Review of Glycemic Control Results for ANDREAL, VANDELOO (MRN [id number]) as of 02/20/2019 12:38  Ref. Range 02/19/2019 06:49 02/19/2019 11:34 02/19/2019 16:16 02/19/2019 21:29 02/20/2019 06:33  Glucose-Capillary Latest Ref Range: 70 - 99 mg/dL 185 (H) 182 (H) 231 (H) 269 (H) 234 (H)   Diabetes history: DM 2 Outpatient Diabetes medications: Lantus 105 units QAM and 30 units QHS Current orders for Inpatient glycemic control:  Lantus 30 units Daily Novolog 0-9 units tid  Inpatient Diabetes Program Recommendations:    Increase Lantus to 35 units.  Thanks,  Tama Headings RN, MSN, BC-ADM Inpatient Diabetes Coordinator Team Pager [phone] (8a-5p)

## 2019-02-20 NOTE — Progress Notes (Signed)
OT Cancellation Note  Patient Details Name: Victoria Adams MRN: [id number] DOB: July 21, 1940   Cancelled Treatment:    Reason Eval/Treat Not Completed: Fatigue/lethargy limiting ability to participate;Other (comment) Pt asleep upon arrival. Will check back as schedule allows.  Lanier Clam., COTA/L Acute Rehabilitation Services [phone] [place] 02/20/2019, 3:54 PM

## 2019-02-20 NOTE — Progress Notes (Signed)
Providing Compassionate, Quality Care - Together   Subjective: Patient reports pain is much better controlled this morning. She has been passing some gas and has had two bowel movements since her surgery.  Objective: Vital signs in last 24 hours: Temp:  [98.1 F (36.7 C)-98.9 F (37.2 C)] 98.9 F (37.2 C) (11/10 0700) Pulse Rate:  [75-80] 80 (11/10 0700) Resp:  [16-20] 20 (11/10 0700) BP: (148-179)/(74-91) 179/91 (11/10 0700) SpO2:  [96 %-99 %] 99 % (11/10 0700)  Intake/Output from previous day: 11/09 0701 - 11/10 0700 In: 635.4 [P.O.:600; IV Piggyback:35.4] Out: 6 [Urine:6] Intake/Output this shift: No intake/output data recorded.  Alert and oriented x 4 PERRLA CN II-XII grossly intact MAE, Strength and sensation intact Abdomen soft Incision is covered with Honeycomb dressing and Steri Strips; Dressing is dry and intact with a small amount of dried sanguinous drainage.   Lab Results: No results for input(s): WBC, HGB, HCT, PLT in the last 72 hours. BMET Recent Labs    02/17/19 1056  NA 132*  K 4.5  CL 96*  CO2 25  GLUCOSE 312*  BUN 23  CREATININE 1.14*  CALCIUM 9.0    Studies/Results: Ct Abdomen Pelvis Wo Contrast  Result Date: 02/19/2019 CLINICAL DATA:  Acute generalized abdominal pain. EXAM: CT ABDOMEN AND PELVIS WITHOUT CONTRAST TECHNIQUE: Multidetector CT imaging of the abdomen and pelvis was performed following the standard protocol without IV contrast. COMPARISON:  01/21/2019 FINDINGS: Lower chest: Unremarkable Hepatobiliary: Subtle nodularity of liver contour noted. No focal abnormality in the liver on this study without intravenous contrast. Gallbladder surgically absent. Similar appearance mild extrahepatic biliary duct prominence. Pancreas: No focal mass lesion. No dilatation of the main duct. No intraparenchymal cyst. No peripancreatic edema. Spleen: No splenomegaly. No focal mass lesion. Adrenals/Urinary Tract: No adrenal nodule or mass. Cortical  scarring noted left kidney. No overtly suspicious abnormality in either kidney on this noncontrast study. No evidence for hydroureter. The urinary bladder appears normal for the degree of distention. Stomach/Bowel: Stomach is unremarkable. No gastric wall thickening. No evidence of outlet obstruction. Duodenum is normally positioned as is the ligament of Treitz. No small bowel wall thickening. No small bowel dilatation. The terminal ileum is normal. The appendix is not visualized, but there is no edema or inflammation in the region of the cecum. No gross colonic mass. No colonic wall thickening. Vascular/Lymphatic: There is abdominal aortic atherosclerosis without aneurysm. There is no gastrohepatic or hepatoduodenal ligament lymphadenopathy. No intraperitoneal or retroperitoneal lymphadenopathy. No pelvic sidewall lymphadenopathy. Reproductive: The uterus is surgically absent. There is no adnexal mass. Other: No intraperitoneal free fluid. Musculoskeletal: No worrisome lytic or sclerotic osseous abnormality. Status post T10-12 fusion. Stable appearance superior endplate compression deformity at T12 with stable lucency in the T11 vertebral body IMPRESSION: 1. No acute findings. No findings to explain the patient's history of abdominal pain. 2. Subtle nodularity of liver contour raises the question of but is not diagnostic for cirrhosis. 3.  Aortic Atherosclerois (ICD10-170.0) Electronically Signed   By: Misty Stanley M.D.   On: 02/19/2019 13:25    Assessment/Plan: Patient is five days status post T10-T12 posterior lateral fusion with percutaneous nonsegmental pedicle screw fixation and methylmethacrylate augmentation. She has has issues with mobilization secondary to pain. She is feeling much better this morning. She may mobilize without the brace if she is unable to tolerate it.   LOS: 13 days    -Encourage mobilization -Continue efforts at pain control   Viona Gilmore, DNP, AGNP-C Nurse Practitioner  [facility] [address], [address], [place],  [address] P: [phone]    F: [phone]  02/20/2019, 10:38 AM

## 2019-02-20 NOTE — Progress Notes (Signed)
Physical Therapy Treatment Patient Details Name: Victoria Adams MRN: [id number] DOB: Aug 24, 1940 Today's Date: 02/20/2019    History of Present Illness 78 yo admitted after fall with back pain with compression fx s/p T10-T12 PLIF. PMhx: DM, AFib, CKD, HTN, CHF    PT Comments    Continuing work on functional mobility and activity tolerance;  Saw pt in conjunction with Victoria Adams, [facility], to ensure optimal brace fit; Opted to work with the brace today, but noted per Victoria Aquas, NP with Neurosurgery, "She may mobilize without the brace if she is unable to tolerate it."  Will consider going without brace over next few sessions -- I'm still glad we know we have a good fit if we need to use the brace;   Victoria Adams moves impulsively, and needs 24 hour assist for safety -- which her daughter verified again to me she can provide; will need stair training before DC  Follow Up Recommendations  Home health PT;Supervision/Assistance - 24 hour;Other (comment)(Noted she is a [facility] ACO client - is Victoria Adams an option for her?)     Equipment Recommendations  3in1 (PT)    Recommendations for Other Services       Precautions / Restrictions Precautions Precautions: Back Precaution Comments: Did not remember back precautions; Re-educated Required Braces or Orthoses: Spinal Brace Spinal Brace: Thoracolumbosacral orthotic;Applied in sitting position    Mobility  Bed Mobility Overal bed mobility: Needs Assistance Bed Mobility: Rolling;Sit to Sidelying;Sidelying to Sit Rolling: Min assist Sidelying to sit: Mod assist     Sit to sidelying: Mod assist General bed mobility comments: Cues for technqiue; mod assist to elevate trunk to sit  Transfers Overall transfer level: Needs assistance Equipment used: Rolling walker (2 wheeled) Transfers: Sit to/from Stand Sit to Stand: Min assist         General transfer comment: Min assist to stead and for safety  Ambulation/Gait Ambulation/Gait  assistance: Min assist Gait Distance (Feet): 40 Feet Assistive device: Rolling walker (2 wheeled) Gait Pattern/deviations: Shuffle     General Gait Details: Victoria Adams very much wanted her brace off, and moved impulsively due to wanting to take the brace off; she did walk to the bathroom to void, then back to bed; min assist for safety as she moved fast and impulsively   Stairs             Wheelchair Mobility    Modified Rankin (Stroke Patients Only)       Balance     Sitting balance-Leahy Scale: Fair       Standing balance-Leahy Scale: Poor Standing balance comment: reliant on BUE support                            Cognition Arousal/Alertness: Awake/alert Behavior During Therapy: WFL for tasks assessed/performed Overall Cognitive Status: Within Functional Limits for tasks assessed                                        Exercises      General Comments        Pertinent Vitals/Pain Pain Assessment: Faces Faces Pain Scale: Hurts whole lot Pain Location: back and abdomen Pain Descriptors / Indicators: Aching;Constant;Pressure;Shooting Pain Intervention(s): Monitored during session;Premedicated before session;Repositioned    Home Living  Prior Function            PT Goals (current goals can now be found in the care plan section) Acute Rehab PT Goals Patient Stated Goal: stop the pain PT Goal Formulation: With patient/family Time For Goal Achievement: 03/02/19 Potential to Achieve Goals: Fair Progress towards PT goals: Progressing toward goals    Frequency    Min 4X/week      PT Plan Current plan remains appropriate    Co-evaluation              AM-PAC PT "6 Clicks" Mobility   Outcome Measure  Help needed turning from your back to your side while in a flat bed without using bedrails?: A Little Help needed moving from lying on your back to sitting on the side of a flat bed  without using bedrails?: A Lot Help needed moving to and from a bed to a chair (including a wheelchair)?: A Little Help needed standing up from a chair using your arms (e.g., wheelchair or bedside chair)?: A Little Help needed to walk in hospital room?: A Lot Help needed climbing 3-5 steps with a railing? : A Lot 6 Click Score: 15    End of Session Equipment Utilized During Treatment: Back brace Activity Tolerance: Patient limited by pain Patient left: in bed;with call bell/phone within reach;with family/visitor present Nurse Communication: Mobility status;Other (comment)(better brace fit) PT Visit Diagnosis: Other abnormalities of gait and mobility (R26.89);Muscle weakness (generalized) (M62.81);Difficulty in walking, not elsewhere classified (R26.2)     Time: [id number] PT Time Calculation (min) (ACUTE ONLY): 21 min  Charges:  $Therapeutic Activity: 8-22 mins                     Roney Adams, PT  Acute Rehabilitation Services Pager [phone] Office [place] 02/20/2019, 2:30 PM

## 2019-02-21 LAB — GLUCOSE, CAPILLARY
Glucose-Capillary: 93 mg/dL (ref 70–99)
Glucose-Capillary: 166 mg/dL — ABNORMAL HIGH (ref 70–99)
Glucose-Capillary: 238 mg/dL — ABNORMAL HIGH (ref 70–99)

## 2019-02-21 MED ORDER — FUROSEMIDE 40 MG PO TABS: 80.0000 mg | ORAL_TABLET | Freq: Two times a day (BID) | ORAL | Status: DC

## 2019-02-21 MED ORDER — AMLODIPINE BESYLATE 10 MG PO TABS: 10.0000 mg | ORAL_TABLET | Freq: Every day | ORAL | 0 refills | Status: DC

## 2019-02-21 MED ORDER — LORAZEPAM 0.5 MG PO TABS: 0.5000 mg | ORAL_TABLET | Freq: Three times a day (TID) | ORAL | 0 refills | Status: AC | PRN

## 2019-02-21 MED ORDER — GABAPENTIN 300 MG PO CAPS: 300.0000 mg | ORAL_CAPSULE | Freq: Three times a day (TID) | ORAL | 0 refills | Status: DC

## 2019-02-21 MED ORDER — SENNOSIDES-DOCUSATE SODIUM 8.6-50 MG PO TABS: 2.0000 | ORAL_TABLET | Freq: Two times a day (BID) | ORAL | 0 refills | Status: DC

## 2019-02-21 NOTE — Consult Note (Signed)
   [facility] CM Inpatient Consult   02/21/2019  Victoria Adams 10-12-1940 [id number]   Referral received  to check if potential [place] Management services are needed.  Review of patient's medical record reveals patient is admitted with a fall with a compression fx s/p PLIF of T10 -T-12. Patient with a long length of stay.  Primary Care Provider is Victoria Ponto, MD of [facility].  This office provides the transition of care follow up.  Transportation to provider: daughter  Follow up with inpatient [facility] team RNCM referral for [facility] First program.  Continue to follow progress and disposition to assess for post hospital care management needs.   Plan:  Patient is eligible for [facility] Care Management program with [facility].  Spoke with Victoria Adams who will follow up and confirm.   For questions contact:   Victoria Brood, RN BSN [facility] Liaison  [phone] business mobile phone Toll free office [phone]  Fax number: [phone] [email or web address]

## 2019-02-21 NOTE — Progress Notes (Signed)
Discharge paperwork and instructions given to pt. Pt not in distress and tolerated well. 

## 2019-02-21 NOTE — Progress Notes (Signed)
Providing Compassionate, Quality Care - Together   Subjective: Patient reports significant abdominal pain. Daughter states that the plan is to discharge later today.  Objective: Vital signs in last 24 hours: Temp:  [97.5 F (36.4 C)-98.2 F (36.8 C)] 98.2 F (36.8 C) (11/11 0557) Pulse Rate:  [68-79] 79 (11/11 0557) Resp:  [14] 14 (11/10 1939) BP: (126-134)/(58-68) 134/68 (11/11 0557) SpO2:  [95 %-97 %] 97 % (11/11 0557)  Intake/Output from previous day: 11/10 0701 - 11/11 0700 In: -  Out: 3 [Urine:3] Intake/Output this shift: Total I/O In: 360 [P.O.:360] Out: -   Alert and oriented x 4 PERRLA CN II-XII grossly intact MAE, Strength and sensation intact Abdomen soft Incision is covered with Honeycomb dressing and Steri Strips; Dressing is dry and intact with a small amount of dried sanguinous drainage. Honeycomb dressing removed during assessment.  Lab Results: Recent Labs    02/20/19 1057  WBC 6.6  HGB 13.7  HCT 40.7  PLT 151   BMET Recent Labs    02/20/19 1057  NA 133*  K 3.8  CL 93*  CO2 30  GLUCOSE 268*  BUN 19  CREATININE 1.08*  CALCIUM 8.9   Ct Abdomen Pelvis Wo Contrast  Result Date: 02/19/2019 CLINICAL DATA:  Acute generalized abdominal pain. EXAM: CT ABDOMEN AND PELVIS WITHOUT CONTRAST TECHNIQUE: Multidetector CT imaging of the abdomen and pelvis was performed following the standard protocol without IV contrast. COMPARISON:  01/21/2019 FINDINGS: Lower chest: Unremarkable Hepatobiliary: Subtle nodularity of liver contour noted. No focal abnormality in the liver on this study without intravenous contrast. Gallbladder surgically absent. Similar appearance mild extrahepatic biliary duct prominence. Pancreas: No focal mass lesion. No dilatation of the main duct. No intraparenchymal cyst. No peripancreatic edema. Spleen: No splenomegaly. No focal mass lesion. Adrenals/Urinary Tract: No adrenal nodule or mass. Cortical scarring noted left kidney. No  overtly suspicious abnormality in either kidney on this noncontrast study. No evidence for hydroureter. The urinary bladder appears normal for the degree of distention. Stomach/Bowel: Stomach is unremarkable. No gastric wall thickening. No evidence of outlet obstruction. Duodenum is normally positioned as is the ligament of Treitz. No small bowel wall thickening. No small bowel dilatation. The terminal ileum is normal. The appendix is not visualized, but there is no edema or inflammation in the region of the cecum. No gross colonic mass. No colonic wall thickening. Vascular/Lymphatic: There is abdominal aortic atherosclerosis without aneurysm. There is no gastrohepatic or hepatoduodenal ligament lymphadenopathy. No intraperitoneal or retroperitoneal lymphadenopathy. No pelvic sidewall lymphadenopathy. Reproductive: The uterus is surgically absent. There is no adnexal mass. Other: No intraperitoneal free fluid. Musculoskeletal: No worrisome lytic or sclerotic osseous abnormality. Status post T10-12 fusion. Stable appearance superior endplate compression deformity at T12 with stable lucency in the T11 vertebral body IMPRESSION: 1. No acute findings. No findings to explain the patient's history of abdominal pain. 2. Subtle nodularity of liver contour raises the question of but is not diagnostic for cirrhosis. 3.  Aortic Atherosclerois (ICD10-170.0) Electronically Signed   By: Misty Stanley M.D.   On: 02/19/2019 13:25     Assessment/Plan: Patient is six days status post T10-T12 posterior lateral fusion with percutaneous nonsegmental pedicle screw fixation and methylmethacrylate augmentation. She has has issues with mobilization secondary to pain. She is complaining of abdominal pain, but abdominal CT did not reveal a source. She may mobilize without the brace if she is unable to tolerate it.   LOS: 14 days    -Possible discharge later today -Encourage mobilization  Viona Gilmore, DNP, AGNP-C Nurse  Practitioner  [facility] [place] [address], [place], [place] [address] P: [phone]    F: [phone]  02/21/2019, 3:52 PM

## 2019-02-21 NOTE — TOC Transition Note (Addendum)
Transition of Care [facility]) - CM/SW Discharge Note   Patient Details  Name: Victoria Adams MRN: [id number] Date of Birth: [birth year]  [facility]) CM/SW Contact:  Midge Minium RN, BSN, NCM-BC, ACM-RN [phone] Phone Number: 02/21/2019, 11:52 AM   Clinical Narrative:    Patient is medically stable to transition home. HH arranged with [facility] for [facility] needs; DME arranged with [employer]; patients daughter available to provide 24hr assistance.[facility] consulted for community case management and support. No further needs from CM.    Final next level of care: [place] Barriers to Discharge: No Barriers Identified   Patient Goals and CMS Choice Patient states their goals for this hospitalization and ongoing recovery are:: "to get stronger" CMS Medicare.gov Compare Post Acute Care list provided to:: Patient Choice offered to / list presented to : Patient   Discharge Plan and Services In-house Referral: NA Discharge Planning Services: CM Consult Post Acute Care Choice: Home Health, Durable Medical Equipment          DME Arranged: Bedside commode DME Agency: [employer] Date DME Agency Contacted: 02/16/19 Time DME Agency Contacted: 1620 Representative spoke with at DME Agency: Bertrum Sol (liaison) [place] Arranged: PT [place]: [place] Date [place]: 02/16/19 Time [place]: 1620 Representative spoke with at [place]: [place] (liaison)  Social Determinants of Health ([place]) Interventions     Readmission Risk Interventions No flowsheet data found.

## 2019-02-21 NOTE — Discharge Summary (Signed)
Physician Discharge Summary  Victoria Adams [id number] DOB: [birth year] DOA: 02/07/2019  PCP: Caryl Bis, MD  Admit date: 02/07/2019 Discharge date: 02/21/2019  Admitted From: Home Disposition:  Home  Discharge Condition:Stable CODE STATUS:FULL Diet recommendation: Heart Healthy   Brief/Interim Summary: Patient is a 78 year old female with history of hypertension, diabetes, systolic congestive heart failure, proximal A. Fib who presents with fall about 2 weeks ago, increased back pain since then.  Imaging on presentation showed worsening of compression fracture of thoracic spine concerning for unstable fracture.  Neurosurgery consulted.  Underwent  T10-T12 posterior lateral fusion with percutaneous nonsegmental pedicle screw fixation and methylmethacrylate augmentation.POD3 .  PT/OT recommended home health.   Hospital course remarkable for continous abdominal of unknown origin , back pain.  Her abdomen pain was most likely due to referred pain from recent surgery on the back.  Her abdomen is soft, nondistended, has good bowel sounds and she is having bowel movement.  I discussed with the patient and the daughter about the discharge plan.  She is hemodynamically stable for discharge to home today.  Following problems were addressed during her hospitalization:  T10-T11 thoracic compression fracture: Presented with severe back pain.  Neurosurgery has been  following. Underwent  T10-T12 posterior lateral fusion with percutaneous nonsegmental pedicle screw fixation and methylmethacrylate augmentation.    PT/OT consultation done.Recommended hHPT. Back pain has improved.  Follow-up with neurosurgery as an outpatient.  Abdominal pain: Complained of  abdominal  Discomfort on the bilateral sides.  Abdominal x-ray few days ago showed dilated loop of likely large bowel in the left hemiabdomen suggesting ileus .  Abdomen is nondistended, nontender, has good bowel sounds.  Abdominal pain is  disproportionate to the examination. CT abdomen with oral contrast did not show any acute intra-abdominal abnormalities.  I also discussed with general surgery and did not have any reason for the consultation and recommended gabapentin because of possible referred neuropathic pain from the recent back surgery. We did encourage ambulation, continue aggressive bowel regimen.   She has been having  bowel movements.  She is also very anxious and this cud have contributed on this also.  Bilateral lower extremity edema/history of systolic CHF: Last echo in clinic in with normal left ventricular dysfunction and normal right ventricular function, she might have left ventricular recovery with CRT device.  Denies any shortness of breath.   She is on Lasix 80 mg twice a day at home, on spironolactone, on entresto.  Echocardiogram done on 10/29 showed ejection fraction of 60-65%. She may not need Entresto or Aldactone because her ejection fraction is recovered.  She needs to follow-up with cardiology as an outpatient.  Continue Lasix 40 mg twice a day at home.  Hypertension: Continue current medicines.  Paroxysmal A. fib: Rate is controlled.  Continue Coreg,  Eliquis .  Diabetes mellitus: Continue home regimen  CKD stage III: Currently kidney function is at baseline.Check BMP in a week.  Morbid obesity: BMI of 40   Discharge Diagnoses:  Principal Problem:   Thoracic compression fracture [facility]) Active Problems:   Essential hypertension, benign   Atrial fibrillation (HCC)   Type 2 diabetes mellitus with complication (HCC)   Dyspnea   Back pain    Discharge Instructions  Discharge Instructions    Diet - low sodium heart healthy   Complete by: As directed    Discharge instructions   Complete by: As directed    1)Please follow-up with your PCP in a week.  Do a BMP test  during the follow-up 2)Follow up with home health. 3)Take prescribed medications as instructed. 4)Follow up with  neurosurgery in 2 weeks.  Name and number the provider has been attached. 5)Follow up with your cardiologist in 2 weeks.   Increase activity slowly   Complete by: As directed      Allergies as of 02/21/2019      Reactions   Codeine Nausea And Vomiting   Tylox [oxycodone-acetaminophen]    Vision closing in & heart palpitations.       Medication List    STOP taking these medications   Entresto 24-26 MG Generic drug: sacubitril-valsartan     TAKE these medications   amLODipine 10 MG tablet Commonly known as: NORVASC Take 1 tablet (10 mg total) by mouth daily. Start taking on: February 22, 2019   CALCIUM 600 + D PO Take 1 tablet by mouth 2 (two) times daily.   carvedilol 25 MG tablet Commonly known as: COREG TAKE (1) TABLET TWICE DAILY. What changed: See the new instructions.   colchicine 0.6 MG tablet Take 1 tablet (0.6 mg total) by mouth daily.   dimenhyDRINATE 50 MG tablet Commonly known as: DRAMAMINE Take 50 mg by mouth every 8 (eight) hours as needed for nausea.   diphenhydrAMINE 25 MG tablet Commonly known as: BENADRYL Take 25 mg by mouth daily as needed for itching.   doxycycline 100 MG capsule Commonly known as: VIBRAMYCIN Take 100-200 mg by mouth 2 (two) times daily as needed (rosacea).   Eliquis 5 MG Tabs tablet Generic drug: apixaban TAKE (1) TABLET TWICE DAILY. What changed: See the new instructions.   ferrous sulfate 325 (65 FE) MG tablet TAKE 1 TABLET ONCE DAILY WITH BREAKFAST.   furosemide 40 MG tablet Commonly known as: LASIX Take 2 tablets (80 mg total) by mouth 2 (two) times daily. What changed: medication strength   gabapentin 300 MG capsule Commonly known as: NEURONTIN Take 1 capsule (300 mg total) by mouth 3 (three) times daily.   HYDROcodone-acetaminophen 10-325 MG tablet Commonly known as: NORCO Take 1 tablet by mouth every 6 (six) hours as needed for moderate pain (To be taken 3 hours after Dilaudid, if Dilaudid is  ineffective).   HYDROmorphone 2 MG tablet Commonly known as: Dilaudid Take 1 tablet (2 mg total) by mouth every 4 (four) hours as needed for severe pain.   insulin glargine 100 UNIT/ML injection Commonly known as: LANTUS Inject 0.6-1 mLs (60-100 Units total) into the skin 2 (two) times daily. 100 units in the morning and 60 units in the evening What changed:   how much to take  when to take this  additional instructions   LORazepam 0.5 MG tablet Commonly known as: Ativan Take 1 tablet (0.5 mg total) by mouth every 8 (eight) hours as needed for anxiety.   metolazone 2.5 MG tablet Commonly known as: ZAROXOLYN Take 2.5 mg by mouth daily as needed (for fluid retention resulting in 5lb weight gain).   omeprazole 20 MG capsule Commonly known as: PRILOSEC Take 20 mg by mouth 2 (two) times daily before a meal.   ondansetron 4 MG disintegrating tablet Commonly known as: Zofran ODT Take 1 tablet (4 mg total) by mouth every 8 (eight) hours as needed for nausea or vomiting.   polyethylene glycol 17 g packet Commonly known as: MIRALAX / GLYCOLAX Take 17 g by mouth daily.   potassium chloride SA 20 MEQ tablet Commonly known as: KLOR-CON Take 1 tablet (20 mEq total) by mouth daily. What changed:  when to take this  reasons to take this   rosuvastatin 20 MG tablet Commonly known as: CRESTOR TAKE 1 TABLET ONCE DAILY.   senna-docusate 8.6-50 MG tablet Commonly known as: Senokot-S Take 2 tablets by mouth 2 (two) times daily.   sertraline 100 MG tablet Commonly known as: ZOLOFT Take 100 mg by mouth daily.            Durable Medical Equipment  (From admission, onward)         Start     Ordered   02/21/19 1111  For home use only DME Walker  Once    Question:  Patient needs a walker to treat with the following condition  Answer:  Balance disorder   02/21/19 1110         Follow-up Information    [facility] Follow up.   Specialty: Home Health  Services Why: physical therapy Contact information: [place] [address] [phone]        [facility] Follow up.   Why: bedside commode Contact information: [place] [address] [phone]        Caryl Bis, MD. Schedule an appointment as soon as possible for a visit in 1 week(s).   Specialty: Family Medicine Contact information: [place] [address] [phone]        Ashok Pall, MD. Schedule an appointment as soon as possible for a visit in 2 week(s).   Specialty: Neurosurgery Contact information: [address] [place] [address] [phone]        [name], Shaune Pascal, MD. Schedule an appointment as soon as possible for a visit in 2 week(s).   Specialty: Cardiology Contact information: [place] [address] [phone]          Allergies  Allergen Reactions  . Codeine Nausea And Vomiting  . Tylox [Oxycodone-Acetaminophen]     Vision closing in & heart palpitations.     Consultations:  Neurosurgery   Procedures/Studies: Ct Abdomen Pelvis Wo Contrast  Result Date: 02/19/2019 CLINICAL DATA:  Acute generalized abdominal pain. EXAM: CT ABDOMEN AND PELVIS WITHOUT CONTRAST TECHNIQUE: Multidetector CT imaging of the abdomen and pelvis was performed following the standard protocol without IV contrast. COMPARISON:  01/21/2019 FINDINGS: Lower chest: Unremarkable Hepatobiliary: Subtle nodularity of liver contour noted. No focal abnormality in the liver on this study without intravenous contrast. Gallbladder surgically absent. Similar appearance mild extrahepatic biliary duct prominence. Pancreas: No focal mass lesion. No dilatation of the main duct. No intraparenchymal cyst. No peripancreatic edema. Spleen: No splenomegaly. No focal mass lesion. Adrenals/Urinary Tract: No adrenal nodule or mass. Cortical scarring noted left kidney.  No overtly suspicious abnormality in either kidney on this noncontrast study. No evidence for hydroureter. The urinary bladder appears normal for the degree of distention. Stomach/Bowel: Stomach is unremarkable. No gastric wall thickening. No evidence of outlet obstruction. Duodenum is normally positioned as is the ligament of Treitz. No small bowel wall thickening. No small bowel dilatation. The terminal ileum is normal. The appendix is not visualized, but there is no edema or inflammation in the region of the cecum. No gross colonic mass. No colonic wall thickening. Vascular/Lymphatic: There is abdominal aortic atherosclerosis without aneurysm. There is no gastrohepatic or hepatoduodenal ligament lymphadenopathy. No intraperitoneal or retroperitoneal lymphadenopathy. No pelvic sidewall lymphadenopathy. Reproductive: The uterus is surgically absent. There is no adnexal mass. Other: No intraperitoneal free fluid. Musculoskeletal:  No worrisome lytic or sclerotic osseous abnormality. Status post T10-12 fusion. Stable appearance superior endplate compression deformity at T12 with stable lucency in the T11 vertebral body IMPRESSION: 1. No acute findings. No findings to explain the patient's history of abdominal pain. 2. Subtle nodularity of liver contour raises the question of but is not diagnostic for cirrhosis. 3.  Aortic Atherosclerois (ICD10-170.0) Electronically Signed   By: Misty Stanley M.D.   On: 02/19/2019 13:25   Dg Chest 2 View  Result Date: 02/07/2019 CLINICAL DATA:  CHF EXAM: CHEST - 2 VIEW COMPARISON:  11/19/2016 FINDINGS: Cardiomegaly with left chest multi lead pacer defibrillator. Both lungs are clear. Disc degenerative disease of the thoracic spine. IMPRESSION: Cardiomegaly without acute abnormality of the lungs. No focal airspace opacity. Electronically Signed   By: Eddie Candle M.D.   On: 02/07/2019 17:00   Dg Thoracic Spine 2 View  Result Date: 02/15/2019 CLINICAL DATA:  T10-T12 fusion.  EXAM: THORACIC SPINE 2 VIEWS; DG C-ARM 1-60 MIN COMPARISON:  CT thoracic spine dated February 08, 2019. FLUOROSCOPY TIME:  2 minutes, 11 seconds C-arm fluoroscopic images were obtained intraoperatively and submitted for post operative interpretation. FINDINGS: Frontal and lateral intraoperative fluoroscopic images demonstrate interval T10-T12 posterior fusion with pedicle screws at T10 and T12. There is a small amount of cement surrounding the pedicle screws within the vertebral bodies. IMPRESSION: 1. Intraoperative fluoroscopic guidance for T10-T12 posterior fusion. Electronically Signed   By: Titus Dubin M.D.   On: 02/15/2019 14:22   Dg Abd 1 View  Result Date: 02/17/2019 CLINICAL DATA:  Hx of spinal surgery on 02/15/19. Pt complains of abdominal pain and cramping. EXAM: ABDOMEN - 1 VIEW COMPARISON:  CT abdomen pelvis 01/21/2019 FINDINGS: There is at least one dilated loop of likely large bowel in the left hemiabdomen. Gas is seen to the level of the rectum. No definite dilated loops of small bowel. No supine evidence for free air. No unexpected calcification. Spinal hardware noted as well as partially visualized chest ICD lead. IMPRESSION: Dilated loop of likely large bowel in the left hemiabdomen may represent ileus. No definite dilated loops of small bowel to suggest obstruction. Electronically Signed   By: Audie Pinto M.D.   On: 02/17/2019 11:43   Ct Angio Chest Pe W And/or Wo Contrast  Result Date: 02/07/2019 CLINICAL DATA:  PE suspected, high pretest probability, history of CHF EXAM: CT ANGIOGRAPHY CHEST WITH CONTRAST TECHNIQUE: Multidetector CT imaging of the chest was performed using the standard protocol during bolus administration of intravenous contrast. Multiplanar CT image reconstructions and MIPs were obtained to evaluate the vascular anatomy. CONTRAST:  141mL OMNIPAQUE IOHEXOL 350 MG/ML SOLN COMPARISON:  Chest radiograph 02/07/2019, CT abdomen pelvis 01/21/2019 FINDINGS:  Cardiovascular: Satisfactory opacification of the pulmonary arteries to the segmental level. No pulmonary arterial filling defects are identified. Borderline enlargement of the central pulmonary arteries. RV/LV ratio is normal (0.6) mild cardiomegaly with predominantly right atrial enlargement. Reflux of contrast into the hepatic veins. Extensive coronary artery calcifications are present. Cardiac pacer/defibrillator pack overlies the left chest wall. Defibrillation lead position at the cardiac apex. Additional leads at the right atrium and coronary sinus. Limited opacification of the aorta. Ascending thoracic aorta is upper limits of normal caliber. Atherosclerotic plaque throughout the aorta and proximal great vessels which are otherwise unremarkable. Mild distension of the azygous veins. Mediastinum/Nodes: Thyroid gland and thoracic inlet are unremarkable. No acute abnormality of the trachea or esophagus. No mediastinal, hilar axillary adenopathy. Lungs/Pleura: Bandlike areas of scarring are present  in the lingula and lower lobes. No consolidation, features of edema, pneumothorax, or effusion. No suspicious pulmonary nodules or masses. Upper Abdomen: Patient is post cholecystectomy. Small accessory splenules are noted. Left renal atrophy is present. Extensive vascular calcium noted in the upper abdomen. Musculoskeletal: Multilevel flowing anterior osteophytosis, compatible with features of diffuse idiopathic skeletal hyperostosis (DISH). There is irregular lucency through the inferior endplate T10 and superior endplate T11 concerning for acute fracture deformities at these of levels. There is osseous extension through the posterior tension band with distracted fracture of the spinous process of T10 and lucency seen the posterior elements of T11. Question additional lucency through the tips of the spinous processes of T7 and T8 as well. There is stable superior endplate cupping at 624THL. No other acute or suspicious  osseous lesions. Bones are diffusely demineralized. Review of the MIP images confirms the above findings. IMPRESSION: 1. No evidence of pulmonary embolism. Borderline enlargement of the central pulmonary arteries, which can be seen with pulmonary arterial hypertension. 2. Evidence of right-sided heart failure with right atrial enlargement and reflux of contrast into the hepatic veins. 3. Acute interval worsening of the known fracture deformities of the inferior endplate T10 and superior endplate T11 with now with transosseous extension through the posterior tension band with distracted fracture of the spinous process of T10 and lucency seen the posterior elements of T11. Further evaluation with MRI is recommended. 4. Question additional nondisplaced spinous process fractures of T7 and T8. 5. Stable superior endplate compression deformity at T12. 6. Aortic Atherosclerosis (ICD10-I70.0). These results were called by telephone at the time of interpretation on 02/07/2019 at 7:40 pm to provider Davonna Belling , who verbally acknowledged these results. Electronically Signed   By: Lovena Le M.D.   On: 02/07/2019 19:43   Ct Thoracic Spine Wo Contrast  Result Date: 02/08/2019 CLINICAL DATA:  78 year old female status post fall. Suspected T10-T11 fracture in the setting of ankylosis. EXAM: CT THORACIC SPINE WITHOUT CONTRAST TECHNIQUE: Multidetector CT images of the thoracic were obtained using the standard protocol without intravenous contrast. COMPARISON:  CTA chest 02/07/2019. FINDINGS: Limited cervical spine imaging: Bulky anterior C6-C7 osteophyte which might fully bridge the disc space. Cervicothoracic junction alignment is within normal limits. Thoracic spine segmentation:  Normal. Alignment: Stable since yesterday. Minor chronic appearing retrolisthesis of T11 on T12 with relatively preserved thoracic kyphosis. Vertebrae: Osteopenia. Flowing endplate osteophytes in the thoracic spine from T3 into the upper  lumbar spine resulting in ankylosis. Mild T1 superior endplate irregularity is probably chronic. The T1 through T9 vertebral bodies appear intact. Comminuted fracture through the T10 inferior endplate eccentric to the left (series 5, image 121) with mild endplate compression. The T10 pedicles appear to remain intact. No T10 lamina or facet fracture is identified although there is a fracture of the T10 spinous process on series 9, image 32 which corresponds to the level of the disc space. Associated comminuted horizontal fracture through the T11 superior endplate with mild compression. There appears to be a vertically oriented fracture across the junction of the right T11 pedicle and body on sagittal image 24. But otherwise the T11 pedicles and posterior elements appear intact. T12 superior endplate compression is probably chronic. There is vacuum phenomena associated with a T12-L1 anterior endplate osteophyte. The L1 level appears to be intact. No other acute thoracic vertebral fracture is identified; no definite acute thoracic spinous process fracture elsewhere. Posterior ribs appear to remain intact. Paraspinal and other soft tissues: Mild prevertebral and paraspinal soft tissue  edema and/or hematoma at T10 and T11 (series 4, image 119). Trace layering pleural fluid. Otherwise stable from the chest CTA yesterday. Calcified coronary artery atherosclerosis and/or stents. Calcified aortic atherosclerosis. Abdominal calcified arterial atherosclerosis. Intravenous contrast being excreted from both kidneys. Disc levels: Degenerative appearing spinal stenosis at T10-T11 which might be exacerbated by the superimposed acute fracture. Posterior disc osteophyte complex plus mild to moderate facet hypertrophy. Trace vacuum facet. AP thecal sac estimated at 5-6 millimeters, mild to moderate spinal stenosis. Moderate to severe bilateral T10 foraminal stenosis which is greater on the left and appears to be at least in part  related to the acute trauma. Thoracic spine degeneration elsewhere. Mild degenerative spinal stenosis suspected at T6-T7. IMPRESSION: 1. Acute fractures re-demonstrated at T10 and T11 in the setting of underlying ankylosis. Comminution of the T10 inferior and T11 superior endplates with fracture extension through the T10 spinous process. Fracture at the anterior junction of the right T11 pedicle and body, but otherwise no pedicle or lamina fracture identified. 2. Up to moderate multifactorial spinal stenosis and severe foraminal stenosis at T10-T11 which appears exacerbated by the acute trauma. 3. Superimposed age indeterminate superior endplate compression at T12 and T1. 4. Osteopenia.  Diffuse idiopathic skeletal hyperostosis (DISH). 5. Trace pleural fluid. Electronically Signed   By: Genevie Ann M.D.   On: 02/08/2019 10:23   Dg Thoracic Spine 1vclearing  Result Date: 02/09/2019 CLINICAL DATA:  Thoracic compression fracture. EXAM: Standing AP and lateral views of the thoracic spine. COMPARISON:  CT scan of the thoracic spine dated 02/08/2019 FINDINGS: In the standing position there is marked increase of the compression of the superior aspect of T11 as compared to the CT scan of 02/08/2019. There is slight increased compression of the anterior inferior aspect of T11 with standing. The compression of T12 appears unchanged. The thoracic kyphosis is accentuated in the upright position. IMPRESSION: 1. Increased compression of the superior aspect of T11 in the upright position as compared to the CT scan of 02/08/2019. 2. Slight increased compression of the anterior inferior aspect of T11 with standing. 3. Stable compression of T12. Electronically Signed   By: Lorriane Shire M.D.   On: 02/09/2019 11:15   Dg C-arm 1-60 Min  Result Date: 02/15/2019 CLINICAL DATA:  T10-T12 fusion. EXAM: THORACIC SPINE 2 VIEWS; DG C-ARM 1-60 MIN COMPARISON:  CT thoracic spine dated February 08, 2019. FLUOROSCOPY TIME:  2 minutes, 11  seconds C-arm fluoroscopic images were obtained intraoperatively and submitted for post operative interpretation. FINDINGS: Frontal and lateral intraoperative fluoroscopic images demonstrate interval T10-T12 posterior fusion with pedicle screws at T10 and T12. There is a small amount of cement surrounding the pedicle screws within the vertebral bodies. IMPRESSION: 1. Intraoperative fluoroscopic guidance for T10-T12 posterior fusion. Electronically Signed   By: Titus Dubin M.D.   On: 02/15/2019 14:22       Subjective:  Patient seen and examined the bedside this morning.  Hemodynamically stable for discharge.  Was having some abdominal discomfort this morning but her abdomen is soft, nontender, nondistended and she had a bowel movement this morning.  I discussed the discharge planning with the daughter on phone.  Discharge Exam: Vitals:   02/20/19 1939 02/21/19 0557  BP: (!) 126/58 134/68  Pulse: 68 79  Resp: 14   Temp: (!) 97.5 F (36.4 C) 98.2 F (36.8 C)  SpO2: 95% 97%   Vitals:   02/20/19 0700 02/20/19 1531 02/20/19 1939 02/21/19 0557  BP: (!) 179/91 (!) 162/76 (!) 126/58  134/68  Pulse: 80 71 68 79  Resp: 20 18 14    Temp: 98.9 F (37.2 C) 97.8 F (36.6 C) (!) 97.5 F (36.4 C) 98.2 F (36.8 C)  TempSrc: Oral Oral Oral   SpO2: 99% 97% 95% 97%  Weight:      Height:        General: Pt is alert, awake, not in acute distress, anxious Cardiovascular: RRR, S1/S2 +, no rubs, no gallops Respiratory: CTA bilaterally, no wheezing, no rhonchi Abdominal: Soft, NT, ND, bowel sounds + Extremities: no edema, no cyanosis    The results of significant diagnostics from this hospitalization (including imaging, microbiology, ancillary and laboratory) are listed below for reference.     Microbiology: No results found for this or any previous visit (from the past 240 hour(s)).   Labs: BNP (last 3 results) Recent Labs    02/07/19 1644  BNP [id number]*   Basic Metabolic  Panel: Recent Labs  Lab 02/16/19 0428 02/17/19 1056 02/20/19 1057  NA 134* 132* 133*  K 5.4* 4.5 3.8  CL 93* 96* 93*  CO2 26 25 30   GLUCOSE 306* 312* 268*  BUN 24* 23 19  CREATININE 1.44* 1.14* 1.08*  CALCIUM 8.9 9.0 8.9   Liver Function Tests: No results for input(s): AST, ALT, ALKPHOS, BILITOT, PROT, ALBUMIN in the last 168 hours. No results for input(s): LIPASE, AMYLASE in the last 168 hours. No results for input(s): AMMONIA in the last 168 hours. CBC: Recent Labs  Lab 02/16/19 0428 02/20/19 1057  WBC 10.3 6.6  NEUTROABS 8.7* 4.5  HGB 14.2 13.7  HCT 45.0 40.7  MCV 93.9 88.7  PLT 157 151   Cardiac Enzymes: No results for input(s): CKTOTAL, CKMB, CKMBINDEX, TROPONINI in the last 168 hours. BNP: Invalid input(s): POCBNP CBG: Recent Labs  Lab 02/20/19 0633 02/20/19 1131 02/20/19 1652 02/20/19 2139 02/21/19 0626  GLUCAP 234* 232* 258* 138* 93   D-Dimer No results for input(s): DDIMER in the last 72 hours. Hgb A1c No results for input(s): HGBA1C in the last 72 hours. Lipid Profile No results for input(s): CHOL, HDL, LDLCALC, TRIG, CHOLHDL, LDLDIRECT in the last 72 hours. Thyroid function studies No results for input(s): TSH, T4TOTAL, T3FREE, THYROIDAB in the last 72 hours.  Invalid input(s): FREET3 Anemia work up No results for input(s): VITAMINB12, FOLATE, FERRITIN, TIBC, IRON, RETICCTPCT in the last 72 hours. Urinalysis    Component Value Date/Time   COLORURINE YELLOW 06/13/2016 1601   APPEARANCEUR CLEAR 06/13/2016 1601   LABSPEC 1.010 06/13/2016 1601   PHURINE 5.0 06/13/2016 1601   GLUCOSEU 150 (A) 06/13/2016 1601   HGBUR NEGATIVE 06/13/2016 1601   BILIRUBINUR NEGATIVE 06/13/2016 1601   KETONESUR NEGATIVE 06/13/2016 1601   PROTEINUR NEGATIVE 06/13/2016 1601   UROBILINOGEN 1.0 01/10/2014 1545   NITRITE NEGATIVE 06/13/2016 1601   LEUKOCYTESUR NEGATIVE 06/13/2016 1601   Sepsis Labs Invalid input(s): PROCALCITONIN,  WBC,   LACTICIDVEN Microbiology No results found for this or any previous visit (from the past 240 hour(s)).  Please note: You were cared for by a hospitalist during your hospital stay. Once you are discharged, your primary care physician will handle any further medical issues. Please note that NO REFILLS for any discharge medications will be authorized once you are discharged, as it is imperative that you return to your primary care physician (or establish a relationship with a primary care physician if you do not have one) for your post hospital discharge needs so that they can reassess your need for medications  and monitor your lab values.    Time coordinating discharge: 40 minutes  SIGNED:   Shelly Coss, MD  [facility] 02/21/2019, 11:19 AM Pager [phone]  If 7PM-7AM, please contact night-coverage [email or web address] Password [email or web address]

## 2019-02-21 NOTE — Progress Notes (Signed)
Patient drowsy at the beginning of night shift 11/10 & throughout night. Night gabapentin held. Patient did not ask or receive any PRN pain medication.

## 2019-02-21 NOTE — Progress Notes (Signed)
Occupational Therapy Treatment Patient Details Name: Victoria Adams MRN: [id number] DOB: 14-Dec-1940 Today's Date: 02/21/2019    History of present illness 78 yo admitted after fall with back pain with compression fx s/p T10-T12 PLIF. PMhx: DM, AFib, CKD, HTN, CHF   OT comments  Pt making minimal progress towards OT goals this session. Session focus on functional mobility as precursor to higher level ADLs. Pt with 10/10 pain throughout session but agreeable to limited mobility. Pt agreeable to don brace at EOB with MAX A. Education provided to daughter and pt about wearing schedule and how to don from EOB. Pt sit>stand from EOB with RW and min guard assist. Pt completed minimal functional mobility in room with RW and MIN A for safety as pt slightly impulsive before reporting, "I need to sit down." Reviewed back precautions handout with pt and daughter and provided functional examples to assist with maintaining back precautions at home. Pt tends to become impulsive during movement d/t pain. Education provided on slowing down to maintain safety. DC plan remains appropriate, will continue to follow acutely per POC.    Follow Up Recommendations  Home health OT;Supervision/Assistance - 24 hour    Equipment Recommendations  3 in 1 bedside commode    Recommendations for Other Services      Precautions / Restrictions Precautions Precautions: Back Precaution Booklet Issued: Yes (comment) Precaution Comments: able to recall, "do not twist" Required Braces or Orthoses: Spinal Brace Spinal Brace: Thoracolumbosacral orthotic;Applied in sitting position Restrictions Weight Bearing Restrictions: No       Mobility Bed Mobility Overal bed mobility: Needs Assistance Bed Mobility: Rolling;Sidelying to Sit Rolling: Min guard Sidelying to sit: Min assist       General bed mobility comments: MIN A from therapist to elevate trunk into sitting; pt able to roll with min guard, increased time and  effort as pt limited by 10/10 pain  Transfers Overall transfer level: Needs assistance Equipment used: Rolling walker (2 wheeled) Transfers: Sit to/from Stand Sit to Stand: Min guard;+2 safety/equipment         General transfer comment: close min guard +2 for sit>stand with RW, cues for hand placement on RW    Balance Overall balance assessment: Needs assistance Sitting-balance support: No upper extremity supported;Feet supported Sitting balance-Leahy Scale: Fair     Standing balance support: Bilateral upper extremity supported Standing balance-Leahy Scale: Poor Standing balance comment: reliant on BUE support                           ADL either performed or assessed with clinical judgement   ADL Overall ADL's : Needs assistance/impaired                 Upper Body Dressing : Maximal assistance;Sitting Upper Body Dressing Details (indicate cue type and reason): MAX A to don brace EOB     Toilet Transfer: Minimal assistance;Ambulation;RW Toilet Transfer Details (indicate cue type and reason): simulated to recliner; MIN A +2 for safety and MAX encouragment, pt limited by 10/10 pain       Tub/Shower Transfer Details (indicate cue type and reason): daughter reports shower seat in walk in shower Functional mobility during ADLs: Minimal assistance;Rolling walker;+2 for safety/equipment General ADL Comments: education on back precautions with pt and daughter and provided strategies to assist with incorporating back precautions into ADL routine, pt agreeable to functional mobility within RW and transferring to recliner with RW and MIN A +2 for safety  Vision Patient Visual Report: No change from baseline     Perception     Praxis      Cognition Arousal/Alertness: Awake/alert Behavior During Therapy: WFL for tasks assessed/performed;Agitated Overall Cognitive Status: Within Functional Limits for tasks assessed                                  General Comments: pt heavily affected by pain making pt agitated during session        Exercises     Shoulder Instructions       General Comments daughter present thoughout session, daughter states she will be at home with her to assist with ADLs and functional mobility, education provided to daughter and pt about incorporating back precautions into ADL routine as well as brace managment and wearing schedule    Pertinent Vitals/ Pain       Pain Assessment: 0-10 Pain Score: 10-Worst pain ever Pain Location: back and stomach Pain Descriptors / Indicators: Aching;Constant;Pressure;Discomfort;Crying;Moaning Pain Intervention(s): Monitored during session;Limited activity within patient's tolerance;Repositioned  Home Living                                          Prior Functioning/Environment              Frequency  Min 2X/week        Progress Toward Goals  OT Goals(current goals can now be found in the care plan section)  Progress towards OT goals: Progressing toward goals  Acute Rehab OT Goals Patient Stated Goal: stop the pain OT Goal Formulation: With patient Time For Goal Achievement: 03/04/19 Potential to Achieve Goals: Good  Plan Discharge plan remains appropriate    Co-evaluation    PT/OT/SLP Co-Evaluation/Treatment: Yes Reason for Co-Treatment: For patient/therapist safety;To address functional/ADL transfers   OT goals addressed during session: ADL's and self-care      AM-PAC OT "6 Clicks" Daily Activity     Outcome Measure   Help from another person eating meals?: None Help from another person taking care of personal grooming?: A Little Help from another person toileting, which includes using toliet, bedpan, or urinal?: A Lot Help from another person bathing (including washing, rinsing, drying)?: A Lot Help from another person to put on and taking off regular upper body clothing?: A Little Help from another person to put  on and taking off regular lower body clothing?: A Lot 6 Click Score: 16    End of Session Equipment Utilized During Treatment: Rolling walker;Gait belt;Back brace;Other (comment)(TLSO)  OT Visit Diagnosis: Unsteadiness on feet (R26.81);Other abnormalities of gait and mobility (R26.89);Muscle weakness (generalized) (M62.81);Pain   Activity Tolerance Patient limited by pain;Patient tolerated treatment well   Patient Left in chair;with call bell/phone within reach;with chair alarm set;with family/visitor present   Nurse Communication          Time: 1010-1033 OT Time Calculation (min): 23 min  Charges: OT General Charges $OT Visit: 1 Visit OT Treatments $Therapeutic Activity: 8-22 mins  Lanier Clam., COTA/L Acute Rehabilitation Services [phone]    Ihor Gully 02/21/2019, 12:04 PM

## 2019-02-21 NOTE — Progress Notes (Signed)
Physical Therapy Treatment Patient Details Name: Victoria Adams MRN: [id number] DOB: Oct 18, 1940 Today's Date: 02/21/2019    History of Present Illness 78 yo admitted after fall with back pain with compression fx s/p T10-T12 PLIF. PMhx: DM, AFib, CKD, HTN, CHF    PT Comments    Pt tolerated brace today with short distance gait.  She has a high level of pain, but moves well, but needs cyes for safety.  Con't to recommend home with HHPT and family support.   Follow Up Recommendations  Home health PT;Supervision/Assistance - 24 hour;Other (comment)     Equipment Recommendations  3in1 (PT)    Recommendations for Other Services       Precautions / Restrictions Precautions Precautions: Back Precaution Booklet Issued: Yes (comment) Precaution Comments: Precautions reviewed Required Braces or Orthoses: Spinal Brace Spinal Brace: Thoracolumbosacral orthotic;Applied in sitting position Restrictions Weight Bearing Restrictions: No    Mobility  Bed Mobility Overal bed mobility: Needs Assistance Bed Mobility: Rolling;Sidelying to Sit Rolling: Min guard Sidelying to sit: Min assist       General bed mobility comments: Pt able to roll with min/guard, but needing MIN A to elevate trunk  Transfers Overall transfer level: Needs assistance Equipment used: Rolling walker (2 wheeled) Transfers: Sit to/from Stand Sit to Stand: Min guard;+2 safety/equipment         General transfer comment: close min guard +2 for sit>stand with RW, cues for hand placement on RW  Ambulation/Gait Ambulation/Gait assistance: Min guard;+2 safety/equipment Gait Distance (Feet): 20 Feet Assistive device: Rolling walker (2 wheeled) Gait Pattern/deviations: Decreased step length - right;Decreased step length - left     General Gait Details: Amb in room only with brace intact, but unable to ambulate farther due to pain   Stairs Stairs: (declined, but PT reviewed safe technique with pt and daughte)            Wheelchair Mobility    Modified Rankin (Stroke Patients Only)       Balance Overall balance assessment: Needs assistance Sitting-balance support: No upper extremity supported;Feet supported Sitting balance-Leahy Scale: Fair     Standing balance support: Bilateral upper extremity supported Standing balance-Leahy Scale: Poor Standing balance comment: reliant on BUE support                            Cognition Arousal/Alertness: Awake/alert Behavior During Therapy: WFL for tasks assessed/performed;Agitated Overall Cognitive Status: Within Functional Limits for tasks assessed                                 General Comments: Pt at times getting agitated due to pain.      Exercises      General Comments General comments (skin integrity, edema, etc.): Donned brace in sitting      Pertinent Vitals/Pain Pain Assessment: 0-10 Pain Score: 10-Worst pain ever Pain Location: back and stomach Pain Descriptors / Indicators: Aching;Constant;Pressure;Discomfort;Crying;Moaning Pain Intervention(s): Limited activity within patient's tolerance;Monitored during session;Repositioned;Premedicated before session;Relaxation    Home Living                      Prior Function            PT Goals (current goals can now be found in the care plan section) Acute Rehab PT Goals Patient Stated Goal: stop the pain PT Goal Formulation: With patient/family Time For Goal Achievement: 03/02/19 Potential  to Achieve Goals: Fair Progress towards PT goals: Progressing toward goals    Frequency    Min 4X/week      PT Plan Current plan remains appropriate    Co-evaluation PT/OT/SLP Co-Evaluation/Treatment: Yes Reason for Co-Treatment: For patient/therapist safety PT goals addressed during session: Mobility/safety with mobility;Proper use of DME OT goals addressed during session: ADL's and self-care      AM-PAC PT "6 Clicks" Mobility    Outcome Measure  Help needed turning from your back to your side while in a flat bed without using bedrails?: A Little Help needed moving from lying on your back to sitting on the side of a flat bed without using bedrails?: A Lot Help needed moving to and from a bed to a chair (including a wheelchair)?: A Little Help needed standing up from a chair using your arms (e.g., wheelchair or bedside chair)?: A Little Help needed to walk in hospital room?: A Little Help needed climbing 3-5 steps with a railing? : A Lot 6 Click Score: 16    End of Session Equipment Utilized During Treatment: Back brace       PT Visit Diagnosis: Other abnormalities of gait and mobility (R26.89);Muscle weakness (generalized) (M62.81);Difficulty in walking, not elsewhere classified (R26.2)     Time: 1010-1033 PT Time Calculation (min) (ACUTE ONLY): 23 min  Charges:  $Gait Training: 8-22 mins                     Tamesha Ellerbrock L. Tamala Julian, [place] Pager [phone] 02/21/2019    Galen Manila 02/21/2019, 1:05 PM

## 2019-02-21 NOTE — Plan of Care (Signed)
  Problem: Pain Managment: Goal: General experience of comfort will improve Outcome: Progressing   

## 2019-02-22 NOTE — Progress Notes (Signed)
Late Entry: Wasted 50 mcg/1ML of fentanyl on 02/22/2019 at 0634 in the stericycle, witnessed by Cyril Loosen, RN.

## 2019-02-23 DIAGNOSIS — Z794 Long term (current) use of insulin: Secondary | ICD-10-CM | POA: Diagnosis not present

## 2019-02-23 DIAGNOSIS — N183 Chronic kidney disease, stage 3 unspecified: Secondary | ICD-10-CM | POA: Diagnosis not present

## 2019-02-23 DIAGNOSIS — M2578 Osteophyte, vertebrae: Secondary | ICD-10-CM | POA: Diagnosis not present

## 2019-02-23 DIAGNOSIS — Z792 Long term (current) use of antibiotics: Secondary | ICD-10-CM | POA: Diagnosis not present

## 2019-02-23 DIAGNOSIS — S22089D Unspecified fracture of T11-T12 vertebra, subsequent encounter for fracture with routine healing: Secondary | ICD-10-CM | POA: Diagnosis not present

## 2019-02-23 DIAGNOSIS — M5134 Other intervertebral disc degeneration, thoracic region: Secondary | ICD-10-CM | POA: Diagnosis not present

## 2019-02-23 DIAGNOSIS — I5032 Chronic diastolic (congestive) heart failure: Secondary | ICD-10-CM | POA: Diagnosis not present

## 2019-02-23 DIAGNOSIS — Z6839 Body mass index (BMI) 39.0-39.9, adult: Secondary | ICD-10-CM | POA: Diagnosis not present

## 2019-02-23 DIAGNOSIS — Z79891 Long term (current) use of opiate analgesic: Secondary | ICD-10-CM | POA: Diagnosis not present

## 2019-02-23 DIAGNOSIS — S22079D Unspecified fracture of T9-T10 vertebra, subsequent encounter for fracture with routine healing: Secondary | ICD-10-CM | POA: Diagnosis not present

## 2019-02-23 DIAGNOSIS — M40204 Unspecified kyphosis, thoracic region: Secondary | ICD-10-CM | POA: Diagnosis not present

## 2019-02-23 DIAGNOSIS — I130 Hypertensive heart and chronic kidney disease with heart failure and stage 1 through stage 4 chronic kidney disease, or unspecified chronic kidney disease: Secondary | ICD-10-CM | POA: Diagnosis not present

## 2019-02-23 DIAGNOSIS — I700 Atherosclerosis of aorta: Secondary | ICD-10-CM | POA: Diagnosis not present

## 2019-02-23 DIAGNOSIS — I2510 Atherosclerotic heart disease of native coronary artery without angina pectoris: Secondary | ICD-10-CM | POA: Diagnosis not present

## 2019-02-23 DIAGNOSIS — M8580 Other specified disorders of bone density and structure, unspecified site: Secondary | ICD-10-CM | POA: Diagnosis not present

## 2019-02-23 DIAGNOSIS — I50810 Right heart failure, unspecified: Secondary | ICD-10-CM | POA: Diagnosis not present

## 2019-02-23 DIAGNOSIS — E1122 Type 2 diabetes mellitus with diabetic chronic kidney disease: Secondary | ICD-10-CM | POA: Diagnosis not present

## 2019-02-23 DIAGNOSIS — I480 Paroxysmal atrial fibrillation: Secondary | ICD-10-CM | POA: Diagnosis not present

## 2019-02-23 DIAGNOSIS — Z981 Arthrodesis status: Secondary | ICD-10-CM | POA: Diagnosis not present

## 2019-02-23 DIAGNOSIS — M4804 Spinal stenosis, thoracic region: Secondary | ICD-10-CM | POA: Diagnosis not present

## 2019-02-23 DIAGNOSIS — I5020 Unspecified systolic (congestive) heart failure: Secondary | ICD-10-CM | POA: Diagnosis not present

## 2019-02-23 DIAGNOSIS — Z95810 Presence of automatic (implantable) cardiac defibrillator: Secondary | ICD-10-CM | POA: Diagnosis not present

## 2019-02-23 DIAGNOSIS — M810 Age-related osteoporosis without current pathological fracture: Secondary | ICD-10-CM | POA: Diagnosis not present

## 2019-02-23 DIAGNOSIS — Z7901 Long term (current) use of anticoagulants: Secondary | ICD-10-CM | POA: Diagnosis not present

## 2019-03-01 ENCOUNTER — Other Ambulatory Visit (HOSPITAL_COMMUNITY): Payer: Self-pay | Admitting: Internal Medicine

## 2019-03-01 DIAGNOSIS — I5022 Chronic systolic (congestive) heart failure: Secondary | ICD-10-CM

## 2019-03-02 ENCOUNTER — Other Ambulatory Visit: Payer: Self-pay

## 2019-03-06 NOTE — Telephone Encounter (Signed)
Spoke with daughter [name].  Discussed monthly ICM follow up and she agreed.  Patient is recuperating at home after having back surgery in October.  Patient's other daughter is staying with her to assist in care.  She has ICM phone number and encouraged to call if patient develops fluid symptoms.  Advised Dr Caryl Comes did approve that patient can be seen by Dr Rayann Heman in the [place] office since that is closer to her home.  Advised to call [place] office to set up appt.  Advised to call HF clinic to set up appt with Dr Haroldine Laws.  ICM remote transmission scheduled for 03/26/2019.  Daughter said to call her ICM transmission results.

## 2019-03-21 DIAGNOSIS — E1122 Type 2 diabetes mellitus with diabetic chronic kidney disease: Secondary | ICD-10-CM | POA: Diagnosis not present

## 2019-03-21 DIAGNOSIS — R1084 Generalized abdominal pain: Secondary | ICD-10-CM | POA: Diagnosis not present

## 2019-03-21 DIAGNOSIS — K219 Gastro-esophageal reflux disease without esophagitis: Secondary | ICD-10-CM | POA: Diagnosis not present

## 2019-03-21 DIAGNOSIS — J301 Allergic rhinitis due to pollen: Secondary | ICD-10-CM | POA: Diagnosis not present

## 2019-03-21 DIAGNOSIS — I10 Essential (primary) hypertension: Secondary | ICD-10-CM | POA: Diagnosis not present

## 2019-03-21 DIAGNOSIS — I482 Chronic atrial fibrillation, unspecified: Secondary | ICD-10-CM | POA: Diagnosis not present

## 2019-03-21 DIAGNOSIS — G6289 Other specified polyneuropathies: Secondary | ICD-10-CM | POA: Diagnosis not present

## 2019-03-21 DIAGNOSIS — Z79891 Long term (current) use of opiate analgesic: Secondary | ICD-10-CM | POA: Diagnosis not present

## 2019-03-21 DIAGNOSIS — I5032 Chronic diastolic (congestive) heart failure: Secondary | ICD-10-CM | POA: Diagnosis not present

## 2019-03-21 DIAGNOSIS — F331 Major depressive disorder, recurrent, moderate: Secondary | ICD-10-CM | POA: Diagnosis not present

## 2019-03-21 DIAGNOSIS — E782 Mixed hyperlipidemia: Secondary | ICD-10-CM | POA: Diagnosis not present

## 2019-03-22 ENCOUNTER — Other Ambulatory Visit (HOSPITAL_COMMUNITY): Payer: Self-pay | Admitting: Internal Medicine

## 2019-03-25 DIAGNOSIS — I2510 Atherosclerotic heart disease of native coronary artery without angina pectoris: Secondary | ICD-10-CM | POA: Diagnosis not present

## 2019-03-25 DIAGNOSIS — M8580 Other specified disorders of bone density and structure, unspecified site: Secondary | ICD-10-CM | POA: Diagnosis not present

## 2019-03-25 DIAGNOSIS — Z981 Arthrodesis status: Secondary | ICD-10-CM | POA: Diagnosis not present

## 2019-03-25 DIAGNOSIS — I480 Paroxysmal atrial fibrillation: Secondary | ICD-10-CM | POA: Diagnosis not present

## 2019-03-25 DIAGNOSIS — M40204 Unspecified kyphosis, thoracic region: Secondary | ICD-10-CM | POA: Diagnosis not present

## 2019-03-25 DIAGNOSIS — I5032 Chronic diastolic (congestive) heart failure: Secondary | ICD-10-CM | POA: Diagnosis not present

## 2019-03-25 DIAGNOSIS — I130 Hypertensive heart and chronic kidney disease with heart failure and stage 1 through stage 4 chronic kidney disease, or unspecified chronic kidney disease: Secondary | ICD-10-CM | POA: Diagnosis not present

## 2019-03-25 DIAGNOSIS — I5020 Unspecified systolic (congestive) heart failure: Secondary | ICD-10-CM | POA: Diagnosis not present

## 2019-03-25 DIAGNOSIS — M4804 Spinal stenosis, thoracic region: Secondary | ICD-10-CM | POA: Diagnosis not present

## 2019-03-25 DIAGNOSIS — Z7901 Long term (current) use of anticoagulants: Secondary | ICD-10-CM | POA: Diagnosis not present

## 2019-03-25 DIAGNOSIS — Z79891 Long term (current) use of opiate analgesic: Secondary | ICD-10-CM | POA: Diagnosis not present

## 2019-03-25 DIAGNOSIS — N183 Chronic kidney disease, stage 3 unspecified: Secondary | ICD-10-CM | POA: Diagnosis not present

## 2019-03-25 DIAGNOSIS — Z6839 Body mass index (BMI) 39.0-39.9, adult: Secondary | ICD-10-CM | POA: Diagnosis not present

## 2019-03-25 DIAGNOSIS — Z794 Long term (current) use of insulin: Secondary | ICD-10-CM | POA: Diagnosis not present

## 2019-03-25 DIAGNOSIS — Z95810 Presence of automatic (implantable) cardiac defibrillator: Secondary | ICD-10-CM | POA: Diagnosis not present

## 2019-03-25 DIAGNOSIS — M2578 Osteophyte, vertebrae: Secondary | ICD-10-CM | POA: Diagnosis not present

## 2019-03-25 DIAGNOSIS — I50810 Right heart failure, unspecified: Secondary | ICD-10-CM | POA: Diagnosis not present

## 2019-03-25 DIAGNOSIS — S22089D Unspecified fracture of T11-T12 vertebra, subsequent encounter for fracture with routine healing: Secondary | ICD-10-CM | POA: Diagnosis not present

## 2019-03-25 DIAGNOSIS — M810 Age-related osteoporosis without current pathological fracture: Secondary | ICD-10-CM | POA: Diagnosis not present

## 2019-03-25 DIAGNOSIS — E1122 Type 2 diabetes mellitus with diabetic chronic kidney disease: Secondary | ICD-10-CM | POA: Diagnosis not present

## 2019-03-25 DIAGNOSIS — S22079D Unspecified fracture of T9-T10 vertebra, subsequent encounter for fracture with routine healing: Secondary | ICD-10-CM | POA: Diagnosis not present

## 2019-03-25 DIAGNOSIS — Z792 Long term (current) use of antibiotics: Secondary | ICD-10-CM | POA: Diagnosis not present

## 2019-03-25 DIAGNOSIS — I700 Atherosclerosis of aorta: Secondary | ICD-10-CM | POA: Diagnosis not present

## 2019-03-25 DIAGNOSIS — M5134 Other intervertebral disc degeneration, thoracic region: Secondary | ICD-10-CM | POA: Diagnosis not present

## 2019-03-26 ENCOUNTER — Telehealth: Payer: Self-pay

## 2019-03-26 ENCOUNTER — Ambulatory Visit (INDEPENDENT_AMBULATORY_CARE_PROVIDER_SITE_OTHER): Payer: Medicare Other

## 2019-03-26 DIAGNOSIS — I5022 Chronic systolic (congestive) heart failure: Secondary | ICD-10-CM | POA: Diagnosis not present

## 2019-03-26 DIAGNOSIS — Z95810 Presence of automatic (implantable) cardiac defibrillator: Secondary | ICD-10-CM | POA: Diagnosis not present

## 2019-03-26 NOTE — Telephone Encounter (Signed)
Daughter called back to explain patient took 1 Metolazone 2.5 mg tablet with 1 extra Potassium tablet every other day starting 12/11 as instructed by PCP.  Daughter said PCP ordered to continue Metolazone every other day but daughter is very concerned she will end up in the hospital because patient will be dehydrated.  There are no labs or follow up appointment scheduled with the PCP.   Daughter reported it is hard to get the patient to be compliant with doctors appointments.    Advised I would send update to Dr Haroldine Laws to make him aware that patient has taken Metolazone on 12/11 & 12/13.

## 2019-03-26 NOTE — Telephone Encounter (Signed)
Let's switch lasix to torsemide 40 bid. Take one dose of metolazone 2.5 and k 40 tomorrow then stop metolazone Please check labs and repeat Optivol on Friday am. thanks

## 2019-03-26 NOTE — Telephone Encounter (Signed)
ICM call to daughter [name].  Heart Failure questions reviewed.  Pts feet/legs are chronically swollen but no worse than usual, no shortness of breath and does not weigh at home. Patient is inactive and not compliant with PT exercises after having recent back surgery.   Patient has taken Metolazone in the past with extra potassium.  Optivol thoracic impedance suggesting possible ongoing fluid accumulation since 02/23/2019.   Taking: Furosemide 40 mg take 2 tablets (80 mg total) twice a day.  Potassium 20 mEq take 1 tablet daily.   Metolazone 2.5 mg Take 2.5 mg by mouth daily as needed (for fluid retention resulting in 5lb weight gain).  Labs: 02/20/2019 Creatinine 1.08, BUN 19, Potassium 3.8, Sodium 133, GFR 50-58 02/17/2019 Creatinine 1.14, BUN 23, Potassium 4.5, Sodium 132, GFR 47-54  02/16/2019 Creatinine 1.44, BUN 24, Potassium 5.4, Sodium 134, GFR 35-41  02/12/2019 Creatinine 1.32, BUN 22, Potassium 4.3, Sodium 139, GFR 39-45  02/11/2019 Creatinine 1.41, BUN 27, Potassium 4.1, Sodium 136, GFR 36-41  02/07/2019 Creatinine 1.26, BUN 21, Potassium 4.0, Sodium 138, GFR 41-48  01/21/2019 Creatinine 1.23, BUN 30, Potassium 2.9, Sodium 138, GFR  43-49  A complete set of results can be found in Results Review.  Routed to Dr Haroldine Laws for review and recommendations if needed.    Follow-up plan: ICM clinic phone appointment on 04/02/2019 to recheck fluid levels.     Last visit with Dr Haroldine Laws was 05/23/2017 and 02/16/2019 virtual visit canceled due to hospitalization.  Last visit with Dr Caryl Comes was 10/16/2018.  3 month ICM trend: 03/26/2019    1 Year ICM trend:

## 2019-03-26 NOTE — Progress Notes (Signed)
EPIC Encounter for ICM Monitoring  Patient Name: Victoria Adams is a 78 y.o. female Date: 03/26/2019 Primary Care Physican: Caryl Bis, MD Primary Cardiologist: [place] Electrophysiologist: Caryl Comes (will be switching to Dr Rayann Heman in [place] office) Bi-V Pacing:   89.1%  Weight:  Unknown - patient does not weigh at home       1st ICM remote transmission.  Spoke with daughter Victoria Adams.  Heart Failure questions reviewed.  Pts feet/legs are chronically swollen but no worse than usual, no shortness of breath and does not weight at home.  She is inactive and not doing PT exercises following recent back surgery.   Patient has taken Metolazone in the past with extra potassium.  Optivol thoracic impedance suggesting possible ongoing fluid accumulation since 02/23/2019.   Taking: Furosemide 40 mg take 2 tablets (80 mg total) twice a day.  Potassium 20 mEq take 1 tablet daily.   Metolazone 2.5 mg Take 2.5 mg by mouth daily as needed (for fluid retention resulting in 5lb weight gain).  Labs: 02/20/2019 Creatinine 1.08, BUN 19, Potassium 3.8, Sodium 133, GFR 50-58 02/17/2019 Creatinine 1.14, BUN 23, Potassium 4.5, Sodium 132, GFR 47-54  02/16/2019 Creatinine 1.44, BUN 24, Potassium 5.4, Sodium 134, GFR 35-41  02/12/2019 Creatinine 1.32, BUN 22, Potassium 4.3, Sodium 139, GFR 39-45  02/11/2019 Creatinine 1.41, BUN 27, Potassium 4.1, Sodium 136, GFR 36-41  02/07/2019 Creatinine 1.26, BUN 21, Potassium 4.0, Sodium 138, GFR 41-48  01/21/2019 Creatinine 1.23, BUN 30, Potassium 2.9, Sodium 138, GFR  43-49  A complete set of results can be found in Results Review.  Recommendations:  Phone note to Dr Haroldine Laws for review and recommendations if needed.   Follow-up plan: ICM clinic phone appointment on 04/02/2019 (manual send) to recheck fluid levels.     Last visit with Dr Haroldine Laws was 05/23/2017 and 02/16/2019 virtual visit canceled due to hospitalization.  Last visit with Dr Caryl Comes was  10/16/2018.  Copy of ICM check sent to Dr. Caryl Comes.  3 month ICM trend: 03/26/2019    1 Year ICM trend:       [name], RN 03/26/2019 1:58 PM

## 2019-03-26 NOTE — Progress Notes (Signed)
Daughter called back to explain patient took 1 Metolazone 2.5 mg tablet with 1 extra Potassium tablet every other day starting 12/11 as instructed by PCP.  Daughter said PCP ordered to continue Metolazone every other day but daughter is very concerned she will end up in the hospital because patient will be dehydrated.  There are no labs or follow up appointment scheduled with the PCP.   Daughter reported it is hard to get the patient to be compliant with doctors appointments.    Advised I would send update to Dr Haroldine Laws to make him aware that patient has taken Metolazone on 12/11 & 12/13.

## 2019-03-27 DIAGNOSIS — I130 Hypertensive heart and chronic kidney disease with heart failure and stage 1 through stage 4 chronic kidney disease, or unspecified chronic kidney disease: Secondary | ICD-10-CM | POA: Diagnosis not present

## 2019-03-27 DIAGNOSIS — S22089D Unspecified fracture of T11-T12 vertebra, subsequent encounter for fracture with routine healing: Secondary | ICD-10-CM | POA: Diagnosis not present

## 2019-03-27 DIAGNOSIS — S22079D Unspecified fracture of T9-T10 vertebra, subsequent encounter for fracture with routine healing: Secondary | ICD-10-CM | POA: Diagnosis not present

## 2019-03-27 DIAGNOSIS — I5032 Chronic diastolic (congestive) heart failure: Secondary | ICD-10-CM | POA: Diagnosis not present

## 2019-03-27 DIAGNOSIS — I5020 Unspecified systolic (congestive) heart failure: Secondary | ICD-10-CM | POA: Diagnosis not present

## 2019-03-27 DIAGNOSIS — I2510 Atherosclerotic heart disease of native coronary artery without angina pectoris: Secondary | ICD-10-CM | POA: Diagnosis not present

## 2019-03-27 MED ORDER — TORSEMIDE 20 MG PO TABS: 40.0000 mg | ORAL_TABLET | Freq: Two times a day (BID) | ORAL | 2 refills | Status: DC

## 2019-03-27 MED ORDER — METOLAZONE 2.5 MG PO TABS: ORAL_TABLET | ORAL | 0 refills | Status: DC

## 2019-03-27 NOTE — Progress Notes (Signed)
Per Phone note:   [name], Shaune Pascal, MD  Physician  Specialty:  Cardiology  Telephone Encounter  Signed  Creation Time:  03/26/2019 10:56 PM          Signed        Let's switch lasix to torsemide 40 bid. Take one dose of metolazone 2.5 and k 40 tomorrow then stop metolazone Please check labs and repeat Optivol on Friday am. thanks

## 2019-03-27 NOTE — Telephone Encounter (Signed)
Spoke with daughter, Victoria Adams, per DPR.  Advised of Dr [name]'s recommendations:  1. Stop Lasix and switch to Torsemide 40 mg twice a day.  2. Take Metolazone 2.5 mg 1 tablet with 40 mEq Potassium today and then stop Metolazone.   3. BMET Lab to be drawn on Friday, 03/30/2019.  She needs to call [facility] to set appt time. 4. Optivol recheck on Friday 03/30/2019.    She verbalized understanding of recommendations.  Patient needs new prescription for Metolazone because the bottle she has is expired.   Confirmed preferred pharmacy and advised script quantity will be 5 tablets but patient to to only take 1 tablet at this time and not to take any more dosages unless directed by HF clinic.    She verbalized understanding.  Advised to call back for any questions or concerns and if I am unavailable to call Dr [name]'s office directly.  Encouraged to call Dr Haroldine Laws and Dr Jackalyn Lombard office in [place] to set up appointments.  (pt switching from Dr Caryl Comes to Dr Rayann Heman due to more convenient location)

## 2019-03-27 NOTE — Telephone Encounter (Signed)
Call to daughter, [name], [place].  Advised the Torsemide script will be 20 mg tablets and instructed patient to take 2 tablets (40 mg total) twice a day. Also reiterated to stop Lasix.  She reported the PCP is having labs drawn on Friday as well.  Explained BMET is ordered for patient to be drawn at [place] and suggested to call PCP to see if they are drawing the same labs.

## 2019-03-27 NOTE — Progress Notes (Signed)
Spoke with daughter, [name], per DPR.  Advised of Dr [name]'s recommendations:  1. Stop Lasix and switch to Torsemide 40 mg twice a day.  2. Take Metolazone 2.5 mg 1 tablet with 40 mEq Potassium today and then stop Metolazone.   3. BMET Lab to be drawn on Friday, 03/30/2019.  She needs to call [facility] to set appt time. 4. Optivol recheck on Friday 03/30/2019.    She verbalized understanding of recommendations.  Patient needs new prescription for Metolazone because the bottle she has is expired.   Confirmed preferred pharmacy and advised script quantity will be 5 tablets but patient to to only take 1 tablet at this time and not to take any more dosages unless directed by HF clinic.    She verbalized understanding.  Advised to call back for any questions or concerns and if I am unavailable to call Dr [name]'s office directly.  Encouraged to call Dr Haroldine Laws and Dr Jackalyn Lombard office in [place] to set up appointments.  (pt switching from Dr Caryl Comes to Dr Rayann Heman due to more convenient location)

## 2019-03-27 NOTE — Progress Notes (Signed)
Call to daughter, [name], [place].  Advised the Torsemide script will be 20 mg tablets and instructed patient to take 2 tablets (40 mg total) twice a day. Also reiterated to stop Lasix.  She reported the PCP is having labs drawn on Friday as well.  Explained BMET is ordered for patient to be drawn at [place] and suggested to call PCP to see if they are drawing the same labs.

## 2019-03-29 DIAGNOSIS — S22089D Unspecified fracture of T11-T12 vertebra, subsequent encounter for fracture with routine healing: Secondary | ICD-10-CM | POA: Diagnosis not present

## 2019-03-29 DIAGNOSIS — I2510 Atherosclerotic heart disease of native coronary artery without angina pectoris: Secondary | ICD-10-CM | POA: Diagnosis not present

## 2019-03-29 DIAGNOSIS — I130 Hypertensive heart and chronic kidney disease with heart failure and stage 1 through stage 4 chronic kidney disease, or unspecified chronic kidney disease: Secondary | ICD-10-CM | POA: Diagnosis not present

## 2019-03-29 DIAGNOSIS — I5020 Unspecified systolic (congestive) heart failure: Secondary | ICD-10-CM | POA: Diagnosis not present

## 2019-03-29 DIAGNOSIS — I5032 Chronic diastolic (congestive) heart failure: Secondary | ICD-10-CM | POA: Diagnosis not present

## 2019-03-29 DIAGNOSIS — S22079D Unspecified fracture of T9-T10 vertebra, subsequent encounter for fracture with routine healing: Secondary | ICD-10-CM | POA: Diagnosis not present

## 2019-03-30 ENCOUNTER — Telehealth: Payer: Self-pay

## 2019-03-30 ENCOUNTER — Ambulatory Visit (INDEPENDENT_AMBULATORY_CARE_PROVIDER_SITE_OTHER): Payer: Medicare Other

## 2019-03-30 DIAGNOSIS — I5022 Chronic systolic (congestive) heart failure: Secondary | ICD-10-CM

## 2019-03-30 DIAGNOSIS — Z95810 Presence of automatic (implantable) cardiac defibrillator: Secondary | ICD-10-CM

## 2019-03-30 MED ORDER — POTASSIUM CHLORIDE CRYS ER 20 MEQ PO TBCR: EXTENDED_RELEASE_TABLET | ORAL | 0 refills | Status: DC

## 2019-03-30 MED ORDER — METOLAZONE 2.5 MG PO TABS: ORAL_TABLET | ORAL | 0 refills | Status: DC

## 2019-03-30 NOTE — Telephone Encounter (Signed)
Spoke with daughter [name].  She reports patient took 12/15 Metolazone and it does increase patient's urination.  She is unsure if switching from Furosemide to Torsemide has increased urination but will discuss with patient.    Optivol thoracic impedance shows slight improvement after taking Metolazone 2.5 mg 12/11, 12/13 and on 03/27/2019 but impedance is still suggesting possible ongoing fluid accumulation since 02/23/2019.   Taking: Torsemide 20 mg take 2 tablets (40 mg total) twice a day (switched from Furosemide on 03/27/2019).  Potassium 20 mEq take 1 tablet daily.   Metolazone 2.5 mg Take 2.5 mg by mouth daily as needed (for fluid retention resulting in 5lb weight gain).  Labs:  03/30/2019 BMET to Be Drawn today 02/20/2019 Creatinine 1.08, BUN 19, Potassium 3.8, Sodium 133, GFR 50-58 02/17/2019 Creatinine 1.14, BUN 23, Potassium 4.5, Sodium 132, GFR 47-54  02/16/2019 Creatinine 1.44, BUN 24, Potassium 5.4, Sodium 134, GFR 35-41  02/12/2019 Creatinine 1.32, BUN 22, Potassium 4.3, Sodium 139, GFR 39-45  02/11/2019 Creatinine 1.41, BUN 27, Potassium 4.1, Sodium 136, GFR 36-41  02/07/2019 Creatinine 1.26, BUN 21, Potassium 4.0, Sodium 138, GFR 41-48  01/21/2019 Creatinine 1.23, BUN 30, Potassium 2.9, Sodium 138, GFR  43-49  A complete set of results can be found in Results Review.  Recommendations: Phone note to Dr Haroldine Laws for review and recommendations if needed.   3 month ICM trend: 03/30/2019    1 Year ICM trend:

## 2019-03-30 NOTE — Telephone Encounter (Signed)
Give 2 more doses of metolazone 2.5 + K 40   Check BMET next week and repeat optivol.   Have her take metolazone 2.5 + k 40 every Friday  thanks

## 2019-03-30 NOTE — Progress Notes (Signed)
Spoke with daughter, [name], per DPR. Advised of Dr [name]'s recommendations:  1. Take Metolazone 2.5 mg 1 tablet with 40 mEq Potassium daily x 2 days and then take every Friday there after.  2. BMET Lab to be drawn by Thursday, 04/05/2019. She needs to call [employer] to check about holiday hours and set appt time. 3. Optivol recheck on Friday 04/04/2019 (manual send).   She verbalized understanding of recommendations. Patient needs refill of Metolazone to take weekly dosage along with Potassium refill to include additional quantity for weekly Metolazone.    She verbalized understanding. Advised to call back for any questions or concerns and if I am unavailable to call Dr [name]'s office directly. (See ICM phone note for orders)

## 2019-03-30 NOTE — Progress Notes (Signed)
[name], Shaune Pascal, MD  Physician  Specialty:  Cardiology  Telephone Encounter  Signed  Creation Time:  03/30/2019 3:07 PM          Signed        Give 2 more doses of metolazone 2.5 + K 40   Check BMET next week and repeat optivol.   Have her take metolazone 2.5 + k 40 every Friday  thanks

## 2019-03-30 NOTE — Telephone Encounter (Signed)
Spoke with daughter, [name], per DPR. Advised of Dr [name]'s recommendations:  1. Take Metolazone 2.5 mg 1 tablet with 40 mEq Potassium daily x 2 days and then take every Friday there after.  2. BMET Lab to be drawn by Thursday, 04/05/2019. She needs to call [employer] to check about holiday hours and set appt time. 3. Optivol recheck on Friday 04/04/2019.   She verbalized understanding of recommendations. Patient needs refill of Metolazone to take weekly dosage along with Potassium refill to include additional quantity for weekly Metolazone.    She verbalized understanding. Advised to call back for any questions or concerns and if I am unavailable to call Dr [name]'s office directly

## 2019-03-30 NOTE — Progress Notes (Signed)
EPIC Encounter for ICM Monitoring  Patient Name: Victoria Adams is a 78 y.o. female Date: 03/30/2019 Primary Care Physican: Victoria Bis, MD Primary Cardiologist: Victoria Adams Electrophysiologist: Victoria Adams (will be switching to Victoria Adams in [place] office) Bi-V Pacing:   88.4%       Weight:  Unknown - patient does not weigh at home                                                           Spoke with daughter Victoria Adams.  She reports patient took 12/15 Metolazone and it does increase patient's urination.  She is unsure if switching from Furosemide to Torsemide has increased urination but will discuss with patient.    Optivol thoracic impedance shows slight improvement after taking Metolazone 2.5 mg 12/11, 12/13 and on 03/27/2019 but impedance is still suggesting possible ongoing fluid accumulation since 02/23/2019.   Taking: Torsemide 20 mg take 2 tablets (40 mg total) twice a day (switched from Furosemide on 03/27/2019).  Potassium 20 mEq take 1 tablet daily.   Metolazone 2.5 mg Take 2.5 mg by mouth daily as needed (for fluid retention resulting in 5lb weight gain).  Labs:  03/30/2019 Waiting on results 02/20/2019 Creatinine 1.08, BUN 19, Potassium 3.8, Sodium 133, GFR 50-58 02/17/2019 Creatinine 1.14, BUN 23, Potassium 4.5, Sodium 132, GFR 47-54  02/16/2019 Creatinine 1.44, BUN 24, Potassium 5.4, Sodium 134, GFR 35-41  02/12/2019 Creatinine 1.32, BUN 22, Potassium 4.3, Sodium 139, GFR 39-45  02/11/2019 Creatinine 1.41, BUN 27, Potassium 4.1, Sodium 136, GFR 36-41  02/07/2019 Creatinine 1.26, BUN 21, Potassium 4.0, Sodium 138, GFR 41-48  01/21/2019 Creatinine 1.23, BUN 30, Potassium 2.9, Sodium 138, GFR  43-49  A complete set of results can be found in Results Review.  Recommendations: Phone note to Victoria Victoria Adams for review and recommendations if needed.   Follow-up plan: ICM clinic phone appointment on 04/04/2019 (manual send) to recheck fluid levels.     Last visit with Victoria  Victoria Adams was 05/23/2017 and 02/16/2019 virtual visit canceled due to hospitalization.  Message sent to HF clinic scheduler to contact daughter to schedule office visit.   Last visit with Victoria Victoria Adams was 10/16/2018.  Copy of ICM check sent to Victoria. Caryl Adams.  3 month ICM trend: 03/30/2019    1 Year ICM trend:       Victoria Billings, RN 03/30/2019 12:25 PM

## 2019-04-02 ENCOUNTER — Other Ambulatory Visit (HOSPITAL_COMMUNITY): Payer: Self-pay | Admitting: Internal Medicine

## 2019-04-02 ENCOUNTER — Telehealth: Payer: Self-pay

## 2019-04-02 DIAGNOSIS — I5022 Chronic systolic (congestive) heart failure: Secondary | ICD-10-CM

## 2019-04-02 NOTE — Telephone Encounter (Signed)
Returned call to daughter [name] as requested by voice mail message.  She asked to have patient's labs to be rescheduled that were due on 03/30/2019.  Advised patient was supposed to have labs drawn on 12/18 and 12/24 since she has been taking Metolazone.  Daughter reports confusion on how much potassium patient should have daily and with Metolazone. She said her sister Joseph Art was not very clear on the meds.  Advised script was sent for Potassium 20 mEq take 1 tablet daily and 2 tablets with weekly Friday Metolazone.  Also new script to take Metolazone 2.5 mg every Friday with the additional potassium  Advised the importance that patient have labs drawn to check kidney function and electrolytes. Advised if potassium gets to low patient has risk of hospitalization and defbrillator shock.  She said patient was not taking any Potassium until 2 weeks ago and she started giving her 2 everyday as advised by PCP.  Advised patient should be following Dr [name]'s prescription orders and she needs to have an office visit scheduled.  Daughter Beckie Busing reports she has been staying with patient for the past 2 months due to assistance has been needed after back surgery.  She lives about 3 houses away from patient but has been staying there full time for 2 months.  She prefers direct communication regarding patient since she is patients current caretaker.  Potassium and Metolazone script instructions reviewed numerous times during the conversation and she repeated back correctly. Patient's feet are very swollen.  She did confirm patient took Metolazone with correct Potassium dosages on 12/19 & 12/20.  Advised to call if any further questions and to call Dr [name]'s office to make an appointment since patient has not been seen since November.

## 2019-04-02 NOTE — Telephone Encounter (Signed)
Received voice mail message from daughter [name] stating [facility] does not have a record of Metolazone and Potassium prescription send on 12/18.  Call back to daughter and advised I spoke with Cloyde Reams at [facility] and she said both scripts are on file at the pharmacy and she can pick them up today.  She said she will pick them up later today.

## 2019-04-04 ENCOUNTER — Telehealth: Payer: Self-pay

## 2019-04-04 NOTE — Telephone Encounter (Signed)
ICM call to daughter, [name].  Advised to send remote transmission this AM to review fluid levels.  She attempted to send transmission and unable to do so.  Advised to call [facility] support number for assistance.  Patient was not compliant with having BMET labs drawn on 12/18 or 12/22 as ordered by Dr Haroldine Laws.  Stressed the importance of checking labs for kidney function as well as electrolytes such as Potassium after taking several doses of Metolazone in the last week.  She verbalized understanding.  Patient had IV reclast on 12/21 and did not feel up to having labs drawn due to side effects.  Advised patient to obtain labs today if possible.  Explained office will be closed starting 04/04/2020 at noon for the holiday and if patient has any changes to call 911 or go to ER.   Remote Transmission rescheduled for 04/09/2019.

## 2019-04-11 NOTE — Progress Notes (Signed)
No ICM remote transmission received for 04/09/2019 and next ICM transmission scheduled for 05/07/2019.   

## 2019-04-16 ENCOUNTER — Ambulatory Visit (INDEPENDENT_AMBULATORY_CARE_PROVIDER_SITE_OTHER): Payer: Medicare Other | Admitting: *Deleted

## 2019-04-16 DIAGNOSIS — I428 Other cardiomyopathies: Secondary | ICD-10-CM | POA: Diagnosis not present

## 2019-04-16 LAB — CUP PACEART REMOTE DEVICE CHECK
Battery Remaining Longevity: 62 mo
Battery Voltage: 2.97 V
Brady Statistic AP VP Percent: 0 %
Brady Statistic AP VS Percent: 0 %
Brady Statistic AS VP Percent: 0 %
Brady Statistic AS VS Percent: 0 %
Brady Statistic RA Percent Paced: 0 %
Brady Statistic RV Percent Paced: 88.2 %
Date Time Interrogation Session: 20210104033423
HighPow Impedance: 62 Ohm
Implantable Lead Implant Date: 20180105
Implantable Lead Implant Date: 20180105
Implantable Lead Location: 753858
Implantable Lead Location: 753860
Implantable Lead Model: 4598
Implantable Pulse Generator Implant Date: 20180105
Lead Channel Impedance Value: 180 Ohm
Lead Channel Impedance Value: 190 Ohm
Lead Channel Impedance Value: 202.091
Lead Channel Impedance Value: 214.783
Lead Channel Impedance Value: 214.783
Lead Channel Impedance Value: 342 Ohm
Lead Channel Impedance Value: 380 Ohm
Lead Channel Impedance Value: 380 Ohm
Lead Channel Impedance Value: 4047 Ohm
Lead Channel Impedance Value: 456 Ohm
Lead Channel Impedance Value: 494 Ohm
Lead Channel Impedance Value: 494 Ohm
Lead Channel Impedance Value: 532 Ohm
Lead Channel Impedance Value: 627 Ohm
Lead Channel Impedance Value: 627 Ohm
Lead Channel Impedance Value: 760 Ohm
Lead Channel Impedance Value: 760 Ohm
Lead Channel Impedance Value: 779 Ohm
Lead Channel Pacing Threshold Amplitude: 0.5 V
Lead Channel Pacing Threshold Amplitude: 0.75 V
Lead Channel Pacing Threshold Pulse Width: 0.4 ms
Lead Channel Pacing Threshold Pulse Width: 0.8 ms
Lead Channel Sensing Intrinsic Amplitude: 14 mV
Lead Channel Sensing Intrinsic Amplitude: 14 mV
Lead Channel Setting Pacing Amplitude: 1.25 V
Lead Channel Setting Pacing Amplitude: 2 V
Lead Channel Setting Pacing Pulse Width: 0.4 ms
Lead Channel Setting Pacing Pulse Width: 0.8 ms
Lead Channel Setting Sensing Sensitivity: 0.3 mV

## 2019-04-17 DIAGNOSIS — I2510 Atherosclerotic heart disease of native coronary artery without angina pectoris: Secondary | ICD-10-CM | POA: Diagnosis not present

## 2019-04-17 DIAGNOSIS — I5032 Chronic diastolic (congestive) heart failure: Secondary | ICD-10-CM | POA: Diagnosis not present

## 2019-04-17 DIAGNOSIS — I130 Hypertensive heart and chronic kidney disease with heart failure and stage 1 through stage 4 chronic kidney disease, or unspecified chronic kidney disease: Secondary | ICD-10-CM | POA: Diagnosis not present

## 2019-04-17 DIAGNOSIS — S22079D Unspecified fracture of T9-T10 vertebra, subsequent encounter for fracture with routine healing: Secondary | ICD-10-CM | POA: Diagnosis not present

## 2019-04-17 DIAGNOSIS — I5020 Unspecified systolic (congestive) heart failure: Secondary | ICD-10-CM | POA: Diagnosis not present

## 2019-04-17 DIAGNOSIS — S22089D Unspecified fracture of T11-T12 vertebra, subsequent encounter for fracture with routine healing: Secondary | ICD-10-CM | POA: Diagnosis not present

## 2019-04-18 ENCOUNTER — Telehealth: Payer: Self-pay

## 2019-04-18 NOTE — Telephone Encounter (Signed)
Attempted ICM call to daughter [name] regarding 04/16/2019 optivol report which is suggesting fluid accumulation. Left message to return call.  Patient has office visit with HF clinic on 04/23/2019. Will recheck remote transmission on 04/23/2019 and send to HF clinic for review.

## 2019-04-23 ENCOUNTER — Encounter (HOSPITAL_COMMUNITY): Payer: Self-pay

## 2019-04-23 ENCOUNTER — Ambulatory Visit (HOSPITAL_COMMUNITY)
Admission: RE | Admit: 2019-04-23 | Discharge: 2019-04-23 | Disposition: A | Discharge status: Home / Self Care | Payer: Medicare Other | Source: Ambulatory Visit | Attending: Adult Health | Admitting: Adult Health

## 2019-04-23 ENCOUNTER — Ambulatory Visit (INDEPENDENT_AMBULATORY_CARE_PROVIDER_SITE_OTHER): Payer: Medicare Other

## 2019-04-23 ENCOUNTER — Other Ambulatory Visit: Payer: Self-pay

## 2019-04-23 VITALS — BP 113/79 | HR 64 | Wt 256.0 lb

## 2019-04-23 DIAGNOSIS — I5022 Chronic systolic (congestive) heart failure: Secondary | ICD-10-CM

## 2019-04-23 DIAGNOSIS — Z7901 Long term (current) use of anticoagulants: Secondary | ICD-10-CM | POA: Diagnosis not present

## 2019-04-23 DIAGNOSIS — Z8249 Family history of ischemic heart disease and other diseases of the circulatory system: Secondary | ICD-10-CM | POA: Diagnosis not present

## 2019-04-23 DIAGNOSIS — Z95810 Presence of automatic (implantable) cardiac defibrillator: Secondary | ICD-10-CM

## 2019-04-23 DIAGNOSIS — Z79899 Other long term (current) drug therapy: Secondary | ICD-10-CM | POA: Insufficient documentation

## 2019-04-23 DIAGNOSIS — I482 Chronic atrial fibrillation, unspecified: Secondary | ICD-10-CM | POA: Diagnosis not present

## 2019-04-23 DIAGNOSIS — Z833 Family history of diabetes mellitus: Secondary | ICD-10-CM | POA: Insufficient documentation

## 2019-04-23 DIAGNOSIS — I252 Old myocardial infarction: Secondary | ICD-10-CM | POA: Diagnosis not present

## 2019-04-23 DIAGNOSIS — Z794 Long term (current) use of insulin: Secondary | ICD-10-CM | POA: Insufficient documentation

## 2019-04-23 DIAGNOSIS — I2510 Atherosclerotic heart disease of native coronary artery without angina pectoris: Secondary | ICD-10-CM | POA: Diagnosis not present

## 2019-04-23 DIAGNOSIS — N1831 Chronic kidney disease, stage 3a: Secondary | ICD-10-CM | POA: Diagnosis not present

## 2019-04-23 DIAGNOSIS — N183 Chronic kidney disease, stage 3 unspecified: Secondary | ICD-10-CM | POA: Diagnosis not present

## 2019-04-23 DIAGNOSIS — I447 Left bundle-branch block, unspecified: Secondary | ICD-10-CM | POA: Diagnosis not present

## 2019-04-23 DIAGNOSIS — M549 Dorsalgia, unspecified: Secondary | ICD-10-CM | POA: Insufficient documentation

## 2019-04-23 DIAGNOSIS — I5023 Acute on chronic systolic (congestive) heart failure: Secondary | ICD-10-CM | POA: Insufficient documentation

## 2019-04-23 DIAGNOSIS — K219 Gastro-esophageal reflux disease without esophagitis: Secondary | ICD-10-CM | POA: Insufficient documentation

## 2019-04-23 DIAGNOSIS — E1122 Type 2 diabetes mellitus with diabetic chronic kidney disease: Secondary | ICD-10-CM | POA: Insufficient documentation

## 2019-04-23 DIAGNOSIS — I428 Other cardiomyopathies: Secondary | ICD-10-CM | POA: Diagnosis not present

## 2019-04-23 DIAGNOSIS — I130 Hypertensive heart and chronic kidney disease with heart failure and stage 1 through stage 4 chronic kidney disease, or unspecified chronic kidney disease: Secondary | ICD-10-CM | POA: Diagnosis not present

## 2019-04-23 DIAGNOSIS — R5381 Other malaise: Secondary | ICD-10-CM

## 2019-04-23 LAB — MAGNESIUM: Magnesium: 2 mg/dL (ref 1.7–2.4)

## 2019-04-23 LAB — BRAIN NATRIURETIC PEPTIDE: B Natriuretic Peptide: 213 pg/mL — ABNORMAL HIGH (ref 0.0–100.0)

## 2019-04-23 MED ORDER — TORSEMIDE 20 MG PO TABS: ORAL_TABLET | ORAL | 3 refills | Status: DC

## 2019-04-23 MED ORDER — METOLAZONE 2.5 MG PO TABS: ORAL_TABLET | ORAL | 0 refills | Status: DC

## 2019-04-23 NOTE — Patient Instructions (Addendum)
Lab work done today. We will notify you of any abnormal lab work. No news is good news!  INCREASE Torsemide to 80mg  every morning AND 40mg  every evening.  INCREASE Metolazone to 2.5mg  tab every Tuesday and every Friday.  Please follow up with the [facility] in 2 weeks.  At the [facility], you and your health needs are our priority. As part of our continuing mission to provide you with exceptional heart care, we have created designated Provider Care Teams. These Care Teams include your primary Cardiologist (physician) and Advanced Practice Providers (APPs- Physician Assistants and Nurse Practitioners) who all work together to provide you with the care you need, when you need it.   You may see any of the following providers on your designated Care Team at your next follow up: Marland Kitchen Dr Glori Bickers . Dr Loralie Champagne . Darrick Grinder, NP . Lyda Jester, PA . Audry Riles, PharmD   Please be sure to bring in all your medications bottles to every appointment.

## 2019-04-23 NOTE — Progress Notes (Signed)
Patient ID: Victoria Adams, female   DOB: 05-Nov-1940, 79 y.o.   MRN: [id number]     Advanced Heart Failure Clinic Note   PCP: Dr. Gar Ponto ([facility] in [place]) Primary Cardiologist: Dr. Domenic Polite EP: Dr Caryl Comes  HF: Dr. Haroldine Laws   HPI: Victoria Adams is a 79 y.o. female with a history of morbid obesity, HTN, chronic A-Fib, poorly-controlled DM2, CAD, chronic back pain, chronic systolic HF due to NICM s/p CTR-D 2018.   Admitted 02/07/19 after fall resulting in unstable compression fracture. Underwent T10-T12 posterior lateral fusion. Hospitalization prolonged. Discharged on 02/21/19.  Since discharge she has struggled with increased leg edema and volume overload. Followed closely in the device clinic with frequent adjustments to her diuretics.  She was switched to torsemide but still with volume overload.   Today she returns for HF follow up.Overall feeling fair. Not moving around much at home.  SOB with exertion. Denies PND/Orthopnea. Complaining leg edema.  Appetite ok. No fever or chills. Weighing at home on occasion. Taking all medications but she did not think she needed to take potassium.    Optivol: Well above Threshold. Impedance down. Activity < 1 hour per day. No VT/A fib.   ECHO (08/2013) EF 20-25%, RV normal            (02/2014) EF 25-30%, RV normal           (09/2014) EF 20-25% RV mildly dilated.            (02/2016) EF 20-25% RV normal            (06/2016)  Echo 06/15/16 EF 60-65%  SH: Lives in [place] with son and daughter, no ETOH and does not smoke, retired  Damar: Mother deceased: CAD?, HTN, lung cancer, afib        Father deceased: DM2, lung cancer, HTN, CAD stents        3 daughters (1 has afib)        1 son: healthy and alive  Review of systems complete and found to be negative unless listed in HPI.    Past Medical History:  Diagnosis Date  . Allergic rhinitis   . 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 Atrial fibrillation ([place])   . Chronic low back pain    Secondary to  DJD  . Chronic systolic heart failure (HCC)    a. echo (5/15):  mod LVH, EF 20-25%, diff HK, severe LAE, mild RAE  . Coronary atherosclerosis-non obstructive    LHC (5/15):  EF 40-45% global HK; long LAD 40-60%, ostial 1st major septal perf 80-90%, ostial CFX ?%, mid AVCFX extensive Ca2+, dist PDA 50%  . Depression   . Essential hypertension, benign   . Fatty liver disease, nonalcoholic   . GERD (gastroesophageal reflux disease)   . Iron deficiency anemia   . Mitral regurgitation   . NICM (nonischemic cardiomyopathy) ([place])    a. echo (5/15):  mod LVH, EF 20-25%, diff HK, severe LAE, mild RAE  . NSTEMI (non-ST elevated myocardial infarction) ([place]) may 2015   No CAD  . Obesity   . Osteopenia   . PONV (postoperative nausea and vomiting)   . Rosacea   . Type 2 diabetes mellitus ([place])   . Vitamin D deficiency     Current Outpatient Medications  Medication Sig Dispense Refill  . amLODipine (NORVASC) 10 MG tablet Take 1 tablet (10 mg total) by mouth daily. 30 tablet 0  . Calcium Carb-Cholecalciferol (CALCIUM  600 + D PO) Take 1 tablet by mouth 2 (two) times daily.     . carvedilol (COREG) 25 MG tablet TAKE (1) TABLET TWICE DAILY. 60 tablet 0  . colchicine 0.6 MG tablet Take 1 tablet (0.6 mg total) by mouth daily. 30 tablet 0  . dimenhyDRINATE (DRAMAMINE) 50 MG tablet Take 50 mg by mouth every 8 (eight) hours as needed for nausea.    . diphenhydrAMINE (BENADRYL) 25 MG tablet Take 25 mg by mouth daily as needed for itching.    Marland Kitchen doxycycline (VIBRAMYCIN) 100 MG capsule Take 100-200 mg by mouth 2 (two) times daily as needed (rosacea).     Marland Kitchen ELIQUIS 5 MG TABS tablet TAKE (1) TABLET TWICE DAILY. (Patient taking differently: Take 5 mg by mouth 2 (two) times daily. ) 60 tablet 3  . ferrous sulfate 325 (65 FE) MG tablet TAKE 1 TABLET ONCE DAILY WITH BREAKFAST. 30 tablet 0  . gabapentin (NEURONTIN) 300 MG capsule Take 1 capsule (300 mg total) by mouth 3 (three) times daily. 90 capsule 0  .  HYDROcodone-acetaminophen (NORCO) 10-325 MG tablet Take 1 tablet by mouth every 6 (six) hours as needed for moderate pain (To be taken 3 hours after Dilaudid, if Dilaudid is ineffective).     Marland Kitchen HYDROmorphone (DILAUDID) 2 MG tablet Take 1 tablet (2 mg total) by mouth every 4 (four) hours as needed for severe pain. 20 tablet 0  . insulin glargine (LANTUS) 100 UNIT/ML injection Inject 0.6-1 mLs (60-100 Units total) into the skin 2 (two) times daily. 100 units in the morning and 60 units in the evening 10 mL 11  . LORazepam (ATIVAN) 0.5 MG tablet Take 1 tablet (0.5 mg total) by mouth every 8 (eight) hours as needed for anxiety. 15 tablet 0  . metolazone (ZAROXOLYN) 2.5 MG tablet Take 1 tablet (2.5mg  total) by mouth weekly every Friday with 40 mEq of Potassium. 12 tablet 0  . omeprazole (PRILOSEC) 20 MG capsule Take 20 mg by mouth 2 (two) times daily before a meal.    . ondansetron (ZOFRAN ODT) 4 MG disintegrating tablet Take 1 tablet (4 mg total) by mouth every 8 (eight) hours as needed for nausea or vomiting. 20 tablet 0  . polyethylene glycol (MIRALAX / GLYCOLAX) packet Take 17 g by mouth daily.    . potassium chloride SA (KLOR-CON) 20 MEQ tablet Take 1 tablet (20 mEq total) by mouth daily.  Take 2 additional tablets (40 mEq total) with each weekly Metolazone dose on Fridays. 114 tablet 0  . rosuvastatin (CRESTOR) 20 MG tablet Take 1 tablet (20 mg total) by mouth daily. 30 tablet 11  . senna-docusate (SENOKOT-S) 8.6-50 MG tablet Take 2 tablets by mouth 2 (two) times daily. 60 tablet 0  . sertraline (ZOLOFT) 100 MG tablet Take 100 mg by mouth daily.    Marland Kitchen torsemide (DEMADEX) 20 MG tablet Take 2 tablets (40 mg total) by mouth 2 (two) times daily. 360 tablet 2   No current facility-administered medications for this encounter.   Vitals:   04/23/19 1508  BP: 113/79  Pulse: 64  SpO2: 94%  Weight: 116.1 kg (256 lb)   Wt Readings from Last 3 Encounters:  04/23/19 116.1 kg (256 lb)  02/17/19 116.6 kg  (257 lb)  05/23/17 114.2 kg (251 lb 12.8 oz)   PHYSICAL EXAM: General: No resp difficulty. Ambulated in the clinic with a rolling walker.  HEENT: normal Neck: supple. JVD 10-11. Carotids 2+ bilat; no bruits. No lymphadenopathy or thryomegaly  appreciated. Cor: PMI nondisplaced. Regular rate & rhythm. No rubs, gallops or murmurs. Lungs: clear Abdomen: soft, nontender, nondistended. No hepatosplenomegaly. No bruits or masses. Good bowel sounds. Extremities: no cyanosis, clubbing, rash, R and LLE 2+ edema Neuro: alert & orientedx3, cranial nerves grossly intact. moves all 4 extremities w/o difficulty. Affect pleasant  ASSESSMENT & PLAN: 1) Acute/Chronic systolic HF: NICM, EF [id number], RV nl (02/2014).  ECHO 09/2014 EF 20-25%. ECHO 01/2016 EF 20-25%.  - s/p CRT-D 1/18 -> Echo 3/18 EF 60-65% EF recovered with CRT-D - ECHO 01/2019 EF 60-65%.  - NYHA III. Volume status elevated. Increase torsemide to 80 mg in am and 40 mg pm. Increase metolazone to twice a week.  - Continue carvedilol 25 mg twice a day.  - We discussed that she needs to take potassium as ordered.  2) CAD: Moderate diffuse mid LAD disease of 40-60%. Septal perforator branch with ostial ~90%.  No chest pain.  - Continue statin and bb.   3) CKD stage III: - BMET today  4) Chronic Afib:  - Rate controlled.  - Continue eliquis 5 mg BID.   5) LBBB-  - s/p CRT-D 1/18 6) HTN Stable.  7) Back pain Followed PT.  Check BMET today. Suspect she potassium will be low because she hasnt been taking potassium.  Discussed that she needs to follow up in 1-2 weeks. If no improvement she will be given need IV lasix in the clinic.   Darrick Grinder, NP  04/23/2019

## 2019-04-23 NOTE — Progress Notes (Signed)
EPIC Encounter for ICM Monitoring  Patient Name: Victoria Adams is a 79 y.o. female Date: 04/23/2019 Primary Care Physican: Caryl Bis, MD Primary Cardiologist:[name] Electrophysiologist:Klein (will be switching to Dr Rayann Heman in [place] office) Bi-V Pacing:89.6% Weight: Unknown - patient does not weigh at home   Transmission reviewed and sent to HF clinic for review.   Optivol thoracic impedance continues to suggest ongoing fluid accumulation after taking Metolazone 2.5 mg 12/11, 12/13 and on 03/27/2019.   Taking: Torsemide 20 mg take 2 tablets (40 mg total) twice a day (switched from Furosemide on 03/27/2019).  Potassium 20 mEq take 1 tablet daily.  Metolazone 2.5 mgTake 2.5 mg by mouth daily as needed (for fluid retention resulting in 5lb weight gain).  Labs:  02/20/2019 Creatinine1.08, BUN19, Potassium3.8, Sodium133, GFR50-58 02/17/2019 Creatinine1.14, BUN23, Potassium4.5, Sodium132, [id number]  02/16/2019 Creatinine1.44, BUN24, Potassium5.4, Sodium134, [id number]  02/12/2019 Creatinine1.32, BUN22, Potassium4.3, [id number], [id number]  02/11/2019 Creatinine1.41, BUN27, Potassium4.1, [id number], [id number]  02/07/2019 Creatinine1.26, BUN21, Potassium4.0, [id number], [id number]  01/21/2019 Creatinine1.23, BUN30, Potassium2.9, [id number], [id number] A complete set of results can be found in Results Review.  Recommendations: Sent to Darrick Grinder, NP HF clinic.  Patient has HF clinic today.    Follow-up plan: ICM clinic phone appointment on 05/07/2019 to recheck fluid levels.   Copy of ICM check sent to [place].  3 month ICM trend: 04/23/2019   1 Year ICM trend:       [name], RN 04/23/2019 3:28 PM

## 2019-04-24 ENCOUNTER — Telehealth (HOSPITAL_COMMUNITY): Payer: Self-pay | Admitting: Cardiology

## 2019-04-24 ENCOUNTER — Telehealth (HOSPITAL_COMMUNITY): Payer: Self-pay

## 2019-04-24 DIAGNOSIS — I5022 Chronic systolic (congestive) heart failure: Secondary | ICD-10-CM

## 2019-04-24 LAB — BASIC METABOLIC PANEL
Anion gap: 11 (ref 5–15)
BUN: 19 mg/dL (ref 8–23)
CO2: 36 mmol/L — ABNORMAL HIGH (ref 22–32)
Calcium: 8.3 mg/dL — ABNORMAL LOW (ref 8.9–10.3)
Chloride: 93 mmol/L — ABNORMAL LOW (ref 98–111)
Creatinine, Ser: 2.05 mg/dL — ABNORMAL HIGH (ref 0.44–1.00)
GFR calc Af Amer: 26 mL/min — ABNORMAL LOW (ref >60)
GFR calc non Af Amer: 23 mL/min — ABNORMAL LOW (ref >60)
Glucose, Bld: 78 mg/dL (ref 70–99)
Potassium: 2.7 mmol/L — CL (ref 3.5–5.1)
Sodium: 140 mmol/L (ref 135–145)

## 2019-04-24 MED ORDER — POTASSIUM CHLORIDE CRYS ER 20 MEQ PO TBCR: EXTENDED_RELEASE_TABLET | ORAL | 2 refills | Status: DC

## 2019-04-24 NOTE — Telephone Encounter (Signed)
-----   Message from Conrad Baileyton, NP sent at 04/23/2019  5:35 PM EST ----- Potassium low. She has not been taking potassium.  I attempted to call with no answer.   Please call and instruct to take 40 meq of potassium daily with an extra 40 meq potassium on metolazone daily. Needs repeat BMET in 7 days.

## 2019-04-24 NOTE — Telephone Encounter (Signed)
Called pt two numbers as well as the number for [employer] (person who fixes pt's pill box). Left voicemail for call back.

## 2019-04-24 NOTE — Progress Notes (Signed)
Recommendations were given at HF clinic OV on 04/23/2019.  Per OV notes patient had not been taking Potassium because she didn't think she needed it.  Scheduled follow up ICM remote transmission to 04/30/2019.  Metolazone, Potasssium and Torsemide dosages changed per office notes.

## 2019-04-24 NOTE — Telephone Encounter (Signed)
-----   Message from Conrad Empire, NP sent at 04/23/2019  5:35 PM EST ----- Potassium low. She has not been taking potassium.  I attempted to call with no answer.   Please call and instruct to take 40 meq of potassium daily with an extra 40 meq potassium on metolazone daily. Needs repeat BMET in 7 days.

## 2019-04-30 ENCOUNTER — Other Ambulatory Visit (HOSPITAL_COMMUNITY): Payer: Self-pay | Admitting: Internal Medicine

## 2019-04-30 ENCOUNTER — Ambulatory Visit (INDEPENDENT_AMBULATORY_CARE_PROVIDER_SITE_OTHER): Payer: Medicare Other

## 2019-04-30 DIAGNOSIS — I5022 Chronic systolic (congestive) heart failure: Secondary | ICD-10-CM

## 2019-04-30 DIAGNOSIS — Z95810 Presence of automatic (implantable) cardiac defibrillator: Secondary | ICD-10-CM

## 2019-05-01 NOTE — Progress Notes (Signed)
EPIC Encounter for ICM Monitoring  Patient Name: Victoria Adams is a 79 y.o. female Date: 05/01/2019 Primary Care Physican: Caryl Bis, MD Primary Cardiologist:[name] Electrophysiologist:Klein (will be switching to Dr Rayann Heman in [place] office) Bi-V Pacing:88.9% Weight: Unknown - patient does not weigh at home  Transmission reviewed.   Optivol thoracic impedance has returned to baseline normal.   Prescribed:  Torsemide 20 mg Take 4 tablets (80 mg total) by mouth every morning AND 2 tablets (40 mg total) every evening.  Potassium 20 mEq Take 2 tabs (40 meq) daily. Take 2 additional tablets (40 mEq total) with each weekly Metolazone dose on Tues/Fri  Metolazone 2.5 mgTake 1 tablet (2.5mg  total) by mouth weekly every Tuesday AND Friday with 40 mEq of Potassium.  Labs: 04/23/2019 Creatinine 2.05, BUN 19, Potassium 2.7, Sodium 140, GFR 23-26 02/20/2019 Creatinine1.08, BUN19, Potassium3.8, Sodium133, GFR50-58 02/17/2019 Creatinine1.14, BUN23, Potassium4.5, Sodium132, [id number]  02/16/2019 Creatinine1.44, BUN24, Potassium5.4, Sodium134, GFR35-41  02/12/2019 Creatinine1.32, BUN22, Potassium4.3, Sodium139, [id number]  02/11/2019 Creatinine1.41, BUN27, Potassium4.1, Sodium136, [id number]  02/07/2019 Creatinine1.26, BUN21, Potassium4.0, Sodium138, [id number]  01/21/2019 Creatinine1.23, BUN30, Potassium2.9, [id number], [id number] A complete set of results can be found in Results Review.  Recommendations:  None  Follow-up plan: ICM clinic phone appointment on 05/07/2019 to recheck fluid levels.   91 day device clinic remote transmission 07/16/2019.  Office appt 05/07/2019 with HF clinic PA/NP.    Copy of ICM check sent to Dr. Caryl Comes.   3 month ICM trend: 04/30/2019    1 Year ICM trend:       [name], RN 05/01/2019 3:43 PM

## 2019-05-02 ENCOUNTER — Inpatient Hospital Stay (HOSPITAL_COMMUNITY)
Admission: RE | Admit: 2019-05-02 | Discharge: 2019-05-02 | Disposition: A | Discharge status: Home / Self Care | Payer: Medicare Other | Source: Ambulatory Visit

## 2019-05-02 DIAGNOSIS — S22080A Wedge compression fracture of T11-T12 vertebra, initial encounter for closed fracture: Secondary | ICD-10-CM | POA: Diagnosis not present

## 2019-05-07 ENCOUNTER — Ambulatory Visit (INDEPENDENT_AMBULATORY_CARE_PROVIDER_SITE_OTHER): Payer: Medicare Other

## 2019-05-07 ENCOUNTER — Ambulatory Visit (HOSPITAL_COMMUNITY)
Admission: RE | Admit: 2019-05-07 | Discharge: 2019-05-07 | Disposition: A | Discharge status: Home / Self Care | Payer: Medicare Other | Source: Ambulatory Visit | Attending: Adult Health | Admitting: Adult Health

## 2019-05-07 ENCOUNTER — Encounter (HOSPITAL_COMMUNITY): Payer: Self-pay

## 2019-05-07 ENCOUNTER — Other Ambulatory Visit: Payer: Self-pay

## 2019-05-07 ENCOUNTER — Telehealth (HOSPITAL_COMMUNITY): Payer: Self-pay | Admitting: Cardiology

## 2019-05-07 VITALS — BP 148/82 | HR 75 | Wt 247.6 lb

## 2019-05-07 DIAGNOSIS — F329 Major depressive disorder, single episode, unspecified: Secondary | ICD-10-CM | POA: Insufficient documentation

## 2019-05-07 DIAGNOSIS — I5022 Chronic systolic (congestive) heart failure: Secondary | ICD-10-CM | POA: Diagnosis not present

## 2019-05-07 DIAGNOSIS — Z7901 Long term (current) use of anticoagulants: Secondary | ICD-10-CM | POA: Insufficient documentation

## 2019-05-07 DIAGNOSIS — Z801 Family history of malignant neoplasm of trachea, bronchus and lung: Secondary | ICD-10-CM | POA: Insufficient documentation

## 2019-05-07 DIAGNOSIS — Z6838 Body mass index (BMI) 38.0-38.9, adult: Secondary | ICD-10-CM | POA: Insufficient documentation

## 2019-05-07 DIAGNOSIS — Z79899 Other long term (current) drug therapy: Secondary | ICD-10-CM | POA: Insufficient documentation

## 2019-05-07 DIAGNOSIS — K219 Gastro-esophageal reflux disease without esophagitis: Secondary | ICD-10-CM | POA: Diagnosis not present

## 2019-05-07 DIAGNOSIS — I5023 Acute on chronic systolic (congestive) heart failure: Secondary | ICD-10-CM | POA: Insufficient documentation

## 2019-05-07 DIAGNOSIS — Z981 Arthrodesis status: Secondary | ICD-10-CM | POA: Diagnosis not present

## 2019-05-07 DIAGNOSIS — I482 Chronic atrial fibrillation, unspecified: Secondary | ICD-10-CM | POA: Diagnosis not present

## 2019-05-07 DIAGNOSIS — Z794 Long term (current) use of insulin: Secondary | ICD-10-CM | POA: Insufficient documentation

## 2019-05-07 DIAGNOSIS — E1122 Type 2 diabetes mellitus with diabetic chronic kidney disease: Secondary | ICD-10-CM | POA: Diagnosis not present

## 2019-05-07 DIAGNOSIS — I428 Other cardiomyopathies: Secondary | ICD-10-CM | POA: Diagnosis not present

## 2019-05-07 DIAGNOSIS — M8580 Other specified disorders of bone density and structure, unspecified site: Secondary | ICD-10-CM | POA: Insufficient documentation

## 2019-05-07 DIAGNOSIS — Z833 Family history of diabetes mellitus: Secondary | ICD-10-CM | POA: Insufficient documentation

## 2019-05-07 DIAGNOSIS — I447 Left bundle-branch block, unspecified: Secondary | ICD-10-CM | POA: Diagnosis not present

## 2019-05-07 DIAGNOSIS — Z95810 Presence of automatic (implantable) cardiac defibrillator: Secondary | ICD-10-CM | POA: Diagnosis not present

## 2019-05-07 DIAGNOSIS — I130 Hypertensive heart and chronic kidney disease with heart failure and stage 1 through stage 4 chronic kidney disease, or unspecified chronic kidney disease: Secondary | ICD-10-CM | POA: Diagnosis not present

## 2019-05-07 DIAGNOSIS — N1831 Chronic kidney disease, stage 3a: Secondary | ICD-10-CM

## 2019-05-07 DIAGNOSIS — I2510 Atherosclerotic heart disease of native coronary artery without angina pectoris: Secondary | ICD-10-CM | POA: Diagnosis not present

## 2019-05-07 DIAGNOSIS — I252 Old myocardial infarction: Secondary | ICD-10-CM | POA: Diagnosis not present

## 2019-05-07 DIAGNOSIS — K760 Fatty (change of) liver, not elsewhere classified: Secondary | ICD-10-CM | POA: Insufficient documentation

## 2019-05-07 DIAGNOSIS — N183 Chronic kidney disease, stage 3 unspecified: Secondary | ICD-10-CM | POA: Insufficient documentation

## 2019-05-07 DIAGNOSIS — M549 Dorsalgia, unspecified: Secondary | ICD-10-CM | POA: Insufficient documentation

## 2019-05-07 DIAGNOSIS — Z8249 Family history of ischemic heart disease and other diseases of the circulatory system: Secondary | ICD-10-CM | POA: Diagnosis not present

## 2019-05-07 LAB — BASIC METABOLIC PANEL
Anion gap: 13 (ref 5–15)
BUN: 30 mg/dL — ABNORMAL HIGH (ref 8–23)
CO2: 34 mmol/L — ABNORMAL HIGH (ref 22–32)
Calcium: 9.3 mg/dL (ref 8.9–10.3)
Chloride: 87 mmol/L — ABNORMAL LOW (ref 98–111)
Creatinine, Ser: 2.09 mg/dL — ABNORMAL HIGH (ref 0.44–1.00)
GFR calc Af Amer: 26 mL/min — ABNORMAL LOW (ref >60)
GFR calc non Af Amer: 22 mL/min — ABNORMAL LOW (ref >60)
Glucose, Bld: 185 mg/dL — ABNORMAL HIGH (ref 70–99)
Potassium: 2.7 mmol/L — CL (ref 3.5–5.1)
Sodium: 134 mmol/L — ABNORMAL LOW (ref 135–145)

## 2019-05-07 MED ORDER — METOLAZONE 2.5 MG PO TABS: ORAL_TABLET | ORAL | 0 refills | Status: DC

## 2019-05-07 MED ORDER — POTASSIUM CHLORIDE CRYS ER 20 MEQ PO TBCR: 40.0000 meq | EXTENDED_RELEASE_TABLET | Freq: Two times a day (BID) | ORAL | 2 refills | Status: DC

## 2019-05-07 NOTE — Progress Notes (Signed)
EPIC Encounter for ICM Monitoring  Patient Name: Victoria Adams is a 79 y.o. female Date: 05/07/2019 Primary Care Physican: Caryl Bis, MD Primary Cardiologist:[name] Electrophysiologist:Klein (will be switching to Dr Rayann Heman in [place] office) Bi-V Pacing:89.9% Weight: Unknown - patient does not weigh at home  Transmission reviewed.  Optivol thoracic impedanceis at baseline normal.   Prescribed:  Torsemide 20 mg Take 4 tablets (80 mg total) by mouth every morning AND 2 tablets (40 mg total) every evening.  Potassium 20 mEq Take 2 tabs (40 meq) daily. Take 2 additional tablets (40 mEq total) with each weekly Metolazone dose on Tues/Fri  Metolazone 2.5 mgTake 1 tablet (2.5mg  total) by mouth weekly every Tuesday AND Friday with 40 mEq of Potassium.  Labs: 04/23/2019 Creatinine 2.05, BUN 19, Potassium 2.7, Sodium 140, GFR 23-26 02/20/2019 Creatinine1.08, BUN19, Potassium3.8, Sodium133, GFR50-58 02/17/2019 Creatinine1.14, BUN23, Potassium4.5, Sodium132, [id number]  02/16/2019 Creatinine1.44, BUN24, Potassium5.4, Sodium134, GFR35-41  02/12/2019 Creatinine1.32, BUN22, Potassium4.3, Sodium139, [id number]  02/11/2019 Creatinine1.41, BUN27, Potassium4.1, Sodium136, [id number]  02/07/2019 Creatinine1.26, BUN21, Potassium4.0, Sodium138, [id number]  01/21/2019 Creatinine1.23, BUN30, Potassium2.9, [id number], [id number] A complete set of results can be found in Results Review.  Recommendations:  None  Follow-up plan: ICM clinic phone appointment on 06/04/2019.   91 day device clinic remote transmission 07/16/2019.  Office appt 05/07/2019 with HF clinic PA/NP.    Copy of ICM check sent to Dr. Caryl Comes and Darrick Grinder, NP At HF clinic for review at office visit today, 05/07/2019.   3 month ICM trend: 05/07/2019    1 Year ICM trend:       [name], RN 05/07/2019 1:14 PM

## 2019-05-07 NOTE — Telephone Encounter (Signed)
Critical labs received from [facility] labs K 2.7  Per Amy Clegg,NP Increase potassium to 40 meq BID with additional 40 of metolazone  Patient aware via daughter and voiced understanding

## 2019-05-07 NOTE — Patient Instructions (Addendum)
Lab work done today. We will notify you of any abnormal lab work. No news is good news!  DECREASE Metolazone 2.5mg  tab to one tab every Friday.  Please follow up with the [facility] in 3 months.  At the [facility], you and your health needs are our priority. As part of our continuing mission to provide you with exceptional heart care, we have created designated Provider Care Teams. These Care Teams include your primary Cardiologist (physician) and Advanced Practice Providers (APPs- Physician Assistants and Nurse Practitioners) who all work together to provide you with the care you need, when you need it.   You may see any of the following providers on your designated Care Team at your next follow up: Marland Kitchen Dr Glori Bickers . Dr Loralie Champagne . Darrick Grinder, NP . Lyda Jester, PA . Audry Riles, PharmD   Please be sure to bring in all your medications bottles to every appointment.

## 2019-05-07 NOTE — Progress Notes (Addendum)
Patient ID: Victoria Adams, female   DOB: 1941-03-03, 79 y.o.   MRN: [id number]     Advanced Heart Failure Clinic Note   PCP: Dr. Gar Ponto ([facility] in [place]) Primary Cardiologist: Dr. Domenic Polite EP: Dr Caryl Comes  HF: Dr. Haroldine Laws   HPI: Victoria Adams is a 79 y.o. female with a history of morbid obesity, HTN, chronic A-Fib, poorly-controlled DM2, CAD, chronic back pain, chronic systolic HF due to NICM s/p CTR-D 2018.   Admitted 02/07/19 after fall resulting in unstable compression fracture. Underwent T10-T12 posterior lateral fusion. Hospitalization prolonged. Discharged on 02/21/19.  Since discharge she has struggled with increased leg edema and volume overload. Followed closely in the device clinic with frequent adjustments to her diuretics.  She was switched to torsemide but still with volume overload.   Today she returns for HF follow up. Last visit she was volume overloaded so torsemide was increased to 80 mg/40 mg and metolazone was increased to twice a week. Overall feeling a little better. Having ongoing back pain. Rarely short of breath. Denies PND/Orthopnea. Appetite ok. No fever or chills. Weight at home has gone down from 264-->242 pounds. Taking all medications.   Optivol:Impedance at baseline. Fluid index down.   ECHO (08/2013) EF 20-25%, RV normal            (02/2014) EF 25-30%, RV normal           (09/2014) EF 20-25% RV mildly dilated.            (02/2016) EF 20-25% RV normal            (06/2016)  Echo 06/15/16 EF 60-65%  SH: Lives in [place] with son and daughter, no ETOH and does not smoke, retired  [place]: Mother deceased: CAD?, HTN, lung cancer, afib        Father deceased: DM2, lung cancer, HTN, CAD stents        3 daughters (1 has afib)        1 son: healthy and alive  Review of systems complete and found to be negative unless listed in HPI.    Past Medical History:  Diagnosis Date  . Allergic rhinitis   . 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 Atrial fibrillation ([place])    . Chronic low back pain    Secondary to DJD  . Chronic systolic heart failure (HCC)    a. echo (5/15):  mod LVH, EF 20-25%, diff HK, severe LAE, mild RAE  . Coronary atherosclerosis-non obstructive    LHC (5/15):  EF 40-45% global HK; long LAD 40-60%, ostial 1st major septal perf 80-90%, ostial CFX ?%, mid AVCFX extensive Ca2+, dist PDA 50%  . Depression   . Essential hypertension, benign   . Fatty liver disease, nonalcoholic   . GERD (gastroesophageal reflux disease)   . Iron deficiency anemia   . Mitral regurgitation   . NICM (nonischemic cardiomyopathy) ([place])    a. echo (5/15):  mod LVH, EF 20-25%, diff HK, severe LAE, mild RAE  . NSTEMI (non-ST elevated myocardial infarction) ([place]) may 2015   No CAD  . Obesity   . Osteopenia   . PONV (postoperative nausea and vomiting)   . Rosacea   . Type 2 diabetes mellitus ([place])   . Vitamin D deficiency     Current Outpatient Medications  Medication Sig Dispense Refill  . amLODipine (NORVASC) 10 MG tablet Take 1 tablet (10 mg total) by mouth daily. 30 tablet 0  .  Calcium Carb-Cholecalciferol (CALCIUM 600 + D PO) Take 1 tablet by mouth 2 (two) times daily.     . carvedilol (COREG) 25 MG tablet TAKE (1) TABLET TWICE DAILY. 60 tablet 0  . colchicine 0.6 MG tablet Take 1 tablet (0.6 mg total) by mouth daily. 30 tablet 0  . dimenhyDRINATE (DRAMAMINE) 50 MG tablet Take 50 mg by mouth every 8 (eight) hours as needed for nausea.    . diphenhydrAMINE (BENADRYL) 25 MG tablet Take 25 mg by mouth daily as needed for itching.    Marland Kitchen doxycycline (VIBRAMYCIN) 100 MG capsule Take 100-200 mg by mouth 2 (two) times daily as needed (rosacea).     Marland Kitchen ELIQUIS 5 MG TABS tablet TAKE (1) TABLET TWICE DAILY. (Patient taking differently: Take 5 mg by mouth 2 (two) times daily. ) 60 tablet 3  . ferrous sulfate 325 (65 FE) MG tablet TAKE 1 TABLET ONCE DAILY WITH BREAKFAST. 30 tablet 0  . gabapentin (NEURONTIN) 300 MG capsule Take 1 capsule (300 mg total) by mouth 3  (three) times daily. 90 capsule 0  . HYDROcodone-acetaminophen (NORCO) 10-325 MG tablet Take 1 tablet by mouth every 6 (six) hours as needed for moderate pain (To be taken 3 hours after Dilaudid, if Dilaudid is ineffective).     Marland Kitchen HYDROmorphone (DILAUDID) 2 MG tablet Take 1 tablet (2 mg total) by mouth every 4 (four) hours as needed for severe pain. 20 tablet 0  . insulin glargine (LANTUS) 100 UNIT/ML injection Inject 0.6-1 mLs (60-100 Units total) into the skin 2 (two) times daily. 100 units in the morning and 60 units in the evening 10 mL 11  . LORazepam (ATIVAN) 0.5 MG tablet Take 1 tablet (0.5 mg total) by mouth every 8 (eight) hours as needed for anxiety. 15 tablet 0  . metolazone (ZAROXOLYN) 2.5 MG tablet Take 1 tablet (2.5mg  total) by mouth weekly every Tuesday AND  Friday with 40 mEq of Potassium. 30 tablet 0  . omeprazole (PRILOSEC) 20 MG capsule Take 20 mg by mouth 2 (two) times daily before a meal.    . ondansetron (ZOFRAN ODT) 4 MG disintegrating tablet Take 1 tablet (4 mg total) by mouth every 8 (eight) hours as needed for nausea or vomiting. 20 tablet 0  . polyethylene glycol (MIRALAX / GLYCOLAX) packet Take 17 g by mouth daily.    . potassium chloride SA (KLOR-CON) 20 MEQ tablet Take 2 tabs (40 meq) daily.  Take 2 additional tablets (40 mEq total) with each weekly Metolazone dose on Tues/Fri 120 tablet 2  . rosuvastatin (CRESTOR) 20 MG tablet Take 1 tablet (20 mg total) by mouth daily. 30 tablet 11  . senna-docusate (SENOKOT-S) 8.6-50 MG tablet Take 2 tablets by mouth 2 (two) times daily. 60 tablet 0  . sertraline (ZOLOFT) 100 MG tablet Take 100 mg by mouth daily.    Marland Kitchen torsemide (DEMADEX) 20 MG tablet Take 4 tablets (80 mg total) by mouth every morning AND 2 tablets (40 mg total) every evening. 540 tablet 3   No current facility-administered medications for this encounter.   Vitals:   05/07/19 1353  BP: (!) 148/82  Pulse: 75  SpO2: 93%  Weight: 112.3 kg (247 lb 9.6 oz)   Wt  Readings from Last 3 Encounters:  05/07/19 112.3 kg (247 lb 9.6 oz)  04/23/19 116.1 kg (256 lb)  02/17/19 116.6 kg (257 lb)   PHYSICAL EXAM: General:  Arrived in a wheel chair.  No resp difficulty HEENT: normal Neck: supple. JVP  5-6 . Carotids 2+ bilat; no bruits. No lymphadenopathy or thryomegaly appreciated. Cor: PMI nondisplaced. Regular rate & rhythm. No rubs, gallops or murmurs. Lungs: clear Abdomen: soft, nontender, nondistended. No hepatosplenomegaly. No bruits or masses. Good bowel sounds. Extremities: no cyanosis, clubbing, rash, R and LLE trace edema Neuro: alert & orientedx3, cranial nerves grossly intact. moves all 4 extremities w/o difficulty. Affect pleasant   ASSESSMENT & PLAN: 1) Acute/Chronic systolic HF: NICM, EF [id number], RV nl (02/2014).  ECHO 09/2014 EF 20-25%. ECHO 01/2016 EF 20-25%.  - s/p CRT-D 1/18 -> Echo 3/18 EF 60-65% EF recovered with CRT-D - ECHO 01/2019 EF 60-65%.  - NYHA III.Volume status much improved. Optivol - fluid index back down. Impedance back up.   torsemide to 80 mg in am and 40 mg pm. Cut back metolazone to every Friday.   - Continue carvedilol 25 mg twice a day.   2) CAD: Moderate diffuse mid LAD disease of 40-60%. Septal perforator branch with ostial ~90%.  No chest pain.   - Continue statin and bb.   3) CKD stage III: - BMET today  4) Chronic Afib:  - Rate controlled.  - Continue eliquis 5 mg BID.   5) LBBB-  - s/p CRT-D 1/18 6) HTN Stable.  7) Back pain Followed PT.  Follow up in 3 months. BMET.   Darrick Grinder, NP  05/07/2019

## 2019-05-15 ENCOUNTER — Other Ambulatory Visit: Payer: Self-pay

## 2019-05-15 ENCOUNTER — Ambulatory Visit (HOSPITAL_COMMUNITY)
Admission: RE | Admit: 2019-05-15 | Discharge: 2019-05-15 | Disposition: A | Discharge status: Home / Self Care | Payer: Medicare Other | Source: Ambulatory Visit | Attending: Cardiology | Admitting: Cardiology

## 2019-05-15 DIAGNOSIS — I5022 Chronic systolic (congestive) heart failure: Secondary | ICD-10-CM | POA: Diagnosis not present

## 2019-05-15 LAB — BASIC METABOLIC PANEL
Anion gap: 12 (ref 5–15)
BUN: 23 mg/dL (ref 8–23)
CO2: 30 mmol/L (ref 22–32)
Calcium: 8.8 mg/dL — ABNORMAL LOW (ref 8.9–10.3)
Chloride: 94 mmol/L — ABNORMAL LOW (ref 98–111)
Creatinine, Ser: 2.15 mg/dL — ABNORMAL HIGH (ref 0.44–1.00)
GFR calc Af Amer: 25 mL/min — ABNORMAL LOW (ref >60)
GFR calc non Af Amer: 22 mL/min — ABNORMAL LOW (ref >60)
Glucose, Bld: 260 mg/dL — ABNORMAL HIGH (ref 70–99)
Potassium: 4 mmol/L (ref 3.5–5.1)
Sodium: 136 mmol/L (ref 135–145)

## 2019-05-30 ENCOUNTER — Other Ambulatory Visit (HOSPITAL_COMMUNITY): Payer: Self-pay | Admitting: Internal Medicine

## 2019-05-30 DIAGNOSIS — I5022 Chronic systolic (congestive) heart failure: Secondary | ICD-10-CM

## 2019-06-04 ENCOUNTER — Other Ambulatory Visit: Payer: Self-pay | Admitting: Internal Medicine

## 2019-06-05 ENCOUNTER — Telehealth: Payer: Self-pay

## 2019-06-05 NOTE — Telephone Encounter (Signed)
Left message for patient to remind of missed remote transmission.  

## 2019-06-07 ENCOUNTER — Other Ambulatory Visit (HOSPITAL_COMMUNITY): Payer: Medicare Other

## 2019-06-08 ENCOUNTER — Telehealth (HOSPITAL_COMMUNITY): Payer: Self-pay | Admitting: Cardiology

## 2019-06-08 ENCOUNTER — Ambulatory Visit (HOSPITAL_COMMUNITY)
Admission: RE | Admit: 2019-06-08 | Discharge: 2019-06-08 | Disposition: A | Discharge status: Home / Self Care | Payer: Medicare Other | Source: Ambulatory Visit | Attending: Cardiology | Admitting: Cardiology

## 2019-06-08 ENCOUNTER — Other Ambulatory Visit: Payer: Self-pay

## 2019-06-08 ENCOUNTER — Telehealth (HOSPITAL_COMMUNITY): Payer: Self-pay | Admitting: *Deleted

## 2019-06-08 ENCOUNTER — Telehealth: Payer: Self-pay

## 2019-06-08 DIAGNOSIS — I5022 Chronic systolic (congestive) heart failure: Secondary | ICD-10-CM | POA: Diagnosis not present

## 2019-06-08 LAB — BASIC METABOLIC PANEL
Anion gap: 14 (ref 5–15)
BUN: 36 mg/dL — ABNORMAL HIGH (ref 8–23)
CO2: 33 mmol/L — ABNORMAL HIGH (ref 22–32)
Calcium: 8.4 mg/dL — ABNORMAL LOW (ref 8.9–10.3)
Chloride: 92 mmol/L — ABNORMAL LOW (ref 98–111)
Creatinine, Ser: 2.2 mg/dL — ABNORMAL HIGH (ref 0.44–1.00)
GFR calc Af Amer: 24 mL/min — ABNORMAL LOW (ref >60)
GFR calc non Af Amer: 21 mL/min — ABNORMAL LOW (ref >60)
Glucose, Bld: 148 mg/dL — ABNORMAL HIGH (ref 70–99)
Potassium: 2.7 mmol/L — CL (ref 3.5–5.1)
Sodium: 139 mmol/L (ref 135–145)

## 2019-06-08 NOTE — Telephone Encounter (Signed)
Attempted return call to patient's daughter Victoria Adams on [facility] regarding refills needed for Torsemide and Potassium.  Left message that both prescriptions were updated in January with the dosages and sent to [facility] in [place].  Advised to call pharmacy about the medication refills that are on file.

## 2019-06-08 NOTE — Telephone Encounter (Signed)
Abnormal labs from [facility] lab K 2.7  Per VO Dr Aundra Dubin Take and additional 80 meq now and continue current potassium regimen (40 BID add'l 40 on Tues/Thurs) Repeat in one week   Attempted to contact patient at [phone] (M) No answer unable to leave message

## 2019-06-08 NOTE — Telephone Encounter (Signed)
pts daughter called requesting refill of torsemide and potassium stating she has been out of medication since Sunday. I saw a note where Garlan Fair, CMA had been trying to contact patient about lab results since 05/17/19. Per Darrick Grinder NP "Renal function elevated. Stop metolazone. Hold potassium and torsemide for 2 days. Then start torsemide 60 mg twice a day + 40 meq potassium twice a day. Repeat BMET 7 days". I asked patients daughter to please bring patient in for repeat lab work before restarting her medications since we were never able to adjust her medications. Daughter agreed and will bring patient in for labs today.   Routed to Amy Clegg,NP

## 2019-06-08 NOTE — Telephone Encounter (Signed)
Please call restart torsemide 60 mg twice a day + K dur 40 meq twice a day.   BMET next week.   Thanks . Amy Clegg NP-C

## 2019-06-11 ENCOUNTER — Other Ambulatory Visit (HOSPITAL_COMMUNITY): Payer: Self-pay | Admitting: *Deleted

## 2019-06-11 MED ORDER — TORSEMIDE 20 MG PO TABS: ORAL_TABLET | ORAL | 3 refills | Status: DC

## 2019-06-11 MED ORDER — POTASSIUM CHLORIDE CRYS ER 20 MEQ PO TBCR: 40.0000 meq | EXTENDED_RELEASE_TABLET | Freq: Two times a day (BID) | ORAL | 2 refills | Status: DC

## 2019-06-11 NOTE — Addendum Note (Signed)
Addended by: Harvie Junior on: 06/11/2019 02:49 PM   Modules accepted: Orders

## 2019-06-11 NOTE — Telephone Encounter (Signed)
Called and spoke with patients daughter Beckie Busing. She is aware and agreeable with plan. Lab appt scheduled.

## 2019-06-12 ENCOUNTER — Other Ambulatory Visit (HOSPITAL_COMMUNITY): Payer: Self-pay | Admitting: *Deleted

## 2019-06-12 MED ORDER — TORSEMIDE 20 MG PO TABS: ORAL_TABLET | ORAL | 3 refills | Status: DC

## 2019-06-13 NOTE — Progress Notes (Signed)
Patient/daughter not participating in ICM follow up at this time and no further ICM follow up at this time.  Device clinic will continue to monitor remote transmissions every 91 days per protocol.

## 2019-06-18 ENCOUNTER — Other Ambulatory Visit (HOSPITAL_COMMUNITY): Payer: Medicare Other

## 2019-06-21 ENCOUNTER — Other Ambulatory Visit: Payer: Self-pay

## 2019-06-21 ENCOUNTER — Ambulatory Visit (HOSPITAL_COMMUNITY)
Admission: RE | Admit: 2019-06-21 | Discharge: 2019-06-21 | Disposition: A | Discharge status: Home / Self Care | Payer: Medicare Other | Source: Ambulatory Visit | Attending: Cardiology | Admitting: Cardiology

## 2019-06-21 DIAGNOSIS — I5022 Chronic systolic (congestive) heart failure: Secondary | ICD-10-CM | POA: Insufficient documentation

## 2019-06-21 LAB — BASIC METABOLIC PANEL
Anion gap: 10 (ref 5–15)
BUN: 23 mg/dL (ref 8–23)
CO2: 26 mmol/L (ref 22–32)
Calcium: 8.6 mg/dL — ABNORMAL LOW (ref 8.9–10.3)
Chloride: 101 mmol/L (ref 98–111)
Creatinine, Ser: 1.98 mg/dL — ABNORMAL HIGH (ref 0.44–1.00)
GFR calc Af Amer: 28 mL/min — ABNORMAL LOW (ref >60)
GFR calc non Af Amer: 24 mL/min — ABNORMAL LOW (ref >60)
Glucose, Bld: 250 mg/dL — ABNORMAL HIGH (ref 70–99)
Potassium: 4.3 mmol/L (ref 3.5–5.1)
Sodium: 137 mmol/L (ref 135–145)

## 2019-06-26 DIAGNOSIS — R4582 Worries: Secondary | ICD-10-CM | POA: Diagnosis not present

## 2019-06-26 DIAGNOSIS — G6289 Other specified polyneuropathies: Secondary | ICD-10-CM | POA: Diagnosis not present

## 2019-06-26 DIAGNOSIS — I25119 Atherosclerotic heart disease of native coronary artery with unspecified angina pectoris: Secondary | ICD-10-CM | POA: Diagnosis not present

## 2019-06-26 DIAGNOSIS — F331 Major depressive disorder, recurrent, moderate: Secondary | ICD-10-CM | POA: Diagnosis not present

## 2019-06-26 DIAGNOSIS — Z79891 Long term (current) use of opiate analgesic: Secondary | ICD-10-CM | POA: Diagnosis not present

## 2019-06-26 DIAGNOSIS — J301 Allergic rhinitis due to pollen: Secondary | ICD-10-CM | POA: Diagnosis not present

## 2019-06-26 DIAGNOSIS — I5032 Chronic diastolic (congestive) heart failure: Secondary | ICD-10-CM | POA: Diagnosis not present

## 2019-06-26 DIAGNOSIS — I110 Hypertensive heart disease with heart failure: Secondary | ICD-10-CM | POA: Diagnosis not present

## 2019-06-26 DIAGNOSIS — E1122 Type 2 diabetes mellitus with diabetic chronic kidney disease: Secondary | ICD-10-CM | POA: Diagnosis not present

## 2019-06-26 DIAGNOSIS — R1084 Generalized abdominal pain: Secondary | ICD-10-CM | POA: Diagnosis not present

## 2019-06-26 DIAGNOSIS — E782 Mixed hyperlipidemia: Secondary | ICD-10-CM | POA: Diagnosis not present

## 2019-06-26 DIAGNOSIS — I482 Chronic atrial fibrillation, unspecified: Secondary | ICD-10-CM | POA: Diagnosis not present

## 2019-06-26 DIAGNOSIS — K219 Gastro-esophageal reflux disease without esophagitis: Secondary | ICD-10-CM | POA: Diagnosis not present

## 2019-07-05 ENCOUNTER — Other Ambulatory Visit (HOSPITAL_COMMUNITY): Payer: Self-pay | Admitting: Internal Medicine

## 2019-07-05 DIAGNOSIS — I5022 Chronic systolic (congestive) heart failure: Secondary | ICD-10-CM

## 2019-08-07 ENCOUNTER — Inpatient Hospital Stay (HOSPITAL_COMMUNITY): Admission: RE | Admit: 2019-08-07 | Payer: Medicare Other | Source: Ambulatory Visit | Admitting: Internal Medicine

## 2019-08-13 ENCOUNTER — Ambulatory Visit (INDEPENDENT_AMBULATORY_CARE_PROVIDER_SITE_OTHER): Payer: Medicare Other | Admitting: *Deleted

## 2019-08-13 DIAGNOSIS — I428 Other cardiomyopathies: Secondary | ICD-10-CM

## 2019-08-13 DIAGNOSIS — I5022 Chronic systolic (congestive) heart failure: Secondary | ICD-10-CM

## 2019-08-13 LAB — CUP PACEART REMOTE DEVICE CHECK
Battery Remaining Longevity: 56 mo
Battery Voltage: 2.97 V
Brady Statistic AP VP Percent: 0 %
Brady Statistic AP VS Percent: 0 %
Brady Statistic AS VP Percent: 0 %
Brady Statistic AS VS Percent: 0 %
Brady Statistic RA Percent Paced: 0 %
Brady Statistic RV Percent Paced: 88.65 %
Date Time Interrogation Session: 20210502090959
HighPow Impedance: 71 Ohm
Implantable Lead Implant Date: 20180105
Implantable Lead Implant Date: 20180105
Implantable Lead Location: 753858
Implantable Lead Location: 753860
Implantable Lead Model: 4598
Implantable Pulse Generator Implant Date: 20180105
Lead Channel Impedance Value: 208.568
Lead Channel Impedance Value: 218.5 Ohm
Lead Channel Impedance Value: 234.706
Lead Channel Impedance Value: 247.358
Lead Channel Impedance Value: 247.358
Lead Channel Impedance Value: 399 Ohm
Lead Channel Impedance Value: 4047 Ohm
Lead Channel Impedance Value: 437 Ohm
Lead Channel Impedance Value: 437 Ohm
Lead Channel Impedance Value: 570 Ohm
Lead Channel Impedance Value: 570 Ohm
Lead Channel Impedance Value: 589 Ohm
Lead Channel Impedance Value: 703 Ohm
Lead Channel Impedance Value: 703 Ohm
Lead Channel Impedance Value: 760 Ohm
Lead Channel Impedance Value: 855 Ohm
Lead Channel Impedance Value: 912 Ohm
Lead Channel Impedance Value: 912 Ohm
Lead Channel Pacing Threshold Amplitude: 0.5 V
Lead Channel Pacing Threshold Amplitude: 1 V
Lead Channel Pacing Threshold Pulse Width: 0.4 ms
Lead Channel Pacing Threshold Pulse Width: 0.8 ms
Lead Channel Sensing Intrinsic Amplitude: 20.25 mV
Lead Channel Sensing Intrinsic Amplitude: 20.25 mV
Lead Channel Setting Pacing Amplitude: 1.5 V
Lead Channel Setting Pacing Amplitude: 2 V
Lead Channel Setting Pacing Pulse Width: 0.4 ms
Lead Channel Setting Pacing Pulse Width: 0.8 ms
Lead Channel Setting Sensing Sensitivity: 0.3 mV

## 2019-08-13 NOTE — Progress Notes (Signed)
Remote ICD transmission.   

## 2019-09-16 DIAGNOSIS — E119 Type 2 diabetes mellitus without complications: Secondary | ICD-10-CM | POA: Diagnosis not present

## 2019-09-16 DIAGNOSIS — I639 Cerebral infarction, unspecified: Secondary | ICD-10-CM | POA: Diagnosis not present

## 2019-09-16 DIAGNOSIS — G319 Degenerative disease of nervous system, unspecified: Secondary | ICD-10-CM | POA: Diagnosis not present

## 2019-09-16 DIAGNOSIS — R1111 Vomiting without nausea: Secondary | ICD-10-CM | POA: Diagnosis not present

## 2019-09-16 DIAGNOSIS — E876 Hypokalemia: Secondary | ICD-10-CM | POA: Diagnosis not present

## 2019-09-16 DIAGNOSIS — R519 Headache, unspecified: Secondary | ICD-10-CM | POA: Diagnosis not present

## 2019-09-16 DIAGNOSIS — R110 Nausea: Secondary | ICD-10-CM | POA: Diagnosis not present

## 2019-09-16 DIAGNOSIS — Z8673 Personal history of transient ischemic attack (TIA), and cerebral infarction without residual deficits: Secondary | ICD-10-CM | POA: Diagnosis not present

## 2019-09-16 DIAGNOSIS — R112 Nausea with vomiting, unspecified: Secondary | ICD-10-CM | POA: Diagnosis not present

## 2019-09-16 DIAGNOSIS — R52 Pain, unspecified: Secondary | ICD-10-CM | POA: Diagnosis not present

## 2019-09-16 DIAGNOSIS — R1110 Vomiting, unspecified: Secondary | ICD-10-CM | POA: Diagnosis not present

## 2019-09-16 DIAGNOSIS — I6502 Occlusion and stenosis of left vertebral artery: Secondary | ICD-10-CM | POA: Diagnosis not present

## 2019-09-16 DIAGNOSIS — R0989 Other specified symptoms and signs involving the circulatory and respiratory systems: Secondary | ICD-10-CM | POA: Diagnosis not present

## 2019-09-16 DIAGNOSIS — R509 Fever, unspecified: Secondary | ICD-10-CM | POA: Diagnosis not present

## 2019-09-16 DIAGNOSIS — G4489 Other headache syndrome: Secondary | ICD-10-CM | POA: Diagnosis not present

## 2019-09-16 DIAGNOSIS — I10 Essential (primary) hypertension: Secondary | ICD-10-CM | POA: Diagnosis not present

## 2019-09-16 DIAGNOSIS — Z20822 Contact with and (suspected) exposure to covid-19: Secondary | ICD-10-CM | POA: Diagnosis not present

## 2019-09-17 DIAGNOSIS — I4891 Unspecified atrial fibrillation: Secondary | ICD-10-CM | POA: Diagnosis present

## 2019-09-17 DIAGNOSIS — M5481 Occipital neuralgia: Secondary | ICD-10-CM | POA: Diagnosis not present

## 2019-09-17 DIAGNOSIS — I480 Paroxysmal atrial fibrillation: Secondary | ICD-10-CM | POA: Diagnosis not present

## 2019-09-17 DIAGNOSIS — E785 Hyperlipidemia, unspecified: Secondary | ICD-10-CM | POA: Diagnosis not present

## 2019-09-17 DIAGNOSIS — I6502 Occlusion and stenosis of left vertebral artery: Secondary | ICD-10-CM | POA: Diagnosis not present

## 2019-09-17 DIAGNOSIS — I618 Other nontraumatic intracerebral hemorrhage: Secondary | ICD-10-CM | POA: Diagnosis present

## 2019-09-17 DIAGNOSIS — I609 Nontraumatic subarachnoid hemorrhage, unspecified: Secondary | ICD-10-CM | POA: Diagnosis present

## 2019-09-17 DIAGNOSIS — I63532 Cerebral infarction due to unspecified occlusion or stenosis of left posterior cerebral artery: Secondary | ICD-10-CM | POA: Diagnosis not present

## 2019-09-17 DIAGNOSIS — R4189 Other symptoms and signs involving cognitive functions and awareness: Secondary | ICD-10-CM | POA: Diagnosis not present

## 2019-09-17 DIAGNOSIS — H53461 Homonymous bilateral field defects, right side: Secondary | ICD-10-CM | POA: Diagnosis not present

## 2019-09-17 DIAGNOSIS — E118 Type 2 diabetes mellitus with unspecified complications: Secondary | ICD-10-CM | POA: Diagnosis not present

## 2019-09-17 DIAGNOSIS — N183 Chronic kidney disease, stage 3 unspecified: Secondary | ICD-10-CM | POA: Diagnosis present

## 2019-09-17 DIAGNOSIS — Z8673 Personal history of transient ischemic attack (TIA), and cerebral infarction without residual deficits: Secondary | ICD-10-CM | POA: Diagnosis not present

## 2019-09-17 DIAGNOSIS — G9340 Encephalopathy, unspecified: Secondary | ICD-10-CM | POA: Diagnosis present

## 2019-09-17 DIAGNOSIS — G319 Degenerative disease of nervous system, unspecified: Secondary | ICD-10-CM | POA: Diagnosis not present

## 2019-09-17 DIAGNOSIS — E1122 Type 2 diabetes mellitus with diabetic chronic kidney disease: Secondary | ICD-10-CM | POA: Diagnosis present

## 2019-09-17 DIAGNOSIS — I081 Rheumatic disorders of both mitral and tricuspid valves: Secondary | ICD-10-CM | POA: Diagnosis not present

## 2019-09-17 DIAGNOSIS — G936 Cerebral edema: Secondary | ICD-10-CM | POA: Diagnosis present

## 2019-09-17 DIAGNOSIS — I10 Essential (primary) hypertension: Secondary | ICD-10-CM | POA: Diagnosis not present

## 2019-09-17 DIAGNOSIS — Z950 Presence of cardiac pacemaker: Secondary | ICD-10-CM | POA: Diagnosis not present

## 2019-09-17 DIAGNOSIS — I6200 Nontraumatic subdural hemorrhage, unspecified: Secondary | ICD-10-CM | POA: Diagnosis present

## 2019-09-17 DIAGNOSIS — Z79899 Other long term (current) drug therapy: Secondary | ICD-10-CM | POA: Diagnosis not present

## 2019-09-17 DIAGNOSIS — R471 Dysarthria and anarthria: Secondary | ICD-10-CM | POA: Diagnosis present

## 2019-09-17 DIAGNOSIS — R9082 White matter disease, unspecified: Secondary | ICD-10-CM | POA: Diagnosis not present

## 2019-09-17 DIAGNOSIS — E871 Hypo-osmolality and hyponatremia: Secondary | ICD-10-CM | POA: Diagnosis not present

## 2019-09-17 DIAGNOSIS — R29705 NIHSS score 5: Secondary | ICD-10-CM | POA: Diagnosis present

## 2019-09-17 DIAGNOSIS — I129 Hypertensive chronic kidney disease with stage 1 through stage 4 chronic kidney disease, or unspecified chronic kidney disease: Secondary | ICD-10-CM | POA: Diagnosis present

## 2019-09-17 DIAGNOSIS — Z7901 Long term (current) use of anticoagulants: Secondary | ICD-10-CM | POA: Diagnosis not present

## 2019-09-17 DIAGNOSIS — I639 Cerebral infarction, unspecified: Secondary | ICD-10-CM | POA: Diagnosis not present

## 2019-09-17 DIAGNOSIS — Z794 Long term (current) use of insulin: Secondary | ICD-10-CM | POA: Diagnosis not present

## 2019-09-17 DIAGNOSIS — R4701 Aphasia: Secondary | ICD-10-CM | POA: Diagnosis present

## 2019-09-20 ENCOUNTER — Other Ambulatory Visit (HOSPITAL_COMMUNITY): Payer: Self-pay | Admitting: Internal Medicine

## 2019-09-20 DIAGNOSIS — I5022 Chronic systolic (congestive) heart failure: Secondary | ICD-10-CM

## 2019-10-02 DIAGNOSIS — F339 Major depressive disorder, recurrent, unspecified: Secondary | ICD-10-CM | POA: Diagnosis not present

## 2019-10-02 DIAGNOSIS — E876 Hypokalemia: Secondary | ICD-10-CM | POA: Diagnosis not present

## 2019-10-02 DIAGNOSIS — I69398 Other sequelae of cerebral infarction: Secondary | ICD-10-CM | POA: Diagnosis not present

## 2019-10-02 DIAGNOSIS — Z7901 Long term (current) use of anticoagulants: Secondary | ICD-10-CM | POA: Diagnosis not present

## 2019-10-02 DIAGNOSIS — I4891 Unspecified atrial fibrillation: Secondary | ICD-10-CM | POA: Diagnosis not present

## 2019-10-02 DIAGNOSIS — I10 Essential (primary) hypertension: Secondary | ICD-10-CM | POA: Diagnosis not present

## 2019-10-02 DIAGNOSIS — E1165 Type 2 diabetes mellitus with hyperglycemia: Secondary | ICD-10-CM | POA: Diagnosis not present

## 2019-10-02 DIAGNOSIS — R2689 Other abnormalities of gait and mobility: Secondary | ICD-10-CM | POA: Diagnosis not present

## 2019-10-02 DIAGNOSIS — M5481 Occipital neuralgia: Secondary | ICD-10-CM | POA: Diagnosis not present

## 2019-10-02 DIAGNOSIS — Z794 Long term (current) use of insulin: Secondary | ICD-10-CM | POA: Diagnosis not present

## 2019-10-02 DIAGNOSIS — Z950 Presence of cardiac pacemaker: Secondary | ICD-10-CM | POA: Diagnosis not present

## 2019-10-03 DIAGNOSIS — I4891 Unspecified atrial fibrillation: Secondary | ICD-10-CM | POA: Diagnosis not present

## 2019-10-03 DIAGNOSIS — F339 Major depressive disorder, recurrent, unspecified: Secondary | ICD-10-CM | POA: Diagnosis not present

## 2019-10-03 DIAGNOSIS — E1165 Type 2 diabetes mellitus with hyperglycemia: Secondary | ICD-10-CM | POA: Diagnosis not present

## 2019-10-03 DIAGNOSIS — I69398 Other sequelae of cerebral infarction: Secondary | ICD-10-CM | POA: Diagnosis not present

## 2019-10-03 DIAGNOSIS — R2689 Other abnormalities of gait and mobility: Secondary | ICD-10-CM | POA: Diagnosis not present

## 2019-10-03 DIAGNOSIS — M5481 Occipital neuralgia: Secondary | ICD-10-CM | POA: Diagnosis not present

## 2019-10-04 DIAGNOSIS — I4891 Unspecified atrial fibrillation: Secondary | ICD-10-CM | POA: Diagnosis not present

## 2019-10-04 DIAGNOSIS — E1165 Type 2 diabetes mellitus with hyperglycemia: Secondary | ICD-10-CM | POA: Diagnosis not present

## 2019-10-04 DIAGNOSIS — F339 Major depressive disorder, recurrent, unspecified: Secondary | ICD-10-CM | POA: Diagnosis not present

## 2019-10-04 DIAGNOSIS — M5481 Occipital neuralgia: Secondary | ICD-10-CM | POA: Diagnosis not present

## 2019-10-04 DIAGNOSIS — R2689 Other abnormalities of gait and mobility: Secondary | ICD-10-CM | POA: Diagnosis not present

## 2019-10-04 DIAGNOSIS — I69398 Other sequelae of cerebral infarction: Secondary | ICD-10-CM | POA: Diagnosis not present

## 2019-10-10 DIAGNOSIS — I69398 Other sequelae of cerebral infarction: Secondary | ICD-10-CM | POA: Diagnosis not present

## 2019-10-10 DIAGNOSIS — I4891 Unspecified atrial fibrillation: Secondary | ICD-10-CM | POA: Diagnosis not present

## 2019-10-10 DIAGNOSIS — F339 Major depressive disorder, recurrent, unspecified: Secondary | ICD-10-CM | POA: Diagnosis not present

## 2019-10-10 DIAGNOSIS — E1165 Type 2 diabetes mellitus with hyperglycemia: Secondary | ICD-10-CM | POA: Diagnosis not present

## 2019-10-10 DIAGNOSIS — R2689 Other abnormalities of gait and mobility: Secondary | ICD-10-CM | POA: Diagnosis not present

## 2019-10-10 DIAGNOSIS — M5481 Occipital neuralgia: Secondary | ICD-10-CM | POA: Diagnosis not present

## 2019-10-11 DIAGNOSIS — I4891 Unspecified atrial fibrillation: Secondary | ICD-10-CM | POA: Diagnosis not present

## 2019-10-11 DIAGNOSIS — E1165 Type 2 diabetes mellitus with hyperglycemia: Secondary | ICD-10-CM | POA: Diagnosis not present

## 2019-10-11 DIAGNOSIS — I69398 Other sequelae of cerebral infarction: Secondary | ICD-10-CM | POA: Diagnosis not present

## 2019-10-11 DIAGNOSIS — R2689 Other abnormalities of gait and mobility: Secondary | ICD-10-CM | POA: Diagnosis not present

## 2019-10-11 DIAGNOSIS — F339 Major depressive disorder, recurrent, unspecified: Secondary | ICD-10-CM | POA: Diagnosis not present

## 2019-10-11 DIAGNOSIS — M5481 Occipital neuralgia: Secondary | ICD-10-CM | POA: Diagnosis not present

## 2019-10-16 ENCOUNTER — Other Ambulatory Visit (HOSPITAL_COMMUNITY): Payer: Self-pay | Admitting: Adult Health

## 2019-10-17 DIAGNOSIS — I4891 Unspecified atrial fibrillation: Secondary | ICD-10-CM | POA: Diagnosis not present

## 2019-10-17 DIAGNOSIS — E1165 Type 2 diabetes mellitus with hyperglycemia: Secondary | ICD-10-CM | POA: Diagnosis not present

## 2019-10-17 DIAGNOSIS — R2689 Other abnormalities of gait and mobility: Secondary | ICD-10-CM | POA: Diagnosis not present

## 2019-10-17 DIAGNOSIS — M5481 Occipital neuralgia: Secondary | ICD-10-CM | POA: Diagnosis not present

## 2019-10-17 DIAGNOSIS — F339 Major depressive disorder, recurrent, unspecified: Secondary | ICD-10-CM | POA: Diagnosis not present

## 2019-10-17 DIAGNOSIS — I69398 Other sequelae of cerebral infarction: Secondary | ICD-10-CM | POA: Diagnosis not present

## 2019-10-18 DIAGNOSIS — E1165 Type 2 diabetes mellitus with hyperglycemia: Secondary | ICD-10-CM | POA: Diagnosis not present

## 2019-10-18 DIAGNOSIS — R2689 Other abnormalities of gait and mobility: Secondary | ICD-10-CM | POA: Diagnosis not present

## 2019-10-18 DIAGNOSIS — I4891 Unspecified atrial fibrillation: Secondary | ICD-10-CM | POA: Diagnosis not present

## 2019-10-18 DIAGNOSIS — I69398 Other sequelae of cerebral infarction: Secondary | ICD-10-CM | POA: Diagnosis not present

## 2019-10-18 DIAGNOSIS — F339 Major depressive disorder, recurrent, unspecified: Secondary | ICD-10-CM | POA: Diagnosis not present

## 2019-10-18 DIAGNOSIS — M5481 Occipital neuralgia: Secondary | ICD-10-CM | POA: Diagnosis not present

## 2019-10-22 DIAGNOSIS — R2689 Other abnormalities of gait and mobility: Secondary | ICD-10-CM | POA: Diagnosis not present

## 2019-10-22 DIAGNOSIS — E1165 Type 2 diabetes mellitus with hyperglycemia: Secondary | ICD-10-CM | POA: Diagnosis not present

## 2019-10-22 DIAGNOSIS — I4891 Unspecified atrial fibrillation: Secondary | ICD-10-CM | POA: Diagnosis not present

## 2019-10-22 DIAGNOSIS — M5481 Occipital neuralgia: Secondary | ICD-10-CM | POA: Diagnosis not present

## 2019-10-22 DIAGNOSIS — I69398 Other sequelae of cerebral infarction: Secondary | ICD-10-CM | POA: Diagnosis not present

## 2019-10-22 DIAGNOSIS — F339 Major depressive disorder, recurrent, unspecified: Secondary | ICD-10-CM | POA: Diagnosis not present

## 2019-10-24 DIAGNOSIS — E1165 Type 2 diabetes mellitus with hyperglycemia: Secondary | ICD-10-CM | POA: Diagnosis not present

## 2019-10-24 DIAGNOSIS — I4891 Unspecified atrial fibrillation: Secondary | ICD-10-CM | POA: Diagnosis not present

## 2019-10-24 DIAGNOSIS — F339 Major depressive disorder, recurrent, unspecified: Secondary | ICD-10-CM | POA: Diagnosis not present

## 2019-10-24 DIAGNOSIS — I69398 Other sequelae of cerebral infarction: Secondary | ICD-10-CM | POA: Diagnosis not present

## 2019-10-24 DIAGNOSIS — R2689 Other abnormalities of gait and mobility: Secondary | ICD-10-CM | POA: Diagnosis not present

## 2019-10-24 DIAGNOSIS — M5481 Occipital neuralgia: Secondary | ICD-10-CM | POA: Diagnosis not present

## 2019-10-25 DIAGNOSIS — E1165 Type 2 diabetes mellitus with hyperglycemia: Secondary | ICD-10-CM | POA: Diagnosis not present

## 2019-10-25 DIAGNOSIS — I4891 Unspecified atrial fibrillation: Secondary | ICD-10-CM | POA: Diagnosis not present

## 2019-10-25 DIAGNOSIS — M5481 Occipital neuralgia: Secondary | ICD-10-CM | POA: Diagnosis not present

## 2019-10-25 DIAGNOSIS — I69398 Other sequelae of cerebral infarction: Secondary | ICD-10-CM | POA: Diagnosis not present

## 2019-10-25 DIAGNOSIS — F339 Major depressive disorder, recurrent, unspecified: Secondary | ICD-10-CM | POA: Diagnosis not present

## 2019-10-25 DIAGNOSIS — R2689 Other abnormalities of gait and mobility: Secondary | ICD-10-CM | POA: Diagnosis not present

## 2019-10-30 ENCOUNTER — Other Ambulatory Visit (HOSPITAL_COMMUNITY): Payer: Self-pay | Admitting: Adult Health

## 2019-10-31 DIAGNOSIS — H53469 Homonymous bilateral field defects, unspecified side: Secondary | ICD-10-CM | POA: Diagnosis not present

## 2019-10-31 DIAGNOSIS — F331 Major depressive disorder, recurrent, moderate: Secondary | ICD-10-CM | POA: Diagnosis not present

## 2019-10-31 DIAGNOSIS — M5481 Occipital neuralgia: Secondary | ICD-10-CM | POA: Diagnosis not present

## 2019-10-31 DIAGNOSIS — E1165 Type 2 diabetes mellitus with hyperglycemia: Secondary | ICD-10-CM | POA: Diagnosis not present

## 2019-10-31 DIAGNOSIS — Z6836 Body mass index (BMI) 36.0-36.9, adult: Secondary | ICD-10-CM | POA: Diagnosis not present

## 2019-10-31 DIAGNOSIS — I4891 Unspecified atrial fibrillation: Secondary | ICD-10-CM | POA: Diagnosis not present

## 2019-10-31 DIAGNOSIS — R2689 Other abnormalities of gait and mobility: Secondary | ICD-10-CM | POA: Diagnosis not present

## 2019-10-31 DIAGNOSIS — F339 Major depressive disorder, recurrent, unspecified: Secondary | ICD-10-CM | POA: Diagnosis not present

## 2019-10-31 DIAGNOSIS — I69398 Other sequelae of cerebral infarction: Secondary | ICD-10-CM | POA: Diagnosis not present

## 2019-11-01 DIAGNOSIS — I10 Essential (primary) hypertension: Secondary | ICD-10-CM | POA: Diagnosis not present

## 2019-11-01 DIAGNOSIS — I4891 Unspecified atrial fibrillation: Secondary | ICD-10-CM | POA: Diagnosis not present

## 2019-11-01 DIAGNOSIS — R2689 Other abnormalities of gait and mobility: Secondary | ICD-10-CM | POA: Diagnosis not present

## 2019-11-01 DIAGNOSIS — F339 Major depressive disorder, recurrent, unspecified: Secondary | ICD-10-CM | POA: Diagnosis not present

## 2019-11-01 DIAGNOSIS — Z950 Presence of cardiac pacemaker: Secondary | ICD-10-CM | POA: Diagnosis not present

## 2019-11-01 DIAGNOSIS — Z7901 Long term (current) use of anticoagulants: Secondary | ICD-10-CM | POA: Diagnosis not present

## 2019-11-01 DIAGNOSIS — M5481 Occipital neuralgia: Secondary | ICD-10-CM | POA: Diagnosis not present

## 2019-11-01 DIAGNOSIS — E1165 Type 2 diabetes mellitus with hyperglycemia: Secondary | ICD-10-CM | POA: Diagnosis not present

## 2019-11-01 DIAGNOSIS — Z794 Long term (current) use of insulin: Secondary | ICD-10-CM | POA: Diagnosis not present

## 2019-11-01 DIAGNOSIS — E876 Hypokalemia: Secondary | ICD-10-CM | POA: Diagnosis not present

## 2019-11-01 DIAGNOSIS — I69398 Other sequelae of cerebral infarction: Secondary | ICD-10-CM | POA: Diagnosis not present

## 2019-11-02 DIAGNOSIS — I69398 Other sequelae of cerebral infarction: Secondary | ICD-10-CM | POA: Diagnosis not present

## 2019-11-02 DIAGNOSIS — I4891 Unspecified atrial fibrillation: Secondary | ICD-10-CM | POA: Diagnosis not present

## 2019-11-02 DIAGNOSIS — E1165 Type 2 diabetes mellitus with hyperglycemia: Secondary | ICD-10-CM | POA: Diagnosis not present

## 2019-11-02 DIAGNOSIS — M5481 Occipital neuralgia: Secondary | ICD-10-CM | POA: Diagnosis not present

## 2019-11-02 DIAGNOSIS — R2689 Other abnormalities of gait and mobility: Secondary | ICD-10-CM | POA: Diagnosis not present

## 2019-11-02 DIAGNOSIS — F339 Major depressive disorder, recurrent, unspecified: Secondary | ICD-10-CM | POA: Diagnosis not present

## 2019-11-05 DIAGNOSIS — F339 Major depressive disorder, recurrent, unspecified: Secondary | ICD-10-CM | POA: Diagnosis not present

## 2019-11-05 DIAGNOSIS — I69398 Other sequelae of cerebral infarction: Secondary | ICD-10-CM | POA: Diagnosis not present

## 2019-11-05 DIAGNOSIS — E1165 Type 2 diabetes mellitus with hyperglycemia: Secondary | ICD-10-CM | POA: Diagnosis not present

## 2019-11-05 DIAGNOSIS — R2689 Other abnormalities of gait and mobility: Secondary | ICD-10-CM | POA: Diagnosis not present

## 2019-11-05 DIAGNOSIS — M5481 Occipital neuralgia: Secondary | ICD-10-CM | POA: Diagnosis not present

## 2019-11-05 DIAGNOSIS — I4891 Unspecified atrial fibrillation: Secondary | ICD-10-CM | POA: Diagnosis not present

## 2019-11-20 ENCOUNTER — Other Ambulatory Visit (HOSPITAL_COMMUNITY): Payer: Self-pay | Admitting: Cardiology

## 2019-11-27 ENCOUNTER — Other Ambulatory Visit (HOSPITAL_COMMUNITY): Payer: Self-pay | Admitting: Adult Health

## 2019-12-18 ENCOUNTER — Other Ambulatory Visit (HOSPITAL_COMMUNITY): Payer: Self-pay | Admitting: Cardiology

## 2019-12-20 DIAGNOSIS — R1084 Generalized abdominal pain: Secondary | ICD-10-CM | POA: Diagnosis not present

## 2019-12-20 DIAGNOSIS — F331 Major depressive disorder, recurrent, moderate: Secondary | ICD-10-CM | POA: Diagnosis not present

## 2019-12-20 DIAGNOSIS — I482 Chronic atrial fibrillation, unspecified: Secondary | ICD-10-CM | POA: Diagnosis not present

## 2019-12-20 DIAGNOSIS — J301 Allergic rhinitis due to pollen: Secondary | ICD-10-CM | POA: Diagnosis not present

## 2019-12-20 DIAGNOSIS — I5032 Chronic diastolic (congestive) heart failure: Secondary | ICD-10-CM | POA: Diagnosis not present

## 2019-12-20 DIAGNOSIS — K219 Gastro-esophageal reflux disease without esophagitis: Secondary | ICD-10-CM | POA: Diagnosis not present

## 2019-12-20 DIAGNOSIS — R110 Nausea: Secondary | ICD-10-CM | POA: Diagnosis not present

## 2019-12-20 DIAGNOSIS — G6289 Other specified polyneuropathies: Secondary | ICD-10-CM | POA: Diagnosis not present

## 2019-12-20 DIAGNOSIS — I25119 Atherosclerotic heart disease of native coronary artery with unspecified angina pectoris: Secondary | ICD-10-CM | POA: Diagnosis not present

## 2019-12-20 DIAGNOSIS — E782 Mixed hyperlipidemia: Secondary | ICD-10-CM | POA: Diagnosis not present

## 2019-12-20 DIAGNOSIS — Z20828 Contact with and (suspected) exposure to other viral communicable diseases: Secondary | ICD-10-CM | POA: Diagnosis not present

## 2019-12-20 DIAGNOSIS — Z79891 Long term (current) use of opiate analgesic: Secondary | ICD-10-CM | POA: Diagnosis not present

## 2019-12-20 DIAGNOSIS — E1122 Type 2 diabetes mellitus with diabetic chronic kidney disease: Secondary | ICD-10-CM | POA: Diagnosis not present

## 2019-12-20 DIAGNOSIS — I110 Hypertensive heart disease with heart failure: Secondary | ICD-10-CM | POA: Diagnosis not present

## 2019-12-25 DIAGNOSIS — E782 Mixed hyperlipidemia: Secondary | ICD-10-CM | POA: Diagnosis not present

## 2019-12-25 DIAGNOSIS — I5032 Chronic diastolic (congestive) heart failure: Secondary | ICD-10-CM | POA: Diagnosis not present

## 2019-12-25 DIAGNOSIS — I10 Essential (primary) hypertension: Secondary | ICD-10-CM | POA: Diagnosis not present

## 2019-12-25 DIAGNOSIS — K219 Gastro-esophageal reflux disease without esophagitis: Secondary | ICD-10-CM | POA: Diagnosis not present

## 2019-12-25 DIAGNOSIS — N183 Chronic kidney disease, stage 3 unspecified: Secondary | ICD-10-CM | POA: Diagnosis not present

## 2019-12-25 DIAGNOSIS — E1165 Type 2 diabetes mellitus with hyperglycemia: Secondary | ICD-10-CM | POA: Diagnosis not present

## 2019-12-25 DIAGNOSIS — E1122 Type 2 diabetes mellitus with diabetic chronic kidney disease: Secondary | ICD-10-CM | POA: Diagnosis not present

## 2019-12-25 DIAGNOSIS — R5382 Chronic fatigue, unspecified: Secondary | ICD-10-CM | POA: Diagnosis not present

## 2019-12-25 DIAGNOSIS — Z79891 Long term (current) use of opiate analgesic: Secondary | ICD-10-CM | POA: Diagnosis not present

## 2019-12-27 DIAGNOSIS — I25119 Atherosclerotic heart disease of native coronary artery with unspecified angina pectoris: Secondary | ICD-10-CM | POA: Diagnosis not present

## 2019-12-27 DIAGNOSIS — Z23 Encounter for immunization: Secondary | ICD-10-CM | POA: Diagnosis not present

## 2019-12-27 DIAGNOSIS — E782 Mixed hyperlipidemia: Secondary | ICD-10-CM | POA: Diagnosis not present

## 2019-12-27 DIAGNOSIS — I5032 Chronic diastolic (congestive) heart failure: Secondary | ICD-10-CM | POA: Diagnosis not present

## 2019-12-27 DIAGNOSIS — I482 Chronic atrial fibrillation, unspecified: Secondary | ICD-10-CM | POA: Diagnosis not present

## 2019-12-27 DIAGNOSIS — I110 Hypertensive heart disease with heart failure: Secondary | ICD-10-CM | POA: Diagnosis not present

## 2019-12-27 DIAGNOSIS — R4582 Worries: Secondary | ICD-10-CM | POA: Diagnosis not present

## 2019-12-27 DIAGNOSIS — J301 Allergic rhinitis due to pollen: Secondary | ICD-10-CM | POA: Diagnosis not present

## 2019-12-27 DIAGNOSIS — Z0001 Encounter for general adult medical examination with abnormal findings: Secondary | ICD-10-CM | POA: Diagnosis not present

## 2019-12-27 DIAGNOSIS — G6289 Other specified polyneuropathies: Secondary | ICD-10-CM | POA: Diagnosis not present

## 2019-12-27 DIAGNOSIS — E1122 Type 2 diabetes mellitus with diabetic chronic kidney disease: Secondary | ICD-10-CM | POA: Diagnosis not present

## 2019-12-27 DIAGNOSIS — K219 Gastro-esophageal reflux disease without esophagitis: Secondary | ICD-10-CM | POA: Diagnosis not present

## 2019-12-27 DIAGNOSIS — Z79891 Long term (current) use of opiate analgesic: Secondary | ICD-10-CM | POA: Diagnosis not present

## 2019-12-27 DIAGNOSIS — R300 Dysuria: Secondary | ICD-10-CM | POA: Diagnosis not present

## 2019-12-27 DIAGNOSIS — F331 Major depressive disorder, recurrent, moderate: Secondary | ICD-10-CM | POA: Diagnosis not present

## 2019-12-27 DIAGNOSIS — R1084 Generalized abdominal pain: Secondary | ICD-10-CM | POA: Diagnosis not present

## 2019-12-29 ENCOUNTER — Other Ambulatory Visit (HOSPITAL_COMMUNITY): Payer: Self-pay | Admitting: Adult Health

## 2019-12-31 ENCOUNTER — Ambulatory Visit (INDEPENDENT_AMBULATORY_CARE_PROVIDER_SITE_OTHER): Payer: Medicare Other | Admitting: *Deleted

## 2019-12-31 DIAGNOSIS — I428 Other cardiomyopathies: Secondary | ICD-10-CM

## 2020-01-01 LAB — CUP PACEART REMOTE DEVICE CHECK
Battery Remaining Longevity: 47 mo
Battery Voltage: 2.96 V
Brady Statistic AP VP Percent: 0 %
Brady Statistic AP VS Percent: 0 %
Brady Statistic AS VP Percent: 0 %
Brady Statistic AS VS Percent: 0 %
Brady Statistic RA Percent Paced: 0 %
Brady Statistic RV Percent Paced: 85.68 %
Date Time Interrogation Session: 20210919031704
HighPow Impedance: 71 Ohm
Implantable Lead Implant Date: 20180105
Implantable Lead Implant Date: 20180105
Implantable Lead Location: 753858
Implantable Lead Location: 753860
Implantable Lead Model: 4598
Implantable Pulse Generator Implant Date: 20180105
Lead Channel Impedance Value: 208.568
Lead Channel Impedance Value: 220.723
Lead Channel Impedance Value: 234.706
Lead Channel Impedance Value: 247.358
Lead Channel Impedance Value: 264.643
Lead Channel Impedance Value: 399 Ohm
Lead Channel Impedance Value: 4047 Ohm
Lead Channel Impedance Value: 437 Ohm
Lead Channel Impedance Value: 494 Ohm
Lead Channel Impedance Value: 532 Ohm
Lead Channel Impedance Value: 570 Ohm
Lead Channel Impedance Value: 589 Ohm
Lead Channel Impedance Value: 703 Ohm
Lead Channel Impedance Value: 779 Ohm
Lead Channel Impedance Value: 817 Ohm
Lead Channel Impedance Value: 912 Ohm
Lead Channel Impedance Value: 950 Ohm
Lead Channel Impedance Value: 950 Ohm
Lead Channel Pacing Threshold Amplitude: 0.5 V
Lead Channel Pacing Threshold Amplitude: 0.875 V
Lead Channel Pacing Threshold Pulse Width: 0.4 ms
Lead Channel Pacing Threshold Pulse Width: 0.8 ms
Lead Channel Sensing Intrinsic Amplitude: 17.5 mV
Lead Channel Sensing Intrinsic Amplitude: 17.5 mV
Lead Channel Setting Pacing Amplitude: 1.5 V
Lead Channel Setting Pacing Amplitude: 2 V
Lead Channel Setting Pacing Pulse Width: 0.4 ms
Lead Channel Setting Pacing Pulse Width: 0.8 ms
Lead Channel Setting Sensing Sensitivity: 0.3 mV

## 2020-01-02 NOTE — Progress Notes (Signed)
Remote ICD transmission.   

## 2020-01-04 DIAGNOSIS — N3090 Cystitis, unspecified without hematuria: Secondary | ICD-10-CM | POA: Diagnosis not present

## 2020-01-15 ENCOUNTER — Telehealth: Payer: Self-pay

## 2020-01-15 NOTE — Telephone Encounter (Signed)
Routed to primary cardiologist Dr Haroldine Laws for review.  Patient disenrolled from [facility] 06/13/2019 due to unable to reach daughter or patient for participation.

## 2020-01-15 NOTE — Telephone Encounter (Signed)
[facility] alert received 01/14/20 for elevated Optivol, thoracic impedance below daily reference.   Routing to [facility] for review.

## 2020-01-15 NOTE — Telephone Encounter (Signed)
Jasmine - can you please f/u with her and see if she has been taking her lasix. We will need to increase diuretics for several days.

## 2020-01-16 NOTE — Telephone Encounter (Signed)
Called [phone] and left VM requesting pt return my call.

## 2020-01-17 NOTE — Telephone Encounter (Signed)
2nd attempt to reach pt by phone no answer/left vm requesting return call.

## 2020-02-04 ENCOUNTER — Other Ambulatory Visit (HOSPITAL_COMMUNITY): Payer: Self-pay | Admitting: Adult Health

## 2020-02-11 ENCOUNTER — Other Ambulatory Visit (HOSPITAL_COMMUNITY): Payer: Self-pay | Admitting: Adult Health

## 2020-02-11 ENCOUNTER — Other Ambulatory Visit (HOSPITAL_COMMUNITY): Payer: Self-pay | Admitting: Cardiology

## 2020-02-14 ENCOUNTER — Other Ambulatory Visit (HOSPITAL_COMMUNITY): Payer: Self-pay | Admitting: *Deleted

## 2020-02-14 ENCOUNTER — Telehealth (HOSPITAL_COMMUNITY): Payer: Self-pay | Admitting: Internal Medicine

## 2020-02-14 DIAGNOSIS — I5022 Chronic systolic (congestive) heart failure: Secondary | ICD-10-CM

## 2020-02-14 MED ORDER — METOLAZONE 2.5 MG PO TABS
ORAL_TABLET | ORAL | 3 refills | Status: DC
Start: 2020-02-14 — End: 2021-05-09

## 2020-02-14 MED ORDER — POTASSIUM CHLORIDE CRYS ER 20 MEQ PO TBCR
EXTENDED_RELEASE_TABLET | ORAL | 3 refills | Status: DC
Start: 2020-02-14 — End: 2020-05-19

## 2020-02-14 NOTE — Telephone Encounter (Signed)
Pt request Potassium Chloride SA, and Metolazone refill, please send script to [facility], pt scheduled 03/21/20.

## 2020-03-21 ENCOUNTER — Encounter (HOSPITAL_COMMUNITY): Payer: Medicare Other | Admitting: Internal Medicine

## 2020-03-21 ENCOUNTER — Ambulatory Visit (HOSPITAL_COMMUNITY): Payer: Medicare Other

## 2020-03-21 ENCOUNTER — Other Ambulatory Visit (HOSPITAL_COMMUNITY): Payer: Self-pay | Admitting: Internal Medicine

## 2020-03-29 ENCOUNTER — Other Ambulatory Visit (HOSPITAL_COMMUNITY): Payer: Self-pay | Admitting: Adult Health

## 2020-03-31 ENCOUNTER — Ambulatory Visit (INDEPENDENT_AMBULATORY_CARE_PROVIDER_SITE_OTHER): Payer: Medicare Other

## 2020-03-31 DIAGNOSIS — I5022 Chronic systolic (congestive) heart failure: Secondary | ICD-10-CM

## 2020-03-31 DIAGNOSIS — I428 Other cardiomyopathies: Secondary | ICD-10-CM

## 2020-03-31 LAB — CUP PACEART REMOTE DEVICE CHECK
Battery Remaining Longevity: 41 mo
Battery Voltage: 2.96 V
Brady Statistic AP VP Percent: 0 %
Brady Statistic AP VS Percent: 0 %
Brady Statistic AS VP Percent: 0 %
Brady Statistic AS VS Percent: 0 %
Brady Statistic RA Percent Paced: 0 %
Brady Statistic RV Percent Paced: 83.86 %
Date Time Interrogation Session: 20211220043624
HighPow Impedance: 91 Ohm
Implantable Lead Implant Date: 20180105
Implantable Lead Implant Date: 20180105
Implantable Lead Location: 753858
Implantable Lead Location: 753860
Implantable Lead Model: 4598
Implantable Pulse Generator Implant Date: 20180105
Lead Channel Impedance Value: 223.149
Lead Channel Impedance Value: 237.12 Ohm
Lead Channel Impedance Value: 257.518
Lead Channel Impedance Value: 264 Ohm
Lead Channel Impedance Value: 276.305
Lead Channel Impedance Value: 4047 Ohm
Lead Channel Impedance Value: 437 Ohm
Lead Channel Impedance Value: 456 Ohm
Lead Channel Impedance Value: 494 Ohm
Lead Channel Impedance Value: 570 Ohm
Lead Channel Impedance Value: 627 Ohm
Lead Channel Impedance Value: 627 Ohm
Lead Channel Impedance Value: 760 Ohm
Lead Channel Impedance Value: 817 Ohm
Lead Channel Impedance Value: 817 Ohm
Lead Channel Impedance Value: 988 Ohm
Lead Channel Impedance Value: 988 Ohm
Lead Channel Impedance Value: 988 Ohm
Lead Channel Pacing Threshold Amplitude: 0.5 V
Lead Channel Pacing Threshold Amplitude: 1 V
Lead Channel Pacing Threshold Pulse Width: 0.4 ms
Lead Channel Pacing Threshold Pulse Width: 0.8 ms
Lead Channel Sensing Intrinsic Amplitude: 30.625 mV
Lead Channel Sensing Intrinsic Amplitude: 30.625 mV
Lead Channel Setting Pacing Amplitude: 1.5 V
Lead Channel Setting Pacing Amplitude: 2 V
Lead Channel Setting Pacing Pulse Width: 0.4 ms
Lead Channel Setting Pacing Pulse Width: 0.8 ms
Lead Channel Setting Sensing Sensitivity: 0.3 mV

## 2020-04-14 NOTE — Progress Notes (Signed)
Remote ICD transmission.   

## 2020-05-08 ENCOUNTER — Ambulatory Visit (HOSPITAL_BASED_OUTPATIENT_CLINIC_OR_DEPARTMENT_OTHER)
Admission: RE | Admit: 2020-05-08 | Discharge: 2020-05-08 | Disposition: A | Discharge status: Home / Self Care | Payer: Medicare Other | Source: Ambulatory Visit | Attending: Internal Medicine | Admitting: Internal Medicine

## 2020-05-08 ENCOUNTER — Other Ambulatory Visit: Payer: Self-pay

## 2020-05-08 ENCOUNTER — Encounter (HOSPITAL_COMMUNITY): Payer: Self-pay | Admitting: Internal Medicine

## 2020-05-08 ENCOUNTER — Ambulatory Visit (HOSPITAL_COMMUNITY)
Admission: RE | Admit: 2020-05-08 | Discharge: 2020-05-08 | Disposition: A | Discharge status: Home / Self Care | Payer: Medicare Other | Source: Ambulatory Visit | Attending: Internal Medicine | Admitting: Internal Medicine

## 2020-05-08 VITALS — BP 110/64 | HR 107 | Wt 225.8 lb

## 2020-05-08 DIAGNOSIS — E1122 Type 2 diabetes mellitus with diabetic chronic kidney disease: Secondary | ICD-10-CM | POA: Insufficient documentation

## 2020-05-08 DIAGNOSIS — I4891 Unspecified atrial fibrillation: Secondary | ICD-10-CM | POA: Diagnosis not present

## 2020-05-08 DIAGNOSIS — Z8249 Family history of ischemic heart disease and other diseases of the circulatory system: Secondary | ICD-10-CM | POA: Diagnosis not present

## 2020-05-08 DIAGNOSIS — I2510 Atherosclerotic heart disease of native coronary artery without angina pectoris: Secondary | ICD-10-CM | POA: Insufficient documentation

## 2020-05-08 DIAGNOSIS — I5022 Chronic systolic (congestive) heart failure: Secondary | ICD-10-CM | POA: Diagnosis not present

## 2020-05-08 DIAGNOSIS — M549 Dorsalgia, unspecified: Secondary | ICD-10-CM | POA: Diagnosis not present

## 2020-05-08 DIAGNOSIS — I252 Old myocardial infarction: Secondary | ICD-10-CM | POA: Insufficient documentation

## 2020-05-08 DIAGNOSIS — I700 Atherosclerosis of aorta: Secondary | ICD-10-CM | POA: Insufficient documentation

## 2020-05-08 DIAGNOSIS — I447 Left bundle-branch block, unspecified: Secondary | ICD-10-CM | POA: Diagnosis not present

## 2020-05-08 DIAGNOSIS — I130 Hypertensive heart and chronic kidney disease with heart failure and stage 1 through stage 4 chronic kidney disease, or unspecified chronic kidney disease: Secondary | ICD-10-CM | POA: Diagnosis not present

## 2020-05-08 DIAGNOSIS — Z79899 Other long term (current) drug therapy: Secondary | ICD-10-CM | POA: Insufficient documentation

## 2020-05-08 DIAGNOSIS — Z794 Long term (current) use of insulin: Secondary | ICD-10-CM | POA: Diagnosis not present

## 2020-05-08 DIAGNOSIS — I428 Other cardiomyopathies: Secondary | ICD-10-CM | POA: Diagnosis not present

## 2020-05-08 DIAGNOSIS — N183 Chronic kidney disease, stage 3 unspecified: Secondary | ICD-10-CM | POA: Insufficient documentation

## 2020-05-08 DIAGNOSIS — Z7901 Long term (current) use of anticoagulants: Secondary | ICD-10-CM | POA: Diagnosis not present

## 2020-05-08 DIAGNOSIS — Z95810 Presence of automatic (implantable) cardiac defibrillator: Secondary | ICD-10-CM | POA: Diagnosis not present

## 2020-05-08 LAB — BASIC METABOLIC PANEL
Anion gap: 14 (ref 5–15)
BUN: 49 mg/dL — ABNORMAL HIGH (ref 8–23)
CO2: 36 mmol/L — ABNORMAL HIGH (ref 22–32)
Calcium: 8.8 mg/dL — ABNORMAL LOW (ref 8.9–10.3)
Chloride: 87 mmol/L — ABNORMAL LOW (ref 98–111)
Creatinine, Ser: 2.47 mg/dL — ABNORMAL HIGH (ref 0.44–1.00)
GFR, Estimated: 19 mL/min — ABNORMAL LOW (ref >60)
Glucose, Bld: 322 mg/dL — ABNORMAL HIGH (ref 70–99)
Potassium: 3.2 mmol/L — ABNORMAL LOW (ref 3.5–5.1)
Sodium: 137 mmol/L (ref 135–145)

## 2020-05-08 LAB — ECHOCARDIOGRAM COMPLETE: S' Lateral: 3.7 cm

## 2020-05-08 MED ORDER — CARVEDILOL 3.125 MG PO TABS: 3.1250 mg | ORAL_TABLET | Freq: Two times a day (BID) | ORAL | 10 refills | Status: DC

## 2020-05-08 NOTE — Patient Instructions (Signed)
Restart Coreg at 3.125 mg ( 1 tablet) Twice daily  Labs done today, your results will be available in MyChart, we will contact you for abnormal readings.  Please call our office in September 2022 to schedule an appointment  If you have any questions or concerns before your next appointment please send [place] a message through [employer] or call our office at [phone].    TO LEAVE A MESSAGE FOR THE NURSE SELECT OPTION 2, PLEASE LEAVE A MESSAGE INCLUDING: . YOUR NAME . DATE OF BIRTH . CALL BACK NUMBER . REASON FOR CALL**this is important as we prioritize the call backs  [place] AS LONG AS YOU CALL BEFORE 4:00 PM  At the [facility], you and your health needs are our priority. As part of our continuing mission to provide you with exceptional heart care, we have created designated Provider Care Teams. These Care Teams include your primary Cardiologist (physician) and Advanced Practice Providers (APPs- Physician Assistants and Nurse Practitioners) who all work together to provide you with the care you need, when you need it.   You may see any of the following providers on your designated Care Team at your next follow up: Marland Kitchen Dr Glori Bickers . Dr Loralie Champagne . Darrick Grinder, NP . Lyda Jester, [place] . Audry Riles, PharmD   Please be sure to bring in all your medications bottles to every appointment.

## 2020-05-08 NOTE — Progress Notes (Signed)
  Echocardiogram 2D Echocardiogram has been performed.  Victoria Adams 05/08/2020, 2:57 PM

## 2020-05-08 NOTE — Progress Notes (Signed)
Patient ID: Victoria Adams, female   DOB: May 05, 1940, 80 y.o.   MRN: [id number]     Advanced Heart Failure Clinic Note   PCP: Dr. Gar Ponto ([facility] in [place]) Primary Cardiologist: Dr. Domenic Polite EP: Dr Caryl Comes  HF: Dr. Haroldine Laws   HPI: Victoria Adams is a 80 y.o. female with a history of morbid obesity, HTN, chronic A-Fib, poorly-controlled DM2, CAD, chronic back pain, chronic systolic HF due to NICM s/p CTR-D 2018.   Admitted 02/07/19 after fall resulting in unstable compression fracture. Underwent T10-T12 posterior lateral fusion. Hospitalization prolonged. Discharged on 02/21/19.  Since discharge she has struggled with increased leg edema and volume overload. Followed closely in the device clinic with frequent adjustments to her diuretics.  She was switched to torsemide but still with volume overload.   Today she returns for HF follow up with her daughter.  Last visit almost exactly one year ago her volume status had improved.  Metolazone decreased to once weekly on Friday and she was continued on torsemide 80mg  in am and 40mg  in pm.  Around 5 months later patient was admitted for symptoms of severe headache and vomiting found to have subacute CVA of left PCA with associated hemorrhage and sulcal effacement with small amount of left to right midline shift.  Feels so so. Weight is stable at 218-220lbs on torsemide 80mg  q am and 40mg  q pm, also still taking metolazone every Friday.  No shortness of breath at rest.  Not getting much exercise very sedentary seems to be most limited by her back pain.  Does minimal ADL's, sleeping a lot.     Optivol:Impedance at baseline. Fluid index down. (has been up and down) BIV pacing down to 85%  Activity level < 1hr Personally reviewed   ECHO (08/2013) EF 20-25%, RV normal            (02/2014) EF 25-30%, RV normal           (09/2014) EF 20-25% RV mildly dilated.            (02/2016) EF 20-25% RV normal            (06/2016)  Echo 06/15/16 EF 60-65%  SH:  Lives in [place] with son and daughter, no ETOH and does not smoke, retired  [place]: Mother deceased: CAD?, HTN, lung cancer, afib        Father deceased: DM2, lung cancer, HTN, CAD stents        3 daughters (1 has afib)        1 son: healthy and alive  Review of systems complete and found to be negative unless listed in HPI.    Past Medical History:  Diagnosis Date  . Allergic rhinitis   . 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 Atrial fibrillation ([place])   . Chronic low back pain    Secondary to DJD  . Chronic systolic heart failure (HCC)    a. echo (5/15):  mod LVH, EF 20-25%, diff HK, severe LAE, mild RAE  . Coronary atherosclerosis-non obstructive    LHC (5/15):  EF 40-45% global HK; long LAD 40-60%, ostial 1st major septal perf 80-90%, ostial CFX ?%, mid AVCFX extensive Ca2+, dist PDA 50%  . Depression   . Essential hypertension, benign   . Fatty liver disease, nonalcoholic   . GERD (gastroesophageal reflux disease)   . Iron deficiency anemia   . Mitral regurgitation   . NICM (nonischemic cardiomyopathy) ([place])    a.  echo (5/15):  mod LVH, EF 20-25%, diff HK, severe LAE, mild RAE  . NSTEMI (non-ST elevated myocardial infarction) ([place]) may 2015   No CAD  . Obesity   . Osteopenia   . PONV (postoperative nausea and vomiting)   . Rosacea   . Type 2 diabetes mellitus ([place])   . Vitamin D deficiency     Current Outpatient Medications  Medication Sig Dispense Refill  . atorvastatin (LIPITOR) 40 MG tablet Take by mouth.    . Calcium Carb-Cholecalciferol (CALCIUM 600 + D PO) Take 1 tablet by mouth 2 (two) times daily.     . colchicine 0.6 MG tablet Take 1 tablet (0.6 mg total) by mouth daily. 30 tablet 0  . doxycycline (VIBRAMYCIN) 100 MG capsule Take 100-200 mg by mouth 2 (two) times daily as needed (rosacea).     Marland Kitchen ELIQUIS 5 MG TABS tablet TAKE (1) TABLET TWICE DAILY. 60 tablet 3  . ferrous sulfate 325 (65 FE) MG tablet TAKE 1 TABLET ONCE DAILY WITH BREAKFAST. 30 tablet 0  . gabapentin  (NEURONTIN) 300 MG capsule Take 1 capsule (300 mg total) by mouth 3 (three) times daily. 90 capsule 0  . HYDROcodone-acetaminophen (NORCO) 10-325 MG tablet Take 1 tablet by mouth every 6 (six) hours as needed for moderate pain (To be taken 3 hours after Dilaudid, if Dilaudid is ineffective).     . insulin glargine (LANTUS) 100 UNIT/ML injection Inject 0.6-1 mLs (60-100 Units total) into the skin 2 (two) times daily. 100 units in the morning and 60 units in the evening 10 mL 11  . LORazepam (ATIVAN) 0.5 MG tablet Take 0.5 mg by mouth 3 (three) times daily as needed.    . metolazone (ZAROXOLYN) 2.5 MG tablet TAKE 1 TABLET EVERY TUESDAY AND FRIDAY ALONG WITH THE 40 MEQ OF POTASSIUM. 6 tablet 3  . omeprazole (PRILOSEC) 20 MG capsule Take 20 mg by mouth 2 (two) times daily before a meal.    . ondansetron (ZOFRAN ODT) 4 MG disintegrating tablet Take 1 tablet (4 mg total) by mouth every 8 (eight) hours as needed for nausea or vomiting. 20 tablet 0  . polyethylene glycol (MIRALAX / GLYCOLAX) packet Take 17 g by mouth daily.    . potassium chloride SA (KLOR-CON) 20 MEQ tablet TAKE 2 TABLETS TWICE DAILY. TAKE ADDITIONAL 2 TABLETS WITH EACH WEEKLY METOLAZONE DOSE ON TUESDAY AND FRIDAY. 150 tablet 3  . QC NATURAL VEGETABLE LAXATIVE 8.6 MG tablet Take by mouth.    . rosuvastatin (CRESTOR) 20 MG tablet TAKE 1 TABLET BY MOUTH ONCE DAILY. 30 tablet 0  . senna-docusate (SENOKOT-S) 8.6-50 MG tablet Take 2 tablets by mouth 2 (two) times daily. 60 tablet 0  . sertraline (ZOLOFT) 100 MG tablet Take 100 mg by mouth daily.    Marland Kitchen torsemide (DEMADEX) 20 MG tablet TAKE 4 TABLETS EVERY MORNING AND 2 TABLETS EVERY EVENING. 540 tablet 0   No current facility-administered medications for this encounter.   Vitals:   05/08/20 1512  BP: 110/64  Pulse: (!) 107  SpO2: 95%  Weight: 102.4 kg (225 lb 12.8 oz)   Wt Readings from Last 3 Encounters:  05/08/20 102.4 kg (225 lb 12.8 oz)  05/07/19 112.3 kg (247 lb 9.6 oz)  04/23/19  116.1 kg (256 lb)   PHYSICAL EXAM: Cardiac: JVD flat, normal rate and rhythm, clear s1 and s2, no murmurs, rubs or gallops, 1+ LE edema bilaterally Pulmonary: CTAB, not in distress Abdominal: non distended abdomen, soft and nontender Psych:  Alert, conversant, in good spirits    ASSESSMENT & PLAN: 1) Acute/Chronic systolic HF with recovered EF: NICM, EF 25-30%, RV nl (02/2014).  ECHO 09/2014 EF 20-25%. ECHO 01/2016 EF 20-25%.  - s/p CRT-D 1/18 -> Echo 3/18 EF 60-65% EF recovered with CRT-D - ECHO 01/2019 EF 60-65%.  - NYHA III.Volume status looks good weight stable at 218-220lbs. Optivol - fluid index at baseline. Impedance back up.  continue torsemide to 80 mg in am and 40 mg pm. continue metolazone every Friday.   - carvedilol d/ced after stroke restart low dose today   2) CAD: Moderate diffuse mid LAD disease of 40-60%. Septal perforator branch with ostial ~90%.  No chest pain.   - Continue statin and bb.   3) CKD stage III: - BMET today  4) Chronic Afib:  - Rate controlled.  - Continue eliquis 5 mg BID.   5) LBBB-  - s/p CRT-D 1/18 - BIV pacing down to 85% discussed with EP they will look into this 6) HTN Stable.  7) Back pain Followed PT.  Follow up in 3 months. BMET.   Katherine Roan, MD  05/08/2020    Patient seen and examined with the above-signed Advanced Practice Provider and/or Housestaff. I personally reviewed laboratory data, imaging studies and relevant notes. I independently examined the patient and formulated the important aspects of the plan. I have edited the note to reflect any of my changes or salient points. I have personally discussed the plan with the patient and/or family.  She is here today with her daughter. Has had a stroke in last few months and seems to have some residual memory deficits. Denies CP or SOB. Not very active. EF normal on last echo. Volume status ok on exam and Optivol.   General: Sitting in chair No resp difficulty HEENT:  normal Neck: supple. no JVD. Carotids 2+ bilat; no bruits. No lymphadenopathy or thryomegaly appreciated. Cor: PMI nondisplaced. Regular rate & rhythm. No rubs, gallops or murmurs. Lungs: clear Abdomen: obese soft, nontender, nondistended. No hepatosplenomegaly. No bruits or masses. Good bowel sounds. Extremities: no cyanosis, clubbing, rash, edema Neuro: alert communicative, cranial nerves grossly intact. moves all 4 extremities w/o difficulty. Affect pleasant  She remains stable from HF perspective. EF has recovered with BiV pacing. Functional status much worse after CVA. Volume status ok today but has been up and down. ICD interrogated and shows drop in Biv pacing. We have asked Ep to look at this for [place]. Will see back in 9 months. Can likely be followed by General Cardiology after that.   Glori Bickers, MD  5:17 PM

## 2020-05-18 ENCOUNTER — Other Ambulatory Visit (HOSPITAL_COMMUNITY): Payer: Self-pay | Admitting: Cardiology

## 2020-05-18 ENCOUNTER — Other Ambulatory Visit (HOSPITAL_COMMUNITY): Payer: Self-pay | Admitting: Internal Medicine

## 2020-05-21 ENCOUNTER — Encounter: Payer: Self-pay | Admitting: Student

## 2020-05-23 ENCOUNTER — Encounter (HOSPITAL_COMMUNITY): Payer: Self-pay

## 2020-06-15 NOTE — Progress Notes (Deleted)
Cardiology Office Note Date:  06/15/2020  Patient ID:  Victoria Adams, Victoria Adams 05/29/1940, MRN [id number] PCP:  Caryl Bis, MD  Cardiologist:  Dr. Haroldine Laws Electrophysiologist: Dr. Caryl Comes  ***refresh   Chief Complaint: *** device visit  History of Present Illness: Victoria Adams is a 80 y.o. female with history of morbid obesity, HTN, DM, CAD (mid LAD disease of 40-60%. Septal perforator branch with ostial ~90%), NICM, Chronic CHF (systolic), CRT-D,  Afib, chronic back pain, stroke, CKD (III), LBBB.  She comes today to be sen for Dr. Caryl Comes, last seen by him 2018  Following regularly with AHF team  June 2021 suffered left PCA stroke. Treated at [facility], did not receive IV tPA due to presented outside the time window for IV tPA repeat CT of the head on 09/17/2019 showed no significant change. A further repeat for headache on 09/20/2019 showed a large left PCA territory infarct with mild diffuse petechial hemorrhage no well-defined focal parenchymal hematoma, may be some subarachnoid or subdural hematoma on the left tentorial leaflet, no intraventricular extension, minimal uncal herniation on the left and minimal focal mass-effect on the left lateral ventricle.  Echocardiogram with Doppler complete with bubble study showed no cardiac etiology for the stroke. Left ventricular ejection fraction was 50-55 %. The right atrium was found to be mildly dilated. Injection of contrast document no interatrial shunt. Eliquis to restart 09/30/19   Most recently she saw Dr. Haroldine Laws.  She had unfortunately suffered stroke recently, was doing ok. Her coreg resumed low dose, volume at her baseline. Noted her BP% only 85% and planned to see EP to look into this.  *** missed tele health with JA?? *** + remotes *** c/w AHF team *** bleeding, eliquis, dose *** BP % down, threshold? Capture? *** effective?? *** neurologist? Eliquis?  Missed doses with stroke??    Device  information MDT CRT-D implanted  A Port plugged   Past Medical History:  Diagnosis Date  . Allergic rhinitis   . 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 Atrial fibrillation ([place])   . Chronic low back pain    Secondary to DJD  . Chronic systolic heart failure (HCC)    a. echo (5/15):  mod LVH, EF 20-25%, diff HK, severe LAE, mild RAE  . Coronary atherosclerosis-non obstructive    LHC (5/15):  EF 40-45% global HK; long LAD 40-60%, ostial 1st major septal perf 80-90%, ostial CFX ?%, mid AVCFX extensive Ca2+, dist PDA 50%  . Depression   . Essential hypertension, benign   . Fatty liver disease, nonalcoholic   . GERD (gastroesophageal reflux disease)   . Iron deficiency anemia   . Mitral regurgitation   . NICM (nonischemic cardiomyopathy) ([place])    a. echo (5/15):  mod LVH, EF 20-25%, diff HK, severe LAE, mild RAE  . NSTEMI (non-ST elevated myocardial infarction) ([place]) may 2015   No CAD  . Obesity   . Osteopenia   . PONV (postoperative nausea and vomiting)   . Rosacea   . Type 2 diabetes mellitus ([place])   . Vitamin D deficiency     Past Surgical History:  Procedure Laterality Date  . ABDOMINAL HYSTERECTOMY    . APPENDECTOMY    . BREAST BIOPSY     RIGHT  . CARDIAC CATHETERIZATION  2010   LAD 50%, CFX 30%, RCA 40%; EF by echo 25-35%  . CATARACT EXTRACTION Bilateral   . CHOLECYSTECTOMY    . COLONOSCOPY WITH PROPOFOL  N/A 12/20/2013   Procedure: COLONOSCOPY WITH PROPOFOL;  Surgeon: Milus Banister, MD;  Location: [facility] ENDOSCOPY;  Service: Endoscopy;  Laterality: N/A;  . EP IMPLANTABLE DEVICE N/A 04/16/2016   Procedure: BiV ICD Insertion CRT-D;  Surgeon: Deboraha Sprang, MD;  Location: [place] CV LAB;  Service: Cardiovascular;  Laterality: N/A;  . ESOPHAGOGASTRODUODENOSCOPY N/A 08/25/2013   Procedure: ESOPHAGOGASTRODUODENOSCOPY (EGD);  Surgeon: Milus Banister, MD;  Location: [place];  Service: Endoscopy;  Laterality: N/A;  . LEFT HEART CATHETERIZATION WITH CORONARY ANGIOGRAM N/A  08/24/2013   Procedure: LEFT HEART CATHETERIZATION WITH CORONARY ANGIOGRAM;  Surgeon: Leonie Man, MD;  Location: [facility] CATH LAB;  Service: Cardiovascular;  Laterality: N/A;  . LUMBAR PERCUTANEOUS PEDICLE SCREW 2 LEVEL N/A 02/15/2019   Procedure: Percutaneous Pedicle Screw Fixation from Thoracic Ten-Thoracic Twelve with Methylmethacrylate Screw Augmentation;  Surgeon: Earnie Larsson, MD;  Location: [place];  Service: Neurosurgery;  Laterality: N/A;  . ROTATOR CUFF REPAIR     RIGHT SHOULDER  . TONSILLECTOMY  yrs ago  . TOTAL ABDOMINAL HYSTERECTOMY W/ BILATERAL SALPINGOOPHORECTOMY     for dermoid tumor    Current Outpatient Medications  Medication Sig Dispense Refill  . atorvastatin (LIPITOR) 40 MG tablet Take by mouth.    . Calcium Carb-Cholecalciferol (CALCIUM 600 + D PO) Take 1 tablet by mouth 2 (two) times daily.     . carvedilol (COREG) 3.125 MG tablet Take 1 tablet (3.125 mg total) by mouth 2 (two) times daily with a meal. 60 tablet 10  . colchicine 0.6 MG tablet Take 1 tablet (0.6 mg total) by mouth daily. 30 tablet 0  . doxycycline (VIBRAMYCIN) 100 MG capsule Take 100-200 mg by mouth 2 (two) times daily as needed (rosacea).     Marland Kitchen ELIQUIS 5 MG TABS tablet TAKE (1) TABLET TWICE DAILY. 60 tablet 3  . ferrous sulfate 325 (65 FE) MG tablet TAKE 1 TABLET ONCE DAILY WITH BREAKFAST. 30 tablet 0  . gabapentin (NEURONTIN) 300 MG capsule Take 1 capsule (300 mg total) by mouth 3 (three) times daily. 90 capsule 0  . HYDROcodone-acetaminophen (NORCO) 10-325 MG tablet Take 1 tablet by mouth every 6 (six) hours as needed for moderate pain (To be taken 3 hours after Dilaudid, if Dilaudid is ineffective).     . insulin glargine (LANTUS) 100 UNIT/ML injection Inject 0.6-1 mLs (60-100 Units total) into the skin 2 (two) times daily. 100 units in the morning and 60 units in the evening 10 mL 11  . LORazepam (ATIVAN) 0.5 MG tablet Take 0.5 mg by mouth 3 (three) times daily as needed.    . metolazone (ZAROXOLYN) 2.5  MG tablet TAKE 1 TABLET EVERY TUESDAY AND FRIDAY ALONG WITH THE 40 MEQ OF POTASSIUM. 6 tablet 3  . omeprazole (PRILOSEC) 20 MG capsule Take 20 mg by mouth 2 (two) times daily before a meal.    . ondansetron (ZOFRAN ODT) 4 MG disintegrating tablet Take 1 tablet (4 mg total) by mouth every 8 (eight) hours as needed for nausea or vomiting. 20 tablet 0  . polyethylene glycol (MIRALAX / GLYCOLAX) packet Take 17 g by mouth daily.    . potassium chloride SA (KLOR-CON) 20 MEQ tablet TAKE 2 TABLETS TWICE DAILY. TAKE ADDITIONAL 2 TABLETS WITH EACH WEEKLY METOLAZONE DOSE ON TUESDAY AND FRIDAY. 150 tablet 0  . QC NATURAL VEGETABLE LAXATIVE 8.6 MG tablet Take by mouth.    . rosuvastatin (CRESTOR) 20 MG tablet TAKE 1 TABLET BY MOUTH ONCE DAILY. 30 tablet 0  .  senna-docusate (SENOKOT-S) 8.6-50 MG tablet Take 2 tablets by mouth 2 (two) times daily. 60 tablet 0  . sertraline (ZOLOFT) 100 MG tablet Take 100 mg by mouth daily.    Marland Kitchen torsemide (DEMADEX) 20 MG tablet TAKE 4 TABLETS EVERY MORNING AND 2 TABLETS EVERY EVENING. 540 tablet 0   No current facility-administered medications for this visit.    Allergies:   Codeine and Tylox [oxycodone-acetaminophen]   Social History:  The patient  reports that she has never smoked. She has never used smokeless tobacco. She reports that she does not drink alcohol and does not use drugs.   Family History:  The patient's family history includes Asthma in her brother; Coronary artery disease in her father; Diabetes in her father; Lung cancer in her father and mother; Other in her grandchild.  ROS:  Please see the history of present illness.    All other systems are reviewed and otherwise negative.   PHYSICAL EXAM:  VS:  There were no vitals taken for this visit. BMI: There is no height or weight on file to calculate BMI. Well nourished, well developed, in no acute distress HEENT: normocephalic, atraumatic Neck: no JVD, carotid bruits or masses Cardiac:  *** RRR; no  significant murmurs, no rubs, or gallops Lungs:  *** CTA b/l, no wheezing, rhonchi or rales Abd: soft, nontender MS: no deformity or *** atrophy Ext: *** no edema Skin: warm and dry, no rash Neuro:  No gross deficits appreciated Psych: euthymic mood, full affect  *** ICD site is stable, no tethering or discomfort   EKG:  Not done today   Device interrogation done today and reviewed by myself:  ***   05/08/20: TTE IMPRESSIONS  1. Left ventricular ejection fraction, by estimation, is 55 to 60%. The  left ventricle has normal function. The left ventricle has no regional  wall motion abnormalities. The left ventricular internal cavity size was  mildly dilated. Left ventricular  diastolic parameters are indeterminate.  2. Right ventricular systolic function is normal. The right ventricular  size is normal. There is mildly elevated pulmonary artery systolic  pressure.  3. Left atrial size was mildly dilated.  4. The mitral valve is normal in structure. Trivial mitral valve  regurgitation. No evidence of mitral stenosis.  5. The aortic valve is tricuspid. Aortic valve regurgitation is not  visualized. Mild to moderate aortic valve sclerosis/calcification is  present, without any evidence of aortic stenosis.  6. The inferior vena cava is normal in size with greater than 50%  respiratory variability, suggesting right atrial pressure of 3 mmHg.    Recent Labs: 05/08/2020: BUN 49; Creatinine, Ser 2.47; Potassium 3.2; Sodium 137  No results found for requested labs within last 8760 hours.   CrCl cannot be calculated (Patient's most recent lab result is older than the maximum 21 days allowed.).   Wt Readings from Last 3 Encounters:  05/08/20 225 lb 12.8 oz (102.4 kg)  05/07/19 247 lb 9.6 oz (112.3 kg)  04/23/19 256 lb (116.1 kg)     Other studies reviewed: Additional studies/records reviewed today include: summarized above  ASSESSMENT AND PLAN:  1. CRT-D     ***  2.  NICM 3. Chronic CHF     Recovered LVEF     ***  4. Permanent AFib     CHA2DS2Vasc is 9, on *** Eliquis, appropriately dosed     S/p stroke June     ***    Disposition: F/u with ***  Current medicines are reviewed  at length with the patient today.  The patient did not have any concerns regarding medicines.  Venetia Night, PA-C 06/15/2020 2:03 PM     [address] [place] [address] [phone] (office)  [phone] (fax)

## 2020-06-17 ENCOUNTER — Encounter: Payer: Medicare Other | Admitting: Physician Assistant

## 2020-06-24 ENCOUNTER — Other Ambulatory Visit (HOSPITAL_COMMUNITY): Payer: Self-pay | Admitting: Internal Medicine

## 2020-06-30 ENCOUNTER — Ambulatory Visit (INDEPENDENT_AMBULATORY_CARE_PROVIDER_SITE_OTHER): Payer: Medicare Other

## 2020-06-30 DIAGNOSIS — I428 Other cardiomyopathies: Secondary | ICD-10-CM

## 2020-07-01 LAB — CUP PACEART REMOTE DEVICE CHECK
Battery Remaining Longevity: 36 mo
Battery Voltage: 2.96 V
Brady Statistic AP VP Percent: 0 %
Brady Statistic AP VS Percent: 0 %
Brady Statistic AS VP Percent: 0 %
Brady Statistic AS VS Percent: 0 %
Brady Statistic RA Percent Paced: 0 %
Brady Statistic RV Percent Paced: 84.12 %
Date Time Interrogation Session: 20220321012206
HighPow Impedance: 77 Ohm
Implantable Lead Implant Date: 20180105
Implantable Lead Implant Date: 20180105
Implantable Lead Location: 753858
Implantable Lead Location: 753860
Implantable Lead Model: 4598
Implantable Pulse Generator Implant Date: 20180105
Lead Channel Impedance Value: 1045 Ohm
Lead Channel Impedance Value: 237.12 Ohm
Lead Channel Impedance Value: 241.412
Lead Channel Impedance Value: 267.31 Ohm
Lead Channel Impedance Value: 279.933
Lead Channel Impedance Value: 285.934
Lead Channel Impedance Value: 4047 Ohm
Lead Channel Impedance Value: 456 Ohm
Lead Channel Impedance Value: 494 Ohm
Lead Channel Impedance Value: 513 Ohm
Lead Channel Impedance Value: 646 Ohm
Lead Channel Impedance Value: 646 Ohm
Lead Channel Impedance Value: 665 Ohm
Lead Channel Impedance Value: 760 Ohm
Lead Channel Impedance Value: 836 Ohm
Lead Channel Impedance Value: 836 Ohm
Lead Channel Impedance Value: 988 Ohm
Lead Channel Impedance Value: 988 Ohm
Lead Channel Pacing Threshold Amplitude: 0.875 V
Lead Channel Pacing Threshold Amplitude: 1.125 V
Lead Channel Pacing Threshold Pulse Width: 0.4 ms
Lead Channel Pacing Threshold Pulse Width: 0.8 ms
Lead Channel Sensing Intrinsic Amplitude: 15 mV
Lead Channel Sensing Intrinsic Amplitude: 15 mV
Lead Channel Setting Pacing Amplitude: 1.75 V
Lead Channel Setting Pacing Amplitude: 2 V
Lead Channel Setting Pacing Pulse Width: 0.4 ms
Lead Channel Setting Pacing Pulse Width: 0.8 ms
Lead Channel Setting Sensing Sensitivity: 0.3 mV

## 2020-07-07 NOTE — Progress Notes (Signed)
Remote ICD transmission.   

## 2020-07-14 DIAGNOSIS — I2510 Atherosclerotic heart disease of native coronary artery without angina pectoris: Secondary | ICD-10-CM | POA: Diagnosis not present

## 2020-07-14 DIAGNOSIS — I4891 Unspecified atrial fibrillation: Secondary | ICD-10-CM | POA: Diagnosis not present

## 2020-07-14 DIAGNOSIS — E1122 Type 2 diabetes mellitus with diabetic chronic kidney disease: Secondary | ICD-10-CM | POA: Diagnosis not present

## 2020-07-14 DIAGNOSIS — E876 Hypokalemia: Secondary | ICD-10-CM | POA: Diagnosis not present

## 2020-07-14 DIAGNOSIS — W5503XA Scratched by cat, initial encounter: Secondary | ICD-10-CM | POA: Diagnosis not present

## 2020-07-14 DIAGNOSIS — G8929 Other chronic pain: Secondary | ICD-10-CM | POA: Diagnosis present

## 2020-07-14 DIAGNOSIS — E11621 Type 2 diabetes mellitus with foot ulcer: Secondary | ICD-10-CM | POA: Diagnosis not present

## 2020-07-14 DIAGNOSIS — R7989 Other specified abnormal findings of blood chemistry: Secondary | ICD-10-CM | POA: Diagnosis not present

## 2020-07-14 DIAGNOSIS — L97519 Non-pressure chronic ulcer of other part of right foot with unspecified severity: Secondary | ICD-10-CM | POA: Diagnosis not present

## 2020-07-14 DIAGNOSIS — R600 Localized edema: Secondary | ICD-10-CM | POA: Diagnosis not present

## 2020-07-14 DIAGNOSIS — E11628 Type 2 diabetes mellitus with other skin complications: Secondary | ICD-10-CM | POA: Diagnosis not present

## 2020-07-14 DIAGNOSIS — L03115 Cellulitis of right lower limb: Secondary | ICD-10-CM | POA: Diagnosis not present

## 2020-07-14 DIAGNOSIS — N183 Chronic kidney disease, stage 3 unspecified: Secondary | ICD-10-CM | POA: Diagnosis not present

## 2020-07-14 DIAGNOSIS — J811 Chronic pulmonary edema: Secondary | ICD-10-CM | POA: Diagnosis not present

## 2020-07-14 DIAGNOSIS — I517 Cardiomegaly: Secondary | ICD-10-CM | POA: Diagnosis not present

## 2020-07-14 DIAGNOSIS — E782 Mixed hyperlipidemia: Secondary | ICD-10-CM | POA: Diagnosis present

## 2020-07-14 DIAGNOSIS — M545 Low back pain, unspecified: Secondary | ICD-10-CM | POA: Diagnosis not present

## 2020-07-14 DIAGNOSIS — E1142 Type 2 diabetes mellitus with diabetic polyneuropathy: Secondary | ICD-10-CM | POA: Diagnosis not present

## 2020-07-14 DIAGNOSIS — S91351A Open bite, right foot, initial encounter: Secondary | ICD-10-CM | POA: Diagnosis present

## 2020-07-14 DIAGNOSIS — S80811A Abrasion, right lower leg, initial encounter: Secondary | ICD-10-CM | POA: Diagnosis not present

## 2020-07-14 DIAGNOSIS — Z20822 Contact with and (suspected) exposure to covid-19: Secondary | ICD-10-CM | POA: Diagnosis not present

## 2020-07-14 DIAGNOSIS — I509 Heart failure, unspecified: Secondary | ICD-10-CM | POA: Diagnosis not present

## 2020-07-14 DIAGNOSIS — I890 Lymphedema, not elsewhere classified: Secondary | ICD-10-CM | POA: Diagnosis not present

## 2020-07-14 DIAGNOSIS — R0602 Shortness of breath: Secondary | ICD-10-CM | POA: Diagnosis not present

## 2020-07-14 DIAGNOSIS — Z6837 Body mass index (BMI) 37.0-37.9, adult: Secondary | ICD-10-CM | POA: Diagnosis not present

## 2020-07-14 DIAGNOSIS — N179 Acute kidney failure, unspecified: Secondary | ICD-10-CM | POA: Diagnosis not present

## 2020-07-14 DIAGNOSIS — Z794 Long term (current) use of insulin: Secondary | ICD-10-CM | POA: Diagnosis not present

## 2020-07-14 DIAGNOSIS — I081 Rheumatic disorders of both mitral and tricuspid valves: Secondary | ICD-10-CM | POA: Diagnosis not present

## 2020-07-14 DIAGNOSIS — I272 Pulmonary hypertension, unspecified: Secondary | ICD-10-CM | POA: Diagnosis not present

## 2020-07-14 DIAGNOSIS — F331 Major depressive disorder, recurrent, moderate: Secondary | ICD-10-CM | POA: Diagnosis not present

## 2020-07-14 DIAGNOSIS — I5023 Acute on chronic systolic (congestive) heart failure: Secondary | ICD-10-CM | POA: Diagnosis not present

## 2020-07-14 DIAGNOSIS — I5021 Acute systolic (congestive) heart failure: Secondary | ICD-10-CM | POA: Diagnosis not present

## 2020-07-14 DIAGNOSIS — I130 Hypertensive heart and chronic kidney disease with heart failure and stage 1 through stage 4 chronic kidney disease, or unspecified chronic kidney disease: Secondary | ICD-10-CM | POA: Diagnosis not present

## 2020-07-14 DIAGNOSIS — M7989 Other specified soft tissue disorders: Secondary | ICD-10-CM | POA: Diagnosis not present

## 2020-07-14 DIAGNOSIS — Z885 Allergy status to narcotic agent status: Secondary | ICD-10-CM | POA: Diagnosis not present

## 2020-07-22 ENCOUNTER — Telehealth: Payer: Self-pay

## 2020-07-22 DIAGNOSIS — I482 Chronic atrial fibrillation, unspecified: Secondary | ICD-10-CM | POA: Diagnosis not present

## 2020-07-22 DIAGNOSIS — E876 Hypokalemia: Secondary | ICD-10-CM | POA: Diagnosis not present

## 2020-07-22 DIAGNOSIS — I5032 Chronic diastolic (congestive) heart failure: Secondary | ICD-10-CM | POA: Diagnosis not present

## 2020-07-22 DIAGNOSIS — I700 Atherosclerosis of aorta: Secondary | ICD-10-CM | POA: Diagnosis not present

## 2020-07-22 NOTE — Telephone Encounter (Signed)
-----   Message from Deboraha Sprang, MD sent at 07/20/2020  2:20 PM EDT ----- Remote reviewed. This remote is abnormal for progressive loss of CRT  can we arrange an office visti with me or APP to assess

## 2020-07-22 NOTE — Telephone Encounter (Signed)
Attempted phone call to pt and left voicemail message for pt to contact scheduler at [phone].

## 2020-07-23 ENCOUNTER — Telehealth: Payer: Self-pay | Admitting: Emergency Medicine

## 2020-07-23 NOTE — Telephone Encounter (Signed)
-----   Message from Deboraha Sprang, MD sent at 07/20/2020  2:20 PM EDT ----- Remote reviewed. This remote is abnormal for progressive loss of CRT  can we arrange an office visti with me or APP to assess

## 2020-07-23 NOTE — Telephone Encounter (Signed)
LMOM per DPR to call device clinic , # and office hours provided.  Per Dr Caryl Comes needs appointment with him or EP App to optimize CRT-P pacing.

## 2020-07-25 ENCOUNTER — Other Ambulatory Visit: Payer: Self-pay | Admitting: Surgery

## 2020-07-25 ENCOUNTER — Encounter: Payer: Self-pay | Admitting: Internal Medicine

## 2020-07-25 ENCOUNTER — Other Ambulatory Visit (HOSPITAL_COMMUNITY): Payer: Self-pay | Admitting: Surgery

## 2020-07-25 DIAGNOSIS — Z90710 Acquired absence of both cervix and uterus: Secondary | ICD-10-CM | POA: Diagnosis not present

## 2020-07-25 DIAGNOSIS — Z4800 Encounter for change or removal of nonsurgical wound dressing: Secondary | ICD-10-CM | POA: Diagnosis not present

## 2020-07-25 DIAGNOSIS — E1142 Type 2 diabetes mellitus with diabetic polyneuropathy: Secondary | ICD-10-CM | POA: Diagnosis not present

## 2020-07-25 DIAGNOSIS — Z7982 Long term (current) use of aspirin: Secondary | ICD-10-CM | POA: Diagnosis not present

## 2020-07-25 DIAGNOSIS — I10 Essential (primary) hypertension: Secondary | ICD-10-CM | POA: Diagnosis not present

## 2020-07-25 DIAGNOSIS — L97412 Non-pressure chronic ulcer of right heel and midfoot with fat layer exposed: Secondary | ICD-10-CM

## 2020-07-25 DIAGNOSIS — Z794 Long term (current) use of insulin: Secondary | ICD-10-CM | POA: Diagnosis not present

## 2020-07-25 DIAGNOSIS — Z7901 Long term (current) use of anticoagulants: Secondary | ICD-10-CM | POA: Diagnosis not present

## 2020-07-25 DIAGNOSIS — I2510 Atherosclerotic heart disease of native coronary artery without angina pectoris: Secondary | ICD-10-CM | POA: Diagnosis not present

## 2020-07-25 DIAGNOSIS — I890 Lymphedema, not elsewhere classified: Secondary | ICD-10-CM

## 2020-07-25 DIAGNOSIS — Z79899 Other long term (current) drug therapy: Secondary | ICD-10-CM | POA: Diagnosis not present

## 2020-07-25 DIAGNOSIS — E11621 Type 2 diabetes mellitus with foot ulcer: Secondary | ICD-10-CM | POA: Diagnosis not present

## 2020-07-25 DIAGNOSIS — E782 Mixed hyperlipidemia: Secondary | ICD-10-CM | POA: Diagnosis not present

## 2020-07-30 DIAGNOSIS — F331 Major depressive disorder, recurrent, moderate: Secondary | ICD-10-CM | POA: Diagnosis not present

## 2020-07-30 DIAGNOSIS — Z6838 Body mass index (BMI) 38.0-38.9, adult: Secondary | ICD-10-CM | POA: Diagnosis not present

## 2020-07-30 DIAGNOSIS — I700 Atherosclerosis of aorta: Secondary | ICD-10-CM | POA: Diagnosis not present

## 2020-07-30 DIAGNOSIS — L97401 Non-pressure chronic ulcer of unspecified heel and midfoot limited to breakdown of skin: Secondary | ICD-10-CM | POA: Diagnosis not present

## 2020-07-30 DIAGNOSIS — K219 Gastro-esophageal reflux disease without esophagitis: Secondary | ICD-10-CM | POA: Diagnosis not present

## 2020-07-30 DIAGNOSIS — Z7901 Long term (current) use of anticoagulants: Secondary | ICD-10-CM | POA: Diagnosis not present

## 2020-07-30 DIAGNOSIS — Z794 Long term (current) use of insulin: Secondary | ICD-10-CM | POA: Diagnosis not present

## 2020-07-30 DIAGNOSIS — I5032 Chronic diastolic (congestive) heart failure: Secondary | ICD-10-CM | POA: Diagnosis not present

## 2020-07-30 DIAGNOSIS — L03115 Cellulitis of right lower limb: Secondary | ICD-10-CM | POA: Diagnosis not present

## 2020-07-30 DIAGNOSIS — I2510 Atherosclerotic heart disease of native coronary artery without angina pectoris: Secondary | ICD-10-CM | POA: Diagnosis not present

## 2020-07-30 DIAGNOSIS — I110 Hypertensive heart disease with heart failure: Secondary | ICD-10-CM | POA: Diagnosis not present

## 2020-07-30 DIAGNOSIS — E11621 Type 2 diabetes mellitus with foot ulcer: Secondary | ICD-10-CM | POA: Diagnosis not present

## 2020-07-30 DIAGNOSIS — E782 Mixed hyperlipidemia: Secondary | ICD-10-CM | POA: Diagnosis not present

## 2020-07-30 DIAGNOSIS — Z7982 Long term (current) use of aspirin: Secondary | ICD-10-CM | POA: Diagnosis not present

## 2020-07-30 DIAGNOSIS — I482 Chronic atrial fibrillation, unspecified: Secondary | ICD-10-CM | POA: Diagnosis not present

## 2020-07-30 DIAGNOSIS — E1151 Type 2 diabetes mellitus with diabetic peripheral angiopathy without gangrene: Secondary | ICD-10-CM | POA: Diagnosis not present

## 2020-07-30 DIAGNOSIS — I5023 Acute on chronic systolic (congestive) heart failure: Secondary | ICD-10-CM | POA: Diagnosis not present

## 2020-07-30 DIAGNOSIS — E876 Hypokalemia: Secondary | ICD-10-CM | POA: Diagnosis not present

## 2020-07-30 NOTE — Telephone Encounter (Signed)
Patient has appointment with Joseph Art, [place] APP 07/31/20

## 2020-07-31 ENCOUNTER — Ambulatory Visit (INDEPENDENT_AMBULATORY_CARE_PROVIDER_SITE_OTHER): Payer: Medicare Other | Admitting: Physician Assistant

## 2020-07-31 ENCOUNTER — Encounter: Payer: Self-pay | Admitting: Physician Assistant

## 2020-07-31 ENCOUNTER — Other Ambulatory Visit: Payer: Self-pay

## 2020-07-31 VITALS — BP 84/52 | HR 82 | Ht 67.0 in | Wt 247.0 lb

## 2020-07-31 DIAGNOSIS — R0600 Dyspnea, unspecified: Secondary | ICD-10-CM | POA: Diagnosis not present

## 2020-07-31 DIAGNOSIS — Z79899 Other long term (current) drug therapy: Secondary | ICD-10-CM

## 2020-07-31 DIAGNOSIS — I480 Paroxysmal atrial fibrillation: Secondary | ICD-10-CM

## 2020-07-31 DIAGNOSIS — I214 Non-ST elevation (NSTEMI) myocardial infarction: Secondary | ICD-10-CM

## 2020-07-31 DIAGNOSIS — I5022 Chronic systolic (congestive) heart failure: Secondary | ICD-10-CM | POA: Diagnosis not present

## 2020-07-31 DIAGNOSIS — I2510 Atherosclerotic heart disease of native coronary artery without angina pectoris: Secondary | ICD-10-CM

## 2020-07-31 DIAGNOSIS — I5023 Acute on chronic systolic (congestive) heart failure: Secondary | ICD-10-CM

## 2020-07-31 LAB — CUP PACEART INCLINIC DEVICE CHECK
Battery Remaining Longevity: 33 mo
Battery Voltage: 2.95 V
Brady Statistic AP VP Percent: 0 %
Brady Statistic AP VS Percent: 0 %
Brady Statistic AS VP Percent: 0 %
Brady Statistic AS VS Percent: 0 %
Brady Statistic RA Percent Paced: 0 %
Brady Statistic RV Percent Paced: 84.97 %
Date Time Interrogation Session: 20220421194051
HighPow Impedance: 66 Ohm
Implantable Lead Implant Date: 20180105
Implantable Lead Implant Date: 20180105
Implantable Lead Location: 753858
Implantable Lead Location: 753860
Implantable Lead Model: 4598
Implantable Pulse Generator Implant Date: 20180105
Lead Channel Impedance Value: 199.5 Ohm
Lead Channel Impedance Value: 199.5 Ohm
Lead Channel Impedance Value: 234.706
Lead Channel Impedance Value: 234.706
Lead Channel Impedance Value: 234.706
Lead Channel Impedance Value: 399 Ohm
Lead Channel Impedance Value: 399 Ohm
Lead Channel Impedance Value: 399 Ohm
Lead Channel Impedance Value: 4047 Ohm
Lead Channel Impedance Value: 513 Ohm
Lead Channel Impedance Value: 532 Ohm
Lead Channel Impedance Value: 570 Ohm
Lead Channel Impedance Value: 627 Ohm
Lead Channel Impedance Value: 703 Ohm
Lead Channel Impedance Value: 722 Ohm
Lead Channel Impedance Value: 893 Ohm
Lead Channel Impedance Value: 893 Ohm
Lead Channel Impedance Value: 893 Ohm
Lead Channel Pacing Threshold Amplitude: 0.5 V
Lead Channel Pacing Threshold Amplitude: 1.125 V
Lead Channel Pacing Threshold Pulse Width: 0.4 ms
Lead Channel Pacing Threshold Pulse Width: 0.8 ms
Lead Channel Sensing Intrinsic Amplitude: 12.625 mV
Lead Channel Sensing Intrinsic Amplitude: 14.125 mV
Lead Channel Setting Pacing Amplitude: 1.75 V
Lead Channel Setting Pacing Amplitude: 2 V
Lead Channel Setting Pacing Pulse Width: 0.4 ms
Lead Channel Setting Pacing Pulse Width: 0.8 ms
Lead Channel Setting Sensing Sensitivity: 0.3 mV

## 2020-07-31 NOTE — Patient Instructions (Addendum)
  Medication Instructions:   STOP TAKING LOSARTAN AND ASPIRIN   *If you need a refill on your cardiac medications before your next appointment, please call your pharmacy*   Lab Work:  BMET AND BNP  TODAY    If you have labs (blood work) drawn today and your tests are completely normal, you will receive your results only by: Marland Kitchen MyChart Message (if you have MyChart) OR . A paper copy in the mail If you have any lab test that is abnormal or we need to change your treatment, we will call you to review the results.   Testing/Procedures: NONE ORDERED  TODAY    Follow-Up: At [facility], you and your health needs are our priority.  As part of our continuing mission to provide you with exceptional heart care, we have created designated Provider Care Teams.  These Care Teams include your primary Cardiologist (physician) and Advanced Practice Providers (APPs -  Physician Assistants and Nurse Practitioners) who all work together to provide you with the care you need, when you need it.  We recommend signing up for the patient portal called "MyChart".  Sign up information is provided on this After Visit Summary.  MyChart is used to connect with patients for Virtual Visits (Telemedicine).  Patients are able to view lab/test results, encounter notes, upcoming appointments, etc.  Non-urgent messages can be sent to your provider as well.   To learn more about what you can do with MyChart, go to [email or web address].    Your next appointment:    NEXT WEEK WITH [facility] OR REGULAR  APP (CONTACT [facility] IF ANY ISSUES)  The format for your next appointment:   In Person  Provider:   Beryle Beams" [place], PA-C   DR [name] OR APP AS SOON AS POSSIBLE   Other Instructions  PLEASE [place] BLOOD PRESSURE TOMORROW BEFORE MEDICINES AND CALL IF BLOOD PRESSURE LOWER THAN 100 ON TOP NUMBER.

## 2020-07-31 NOTE — Progress Notes (Signed)
Cardiology Office Note Date:  07/31/2020  Patient ID:  Victoria Adams, Victoria Adams 1940-12-23, MRN [id number] PCP:  Caryl Bis, MD  Cardiologist:  Dr. Haroldine Laws Electrophysiologist: Dr. Caryl Comes   Chief Complaint:  device  optimization  History of Present Illness: Victoria Adams is a 80 y.o. female with history of morbid obesity HTN, DM, Afib (permanent), CAD, NICM, chronic CHF (systolic), LBBB, chronic back pain, stroke (hemorrhagic), CAD, lymphedema  She comes in today to be seen for Dr. Caryl Comes, last seen by him 2018, discussed significant PVC burden though in the environment of normalized LVEF and diminished CRT pacing to mid 90's because of that, no intervention/particular treatment for them.  Most recently saw Dr. Haroldine Laws Jan 2022, LVEF by last echo remained improved 60-65%, volume status was good, low dose coreg was added, planned to refer to EP to evaluate reduced BP %  she was admitted to [facility] 07/14/20 for HF exacerbation and R foot wound after a cat scratch.  IV diuretics and IV antibiotics given.  Foot required bedside surgical I&D Home on 07/20/20 with plans for wound care clinic follow up Coreg stopped and started metoprolol  succ and started on losartan and ASA 325mg  daily I do not see that her home diuretics were changed (daughter confirmed) Blood Cx were neg x2 for 5 days She had some electrolyte imbalances and AKI during her stay TTE noted LVEF 55-60%, mild RV dilation normal RVEF, LA severely dilated, mod TR, PA 11mmg, RA severely dilated  Following with wound clinic for R foot wound, seems  At her visit 07/25/20 planned for compression wraps and home health to get started  TODAY She is accompanied by her daughter today Since discharge she has been very tired and weak. Yesterday she had a fall, states she was with her walker though placed it to the ide to do something and lost her balance/legs gave way and she fell.  She was able to get up on her own and is certain that she  did not faint and did not suffer any injusry. [name] was the 1st home health visit and not back again until Monday. There report that she took her vitals did not report to them if her BP was low or any abnormal readings.  Her daughter mentions that for a week or more leading to her hospital she has been having diarrhea, unknown what from and discussed with her PMD, before and after her hospital stay. Unchanged with the antibiotiocs, described a 3-4 loose stools some watery a day Today none so far She saw her PMD a couple days after getting home had labs, meds reviewed and reportedly labs were stable and kindey function better.  The patient denies any CP, palpitations or cardiac awareness, she also denies SOB She denies symptoms of PND or orthopnea She remains swollen, she has a qrap on her RLR mid shin and distally, dorsum iof the exposed foot is swollen and erythematous.  They denies fever or symptoms of illness   Her daughter and the patient report she had been doing well until the couple of weeks leading to her hospital stay.   Device information MDR CRT -D, implanted /08/2016   Past Medical History:  Diagnosis Date  . Allergic rhinitis   . 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 Atrial fibrillation ([place])   . Chronic low back pain    Secondary to DJD  . Chronic systolic heart failure ([place])    a. echo (5/15):  mod LVH, EF 20-25%, diff HK, severe LAE, mild RAE  . Coronary atherosclerosis-non obstructive    LHC (5/15):  EF 40-45% global HK; long LAD 40-60%, ostial 1st major septal perf 80-90%, ostial CFX ?%, mid AVCFX extensive Ca2+, dist PDA 50%  . Depression   . Essential hypertension, benign   . Fatty liver disease, nonalcoholic   . GERD (gastroesophageal reflux disease)   . Iron deficiency anemia   . Mitral regurgitation   . NICM (nonischemic cardiomyopathy) ([place])    a. echo (5/15):  mod LVH, EF 20-25%, diff HK, severe LAE, mild RAE  . NSTEMI (non-ST elevated myocardial  infarction) ([place]) may 2015   No CAD  . Obesity   . Osteopenia   . PONV (postoperative nausea and vomiting)   . Rosacea   . Type 2 diabetes mellitus ([place])   . Vitamin D deficiency     Past Surgical History:  Procedure Laterality Date  . ABDOMINAL HYSTERECTOMY    . APPENDECTOMY    . BREAST BIOPSY     RIGHT  . CARDIAC CATHETERIZATION  2010   LAD 50%, CFX 30%, RCA 40%; EF by echo 25-35%  . CATARACT EXTRACTION Bilateral   . CHOLECYSTECTOMY    . COLONOSCOPY WITH PROPOFOL N/A 12/20/2013   Procedure: COLONOSCOPY WITH PROPOFOL;  Surgeon: Milus Banister, MD;  Location: [facility] ENDOS[facility];  Service: Endoscopy;  Laterality: N/A;  . EP IMPLANTABLE DEVICE N/A 04/16/2016   Procedure: BiV ICD Insertion CRT-D;  Surgeon: Deboraha Sprang, MD;  Location: [facility];  Service: Cardiovascular;  Laterality: N/A;  . ESOPHAGOGASTRODUODENOSCOPY N/A 08/25/2013   Procedure: ESOPHAGOGASTRODUODENOSCOPY (EGD);  Surgeon: Milus Banister, MD;  Location: [place];  Service: Endoscopy;  Laterality: N/A;  . LEFT HEART CATHETERIZATION WITH CORONARY ANGIOGRAM N/A 08/24/2013   Procedure: LEFT HEART CATHETERIZATION WITH CORONARY ANGIOGRAM;  Surgeon: Leonie Man, MD;  Location: [facility] CATH LAB;  Service: Cardiovascular;  Laterality: N/A;  . LUMBAR PERCUTANEOUS PEDICLE SCREW 2 LEVEL N/A 02/15/2019   Procedure: Percutaneous Pedicle Screw Fixation from Thoracic Ten-Thoracic Twelve with Methylmethacrylate Screw Augmentation;  Surgeon: Earnie Larsson, MD;  Location: [facility];  Service: Neurosurgery;  Laterality: N/A;  . ROTATOR CUFF REPAIR     RIGHT SHOULDER  . TONSILLECTOMY  yrs ago  . TOTAL ABDOMINAL HYSTERECTOMY W/ BILATERAL SALPINGOOPHORECTOMY     for dermoid tumor    Current Outpatient Medications  Medication Sig Dispense Refill  . Calcium Carb-Cholecalciferol (CALCIUM 600 + D PO) Take 1 tablet by mouth 2 (two) times daily.     . carvedilol (COREG) 3.125 MG tablet Take 1 tablet (3.125 mg total) by mouth 2 (two) times daily  with a meal. 60 tablet 10  . colchicine 0.6 MG tablet Take 1 tablet (0.6 mg total) by mouth daily. 30 tablet 0  . doxycycline (VIBRAMYCIN) 100 MG capsule Take 100-200 mg by mouth 2 (two) times daily as needed (rosacea).     Marland Kitchen ELIQUIS 5 MG TABS tablet TAKE (1) TABLET TWICE DAILY. 60 tablet 3  . ferrous sulfate 325 (65 FE) MG tablet TAKE 1 TABLET ONCE DAILY WITH BREAKFAST. 30 tablet 0  . gabapentin (NEURONTIN) 300 MG capsule Take 1 capsule (300 mg total) by mouth 3 (three) times daily. 90 capsule 0  . HYDROcodone-acetaminophen (NORCO) 10-325 MG tablet Take 1 tablet by mouth every 6 (six) hours as needed for moderate pain (To be taken 3 hours after Dilaudid, if Dilaudid is ineffective).     . insulin glargine (LANTUS) 100  UNIT/ML injection Inject 0.6-1 mLs (60-100 Units total) into the skin 2 (two) times daily. 100 units in the morning and 60 units in the evening 10 mL 11  . LORazepam (ATIVAN) 0.5 MG tablet Take 0.5 mg by mouth 3 (three) times daily as needed.    . metolazone (ZAROXOLYN) 2.5 MG tablet TAKE 1 TABLET EVERY TUESDAY AND FRIDAY ALONG WITH THE 40 MEQ OF POTASSIUM. 6 tablet 3  . omeprazole (PRILOSEC) 20 MG capsule Take 20 mg by mouth 2 (two) times daily before a meal.    . ondansetron (ZOFRAN ODT) 4 MG disintegrating tablet Take 1 tablet (4 mg total) by mouth every 8 (eight) hours as needed for nausea or vomiting. 20 tablet 0  . polyethylene glycol (MIRALAX / GLYCOLAX) packet Take 17 g by mouth daily.    . potassium chloride SA (KLOR-CON) 20 MEQ tablet TAKE 2 TABLETS TWICE DAILY. TAKE ADDITIONAL 2 TABLETS WITH EACH WEEKLY METOLAZONE DOSE ON TUESDAY AND FRIDAY. 150 tablet 0  . QC NATURAL VEGETABLE LAXATIVE 8.6 MG tablet Take by mouth.    . rosuvastatin (CRESTOR) 20 MG tablet TAKE 1 TABLET BY MOUTH ONCE DAILY. 30 tablet 11  . senna-docusate (SENOKOT-S) 8.6-50 MG tablet Take 2 tablets by mouth 2 (two) times daily. 60 tablet 0  . sertraline (ZOLOFT) 100 MG tablet Take 100 mg by mouth daily.    Marland Kitchen  torsemide (DEMADEX) 20 MG tablet TAKE 4 TABLETS EVERY MORNING AND 2 TABLETS EVERY EVENING. 540 tablet 0   No current facility-administered medications for this visit.    Allergies:   Codeine and Tylox [oxycodone-acetaminophen]   Social History:  The patient  reports that she has never smoked. She has never used smokeless tobacco. She reports that she does not drink alcohol and does not use drugs.   Family History:  The patient's family history includes Asthma in her brother; Coronary artery disease in her father; Diabetes in her father; Lung cancer in her father and mother; Other in her grandchild.  ROS:  Please see the history of present illness.    All other systems are reviewed and otherwise negative.   PHYSICAL EXAM:  VS:  There were no vitals taken for this visit. BMI: There is no height or weight on file to calculate BMI. Well nourished, well developed, in no acute distress HEENT: normocephalic, atraumatic Neck: no JVD in a seated position, no carotid bruits or masses Cardiac:  RRR; w/extrasystoles no significant murmurs, no rubs, or gallops Lungs:  CTA b/l, no wheezing, rhonchi or rales Abd: soft, nontender MS: no deformity or atrophy Ext: RLE wrapped to mid shin, exposed foot dorsum is erythematous and swollen, RLE with ted ose on with 1-2+ edema, hose removed skin looks good Skin: warm and dry, no rash Neuro:  No gross deficits appreciated Psych: euthymic mood, full affect  ICD site is stable, no tethering or discomfort   EKG:  Done today and reviewed by myself shows  V paced complexes are +V1 and neg lead one, QRS 165ms, PVCs  Device interrogation done today and reviewed by myself:  Battery and lead measurements are good No arrhythmias VP 85%, effective 69%  She has had some V sensed events and in observation today be EKG and device, fairly frequent PVCs   07/15/2020: TTE Summary  1. The left ventricle is normal in size with mildly increased wall  thickness.   2. The left ventricular systolic function is normal, LVEF is visually  estimated at 55-60%.  3. There is  mild to moderate mitral valve regurgitation.  4. The left atrium is severely dilated in size.  5. The right ventricle is mildly dilated in size, with normal systolic  function.  6. There is moderate tricuspid regurgitation.  7. There is mild pulmonary hypertension, estimated pulmonary artery systolic  pressure is 45 mmHg.  8. The right atrium is severely dilated in size.     05/08/20: TTE IMPRESSIONS  1. Left ventricular ejection fraction, by estimation, is 55 to 60%. The  left ventricle has normal function. The left ventricle has no regional  wall motion abnormalities. The left ventricular internal cavity size was  mildly dilated. Left ventricular  diastolic parameters are indeterminate.  2. Right ventricular systolic function is normal. The right ventricular  size is normal. There is mildly elevated pulmonary artery systolic  pressure.  3. Left atrial size was mildly dilated.  4. The mitral valve is normal in structure. Trivial mitral valve  regurgitation. No evidence of mitral stenosis.  5. The aortic valve is tricuspid. Aortic valve regurgitation is not  visualized. Mild to moderate aortic valve sclerosis/calcification is  present, without any evidence of aortic stenosis.  6. The inferior vena cava is normal in size with greater than 50%  respiratory variability, suggesting right atrial pressure of 3 mmHg.    02/08/2019: LVEF 60-65% 06/15/16: LVEF 60-65% 2017 LVEF 20-25%  Recent Labs: 05/08/2020: BUN 49; Creatinine, Ser 2.47; Potassium 3.2; Sodium 137  No results found for requested labs within last 8760 hours.   CrCl cannot be calculated (Patient's most recent lab result is older than the maximum 21 days allowed.).   Wt Readings from Last 3 Encounters:  05/08/20 225 lb 12.8 oz (102.4 kg)  05/07/19 247 lb 9.6 oz (112.3 kg)  04/23/19 256 lb (116.1  kg)     Other studies reviewed: Additional studies/records reviewed today include: summarized above  ASSESSMENT AND PLAN:  1. CRT-D     Stable measurements     No arrhythmias  Going back  July 2020 VP 89.6%, effective 87% Jan 2021  88%/86% May 2021 88%/86%  She has had poor pacing and effective % chronically Acute change today I suspect is PVCs EKG morphology looks pretty good from her LBBB pre-implant QRS duration improved slightly lfrom 162 pre-implant > 156 She does not have BP for titration of her BB I have increased her base pacing rate from 60-75bpm to try and suppress some of her PVCs Will have device check next week as well  2. NICM 3. Chronic CHF     LVEF remains improved/normalized     low effective CRT and base pacing %     Volume OL with a recent hospital stay with IV diuresis OptiVol is still up Hypotensive a recheck by myself was 90/56, 88/56, this is likely the cause of her significant fatigue/weakness She is not SOB Daughter reports edema better but only minimally then prior to going to the hospital  I reviewed th case with DOD, D. Chrandrasekhar who examined and discussed today's concerns and findings Stop losartan and ASA While volume OL she is also having diarrhea and hesitant to increase diuretics just yet with her BP BMET and BNP today and next week Follow up next week Revisit with Dr. Haroldine Laws or HF APP ASAP   4. CAD     Moderate diffuse mid LAD disease of 40-60%. Septal perforator branch with ostial ~90%     No anginal symptoms  5. Permanent AFib     CHA2DS2Vasc is  8, on eliquis, appropriately dosed      Dr Dayna Ramus discussed with the patient and her daughter Not clearly needed for hospitalization today though if medication adjustments do not improve her BP or has any deterioration discussed symptoms and red flags to go to the hospital for.  I have asked them to take her BP prior to her medicines and if <100 to call as well. I have  asked her not to ambulate without help today/tomorrow until her BP is better and feeling better   Disposition: F/u as above, hospital precautions discussed  Current medicines are reviewed at length with the patient today.  The patient did not have any concerns regarding medicines.  Venetia Night, PA-C 07/31/2020 5:29 AM     [facility] [address] [place] [address] [phone] (office)  [phone] (fax)

## 2020-08-01 ENCOUNTER — Telehealth: Payer: Self-pay | Admitting: *Deleted

## 2020-08-01 LAB — BASIC METABOLIC PANEL
BUN/Creatinine Ratio: 22 (ref 12–28)
BUN: 41 mg/dL — ABNORMAL HIGH (ref 8–27)
CO2: 30 mmol/L — ABNORMAL HIGH (ref 20–29)
Calcium: 8.9 mg/dL (ref 8.7–10.3)
Chloride: 95 mmol/L — ABNORMAL LOW (ref 96–106)
Creatinine, Ser: 1.83 mg/dL — ABNORMAL HIGH (ref 0.57–1.00)
Glucose: 210 mg/dL — ABNORMAL HIGH (ref 65–99)
Potassium: 4.8 mmol/L (ref 3.5–5.2)
Sodium: 142 mmol/L (ref 134–144)
eGFR: 28 mL/min/{1.73_m2} — ABNORMAL LOW (ref >59)

## 2020-08-01 LAB — PRO B NATRIURETIC PEPTIDE: NT-Pro BNP: 2985 pg/mL — ABNORMAL HIGH (ref 0–738)

## 2020-08-01 NOTE — Telephone Encounter (Signed)
Lvm on home phone and Dtr Fonnie Mu vm also

## 2020-08-05 ENCOUNTER — Other Ambulatory Visit (HOSPITAL_COMMUNITY): Payer: Self-pay | Admitting: Cardiology

## 2020-08-05 ENCOUNTER — Other Ambulatory Visit: Payer: Self-pay

## 2020-08-05 ENCOUNTER — Ambulatory Visit (HOSPITAL_COMMUNITY)
Admission: RE | Admit: 2020-08-05 | Discharge: 2020-08-05 | Disposition: A | Discharge status: Home / Self Care | Payer: Medicare Other | Source: Ambulatory Visit | Attending: Surgery | Admitting: Surgery

## 2020-08-05 ENCOUNTER — Other Ambulatory Visit (HOSPITAL_COMMUNITY): Payer: Self-pay | Admitting: Adult Health

## 2020-08-05 DIAGNOSIS — L97412 Non-pressure chronic ulcer of right heel and midfoot with fat layer exposed: Secondary | ICD-10-CM | POA: Diagnosis not present

## 2020-08-05 DIAGNOSIS — E1142 Type 2 diabetes mellitus with diabetic polyneuropathy: Secondary | ICD-10-CM | POA: Diagnosis not present

## 2020-08-05 DIAGNOSIS — I890 Lymphedema, not elsewhere classified: Secondary | ICD-10-CM | POA: Insufficient documentation

## 2020-08-05 DIAGNOSIS — Z794 Long term (current) use of insulin: Secondary | ICD-10-CM | POA: Diagnosis not present

## 2020-08-05 DIAGNOSIS — E11621 Type 2 diabetes mellitus with foot ulcer: Secondary | ICD-10-CM | POA: Diagnosis not present

## 2020-08-05 DIAGNOSIS — Z4800 Encounter for change or removal of nonsurgical wound dressing: Secondary | ICD-10-CM | POA: Diagnosis not present

## 2020-08-05 DIAGNOSIS — I739 Peripheral vascular disease, unspecified: Secondary | ICD-10-CM | POA: Diagnosis not present

## 2020-08-06 ENCOUNTER — Ambulatory Visit (INDEPENDENT_AMBULATORY_CARE_PROVIDER_SITE_OTHER): Payer: Medicare Other | Admitting: Student

## 2020-08-06 ENCOUNTER — Encounter: Payer: Self-pay | Admitting: Student

## 2020-08-06 VITALS — BP 118/60 | HR 100 | Ht 67.0 in | Wt 247.0 lb

## 2020-08-06 DIAGNOSIS — I5023 Acute on chronic systolic (congestive) heart failure: Secondary | ICD-10-CM

## 2020-08-06 DIAGNOSIS — I428 Other cardiomyopathies: Secondary | ICD-10-CM | POA: Diagnosis not present

## 2020-08-06 DIAGNOSIS — I480 Paroxysmal atrial fibrillation: Secondary | ICD-10-CM

## 2020-08-06 DIAGNOSIS — I2510 Atherosclerotic heart disease of native coronary artery without angina pectoris: Secondary | ICD-10-CM

## 2020-08-06 NOTE — Addendum Note (Signed)
Addended by: Loren Racer on: 08/06/2020 01:49 PM   Modules accepted: Orders

## 2020-08-06 NOTE — Progress Notes (Signed)
Cardiology Office Note Date:  08/06/2020  Patient ID:  Victoria Adams, Victoria Adams June 17, 1940, MRN [id number] PCP:  Caryl Bis, MD  Cardiologist:  Dr. Haroldine Laws Electrophysiologist: Dr. Caryl Comes   Chief Complaint:  CHF  History of Present Illness: Victoria Adams is a 80 y.o. female with history of morbid obesity HTN, DM, Afib (permanent), CAD, NICM, chronic CHF (systolic), LBBB, chronic back pain, stroke (hemorrhagic), CAD, lymphedema  Last seen by Dr. Caryl Comes 2018, discussed significant PVC burden though in the environment of normalized LVEF and diminished CRT pacing to mid 90's because of that, no intervention/particular treatment for them.  Most recently saw Dr. Haroldine Laws Jan 2022, LVEF by last echo remained improved 60-65%, volume status was good, low dose coreg was added, planned to refer to EP to evaluate reduced BP %  she was admitted to [facility] 07/14/20 for HF exacerbation and R foot wound after a cat scratch.  IV diuretics and IV antibiotics given.  Foot required bedside surgical I&D Home on 07/20/20 with plans for wound care clinic follow up Coreg stopped and started metoprolol  succ and started on losartan and ASA 325mg  daily I do not see that her home diuretics were changed (daughter confirmed) Blood Cx were neg x2 for 5 days She had some electrolyte imbalances and AKI during her stay TTE noted LVEF 55-60%, mild RV dilation normal RVEF, LA severely dilated, mod TR, PA 34mmg, RA severely dilated  Following with wound clinic for R foot wound, seems  At her visit 07/25/20 planned for compression wraps and home health to get started  4/21 - Visit with [name] She is accompanied by her daughter today Since discharge she has been very tired and weak. Yesterday she had a fall, states she was with her walker though placed it to the ide to do something and lost her balance/legs gave way and she fell.  She was able to get up on her own and is certain that she did not faint and did not suffer any  injusry. [name] was the 1st home health visit and not back again until Monday. There report that she took her vitals did not report to them if her BP was low or any abnormal readings.  Her daughter mentions that for a week or more leading to her hospital she has been having diarrhea, unknown what from and discussed with her PMD, before and after her hospital stay. Unchanged with the antibiotiocs, described a 3-4 loose stools some watery a day Today none so far She saw her PMD a couple days after getting home had labs, meds reviewed and reportedly labs were stable and kindey function better.  The patient denies any CP, palpitations or cardiac awareness, she also denies SOB She denies symptoms of PND or orthopnea She remains swollen, she has a qrap on her RLR mid shin and distally, dorsum iof the exposed foot is swollen and erythematous.  They denies fever or symptoms of illness   Her daughter and the patient report she had been doing well until the couple of weeks leading to her hospital stay.  TODAY Pt is awake, alert, and participating in conversation and the visit today.  She had several more soft BPs, but overall BP has improved and is stable today.   HH is coming out to change wound dressing three times weekly, but they have had issues getting them to come out regularly.   She did stumble in the parking lot today due to imbalance. But denies injury. R  leg remains swollen > Left.   Drinking a lot more fluid and not watching sodium intake per daughter. Having pre-prepared or frozen meals often and keeping a "cup of water" with her all day.   Baseline weight is felt to be in upper 220s per patient, and was 225 at CHF visit 04/2020.  Device information MDR CRT -D, implanted 08/2016   Past Medical History:  Diagnosis Date  . Allergic rhinitis   . 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 Atrial fibrillation ([place])   . Chronic low back pain    Secondary to DJD  . Chronic systolic heart  failure (HCC)    a. echo (5/15):  mod LVH, EF 20-25%, diff HK, severe LAE, mild RAE  . Coronary atherosclerosis-non obstructive    LHC (5/15):  EF 40-45% global HK; long LAD 40-60%, ostial 1st major septal perf 80-90%, ostial CFX ?%, mid AVCFX extensive Ca2+, dist PDA 50%  . Depression   . Essential hypertension, benign   . Fatty liver disease, nonalcoholic   . GERD (gastroesophageal reflux disease)   . Iron deficiency anemia   . Mitral regurgitation   . NICM (nonischemic cardiomyopathy) ([place])    a. echo (5/15):  mod LVH, EF 20-25%, diff HK, severe LAE, mild RAE  . NSTEMI (non-ST elevated myocardial infarction) ([place]) may 2015   No CAD  . Obesity   . Osteopenia   . PONV (postoperative nausea and vomiting)   . Rosacea   . Type 2 diabetes mellitus ([place])   . Vitamin D deficiency     Past Surgical History:  Procedure Laterality Date  . ABDOMINAL HYSTERECTOMY    . APPENDECTOMY    . BREAST BIOPSY     RIGHT  . CARDIAC CATHETERIZATION  2010   LAD 50%, CFX 30%, RCA 40%; EF by echo 25-35%  . CATARACT EXTRACTION Bilateral   . CHOLECYSTECTOMY    . COLONOSCOPY WITH PROPOFOL N/A 12/20/2013   Procedure: COLONOSCOPY WITH PROPOFOL;  Surgeon: Milus Banister, MD;  Location: [facility];  Service: Endoscopy;  Laterality: N/A;  . EP IMPLANTABLE DEVICE N/A 04/16/2016   Procedure: BiV ICD Insertion CRT-D;  Surgeon: Deboraha Sprang, MD;  Location: [facility];  Service: Cardiovascular;  Laterality: N/A;  . ESOPHAGOGASTRODUODENOSCOPY N/A 08/25/2013   Procedure: ESOPHAGOGASTRODUODENOSCOPY (EGD);  Surgeon: Milus Banister, MD;  Location: [facility];  Service: Endoscopy;  Laterality: N/A;  . LEFT HEART CATHETERIZATION WITH CORONARY ANGIOGRAM N/A 08/24/2013   Procedure: LEFT HEART CATHETERIZATION WITH CORONARY ANGIOGRAM;  Surgeon: Leonie Man, MD;  [facility] C[facility];  Service: Cardiovascular;  Laterality: N/A;  . LUMBAR PERCUTANEOUS PEDICLE SCREW 2 LEVEL N/A 02/15/2019   Procedure:  Percutaneous Pedicle Screw Fixation from Thoracic Ten-Thoracic Twelve with Methylmethacrylate Screw Augmentation;  Surgeon: Earnie Larsson, MD;  Location: [facility];  Service: Neurosurgery;  Laterality: N/A;  . ROTATOR CUFF REPAIR     RIGHT SHOULDER  . TONSILLECTOMY  yrs ago  . TOTAL ABDOMINAL HYSTERECTOMY W/ BILATERAL SALPINGOOPHORECTOMY     for dermoid tumor    Current Outpatient Medications  Medication Sig Dispense Refill  . ascorbic acid (VITAMIN C) 1000 MG tablet Take 1,000 mg by mouth daily.    . Calcium Carb-Cholecalciferol (CALCIUM 600 + D PO) Take 1 tablet by mouth 2 (two) times daily.     Marland Kitchen doxycycline (VIBRAMYCIN) 100 MG capsule Take 100-200 mg by mouth 2 (two) times daily as needed (rosacea).     Marland Kitchen ELIQUIS 5  MG TABS tablet TAKE (1) TABLET TWICE DAILY. 60 tablet 3  . ferrous sulfate 325 (65 FE) MG tablet TAKE 1 TABLET ONCE DAILY WITH BREAKFAST. 30 tablet 0  . gabapentin (NEURONTIN) 300 MG capsule Take 1 capsule (300 mg total) by mouth 3 (three) times daily. 90 capsule 0  . HYDROcodone-acetaminophen (NORCO) 10-325 MG tablet Take 1 tablet by mouth every 6 (six) hours as needed for moderate pain (To be taken 3 hours after Dilaudid, if Dilaudid is ineffective).     . insulin glargine (LANTUS) 100 UNIT/ML injection Inject 30 Units into the skin daily.    Marland Kitchen LORazepam (ATIVAN) 0.5 MG tablet Take 0.5 mg by mouth 3 (three) times daily as needed.    . Magnesium Oxide 400 MG CAPS Take 400 mg by mouth 2 (two) times daily.    . metolazone (ZAROXOLYN) 2.5 MG tablet TAKE 1 TABLET EVERY TUESDAY AND FRIDAY ALONG WITH THE 40 MEQ OF POTASSIUM. 6 tablet 3  . metoprolol succinate (TOPROL-XL) 50 MG 24 hr tablet Take 50 mg by mouth daily.    Marland Kitchen omeprazole (PRILOSEC) 20 MG capsule Take 20 mg by mouth 2 (two) times daily before a meal.    . ondansetron (ZOFRAN ODT) 4 MG disintegrating tablet Take 1 tablet (4 mg total) by mouth every 8 (eight) hours as needed for nausea or vomiting. 20 tablet 0  . potassium  chloride SA (KLOR-CON) 20 MEQ tablet TAKE 2 TABLETS TWICE DAILY. TAKE ADDITIONAL 2 TABLETS WITH EACH WEEKLY METOLAZONE DOSE ON TUESDAY AND FRIDAY. 150 tablet 0  . QC NATURAL VEGETABLE LAXATIVE 8.6 MG tablet Take by mouth.    . rosuvastatin (CRESTOR) 20 MG tablet TAKE 1 TABLET BY MOUTH ONCE DAILY. 30 tablet 11  . sertraline (ZOLOFT) 100 MG tablet Take 100 mg by mouth daily.    Marland Kitchen zinc gluconate 50 MG tablet Take 1 tablet by mouth daily.    Marland Kitchen torsemide (DEMADEX) 20 MG tablet Take 4 tablets (80 mg total) by mouth every morning AND 2 tablets (40 mg total) every evening. 180 tablet 11   No current facility-administered medications for this visit.    Allergies:   Codeine and Tylox [oxycodone-acetaminophen]   Social History:  The patient  reports that she has never smoked. She has never used smokeless tobacco. She reports that she does not drink alcohol and does not use drugs.   Family History:  The patient's family history includes Asthma in her brother; Coronary artery disease in her father; Diabetes in her father; Lung cancer in her father and mother; Other in her grandchild.  Review of systems complete and found to be negative unless listed in HPI.    PHYSICAL EXAM:  VS:  BP 118/60   Pulse 100   Ht 5\' 7"  (1.702 m)   Wt 247 lb (112 kg)   SpO2 95%   BMI 38.69 kg/m  BMI: Body mass index is 38.69 kg/m. General: Well appearing. No resp difficulty. HEENT: Normal Neck: Supple. JVP difficult to assess due to body habitus but appears at least 8-9 cm. Carotids 2+ bilat; no bruits. No thyromegaly or nodule noted. Cor: PMI nondisplaced. RRR, No M/G/R noted Lungs: CTAB, normal effort. Abdomen: Soft, non-tender, non-distended, no HSM. No bruits or masses. +BS  Extremities: Bilateral 2-3+ edema 2/3 way to knees. 1+ thigh edema. R leg slightly larger; wound covered.  Neuro: Alert & orientedx3, cranial nerves grossly intact. moves all 4 extremities w/o difficulty. Affect pleasant   ICD site stable.  EKG:  Is not done today.  V paced complexes are +V1 and neg lead one, QRS 162ms, PVCs  Device interrogation Performed today and WNL.  VP 90%, effective ~77%, somewhat improved with higher LRL.  She continues to have PVCs   07/15/2020: TTE Summary  1. The left ventricle is normal in size with mildly increased wall  thickness.  2. The left ventricular systolic function is normal, LVEF is visually  estimated at 55-60%.  3. There is mild to moderate mitral valve regurgitation.  4. The left atrium is severely dilated in size.  5. The right ventricle is mildly dilated in size, with normal systolic  function.  6. There is moderate tricuspid regurgitation.  7. There is mild pulmonary hypertension, estimated pulmonary artery systolic  pressure is 45 mmHg.  8. The right atrium is severely dilated in size.     05/08/20: TTE IMPRESSIONS  1. Left ventricular ejection fraction, by estimation, is 55 to 60%. The  left ventricle has normal function. The left ventricle has no regional  wall motion abnormalities. The left ventricular internal cavity size was  mildly dilated. Left ventricular  diastolic parameters are indeterminate.  2. Right ventricular systolic function is normal. The right ventricular  size is normal. There is mildly elevated pulmonary artery systolic  pressure.  3. Left atrial size was mildly dilated.  4. The mitral valve is normal in structure. Trivial mitral valve  regurgitation. No evidence of mitral stenosis.  5. The aortic valve is tricuspid. Aortic valve regurgitation is not  visualized. Mild to moderate aortic valve sclerosis/calcification is  present, without any evidence of aortic stenosis.  6. The inferior vena cava is normal in size with greater than 50%  respiratory variability, suggesting right atrial pressure of 3 mmHg.    02/08/2019: LVEF 60-65% 06/15/16: LVEF 60-65% 2017 LVEF 20-25%  Recent Labs: 07/31/2020: BUN 41; Creatinine,  Ser 1.83; NT-Pro BNP 2,985; Potassium 4.8; Sodium 142  No results found for requested labs within last 8760 hours.   Estimated Creatinine Clearance: 32.7 mL/min (A) (by C-G formula based on SCr of 1.83 mg/dL (H)).   Wt Readings from Last 3 Encounters:  08/06/20 247 lb (112 kg)  07/31/20 247 lb (112 kg)  05/08/20 225 lb 12.8 oz (102.4 kg)     Other studies reviewed: Additional studies/records reviewed today include: summarized above  ASSESSMENT AND PLAN:  1. CRT-D Stable device measurements today.  I think PVCs are more frequent in setting of acute illness and her percentages will be closer to previous amounts as she convalesces.  VP ~ 90% and effective improved to around 77%. Still below chronic levels.    2. NICM 3. Chronic CHF     LVEF remains improved/normalized     low effective CRT and base pacing % as bove Volume status remains elevated in the setting of sedentary nature of the past few weeks and dietary non-compliance, coupled with hospital stay for infection, where she was likely given IVF.  Optivol remains elevated.  Continue torsemide 80 mg q am and 40 mg q pm Continue metolazone 2.5 mg Tue and Friday with extra potassium.  She will take an extra metolazone + potassium on Sunday if weight does not begin to trend down with dietary and fluid adjustments.  Encouraged her to work with PT.  Repeat BMET today.     4. CAD Moderate diffuse mid LAD disease of 40-60%. Septal perforator branch with ostial ~90% Denies anginal symptoms.   5. Permanent AFib CHA2DS2Vasc is  8, on eliquis, appropriately dosed      Reviewed alarm symptoms with patient such as marked decrease in urine output or syncope which would warrant ED visit. Will follow up closely in 2 weeks. Discussed sliding scale diuretics with patient, but with CKD IIIa, h/o AKI and hypokalemia, will proceed cautiously and in a step wise fashion over the next several visits.   Disposition: f/u 2 weeks.   Current  medicines are reviewed at length with the patient today.  The patient did not have any concerns regarding medicines.  Signed, Lollie Marrow, PA-C  08/06/2020 1:38 PM     [address] [place] [address] [phone] (office)  [phone] (fax)

## 2020-08-06 NOTE — Patient Instructions (Signed)
Medication Instructions:  Your physician recommends that you continue on your current medications as directed. Please refer to the Current Medication list given to you today.  *If you need a refill on your cardiac medications before your next appointment, please call your pharmacy*   Lab Work: TODAY: BMET  If you have labs (blood work) drawn today and your tests are completely normal, you will receive your results only by: Marland Kitchen MyChart Message (if you have MyChart) OR . A paper copy in the mail If you have any lab test that is abnormal or we need to change your treatment, we will call you to review the results.   Follow-Up: At [facility], you and your health needs are our priority.  As part of our continuing mission to provide you with exceptional heart care, we have created designated Provider Care Teams.  These Care Teams include your primary Cardiologist (physician) and Advanced Practice Providers (APPs -  Physician Assistants and Nurse Practitioners) who all work together to provide you with the care you need, when you need it.  Your next appointment:   As scheduled  Other Instructions Weigh yourself first thing in the morning. On Sunday morning weigh again and if your weight is the same take an extra Metolazone and Potassium.

## 2020-08-07 LAB — BASIC METABOLIC PANEL
BUN/Creatinine Ratio: 28 (ref 12–28)
BUN: 41 mg/dL — ABNORMAL HIGH (ref 8–27)
CO2: 32 mmol/L — ABNORMAL HIGH (ref 20–29)
Calcium: 8.7 mg/dL (ref 8.7–10.3)
Chloride: 95 mmol/L — ABNORMAL LOW (ref 96–106)
Creatinine, Ser: 1.49 mg/dL — ABNORMAL HIGH (ref 0.57–1.00)
Glucose: 221 mg/dL — ABNORMAL HIGH (ref 65–99)
Potassium: 3.1 mmol/L — ABNORMAL LOW (ref 3.5–5.2)
Sodium: 144 mmol/L (ref 134–144)
eGFR: 36 mL/min/{1.73_m2} — ABNORMAL LOW (ref >59)

## 2020-08-08 DIAGNOSIS — I5032 Chronic diastolic (congestive) heart failure: Secondary | ICD-10-CM | POA: Diagnosis not present

## 2020-08-08 DIAGNOSIS — E11621 Type 2 diabetes mellitus with foot ulcer: Secondary | ICD-10-CM | POA: Diagnosis not present

## 2020-08-08 DIAGNOSIS — I5023 Acute on chronic systolic (congestive) heart failure: Secondary | ICD-10-CM | POA: Diagnosis not present

## 2020-08-08 DIAGNOSIS — L03115 Cellulitis of right lower limb: Secondary | ICD-10-CM | POA: Diagnosis not present

## 2020-08-08 DIAGNOSIS — I110 Hypertensive heart disease with heart failure: Secondary | ICD-10-CM | POA: Diagnosis not present

## 2020-08-08 DIAGNOSIS — L97401 Non-pressure chronic ulcer of unspecified heel and midfoot limited to breakdown of skin: Secondary | ICD-10-CM | POA: Diagnosis not present

## 2020-08-09 DIAGNOSIS — E11621 Type 2 diabetes mellitus with foot ulcer: Secondary | ICD-10-CM | POA: Diagnosis not present

## 2020-08-09 DIAGNOSIS — L97401 Non-pressure chronic ulcer of unspecified heel and midfoot limited to breakdown of skin: Secondary | ICD-10-CM | POA: Diagnosis not present

## 2020-08-09 DIAGNOSIS — I5023 Acute on chronic systolic (congestive) heart failure: Secondary | ICD-10-CM | POA: Diagnosis not present

## 2020-08-09 DIAGNOSIS — I5032 Chronic diastolic (congestive) heart failure: Secondary | ICD-10-CM | POA: Diagnosis not present

## 2020-08-09 DIAGNOSIS — L03115 Cellulitis of right lower limb: Secondary | ICD-10-CM | POA: Diagnosis not present

## 2020-08-09 DIAGNOSIS — I110 Hypertensive heart disease with heart failure: Secondary | ICD-10-CM | POA: Diagnosis not present

## 2020-08-12 DIAGNOSIS — Z4800 Encounter for change or removal of nonsurgical wound dressing: Secondary | ICD-10-CM | POA: Diagnosis not present

## 2020-08-12 DIAGNOSIS — E11621 Type 2 diabetes mellitus with foot ulcer: Secondary | ICD-10-CM | POA: Diagnosis not present

## 2020-08-12 DIAGNOSIS — L97412 Non-pressure chronic ulcer of right heel and midfoot with fat layer exposed: Secondary | ICD-10-CM | POA: Diagnosis not present

## 2020-08-12 DIAGNOSIS — I890 Lymphedema, not elsewhere classified: Secondary | ICD-10-CM | POA: Diagnosis not present

## 2020-08-12 DIAGNOSIS — Z794 Long term (current) use of insulin: Secondary | ICD-10-CM | POA: Diagnosis not present

## 2020-08-12 DIAGNOSIS — E1142 Type 2 diabetes mellitus with diabetic polyneuropathy: Secondary | ICD-10-CM | POA: Diagnosis not present

## 2020-08-19 ENCOUNTER — Encounter: Payer: Medicare Other | Admitting: Student

## 2020-08-19 DIAGNOSIS — I482 Chronic atrial fibrillation, unspecified: Secondary | ICD-10-CM | POA: Diagnosis not present

## 2020-08-19 DIAGNOSIS — I5032 Chronic diastolic (congestive) heart failure: Secondary | ICD-10-CM | POA: Diagnosis not present

## 2020-08-19 DIAGNOSIS — I700 Atherosclerosis of aorta: Secondary | ICD-10-CM | POA: Diagnosis not present

## 2020-08-19 DIAGNOSIS — E876 Hypokalemia: Secondary | ICD-10-CM | POA: Diagnosis not present

## 2020-08-21 ENCOUNTER — Other Ambulatory Visit: Payer: Self-pay

## 2020-08-21 ENCOUNTER — Encounter (HOSPITAL_COMMUNITY): Payer: Self-pay | Admitting: Internal Medicine

## 2020-08-21 ENCOUNTER — Ambulatory Visit (HOSPITAL_COMMUNITY)
Admission: RE | Admit: 2020-08-21 | Discharge: 2020-08-21 | Disposition: A | Discharge status: Home / Self Care | Payer: Medicare Other | Source: Ambulatory Visit | Attending: Internal Medicine | Admitting: Internal Medicine

## 2020-08-21 VITALS — BP 124/80 | HR 100 | Wt 241.2 lb

## 2020-08-21 DIAGNOSIS — Z9111 Patient's noncompliance with dietary regimen: Secondary | ICD-10-CM | POA: Diagnosis not present

## 2020-08-21 DIAGNOSIS — E1122 Type 2 diabetes mellitus with diabetic chronic kidney disease: Secondary | ICD-10-CM | POA: Diagnosis not present

## 2020-08-21 DIAGNOSIS — G8929 Other chronic pain: Secondary | ICD-10-CM | POA: Diagnosis not present

## 2020-08-21 DIAGNOSIS — I480 Paroxysmal atrial fibrillation: Secondary | ICD-10-CM

## 2020-08-21 DIAGNOSIS — I5022 Chronic systolic (congestive) heart failure: Secondary | ICD-10-CM | POA: Diagnosis not present

## 2020-08-21 DIAGNOSIS — I2510 Atherosclerotic heart disease of native coronary artery without angina pectoris: Secondary | ICD-10-CM | POA: Insufficient documentation

## 2020-08-21 DIAGNOSIS — M549 Dorsalgia, unspecified: Secondary | ICD-10-CM | POA: Insufficient documentation

## 2020-08-21 DIAGNOSIS — E1165 Type 2 diabetes mellitus with hyperglycemia: Secondary | ICD-10-CM | POA: Insufficient documentation

## 2020-08-21 DIAGNOSIS — I447 Left bundle-branch block, unspecified: Secondary | ICD-10-CM | POA: Diagnosis not present

## 2020-08-21 DIAGNOSIS — I130 Hypertensive heart and chronic kidney disease with heart failure and stage 1 through stage 4 chronic kidney disease, or unspecified chronic kidney disease: Secondary | ICD-10-CM | POA: Diagnosis not present

## 2020-08-21 DIAGNOSIS — Z6837 Body mass index (BMI) 37.0-37.9, adult: Secondary | ICD-10-CM | POA: Insufficient documentation

## 2020-08-21 DIAGNOSIS — Z7901 Long term (current) use of anticoagulants: Secondary | ICD-10-CM | POA: Insufficient documentation

## 2020-08-21 DIAGNOSIS — I428 Other cardiomyopathies: Secondary | ICD-10-CM | POA: Insufficient documentation

## 2020-08-21 DIAGNOSIS — Z8673 Personal history of transient ischemic attack (TIA), and cerebral infarction without residual deficits: Secondary | ICD-10-CM | POA: Diagnosis not present

## 2020-08-21 DIAGNOSIS — Z79899 Other long term (current) drug therapy: Secondary | ICD-10-CM | POA: Diagnosis not present

## 2020-08-21 DIAGNOSIS — N1832 Chronic kidney disease, stage 3b: Secondary | ICD-10-CM | POA: Diagnosis not present

## 2020-08-21 DIAGNOSIS — Z794 Long term (current) use of insulin: Secondary | ICD-10-CM | POA: Insufficient documentation

## 2020-08-21 DIAGNOSIS — I5023 Acute on chronic systolic (congestive) heart failure: Secondary | ICD-10-CM | POA: Diagnosis not present

## 2020-08-21 HISTORY — DX: Heart failure, unspecified: I50.9

## 2020-08-21 LAB — BASIC METABOLIC PANEL
Anion gap: 7 (ref 5–15)
BUN: 30 mg/dL — ABNORMAL HIGH (ref 8–23)
CO2: 32 mmol/L (ref 22–32)
Calcium: 9.5 mg/dL (ref 8.9–10.3)
Chloride: 98 mmol/L (ref 98–111)
Creatinine, Ser: 1.43 mg/dL — ABNORMAL HIGH (ref 0.44–1.00)
GFR, Estimated: 38 mL/min — ABNORMAL LOW (ref >60)
Glucose, Bld: 164 mg/dL — ABNORMAL HIGH (ref 70–99)
Potassium: 3.4 mmol/L — ABNORMAL LOW (ref 3.5–5.1)
Sodium: 137 mmol/L (ref 135–145)

## 2020-08-21 MED ORDER — POTASSIUM CHLORIDE CRYS ER 20 MEQ PO TBCR: 60.0000 meq | EXTENDED_RELEASE_TABLET | Freq: Two times a day (BID) | ORAL | 3 refills | Status: DC

## 2020-08-21 MED ORDER — TORSEMIDE 20 MG PO TABS: 80.0000 mg | ORAL_TABLET | Freq: Two times a day (BID) | ORAL | 11 refills | Status: DC

## 2020-08-21 NOTE — Progress Notes (Signed)
Cardiology Office Note Date:  08/21/2020  Patient ID:  Victoria Adams, Victoria Adams 15-May-1940, MRN [id number] PCP:  Caryl Bis, MD  Cardiologist:  Dr. Haroldine Laws Electrophysiologist: Dr. Caryl Comes   Chief Complaint:  CHF  History of Present Illness: Victoria Adams is a 80 y.o. female with history of morbid obesity HTN, DM, Afib (permanent), CAD, NICM, chronic CHF (systolic), LBBB, chronic back pain, stroke (hemorrhagic), CAD, lymphedema  Last seen by Dr. Caryl Comes 2018, discussed significant PVC burden though in the environment of normalized LVEF and diminished CRT pacing to mid 90's because of that, no intervention/particular treatment for them.  Most recently saw Dr. Haroldine Laws Jan 2022, LVEF by last echo remained improved 60-65%, volume status was good, low dose coreg was added, planned to refer to EP to evaluate reduced BP %  she was admitted to [facility] 07/14/20 for HF exacerbation and R foot wound after a cat scratch.  IV diuretics and IV antibiotics given.  Foot required bedside surgical I&D Home on 07/20/20 with plans for wound care clinic follow up Coreg stopped and started metoprolol  succ and started on losartan and ASA 325mg  daily I do not see that her home diuretics were changed (daughter confirmed) Blood Cx were neg x2 for 5 days She had some electrolyte imbalances and AKI during her stay TTE noted LVEF 55-60%, mild RV dilation normal RVEF, LA severely dilated, mod TR, PA 62mmg, RA severely dilated  Following with wound clinic for R foot wound, seems  At her visit 07/25/20 planned for compression wraps and home health to get started  Today, patient presents for 2 week follow up. She was seen by Dr. Haroldine Laws yesterday and diuretics adjusted. Hasn't noticed much of a difference yet, unsurprisingly.  Daughter repeats that she is not very active. There have been several back and forths with PCP and they are still waiting for PT to start at home. Otherwise she is doing well overall at rest. BiV  percentages remains somewhat low overall. 88% today.  Device information MDR CRT -D, implanted 08/2016   Past Medical History:  Diagnosis Date  . Allergic rhinitis   . 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 Atrial fibrillation ([place])   . CHF (congestive heart failure) ([place])   . Chronic low back pain    Secondary to DJD  . Chronic systolic heart failure (HCC)    a. echo (5/15):  mod LVH, EF 20-25%, diff HK, severe LAE, mild RAE  . Coronary atherosclerosis-non obstructive    LHC (5/15):  EF 40-45% global HK; long LAD 40-60%, ostial 1st major septal perf 80-90%, ostial CFX ?%, mid AVCFX extensive Ca2+, dist PDA 50%  . Depression   . Essential hypertension, benign   . Fatty liver disease, nonalcoholic   . GERD (gastroesophageal reflux disease)   . Iron deficiency anemia   . Mitral regurgitation   . NICM (nonischemic cardiomyopathy) ([place])    a. echo (5/15):  mod LVH, EF 20-25%, diff HK, severe LAE, mild RAE  . NSTEMI (non-ST elevated myocardial infarction) ([place]) may 2015   No CAD  . Obesity   . Osteopenia   . PONV (postoperative nausea and vomiting)   . Rosacea   . Type 2 diabetes mellitus ([place])   . Vitamin D deficiency     Past Surgical History:  Procedure Laterality Date  . ABDOMINAL HYSTERECTOMY    . APPENDECTOMY    . BREAST BIOPSY     RIGHT  . CARDIAC CATHETERIZATION  2010   LAD 50%, CFX 30%, RCA 40%; EF by echo 25-35%  . CATARACT EXTRACTION Bilateral   . CHOLECYSTECTOMY    . COLONOSCOPY WITH PROPOFOL N/A 12/20/2013   Procedure: COLONOSCOPY WITH PROPOFOL;  Surgeon: Milus Banister, MD;  Location: [facility];  Service: Endoscopy;  Laterality: N/A;  . EP IMPLANTABLE DEVICE N/A 04/16/2016   Procedure: BiV ICD Insertion CRT-D;  Surgeon: Deboraha Sprang, MD;  Location: [place] CV LAB;  Service: Cardiovascular;  Laterality: N/A;  . ESOPHAGOGASTRODUODENOSCOPY N/A 08/25/2013   Procedure: ESOPHAGOGASTRODUODENOSCOPY (EGD);  Surgeon: Milus Banister, MD;  Location: [place];   Service: Endoscopy;  Laterality: N/A;  . LEFT HEART CATHETERIZATION WITH CORONARY ANGIOGRAM N/A 08/24/2013   Procedure: LEFT HEART CATHETERIZATION WITH CORONARY ANGIOGRAM;  Surgeon: Leonie Man, MD;  Location: [facility] CATH LAB;  Service: Cardiovascular;  Laterality: N/A;  . LUMBAR PERCUTANEOUS PEDICLE SCREW 2 LEVEL N/A 02/15/2019   Procedure: Percutaneous Pedicle Screw Fixation from Thoracic Ten-Thoracic Twelve with Methylmethacrylate Screw Augmentation;  Surgeon: Earnie Larsson, MD;  Location: [place];  Service: Neurosurgery;  Laterality: N/A;  . ROTATOR CUFF REPAIR     RIGHT SHOULDER  . TONSILLECTOMY  yrs ago  . TOTAL ABDOMINAL HYSTERECTOMY W/ BILATERAL SALPINGOOPHORECTOMY     for dermoid tumor    Current Outpatient Medications  Medication Sig Dispense Refill  . ascorbic acid (VITAMIN C) 1000 MG tablet Take 1,000 mg by mouth daily.    . Calcium Carb-Cholecalciferol (CALCIUM 600 + D PO) Take 1 tablet by mouth 2 (two) times daily.     Marland Kitchen doxycycline (VIBRAMYCIN) 100 MG capsule Take 100-200 mg by mouth 2 (two) times daily as needed (rosacea).     Marland Kitchen ELIQUIS 5 MG TABS tablet TAKE (1) TABLET TWICE DAILY. 60 tablet 3  . ferrous sulfate 325 (65 FE) MG tablet TAKE 1 TABLET ONCE DAILY WITH BREAKFAST. 30 tablet 0  . gabapentin (NEURONTIN) 300 MG capsule Take 1 capsule (300 mg total) by mouth 3 (three) times daily. 90 capsule 0  . HYDROcodone-acetaminophen (NORCO) 10-325 MG tablet Take 1 tablet by mouth every 6 (six) hours as needed for moderate pain (To be taken 3 hours after Dilaudid, if Dilaudid is ineffective).     . insulin glargine (LANTUS) 100 UNIT/ML injection Inject 30 Units into the skin daily.    Marland Kitchen LORazepam (ATIVAN) 0.5 MG tablet Take 0.5 mg by mouth 3 (three) times daily as needed.    . Magnesium Oxide 400 MG CAPS Take 400 mg by mouth 2 (two) times daily.    . metolazone (ZAROXOLYN) 2.5 MG tablet TAKE 1 TABLET EVERY TUESDAY AND FRIDAY ALONG WITH THE 40 MEQ OF POTASSIUM. 6 tablet 3  . metoprolol  succinate (TOPROL-XL) 50 MG 24 hr tablet Take 50 mg by mouth daily.    Marland Kitchen omeprazole (PRILOSEC) 20 MG capsule Take 20 mg by mouth 2 (two) times daily before a meal.    . ondansetron (ZOFRAN ODT) 4 MG disintegrating tablet Take 1 tablet (4 mg total) by mouth every 8 (eight) hours as needed for nausea or vomiting. 20 tablet 0  . potassium chloride SA (KLOR-CON) 20 MEQ tablet Take 3 tablets (60 mEq total) by mouth 2 (two) times daily. Take an extra 2 tabs when you take Metolazone 180 tablet 3  . rosuvastatin (CRESTOR) 20 MG tablet TAKE 1 TABLET BY MOUTH ONCE DAILY. 30 tablet 11  . sertraline (ZOLOFT) 100 MG tablet Take 100 mg by mouth daily.    Marland Kitchen torsemide (DEMADEX)  20 MG tablet Take 4 tablets (80 mg total) by mouth 2 (two) times daily. 240 tablet 11  . zinc gluconate 50 MG tablet Take 1 tablet by mouth daily.     No current facility-administered medications for this visit.    Allergies:   Codeine and Tylox [oxycodone-acetaminophen]   Social History:  The patient  reports that she has never smoked. She has never used smokeless tobacco. She reports that she does not drink alcohol and does not use drugs.   Family History:  The patient's family history includes Asthma in her brother; Coronary artery disease in her father; Diabetes in her father; Lung cancer in her father and mother; Other in her grandchild.  Review of systems complete and found to be negative unless listed in HPI.    PHYSICAL EXAM:  VS:  There were no vitals taken for this visit. BMI: There is no height or weight on file to calculate BMI.   General: Well appearing. No resp difficulty. HEENT: Normal Neck: Supple. JVP 5-6. Carotids 2+ bilat; no bruits. No thyromegaly or nodule noted. Cor: PMI nondisplaced. RRR, No M/G/R noted Lungs: CTAB, normal effort. Abdomen: Soft, non-tender, non-distended, no HSM. No bruits or masses. +BS  Extremities: No cyanosis, clubbing, or rash. R and LLE no edema.  Neuro: Alert & orientedx3, cranial  nerves grossly intact. moves all 4 extremities w/o difficulty. Affect pleasant   EKG:  Is not performed  today.   Device interrogation Performed today. See PaceART report..   07/15/2020: TTE Summary  1. The left ventricle is normal in size with mildly increased wall  thickness.  2. The left ventricular systolic function is normal, LVEF is visually  estimated at 55-60%.  3. There is mild to moderate mitral valve regurgitation.  4. The left atrium is severely dilated in size.  5. The right ventricle is mildly dilated in size, with normal systolic  function.  6. There is moderate tricuspid regurgitation.  7. There is mild pulmonary hypertension, estimated pulmonary artery systolic  pressure is 45 mmHg.  8. The right atrium is severely dilated in size.     05/08/20: TTE IMPRESSIONS  1. Left ventricular ejection fraction, by estimation, is 55 to 60%. The  left ventricle has normal function. The left ventricle has no regional  wall motion abnormalities. The left ventricular internal cavity size was  mildly dilated. Left ventricular  diastolic parameters are indeterminate.  2. Right ventricular systolic function is normal. The right ventricular  size is normal. There is mildly elevated pulmonary artery systolic  pressure.  3. Left atrial size was mildly dilated.  4. The mitral valve is normal in structure. Trivial mitral valve  regurgitation. No evidence of mitral stenosis.  5. The aortic valve is tricuspid. Aortic valve regurgitation is not  visualized. Mild to moderate aortic valve sclerosis/calcification is  present, without any evidence of aortic stenosis.  6. The inferior vena cava is normal in size with greater than 50%  respiratory variability, suggesting right atrial pressure of 3 mmHg.    02/08/2019: LVEF 60-65% 06/15/16: LVEF 60-65% 2017 LVEF 20-25%  Recent Labs: 07/31/2020: NT-Pro BNP 2,985 08/21/2020: BUN 30; Creatinine, Ser 1.43; Potassium 3.4;  Sodium 137  No results found for requested labs within last 8760 hours.   Estimated Creatinine Clearance: 41.3 mL/min (A) (by C-G formula based on SCr of 1.43 mg/dL (H)).   Wt Readings from Last 3 Encounters:  08/21/20 241 lb 3.2 oz (109.4 kg)  08/06/20 247 lb (112 kg)  07/31/20  247 lb (112 kg)     Other studies reviewed: Additional studies/records reviewed today include: summarized above  ASSESSMENT AND PLAN:  1. CRT-D Stable device measurements today.  VP remains lower in setting of A/C CHF. Will continue to follow closely as she diureses.  BiV 88% today. Will continue to follow at high LRL for now.   2. NICM 3. Chronic CHF     LVEF remains improved/normalized     low effective CRT and base pacing % as bove Volume status remains elevated. HF team adjusted diuretics 5/13. Will make no further changes at this time.  Continue torsemide 80 mg BID am and 40 mg q pm Continue metolazone 2.5 mg Tue and Friday with extra potassium.  Will continue to encourage she seek a higher level of care (Prevoiusly has refused ALF)    4. CAD Moderate diffuse mid LAD disease of 40-60%. Septal perforator branch with ostial ~90% Denies anginal symptoms.   5. Permanent AFib CHA2DS2Vasc is 8, on eliquis, appropriately dose.       Disposition: RTC 2 months. Follow pacing on remotes.    Current medicines are reviewed at length with the patient today. The patient did not have any concerns regarding medicines.  Signed, Lollie Marrow, PA-C  08/21/2020 10:15 PM     [address] [address] [phone] (office)  [phone] (fax)

## 2020-08-21 NOTE — Patient Instructions (Signed)
Increase Torsemide to 80 mg (4 tabs) Twice daily   Increase Potassium to 60 meq (3 tabs) Twice daily, increase to 80 meq (4 tabs) Twice daily on days you take Metolazone  Labs done today, your results will be available in MyChart, we will contact you for abnormal readings.  Your physician recommends that you schedule a follow-up appointment in: 6 weeks  If you have any questions or concerns before your next appointment please send [place] a message through [place] or call our office at [phone].    TO LEAVE A MESSAGE FOR THE NURSE SELECT OPTION 2, PLEASE LEAVE A MESSAGE INCLUDING: . YOUR NAME . DATE OF BIRTH . CALL BACK NUMBER . REASON FOR CALL**this is important as we prioritize the call backs  [place] AS LONG AS YOU CALL BEFORE 4:00 PM  At the [facility], you and your health needs are our priority. As part of our continuing mission to provide you with exceptional heart care, we have created designated Provider Care Teams. These Care Teams include your primary Cardiologist (physician) and Advanced Practice Providers (APPs- Physician Assistants and Nurse Practitioners) who all work together to provide you with the care you need, when you need it.   You may see any of the following providers on your designated Care Team at your next follow up: Marland Kitchen Dr Glori Bickers . Dr Loralie Champagne . Dr Vickki Muff . Darrick Grinder, NP . Lyda Jester, [place] . Audry Riles, PharmD   Please be sure to bring in all your medications bottles to every appointment.

## 2020-08-21 NOTE — Progress Notes (Signed)
Patient ID: [name], female   DOB: 30-Jan-1941, 80 y.o.   MRN: [id number]     Advanced Heart Failure Clinic Note   PCP: Dr. Gar Ponto ([facility] in [place]) Primary Cardiologist: Dr. Domenic Polite EP: Dr Caryl Comes  HF: Dr. Haroldine Laws   HPI: ARLANDA SHIPLETT is a 80 y.o. female with a history of morbid obesity, HTN, chronic A-Fib, poorly-controlled DM2, CAD, chronic back pain, chronic systolic HF due to NICM s/p CTR-D 2018.   Admitted 10/20 after fall resulting in unstable compression fracture. Underwent T10-T12 posterior lateral fusion. Hospitalization prolonged. Discharged on 02/21/19.    Echo 1/22 EF 55-60%  Was in [facility] in 4/22 for cat bite on her foot. Got IV abx and also IV lasix.   Seen by EP for reduced BiV pacing effectiveness and PVCs.   Today she returns for HF follow up with her daughter. Says she remains dizzy from her stroke. Unable to get around much. Uses a walker at home. Family frustrated with her because she is unwilling to accept help or go to assisted living. Says she doesn;t do anything. Takes torsemide 80/40 and metolazone 2.5mg  tues and Friday. Drinking a lot of fluid. Denies CP, orthopnea or PND    Optivol:Fluid way up for last 2-3 months.  BIV pacing down to 85%  Activity level << 1 hr Personally reviewed   ECHO (08/2013) EF 20-25%, RV normal            (02/2014) EF 25-30%, RV normal           (09/2014) EF 20-25% RV mildly dilated.            (02/2016) EF 20-25% RV normal            (06/2016)  Echo 06/15/16 EF 60-65%  SH: Lives in [place] with son and daughter, no ETOH and does not smoke, retired  [place]: Mother deceased: CAD?, HTN, lung cancer, afib        Father deceased: DM2, lung cancer, HTN, CAD stents        3 daughters (1 has afib)        1 son: healthy and alive  Review of systems complete and found to be negative unless listed in HPI.    Past Medical History:  Diagnosis Date  . Allergic rhinitis   . 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 Atrial  fibrillation ([place])   . CHF (congestive heart failure) ([place])   . Chronic low back pain    Secondary to DJD  . Chronic systolic heart failure (HCC)    a. echo (5/15):  mod LVH, EF 20-25%, diff HK, severe LAE, mild RAE  . Coronary atherosclerosis-non obstructive    LHC (5/15):  EF 40-45% global HK; long LAD 40-60%, ostial 1st major septal perf 80-90%, ostial CFX ?%, mid AVCFX extensive Ca2+, dist PDA 50%  . Depression   . Essential hypertension, benign   . Fatty liver disease, nonalcoholic   . GERD (gastroesophageal reflux disease)   . Iron deficiency anemia   . Mitral regurgitation   . NICM (nonischemic cardiomyopathy) ([place])    a. echo (5/15):  mod LVH, EF 20-25%, diff HK, severe LAE, mild RAE  . NSTEMI (non-ST elevated myocardial infarction) ([place]) may 2015   No CAD  . Obesity   . Osteopenia   . PONV (postoperative nausea and vomiting)   . Rosacea   . Type 2 diabetes mellitus ([place])   . Vitamin D deficiency  Current Outpatient Medications  Medication Sig Dispense Refill  . ascorbic acid (VITAMIN C) 1000 MG tablet Take 1,000 mg by mouth daily.    . Calcium Carb-Cholecalciferol (CALCIUM 600 + D PO) Take 1 tablet by mouth 2 (two) times daily.     Marland Kitchen doxycycline (VIBRAMYCIN) 100 MG capsule Take 100-200 mg by mouth 2 (two) times daily as needed (rosacea).     Marland Kitchen ELIQUIS 5 MG TABS tablet TAKE (1) TABLET TWICE DAILY. 60 tablet 3  . ferrous sulfate 325 (65 FE) MG tablet TAKE 1 TABLET ONCE DAILY WITH BREAKFAST. 30 tablet 0  . gabapentin (NEURONTIN) 300 MG capsule Take 1 capsule (300 mg total) by mouth 3 (three) times daily. 90 capsule 0  . HYDROcodone-acetaminophen (NORCO) 10-325 MG tablet Take 1 tablet by mouth every 6 (six) hours as needed for moderate pain (To be taken 3 hours after Dilaudid, if Dilaudid is ineffective).     . insulin glargine (LANTUS) 100 UNIT/ML injection Inject 30 Units into the skin daily.    Marland Kitchen LORazepam (ATIVAN) 0.5 MG tablet Take 0.5 mg by mouth 3 (three) times  daily as needed.    . Magnesium Oxide 400 MG CAPS Take 400 mg by mouth 2 (two) times daily.    . metolazone (ZAROXOLYN) 2.5 MG tablet TAKE 1 TABLET EVERY TUESDAY AND FRIDAY ALONG WITH THE 40 MEQ OF POTASSIUM. 6 tablet 3  . metoprolol succinate (TOPROL-XL) 50 MG 24 hr tablet Take 50 mg by mouth daily.    Marland Kitchen omeprazole (PRILOSEC) 20 MG capsule Take 20 mg by mouth 2 (two) times daily before a meal.    . ondansetron (ZOFRAN ODT) 4 MG disintegrating tablet Take 1 tablet (4 mg total) by mouth every 8 (eight) hours as needed for nausea or vomiting. 20 tablet 0  . rosuvastatin (CRESTOR) 20 MG tablet TAKE 1 TABLET BY MOUTH ONCE DAILY. 30 tablet 11  . sertraline (ZOLOFT) 100 MG tablet Take 100 mg by mouth daily.    Marland Kitchen zinc gluconate 50 MG tablet Take 1 tablet by mouth daily.    . potassium chloride SA (KLOR-CON) 20 MEQ tablet Take 3 tablets (60 mEq total) by mouth 2 (two) times daily. Take an extra 2 tabs when you take Metolazone 180 tablet 3  . torsemide (DEMADEX) 20 MG tablet Take 4 tablets (80 mg total) by mouth 2 (two) times daily. 240 tablet 11   No current facility-administered medications for this encounter.   Vitals:   08/21/20 1516  BP: 124/80  Pulse: 100  SpO2: 96%  Weight: 109.4 kg (241 lb 3.2 oz)   Wt Readings from Last 3 Encounters:  08/21/20 109.4 kg (241 lb 3.2 oz)  08/06/20 112 kg (247 lb)  07/31/20 112 kg (247 lb)   PHYSICAL EXAM: General:  Obese woman sitting in WC. No resp difficulty HEENT: normal Neck: supple. JVP 10. Carotids 2+ bilat; no bruits. No lymphadenopathy or thryomegaly appreciated. Cor: PMI nondisplaced. Irregular rate & rhythm. No rubs, gallops or murmurs. Lungs: clear Abdomen: obese soft, nontender, nondistended. No hepatosplenomegaly. No bruits or masses. Good bowel sounds. Extremities: no cyanosis, clubbing, rash, 2-3+ edema  Dressing on R ankle Neuro: alert & orientedx3, cranial nerves grossly intact. moves all 4 extremities w/o difficulty. Affect  pleasant   ASSESSMENT & PLAN:  1) Acute/Chronic systolic HF with recovered EF:  - NICM, EF 25-30%, RV nl (02/2014).  ECHO 09/2014 EF 20-25%. ECHO 01/2016 EF 20-25%.  - s/p CRT-D 1/18 -> Echo 3/18 EF 60-65% EF recovered  with CRT-D - ECHO 01/2019 EF 60-65%.  - Echo 1/22 EF 55-60% RV ok  - Minimally active since her stroke. Refuses ALF.  - Volume status markedly elevated due to dietary noncompliance - Increase torsemide to 80 bid. Continue metolazone every Tues and Friday. Take potassium 60 bid on regular days 80 bid on metolazone days - Can consider SGLT2i but high concern for risk of UTIs given poor self care - Enroll in [facility] monitoring program with Sharman Cheek - Reinforced need for fluid restriction  2) CAD: Moderate diffuse mid LAD disease of 40-60%. Septal perforator branch with ostial ~90%.  - no s/s ischemia - Continue statin and bb.   3) CKD stage IIIb: - BMET today  4) Chronic Afib:  - Rate controlled.  - Continue eliquis 5 bid 5) LBBB-  - s/p CRT-D 1/18 - BIV pacing down to 85% EP following. Sees Dr. Caryl Comes tomorrow   Glori Bickers, MD  08/21/2020

## 2020-08-22 ENCOUNTER — Other Ambulatory Visit: Payer: Self-pay

## 2020-08-22 ENCOUNTER — Encounter: Payer: Self-pay | Admitting: Student

## 2020-08-22 ENCOUNTER — Ambulatory Visit (INDEPENDENT_AMBULATORY_CARE_PROVIDER_SITE_OTHER): Payer: Medicare Other | Admitting: Student

## 2020-08-22 ENCOUNTER — Other Ambulatory Visit: Payer: Self-pay | Admitting: *Deleted

## 2020-08-22 VITALS — BP 120/70 | HR 84 | Ht 67.0 in | Wt 245.6 lb

## 2020-08-22 DIAGNOSIS — I5023 Acute on chronic systolic (congestive) heart failure: Secondary | ICD-10-CM | POA: Diagnosis not present

## 2020-08-22 DIAGNOSIS — I428 Other cardiomyopathies: Secondary | ICD-10-CM

## 2020-08-22 DIAGNOSIS — I482 Chronic atrial fibrillation, unspecified: Secondary | ICD-10-CM | POA: Diagnosis not present

## 2020-08-22 DIAGNOSIS — I480 Paroxysmal atrial fibrillation: Secondary | ICD-10-CM

## 2020-08-22 DIAGNOSIS — L97401 Non-pressure chronic ulcer of unspecified heel and midfoot limited to breakdown of skin: Secondary | ICD-10-CM | POA: Diagnosis not present

## 2020-08-22 DIAGNOSIS — I447 Left bundle-branch block, unspecified: Secondary | ICD-10-CM | POA: Diagnosis not present

## 2020-08-22 DIAGNOSIS — L03115 Cellulitis of right lower limb: Secondary | ICD-10-CM | POA: Diagnosis not present

## 2020-08-22 DIAGNOSIS — E1151 Type 2 diabetes mellitus with diabetic peripheral angiopathy without gangrene: Secondary | ICD-10-CM | POA: Diagnosis not present

## 2020-08-22 DIAGNOSIS — I5032 Chronic diastolic (congestive) heart failure: Secondary | ICD-10-CM | POA: Diagnosis not present

## 2020-08-22 DIAGNOSIS — E11621 Type 2 diabetes mellitus with foot ulcer: Secondary | ICD-10-CM | POA: Diagnosis not present

## 2020-08-22 DIAGNOSIS — I2510 Atherosclerotic heart disease of native coronary artery without angina pectoris: Secondary | ICD-10-CM

## 2020-08-22 DIAGNOSIS — I110 Hypertensive heart disease with heart failure: Secondary | ICD-10-CM | POA: Diagnosis not present

## 2020-08-22 LAB — CUP PACEART INCLINIC DEVICE CHECK
Battery Remaining Longevity: 30 mo
Battery Voltage: 2.95 V
Brady Statistic AP VP Percent: 0 %
Brady Statistic AP VS Percent: 0 %
Brady Statistic AS VP Percent: 0 %
Brady Statistic AS VS Percent: 0 %
Brady Statistic RA Percent Paced: 0 %
Brady Statistic RV Percent Paced: 88 %
Date Time Interrogation Session: 20220513150936
HighPow Impedance: 72 Ohm
Implantable Lead Implant Date: 20180105
Implantable Lead Implant Date: 20180105
Implantable Lead Location: 753858
Implantable Lead Location: 753860
Implantable Lead Model: 4598
Implantable Pulse Generator Implant Date: 20180105
Lead Channel Impedance Value: 199.5 Ohm
Lead Channel Impedance Value: 199.5 Ohm
Lead Channel Impedance Value: 224.438
Lead Channel Impedance Value: 224.438
Lead Channel Impedance Value: 224.438
Lead Channel Impedance Value: 399 Ohm
Lead Channel Impedance Value: 399 Ohm
Lead Channel Impedance Value: 399 Ohm
Lead Channel Impedance Value: 4047 Ohm
Lead Channel Impedance Value: 456 Ohm
Lead Channel Impedance Value: 513 Ohm
Lead Channel Impedance Value: 532 Ohm
Lead Channel Impedance Value: 532 Ohm
Lead Channel Impedance Value: 589 Ohm
Lead Channel Impedance Value: 627 Ohm
Lead Channel Impedance Value: 836 Ohm
Lead Channel Impedance Value: 836 Ohm
Lead Channel Impedance Value: 855 Ohm
Lead Channel Pacing Threshold Amplitude: 0.375 V
Lead Channel Pacing Threshold Amplitude: 1 V
Lead Channel Pacing Threshold Pulse Width: 0.4 ms
Lead Channel Pacing Threshold Pulse Width: 0.8 ms
Lead Channel Sensing Intrinsic Amplitude: 13.125 mV
Lead Channel Setting Pacing Amplitude: 1.5 V
Lead Channel Setting Pacing Amplitude: 2 V
Lead Channel Setting Pacing Pulse Width: 0.4 ms
Lead Channel Setting Pacing Pulse Width: 0.8 ms
Lead Channel Setting Sensing Sensitivity: 0.3 mV

## 2020-08-22 NOTE — Patient Instructions (Signed)
Medication Instructions:  Your physician recommends that you continue on your current medications as directed. Please refer to the Current Medication list given to you today.  *If you need a refill on your cardiac medications before your next appointment, please call your pharmacy*   Lab Work: None ordered  If you have labs (blood work) drawn today and your tests are completely normal, you will receive your results only by: Marland Kitchen MyChart Message (if you have MyChart) OR . A paper copy in the mail If you have any lab test that is abnormal or we need to change your treatment, we will call you to review the results.   Testing/Procedures: None ordered   Follow-Up: At [facility], you and your health needs are our priority.  As part of our continuing mission to provide you with exceptional heart care, we have created designated Provider Care Teams.  These Care Teams include your primary Cardiologist (physician) and Advanced Practice Providers (APPs -  Physician Assistants and Nurse Practitioners) who all work together to provide you with the care you need, when you need it.  We recommend signing up for the patient portal called "MyChart".  Sign up information is provided on this After Visit Summary.  MyChart is used to connect with patients for Virtual Visits (Telemedicine).  Patients are able to view lab/test results, encounter notes, upcoming appointments, etc.  Non-urgent messages can be sent to your provider as well.   To learn more about what you can do with MyChart, go to [email or web address].    Your next appointment:   2 month(s)  The format for your next appointment:   In Person  Provider:   You will see one of the following Advanced Practice Providers on your designated Care Team:     Legrand Como "Jonni Sanger" [place], [place]

## 2020-08-22 NOTE — Patient Outreach (Signed)
[facility]) Care Management  08/22/2020  ELLAJANE STONG 08-18-1940 [id number]  Telephone outreach for referral from Dr. Gar Ponto requesting home visit for wound assessment and weakness. Called pt home and mobile, left messages advising of referral and availability of nurse to visit on Monday, May 16th. Will call again on Monday.  [name]. [name], MSN, [facility] Gerontological Nurse Practitioner [facility] Care Management [phone]

## 2020-08-25 ENCOUNTER — Other Ambulatory Visit: Payer: Self-pay | Admitting: *Deleted

## 2020-08-28 ENCOUNTER — Other Ambulatory Visit (HOSPITAL_COMMUNITY): Payer: Self-pay | Admitting: *Deleted

## 2020-08-28 MED ORDER — POTASSIUM CHLORIDE CRYS ER 20 MEQ PO TBCR: 60.0000 meq | EXTENDED_RELEASE_TABLET | Freq: Two times a day (BID) | ORAL | 3 refills | Status: DC

## 2020-08-28 MED ORDER — TORSEMIDE 20 MG PO TABS: 80.0000 mg | ORAL_TABLET | Freq: Two times a day (BID) | ORAL | 11 refills | Status: DC

## 2020-09-01 ENCOUNTER — Encounter: Payer: Self-pay | Admitting: *Deleted

## 2020-09-01 ENCOUNTER — Other Ambulatory Visit: Payer: Self-pay

## 2020-09-01 ENCOUNTER — Other Ambulatory Visit: Payer: Self-pay | Admitting: *Deleted

## 2020-09-01 NOTE — Patient Outreach (Signed)
[facility]) Care Management  [place]  09/01/2020   Victoria Adams 04/29/1940 [id number]  Subjective: Initial home visit per request by primary care giver, Victoria Ponto MD.  Objective: Nice home, somewhat cluttered. Multiple family members in attendance whom will not be there for long, everyone is moving out to their own homes. Victoria Adams will be alone with her son who is high functioning handicapped man. Victoria Adams is in her recliner/lift chair watching TV. She gets up to walk with walker to the bath room or to go into the kitchen to eat or to go to bed.  BP (!) 90/50 (BP Location: Right Arm)   Pulse 90 Comment: AFIB  Resp 18   Ht 1.676 m (5\' 6" )   Wt 228 lb (103.4 kg)   SpO2 94%   BMI 36.80 kg/m  FBS 156 (not known what date this was taken, dates on monitor have not been set up reflect actual dates.  Encounter Medications:  Outpatient Encounter Medications as of 09/01/2020  Medication Sig  . ascorbic acid (VITAMIN C) 1000 MG tablet Take 1,000 mg by mouth daily.  . Calcium Carb-Cholecalciferol (CALCIUM 600 + D PO) Take 1 tablet by mouth 2 (two) times daily.   Marland Kitchen ELIQUIS 5 MG TABS tablet TAKE (1) TABLET TWICE DAILY.  . ferrous sulfate 325 (65 FE) MG tablet TAKE 1 TABLET ONCE DAILY WITH BREAKFAST.  Marland Kitchen gabapentin (NEURONTIN) 300 MG capsule Take 1 capsule (300 mg total) by mouth 3 (three) times daily.  . insulin glargine (LANTUS) 100 UNIT/ML injection Inject 30 Units into the skin daily.  Marland Kitchen LORazepam (ATIVAN) 0.5 MG tablet Take 0.5 mg by mouth 3 (three) times daily as needed.  . Magnesium Oxide 400 MG CAPS Take 400 mg by mouth 2 (two) times daily.  . metolazone (ZAROXOLYN) 2.5 MG tablet TAKE 1 TABLET EVERY TUESDAY AND FRIDAY ALONG WITH THE 40 MEQ OF POTASSIUM.  Marland Kitchen metoprolol succinate (TOPROL-XL) 50 MG 24 hr tablet Take 50 mg by mouth daily.  Marland Kitchen omeprazole (PRILOSEC) 20 MG capsule Take 20 mg by mouth 2 (two) times daily before a meal.  . ondansetron (ZOFRAN  ODT) 4 MG disintegrating tablet Take 1 tablet (4 mg total) by mouth every 8 (eight) hours as needed for nausea or vomiting.  . potassium chloride SA (KLOR-CON) 20 MEQ tablet Take 3 tablets (60 mEq total) by mouth 2 (two) times daily. Take an extra 2 tabs when you take Metolazone  . rosuvastatin (CRESTOR) 20 MG tablet TAKE 1 TABLET BY MOUTH ONCE DAILY.  Marland Kitchen sertraline (ZOLOFT) 100 MG tablet Take 100 mg by mouth daily.  Marland Kitchen torsemide (DEMADEX) 20 MG tablet Take 4 tablets (80 mg total) by mouth 2 (two) times daily.  Marland Kitchen zinc gluconate 50 MG tablet Take 1 tablet by mouth daily.  Marland Kitchen doxycycline (VIBRAMYCIN) 100 MG capsule Take 100-200 mg by mouth 2 (two) times daily as needed (rosacea).  (Patient not taking: Reported on 09/01/2020)  . HYDROcodone-acetaminophen (NORCO) 10-325 MG tablet Take 1 tablet by mouth every 6 (six) hours as needed for moderate pain (To be taken 3 hours after Dilaudid, if Dilaudid is ineffective).  (Patient not taking: Reported on 09/01/2020)   No facility-administered encounter medications on file as of 09/01/2020.   Functional Status Survey: Is the patient deaf or have difficulty hearing?: No Does the patient have difficulty seeing, even when wearing glasses/contacts?: Yes Does the patient have difficulty concentrating, remembering, or making decisions?: Yes Does the patient have difficulty  walking or climbing stairs?: Yes Does the patient have difficulty dressing or bathing?: Yes Does the patient have difficulty doing errands alone such as visiting a doctor's office or shopping?: Yes  Fall/Depression Screening: Fall Risk  03/02/2019  Falls in the past year? 1  Comment Emmi Telephone Survey: data to providers prior to load  Number falls in past yr: 1  Comment Emmi Telephone Survey Actual Response = 3  Injury with Fall? 1   Fall Risk  09/01/2020 03/02/2019  Falls in the past year? 1 1  Comment - Emmi Telephone Survey: data to providers prior to load  Number falls in past yr: 1 1   Comment - Emmi Telephone Survey Actual Response = 3  Injury with Fall? 1 1  Risk for fall due to : Impaired balance/gait;History of fall(s);Impaired vision;Medication side effect -  Follow up Falls evaluation completed -   Assessment: NOT SAFE AT HOME INDEPENDENTLY - very high risk for falls due to low BP and instability of her feet, hx CVA, neuropathy                        HF                        DM  Goals Addressed            This Visit's Progress   . Find Help in My Community by calling and arranging tour of [facility] sometime within the next month, preferrably sooner.       Timeframe:  Short-Term Goal Priority:  High Start Date:      09/01/20                       Expected End Date:   6 /25/22                   Follow Up Date  09/09/20 - follow-up on any referrals for help I am given    Why is this important?    Knowing how and where to find help for yourself or family in your neighborhood and community is an important skill.   You will want to take some steps to learn how.    Notes: 08/31/20 Pt cannot stay in home alone without dire consequences, mainly a fall will be inevitable, nutrition, medication management, glucose monitoring.Marland KitchenMarland KitchenProvided name of [facility] and gave name of admissions coordination. Other option may be Hospice is she is determined to stay at home.    . Matintain My Quality of Life as evidencd by no serious fall or injury in home over the next 30 days.       Timeframe:  Short-Term Goal Priority:  High Start Date:   08/31/20                          Expected End Date:    10/09/20                   Follow Up Date 09/09/20   - check out options for in-home help, long-term care or hospice - complete a living will - discuss my treatment options with the doctor or nurse - make shared treatment decisions with doctor - name a health care proxy (decision maker)    Why is this important?    Having a long-term illness can be scary.   It can  also  be stressful for you and your caregiver.   These steps may help.    Notes: 08/31/20 Completed a MOST form. Pt does NOT want to be resuscitated.At this time pt is refusing to move to ALF despite recommendations for this. Her primary care provider is going to have reliquish all the things she has done for her mother and her mother must start completing her own basic care needs. This is highly unlikely and she is a very high risk for falls. NP estimates a serious fall/injury within a short time.  If pt does not come around to accepting ALF, will have to do an Adult Protective Referral. Also Hospice could be considered. Encouraged call precautions, use walker at all times. Have asked her to only weigh when someone is in the home with her for stand by assist. This task also is risky for her. Arise slowly and stand, allow dizziness to go away completely. Will ask the Heart Failure clinic to consider reducing her metoprolol to 25 mg daily. Will ask primary to reduce gabapentin. This NP Monique to stop am lorazepam. To reduce day time sleepiness and reduce chances for falls.     . Monitor and Manage My Blood Sugar-Diabetes Type 2       Timeframe:  Short-Term Goal Priority:  Low Start Date:   08/31/20                          Expected End Date:   10/09/20                    Follow Up Date 09/09/20   - check blood sugar at prescribed times: Once a week would be a start as evidenced by pt report.   Why is this important?    Checking your blood sugar at home helps to keep it from getting very high or very low.   Writing the results in a diary or log helps the doctor know how to care for you.   Your blood sugar log should have the time, date and the results.   Also, write down the amount of insulin or other medicine that you take.   Other information, like what you ate, exercise done and how you were feeling, will also be helpful.     Notes: 08/31/20 Had to look for glucose monitor. Pt is not  checking this on any schedule. Have asked her to check it at least once a week and not the time she took it or just always do it first thing in the am.      We agreed to talk again in one week. Pt to call me if needed or daughter  WILL REQUEST THAT MR. [place] XL TO 25 MG DAILY TO REDUCE FALL RISK.  Follow-up:  Patient agrees to Care Plan and Follow-up.   [name]. [name], MSN, [facility] Gerontological Nurse Practitioner [facility] Care Management [phone]

## 2020-09-02 ENCOUNTER — Encounter: Payer: Self-pay | Admitting: *Deleted

## 2020-09-03 ENCOUNTER — Encounter: Payer: Self-pay | Admitting: *Deleted

## 2020-09-03 ENCOUNTER — Telehealth (HOSPITAL_COMMUNITY): Payer: Self-pay | Admitting: Adult Health

## 2020-09-03 NOTE — Telephone Encounter (Signed)
Contacted regarding low bp by [facility] NP regarding SBP in the 90s.   I called and discussed with her daughter and recommended cutting back metoprolol to 25 mg at bed time.   Med change made. Will also follow up with Deloria Lair NP.   Geremy Rister NP_ C 2:34 PM

## 2020-09-03 NOTE — Patient Outreach (Signed)
[facility]) Care Management  09/03/2020  Victoria Adams 20-Feb-1941 [id number]  Care coordination with Amy Clegg NP, regarding pt's BP 90/50 and need for medication adjustment. She messaged me back stating she is decreasing pt's metoprolol to 25 XL at BEDTIME. She has already called Monique pt's daughter to notify her of the needed change.   I will be calling pt next week.  [name]. [name], MSN, [facility] Gerontological Nurse Practitioner [facility] Care Management [phone]

## 2020-09-04 ENCOUNTER — Other Ambulatory Visit: Payer: Self-pay | Admitting: *Deleted

## 2020-09-04 DIAGNOSIS — R6889 Other general symptoms and signs: Secondary | ICD-10-CM

## 2020-09-04 DIAGNOSIS — M79671 Pain in right foot: Secondary | ICD-10-CM

## 2020-09-09 ENCOUNTER — Other Ambulatory Visit: Payer: Self-pay

## 2020-09-09 ENCOUNTER — Other Ambulatory Visit: Payer: Self-pay | Admitting: *Deleted

## 2020-09-15 ENCOUNTER — Other Ambulatory Visit: Payer: Self-pay | Admitting: *Deleted

## 2020-09-15 NOTE — Patient Outreach (Signed)
[facility]) Care Management  09/15/2020  Victoria Adams [birth year] [id number]   Follow up telephone outreach to get update on pt's status. Home phone unanswered and voice mails says the person is not taking calls. Texted daughter to please call when she gets a chance so I can follow up on pt's goals.  [name]. [name], MSN, [facility] Gerontological Nurse Practitioner [facility] Care Management [phone]

## 2020-09-17 NOTE — Patient Outreach (Signed)
[facility]) Care Management  09/17/2020  Victoria Adams April 05, 1941 [id number]  Multiple calls made to daughter, Beckie Busing, to follow up on her mother's condition without success. Have left a message today 09/17/20 that I could come and visit on Monday afternoon if that is agreeable.   [name]. [name], MSN, [facility] Gerontological Nurse Practitioner [facility] Care Management [phone]

## 2020-09-19 DIAGNOSIS — J301 Allergic rhinitis due to pollen: Secondary | ICD-10-CM | POA: Diagnosis not present

## 2020-09-19 DIAGNOSIS — I25119 Atherosclerotic heart disease of native coronary artery with unspecified angina pectoris: Secondary | ICD-10-CM | POA: Diagnosis not present

## 2020-09-19 DIAGNOSIS — R1084 Generalized abdominal pain: Secondary | ICD-10-CM | POA: Diagnosis not present

## 2020-09-19 DIAGNOSIS — R300 Dysuria: Secondary | ICD-10-CM | POA: Diagnosis not present

## 2020-09-19 DIAGNOSIS — E1122 Type 2 diabetes mellitus with diabetic chronic kidney disease: Secondary | ICD-10-CM | POA: Diagnosis not present

## 2020-09-19 DIAGNOSIS — G6289 Other specified polyneuropathies: Secondary | ICD-10-CM | POA: Diagnosis not present

## 2020-09-19 DIAGNOSIS — Z79891 Long term (current) use of opiate analgesic: Secondary | ICD-10-CM | POA: Diagnosis not present

## 2020-09-19 DIAGNOSIS — F331 Major depressive disorder, recurrent, moderate: Secondary | ICD-10-CM | POA: Diagnosis not present

## 2020-09-19 DIAGNOSIS — Z1389 Encounter for screening for other disorder: Secondary | ICD-10-CM | POA: Diagnosis not present

## 2020-09-19 DIAGNOSIS — I482 Chronic atrial fibrillation, unspecified: Secondary | ICD-10-CM | POA: Diagnosis not present

## 2020-09-19 DIAGNOSIS — Z1331 Encounter for screening for depression: Secondary | ICD-10-CM | POA: Diagnosis not present

## 2020-09-19 DIAGNOSIS — E782 Mixed hyperlipidemia: Secondary | ICD-10-CM | POA: Diagnosis not present

## 2020-09-19 DIAGNOSIS — I5032 Chronic diastolic (congestive) heart failure: Secondary | ICD-10-CM | POA: Diagnosis not present

## 2020-09-19 DIAGNOSIS — K219 Gastro-esophageal reflux disease without esophagitis: Secondary | ICD-10-CM | POA: Diagnosis not present

## 2020-09-19 DIAGNOSIS — E7849 Other hyperlipidemia: Secondary | ICD-10-CM | POA: Diagnosis not present

## 2020-09-22 ENCOUNTER — Other Ambulatory Visit: Payer: Self-pay | Admitting: *Deleted

## 2020-09-22 ENCOUNTER — Other Ambulatory Visit: Payer: Self-pay

## 2020-09-22 NOTE — Patient Outreach (Signed)
[facility]) Care Management  [place]  09/22/2020   Victoria Adams 09-Jan-1941 [id number]  Subjective: Home visit. Upon arrival at 1:00 pm pt brother came to the car to announce his sister was sleeping and that I could not visit with her. He said she was up most of the night with diarrhea. NP explained we have an appointment and I have made the effort so I want to see her. He did finally agree. He welcomed me into their home and went to wake her up. She joined me in a few minutes. Came into the room walking well with the walker, normal gait.  We had a delightful conversation about her upbringing [place] farm with [place] electricity, cooking on a wood stove, Medical laboratory scientific officer, gardening.  She reports she has UTI and is on Bactrim DS started on 09/19/20. She reports having diarrhea. Her hemmorhoids are irritated and do bleed some and her rectum is very sore. She denies any orthostatic dizziness.  Objective: Resp 18   Wt 232 lb (105.2 kg)   SpO2 95%   BMI 37.45 kg/m   FBS 106 Sitting BP: 98/49,  Standing 90/40    Neuro Alert and oriented Heart: AFIB Lungs: clear Extremities: 3+ pedal edema up to 6 in above ankle. Skin: wound on R anterior forefoot has recently healed but skin appears very friable.                  Encounter Medications:  Outpatient Encounter Medications as of 09/22/2020  Medication Sig   ascorbic acid (VITAMIN C) 1000 MG tablet Take 1,000 mg by mouth daily.   Calcium Carb-Cholecalciferol (CALCIUM 600 + D PO) Take 1 tablet by mouth 2 (two) times daily.    doxycycline (VIBRAMYCIN) 100 MG capsule Take 100-200 mg by mouth 2 (two) times daily as needed (rosacea).  (Patient not taking: Reported on 09/01/2020)   ELIQUIS 5 MG TABS tablet TAKE (1) TABLET TWICE DAILY.   ferrous sulfate 325 (65 FE) MG tablet TAKE 1 TABLET ONCE DAILY WITH BREAKFAST.   gabapentin (NEURONTIN) 300 MG capsule Take 1 capsule (300 mg total) by mouth 3 (three) times daily.    HYDROcodone-acetaminophen (NORCO) 10-325 MG tablet Take 1 tablet by mouth every 6 (six) hours as needed for moderate pain (To be taken 3 hours after Dilaudid, if Dilaudid is ineffective).  (Patient not taking: Reported on 09/01/2020)   insulin glargine (LANTUS) 100 UNIT/ML injection Inject 30 Units into the skin daily.   LORazepam (ATIVAN) 0.5 MG tablet Take 0.5 mg by mouth 3 (three) times daily as needed.   Magnesium Oxide 400 MG CAPS Take 400 mg by mouth 2 (two) times daily.   metolazone (ZAROXOLYN) 2.5 MG tablet TAKE 1 TABLET EVERY TUESDAY AND FRIDAY ALONG WITH THE 40 MEQ OF POTASSIUM.   metoprolol succinate (TOPROL-XL) 50 MG 24 hr tablet Take 25 mg by mouth daily.   omeprazole (PRILOSEC) 20 MG capsule Take 20 mg by mouth 2 (two) times daily before a meal.   ondansetron (ZOFRAN ODT) 4 MG disintegrating tablet Take 1 tablet (4 mg total) by mouth every 8 (eight) hours as needed for nausea or vomiting.   potassium chloride SA (KLOR-CON) 20 MEQ tablet Take 3 tablets (60 mEq total) by mouth 2 (two) times daily. Take an extra 2 tabs when you take Metolazone   rosuvastatin (CRESTOR) 20 MG tablet TAKE 1 TABLET BY MOUTH ONCE DAILY.   sertraline (ZOLOFT) 100 MG tablet Take 100 mg by mouth daily.  torsemide (DEMADEX) 20 MG tablet Take 4 tablets (80 mg total) by mouth 2 (two) times daily.   zinc gluconate 50 MG tablet Take 1 tablet by mouth daily.   No facility-administered encounter medications on file as of 09/22/2020.   Functional Status:  In your present state of health, do you have any difficulty performing the following activities: 09/01/2020  Hearing? N  Vision? Y  Difficulty concentrating or making decisions? Y  Walking or climbing stairs? Y  Dressing or bathing? Y  Doing errands, shopping? Y  Preparing Food and eating ? N  Using the Toilet? N  In the past six months, have you accidently leaked urine? Y  Do you have problems with loss of bowel control? N  Managing your Medications? Y   Managing your Finances? Y  Housekeeping or managing your Housekeeping? Y  Some recent data might be hidden   Fall/Depression Screening: Fall Risk  09/01/2020 03/02/2019  Falls in the past year? 1 1  Comment - Emmi Telephone Survey: data to providers prior to load  Number falls in past yr: 1 1  Comment - Emmi Telephone Survey Actual Response = 3  Injury with Fall? 1 1  Risk for fall due to : Impaired balance/gait;History of fall(s);Impaired vision;Medication side effect -  Follow up Falls evaluation completed -   PHQ 2/9 Scores 09/01/2020  PHQ - 2 Score 6  PHQ- 9 Score 14   Assessment: HF stable                        HTN - low readings but unsymptomatic                        DM controlled                        High risk for falls but none reported!  Care Plan   Goals Addressed             This Visit's Progress    COMPLETED: Find Help in My Community by calling and arranging tour of [facility] sometime within the next month, preferrably sooner.       Timeframe:  Short-Term Goal Priority:  High Start Date:      09/01/20                       Expected End Date:   6 /25/22                   Follow Up Date  09/09/20 - follow-up on any referrals for help I am given    Why is this important?   Knowing how and where to find help for yourself or family in your neighborhood and community is an important skill.  You will want to take some steps to learn how.    Notes: 08/31/20 Pt cannot stay in home alone without dire consequences, mainly a fall will be inevitable, nutrition, medication management, glucose monitoring.Marland KitchenMarland KitchenProvided name of [facility] and gave name of admissions coordination. Other option may be Hospice is she is determined to stay at home. 09/22/20 Pt does not have outside private paid care, however, her mentally disabled brother is a big help. Surprisingly, she has not had any complications of chronic disease and has not had any falls!  There are no plans  for additional help at this time. Will close out this goal.  COMPLETED: Matintain My Quality of Life as evidencd by no serious fall or injury in home over the next 30 days.       Timeframe:  Short-Term Goal Priority:  High Start Date:   08/31/20                          Expected End Date:    10/09/20                   Follow Up Date 09/09/20   - check out options for in-home help, long-term care or hospice - complete a living will - discuss my treatment options with the doctor or nurse - make shared treatment decisions with doctor - name a health care proxy (decision maker)    Why is this important?   Having a long-term illness can be scary.  It can also be stressful for you and your caregiver.  These steps may help.    Notes: 08/31/20 Completed a MOST form. Pt does NOT want to be resuscitated.At this time pt is refusing to move to ALF despite recommendations for this. Her primary care provider is going to have reliquish all the things she has done for her mother and her mother must start completing her own basic care needs. This is highly unlikely and she is a very high risk for falls. NP estimates a serious fall/injury within a short time.  If pt does not come around to accepting ALF, will have to do an Adult Protective Referral. Also Hospice could be considered. Encouraged call precautions, use walker at all times. Have asked her to only weigh when someone is in the home with her for stand by assist. This task also is risky for her. Arise slowly and stand, allow dizziness to go away completely. Will ask the Heart Failure clinic to consider reducing her metoprolol to 25 mg daily. Will ask primary to reduce gabapentin. This NP Monique to stop am lorazepam. To reduce day time sleepiness and reduce chances for falls. 09/22/20 - No injuries or falls! Reinforced standing in place upon arising before walking anywhere to see if she becomes too dizzy to walk. Since Darrick Grinder NP reduced her metoprolol  last month she has not had any of these episodes.       Monitor and Manage My Blood Sugar-Diabetes Type 2       Timeframe:  Short-Term Goal Priority:  Low Start Date:   08/31/20                          Expected End Date:   10/09/20, extend to 11/08/20                   Follow Up Date  11/08/20   - check blood sugar at prescribed times: Once a week would be a start as evidenced by pt report.   Why is this important?   Checking your blood sugar at home helps to keep it from getting very high or very low.  Writing the results in a diary or log helps the doctor know how to care for you.  Your blood sugar log should have the time, date and the results.  Also, write down the amount of insulin or other medicine that you take.  Other information, like what you ate, exercise done and how you were feeling, will also be helpful.     Notes: 08/31/20 Had to  look for glucose monitor. Pt is not checking this on any schedule. Have asked her to check it at least once a week and not the time she took it or just always do it first thing in the am. 09/22/20 Unable to find the glucose meter that pt's daughter uses to check her glucose when she comes over which is only once a week now. FBS today was 106. Pt does not do this herself, daughter, Beckie Busing does this. Pt does inject her insulin every evening. Will see if Beckie Busing will provide other glucose readings. She has not answered my calls or texts this month.         Plan: Will see pt in one month unless [facility] initiates their care management program. Follow-up: Patient agrees to Care Plan and Follow-up. Follow-up in 1 month(s)  Tereza Gilham C. [name], MSN, [facility] Gerontological Nurse Practitioner [facility] Care Management [phone]

## 2020-09-29 ENCOUNTER — Ambulatory Visit: Payer: Medicare Other

## 2020-09-30 ENCOUNTER — Telehealth: Payer: Self-pay

## 2020-09-30 NOTE — Telephone Encounter (Signed)
Pt referred to [facility] by Dr Haroldine Laws.  Attempted call to daughter [name] and left message for return call.  Pt was previously in [facility] clinic but disenrolled due to unable to reach daughter for monthly follow up.

## 2020-10-05 DIAGNOSIS — R0902 Hypoxemia: Secondary | ICD-10-CM | POA: Diagnosis not present

## 2020-10-05 DIAGNOSIS — E871 Hypo-osmolality and hyponatremia: Secondary | ICD-10-CM | POA: Diagnosis not present

## 2020-10-05 DIAGNOSIS — N390 Urinary tract infection, site not specified: Secondary | ICD-10-CM | POA: Diagnosis not present

## 2020-10-05 DIAGNOSIS — Z20822 Contact with and (suspected) exposure to covid-19: Secondary | ICD-10-CM | POA: Diagnosis not present

## 2020-10-05 DIAGNOSIS — A0472 Enterocolitis due to Clostridium difficile, not specified as recurrent: Secondary | ICD-10-CM | POA: Diagnosis not present

## 2020-10-05 DIAGNOSIS — F331 Major depressive disorder, recurrent, moderate: Secondary | ICD-10-CM | POA: Diagnosis not present

## 2020-10-05 DIAGNOSIS — I499 Cardiac arrhythmia, unspecified: Secondary | ICD-10-CM | POA: Diagnosis not present

## 2020-10-05 DIAGNOSIS — R109 Unspecified abdominal pain: Secondary | ICD-10-CM | POA: Diagnosis not present

## 2020-10-05 DIAGNOSIS — I5023 Acute on chronic systolic (congestive) heart failure: Secondary | ICD-10-CM | POA: Diagnosis not present

## 2020-10-05 DIAGNOSIS — N12 Tubulo-interstitial nephritis, not specified as acute or chronic: Secondary | ICD-10-CM | POA: Diagnosis not present

## 2020-10-05 DIAGNOSIS — I4891 Unspecified atrial fibrillation: Secondary | ICD-10-CM | POA: Diagnosis not present

## 2020-10-05 DIAGNOSIS — Z9049 Acquired absence of other specified parts of digestive tract: Secondary | ICD-10-CM | POA: Diagnosis not present

## 2020-10-05 DIAGNOSIS — R52 Pain, unspecified: Secondary | ICD-10-CM | POA: Diagnosis not present

## 2020-10-05 DIAGNOSIS — R1110 Vomiting, unspecified: Secondary | ICD-10-CM | POA: Diagnosis not present

## 2020-10-05 DIAGNOSIS — I517 Cardiomegaly: Secondary | ICD-10-CM | POA: Diagnosis not present

## 2020-10-05 DIAGNOSIS — R112 Nausea with vomiting, unspecified: Secondary | ICD-10-CM | POA: Diagnosis not present

## 2020-10-05 DIAGNOSIS — R110 Nausea: Secondary | ICD-10-CM | POA: Diagnosis not present

## 2020-10-05 DIAGNOSIS — N10 Acute tubulo-interstitial nephritis: Secondary | ICD-10-CM | POA: Diagnosis not present

## 2020-10-05 DIAGNOSIS — R0602 Shortness of breath: Secondary | ICD-10-CM | POA: Diagnosis not present

## 2020-10-05 DIAGNOSIS — I130 Hypertensive heart and chronic kidney disease with heart failure and stage 1 through stage 4 chronic kidney disease, or unspecified chronic kidney disease: Secondary | ICD-10-CM | POA: Diagnosis not present

## 2020-10-05 DIAGNOSIS — I700 Atherosclerosis of aorta: Secondary | ICD-10-CM | POA: Diagnosis not present

## 2020-10-06 ENCOUNTER — Encounter (HOSPITAL_COMMUNITY): Payer: Medicare Other

## 2020-10-06 DIAGNOSIS — Z20822 Contact with and (suspected) exposure to covid-19: Secondary | ICD-10-CM | POA: Diagnosis present

## 2020-10-06 DIAGNOSIS — I4891 Unspecified atrial fibrillation: Secondary | ICD-10-CM | POA: Diagnosis present

## 2020-10-06 DIAGNOSIS — I5023 Acute on chronic systolic (congestive) heart failure: Secondary | ICD-10-CM | POA: Diagnosis present

## 2020-10-06 DIAGNOSIS — Z7901 Long term (current) use of anticoagulants: Secondary | ICD-10-CM | POA: Diagnosis not present

## 2020-10-06 DIAGNOSIS — N390 Urinary tract infection, site not specified: Secondary | ICD-10-CM | POA: Diagnosis present

## 2020-10-06 DIAGNOSIS — E782 Mixed hyperlipidemia: Secondary | ICD-10-CM | POA: Diagnosis present

## 2020-10-06 DIAGNOSIS — I2510 Atherosclerotic heart disease of native coronary artery without angina pectoris: Secondary | ICD-10-CM | POA: Diagnosis present

## 2020-10-06 DIAGNOSIS — Z794 Long term (current) use of insulin: Secondary | ICD-10-CM | POA: Diagnosis not present

## 2020-10-06 DIAGNOSIS — N183 Chronic kidney disease, stage 3 unspecified: Secondary | ICD-10-CM | POA: Diagnosis present

## 2020-10-06 DIAGNOSIS — B9620 Unspecified Escherichia coli [E. coli] as the cause of diseases classified elsewhere: Secondary | ICD-10-CM | POA: Diagnosis present

## 2020-10-06 DIAGNOSIS — F331 Major depressive disorder, recurrent, moderate: Secondary | ICD-10-CM | POA: Diagnosis present

## 2020-10-06 DIAGNOSIS — I25119 Atherosclerotic heart disease of native coronary artery with unspecified angina pectoris: Secondary | ICD-10-CM | POA: Diagnosis present

## 2020-10-06 DIAGNOSIS — E876 Hypokalemia: Secondary | ICD-10-CM | POA: Diagnosis present

## 2020-10-06 DIAGNOSIS — E1142 Type 2 diabetes mellitus with diabetic polyneuropathy: Secondary | ICD-10-CM | POA: Diagnosis present

## 2020-10-06 DIAGNOSIS — E1122 Type 2 diabetes mellitus with diabetic chronic kidney disease: Secondary | ICD-10-CM | POA: Diagnosis present

## 2020-10-06 DIAGNOSIS — Z885 Allergy status to narcotic agent status: Secondary | ICD-10-CM | POA: Diagnosis not present

## 2020-10-06 DIAGNOSIS — A0472 Enterocolitis due to Clostridium difficile, not specified as recurrent: Secondary | ICD-10-CM | POA: Diagnosis present

## 2020-10-06 DIAGNOSIS — I130 Hypertensive heart and chronic kidney disease with heart failure and stage 1 through stage 4 chronic kidney disease, or unspecified chronic kidney disease: Secondary | ICD-10-CM | POA: Diagnosis present

## 2020-10-06 DIAGNOSIS — E871 Hypo-osmolality and hyponatremia: Secondary | ICD-10-CM | POA: Diagnosis not present

## 2020-10-06 DIAGNOSIS — M10072 Idiopathic gout, left ankle and foot: Secondary | ICD-10-CM | POA: Diagnosis present

## 2020-10-06 DIAGNOSIS — Z6837 Body mass index (BMI) 37.0-37.9, adult: Secondary | ICD-10-CM | POA: Diagnosis not present

## 2020-10-06 DIAGNOSIS — N12 Tubulo-interstitial nephritis, not specified as acute or chronic: Secondary | ICD-10-CM | POA: Diagnosis not present

## 2020-10-06 DIAGNOSIS — Z28310 Unvaccinated for covid-19: Secondary | ICD-10-CM | POA: Diagnosis not present

## 2020-10-06 DIAGNOSIS — I700 Atherosclerosis of aorta: Secondary | ICD-10-CM | POA: Diagnosis present

## 2020-10-15 ENCOUNTER — Other Ambulatory Visit (HOSPITAL_COMMUNITY): Payer: Self-pay | Admitting: Vascular Surgery

## 2020-10-15 ENCOUNTER — Other Ambulatory Visit: Payer: Self-pay | Admitting: Vascular Surgery

## 2020-10-15 ENCOUNTER — Encounter: Payer: Medicare Other | Admitting: Vascular Surgery

## 2020-10-15 DIAGNOSIS — M5136 Other intervertebral disc degeneration, lumbar region: Secondary | ICD-10-CM | POA: Diagnosis not present

## 2020-10-15 DIAGNOSIS — I700 Atherosclerosis of aorta: Secondary | ICD-10-CM | POA: Diagnosis not present

## 2020-10-15 DIAGNOSIS — E1165 Type 2 diabetes mellitus with hyperglycemia: Secondary | ICD-10-CM | POA: Diagnosis not present

## 2020-10-15 DIAGNOSIS — M10372 Gout due to renal impairment, left ankle and foot: Secondary | ICD-10-CM | POA: Diagnosis not present

## 2020-10-15 DIAGNOSIS — K219 Gastro-esophageal reflux disease without esophagitis: Secondary | ICD-10-CM | POA: Diagnosis not present

## 2020-10-15 DIAGNOSIS — Z79891 Long term (current) use of opiate analgesic: Secondary | ICD-10-CM | POA: Diagnosis not present

## 2020-10-15 DIAGNOSIS — Z794 Long term (current) use of insulin: Secondary | ICD-10-CM | POA: Diagnosis not present

## 2020-10-15 DIAGNOSIS — N183 Chronic kidney disease, stage 3 unspecified: Secondary | ICD-10-CM | POA: Diagnosis not present

## 2020-10-15 DIAGNOSIS — L89152 Pressure ulcer of sacral region, stage 2: Secondary | ICD-10-CM | POA: Diagnosis not present

## 2020-10-15 DIAGNOSIS — N12 Tubulo-interstitial nephritis, not specified as acute or chronic: Secondary | ICD-10-CM | POA: Diagnosis not present

## 2020-10-15 DIAGNOSIS — I5023 Acute on chronic systolic (congestive) heart failure: Secondary | ICD-10-CM | POA: Diagnosis not present

## 2020-10-15 DIAGNOSIS — E782 Mixed hyperlipidemia: Secondary | ICD-10-CM | POA: Diagnosis not present

## 2020-10-15 DIAGNOSIS — Z7901 Long term (current) use of anticoagulants: Secondary | ICD-10-CM | POA: Diagnosis not present

## 2020-10-15 DIAGNOSIS — N390 Urinary tract infection, site not specified: Secondary | ICD-10-CM | POA: Diagnosis not present

## 2020-10-15 DIAGNOSIS — Z7409 Other reduced mobility: Secondary | ICD-10-CM | POA: Diagnosis not present

## 2020-10-15 DIAGNOSIS — I739 Peripheral vascular disease, unspecified: Secondary | ICD-10-CM

## 2020-10-15 DIAGNOSIS — F331 Major depressive disorder, recurrent, moderate: Secondary | ICD-10-CM | POA: Diagnosis not present

## 2020-10-15 DIAGNOSIS — Z6828 Body mass index (BMI) 28.0-28.9, adult: Secondary | ICD-10-CM | POA: Diagnosis not present

## 2020-10-15 DIAGNOSIS — E1122 Type 2 diabetes mellitus with diabetic chronic kidney disease: Secondary | ICD-10-CM | POA: Diagnosis not present

## 2020-10-15 DIAGNOSIS — E1142 Type 2 diabetes mellitus with diabetic polyneuropathy: Secondary | ICD-10-CM | POA: Diagnosis not present

## 2020-10-15 DIAGNOSIS — A0472 Enterocolitis due to Clostridium difficile, not specified as recurrent: Secondary | ICD-10-CM | POA: Diagnosis not present

## 2020-10-15 DIAGNOSIS — I2510 Atherosclerotic heart disease of native coronary artery without angina pectoris: Secondary | ICD-10-CM | POA: Diagnosis not present

## 2020-10-15 DIAGNOSIS — I4891 Unspecified atrial fibrillation: Secondary | ICD-10-CM | POA: Diagnosis not present

## 2020-10-15 DIAGNOSIS — I130 Hypertensive heart and chronic kidney disease with heart failure and stage 1 through stage 4 chronic kidney disease, or unspecified chronic kidney disease: Secondary | ICD-10-CM | POA: Diagnosis not present

## 2020-10-15 DIAGNOSIS — B9620 Unspecified Escherichia coli [E. coli] as the cause of diseases classified elsewhere: Secondary | ICD-10-CM | POA: Diagnosis not present

## 2020-10-17 ENCOUNTER — Encounter: Payer: Medicare Other | Admitting: Student

## 2020-10-20 ENCOUNTER — Encounter: Payer: Medicare Other | Admitting: Vascular Surgery

## 2020-10-20 DIAGNOSIS — N183 Chronic kidney disease, stage 3 unspecified: Secondary | ICD-10-CM | POA: Diagnosis not present

## 2020-10-20 DIAGNOSIS — I130 Hypertensive heart and chronic kidney disease with heart failure and stage 1 through stage 4 chronic kidney disease, or unspecified chronic kidney disease: Secondary | ICD-10-CM | POA: Diagnosis not present

## 2020-10-20 DIAGNOSIS — A0472 Enterocolitis due to Clostridium difficile, not specified as recurrent: Secondary | ICD-10-CM | POA: Diagnosis not present

## 2020-10-20 DIAGNOSIS — I5023 Acute on chronic systolic (congestive) heart failure: Secondary | ICD-10-CM | POA: Diagnosis not present

## 2020-10-20 DIAGNOSIS — E1122 Type 2 diabetes mellitus with diabetic chronic kidney disease: Secondary | ICD-10-CM | POA: Diagnosis not present

## 2020-10-20 DIAGNOSIS — I2510 Atherosclerotic heart disease of native coronary artery without angina pectoris: Secondary | ICD-10-CM | POA: Diagnosis not present

## 2020-10-21 DIAGNOSIS — N183 Chronic kidney disease, stage 3 unspecified: Secondary | ICD-10-CM | POA: Diagnosis not present

## 2020-10-21 DIAGNOSIS — E1122 Type 2 diabetes mellitus with diabetic chronic kidney disease: Secondary | ICD-10-CM | POA: Diagnosis not present

## 2020-10-21 DIAGNOSIS — I5023 Acute on chronic systolic (congestive) heart failure: Secondary | ICD-10-CM | POA: Diagnosis not present

## 2020-10-21 DIAGNOSIS — A0472 Enterocolitis due to Clostridium difficile, not specified as recurrent: Secondary | ICD-10-CM | POA: Diagnosis not present

## 2020-10-21 DIAGNOSIS — I2510 Atherosclerotic heart disease of native coronary artery without angina pectoris: Secondary | ICD-10-CM | POA: Diagnosis not present

## 2020-10-21 DIAGNOSIS — I130 Hypertensive heart and chronic kidney disease with heart failure and stage 1 through stage 4 chronic kidney disease, or unspecified chronic kidney disease: Secondary | ICD-10-CM | POA: Diagnosis not present

## 2020-10-22 DIAGNOSIS — I2510 Atherosclerotic heart disease of native coronary artery without angina pectoris: Secondary | ICD-10-CM | POA: Diagnosis not present

## 2020-10-22 DIAGNOSIS — I5023 Acute on chronic systolic (congestive) heart failure: Secondary | ICD-10-CM | POA: Diagnosis not present

## 2020-10-22 DIAGNOSIS — A0472 Enterocolitis due to Clostridium difficile, not specified as recurrent: Secondary | ICD-10-CM | POA: Diagnosis not present

## 2020-10-22 DIAGNOSIS — I130 Hypertensive heart and chronic kidney disease with heart failure and stage 1 through stage 4 chronic kidney disease, or unspecified chronic kidney disease: Secondary | ICD-10-CM | POA: Diagnosis not present

## 2020-10-22 DIAGNOSIS — E1122 Type 2 diabetes mellitus with diabetic chronic kidney disease: Secondary | ICD-10-CM | POA: Diagnosis not present

## 2020-10-22 DIAGNOSIS — N183 Chronic kidney disease, stage 3 unspecified: Secondary | ICD-10-CM | POA: Diagnosis not present

## 2020-10-23 DIAGNOSIS — I130 Hypertensive heart and chronic kidney disease with heart failure and stage 1 through stage 4 chronic kidney disease, or unspecified chronic kidney disease: Secondary | ICD-10-CM | POA: Diagnosis not present

## 2020-10-23 DIAGNOSIS — I5023 Acute on chronic systolic (congestive) heart failure: Secondary | ICD-10-CM | POA: Diagnosis not present

## 2020-10-23 DIAGNOSIS — E1122 Type 2 diabetes mellitus with diabetic chronic kidney disease: Secondary | ICD-10-CM | POA: Diagnosis not present

## 2020-10-23 DIAGNOSIS — N183 Chronic kidney disease, stage 3 unspecified: Secondary | ICD-10-CM | POA: Diagnosis not present

## 2020-10-23 DIAGNOSIS — A0472 Enterocolitis due to Clostridium difficile, not specified as recurrent: Secondary | ICD-10-CM | POA: Diagnosis not present

## 2020-10-23 DIAGNOSIS — I2510 Atherosclerotic heart disease of native coronary artery without angina pectoris: Secondary | ICD-10-CM | POA: Diagnosis not present

## 2020-10-24 DIAGNOSIS — I2510 Atherosclerotic heart disease of native coronary artery without angina pectoris: Secondary | ICD-10-CM | POA: Diagnosis not present

## 2020-10-24 DIAGNOSIS — N183 Chronic kidney disease, stage 3 unspecified: Secondary | ICD-10-CM | POA: Diagnosis not present

## 2020-10-24 DIAGNOSIS — I130 Hypertensive heart and chronic kidney disease with heart failure and stage 1 through stage 4 chronic kidney disease, or unspecified chronic kidney disease: Secondary | ICD-10-CM | POA: Diagnosis not present

## 2020-10-24 DIAGNOSIS — E1122 Type 2 diabetes mellitus with diabetic chronic kidney disease: Secondary | ICD-10-CM | POA: Diagnosis not present

## 2020-10-24 DIAGNOSIS — I5023 Acute on chronic systolic (congestive) heart failure: Secondary | ICD-10-CM | POA: Diagnosis not present

## 2020-10-24 DIAGNOSIS — A0472 Enterocolitis due to Clostridium difficile, not specified as recurrent: Secondary | ICD-10-CM | POA: Diagnosis not present

## 2020-10-27 ENCOUNTER — Other Ambulatory Visit: Payer: Self-pay | Admitting: *Deleted

## 2020-10-27 DIAGNOSIS — I5022 Chronic systolic (congestive) heart failure: Secondary | ICD-10-CM | POA: Diagnosis present

## 2020-10-27 DIAGNOSIS — Z20822 Contact with and (suspected) exposure to covid-19: Secondary | ICD-10-CM | POA: Diagnosis not present

## 2020-10-27 DIAGNOSIS — D631 Anemia in chronic kidney disease: Secondary | ICD-10-CM | POA: Diagnosis not present

## 2020-10-27 DIAGNOSIS — R578 Other shock: Secondary | ICD-10-CM | POA: Diagnosis present

## 2020-10-27 DIAGNOSIS — E1142 Type 2 diabetes mellitus with diabetic polyneuropathy: Secondary | ICD-10-CM | POA: Diagnosis present

## 2020-10-27 DIAGNOSIS — I890 Lymphedema, not elsewhere classified: Secondary | ICD-10-CM | POA: Diagnosis present

## 2020-10-27 DIAGNOSIS — I2510 Atherosclerotic heart disease of native coronary artery without angina pectoris: Secondary | ICD-10-CM | POA: Diagnosis not present

## 2020-10-27 DIAGNOSIS — R6521 Severe sepsis with septic shock: Secondary | ICD-10-CM | POA: Diagnosis not present

## 2020-10-27 DIAGNOSIS — E0822 Diabetes mellitus due to underlying condition with diabetic chronic kidney disease: Secondary | ICD-10-CM | POA: Diagnosis present

## 2020-10-27 DIAGNOSIS — E785 Hyperlipidemia, unspecified: Secondary | ICD-10-CM | POA: Diagnosis not present

## 2020-10-27 DIAGNOSIS — E871 Hypo-osmolality and hyponatremia: Secondary | ICD-10-CM | POA: Diagnosis not present

## 2020-10-27 DIAGNOSIS — R41841 Cognitive communication deficit: Secondary | ICD-10-CM | POA: Diagnosis present

## 2020-10-27 DIAGNOSIS — A0472 Enterocolitis due to Clostridium difficile, not specified as recurrent: Secondary | ICD-10-CM | POA: Diagnosis present

## 2020-10-27 DIAGNOSIS — E876 Hypokalemia: Secondary | ICD-10-CM | POA: Diagnosis not present

## 2020-10-27 DIAGNOSIS — R109 Unspecified abdominal pain: Secondary | ICD-10-CM | POA: Diagnosis not present

## 2020-10-27 DIAGNOSIS — R000 Tachycardia, unspecified: Secondary | ICD-10-CM | POA: Diagnosis not present

## 2020-10-27 DIAGNOSIS — F331 Major depressive disorder, recurrent, moderate: Secondary | ICD-10-CM | POA: Diagnosis not present

## 2020-10-27 DIAGNOSIS — K6389 Other specified diseases of intestine: Secondary | ICD-10-CM | POA: Diagnosis not present

## 2020-10-27 DIAGNOSIS — Z6841 Body Mass Index (BMI) 40.0 and over, adult: Secondary | ICD-10-CM | POA: Diagnosis not present

## 2020-10-27 DIAGNOSIS — I5023 Acute on chronic systolic (congestive) heart failure: Secondary | ICD-10-CM | POA: Diagnosis not present

## 2020-10-27 DIAGNOSIS — N179 Acute kidney failure, unspecified: Secondary | ICD-10-CM | POA: Diagnosis not present

## 2020-10-27 DIAGNOSIS — K529 Noninfective gastroenteritis and colitis, unspecified: Secondary | ICD-10-CM | POA: Diagnosis not present

## 2020-10-27 DIAGNOSIS — E1122 Type 2 diabetes mellitus with diabetic chronic kidney disease: Secondary | ICD-10-CM | POA: Diagnosis not present

## 2020-10-27 DIAGNOSIS — L89159 Pressure ulcer of sacral region, unspecified stage: Secondary | ICD-10-CM | POA: Diagnosis present

## 2020-10-27 DIAGNOSIS — R509 Fever, unspecified: Secondary | ICD-10-CM | POA: Diagnosis not present

## 2020-10-27 DIAGNOSIS — D649 Anemia, unspecified: Secondary | ICD-10-CM | POA: Diagnosis present

## 2020-10-27 DIAGNOSIS — E782 Mixed hyperlipidemia: Secondary | ICD-10-CM | POA: Diagnosis present

## 2020-10-27 DIAGNOSIS — R0902 Hypoxemia: Secondary | ICD-10-CM | POA: Diagnosis not present

## 2020-10-27 DIAGNOSIS — N390 Urinary tract infection, site not specified: Secondary | ICD-10-CM | POA: Diagnosis present

## 2020-10-27 DIAGNOSIS — I959 Hypotension, unspecified: Secondary | ICD-10-CM | POA: Diagnosis not present

## 2020-10-27 DIAGNOSIS — N3289 Other specified disorders of bladder: Secondary | ICD-10-CM | POA: Diagnosis not present

## 2020-10-27 DIAGNOSIS — J811 Chronic pulmonary edema: Secondary | ICD-10-CM | POA: Diagnosis not present

## 2020-10-27 DIAGNOSIS — I130 Hypertensive heart and chronic kidney disease with heart failure and stage 1 through stage 4 chronic kidney disease, or unspecified chronic kidney disease: Secondary | ICD-10-CM | POA: Diagnosis not present

## 2020-10-27 DIAGNOSIS — M6281 Muscle weakness (generalized): Secondary | ICD-10-CM | POA: Diagnosis present

## 2020-10-27 DIAGNOSIS — A419 Sepsis, unspecified organism: Secondary | ICD-10-CM | POA: Diagnosis not present

## 2020-10-27 DIAGNOSIS — F419 Anxiety disorder, unspecified: Secondary | ICD-10-CM | POA: Diagnosis present

## 2020-10-27 DIAGNOSIS — A0471 Enterocolitis due to Clostridium difficile, recurrent: Secondary | ICD-10-CM | POA: Diagnosis not present

## 2020-10-27 DIAGNOSIS — N183 Chronic kidney disease, stage 3 unspecified: Secondary | ICD-10-CM | POA: Diagnosis not present

## 2020-10-27 DIAGNOSIS — I482 Chronic atrial fibrillation, unspecified: Secondary | ICD-10-CM | POA: Diagnosis present

## 2020-10-27 DIAGNOSIS — K219 Gastro-esophageal reflux disease without esophagitis: Secondary | ICD-10-CM | POA: Diagnosis not present

## 2020-10-27 DIAGNOSIS — Z794 Long term (current) use of insulin: Secondary | ICD-10-CM | POA: Diagnosis not present

## 2020-10-27 DIAGNOSIS — F339 Major depressive disorder, recurrent, unspecified: Secondary | ICD-10-CM | POA: Diagnosis present

## 2020-10-27 DIAGNOSIS — E440 Moderate protein-calorie malnutrition: Secondary | ICD-10-CM | POA: Diagnosis not present

## 2020-10-27 DIAGNOSIS — I517 Cardiomegaly: Secondary | ICD-10-CM | POA: Diagnosis not present

## 2020-10-27 DIAGNOSIS — Z87440 Personal history of urinary (tract) infections: Secondary | ICD-10-CM | POA: Diagnosis not present

## 2020-10-27 DIAGNOSIS — I4891 Unspecified atrial fibrillation: Secondary | ICD-10-CM | POA: Diagnosis present

## 2020-10-27 DIAGNOSIS — R278 Other lack of coordination: Secondary | ICD-10-CM | POA: Diagnosis present

## 2020-10-27 DIAGNOSIS — E860 Dehydration: Secondary | ICD-10-CM | POA: Diagnosis not present

## 2020-10-27 DIAGNOSIS — R262 Difficulty in walking, not elsewhere classified: Secondary | ICD-10-CM | POA: Diagnosis present

## 2020-10-27 DIAGNOSIS — I700 Atherosclerosis of aorta: Secondary | ICD-10-CM | POA: Diagnosis present

## 2020-10-27 DIAGNOSIS — M5136 Other intervertebral disc degeneration, lumbar region: Secondary | ICD-10-CM | POA: Diagnosis present

## 2020-10-27 NOTE — Patient Outreach (Signed)
[facility]) Care Management  10/27/2020  LANYIAH BRIX 1940/08/27 [id number]  Home visit to evaluate DM and HF.  Pt's brother met met at the door stating his sister has just fallen. She is seated beside her bed. She reports she has been sick about 3 weeks with nausea, vomiting and diarrhea. She complains of dysuria and rectal pain from so much diarrhea.   PE Alert and oriented. Skin very pale, dry but warm. FBS: 116, Pulse: AFIB 150, Resp 18, BP 90/50  Attempted to get pt to get on her  hands and knees to possibly pull herself up on a chair. Everytime she moved she started dry heaving.  Called 911, Gave report to EMS team. They were able to move her into a sling, get her outside and onto a stretcher. They are taking her to [facility].  Will follow by Care Everywhere.  Called daughter, Trenise Turay and left a message for her to advise of her mother going to the hospital. Also called Dr. Quillian Quince and Dr. Leonia Corona office to advise them.  [name]. [name], MSN, [facility] Gerontological Nurse Practitioner [facility] Care Management [phone]

## 2020-11-05 DIAGNOSIS — R41841 Cognitive communication deficit: Secondary | ICD-10-CM | POA: Diagnosis present

## 2020-11-05 DIAGNOSIS — E440 Moderate protein-calorie malnutrition: Secondary | ICD-10-CM | POA: Diagnosis present

## 2020-11-05 DIAGNOSIS — Z23 Encounter for immunization: Secondary | ICD-10-CM | POA: Diagnosis not present

## 2020-11-05 DIAGNOSIS — N183 Chronic kidney disease, stage 3 unspecified: Secondary | ICD-10-CM | POA: Diagnosis present

## 2020-11-05 DIAGNOSIS — Z961 Presence of intraocular lens: Secondary | ICD-10-CM | POA: Diagnosis not present

## 2020-11-05 DIAGNOSIS — R262 Difficulty in walking, not elsewhere classified: Secondary | ICD-10-CM | POA: Diagnosis present

## 2020-11-05 DIAGNOSIS — G4720 Circadian rhythm sleep disorder, unspecified type: Secondary | ICD-10-CM | POA: Diagnosis not present

## 2020-11-05 DIAGNOSIS — D649 Anemia, unspecified: Secondary | ICD-10-CM | POA: Diagnosis present

## 2020-11-05 DIAGNOSIS — F411 Generalized anxiety disorder: Secondary | ICD-10-CM | POA: Diagnosis not present

## 2020-11-05 DIAGNOSIS — K219 Gastro-esophageal reflux disease without esophagitis: Secondary | ICD-10-CM | POA: Diagnosis present

## 2020-11-05 DIAGNOSIS — F419 Anxiety disorder, unspecified: Secondary | ICD-10-CM | POA: Diagnosis present

## 2020-11-05 DIAGNOSIS — I4891 Unspecified atrial fibrillation: Secondary | ICD-10-CM | POA: Diagnosis present

## 2020-11-05 DIAGNOSIS — I5023 Acute on chronic systolic (congestive) heart failure: Secondary | ICD-10-CM | POA: Diagnosis present

## 2020-11-05 DIAGNOSIS — F32A Depression, unspecified: Secondary | ICD-10-CM | POA: Diagnosis not present

## 2020-11-05 DIAGNOSIS — F339 Major depressive disorder, recurrent, unspecified: Secondary | ICD-10-CM | POA: Diagnosis present

## 2020-11-05 DIAGNOSIS — N189 Chronic kidney disease, unspecified: Secondary | ICD-10-CM | POA: Diagnosis not present

## 2020-11-05 DIAGNOSIS — I890 Lymphedema, not elsewhere classified: Secondary | ICD-10-CM | POA: Diagnosis present

## 2020-11-05 DIAGNOSIS — R278 Other lack of coordination: Secondary | ICD-10-CM | POA: Diagnosis present

## 2020-11-05 DIAGNOSIS — A0471 Enterocolitis due to Clostridium difficile, recurrent: Secondary | ICD-10-CM | POA: Diagnosis not present

## 2020-11-05 DIAGNOSIS — M109 Gout, unspecified: Secondary | ICD-10-CM | POA: Diagnosis not present

## 2020-11-05 DIAGNOSIS — H524 Presbyopia: Secondary | ICD-10-CM | POA: Diagnosis not present

## 2020-11-05 DIAGNOSIS — I700 Atherosclerosis of aorta: Secondary | ICD-10-CM | POA: Diagnosis present

## 2020-11-05 DIAGNOSIS — M5136 Other intervertebral disc degeneration, lumbar region: Secondary | ICD-10-CM | POA: Diagnosis present

## 2020-11-05 DIAGNOSIS — I5022 Chronic systolic (congestive) heart failure: Secondary | ICD-10-CM | POA: Diagnosis not present

## 2020-11-05 DIAGNOSIS — E785 Hyperlipidemia, unspecified: Secondary | ICD-10-CM | POA: Diagnosis not present

## 2020-11-05 DIAGNOSIS — F331 Major depressive disorder, recurrent, moderate: Secondary | ICD-10-CM | POA: Diagnosis not present

## 2020-11-05 DIAGNOSIS — A0472 Enterocolitis due to Clostridium difficile, not specified as recurrent: Secondary | ICD-10-CM | POA: Diagnosis present

## 2020-11-05 DIAGNOSIS — R578 Other shock: Secondary | ICD-10-CM | POA: Diagnosis present

## 2020-11-05 DIAGNOSIS — R609 Edema, unspecified: Secondary | ICD-10-CM | POA: Diagnosis not present

## 2020-11-05 DIAGNOSIS — I2510 Atherosclerotic heart disease of native coronary artery without angina pectoris: Secondary | ICD-10-CM | POA: Diagnosis present

## 2020-11-05 DIAGNOSIS — N179 Acute kidney failure, unspecified: Secondary | ICD-10-CM | POA: Diagnosis not present

## 2020-11-05 DIAGNOSIS — E0822 Diabetes mellitus due to underlying condition with diabetic chronic kidney disease: Secondary | ICD-10-CM | POA: Diagnosis present

## 2020-11-05 DIAGNOSIS — E113293 Type 2 diabetes mellitus with mild nonproliferative diabetic retinopathy without macular edema, bilateral: Secondary | ICD-10-CM | POA: Diagnosis not present

## 2020-11-05 DIAGNOSIS — H353132 Nonexudative age-related macular degeneration, bilateral, intermediate dry stage: Secondary | ICD-10-CM | POA: Diagnosis not present

## 2020-11-05 DIAGNOSIS — E782 Mixed hyperlipidemia: Secondary | ICD-10-CM | POA: Diagnosis present

## 2020-11-05 DIAGNOSIS — M6281 Muscle weakness (generalized): Secondary | ICD-10-CM | POA: Diagnosis present

## 2020-11-05 DIAGNOSIS — W19XXXA Unspecified fall, initial encounter: Secondary | ICD-10-CM | POA: Diagnosis not present

## 2020-11-05 DIAGNOSIS — E119 Type 2 diabetes mellitus without complications: Secondary | ICD-10-CM | POA: Diagnosis not present

## 2020-11-05 DIAGNOSIS — E1122 Type 2 diabetes mellitus with diabetic chronic kidney disease: Secondary | ICD-10-CM | POA: Diagnosis not present

## 2020-11-06 DIAGNOSIS — E119 Type 2 diabetes mellitus without complications: Secondary | ICD-10-CM | POA: Diagnosis not present

## 2020-11-06 DIAGNOSIS — A0472 Enterocolitis due to Clostridium difficile, not specified as recurrent: Secondary | ICD-10-CM | POA: Diagnosis not present

## 2020-11-06 DIAGNOSIS — E785 Hyperlipidemia, unspecified: Secondary | ICD-10-CM | POA: Diagnosis not present

## 2020-11-06 DIAGNOSIS — I4891 Unspecified atrial fibrillation: Secondary | ICD-10-CM | POA: Diagnosis not present

## 2020-11-06 DIAGNOSIS — N189 Chronic kidney disease, unspecified: Secondary | ICD-10-CM | POA: Diagnosis not present

## 2020-11-06 DIAGNOSIS — I5022 Chronic systolic (congestive) heart failure: Secondary | ICD-10-CM | POA: Diagnosis not present

## 2020-11-06 DIAGNOSIS — K219 Gastro-esophageal reflux disease without esophagitis: Secondary | ICD-10-CM | POA: Diagnosis not present

## 2020-11-06 DIAGNOSIS — F32A Depression, unspecified: Secondary | ICD-10-CM | POA: Diagnosis not present

## 2020-11-07 DIAGNOSIS — F32A Depression, unspecified: Secondary | ICD-10-CM | POA: Diagnosis not present

## 2020-11-12 DIAGNOSIS — F32A Depression, unspecified: Secondary | ICD-10-CM | POA: Diagnosis not present

## 2020-11-12 NOTE — Progress Notes (Deleted)
Electrophysiology Office Note Date: 11/12/2020  ID:  Victoria Adams, DOB 1940-05-12, MRN [id number]  PCP: Caryl Bis, MD Primary Cardiologist: Glori Bickers, MD Electrophysiologist: Virl Axe, MD   CC: Routine ICD follow-up  Victoria Adams is a 80 y.o. female seen today for Virl Axe, MD for routine electrophysiology followup.  Since {Blank single:19197::"last being seen in our clinic","discharge from hospital"} the patient reports doing ***.  she denies chest pain, palpitations, dyspnea, PND, orthopnea, nausea, vomiting, dizziness, syncope, edema, weight gain, or early satiety. {He/she (caps):30048} has not had ICD shocks.   Device History: MDR CRT -D, implanted 08/2016  Past Medical History:  Diagnosis Date   Allergic rhinitis    Arthritis    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 echo (5/15):  mod LVH, EF 20-25%, diff HK, severe LAE, mild RAE   Coronary atherosclerosis-non obstructive    LHC (5/15):  EF 40-45% global HK; long LAD 40-60%, ostial 1st major septal perf 80-90%, ostial CFX ?%, mid AVCFX extensive Ca2+, dist PDA 50%   Depression    Essential hypertension, benign    Fatty liver disease, nonalcoholic    GERD (gastroesophageal reflux disease)    Iron deficiency anemia    Mitral regurgitation    NICM (nonischemic cardiomyopathy) ([place])    a. echo (5/15):  mod LVH, EF 20-25%, diff HK, severe LAE, mild RAE   NSTEMI (non-ST elevated myocardial infarction) ([place]) may 2015   No CAD   Obesity    Osteopenia    PONV (postoperative nausea and vomiting)    Rosacea    Type 2 diabetes mellitus ([place])    Vitamin D deficiency    Past Surgical History:  Procedure Laterality Date   ABDOMINAL HYSTERECTOMY     APPENDECTOMY     BREAST BIOPSY     RIGHT   CARDIAC CATHETERIZATION  2010   LAD 50%, CFX 30%, RCA 40%; EF by echo  25-35%   CATARACT EXTRACTION Bilateral    CHOLECYSTECTOMY     COLONOSCOPY WITH PROPOFOL N/A 12/20/2013   Procedure: COLONOSCOPY WITH PROPOFOL;  Surgeon: Milus Banister, MD;  Location: [facility];  Service: Endoscopy;  Laterality: N/A;   EP IMPLANTABLE DEVICE N/A 04/16/2016   Procedure: BiV ICD Insertion CRT-D;  Surgeon: Deboraha Sprang, MD;  Location: [facility];  Service: Cardiovascular;  Laterality: N/A;   ESOPHAGOGASTRODUODENOSCOPY N/A 08/25/2013   Procedure: ESOPHAGOGASTRODUODENOSCOPY (EGD);  Surgeon: Milus Banister, MD;  Location: [facility];  Service: Endoscopy;  Laterality: N/A;   LEFT HEART CATHETERIZATION WITH CORONARY ANGIOGRAM N/A 08/24/2013   Procedure: LEFT HEART CATHETERIZATION WITH CORONARY ANGIOGRAM;  Surgeon: Leonie Man, MD;  Location: [facility] CATH LAB;  Service: Cardiovascular;  Laterality: N/A;   LUMBAR PERCUTANEOUS PEDICLE SCREW 2 LEVEL N/A 02/15/2019   Procedure: Percutaneous Pedicle Screw Fixation from Thoracic Ten-Thoracic Twelve with Methylmethacrylate Screw Augmentation;  Surgeon: Earnie Larsson, MD;  Location: [place];  Service: Neurosurgery;  Laterality: N/A;   ROTATOR CUFF REPAIR     RIGHT SHOULDER   TONSILLECTOMY  yrs ago   TOTAL ABDOMINAL HYSTERECTOMY W/ BILATERAL SALPINGOOPHORECTOMY     for dermoid tumor    Current Outpatient Medications  Medication Sig Dispense Refill   ascorbic acid (VITAMIN C) 1000 MG tablet Take 1,000 mg by mouth  daily.     Calcium Carb-Cholecalciferol (CALCIUM 600 + D PO) Take 1 tablet by mouth 2 (two) times daily.      doxycycline (VIBRAMYCIN) 100 MG capsule Take 100-200 mg by mouth 2 (two) times daily as needed (rosacea).  (Patient not taking: Reported on 09/01/2020)     ELIQUIS 5 MG TABS tablet TAKE (1) TABLET TWICE DAILY. 60 tablet 3   ferrous sulfate 325 (65 FE) MG tablet TAKE 1 TABLET ONCE DAILY WITH BREAKFAST. 30 tablet 0   gabapentin (NEURONTIN) 300 MG capsule Take 1 capsule (300 mg total) by mouth 3 (three) times daily. 90  capsule 0   HYDROcodone-acetaminophen (NORCO) 10-325 MG tablet Take 1 tablet by mouth every 6 (six) hours as needed for moderate pain (To be taken 3 hours after Dilaudid, if Dilaudid is ineffective).  (Patient not taking: Reported on 09/01/2020)     insulin glargine (LANTUS) 100 UNIT/ML injection Inject 30 Units into the skin daily.     LORazepam (ATIVAN) 0.5 MG tablet Take 0.5 mg by mouth 3 (three) times daily as needed.     Magnesium Oxide 400 MG CAPS Take 400 mg by mouth 2 (two) times daily.     metolazone (ZAROXOLYN) 2.5 MG tablet TAKE 1 TABLET EVERY TUESDAY AND FRIDAY ALONG WITH THE 40 MEQ OF POTASSIUM. 6 tablet 3   metoprolol succinate (TOPROL-XL) 50 MG 24 hr tablet Take 25 mg by mouth daily.     omeprazole (PRILOSEC) 20 MG capsule Take 20 mg by mouth 2 (two) times daily before a meal.     ondansetron (ZOFRAN ODT) 4 MG disintegrating tablet Take 1 tablet (4 mg total) by mouth every 8 (eight) hours as needed for nausea or vomiting. 20 tablet 0   potassium chloride SA (KLOR-CON) 20 MEQ tablet Take 3 tablets (60 mEq total) by mouth 2 (two) times daily. Take an extra 2 tabs when you take Metolazone 180 tablet 3   rosuvastatin (CRESTOR) 20 MG tablet TAKE 1 TABLET BY MOUTH ONCE DAILY. 30 tablet 11   sertraline (ZOLOFT) 100 MG tablet Take 100 mg by mouth daily.     torsemide (DEMADEX) 20 MG tablet Take 4 tablets (80 mg total) by mouth 2 (two) times daily. 240 tablet 11   zinc gluconate 50 MG tablet Take 1 tablet by mouth daily.     No current facility-administered medications for this visit.    Allergies:   Acetaminophen, Codeine, and Tylox [oxycodone-acetaminophen]   Social History: Social History   Socioeconomic History   Marital status: Divorced    Spouse name: Not on file   Number of children: 4   Years of education: 11 years   Highest education level: GED or equivalent  Occupational History   Not on file  Tobacco Use   Smoking status: Never   Smokeless tobacco: Never  Vaping Use    Vaping Use: Never used  Substance and Sexual Activity   Alcohol use: No   Drug use: No   Sexual activity: Not Currently  Other Topics Concern   Not on file  Social History Narrative   Lives in [place] with handicapped son. No live in assistance for her after 09/03/20.   Social Determinants of Health   Financial Resource Strain: Not on file  Food Insecurity: No Food Insecurity   Worried About Charity fundraiser in the Last Year: Never true   Ran Out of Food in the Last Year: Never true  Transportation Needs: Not on file  Physical Activity:  Not on file  Stress: Not on file  Social Connections: Not on file  Intimate Partner Violence: Not on file    Family History: Family History  Problem Relation Age of Onset   Lung cancer Father    Coronary artery disease Father    Diabetes Father    Lung cancer Mother    Asthma Brother    Other 68        Grandchild with tetralogy of Fallot and Hirschsprng disease    Review of Systems: All other systems reviewed and are otherwise negative except as noted above.   Physical Exam: There were no vitals filed for this visit.   GEN- The patient is well appearing, alert and oriented x 3 today.   HEENT: normocephalic, atraumatic; sclera clear, conjunctiva pink; hearing intact; oropharynx clear; neck supple, no JVP Lymph- no cervical lymphadenopathy Lungs- Clear to ausculation bilaterally, normal work of breathing.  No wheezes, rales, rhonchi Heart- Regular rate and rhythm, no murmurs, rubs or gallops, PMI not laterally displaced GI- soft, non-tender, non-distended, bowel sounds present, no hepatosplenomegaly Extremities- no clubbing or cyanosis. No edema; DP/PT/radial pulses 2+ bilaterally MS- no significant deformity or atrophy Skin- warm and dry, no rash or lesion; ICD pocket well healed Psych- euthymic mood, full affect Neuro- strength and sensation are intact  ICD interrogation- reviewed in detail today,  See PACEART  report  EKG:  EKG {ACTION; IS/IS [id number] ordered today. The ekg ordered today shows ***  Recent Labs: 07/31/2020: NT-Pro BNP 2,985 08/21/2020: BUN 30; Creatinine, Ser 1.43; Potassium 3.4; Sodium 137   Wt Readings from Last 3 Encounters:  09/22/20 232 lb (105.2 kg)  09/01/20 228 lb (103.4 kg)  08/22/20 245 lb 9.6 oz (111.4 kg)     Other studies Reviewed: Additional studies/ records that were reviewed today include: ***   Assessment and Plan:  1.  Chronic systolic dysfunction s/p {Blank single:19197::"Medtronic","St. Jude","[employer]","Biotronik"} {Blank single:19197::"***","single chamber ICD","dual chamber ICD","CRT-D","S-ICD"}  euvolemic today Stable on an appropriate medical regimen Normal ICD function See Pace Art report No changes today BiV *** today.    2. NICM 3. Chronic CHF LVEF 55-60% 04/2020  Volume status *** on exam today.  Continue torsemide 80 mg BID am and 40 mg q pm Continue metolazone 2.5 mg Tue and Friday with extra potassium.  Will continue to encourage she seek a higher level of care (Prevoiusly has refused ALF)    4. CAD Moderate diffuse mid LAD disease of 40-60%. Septal perforator branch with ostial ~90% Denies anginal symptoms.   5. Permanent AFib Continue eliquis for CHA2DS2Vasc of at least 8. Denies bleeding.   Current medicines are reviewed at length with the patient today.   The patient does not have concerns regarding her medicines.  The following changes were made today:  {NONE DEFAULTED:18576}  Labs/ tests ordered today include:  Labs from 11/05/20 reviewed.  K 3.9, Cr 1.05, Mg 1.7 Hgb 11.2, WBC 9.7  No orders of the defined types were placed in this encounter.    Disposition:   Follow up with Dr. Caryl Comes  3 months   Signed, Shirley Friar, PA-C  11/12/2020 1:30 PM  [place] [address] [place] [address] [phone] (office) [phone] (fax)

## 2020-11-13 ENCOUNTER — Encounter: Payer: Medicare Other | Admitting: Student

## 2020-11-13 DIAGNOSIS — N1831 Chronic kidney disease, stage 3a: Secondary | ICD-10-CM

## 2020-11-13 DIAGNOSIS — I447 Left bundle-branch block, unspecified: Secondary | ICD-10-CM

## 2020-11-13 DIAGNOSIS — Z79899 Other long term (current) drug therapy: Secondary | ICD-10-CM

## 2020-11-13 DIAGNOSIS — I480 Paroxysmal atrial fibrillation: Secondary | ICD-10-CM

## 2020-11-13 DIAGNOSIS — I5022 Chronic systolic (congestive) heart failure: Secondary | ICD-10-CM

## 2020-11-17 DIAGNOSIS — G4720 Circadian rhythm sleep disorder, unspecified type: Secondary | ICD-10-CM | POA: Diagnosis not present

## 2020-11-17 DIAGNOSIS — F411 Generalized anxiety disorder: Secondary | ICD-10-CM | POA: Diagnosis not present

## 2020-11-17 DIAGNOSIS — F331 Major depressive disorder, recurrent, moderate: Secondary | ICD-10-CM | POA: Diagnosis not present

## 2020-11-28 DIAGNOSIS — A0472 Enterocolitis due to Clostridium difficile, not specified as recurrent: Secondary | ICD-10-CM | POA: Diagnosis not present

## 2020-12-05 DIAGNOSIS — I4891 Unspecified atrial fibrillation: Secondary | ICD-10-CM | POA: Diagnosis not present

## 2020-12-05 DIAGNOSIS — A0472 Enterocolitis due to Clostridium difficile, not specified as recurrent: Secondary | ICD-10-CM | POA: Diagnosis not present

## 2020-12-05 DIAGNOSIS — D649 Anemia, unspecified: Secondary | ICD-10-CM | POA: Diagnosis not present

## 2020-12-05 DIAGNOSIS — I5022 Chronic systolic (congestive) heart failure: Secondary | ICD-10-CM | POA: Diagnosis not present

## 2020-12-05 DIAGNOSIS — E785 Hyperlipidemia, unspecified: Secondary | ICD-10-CM | POA: Diagnosis not present

## 2020-12-05 DIAGNOSIS — K219 Gastro-esophageal reflux disease without esophagitis: Secondary | ICD-10-CM | POA: Diagnosis not present

## 2020-12-05 DIAGNOSIS — N189 Chronic kidney disease, unspecified: Secondary | ICD-10-CM | POA: Diagnosis not present

## 2020-12-05 DIAGNOSIS — E119 Type 2 diabetes mellitus without complications: Secondary | ICD-10-CM | POA: Diagnosis not present

## 2020-12-09 ENCOUNTER — Encounter: Payer: Self-pay | Admitting: *Deleted

## 2020-12-09 NOTE — Patient Outreach (Signed)
[facility]) Care Management  12/09/2020  Victoria Adams [birth year] [id number]   [name] remains at [facility] in [place]. Gerrit Friends, LCSW, reports she is unsure if pt will transition to LTC or not. She has been there for > 1 month to date. Will close her case today but have asked Shanna to advise if she does discharge home so her case may be reopened.  [name]. [name], MSN, [facility] Gerontological Nurse Practitioner [facility] Care Management [phone]

## 2020-12-16 DIAGNOSIS — F331 Major depressive disorder, recurrent, moderate: Secondary | ICD-10-CM | POA: Diagnosis not present

## 2020-12-16 DIAGNOSIS — F411 Generalized anxiety disorder: Secondary | ICD-10-CM | POA: Diagnosis not present

## 2020-12-16 DIAGNOSIS — G4720 Circadian rhythm sleep disorder, unspecified type: Secondary | ICD-10-CM | POA: Diagnosis not present

## 2020-12-17 DIAGNOSIS — A0472 Enterocolitis due to Clostridium difficile, not specified as recurrent: Secondary | ICD-10-CM | POA: Diagnosis not present

## 2020-12-19 DIAGNOSIS — Z961 Presence of intraocular lens: Secondary | ICD-10-CM | POA: Diagnosis not present

## 2020-12-19 DIAGNOSIS — H353132 Nonexudative age-related macular degeneration, bilateral, intermediate dry stage: Secondary | ICD-10-CM | POA: Diagnosis not present

## 2020-12-19 DIAGNOSIS — H524 Presbyopia: Secondary | ICD-10-CM | POA: Diagnosis not present

## 2020-12-19 DIAGNOSIS — E113293 Type 2 diabetes mellitus with mild nonproliferative diabetic retinopathy without macular edema, bilateral: Secondary | ICD-10-CM | POA: Diagnosis not present

## 2020-12-22 DIAGNOSIS — R609 Edema, unspecified: Secondary | ICD-10-CM | POA: Diagnosis not present

## 2020-12-22 DIAGNOSIS — A0471 Enterocolitis due to Clostridium difficile, recurrent: Secondary | ICD-10-CM | POA: Diagnosis not present

## 2020-12-24 ENCOUNTER — Encounter: Payer: Self-pay | Admitting: Nurse Practitioner

## 2020-12-25 DIAGNOSIS — E119 Type 2 diabetes mellitus without complications: Secondary | ICD-10-CM | POA: Diagnosis not present

## 2020-12-25 DIAGNOSIS — M109 Gout, unspecified: Secondary | ICD-10-CM | POA: Diagnosis not present

## 2020-12-30 DIAGNOSIS — M109 Gout, unspecified: Secondary | ICD-10-CM | POA: Diagnosis not present

## 2021-01-06 DIAGNOSIS — F32A Depression, unspecified: Secondary | ICD-10-CM | POA: Diagnosis not present

## 2021-01-06 DIAGNOSIS — E119 Type 2 diabetes mellitus without complications: Secondary | ICD-10-CM | POA: Diagnosis not present

## 2021-01-06 DIAGNOSIS — A0472 Enterocolitis due to Clostridium difficile, not specified as recurrent: Secondary | ICD-10-CM | POA: Diagnosis not present

## 2021-01-06 DIAGNOSIS — N189 Chronic kidney disease, unspecified: Secondary | ICD-10-CM | POA: Diagnosis not present

## 2021-01-06 DIAGNOSIS — D649 Anemia, unspecified: Secondary | ICD-10-CM | POA: Diagnosis not present

## 2021-01-06 DIAGNOSIS — K219 Gastro-esophageal reflux disease without esophagitis: Secondary | ICD-10-CM | POA: Diagnosis not present

## 2021-01-06 DIAGNOSIS — I5022 Chronic systolic (congestive) heart failure: Secondary | ICD-10-CM | POA: Diagnosis not present

## 2021-01-12 DIAGNOSIS — W19XXXA Unspecified fall, initial encounter: Secondary | ICD-10-CM | POA: Diagnosis not present

## 2021-01-12 DIAGNOSIS — A0472 Enterocolitis due to Clostridium difficile, not specified as recurrent: Secondary | ICD-10-CM | POA: Diagnosis not present

## 2021-01-15 DIAGNOSIS — Z23 Encounter for immunization: Secondary | ICD-10-CM | POA: Diagnosis not present

## 2021-01-20 DIAGNOSIS — F331 Major depressive disorder, recurrent, moderate: Secondary | ICD-10-CM | POA: Diagnosis not present

## 2021-01-20 DIAGNOSIS — F411 Generalized anxiety disorder: Secondary | ICD-10-CM | POA: Diagnosis not present

## 2021-01-20 DIAGNOSIS — G4720 Circadian rhythm sleep disorder, unspecified type: Secondary | ICD-10-CM | POA: Diagnosis not present

## 2021-01-21 ENCOUNTER — Other Ambulatory Visit (INDEPENDENT_AMBULATORY_CARE_PROVIDER_SITE_OTHER): Payer: Medicare Other

## 2021-01-21 ENCOUNTER — Ambulatory Visit (INDEPENDENT_AMBULATORY_CARE_PROVIDER_SITE_OTHER): Payer: Medicare Other | Admitting: Nurse Practitioner

## 2021-01-21 ENCOUNTER — Encounter: Payer: Self-pay | Admitting: Nurse Practitioner

## 2021-01-21 VITALS — BP 110/60 | HR 100 | Ht 66.0 in | Wt 234.0 lb

## 2021-01-21 DIAGNOSIS — A0471 Enterocolitis due to Clostridium difficile, recurrent: Secondary | ICD-10-CM

## 2021-01-21 DIAGNOSIS — A0472 Enterocolitis due to Clostridium difficile, not specified as recurrent: Secondary | ICD-10-CM | POA: Diagnosis not present

## 2021-01-21 DIAGNOSIS — N189 Chronic kidney disease, unspecified: Secondary | ICD-10-CM | POA: Diagnosis not present

## 2021-01-21 DIAGNOSIS — E785 Hyperlipidemia, unspecified: Secondary | ICD-10-CM | POA: Diagnosis not present

## 2021-01-21 DIAGNOSIS — I5022 Chronic systolic (congestive) heart failure: Secondary | ICD-10-CM | POA: Diagnosis not present

## 2021-01-21 DIAGNOSIS — I4891 Unspecified atrial fibrillation: Secondary | ICD-10-CM | POA: Diagnosis not present

## 2021-01-21 DIAGNOSIS — F32A Depression, unspecified: Secondary | ICD-10-CM | POA: Diagnosis not present

## 2021-01-21 DIAGNOSIS — K219 Gastro-esophageal reflux disease without esophagitis: Secondary | ICD-10-CM | POA: Diagnosis not present

## 2021-01-21 DIAGNOSIS — E119 Type 2 diabetes mellitus without complications: Secondary | ICD-10-CM | POA: Diagnosis not present

## 2021-01-21 LAB — CBC WITH DIFFERENTIAL/PLATELET
Basophils Absolute: 0 10*3/uL (ref 0.0–0.1)
Basophils Relative: 0.7 % (ref 0.0–3.0)
Eosinophils Absolute: 0.3 10*3/uL (ref 0.0–0.7)
Eosinophils Relative: 4.1 % (ref 0.0–5.0)
HCT: 34.4 % — ABNORMAL LOW (ref 36.0–46.0)
Hemoglobin: 11 g/dL — ABNORMAL LOW (ref 12.0–15.0)
Lymphocytes Relative: 33 % (ref 12.0–46.0)
Lymphs Abs: 2.2 10*3/uL (ref 0.7–4.0)
MCHC: 32 g/dL (ref 30.0–36.0)
MCV: 91.9 fl (ref 78.0–100.0)
Monocytes Absolute: 0.9 10*3/uL (ref 0.1–1.0)
Monocytes Relative: 12.9 % — ABNORMAL HIGH (ref 3.0–12.0)
Neutro Abs: 3.3 10*3/uL (ref 1.4–7.7)
Neutrophils Relative %: 49.3 % (ref 43.0–77.0)
Platelets: 137 10*3/uL — ABNORMAL LOW (ref 150.0–400.0)
RBC: 3.75 Mil/uL — ABNORMAL LOW (ref 3.87–5.11)
RDW: 17.4 % — ABNORMAL HIGH (ref 11.5–15.5)
WBC: 6.8 10*3/uL (ref 4.0–10.5)

## 2021-01-21 LAB — COMPREHENSIVE METABOLIC PANEL
ALT: 23 U/L (ref 0–35)
AST: 23 U/L (ref 0–37)
Albumin: 3.1 g/dL — ABNORMAL LOW (ref 3.5–5.2)
Alkaline Phosphatase: 129 U/L — ABNORMAL HIGH (ref 39–117)
BUN: 19 mg/dL (ref 6–23)
CO2: 31 mEq/L (ref 19–32)
Calcium: 8.7 mg/dL (ref 8.4–10.5)
Chloride: 102 mEq/L (ref 96–112)
Creatinine, Ser: 1.13 mg/dL (ref 0.40–1.20)
GFR: 46.51 mL/min — ABNORMAL LOW (ref >60.00)
Glucose, Bld: 271 mg/dL — ABNORMAL HIGH (ref 70–99)
Potassium: 4 mEq/L (ref 3.5–5.1)
Sodium: 139 mEq/L (ref 135–145)
Total Bilirubin: 0.6 mg/dL (ref 0.2–1.2)
Total Protein: 6.3 g/dL (ref 6.0–8.3)

## 2021-01-21 NOTE — Progress Notes (Signed)
01/21/2021 Victoria Adams [id number] Feb 03, 1941   CHIEF COMPLAINT: Recurrent C. Diff   HISTORY OF PRESENT ILLNESS:  Victoria Adams is a 80 year old female with a past medical history of arthritis, gout, anxiety, depression, CAD, MI 6606, systolic CHF, nonischemic cardiomyopathy, atrial fibrillation, DM II, CKD, anemia, GERD, fatty liver, adenomatous colon polyps and C. Diff colitis. She presents to our office today as referred by Dr. Charna Elizabeth for further evaluation regarding recurrent diarrhea with concerns of persistent C. difficile infection. She is accompanied by Amy a medical transporter from [facility].  She received an antibiotic for a cat bite to her right foot 09/2020 which subsequently resulted in diarrhea with abdominal pain. She was admitted to the [facility] hospital 10/05/2020 and she tested positive for toxigenic C. Diff colitis 10/06/2020 which was treated with oral Vancomycin. Her diarrhea improved and she was discharged home on 10/11/2020 with directions to complete Vancomycin for another 7 days which was done.  Unfortunately her diarrhea and lower abdominal pain recurred and she was admitted to the hospital 10/27/2020 and she was diagnosed with C. difficile colitis in septic shock, AKI and Klebsiella UTI treated with Cephalexin.  Stool cultures were positive for toxigenic C. difficile.  She received oral Vancomycin and IV Flagyl for her C. Diff colitis. She had hypokalemia and hypomagnesemia which resolved after she received KCL and magnesium IV. Her clinical status eventually improved and she was discharge on oral Vancomycin to [facility] on 11/05/2020.  She reports having intermittent diarrhea then passes solid stools and the diarrhea recurs.  No bloody stools.  She has infrequent right and left lower abdominal pain.  She denies having any abdominal pain at this time.  I called the SNF and spoke to the nursing coordinator [facility]. She verified  the patient was admitted to their rehab facility on oral Vancomycin continuously since her admission 11/05/2020 which was gradually tapered off on 01/16/2021. The patient did not received Dificid during her time in rehab.   Bowel movement flowsheet from the SNF: 01/16/2021: Formed/normal stools x3 01/17/2021: Formed/normal stools x3 01/18/2021: Loose stool x2, formed stool x1 01/19/2021: Formed stool x2, loose stool x1 01/20/2021: Loose stool x2 01/21/2021 loose stool x1  No bloody stools were documented  She is on Miralax 17gm packet QD as verified on the SNF flow sheet  She has a history of colon polyps.  She underwent a colonoscopy by Dr. Ardis Hughs 12/20/2013 which identified 3 tubular adenomatous colon and rectal polyps.  She was advised to repeat a colonoscopy in 3 years which was not done.  No known family history of colorectal cancer.  She has 2 daughters and 1 son.  She does not have a POA.   Laboratory studies 11/05/2020:  WBC 9.7.  Hemoglobin 11.2.  MCV 89.8.  Platelet 242.  Magnesium 1.7.  Sodium 140.  Potassium 3.9.  BUN 8.  Creatinine 1.05.  GFR 54.  Calcium 8.3.  Glucose 91.  CTAP without contrast 10/27/2020: 1. Colonic wall thickening most pronounced from the distal  transverse colon through the rectum. Favor inflammatory or  infectious colitis.  2. Aortic Atherosclerosis (ICD10-I70.0). Coronary artery  atherosclerosis  CTAP without contrast 10/05/2020: Stomach/Bowel: No evidence of bowel obstruction. Stomach, large and  small bowel grossly unremarkable Diffuse aortic/arterial atherosclerosis.  Bladder wall appears mildly thickened. Recommend clinical  correlation to exclude cystitis.     Colonoscopy 12/20/2013 by Dr. Ardis Hughs: Three polyps were found, removed and sent to  pathology. There were internal and external hemorrhoids. The prep was fair, subcentimeter lesions may not have been visible. -3 year colonoscopy recall 1. Colon, polyp(s), descending - TUBULAR ADENOMA. NO  HIGH GRADE DYSPLASIA OR MALIGNANCY IDENTIFIED. (1.2 CM, ONE FRAGMENT) 2. Colon, polyp(s), sigmoid - TUBULAR ADENOMA. NO HIGH GRADE DYSPLASIA OR MALIGNANCY IDENTIFIED. (1.2 CM, ONE FRAGMENT) 3. Rectum, polyp(s) - TUBULAR ADENOMA. NO HIGH GRADE DYSPLASIA OR MALIGNANCY IDENTIFIED. (2.0 CM, ONE FRAGMENT).  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 echo (5/15):  mod LVH, EF 20-25%, diff HK, severe LAE, mild RAE   Coronary atherosclerosis-non obstructive    LHC (5/15):  EF 40-45% global HK; long LAD 40-60%, ostial 1st major septal perf 80-90%, ostial CFX ?%, mid AVCFX extensive Ca2+, dist PDA 50%   Depression    Essential hypertension, benign    Fatty liver disease, nonalcoholic    GERD (gastroesophageal reflux disease)    Iron deficiency anemia    Mitral regurgitation    NICM (nonischemic cardiomyopathy) ([place])    a. echo (5/15):  mod LVH, EF 20-25%, diff HK, severe LAE, mild RAE   NSTEMI (non-ST elevated myocardial infarction) ([place]) may 2015   No CAD   Obesity    Osteopenia    PONV (postoperative nausea and vomiting)    Rosacea    Type 2 diabetes mellitus ([place])    Vitamin D deficiency    Past Surgical History:  Procedure Laterality Date   ABDOMINAL HYSTERECTOMY     APPENDECTOMY     BREAST BIOPSY     RIGHT   CARDIAC CATHETERIZATION  2010   LAD 50%, CFX 30%, RCA 40%; EF by echo 25-35%   CATARACT EXTRACTION Bilateral    CHOLECYSTECTOMY     COLONOSCOPY WITH PROPOFOL N/A 12/20/2013   Procedure: COLONOSCOPY WITH PROPOFOL;  Surgeon: Milus Banister, MD;  [facility];  Service: Endoscopy;  Laterality: N/A;   EP IMPLANTABLE DEVICE N/A 04/16/2016   Procedure: BiV ICD Insertion CRT-D;  Surgeon: Deboraha Sprang, MD;  Location: [place] [facility]  LAB;  Service: Cardiovascular;  Laterality: N/A;   ESOPHAGOGASTRODUODENOSCOPY N/A 08/25/2013   Procedure: ESOPHAGOGASTRODUODENOSCOPY (EGD);  Surgeon: Milus Banister, MD;  Location: [place];  Service: Endoscopy;  Laterality: N/A;   LEFT HEART CATHETERIZATION WITH CORONARY ANGIOGRAM N/A 08/24/2013   Procedure: LEFT HEART CATHETERIZATION WITH CORONARY ANGIOGRAM;  Surgeon: Leonie Man, MD;  Location: [facility] CATH LAB;  Service: Cardiovascular;  Laterality: N/A;   LUMBAR PERCUTANEOUS PEDICLE SCREW 2 LEVEL N/A 02/15/2019   Procedure: Percutaneous Pedicle Screw Fixation from Thoracic Ten-Thoracic Twelve with Methylmethacrylate Screw Augmentation;  Surgeon: Earnie Larsson, MD;  Location: [place];  Service: Neurosurgery;  Laterality: N/A;   ROTATOR CUFF REPAIR     RIGHT SHOULDER   TONSILLECTOMY  yrs ago   TOTAL ABDOMINAL HYSTERECTOMY W/ BILATERAL SALPINGOOPHORECTOMY     for dermoid tumor   Social History: She is divorced.  She has 2 daughters and one son Jonni Sanger).  Non-smoker.  No alcohol use.  No drug use.  Family History: Father with history of heart disease, diabetes and lung cancer.  Mother with history of lung cancer.  Brother with asthma.  No  known family history of esophageal, gastric or colon cancer.   Allergies  Allergen Reactions   Acetaminophen    Codeine Nausea And Vomiting   Tylox [Oxycodone-Acetaminophen]     Vision closing in & heart palpitations.      Outpatient Encounter Medications as of 01/21/2021  Medication Sig   ascorbic acid (VITAMIN C) 1000 MG tablet Take 1,000 mg by mouth daily.   Calcium Carb-Cholecalciferol (CALCIUM 600 + D PO) Take 1 tablet by mouth 2 (two) times daily.    doxycycline (VIBRAMYCIN) 100 MG capsule Take 100-200 mg by mouth 2 (two) times daily as needed (rosacea).  (Patient not taking: Reported on 09/01/2020)   ELIQUIS 5 MG TABS tablet TAKE (1) TABLET TWICE DAILY.   ferrous sulfate 325 (65 FE) MG tablet TAKE 1 TABLET ONCE DAILY WITH BREAKFAST.   gabapentin  (NEURONTIN) 300 MG capsule Take 1 capsule (300 mg total) by mouth 3 (three) times daily.   HYDROcodone-acetaminophen (NORCO) 10-325 MG tablet Take 1 tablet by mouth every 6 (six) hours as needed for moderate pain (To be taken 3 hours after Dilaudid, if Dilaudid is ineffective).  (Patient not taking: Reported on 09/01/2020)   insulin glargine (LANTUS) 100 UNIT/ML injection Inject 30 Units into the skin daily.   LORazepam (ATIVAN) 0.5 MG tablet Take 0.5 mg by mouth 3 (three) times daily as needed.   Magnesium Oxide 400 MG CAPS Take 400 mg by mouth 2 (two) times daily.   metolazone (ZAROXOLYN) 2.5 MG tablet TAKE 1 TABLET EVERY TUESDAY AND FRIDAY ALONG WITH THE 40 MEQ OF POTASSIUM.   metoprolol succinate (TOPROL-XL) 50 MG 24 hr tablet Take 25 mg by mouth daily.   omeprazole (PRILOSEC) 20 MG capsule Take 20 mg by mouth 2 (two) times daily before a meal.   ondansetron (ZOFRAN ODT) 4 MG disintegrating tablet Take 1 tablet (4 mg total) by mouth every 8 (eight) hours as needed for nausea or vomiting.   potassium chloride SA (KLOR-CON) 20 MEQ tablet Take 3 tablets (60 mEq total) by mouth 2 (two) times daily. Take an extra 2 tabs when you take Metolazone   rosuvastatin (CRESTOR) 20 MG tablet TAKE 1 TABLET BY MOUTH ONCE DAILY.   sertraline (ZOLOFT) 100 MG tablet Take 100 mg by mouth daily.   torsemide (DEMADEX) 20 MG tablet Take 4 tablets (80 mg total) by mouth 2 (two) times daily.   zinc gluconate 50 MG tablet Take 1 tablet by mouth daily.   No facility-administered encounter medications on file as of 01/21/2021.   REVIEW OF SYSTEMS:  Gen: Denies fever, sweats or chills. No weight loss.  CV: Denies chest pain, palpitations or edema. Resp: Denies cough, shortness of breath of hemoptysis.  GI: See HPI.   GU : Denies urinary burning, blood in urine, increased urinary frequency or incontinence. MS: + Muscle  Derm: Denies rash, itchiness, skin lesions or unhealing ulcers. Psych: Denies depression, anxiety,  memory loss, suicidal ideation and confusion. Heme: Denies bruising, bleeding. Neuro:  Denies headaches, dizziness or paresthesias. Endo:  Denies any problems with DM, thyroid or adrenal function.  PHYSICAL EXAM: BP 110/60   Pulse 100   Ht 5\' 6"  (1.676 m)   Wt 234 lb (106.1 kg)   BMI 37.77 kg/m   General: 80 year old fatigued appearing female in no acute distress presents in a wheelchair. Head: Normocephalic and atraumatic. Eyes:  Sclerae non-icteric, conjunctive pink. Ears: Normal auditory acuity. Mouth: Dentition intact. No ulcers or lesions.  Neck: Supple, no lymphadenopathy or thyromegaly.  Lungs: Clear bilaterally to auscultation without wheezes, crackles or rhonchi. Heart: Regular rate and rhythm. No murmur, rub or gallop appreciated.  Abdomen: Soft, nontender, non distended. No masses. No hepatosplenomegaly. Normoactive bowel sounds x 4 quadrants.  Rectal: Deferred. Musculoskeletal: Symmetrical with no gross deformities. Skin: Warm and dry. No rash or lesions on visible extremities. Extremities: Lower extremities with 2+ edema. Neurological: Alert oriented x 4, no focal deficits.  Psychological:  Alert and cooperative. Normal mood and affect.  ASSESSMENT AND PLAN:  2) 80 year old female admitted to the hospital 10/27/2020 with C.diff colitis initially treated with IV Metronidazole and oral Vancomycin in the hospital setting then on oral vancomycin with a prolonged taper completed on 01/16/2021 with recurrent nonbloody diarrhea. Infrequent right and lower abdominal pain. No fever. CTAP without contrast 10/27/2020 identified colonic wall thickening from the distal transverse colon through the rectum. -Florastor probiotic one po bid -Stop Miralax (appears she is getting Miralax QD per SNF med sheet).  I called the SNF and spoke to nursing staff Karmen Bongo with my instructions to stop MiraLAX -C. Diff GDH and toxin A/B -At this point we need to determine if she has recurrent C. Diff  infection vs post infectious IBS vs loose stools from ongoing Miralax use -Low thresh hold to start Dificid 200mg  po bid x 10 days if she was worsening diarrhea/abdominal pain -Further recommendations to be determined after the above lab results reviewed  -Continue isolation at SNF until she is consistently passing a solid formed stool  -CBC, CMP  2) GERD, stable on Pantoprazole  -Consider changing PPI to H2 blocker if active C. Diff infection recurs   3) Anemia on Ferrous Sulfate 325mg  po QD. No overt GI bleeding. -CBC as ordered above   4) Atrial fibrillation on Eliquis   5) Systolic CHF  6) CAD  7) DM II  8) Recurrent UTI  9) History of tubular adenomatous colon polyps per colonoscopy in 2015, past due for colon polyp surveillance colonoscopy -No plans for a colonoscopy at this time, benefits do not outweigh the risks at this juncture -Follow-up in the office with Dr. Ardis Hughs in a few months and he can determine if any further colonoscopies warranted    CC:  Caryl Bis, MD

## 2021-01-21 NOTE — Patient Instructions (Signed)
If you are age 78 or older, your body mass index should be between 23-30. Your Body mass index is 37.77 kg/m. If this is out of the aforementioned range listed, please consider follow up with your Primary Care Provider.  LABS:  Lab work has been ordered for you today. Our lab is located in the basement. They will also give you the stool testing kit to take home. Press "B" on the elevator. The lab is located at the first door on the left as you exit the elevator.  HEALTHCARE LAWS AND MY CHART RESULTS: Due to recent changes in healthcare laws, you may see the results of your imaging and laboratory studies on MyChart before your provider has had a chance to review them.   We understand that in some cases there may be results that are confusing or concerning to you. Not all laboratory results come back in the same time frame and the provider may be waiting for multiple results in order to interpret others.  Please give [place] 48 hours in order for your provider to thoroughly review all the results before contacting the office for clarification of your results.    RECOMMENDATIONS: Florastor probiotic, take 1 twice a day. This is over the counter. We will await lab results. If C.Diff is positive we will treat with Dificid 200 MG 1 tablet twice a day for 10 days. We will not send this to the pharmacy unless the test is positive.  It was great seeing you today! Thank you for entrusting me with your care and choosing [facility].  Noralyn Pick, CRNP

## 2021-01-22 DIAGNOSIS — A0472 Enterocolitis due to Clostridium difficile, not specified as recurrent: Secondary | ICD-10-CM | POA: Insufficient documentation

## 2021-01-22 NOTE — Progress Notes (Signed)
I agree with the above note, plan 

## 2021-02-05 DIAGNOSIS — J449 Chronic obstructive pulmonary disease, unspecified: Secondary | ICD-10-CM | POA: Diagnosis not present

## 2021-02-05 DIAGNOSIS — A0471 Enterocolitis due to Clostridium difficile, recurrent: Secondary | ICD-10-CM | POA: Diagnosis not present

## 2021-02-05 DIAGNOSIS — I130 Hypertensive heart and chronic kidney disease with heart failure and stage 1 through stage 4 chronic kidney disease, or unspecified chronic kidney disease: Secondary | ICD-10-CM | POA: Diagnosis not present

## 2021-02-05 DIAGNOSIS — I5023 Acute on chronic systolic (congestive) heart failure: Secondary | ICD-10-CM | POA: Diagnosis not present

## 2021-02-05 DIAGNOSIS — E1122 Type 2 diabetes mellitus with diabetic chronic kidney disease: Secondary | ICD-10-CM | POA: Diagnosis not present

## 2021-02-05 DIAGNOSIS — N189 Chronic kidney disease, unspecified: Secondary | ICD-10-CM | POA: Diagnosis not present

## 2021-02-05 DIAGNOSIS — I4891 Unspecified atrial fibrillation: Secondary | ICD-10-CM | POA: Diagnosis not present

## 2021-02-05 DIAGNOSIS — I25111 Atherosclerotic heart disease of native coronary artery with angina pectoris with documented spasm: Secondary | ICD-10-CM | POA: Diagnosis not present

## 2021-02-08 ENCOUNTER — Telehealth: Payer: Self-pay | Admitting: Nurse Practitioner

## 2021-02-08 NOTE — Telephone Encounter (Signed)
Beth, refer to labs dated 01/21/2021 with lab notes 10/24 and 10/26 with attempt to contact patient for follow up.  Please attempt to call the patient's daughter again on Monday and schedule a follow-up appointment in the office soon as possible.  If you are able to verify if she is still having diarrhea or not that would be greatly appreciated. The SNF completed additional stool test, C. Diff molecular was positive  10.14.2022 which would be expected in setting or recurrent C. Diff.  BUN 17.3.  Creatinine 1.13.  Alk phos 160.  WBC 7.1.  Hemoglobin 10.5.

## 2021-02-09 ENCOUNTER — Ambulatory Visit (INDEPENDENT_AMBULATORY_CARE_PROVIDER_SITE_OTHER): Payer: Medicare Other

## 2021-02-09 DIAGNOSIS — I428 Other cardiomyopathies: Secondary | ICD-10-CM | POA: Diagnosis not present

## 2021-02-09 NOTE — Telephone Encounter (Signed)
Called the daughter, [name]. No answer. Left a message asking she call back . Left brief details explaining this phone call.

## 2021-02-10 ENCOUNTER — Telehealth: Payer: Self-pay

## 2021-02-10 DIAGNOSIS — E1122 Type 2 diabetes mellitus with diabetic chronic kidney disease: Secondary | ICD-10-CM | POA: Diagnosis not present

## 2021-02-10 DIAGNOSIS — Z6841 Body Mass Index (BMI) 40.0 and over, adult: Secondary | ICD-10-CM | POA: Diagnosis not present

## 2021-02-10 DIAGNOSIS — A0472 Enterocolitis due to Clostridium difficile, not specified as recurrent: Secondary | ICD-10-CM | POA: Diagnosis not present

## 2021-02-10 DIAGNOSIS — R4582 Worries: Secondary | ICD-10-CM | POA: Diagnosis not present

## 2021-02-10 DIAGNOSIS — I25119 Atherosclerotic heart disease of native coronary artery with unspecified angina pectoris: Secondary | ICD-10-CM | POA: Diagnosis not present

## 2021-02-10 DIAGNOSIS — I5032 Chronic diastolic (congestive) heart failure: Secondary | ICD-10-CM | POA: Diagnosis not present

## 2021-02-10 DIAGNOSIS — I482 Chronic atrial fibrillation, unspecified: Secondary | ICD-10-CM | POA: Diagnosis not present

## 2021-02-10 DIAGNOSIS — N1832 Chronic kidney disease, stage 3b: Secondary | ICD-10-CM | POA: Diagnosis not present

## 2021-02-10 DIAGNOSIS — I700 Atherosclerosis of aorta: Secondary | ICD-10-CM | POA: Diagnosis not present

## 2021-02-10 LAB — CUP PACEART REMOTE DEVICE CHECK
Battery Remaining Longevity: 25 mo
Battery Voltage: 2.94 V
Brady Statistic AP VP Percent: 0 %
Brady Statistic AP VS Percent: 0 %
Brady Statistic AS VP Percent: 0 %
Brady Statistic AS VS Percent: 0 %
Brady Statistic RA Percent Paced: 0 %
Brady Statistic RV Percent Paced: 79.9 %
Date Time Interrogation Session: 20221029221855
HighPow Impedance: 49 Ohm
Implantable Lead Implant Date: 20180105
Implantable Lead Implant Date: 20180105
Implantable Lead Location: 753858
Implantable Lead Location: 753860
Implantable Lead Model: 4598
Implantable Pulse Generator Implant Date: 20180105
Lead Channel Impedance Value: 152 Ohm
Lead Channel Impedance Value: 160.941
Lead Channel Impedance Value: 168.889
Lead Channel Impedance Value: 168.889
Lead Channel Impedance Value: 180 Ohm
Lead Channel Impedance Value: 304 Ohm
Lead Channel Impedance Value: 304 Ohm
Lead Channel Impedance Value: 342 Ohm
Lead Channel Impedance Value: 380 Ohm
Lead Channel Impedance Value: 399 Ohm
Lead Channel Impedance Value: 4047 Ohm
Lead Channel Impedance Value: 456 Ohm
Lead Channel Impedance Value: 456 Ohm
Lead Channel Impedance Value: 513 Ohm
Lead Channel Impedance Value: 532 Ohm
Lead Channel Impedance Value: 570 Ohm
Lead Channel Impedance Value: 589 Ohm
Lead Channel Impedance Value: 589 Ohm
Lead Channel Pacing Threshold Amplitude: 0.5 V
Lead Channel Pacing Threshold Amplitude: 0.875 V
Lead Channel Pacing Threshold Pulse Width: 0.4 ms
Lead Channel Pacing Threshold Pulse Width: 0.8 ms
Lead Channel Sensing Intrinsic Amplitude: 11.75 mV
Lead Channel Sensing Intrinsic Amplitude: 11.75 mV
Lead Channel Setting Pacing Amplitude: 1.5 V
Lead Channel Setting Pacing Amplitude: 2 V
Lead Channel Setting Pacing Pulse Width: 0.4 ms
Lead Channel Setting Pacing Pulse Width: 0.8 ms
Lead Channel Setting Sensing Sensitivity: 0.3 mV

## 2021-02-10 NOTE — Telephone Encounter (Signed)
Attempted phone call to pt and both daughters listed on DPR.  Pt's phone disconnected.  Voicemail messages left to contact RN at [phone] at the request of Dr Caryl Comes.

## 2021-02-11 ENCOUNTER — Other Ambulatory Visit: Payer: Self-pay

## 2021-02-11 DIAGNOSIS — R748 Abnormal levels of other serum enzymes: Secondary | ICD-10-CM

## 2021-02-11 DIAGNOSIS — D509 Iron deficiency anemia, unspecified: Secondary | ICD-10-CM

## 2021-02-11 NOTE — Telephone Encounter (Signed)
Mailed letter to the patient

## 2021-02-16 NOTE — Progress Notes (Signed)
Remote ICD transmission.   

## 2021-02-17 NOTE — Progress Notes (Deleted)
Cardiology Office Note Date:  02/17/2021  Patient ID:  Victoria Adams, DOB Aug 24, 1940, MRN [id number] PCP:  Pcp, No  Cardiologist:  Dr. Haroldine Laws Electrophysiologist: Dr. Caryl Comes   Chief Complaint:  CHF  History of Present Illness: Victoria Adams is a 80 y.o. female with history of morbid obesity HTN, DM, Afib (permanent), CAD, NICM, chronic CHF (systolic), LBBB, chronic back pain, stroke (hemorrhagic), CAD, lymphedema  Last seen by Dr. Caryl Comes 2018, discussed significant PVC burden though in the environment of normalized LVEF and diminished CRT pacing to mid 90's because of that, no intervention/particular treatment for them.  Most recently saw Dr. Haroldine Laws Jan 2022, LVEF by last echo remained improved 60-65%, volume status was good, low dose coreg was added, planned to refer to EP to evaluate reduced BP %  she was admitted to [facility]/14/20 for HF exacerbation and R foot wound after a cat scratch.  IV diuretics and IV antibiotics given.  Foot required bedside surgical I&D Home on 07/20/20 with plans for wound care clinic follow up [place] stopped and started metoprolol  succ and started on losartan and ASA 325mg  daily I do not see that her home diuretics were changed (daughter confirmed) Blood Cx were neg x2 for 5 days She had some electrolyte imbalances and AKI during her stay TTE noted LVEF 55-60%, mild RV dilation normal RVEF, LA severely dilated, mod TR, PA 33mmg, RA severely dilated  Following with wound clinic for R foot wound, seems  At her visit 07/25/20 planned for compression wraps and home health to get started  08/2020, patient presents for 2 week follow up. She was seen by Dr. Haroldine Laws yesterday and diuretics adjusted. Hasn't noticed much of a difference yet, unsurprisingly.  Daughter repeats that she is not very active. There have been several back and forths with PCP and they are still waiting for PT to start at home. Otherwise she is doing well overall at rest. BiV percentages  remains somewhat low overall. 88% today.  Today,   Device information MDR CRT -D, implanted 08/2016   Past Medical History:  Diagnosis Date   Allergic rhinitis    Arthritis    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 echo (5/15):  mod LVH, EF 20-25%, diff HK, severe LAE, mild RAE   Coronary atherosclerosis-non obstructive    LHC (5/15):  EF 40-45% global HK; long LAD 40-60%, ostial 1st major septal perf 80-90%, ostial CFX ?%, mid AVCFX extensive Ca2+, dist PDA 50%   Depression    Essential hypertension, benign    Fatty liver disease, nonalcoholic    GERD (gastroesophageal reflux disease)    Iron deficiency anemia    Mitral regurgitation    NICM (nonischemic cardiomyopathy) ([place])    a. echo (5/15):  mod LVH, EF 20-25%, diff HK, severe LAE, mild RAE   NSTEMI (non-ST elevated myocardial infarction) ([place]) may 2015   No CAD   Obesity    Osteopenia    PONV (postoperative nausea and vomiting)    Rosacea    Type 2 diabetes mellitus ([place])    Vitamin D deficiency     Past Surgical History:  Procedure Laterality Date   ABDOMINAL HYSTERECTOMY     APPENDECTOMY     BREAST BIOPSY     RIGHT   CARDIAC  CATHETERIZATION  2010   LAD 50%, CFX 30%, RCA 40%; EF by echo 25-35%   CATARACT EXTRACTION Bilateral    CHOLECYSTECTOMY     COLONOSCOPY WITH PROPOFOL N/A 12/20/2013   Procedure: COLONOSCOPY WITH PROPOFOL;  Surgeon: Milus Banister, MD;  Location: [facility] END[facility];  Service: Endoscopy;  Laterality: N/A;   EP IMPLANTABLE DEVICE N/A 04/16/2016   Procedure: BiV ICD Insertion CRT-D;  Surgeon: Deboraha Sprang, MD;  Location: [facility];  Service: Cardiovascular;  Laterality: N/A;   ESOPHAGOGASTRODUODENOSCOPY N/A 08/25/2013   Procedure: ESOPHAGOGASTRODUODENOSCOPY (EGD);  Surgeon: Milus Banister, MD;  Location: [facility];  Service: Endoscopy;  Laterality:  N/A;   LEFT HEART CATHETERIZATION WITH CORONARY ANGIOGRAM N/A 08/24/2013   Procedure: LEFT HEART CATHETERIZATION WITH CORONARY ANGIOGRAM;  Surgeon: Leonie Man, MD;  Location: [facility] CATH LAB;  Service: Cardiovascular;  Laterality: N/A;   LUMBAR PERCUTANEOUS PEDICLE SCREW 2 LEVEL N/A 02/15/2019   Procedure: Percutaneous Pedicle Screw Fixation from Thoracic Ten-Thoracic Twelve with Methylmethacrylate Screw Augmentation;  Surgeon: Earnie Larsson, MD;  Location: [facility] [facility];  Service: Neurosurgery;  Laterality: N/A;   ROTATOR CUFF REPAIR     RIGHT SHOULDER   TONSILLECTOMY  yrs ago   TOTAL ABDOMINAL HYSTERECTOMY W/ BILATERAL SALPINGOOPHORECTOMY     for dermoid tumor    Current Outpatient Medications  Medication Sig Dispense Refill   ascorbic acid (VITAMIN C) 1000 MG tablet Take 1,000 mg by mouth daily.     atorvastatin (LIPITOR) 80 MG tablet Take by mouth.     Calcium Carb-Cholecalciferol (CALCIUM 600 + D PO) Take 1 tablet by mouth 2 (two) times daily.      doxycycline (VIBRAMYCIN) 100 MG capsule Take 100-200 mg by mouth 2 (two) times daily as needed (rosacea).     DULoxetine (CYMBALTA) 60 MG capsule Take by mouth.     ELIQUIS 5 MG TABS tablet TAKE (1) TABLET TWICE DAILY. 60 tablet 3   HYDROcodone-acetaminophen (NORCO) 10-325 MG tablet Take 1 tablet by mouth every 6 (six) hours as needed for moderate pain (To be taken 3 hours after Dilaudid, if Dilaudid is ineffective).     Insulin Glargine (BASAGLAR KWIKPEN) 100 UNIT/ML Inject into the skin.     insulin glargine (LANTUS) 100 UNIT/ML injection Inject 30 Units into the skin daily.     LORazepam (ATIVAN) 0.5 MG tablet Take 0.5 mg by mouth 3 (three) times daily as needed.     Magnesium Oxide 400 MG CAPS Take 400 mg by mouth 2 (two) times daily.     metolazone (ZAROXOLYN) 2.5 MG tablet TAKE 1 TABLET EVERY TUESDAY AND FRIDAY ALONG WITH THE 40 MEQ OF POTASSIUM. 6 tablet 3   metoprolol succinate (TOPROL-XL) 50 MG 24 hr tablet Take 25 mg by mouth daily.      omeprazole (PRILOSEC) 20 MG capsule Take 20 mg by mouth 2 (two) times daily before a meal.     ondansetron (ZOFRAN ODT) 4 MG disintegrating tablet Take 1 tablet (4 mg total) by mouth every 8 (eight) hours as needed for nausea or vomiting. 20 tablet 0   pantoprazole (PROTONIX) 40 MG tablet Take 40 mg by mouth daily.     potassium chloride SA (KLOR-CON) 20 MEQ tablet Take 3 tablets (60 mEq total) by mouth 2 (two) times daily. Take an extra 2 tabs when you take Metolazone 180 tablet 3   rosuvastatin (CRESTOR) 20 MG tablet TAKE 1 TABLET BY MOUTH ONCE DAILY. 30 tablet 11   torsemide (DEMADEX) 20 MG tablet  Take 4 tablets (80 mg total) by mouth 2 (two) times daily. 240 tablet 11   zinc gluconate 50 MG tablet Take 1 tablet by mouth daily.     No current facility-administered medications for this visit.    Allergies:   Acetaminophen, Codeine, and Tylox [oxycodone-acetaminophen]   Social History:  The patient  reports that she has never smoked. She has never used smokeless tobacco. She reports that she does not drink alcohol and does not use drugs.   Family History:  The patient's family history includes Asthma in her brother; Coronary artery disease in her father; Diabetes in her father; Lung cancer in her father and mother; Other in her grandchild.  Review of systems complete and found to be negative unless listed in HPI.    PHYSICAL EXAM:  VS:  There were no vitals taken for this visit. BMI: There is no height or weight on file to calculate BMI.   General:  Well appearing. No resp difficulty. HEENT: Normal Neck: Supple. JVP 5-6. Carotids 2+ bilat; no bruits. No thyromegaly or nodule noted. Cor: PMI nondisplaced. RRR, No M/G/R noted Lungs: CTAB, normal effort. Abdomen: Soft, non-tender, non-distended, no HSM. No bruits or masses. +BS   Extremities: No cyanosis, clubbing, or rash. R and LLE no edema.  Neuro: Alert & orientedx3, cranial nerves grossly intact. moves all 4 extremities w/o  difficulty. Affect pleasant   EKG:  Is not performed  today.   Device interrogation Performed today. See PaceART report..   07/15/2020: TTE Summary    1. The left ventricle is normal in size with mildly increased wall  thickness.    2. The left ventricular systolic function is normal, LVEF is visually  estimated at 55-60%.    3. There is mild to moderate mitral valve regurgitation.    4. The left atrium is severely dilated in size.    5. The right ventricle is mildly dilated in size, with normal systolic  function.    6. There is moderate tricuspid regurgitation.    7. There is mild pulmonary hypertension, estimated pulmonary artery systolic  pressure is 45 mmHg.    8. The right atrium is severely dilated  in size.     05/08/20: TTE IMPRESSIONS   1. Left ventricular ejection fraction, by estimation, is 55 to 60%. The  left ventricle has normal function. The left ventricle has no regional  wall motion abnormalities. The left ventricular internal cavity size was  mildly dilated. Left ventricular  diastolic parameters are indeterminate.   2. Right ventricular systolic function is normal. The right ventricular  size is normal. There is mildly elevated pulmonary artery systolic  pressure.   3. Left atrial size was mildly dilated.   4. The mitral valve is normal in structure. Trivial mitral valve  regurgitation. No evidence of mitral stenosis.   5. The aortic valve is tricuspid. Aortic valve regurgitation is not  visualized. Mild to moderate aortic valve sclerosis/calcification is  present, without any evidence of aortic stenosis.   6. The inferior vena cava is normal in size with greater than 50%  respiratory variability, suggesting right atrial pressure of 3 mmHg.    02/08/2019: LVEF 60-65% 06/15/16: LVEF 60-65% 2017 LVEF 20-25%  Recent Labs: 07/31/2020: NT-Pro BNP 2,985 01/21/2021: ALT 23; BUN 19; Creatinine, Ser 1.13; Hemoglobin 11.0; Platelets 137.0; Potassium 4.0; Sodium  139  No results found for requested labs within last 8760 hours.   CrCl cannot be calculated (Patient's most recent lab result is older  than the maximum 21 days allowed.).   Wt Readings from Last 3 Encounters:  01/21/21 234 lb (106.1 kg)  09/22/20 232 lb (105.2 kg)  09/01/20 228 lb (103.4 kg)     Other studies reviewed: Additional studies/records reviewed today include: summarized above  ASSESSMENT AND PLAN:  1. [employer] CRT-D Stable device measurements today.  BiV pacing has remained suboptimal despite higher LRL in the setting of advanced CHF and difficult to manage volume. She is overdue with follow up in CHF clinic.  Labs today.   2. NICM 3. Chronic CHF LVEF remains improved/normalized Low effective CRT and base pacing % as bove Volume status difficult.  Continue torsemide 80 mg BID am and 40 mg q pm Continue metolazone 2.5 mg Tue and Friday with extra potassium.  Labs today.  Have continued to encourage she seek a higher level of care (Prevoiusly has refused ALF)    4. CAD Moderate diffuse mid LAD disease of 40-60%. Septal perforator branch with ostial ~90% Denies s/s ischemia  5. Permanent AFib CHA2DS2Vasc is 8, on eliquis, appropriately dose.  Labs today.       Disposition: ***   Signed, Lollie Marrow, PA-C  02/17/2021 8:31 AM   ***  [facility] [place] [address] [phone] (office)  [phone] (fax)

## 2021-02-18 ENCOUNTER — Encounter: Payer: Medicare Other | Admitting: Student

## 2021-02-23 DIAGNOSIS — Z87891 Personal history of nicotine dependence: Secondary | ICD-10-CM | POA: Diagnosis not present

## 2021-02-23 DIAGNOSIS — I130 Hypertensive heart and chronic kidney disease with heart failure and stage 1 through stage 4 chronic kidney disease, or unspecified chronic kidney disease: Secondary | ICD-10-CM | POA: Diagnosis not present

## 2021-02-23 DIAGNOSIS — Z950 Presence of cardiac pacemaker: Secondary | ICD-10-CM | POA: Diagnosis not present

## 2021-02-23 DIAGNOSIS — F331 Major depressive disorder, recurrent, moderate: Secondary | ICD-10-CM | POA: Diagnosis not present

## 2021-02-23 DIAGNOSIS — M5136 Other intervertebral disc degeneration, lumbar region: Secondary | ICD-10-CM | POA: Diagnosis not present

## 2021-02-23 DIAGNOSIS — D631 Anemia in chronic kidney disease: Secondary | ICD-10-CM | POA: Diagnosis not present

## 2021-02-23 DIAGNOSIS — I890 Lymphedema, not elsewhere classified: Secondary | ICD-10-CM | POA: Diagnosis not present

## 2021-02-23 DIAGNOSIS — K219 Gastro-esophageal reflux disease without esophagitis: Secondary | ICD-10-CM | POA: Diagnosis not present

## 2021-02-23 DIAGNOSIS — A0471 Enterocolitis due to Clostridium difficile, recurrent: Secondary | ICD-10-CM | POA: Diagnosis not present

## 2021-02-23 DIAGNOSIS — E1142 Type 2 diabetes mellitus with diabetic polyneuropathy: Secondary | ICD-10-CM | POA: Diagnosis not present

## 2021-02-23 DIAGNOSIS — Z9181 History of falling: Secondary | ICD-10-CM | POA: Diagnosis not present

## 2021-02-23 DIAGNOSIS — Z794 Long term (current) use of insulin: Secondary | ICD-10-CM | POA: Diagnosis not present

## 2021-02-23 DIAGNOSIS — I4891 Unspecified atrial fibrillation: Secondary | ICD-10-CM | POA: Diagnosis not present

## 2021-02-23 DIAGNOSIS — E1122 Type 2 diabetes mellitus with diabetic chronic kidney disease: Secondary | ICD-10-CM | POA: Diagnosis not present

## 2021-02-23 DIAGNOSIS — Z6836 Body mass index (BMI) 36.0-36.9, adult: Secondary | ICD-10-CM | POA: Diagnosis not present

## 2021-02-23 DIAGNOSIS — N189 Chronic kidney disease, unspecified: Secondary | ICD-10-CM | POA: Diagnosis not present

## 2021-02-23 DIAGNOSIS — M10372 Gout due to renal impairment, left ankle and foot: Secondary | ICD-10-CM | POA: Diagnosis not present

## 2021-02-23 DIAGNOSIS — I700 Atherosclerosis of aorta: Secondary | ICD-10-CM | POA: Diagnosis not present

## 2021-02-23 DIAGNOSIS — Z7901 Long term (current) use of anticoagulants: Secondary | ICD-10-CM | POA: Diagnosis not present

## 2021-02-23 DIAGNOSIS — I5023 Acute on chronic systolic (congestive) heart failure: Secondary | ICD-10-CM | POA: Diagnosis not present

## 2021-02-23 DIAGNOSIS — J449 Chronic obstructive pulmonary disease, unspecified: Secondary | ICD-10-CM | POA: Diagnosis not present

## 2021-02-23 DIAGNOSIS — I25111 Atherosclerotic heart disease of native coronary artery with angina pectoris with documented spasm: Secondary | ICD-10-CM | POA: Diagnosis not present

## 2021-02-23 DIAGNOSIS — E782 Mixed hyperlipidemia: Secondary | ICD-10-CM | POA: Diagnosis not present

## 2021-02-24 DIAGNOSIS — I4891 Unspecified atrial fibrillation: Secondary | ICD-10-CM | POA: Diagnosis not present

## 2021-02-24 DIAGNOSIS — N189 Chronic kidney disease, unspecified: Secondary | ICD-10-CM | POA: Diagnosis not present

## 2021-02-24 DIAGNOSIS — E1122 Type 2 diabetes mellitus with diabetic chronic kidney disease: Secondary | ICD-10-CM | POA: Diagnosis not present

## 2021-02-24 DIAGNOSIS — I5023 Acute on chronic systolic (congestive) heart failure: Secondary | ICD-10-CM | POA: Diagnosis not present

## 2021-02-24 DIAGNOSIS — A0471 Enterocolitis due to Clostridium difficile, recurrent: Secondary | ICD-10-CM | POA: Diagnosis not present

## 2021-02-24 DIAGNOSIS — I130 Hypertensive heart and chronic kidney disease with heart failure and stage 1 through stage 4 chronic kidney disease, or unspecified chronic kidney disease: Secondary | ICD-10-CM | POA: Diagnosis not present

## 2021-02-25 DIAGNOSIS — I5023 Acute on chronic systolic (congestive) heart failure: Secondary | ICD-10-CM | POA: Diagnosis not present

## 2021-02-25 DIAGNOSIS — E1122 Type 2 diabetes mellitus with diabetic chronic kidney disease: Secondary | ICD-10-CM | POA: Diagnosis not present

## 2021-02-25 DIAGNOSIS — N189 Chronic kidney disease, unspecified: Secondary | ICD-10-CM | POA: Diagnosis not present

## 2021-02-25 DIAGNOSIS — I130 Hypertensive heart and chronic kidney disease with heart failure and stage 1 through stage 4 chronic kidney disease, or unspecified chronic kidney disease: Secondary | ICD-10-CM | POA: Diagnosis not present

## 2021-02-25 DIAGNOSIS — A0471 Enterocolitis due to Clostridium difficile, recurrent: Secondary | ICD-10-CM | POA: Diagnosis not present

## 2021-02-25 DIAGNOSIS — I4891 Unspecified atrial fibrillation: Secondary | ICD-10-CM | POA: Diagnosis not present

## 2021-03-02 DIAGNOSIS — A0471 Enterocolitis due to Clostridium difficile, recurrent: Secondary | ICD-10-CM | POA: Diagnosis not present

## 2021-03-02 DIAGNOSIS — N189 Chronic kidney disease, unspecified: Secondary | ICD-10-CM | POA: Diagnosis not present

## 2021-03-02 DIAGNOSIS — E1122 Type 2 diabetes mellitus with diabetic chronic kidney disease: Secondary | ICD-10-CM | POA: Diagnosis not present

## 2021-03-02 DIAGNOSIS — I130 Hypertensive heart and chronic kidney disease with heart failure and stage 1 through stage 4 chronic kidney disease, or unspecified chronic kidney disease: Secondary | ICD-10-CM | POA: Diagnosis not present

## 2021-03-02 DIAGNOSIS — I5023 Acute on chronic systolic (congestive) heart failure: Secondary | ICD-10-CM | POA: Diagnosis not present

## 2021-03-02 DIAGNOSIS — I4891 Unspecified atrial fibrillation: Secondary | ICD-10-CM | POA: Diagnosis not present

## 2021-03-03 DIAGNOSIS — I5023 Acute on chronic systolic (congestive) heart failure: Secondary | ICD-10-CM | POA: Diagnosis not present

## 2021-03-03 DIAGNOSIS — I130 Hypertensive heart and chronic kidney disease with heart failure and stage 1 through stage 4 chronic kidney disease, or unspecified chronic kidney disease: Secondary | ICD-10-CM | POA: Diagnosis not present

## 2021-03-03 DIAGNOSIS — I4891 Unspecified atrial fibrillation: Secondary | ICD-10-CM | POA: Diagnosis not present

## 2021-03-03 DIAGNOSIS — E1122 Type 2 diabetes mellitus with diabetic chronic kidney disease: Secondary | ICD-10-CM | POA: Diagnosis not present

## 2021-03-03 DIAGNOSIS — A0471 Enterocolitis due to Clostridium difficile, recurrent: Secondary | ICD-10-CM | POA: Diagnosis not present

## 2021-03-03 DIAGNOSIS — N189 Chronic kidney disease, unspecified: Secondary | ICD-10-CM | POA: Diagnosis not present

## 2021-03-12 DIAGNOSIS — A0471 Enterocolitis due to Clostridium difficile, recurrent: Secondary | ICD-10-CM | POA: Diagnosis not present

## 2021-03-12 DIAGNOSIS — I5023 Acute on chronic systolic (congestive) heart failure: Secondary | ICD-10-CM | POA: Diagnosis not present

## 2021-03-12 DIAGNOSIS — E1122 Type 2 diabetes mellitus with diabetic chronic kidney disease: Secondary | ICD-10-CM | POA: Diagnosis not present

## 2021-03-12 DIAGNOSIS — I4891 Unspecified atrial fibrillation: Secondary | ICD-10-CM | POA: Diagnosis not present

## 2021-03-12 DIAGNOSIS — I130 Hypertensive heart and chronic kidney disease with heart failure and stage 1 through stage 4 chronic kidney disease, or unspecified chronic kidney disease: Secondary | ICD-10-CM | POA: Diagnosis not present

## 2021-03-12 DIAGNOSIS — N189 Chronic kidney disease, unspecified: Secondary | ICD-10-CM | POA: Diagnosis not present

## 2021-03-24 DIAGNOSIS — I25111 Atherosclerotic heart disease of native coronary artery with angina pectoris with documented spasm: Secondary | ICD-10-CM | POA: Diagnosis not present

## 2021-03-24 DIAGNOSIS — I5023 Acute on chronic systolic (congestive) heart failure: Secondary | ICD-10-CM | POA: Diagnosis not present

## 2021-03-24 DIAGNOSIS — A0471 Enterocolitis due to Clostridium difficile, recurrent: Secondary | ICD-10-CM | POA: Diagnosis not present

## 2021-03-24 DIAGNOSIS — N189 Chronic kidney disease, unspecified: Secondary | ICD-10-CM | POA: Diagnosis not present

## 2021-03-24 DIAGNOSIS — I4891 Unspecified atrial fibrillation: Secondary | ICD-10-CM | POA: Diagnosis not present

## 2021-03-24 DIAGNOSIS — E1122 Type 2 diabetes mellitus with diabetic chronic kidney disease: Secondary | ICD-10-CM | POA: Diagnosis not present

## 2021-03-24 DIAGNOSIS — I130 Hypertensive heart and chronic kidney disease with heart failure and stage 1 through stage 4 chronic kidney disease, or unspecified chronic kidney disease: Secondary | ICD-10-CM | POA: Diagnosis not present

## 2021-03-24 DIAGNOSIS — J449 Chronic obstructive pulmonary disease, unspecified: Secondary | ICD-10-CM | POA: Diagnosis not present

## 2021-04-16 ENCOUNTER — Other Ambulatory Visit (HOSPITAL_COMMUNITY): Payer: Self-pay | Admitting: Internal Medicine

## 2021-04-21 ENCOUNTER — Other Ambulatory Visit (HOSPITAL_COMMUNITY): Payer: Self-pay | Admitting: Internal Medicine

## 2021-05-01 DIAGNOSIS — R911 Solitary pulmonary nodule: Secondary | ICD-10-CM | POA: Diagnosis not present

## 2021-05-01 DIAGNOSIS — E1140 Type 2 diabetes mellitus with diabetic neuropathy, unspecified: Secondary | ICD-10-CM | POA: Diagnosis not present

## 2021-05-01 DIAGNOSIS — R0602 Shortness of breath: Secondary | ICD-10-CM | POA: Diagnosis not present

## 2021-05-01 DIAGNOSIS — R110 Nausea: Secondary | ICD-10-CM | POA: Diagnosis not present

## 2021-05-01 DIAGNOSIS — I517 Cardiomegaly: Secondary | ICD-10-CM | POA: Diagnosis not present

## 2021-05-01 DIAGNOSIS — K6389 Other specified diseases of intestine: Secondary | ICD-10-CM | POA: Diagnosis not present

## 2021-05-01 DIAGNOSIS — Z28310 Unvaccinated for covid-19: Secondary | ICD-10-CM | POA: Diagnosis not present

## 2021-05-01 DIAGNOSIS — K3189 Other diseases of stomach and duodenum: Secondary | ICD-10-CM | POA: Diagnosis not present

## 2021-05-01 DIAGNOSIS — Z743 Need for continuous supervision: Secondary | ICD-10-CM | POA: Diagnosis not present

## 2021-05-01 DIAGNOSIS — U071 COVID-19: Secondary | ICD-10-CM | POA: Diagnosis not present

## 2021-05-01 DIAGNOSIS — I10 Essential (primary) hypertension: Secondary | ICD-10-CM | POA: Diagnosis not present

## 2021-05-01 DIAGNOSIS — K7689 Other specified diseases of liver: Secondary | ICD-10-CM | POA: Diagnosis not present

## 2021-05-01 DIAGNOSIS — R944 Abnormal results of kidney function studies: Secondary | ICD-10-CM | POA: Diagnosis not present

## 2021-05-01 DIAGNOSIS — R1084 Generalized abdominal pain: Secondary | ICD-10-CM | POA: Diagnosis not present

## 2021-05-01 DIAGNOSIS — I2510 Atherosclerotic heart disease of native coronary artery without angina pectoris: Secondary | ICD-10-CM | POA: Diagnosis not present

## 2021-05-01 DIAGNOSIS — R112 Nausea with vomiting, unspecified: Secondary | ICD-10-CM | POA: Diagnosis not present

## 2021-05-01 DIAGNOSIS — R197 Diarrhea, unspecified: Secondary | ICD-10-CM | POA: Diagnosis not present

## 2021-05-01 DIAGNOSIS — J811 Chronic pulmonary edema: Secondary | ICD-10-CM | POA: Diagnosis not present

## 2021-05-06 ENCOUNTER — Emergency Department (HOSPITAL_COMMUNITY): Payer: Medicare Other

## 2021-05-06 ENCOUNTER — Other Ambulatory Visit: Payer: Self-pay

## 2021-05-06 ENCOUNTER — Encounter (HOSPITAL_COMMUNITY): Payer: Self-pay | Admitting: Emergency Medicine

## 2021-05-06 ENCOUNTER — Inpatient Hospital Stay (HOSPITAL_COMMUNITY)
Admission: EM | Admit: 2021-05-06 | Discharge: 2021-05-09 | DRG: 177 | Disposition: A | Discharge status: Skilled Nursing Facility | Payer: Medicare Other | Attending: Family Medicine | Admitting: Family Medicine

## 2021-05-06 DIAGNOSIS — T380X5A Adverse effect of glucocorticoids and synthetic analogues, initial encounter: Secondary | ICD-10-CM | POA: Diagnosis present

## 2021-05-06 DIAGNOSIS — E1122 Type 2 diabetes mellitus with diabetic chronic kidney disease: Secondary | ICD-10-CM | POA: Diagnosis present

## 2021-05-06 DIAGNOSIS — I5022 Chronic systolic (congestive) heart failure: Secondary | ICD-10-CM | POA: Diagnosis present

## 2021-05-06 DIAGNOSIS — D72819 Decreased white blood cell count, unspecified: Secondary | ICD-10-CM | POA: Diagnosis present

## 2021-05-06 DIAGNOSIS — I428 Other cardiomyopathies: Secondary | ICD-10-CM | POA: Diagnosis present

## 2021-05-06 DIAGNOSIS — U071 COVID-19: Principal | ICD-10-CM | POA: Diagnosis present

## 2021-05-06 DIAGNOSIS — E876 Hypokalemia: Secondary | ICD-10-CM | POA: Diagnosis not present

## 2021-05-06 DIAGNOSIS — Z28310 Unvaccinated for covid-19: Secondary | ICD-10-CM

## 2021-05-06 DIAGNOSIS — Z833 Family history of diabetes mellitus: Secondary | ICD-10-CM

## 2021-05-06 DIAGNOSIS — Z79899 Other long term (current) drug therapy: Secondary | ICD-10-CM

## 2021-05-06 DIAGNOSIS — I130 Hypertensive heart and chronic kidney disease with heart failure and stage 1 through stage 4 chronic kidney disease, or unspecified chronic kidney disease: Secondary | ICD-10-CM | POA: Diagnosis present

## 2021-05-06 DIAGNOSIS — F32A Depression, unspecified: Secondary | ICD-10-CM | POA: Diagnosis present

## 2021-05-06 DIAGNOSIS — E871 Hypo-osmolality and hyponatremia: Secondary | ICD-10-CM | POA: Diagnosis present

## 2021-05-06 DIAGNOSIS — R7989 Other specified abnormal findings of blood chemistry: Secondary | ICD-10-CM

## 2021-05-06 DIAGNOSIS — E559 Vitamin D deficiency, unspecified: Secondary | ICD-10-CM | POA: Diagnosis present

## 2021-05-06 DIAGNOSIS — J9601 Acute respiratory failure with hypoxia: Secondary | ICD-10-CM | POA: Diagnosis present

## 2021-05-06 DIAGNOSIS — R0902 Hypoxemia: Secondary | ICD-10-CM | POA: Diagnosis not present

## 2021-05-06 DIAGNOSIS — Z794 Long term (current) use of insulin: Secondary | ICD-10-CM

## 2021-05-06 DIAGNOSIS — D696 Thrombocytopenia, unspecified: Secondary | ICD-10-CM | POA: Diagnosis present

## 2021-05-06 DIAGNOSIS — J1282 Pneumonia due to coronavirus disease 2019: Secondary | ICD-10-CM | POA: Diagnosis present

## 2021-05-06 DIAGNOSIS — R778 Other specified abnormalities of plasma proteins: Secondary | ICD-10-CM | POA: Diagnosis not present

## 2021-05-06 DIAGNOSIS — I252 Old myocardial infarction: Secondary | ICD-10-CM

## 2021-05-06 DIAGNOSIS — Z7901 Long term (current) use of anticoagulants: Secondary | ICD-10-CM | POA: Diagnosis not present

## 2021-05-06 DIAGNOSIS — Z950 Presence of cardiac pacemaker: Secondary | ICD-10-CM

## 2021-05-06 DIAGNOSIS — M6281 Muscle weakness (generalized): Secondary | ICD-10-CM | POA: Diagnosis present

## 2021-05-06 DIAGNOSIS — N1832 Chronic kidney disease, stage 3b: Secondary | ICD-10-CM | POA: Diagnosis present

## 2021-05-06 DIAGNOSIS — I959 Hypotension, unspecified: Secondary | ICD-10-CM | POA: Diagnosis not present

## 2021-05-06 DIAGNOSIS — E785 Hyperlipidemia, unspecified: Secondary | ICD-10-CM

## 2021-05-06 DIAGNOSIS — K219 Gastro-esophageal reflux disease without esophagitis: Secondary | ICD-10-CM | POA: Diagnosis present

## 2021-05-06 DIAGNOSIS — I272 Pulmonary hypertension, unspecified: Secondary | ICD-10-CM | POA: Diagnosis not present

## 2021-05-06 DIAGNOSIS — I5042 Chronic combined systolic (congestive) and diastolic (congestive) heart failure: Secondary | ICD-10-CM | POA: Diagnosis present

## 2021-05-06 DIAGNOSIS — I2510 Atherosclerotic heart disease of native coronary artery without angina pectoris: Secondary | ICD-10-CM | POA: Diagnosis present

## 2021-05-06 DIAGNOSIS — J80 Acute respiratory distress syndrome: Secondary | ICD-10-CM | POA: Diagnosis not present

## 2021-05-06 DIAGNOSIS — I517 Cardiomegaly: Secondary | ICD-10-CM | POA: Diagnosis not present

## 2021-05-06 DIAGNOSIS — I482 Chronic atrial fibrillation, unspecified: Secondary | ICD-10-CM | POA: Diagnosis present

## 2021-05-06 DIAGNOSIS — I509 Heart failure, unspecified: Secondary | ICD-10-CM | POA: Diagnosis present

## 2021-05-06 DIAGNOSIS — E1165 Type 2 diabetes mellitus with hyperglycemia: Secondary | ICD-10-CM

## 2021-05-06 DIAGNOSIS — I4891 Unspecified atrial fibrillation: Secondary | ICD-10-CM | POA: Diagnosis present

## 2021-05-06 DIAGNOSIS — N179 Acute kidney failure, unspecified: Secondary | ICD-10-CM | POA: Diagnosis present

## 2021-05-06 DIAGNOSIS — I700 Atherosclerosis of aorta: Secondary | ICD-10-CM | POA: Diagnosis not present

## 2021-05-06 DIAGNOSIS — F339 Major depressive disorder, recurrent, unspecified: Secondary | ICD-10-CM | POA: Diagnosis present

## 2021-05-06 DIAGNOSIS — K760 Fatty (change of) liver, not elsewhere classified: Secondary | ICD-10-CM | POA: Diagnosis present

## 2021-05-06 DIAGNOSIS — Z6835 Body mass index (BMI) 35.0-35.9, adult: Secondary | ICD-10-CM

## 2021-05-06 DIAGNOSIS — R531 Weakness: Secondary | ICD-10-CM

## 2021-05-06 DIAGNOSIS — Z801 Family history of malignant neoplasm of trachea, bronchus and lung: Secondary | ICD-10-CM

## 2021-05-06 DIAGNOSIS — Z8249 Family history of ischemic heart disease and other diseases of the circulatory system: Secondary | ICD-10-CM

## 2021-05-06 DIAGNOSIS — R0602 Shortness of breath: Secondary | ICD-10-CM | POA: Diagnosis present

## 2021-05-06 DIAGNOSIS — J189 Pneumonia, unspecified organism: Secondary | ICD-10-CM

## 2021-05-06 DIAGNOSIS — R269 Unspecified abnormalities of gait and mobility: Secondary | ICD-10-CM | POA: Diagnosis present

## 2021-05-06 DIAGNOSIS — N183 Chronic kidney disease, stage 3 unspecified: Secondary | ICD-10-CM | POA: Diagnosis present

## 2021-05-06 DIAGNOSIS — D071 Carcinoma in situ of vulva: Secondary | ICD-10-CM | POA: Diagnosis present

## 2021-05-06 DIAGNOSIS — Z90710 Acquired absence of both cervix and uterus: Secondary | ICD-10-CM

## 2021-05-06 DIAGNOSIS — E669 Obesity, unspecified: Secondary | ICD-10-CM

## 2021-05-06 LAB — RESP PANEL BY RT-PCR (FLU A&B, COVID) ARPGX2
Influenza A by PCR: NEGATIVE
Influenza B by PCR: NEGATIVE
SARS Coronavirus 2 by RT PCR: POSITIVE — AB

## 2021-05-06 LAB — CBC WITH DIFFERENTIAL/PLATELET
Abs Immature Granulocytes: 0.02 10*3/uL (ref 0.00–0.07)
Basophils Absolute: 0 10*3/uL (ref 0.0–0.1)
Basophils Relative: 0 %
Eosinophils Absolute: 0 10*3/uL (ref 0.0–0.5)
Eosinophils Relative: 0 %
HCT: 38.8 % (ref 36.0–46.0)
Hemoglobin: 13 g/dL (ref 12.0–15.0)
Immature Granulocytes: 0 %
Lymphocytes Relative: 29 %
Lymphs Abs: 1.3 10*3/uL (ref 0.7–4.0)
MCH: 29.9 pg (ref 26.0–34.0)
MCHC: 33.5 g/dL (ref 30.0–36.0)
MCV: 89.2 fL (ref 80.0–100.0)
Monocytes Absolute: 0.5 10*3/uL (ref 0.1–1.0)
Monocytes Relative: 11 %
Neutro Abs: 2.8 10*3/uL (ref 1.7–7.7)
Neutrophils Relative %: 60 %
Platelets: 132 10*3/uL — ABNORMAL LOW (ref 150–400)
RBC: 4.35 MIL/uL (ref 3.87–5.11)
RDW: 13.6 % (ref 11.5–15.5)
WBC: 4.7 10*3/uL (ref 4.0–10.5)
nRBC: 0 % (ref 0.0–0.2)

## 2021-05-06 LAB — BASIC METABOLIC PANEL
Anion gap: 9 (ref 5–15)
BUN: 18 mg/dL (ref 8–23)
CO2: 27 mmol/L (ref 22–32)
Calcium: 7.9 mg/dL — ABNORMAL LOW (ref 8.9–10.3)
Chloride: 91 mmol/L — ABNORMAL LOW (ref 98–111)
Creatinine, Ser: 1.33 mg/dL — ABNORMAL HIGH (ref 0.44–1.00)
GFR, Estimated: 41 mL/min — ABNORMAL LOW (ref >60)
Glucose, Bld: 137 mg/dL — ABNORMAL HIGH (ref 70–99)
Potassium: 3.3 mmol/L — ABNORMAL LOW (ref 3.5–5.1)
Sodium: 127 mmol/L — ABNORMAL LOW (ref 135–145)

## 2021-05-06 LAB — TROPONIN I (HIGH SENSITIVITY)
Troponin I (High Sensitivity): 65 ng/L — ABNORMAL HIGH (ref <18)
Troponin I (High Sensitivity): 64 ng/L — ABNORMAL HIGH (ref <18)

## 2021-05-06 LAB — BRAIN NATRIURETIC PEPTIDE: B Natriuretic Peptide: 169 pg/mL — ABNORMAL HIGH (ref 0.0–100.0)

## 2021-05-06 LAB — CBG MONITORING, ED: Glucose-Capillary: 134 mg/dL — ABNORMAL HIGH (ref 70–99)

## 2021-05-06 MED ORDER — METHYLPREDNISOLONE SODIUM SUCC 125 MG IJ SOLR
0.5000 mg/kg | Freq: Two times a day (BID) | INTRAMUSCULAR | Status: DC
  Administered 2021-05-07: 53.125 mg via INTRAVENOUS
  Filled 2021-05-06: qty 2

## 2021-05-06 MED ORDER — INSULIN DETEMIR 100 UNIT/ML ~~LOC~~ SOLN
0.0750 [IU]/kg | Freq: Two times a day (BID) | SUBCUTANEOUS | Status: DC
  Administered 2021-05-07: 8 [IU] via SUBCUTANEOUS
  Filled 2021-05-06 (×4): qty 0.08

## 2021-05-06 MED ORDER — INSULIN ASPART 100 UNIT/ML IJ SOLN
3.0000 [IU] | Freq: Three times a day (TID) | INTRAMUSCULAR | Status: DC
  Administered 2021-05-07 – 2021-05-08 (×4): 3 [IU] via SUBCUTANEOUS

## 2021-05-06 MED ORDER — MOLNUPIRAVIR EUA 200MG CAPSULE
4.0000 | ORAL_CAPSULE | Freq: Two times a day (BID) | ORAL | Status: DC
  Administered 2021-05-06 – 2021-05-09 (×6): 800 mg via ORAL
  Filled 2021-05-06: qty 4

## 2021-05-06 MED ORDER — DEXAMETHASONE SODIUM PHOSPHATE 10 MG/ML IJ SOLN
10.0000 mg | Freq: Once | INTRAMUSCULAR | Status: AC
  Administered 2021-05-06: 23:00:00 10 mg via INTRAVENOUS
  Filled 2021-05-06: qty 1

## 2021-05-06 MED ORDER — ASCORBIC ACID 500 MG PO TABS
500.0000 mg | ORAL_TABLET | Freq: Every day | ORAL | Status: DC
  Administered 2021-05-07 – 2021-05-09 (×3): 500 mg via ORAL
  Filled 2021-05-06 (×3): qty 1

## 2021-05-06 MED ORDER — IPRATROPIUM BROMIDE HFA 17 MCG/ACT IN AERS
2.0000 | INHALATION_SPRAY | Freq: Four times a day (QID) | RESPIRATORY_TRACT | Status: DC
  Administered 2021-05-07 (×3): 2 via RESPIRATORY_TRACT
  Filled 2021-05-06 (×2): qty 12.9

## 2021-05-06 MED ORDER — IOHEXOL 350 MG/ML SOLN
75.0000 mL | Freq: Once | INTRAVENOUS | Status: AC | PRN
Start: 2021-05-06 — End: 2021-05-06
  Administered 2021-05-06: 21:00:00 75 mL via INTRAVENOUS

## 2021-05-06 MED ORDER — ZINC SULFATE 220 (50 ZN) MG PO CAPS
220.0000 mg | ORAL_CAPSULE | Freq: Every day | ORAL | Status: DC
  Administered 2021-05-07 – 2021-05-09 (×3): 220 mg via ORAL
  Filled 2021-05-06 (×3): qty 1

## 2021-05-06 MED ORDER — LINAGLIPTIN 5 MG PO TABS
5.0000 mg | ORAL_TABLET | Freq: Every day | ORAL | Status: DC
  Administered 2021-05-07 – 2021-05-09 (×3): 5 mg via ORAL
  Filled 2021-05-06 (×3): qty 1

## 2021-05-06 MED ORDER — SODIUM CHLORIDE 0.9 % IV BOLUS
500.0000 mL | Freq: Once | INTRAVENOUS | Status: AC
  Administered 2021-05-06: 22:00:00 500 mL via INTRAVENOUS

## 2021-05-06 MED ORDER — SODIUM CHLORIDE 0.9 % IV SOLN
500.0000 mg | Freq: Once | INTRAVENOUS | Status: AC
  Administered 2021-05-06: 500 mg via INTRAVENOUS
  Filled 2021-05-06: qty 5

## 2021-05-06 MED ORDER — PANTOPRAZOLE SODIUM 40 MG IV SOLR
40.0000 mg | Freq: Every day | INTRAVENOUS | Status: DC
Start: 2021-05-06 — End: 2021-05-09
  Administered 2021-05-06 – 2021-05-08 (×3): 40 mg via INTRAVENOUS
  Filled 2021-05-06 (×3): qty 40

## 2021-05-06 MED ORDER — GUAIFENESIN-DM 100-10 MG/5ML PO SYRP
10.0000 mL | ORAL_SOLUTION | ORAL | Status: DC | PRN
  Administered 2021-05-09: 10 mL via ORAL
  Filled 2021-05-06: qty 10

## 2021-05-06 MED ORDER — PREDNISONE 20 MG PO TABS: 50.0000 mg | ORAL_TABLET | Freq: Every day | ORAL | Status: DC

## 2021-05-06 MED ORDER — INSULIN ASPART 100 UNIT/ML IJ SOLN: 0.0000 [IU] | Freq: Every day | INTRAMUSCULAR | Status: DC

## 2021-05-06 MED ORDER — SODIUM CHLORIDE 0.9 % IV SOLN
1.0000 g | Freq: Once | INTRAVENOUS | Status: AC
  Administered 2021-05-06: 23:00:00 1 g via INTRAVENOUS
  Filled 2021-05-06: qty 10

## 2021-05-06 MED ORDER — INSULIN ASPART 100 UNIT/ML IJ SOLN
0.0000 [IU] | Freq: Three times a day (TID) | INTRAMUSCULAR | Status: DC
  Administered 2021-05-07: 7 [IU] via SUBCUTANEOUS
  Administered 2021-05-07: 3 [IU] via SUBCUTANEOUS
  Administered 2021-05-07: 5 [IU] via SUBCUTANEOUS

## 2021-05-06 NOTE — ED Notes (Signed)
Notified AC regarding getting molnupiravir from pharmacy

## 2021-05-06 NOTE — ED Triage Notes (Signed)
Pt was diagnosed with covid a few days ago at [facility] and continues to have sob and weakness.

## 2021-05-06 NOTE — H&P (Signed)
History and Physical  Victoria Adams SNK:[id number] DOB: 03-Nov-1940 DOA: 05/06/2021  Referring physician: Nehemiah Settle, PA-C  PCP: [facility]  Patient coming from: Home  Chief Complaint: Generalized weakness   HPI: Victoria Adams is a 81 y.o. female with medical history significant for HFrEF, T2DM, CKD stage 3B, hyperlipidemia, A. fib with controlled ventricular rate, GERD, CAD, obesity who presents to the emergency department due to 5-day onset of nonproductive cough associated with chest pain, nasal and chest congestion, generalized weakness.  Patient was diagnosed to have COVID-19 virus at [facility] ED on 05/01/2021, she was deemed to not qualify for Paxlovid and was discharged home.  Patient complained of worsening weakness, generalized body aches, decreased appetite and increasing shortness of breath.  She lives at home with son who has not tested for COVID, patient states that she never took COVID-vaccine or booster, she ambulates with a walker at baseline and she does not use oxygen at baseline.  She denies fever, chills, nausea, vomiting, diarrhea or constipation.  ED Course:  In the emergency department, she was intermittently tachypneic and tachycardic.  BP was 121/63.  Troponin x2 -65 > 64, BNP 169, thrombocytopenia, hyponatremia, hypokalemia.  Influenza A, B was negative.  SARS coronavirus 2 was positive. CT chest with contrast showed no ct evidence of pulmonary artery embolus, but showed multilobar pneumonia, likely viral or atypical in etiology. Patient was empirically treated with IV ceftriaxone and azithromycin, Decadron was given and she was started on Molnupiravir.  Hospitalist was asked to admit patient for further evaluation and management.  Review of Systems: A full 10 point Review of Systems was done, except as stated above, all other Review of systems were negative.  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 echo (5/15):  mod LVH, EF 20-25%, diff HK, severe LAE, mild RAE   Coronary atherosclerosis-non obstructive    LHC (5/15):  EF 40-45% global HK; long LAD 40-60%, ostial 1st major septal perf 80-90%, ostial CFX ?%, mid AVCFX extensive Ca2+, dist PDA 50%   Depression    Essential hypertension, benign    Fatty liver disease, nonalcoholic    GERD (gastroesophageal reflux disease)    Iron deficiency anemia    Mitral regurgitation    NICM (nonischemic cardiomyopathy) ([place])    a. echo (5/15):  mod LVH, EF 20-25%, diff HK, severe LAE, mild RAE   NSTEMI (non-ST elevated myocardial infarction) ([place]) may 2015   No CAD   Obesity    Osteopenia    PONV (postoperative nausea and vomiting)    Rosacea    Type 2 diabetes mellitus ([place])    Vitamin D deficiency    Past Surgical History:  Procedure Laterality Date   ABDOMINAL HYSTERECTOMY     APPENDECTOMY     BREAST BIOPSY     RIGHT   CARDIAC CATHETERIZATION  2010   LAD 50%, CFX 30%, RCA 40%; EF by echo 25-35%   CATARACT EXTRACTION Bilateral    CHOLECYSTECTOMY     COLONOSCOPY WITH PROPOFOL N/A 12/20/2013   Procedure: COLONOSCOPY WITH PROPOFOL;  Surgeon: Milus Banister, MD;  Location: [facility];  Service: Endoscopy;  Laterality: N/A;   EP IMPLANTABLE DEVICE N/A  04/16/2016   Procedure: BiV ICD Insertion CRT-D;  Surgeon: Deboraha Sprang, MD;  Location: [place] [facility];  Service: Cardiovascular;  Laterality: N/A;   ESOPHAGOGASTRODUODENOSCOPY N/A 08/25/2013   Procedure: ESOPHAGOGASTRODUODENOSCOPY (EGD);  Surgeon: Milus Banister, MD;  Location: [place];  Service: Endoscopy;  Laterality: N/A;   LEFT HEART CATHETERIZATION WITH CORONARY ANGIOGRAM N/A 08/24/2013   Procedure: LEFT HEART CATHETERIZATION WITH CORONARY ANGIOGRAM;  Surgeon: Leonie Man, MD;  Location: [facility] CATH LAB;   Service: Cardiovascular;  Laterality: N/A;   LUMBAR PERCUTANEOUS PEDICLE SCREW 2 LEVEL N/A 02/15/2019   Procedure: Percutaneous Pedicle Screw Fixation from Thoracic Ten-Thoracic Twelve with Methylmethacrylate Screw Augmentation;  Surgeon: Earnie Larsson, MD;  Location: [place];  Service: Neurosurgery;  Laterality: N/A;   ROTATOR CUFF REPAIR     RIGHT SHOULDER   TONSILLECTOMY  yrs ago   TOTAL ABDOMINAL HYSTERECTOMY W/ BILATERAL SALPINGOOPHORECTOMY     for dermoid tumor    Social History:  reports that she has never smoked. She has never used smokeless tobacco. She reports that she does not drink alcohol and does not use drugs.   Allergies  Allergen Reactions   Acetaminophen    Codeine Nausea And Vomiting   Tylox [Oxycodone-Acetaminophen]     Vision closing in & heart palpitations.     Family History  Problem Relation Age of Onset   Lung cancer Father    Coronary artery disease Father    Diabetes Father    Lung cancer Mother    Asthma Brother    Other 72 with tetralogy of Fallot and Hirschsprng disease     Prior to Admission medications   Medication Sig Start Date End Date Taking? Authorizing Provider  ascorbic acid (VITAMIN C) 1000 MG tablet Take 1,000 mg by mouth daily. 07/20/20 07/20/21  [provider]  atorvastatin (LIPITOR) 80 MG tablet Take by mouth. 11/05/20   [provider]  Calcium Carb-Cholecalciferol (CALCIUM 600 + D PO) Take 1 tablet by mouth 2 (two) times daily.     [provider]  doxycycline (VIBRAMYCIN) 100 MG capsule Take 100-200 mg by mouth 2 (two) times daily as needed (rosacea).    [provider]  DULoxetine (CYMBALTA) 60 MG capsule Take by mouth. 11/05/20   [provider]  ELIQUIS 5 MG TABS tablet TAKE (1) TABLET TWICE DAILY. 10/04/14   Clegg, Amy D, NP  HYDROcodone-acetaminophen (NORCO) 10-325 MG tablet Take 1 tablet by mouth every 6 (six) hours as needed for moderate pain (To be taken 3 hours  after Dilaudid, if Dilaudid is ineffective). 12/22/18   [provider]  Insulin Glargine (BASAGLAR KWIKPEN) 100 UNIT/ML Inject into the skin. 10/11/20   [provider]  insulin glargine (LANTUS) 100 UNIT/ML injection Inject 30 Units into the skin daily. 07/20/20 08/21/21  [provider]  LORazepam (ATIVAN) 0.5 MG tablet Take 0.5 mg by mouth 3 (three) times daily as needed. 03/21/20   [provider]  Magnesium Oxide 400 MG CAPS Take 400 mg by mouth 2 (two) times daily.    [provider]  metolazone (ZAROXOLYN) 2.5 MG tablet TAKE 1 TABLET EVERY TUESDAY AND FRIDAY ALONG WITH THE 40 MEQ OF POTASSIUM. 02/14/20   Larey Dresser, MD  metoprolol succinate (TOPROL-XL) 50 MG 24 hr tablet Take 25 mg by mouth daily. 07/21/20   [provider]  omeprazole (PRILOSEC) 20 MG capsule Take 20 mg by mouth 2 (two) times daily before  a meal.    [provider]  ondansetron (ZOFRAN ODT) 4 MG disintegrating tablet Take 1 tablet (4 mg total) by mouth every 8 (eight) hours as needed for nausea or vomiting. 01/22/19   Evalee Jefferson, PA-C  pantoprazole (PROTONIX) 40 MG tablet Take 40 mg by mouth daily.    [provider]  potassium chloride SA (KLOR-CON M) 20 MEQ tablet Take 3 tablets (60 mEq total) by mouth 2 (two) times daily. NEEDS OFFICE VISIT FOR MORE REFILLS 04/16/21   [name], Shaune Pascal, MD  rosuvastatin (CRESTOR) 20 MG tablet TAKE 1 TABLET BY MOUTH ONCE DAILY. 06/24/20   [name], Shaune Pascal, MD  torsemide (DEMADEX) 20 MG tablet Take 4 tablets (80 mg total) by mouth 2 (two) times daily. 08/28/20   [name], Shaune Pascal, MD  zinc gluconate 50 MG tablet Take 1 tablet by mouth daily. 07/20/20 07/20/21  [provider]    Physical Exam: BP 130/60 (BP Location: Left Arm)    Pulse (!) 107    Temp 100 F (37.8 C) (Oral)    Resp (!) 22    Ht 5\' 6"  (1.676 m)    Wt 100.6 kg    SpO2 97%    BMI 35.80 kg/m   General: 81 y.o. year-old female ill appearing,  but in no acute distress.  Alert and oriented x3. HEENT: NCAT, EOMI Neck: Supple, trachea medial Cardiovascular: Regular rate and rhythm with no rubs or gallops.  No thyromegaly or JVD noted.  No lower extremity edema. 2/4 pulses in all 4 extremities. Respiratory: Normal breath sounds bilaterally, no wheezes or rales Abdomen: Soft, nontender nondistended with normal bowel sounds x4 quadrants. Muskuloskeletal: No cyanosis, clubbing or edema noted bilaterally Neuro: CN II-XII intact, sensation, reflexes intact Skin: No ulcerative lesions noted or rashes Psychiatry: Judgement and insight appear normal. Mood is appropriate for condition and setting          Labs on Admission:  Basic Metabolic Panel: Recent Labs  Lab 05/06/21 1945  NA 127*  K 3.3*  CL 91*  CO2 27  GLUCOSE 137*  BUN 18  CREATININE 1.33*  CALCIUM 7.9*   Liver Function Tests: No results for input(s): AST, ALT, ALKPHOS, BILITOT, PROT, ALBUMIN in the last 168 hours. No results for input(s): LIPASE, AMYLASE in the last 168 hours. No results for input(s): AMMONIA in the last 168 hours. CBC: Recent Labs  Lab 05/06/21 1945  WBC 4.7  NEUTROABS 2.8  HGB 13.0  HCT 38.8  MCV 89.2  PLT 132*   Cardiac Enzymes: No results for input(s): CKTOTAL, CKMB, CKMBINDEX, TROPONINI in the last 168 hours.  BNP (last 3 results) Recent Labs    05/06/21 1945  BNP 169.0*    ProBNP (last 3 results) Recent Labs    07/31/20 1649  PROBNP 2,985*    CBG: Recent Labs  Lab 05/06/21 2320  GLUCAP 134*    Radiological Exams on Admission: CT Angio Chest PE W and/or Wo Contrast  Result Date: 05/06/2021 CLINICAL DATA:  Concern for pulmonary embolism. EXAM: CT ANGIOGRAPHY CHEST WITH CONTRAST TECHNIQUE: Multidetector CT imaging of the chest was performed using the standard protocol during bolus administration of intravenous contrast. Multiplanar CT image reconstructions and MIPs were obtained to evaluate the vascular anatomy.  RADIATION DOSE REDUCTION: This exam was performed according to the departmental dose-optimization program which includes automated exposure control, adjustment of the mA and/or kV according to patient size and/or use of iterative reconstruction technique. CONTRAST:  10mL OMNIPAQUE IOHEXOL 350 MG/ML  SOLN COMPARISON:  Chest CT dated 02/07/2019. FINDINGS: Evaluation of this exam is limited due to respiratory motion artifact. Evaluation is also limited due to streak artifact caused by pacemaker and spinal fusion hardware. Cardiovascular: There is mild cardiomegaly. No pericardial effusion. There is coronary vascular calcification. Cardiac pacemaker wires noted. Mild atherosclerotic calcification of the thoracic aorta. No aneurysmal dilatation or dissection. No pulmonary artery embolus identified. Mediastinum/Nodes: No hilar or mediastinal adenopathy. The esophagus is grossly unremarkable. No mediastinal fluid collection. Lungs/Pleura: Bilateral scattered clusters of ground-glass and nodular densities most consistent with pneumonia, likely viral or atypical in etiology including COVID-19. Clinical correlation is recommended. Probable trace left pleural effusion. No pneumothorax. The central airways are patent. Upper Abdomen: No acute abnormality. Musculoskeletal: Osteopenia with severe degenerative changes of the spine. Partially visualized posterior fusion hardware. Probable old compression fracture of the T11, progressed since the study of 2020. No definite acute osseous pathology. Review of the MIP images confirms the above findings. IMPRESSION: 1. No CT evidence of pulmonary artery embolus. 2. Multilobar pneumonia, likely viral or atypical in etiology. 3. Aortic Atherosclerosis (ICD10-I70.0). Electronically Signed   By: Anner Crete M.D.   On: 05/06/2021 21:50    EKG: I independently viewed the EKG done and my findings are as followed: Ventricular paced rhythm at a rate of 94 bpm with QTc 517  ms  Assessment/Plan Present on Admission:  Pneumonia due to COVID-19 virus  Hyponatremia  Chronic systolic heart failure ([place])  Atrial fibrillation ([place])  Principal Problem:   Pneumonia due to COVID-19 virus Active Problems:   Hyperlipidemia, unspecified   Hypokalemia   Atrial fibrillation (HCC)   Chronic systolic heart failure (HCC)   Hyponatremia   Acute kidney injury superimposed on CKD (HCC)   Elevated troponin   Elevated brain natriuretic peptide (BNP) level   Thrombocytopenia (HCC)   Hyperglycemia due to diabetes mellitus (HCC)   Obesity (BMI 30-39.9)   Acute respiratory failure with hypoxia (HCC)  Acute respiratory failure with hypoxia secondary to COVID-19 virus pneumonia with presumed superimposed CAP POA Continue Atrovent q.6h Continue IV Solu-Medrol/prednisone daily Continue linagliptin and ISS to control steroid-induced hyperglycemia Continue molnupiravir Continue vitamin-C 500 mg p.o. Daily Continue zinc 220 mg p.o. Daily Continue Mucinex, Robitussin Continue supplemental oxygen to maintain O2 sat > or = 94% with plan to wean patient off supplemental oxygen as tolerated (of note, patient does not use oxygen at baseline) Continue incentive spirometry and flutter valve q35min as tolerated Encourage proning, early ambulation, and side laying as tolerated Continue airborne isolation precaution Continue monitoring daily inflammatory markers Patient was empirically started on ceftriaxone and azithromycin,  we shall continue same at this time with plan to de-escalate/discontinue based on blood culture, sputum culture, urine Legionella, strep pneumo and procalcitonin  Hyponatremia possibly due to diuretic effect Na 127, IV hydration was provided Continue to monitor sodium levels  Hypokalemia possibly due to diuretic effect K+ is 3.3 K+ will be replenished Please monitor for AM K+ for further replenishmemnt  CKD stage 3B BUN/creatinine 18/1.33, creatinine was  1.13 on 01/21/2021 Renally adjust medications, avoid nephrotoxic agents/dehydration/hypotension  Elevated troponin possibly secondary to type II demand ischemia Troponin x2 -65 > 64  Chronic systolic CHF Elevated BNP (chronically elevated) BNP 169; this was 213 about 2 years ago Patient does not appear to be fluid overloaded Continue total input/output, daily weights and fluid restriction Continue Cardiac diet  Echocardiogram done in January 2022 showed LVEF of 55 to 60%.  LV has no RWMA. Consider resuming  torsemide when sodium and potassium levels improve  Hyperglycemia secondary to T2DM Continue linagliptin, Levemir, ISS and hypoglycemic protocol  Atrial fibrillation Patient is ventricularly paced Continue Eliquis  Prolonged QTc (517 ms) Patient is ventricularly paced Avoid QT prolonging drugs Magnesium level will be checked Repeat EKG in the morning  Hyperlipidemia  Continue statin  GERD Continue Protonix  Obesity (BMI 35.80 kg/m) Patient counseled on diet and lifestyle modification   DVT prophylaxis: Eliquis  Code Status: Full code  Family Communication: None at bedside  Disposition Plan:  Patient is from:                        home Anticipated DC to:                   SNF or family members home Anticipated DC date:               2-3 days Anticipated DC barriers:         Patient requires inpatient management due to hypoxia requiring supplemental oxygen due to COVID-19 virus pneumonia and possible superimposed CAP POA  Consults called: None  Admission status: Inpatient    Bernadette Hoit MD [facility]  05/07/2021, 1:46 AM

## 2021-05-06 NOTE — ED Provider Notes (Signed)
[facility] EMERGENCY DEPARTMENT Provider Note   CSN: [id number] Arrival date & time: 05/06/21  1937     History  Chief Complaint  Patient presents with   Weakness    Victoria Adams is a 81 y.o. female with a past medical history of CHF, hypertension, CKD, who presents to the ED tonight complaining of generalized weakness onset 5 days.  Patient was evaluated at [facility] ED on 05/01/2021 and at that time was diagnosed with COVID.  She was not admitted to the hospital at that time.  She has been drinking ensures at home to try and maintain fluid intake. Patient has associated shortness of breath, chest pain.  She ambulates with a walker at home at baseline.  She has not tried medications for his symptoms.  She does not wear oxygen at home at baseline.  Denies fever, chills, abdominal pain, nausea, vomiting.  Denies history of DVT/PE, recent surgery, immobilization, malignancy, HRT.  Denies history of asthma or COPD.  She takes Eliquis.  Per patient chart review: Patient was evaluated at [facility] ED on 05/01/2021, at that time she was diagnosed with COVID.  Patient was also given Zofran prescription.  She had a CT scan completed that at the time they were concern for malignancy of the colon.  At that time it was indicated that patient was not a likely candidate for Paxlovid but due to her multiple medications.   The history is provided by the patient. No language interpreter was used.      Home Medications Prior to Admission medications   Medication Sig Start Date End Date Taking? Authorizing Provider  ascorbic acid (VITAMIN C) 1000 MG tablet Take 1,000 mg by mouth daily. 07/20/20 07/20/21  [provider]  atorvastatin (LIPITOR) 80 MG tablet Take by mouth. 11/05/20   [provider]  Calcium Carb-Cholecalciferol (CALCIUM 600 + D PO) Take 1 tablet by mouth 2 (two) times daily.     [provider]  doxycycline (VIBRAMYCIN) 100 MG capsule Take 100-200 mg by  mouth 2 (two) times daily as needed (rosacea).    [provider]  DULoxetine (CYMBALTA) 60 MG capsule Take by mouth. 11/05/20   [provider]  ELIQUIS 5 MG TABS tablet TAKE (1) TABLET TWICE DAILY. 10/04/14   Clegg, Amy D, NP  HYDROcodone-acetaminophen (NORCO) 10-325 MG tablet Take 1 tablet by mouth every 6 (six) hours as needed for moderate pain (To be taken 3 hours after Dilaudid, if Dilaudid is ineffective). 12/22/18   [provider]  Insulin Glargine (BASAGLAR KWIKPEN) 100 UNIT/ML Inject into the skin. 10/11/20   [provider]  insulin glargine (LANTUS) 100 UNIT/ML injection Inject 30 Units into the skin daily. 07/20/20 08/21/21  [provider]  LORazepam (ATIVAN) 0.5 MG tablet Take 0.5 mg by mouth 3 (three) times daily as needed. 03/21/20   [provider]  Magnesium Oxide 400 MG CAPS Take 400 mg by mouth 2 (two) times daily.    [provider]  metolazone (ZAROXOLYN) 2.5 MG tablet TAKE 1 TABLET EVERY TUESDAY AND FRIDAY ALONG WITH THE 40 MEQ OF POTASSIUM. 02/14/20   Larey Dresser, MD  metoprolol succinate (TOPROL-XL) 50 MG 24 hr tablet Take 25 mg by mouth daily. 07/21/20   [provider]  omeprazole (PRILOSEC) 20 MG capsule Take 20 mg by mouth 2 (two) times daily before a meal.    [provider]  ondansetron (ZOFRAN ODT) 4 MG disintegrating tablet Take 1 tablet (4 mg  total) by mouth every 8 (eight) hours as needed for nausea or vomiting. 01/22/19   Evalee Jefferson, PA-C  pantoprazole (PROTONIX) 40 MG tablet Take 40 mg by mouth daily.    [provider]  potassium chloride SA (KLOR-CON M) 20 MEQ tablet Take 3 tablets (60 mEq total) by mouth 2 (two) times daily. NEEDS OFFICE VISIT FOR MORE REFILLS 04/16/21   [name], Shaune Pascal, MD  rosuvastatin (CRESTOR) 20 MG tablet TAKE 1 TABLET BY MOUTH ONCE DAILY. 06/24/20   [name], Shaune Pascal, MD  torsemide (DEMADEX) 20 MG tablet Take 4 tablets (80 mg total) by mouth 2 (two)  times daily. 08/28/20   [name], Shaune Pascal, MD  zinc gluconate 50 MG tablet Take 1 tablet by mouth daily. 07/20/20 07/20/21  [provider]      Allergies    Acetaminophen, Codeine, and Tylox [oxycodone-acetaminophen]    Review of Systems   Review of Systems  Constitutional:  Negative for chills and fever.  Respiratory:  Positive for cough and shortness of breath.   Cardiovascular:  Positive for chest pain.  Gastrointestinal:  Negative for abdominal pain, nausea and vomiting.  Neurological:  Positive for weakness (generalized).  All other systems reviewed and are negative.  Physical Exam Updated Vital Signs BP 138/76    Pulse (!) 101    Temp 99 F (37.2 C) (Oral)    Resp (!) 27    Ht 5\' 6"  (1.676 m)    Wt 106.1 kg    SpO2 99%    BMI 37.75 kg/m  Physical Exam Vitals and nursing note reviewed.  Constitutional:      General: She is not in acute distress.    Appearance: She is ill-appearing. She is not diaphoretic.     Comments: Nasal cannula in place.  HENT:     Head: Normocephalic and atraumatic.     Mouth/Throat:     Pharynx: No oropharyngeal exudate.  Eyes:     General: No visual field deficit or scleral icterus.    Conjunctiva/sclera: Conjunctivae normal.  Cardiovascular:     Rate and Rhythm: Normal rate and regular rhythm.     Pulses: Normal pulses.     Heart sounds: Normal heart sounds.  Pulmonary:     Effort: Pulmonary effort is normal. No respiratory distress.     Breath sounds: Normal breath sounds. No wheezing.  Chest:     Chest wall: No tenderness.  Abdominal:     General: Bowel sounds are normal.     Palpations: Abdomen is soft. There is no mass.     Tenderness: There is no abdominal tenderness. There is no guarding or rebound.  Musculoskeletal:        General: Normal range of motion.     Cervical back: Normal range of motion and neck supple.     Comments: Strength and sensation intact to bilateral upper and lower extremities.  Skin:    General:  Skin is warm and dry.  Neurological:     Mental Status: She is alert.     Sensory: Sensation is intact.     Motor: No pronator drift.     Coordination: Finger-Nose-Finger Test normal.     Comments: Negative pronator drift.  Sensation intact to bilateral upper and lower extremities.  Strength intact to bilateral upper and lower extremities.  Psychiatric:        Behavior: Behavior normal.    ED Results / Procedures / Treatments   Labs (all labs ordered are listed, but only  abnormal results are displayed) Labs Reviewed  RESP PANEL BY RT-PCR (FLU A&B, COVID) ARPGX2 - Abnormal; Notable for the following components:      Result Value   SARS Coronavirus 2 by RT PCR POSITIVE (*)    All other components within normal limits  BASIC METABOLIC PANEL - Abnormal; Notable for the following components:   Sodium 127 (*)    Potassium 3.3 (*)    Chloride 91 (*)    Glucose, Bld 137 (*)    Creatinine, Ser 1.33 (*)    Calcium 7.9 (*)    GFR, Estimated 41 (*)    All other components within normal limits  BRAIN NATRIURETIC PEPTIDE - Abnormal; Notable for the following components:   B Natriuretic Peptide 169.0 (*)    All other components within normal limits  CBC WITH DIFFERENTIAL/PLATELET - Abnormal; Notable for the following components:   Platelets 132 (*)    All other components within normal limits  TROPONIN I (HIGH SENSITIVITY) - Abnormal; Notable for the following components:   Troponin I (High Sensitivity) 65 (*)    All other components within normal limits  TROPONIN I (HIGH SENSITIVITY) - Abnormal; Notable for the following components:   Troponin I (High Sensitivity) 64 (*)    All other components within normal limits  CBC WITH DIFFERENTIAL/PLATELET  COMPREHENSIVE METABOLIC PANEL  C-REACTIVE PROTEIN  D-DIMER, QUANTITATIVE  FERRITIN  MAGNESIUM  PHOSPHORUS    EKG EKG Interpretation  Date/Time:  Wednesday May 06 2021 20:32:12 EST Ventricular Rate:  94 PR Interval:    QRS  Duration: 143 QT Interval:  413 QTC Calculation: 517 R Axis:   244 Text Interpretation: VENTRICULAR PACED RHYTHM Abnormal ECG Confirmed by Carmin Muskrat [phone]) on 05/06/2021 8:36:51 PM  Radiology CT Angio Chest PE W and/or Wo Contrast  Result Date: 05/06/2021 CLINICAL DATA:  Concern for pulmonary embolism. EXAM: CT ANGIOGRAPHY CHEST WITH CONTRAST TECHNIQUE: Multidetector CT imaging of the chest was performed using the standard protocol during bolus administration of intravenous contrast. Multiplanar CT image reconstructions and MIPs were obtained to evaluate the vascular anatomy. RADIATION DOSE REDUCTION: This exam was performed according to the departmental dose-optimization program which includes automated exposure control, adjustment of the mA and/or kV according to patient size and/or use of iterative reconstruction technique. CONTRAST:  65mL OMNIPAQUE IOHEXOL 350 MG/ML SOLN COMPARISON:  Chest CT dated 02/07/2019. FINDINGS: Evaluation of this exam is limited due to respiratory motion artifact. Evaluation is also limited due to streak artifact caused by pacemaker and spinal fusion hardware. Cardiovascular: There is mild cardiomegaly. No pericardial effusion. There is coronary vascular calcification. Cardiac pacemaker wires noted. Mild atherosclerotic calcification of the thoracic aorta. No aneurysmal dilatation or dissection. No pulmonary artery embolus identified. Mediastinum/Nodes: No hilar or mediastinal adenopathy. The esophagus is grossly unremarkable. No mediastinal fluid collection. Lungs/Pleura: Bilateral scattered clusters of ground-glass and nodular densities most consistent with pneumonia, likely viral or atypical in etiology including COVID-19. Clinical correlation is recommended. Probable trace left pleural effusion. No pneumothorax. The central airways are patent. Upper Abdomen: No acute abnormality. Musculoskeletal: Osteopenia with severe degenerative changes of the spine. Partially  visualized posterior fusion hardware. Probable old compression fracture of the T11, progressed since the study of 2020. No definite acute osseous pathology. Review of the MIP images confirms the above findings. IMPRESSION: 1. No CT evidence of pulmonary artery embolus. 2. Multilobar pneumonia, likely viral or atypical in etiology. 3. Aortic Atherosclerosis (ICD10-I70.0). Electronically Signed   By: Anner Crete M.D.   On: 05/06/2021  21:50    Procedures Procedures    Medications Ordered in ED Medications  azithromycin (ZITHROMAX) 500 mg in sodium chloride 0.9 % 250 mL IVPB (has no administration in time range)  dexamethasone (DECADRON) injection 10 mg (has no administration in time range)  molnupiravir EUA (LAGEVRIO) capsule 800 mg (has no administration in time range)  ipratropium (ATROVENT HFA) inhaler 2 puff (has no administration in time range)  methylPREDNISolone sodium succinate (SOLU-MEDROL) 125 mg/2 mL injection 53.125 mg (has no administration in time range)    Followed by  predniSONE (DELTASONE) tablet 50 mg (has no administration in time range)  guaiFENesin-dextromethorphan (ROBITUSSIN DM) 100-10 MG/5ML syrup 10 mL (has no administration in time range)  linagliptin (TRADJENTA) tablet 5 mg (has no administration in time range)  insulin detemir (LEVEMIR) injection 8 Units (has no administration in time range)  insulin aspart (novoLOG) injection 0-9 Units (has no administration in time range)  insulin aspart (novoLOG) injection 3 Units (has no administration in time range)  insulin aspart (novoLOG) injection 0-5 Units (has no administration in time range)  ascorbic acid (VITAMIN C) tablet 500 mg (has no administration in time range)  zinc sulfate capsule 220 mg (has no administration in time range)  pantoprazole (PROTONIX) injection 40 mg (has no administration in time range)  iohexol (OMNIPAQUE) 350 MG/ML injection 75 mL (75 mLs Intravenous Contrast Given 05/06/21 2122)  sodium  chloride 0.9 % bolus 500 mL (500 mLs Intravenous New Bag/Given 05/06/21 2207)  cefTRIAXone (ROCEPHIN) 1 g in sodium chloride 0.9 % 100 mL IVPB (1 g Intravenous New Bag/Given 05/06/21 2236)    ED Course/ Medical Decision Making/ A&P Clinical Course as of 05/06/21 2314  Wed May 06, 2021  2011 Tolerating ice chips at bedside [SB]  2057 Attending evaluated patient and agreeable with admission to the hospital. [SB]  2058 SARS Coronavirus 2 by RT PCR(!): POSITIVE [SB]  2058 Troponin I (High Sensitivity)(!): 65 [SB]  2058 Creatinine(!): 1.33 [SB]  2058 Sodium(!): 127 [SB]  2102 B Natriuretic Peptide(!): 169.0 [SB]  2158 CT PE study without pulmonary embolism.  Notable for multilobar pneumonia. [SB]  2217 Reevaluated and notified patient of imaging findings and lab findings.  Discussed with patient admission to hospital.  Patient agreeable at this time. [SB]  2224 Consult with Dr. Josephine Cables who recommends starting patient on antibiotics, decadron, and molnupiravir at this time. Also agrees with admission.  [SB]    Clinical Course User Index [SB] Claudie Brickhouse A, PA-C                           Medical Decision Making Amount and/or Complexity of Data Reviewed Labs: ordered. Decision-making details documented in ED Course. Radiology: ordered.  Risk Prescription drug management. Decision regarding hospitalization.   Patient presents to the ED with generalized weakness, shortness of breath, chest pain onset 5 days.  Patient was recently diagnosed with COVID on 05/01/2021 at [facility] ED and was not admitted to the hospital.  Patient does not wear oxygen at baseline however is currently on 2 L oxygen via nasal cannula.  Vital signs stable, patient afebrile, not hypoxic, slightly tachypneic at 23.  On exam patient without chest wall tenderness, no acute cardiovascular, respiratory, abdominal exam findings.  Differential diagnosis includes COVID, flu, pneumonia, CHF exacerbation, PE.  Additional  history obtained:  External records from outside source obtained and reviewed including:  Per patient chart review: Patient was evaluated at [facility] ED on 05/01/2021,  at that time she was diagnosed with COVID.  Patient was also given Zofran prescription.  She had a CT scan completed that at the time they were concern for malignancy of the colon.  At that time it was indicated that patient was not a likely candidate for Paxlovid but due to her multiple medications.  EKG: Ventricular paced rhythm.  Labs:  I ordered, and personally interpreted labs.  The pertinent results include: COVID and flu swab, positive for COVID. BNP elevated at 169. Initial troponin elevated at 65. Repeat troponin at 64. CBC unremarkable without leukocytosis. BMP with sodium at 127, potassium at 3.3, creatinine elevated at 1.33.  No acidosis noted.  Imaging: I ordered imaging studies including CT angio chest PE study I independently visualized and interpreted imaging which showed negative for pulmonary embolism, notable for Multi lobar pneumonia. I agree with the radiologist interpretation  Medications:  I ordered medication including IV fluids, azithromycin, Rocephin. Also as per Hospitalist request ordered decadron and molnupiravir. Reevaluation of the patient after these medicines showed that the patient stayed the same I have reviewed the patients home medicines and have made adjustments as needed  Reevaluation: After the interventions noted above, I reevaluated the patient and found that they have :stayed the same   Consultations: I requested consultation with the hospitalist, Dr. Josephine Cables and discussed lab and imaging findings as well as pertinent plan - they recommend: antibiotics, decadron, molnupiravir. Also agrees with admission.   Disposition: Patient presentation suspicious for COVID as well as viral multilobar pneumonia.  After consideration of the diagnostic results and the patients  response to treatment, I feel that the patient would benefit from admission to the hospital due to new oxygen requirement and generalized weakness due to COVID and viral pneumonia.  Case discussed with attending who agrees with admission at this time.  Discussed with patient regarding admission, patient agreeable at this time.  Patient appears safe for admission at this time.   This chart was dictated using voice recognition software, Dragon. Despite the best efforts of this provider to proofread and correct errors, errors may still occur which can change documentation meaning.  Final Clinical Impression(s) / ED Diagnoses Final diagnoses:  Generalized weakness  COVID-19  Community acquired pneumonia, unspecified laterality    Rx / DC Orders ED Discharge Orders     None         Iriana Artley A, PA-C 05/06/21 2314    Carmin Muskrat, MD 05/07/21 1901

## 2021-05-06 NOTE — ED Notes (Addendum)
Floor RN was on break when I called to give report. Will call back in 10 minutes

## 2021-05-07 ENCOUNTER — Encounter (HOSPITAL_COMMUNITY): Payer: Self-pay | Admitting: Internal Medicine

## 2021-05-07 DIAGNOSIS — D696 Thrombocytopenia, unspecified: Secondary | ICD-10-CM

## 2021-05-07 DIAGNOSIS — R778 Other specified abnormalities of plasma proteins: Secondary | ICD-10-CM

## 2021-05-07 DIAGNOSIS — J9601 Acute respiratory failure with hypoxia: Secondary | ICD-10-CM

## 2021-05-07 DIAGNOSIS — E1165 Type 2 diabetes mellitus with hyperglycemia: Secondary | ICD-10-CM

## 2021-05-07 DIAGNOSIS — R7989 Other specified abnormal findings of blood chemistry: Secondary | ICD-10-CM

## 2021-05-07 DIAGNOSIS — E669 Obesity, unspecified: Secondary | ICD-10-CM

## 2021-05-07 LAB — CBC WITH DIFFERENTIAL/PLATELET
Abs Immature Granulocytes: 0.02 10*3/uL (ref 0.00–0.07)
Basophils Absolute: 0 10*3/uL (ref 0.0–0.1)
Basophils Relative: 0 %
Eosinophils Absolute: 0 10*3/uL (ref 0.0–0.5)
Eosinophils Relative: 0 %
HCT: 37.5 % (ref 36.0–46.0)
Hemoglobin: 12.4 g/dL (ref 12.0–15.0)
Immature Granulocytes: 1 %
Lymphocytes Relative: 12 %
Lymphs Abs: 0.4 10*3/uL — ABNORMAL LOW (ref 0.7–4.0)
MCH: 29.8 pg (ref 26.0–34.0)
MCHC: 33.1 g/dL (ref 30.0–36.0)
MCV: 90.1 fL (ref 80.0–100.0)
Monocytes Absolute: 0.1 10*3/uL (ref 0.1–1.0)
Monocytes Relative: 3 %
Neutro Abs: 2.8 10*3/uL (ref 1.7–7.7)
Neutrophils Relative %: 84 %
Platelets: 145 10*3/uL — ABNORMAL LOW (ref 150–400)
RBC: 4.16 MIL/uL (ref 3.87–5.11)
RDW: 13.8 % (ref 11.5–15.5)
WBC: 3.3 10*3/uL — ABNORMAL LOW (ref 4.0–10.5)
nRBC: 0 % (ref 0.0–0.2)

## 2021-05-07 LAB — COMPREHENSIVE METABOLIC PANEL
ALT: 23 U/L (ref 0–44)
AST: 34 U/L (ref 15–41)
Albumin: 2.4 g/dL — ABNORMAL LOW (ref 3.5–5.0)
Alkaline Phosphatase: 87 U/L (ref 38–126)
Anion gap: 13 (ref 5–15)
BUN: 20 mg/dL (ref 8–23)
CO2: 21 mmol/L — ABNORMAL LOW (ref 22–32)
Calcium: 8.1 mg/dL — ABNORMAL LOW (ref 8.9–10.3)
Chloride: 96 mmol/L — ABNORMAL LOW (ref 98–111)
Creatinine, Ser: 1.28 mg/dL — ABNORMAL HIGH (ref 0.44–1.00)
GFR, Estimated: 43 mL/min — ABNORMAL LOW (ref >60)
Glucose, Bld: 284 mg/dL — ABNORMAL HIGH (ref 70–99)
Potassium: 4.5 mmol/L (ref 3.5–5.1)
Sodium: 130 mmol/L — ABNORMAL LOW (ref 135–145)
Total Bilirubin: 0.8 mg/dL (ref 0.3–1.2)
Total Protein: 5.9 g/dL — ABNORMAL LOW (ref 6.5–8.1)

## 2021-05-07 LAB — PROCALCITONIN: Procalcitonin: 0.24 ng/mL

## 2021-05-07 LAB — GLUCOSE, CAPILLARY
Glucose-Capillary: 287 mg/dL — ABNORMAL HIGH (ref 70–99)
Glucose-Capillary: 309 mg/dL — ABNORMAL HIGH (ref 70–99)
Glucose-Capillary: 257 mg/dL — ABNORMAL HIGH (ref 70–99)
Glucose-Capillary: 365 mg/dL — ABNORMAL HIGH (ref 70–99)

## 2021-05-07 LAB — PHOSPHORUS: Phosphorus: 4.2 mg/dL (ref 2.5–4.6)

## 2021-05-07 LAB — C-REACTIVE PROTEIN: CRP: 8.2 mg/dL — ABNORMAL HIGH (ref <1.0)

## 2021-05-07 LAB — MAGNESIUM: Magnesium: 1.7 mg/dL (ref 1.7–2.4)

## 2021-05-07 LAB — FERRITIN: Ferritin: 454 ng/mL — ABNORMAL HIGH (ref 11–307)

## 2021-05-07 LAB — D-DIMER, QUANTITATIVE: D-Dimer, Quant: 2.18 ug/mL-FEU — ABNORMAL HIGH (ref 0.00–0.50)

## 2021-05-07 MED ORDER — INSULIN ASPART 100 UNIT/ML IJ SOLN
0.0000 [IU] | Freq: Every day | INTRAMUSCULAR | Status: DC
  Administered 2021-05-07: 5 [IU] via SUBCUTANEOUS

## 2021-05-07 MED ORDER — SODIUM CHLORIDE 0.9 % IV SOLN
1.0000 g | INTRAVENOUS | Status: DC
  Administered 2021-05-07: 1 g via INTRAVENOUS
  Filled 2021-05-07: qty 10

## 2021-05-07 MED ORDER — APIXABAN 5 MG PO TABS
5.0000 mg | ORAL_TABLET | Freq: Two times a day (BID) | ORAL | Status: DC
  Administered 2021-05-07 – 2021-05-09 (×5): 5 mg via ORAL
  Filled 2021-05-07 (×5): qty 1

## 2021-05-07 MED ORDER — SODIUM CHLORIDE 0.9 % IV SOLN
500.0000 mg | INTRAVENOUS | Status: DC
  Administered 2021-05-07: 500 mg via INTRAVENOUS
  Filled 2021-05-07: qty 5

## 2021-05-07 MED ORDER — ATORVASTATIN CALCIUM 40 MG PO TABS
80.0000 mg | ORAL_TABLET | Freq: Every day | ORAL | Status: DC
  Administered 2021-05-07: 80 mg via ORAL
  Filled 2021-05-07: qty 2

## 2021-05-07 MED ORDER — INSULIN ASPART 100 UNIT/ML IJ SOLN
0.0000 [IU] | Freq: Three times a day (TID) | INTRAMUSCULAR | Status: DC
  Administered 2021-05-08: 11 [IU] via SUBCUTANEOUS
  Administered 2021-05-08: 7 [IU] via SUBCUTANEOUS
  Administered 2021-05-08: 15 [IU] via SUBCUTANEOUS
  Administered 2021-05-09 (×2): 7 [IU] via SUBCUTANEOUS

## 2021-05-07 MED ORDER — POTASSIUM CHLORIDE CRYS ER 20 MEQ PO TBCR
40.0000 meq | EXTENDED_RELEASE_TABLET | Freq: Once | ORAL | Status: AC
  Administered 2021-05-07: 40 meq via ORAL
  Filled 2021-05-07: qty 2

## 2021-05-07 MED ORDER — INSULIN ASPART 100 UNIT/ML IJ SOLN
3.0000 [IU] | Freq: Three times a day (TID) | INTRAMUSCULAR | Status: DC
  Administered 2021-05-08 – 2021-05-09 (×3): 3 [IU] via SUBCUTANEOUS

## 2021-05-07 MED ORDER — DM-GUAIFENESIN ER 30-600 MG PO TB12
1.0000 | ORAL_TABLET | Freq: Two times a day (BID) | ORAL | Status: DC
  Administered 2021-05-07 – 2021-05-09 (×6): 1 via ORAL
  Filled 2021-05-07 (×6): qty 1

## 2021-05-07 MED ORDER — INSULIN DETEMIR 100 UNIT/ML ~~LOC~~ SOLN
12.0000 [IU] | Freq: Two times a day (BID) | SUBCUTANEOUS | Status: DC
  Administered 2021-05-07: 12 [IU] via SUBCUTANEOUS
  Filled 2021-05-07 (×4): qty 0.12

## 2021-05-07 MED ORDER — METHYLPREDNISOLONE SODIUM SUCC 125 MG IJ SOLR
60.0000 mg | Freq: Two times a day (BID) | INTRAMUSCULAR | Status: DC
  Administered 2021-05-07 – 2021-05-09 (×4): 60 mg via INTRAVENOUS
  Filled 2021-05-07 (×4): qty 2

## 2021-05-07 NOTE — Progress Notes (Addendum)
PROGRESS NOTE     Victoria Adams, is a 81 y.o. female, DOB - 1940-10-29, NFA:[id number]  Admit date - 05/06/2021   Admitting Physician Bernadette Hoit, DO  Outpatient Primary MD for the patient is Practice, [facility]  LOS - 1  Chief Complaint  Patient presents with   Weakness        Brief Narrative:  81 y.o. female with medical history significant for HFrEF, T2DM, CKD stage 3B, hyperlipidemia, A. fib with controlled ventricular rate, GERD, CAD, obesity admitted on 05/06/2021 with acute hypoxic respiratory failure secondary to COVID-19 pneumonia  Assessment & Plan:   Principal Problem:   Pneumonia due to COVID-19 virus Active Problems:   Hyperlipidemia, unspecified   Hypokalemia   Atrial fibrillation ([place])   Chronic systolic heart failure ([place])   CKD (chronic kidney disease), stage III ([place])   Hyponatremia   Elevated troponin   Elevated brain natriuretic peptide (BNP) level   Thrombocytopenia ([place])   Hyperglycemia due to diabetes mellitus ([place])   Obesity (BMI 30-39.9)   Acute respiratory failure with hypoxia (HCC)   1)Acute hypoxic respiratory failure secondary to COVID-19 pneumonia- -Patient is unvaccinated -Continue to wean down oxygen currently down to 3 L of oxygen via nasal cannula =-Continue IV Solu-Medrol, -c/n Molnupiravir -Continue zinc, vitamin C and bronchodilators and supplemental oxygen -Stop Rocephin/azithromycin  2)DM2-no recent A1c, -Patient with worsening hyperglycemia due to steroids, -Hold linagliptin -Levemir to 12 units qhs Use Novolog/Humalog Sliding scale insulin with Accu-Cheks/Fingersticks as ordered   3) chronic atrial fibrillation --- continue Eliquis for stroke prophylaxis  4)HypoNatremia--avoid dehydration  GERD--continue Protonix especially while on high-dose steroids  HLD/CAD--- hold atorvastatin  Disposition/Need for in-Hospital Stay- patient unable to be discharged at this time due to acute hypoxic respiratory failure  due to COVID-19 infection requiring supplemental oxygen and IV steroids and bronchodilators*  Status is: Inpatient  Remains inpatient appropriate because:   Disposition: The patient is from: Home              Anticipated d/c is to: Home              Anticipated d/c date is: 2 days              Patient currently is not medically stable to d/c. Barriers: Not Clinically Stable-   Code Status :  -  Code Status: Full Code   Family Communication:    (patient is alert, awake and coherent)  -Updated patient's daughter Beckie Busing by phone  Consults  :  na  DVT Prophylaxis  :   - SCDs   SCDs Start: 05/06/21 2252 apixaban (ELIQUIS) tablet 5 mg    Lab Results  Component Value Date   PLT 145 (L) 05/07/2021    Inpatient Medications  Scheduled Meds:  apixaban  5 mg Oral BID   vitamin C  500 mg Oral Daily   atorvastatin  80 mg Oral Daily   dextromethorphan-guaiFENesin  1 tablet Oral BID   insulin aspart  0-5 Units Subcutaneous QHS   insulin aspart  0-9 Units Subcutaneous TID WC   insulin aspart  3 Units Subcutaneous TID WC   insulin detemir  0.075 Units/kg Subcutaneous BID   ipratropium  2 puff Inhalation Q6H   linagliptin  5 mg Oral Daily   methylPREDNISolone (SOLU-MEDROL) injection  60 mg Intravenous Q12H   molnupiravir EUA  4 capsule Oral BID   pantoprazole (PROTONIX) IV  40 mg Intravenous QHS   zinc sulfate  220 mg Oral Daily  Continuous Infusions: PRN Meds:.guaiFENesin-dextromethorphan   Anti-infectives (From admission, onward)    Start     Dose/Rate Route Frequency Ordered Stop   05/07/21 1000  cefTRIAXone (ROCEPHIN) 1 g in sodium chloride 0.9 % 100 mL IVPB  Status:  Discontinued        1 g 200 mL/hr over 30 Minutes Intravenous Every 24 hours 05/07/21 0213 05/07/21 1802   05/07/21 1000  azithromycin (ZITHROMAX) 500 mg in sodium chloride 0.9 % 250 mL IVPB  Status:  Discontinued        500 mg 250 mL/hr over 60 Minutes Intravenous Every 24 hours 05/07/21 0213 05/07/21 1802    05/06/21 2245  molnupiravir EUA (LAGEVRIO) capsule 800 mg        4 capsule Oral 2 times daily 05/06/21 2232 05/11/21 2159   05/06/21 2230  cefTRIAXone (ROCEPHIN) 1 g in sodium chloride 0.9 % 100 mL IVPB        1 g 200 mL/hr over 30 Minutes Intravenous  Once 05/06/21 2215 05/06/21 2325   05/06/21 2230  azithromycin (ZITHROMAX) 500 mg in sodium chloride 0.9 % 250 mL IVPB        500 mg 250 mL/hr over 60 Minutes Intravenous  Once 05/06/21 2215 05/07/21 0039         Subjective: Sharren Schermer today has no fevers, no emesis,  No chest pain,   Cough and dyspnea is not worse -Oral intake is fair -Oxygen requirement improving   Objective: Vitals:   05/07/21 0822 05/07/21 1000 05/07/21 1405 05/07/21 1418  BP:  120/68  (!) 107/53  Pulse:  90  88  Resp:  19  18  Temp:  97.9 F (36.6 C)  97.8 F (36.6 C)  TempSrc:  Oral    SpO2: 98% 96% 97% 93%  Weight:      Height:        Intake/Output Summary (Last 24 hours) at 05/07/2021 1829 Last data filed at 05/07/2021 1500 Gross per 24 hour  Intake 1190.08 ml  Output 1100 ml  Net 90.08 ml   Filed Weights   05/06/21 1941 05/07/21 0037 05/07/21 0500  Weight: 106.1 kg 100.6 kg 100.6 kg     Physical Exam  Gen:- Awake Alert, speaking short sentences HEENT:- [place].AT, No sclera icterus Nose- [place] 3 L/min Neck-Supple Neck,No JVD,.  Lungs-diminished breath sounds with scattered rhonchi  CV- S1, S2 normal, irregularly irregular Abd-  +ve B.Sounds, Abd Soft, No tenderness,    Extremity/Skin:- No  edema, pedal pulses present  Psych-affect is appropriate, oriented x3, a lot more coherent Neuro-no new focal deficits, no tremors  Data Reviewed: I have personally reviewed following labs and imaging studies  CBC: Recent Labs  Lab 05/06/21 1945 05/07/21 0624  WBC 4.7 3.3*  NEUTROABS 2.8 2.8  HGB 13.0 12.4  HCT 38.8 37.5  MCV 89.2 90.1  PLT 132* 998*   Basic Metabolic Panel: Recent Labs  Lab 05/06/21 1945 05/07/21 0624  NA 127* 130*   K 3.3* 4.5  CL 91* 96*  CO2 27 21*  GLUCOSE 137* 284*  BUN 18 20  CREATININE 1.33* 1.28*  CALCIUM 7.9* 8.1*  MG  --  1.7  PHOS  --  4.2   GFR: Estimated Creatinine Clearance: 42.6 mL/min (A) (by C-G formula based on SCr of 1.28 mg/dL (H)). Liver Function Tests: Recent Labs  Lab 05/07/21 0624  AST 34  ALT 23  ALKPHOS 87  BILITOT 0.8  PROT 5.9*  ALBUMIN 2.4*   No results for  input(s): LIPASE, AMYLASE in the last 168 hours. No results for input(s): AMMONIA in the last 168 hours. Coagulation Profile: No results for input(s): INR, PROTIME in the last 168 hours. Cardiac Enzymes: No results for input(s): CKTOTAL, CKMB, CKMBINDEX, TROPONINI in the last 168 hours. BNP (last 3 results) Recent Labs    07/31/20 1649  PROBNP 2,985*   HbA1C: No results for input(s): HGBA1C in the last 72 hours. CBG: Recent Labs  Lab 05/06/21 2320 05/07/21 0738 05/07/21 1102 05/07/21 1639  GLUCAP 134* 287* 309* 257*   Lipid Profile: No results for input(s): CHOL, HDL, LDLCALC, TRIG, CHOLHDL, LDLDIRECT in the last 72 hours. Thyroid Function Tests: No results for input(s): TSH, T4TOTAL, FREET4, T3FREE, THYROIDAB in the last 72 hours. Anemia Panel: Recent Labs    05/07/21 0624  FERRITIN 454*   Urine analysis:    Component Value Date/Time   COLORURINE YELLOW 06/13/2016 1601   APPEARANCEUR CLEAR 06/13/2016 1601   LABSPEC 1.010 06/13/2016 1601   PHURINE 5.0 06/13/2016 1601   GLUCOSEU 150 (A) 06/13/2016 1601   HGBUR NEGATIVE 06/13/2016 1601   BILIRUBINUR NEGATIVE 06/13/2016 1601   KETONESUR NEGATIVE 06/13/2016 1601   PROTEINUR NEGATIVE 06/13/2016 1601   UROBILINOGEN 1.0 01/10/2014 1545   NITRITE NEGATIVE 06/13/2016 1601   LEUKOCYTESUR NEGATIVE 06/13/2016 1601   Sepsis Labs: @LABRCNTIP (procalcitonin:4,lacticidven:4)  ) Recent Results (from the past 240 hour(s))  Resp Panel by RT-PCR (Flu A&B, Covid) Nasopharyngeal Swab     Status: Abnormal   Collection Time: 05/06/21  7:45 PM    Specimen: Nasopharyngeal Swab; Nasopharyngeal(NP) swabs in vial transport medium  Result Value Ref Range Status   SARS Coronavirus 2 by RT PCR POSITIVE (A) NEGATIVE Final    Comment: (NOTE) SARS-CoV-2 target nucleic acids are DETECTED.  The SARS-CoV-2 RNA is generally detectable in upper respiratory specimens during the acute phase of infection. Positive results are indicative of the presence of the identified virus, but do not rule out bacterial infection or co-infection with other pathogens not detected by the test. Clinical correlation with patient history and other diagnostic information is necessary to determine patient infection status. The expected result is Negative.  Fact Sheet for Patients: EntrepreneurPulse.com.au  Fact Sheet for Healthcare Providers: IncredibleEmployment.be  This test is not yet approved or cleared by the [place] FDA and  has been authorized for detection and/or diagnosis of SARS-CoV-2 by FDA under an Emergency Use Authorization (EUA).  This EUA will remain in effect (meaning this test can be used) for the duration of  the COVID-19 declaration under Section 564(b)(1) of the A ct, 21 U.S.C. section 360bbb-3(b)(1), unless the authorization is terminated or revoked sooner.     Influenza A by PCR NEGATIVE NEGATIVE Final   Influenza B by PCR NEGATIVE NEGATIVE Final    Comment: (NOTE) The Xpert Xpress SARS-CoV-2/FLU/RSV plus assay is intended as an aid in the diagnosis of influenza from Nasopharyngeal swab specimens and should not be used as a sole basis for treatment. Nasal washings and aspirates are unacceptable for Xpert Xpress SARS-CoV-2/FLU/RSV testing.  Fact Sheet for Patients: EntrepreneurPulse.com.au  Fact Sheet for Healthcare Providers: IncredibleEmployment.be  This test is not yet approved or cleared by the [place] FDA and has been authorized for detection  and/or diagnosis of SARS-CoV-2 by FDA under an Emergency Use Authorization (EUA). This EUA will remain in effect (meaning this test can be used) for the duration of the COVID-19 declaration under Section 564(b)(1) of the Act, 21 U.S.C. section 360bbb-3(b)(1), unless the authorization is  terminated or revoked.  Performed at [facility], [address] [place], [place] [address]       Radiology Studies: CT Angio Chest PE W and/or Wo Contrast  Result Date: 05/06/2021 CLINICAL DATA:  Concern for pulmonary embolism. EXAM: CT ANGIOGRAPHY CHEST WITH CONTRAST TECHNIQUE: Multidetector CT imaging of the chest was performed using the standard protocol during bolus administration of intravenous contrast. Multiplanar CT image reconstructions and MIPs were obtained to evaluate the vascular anatomy. RADIATION DOSE REDUCTION: This exam was performed according to the departmental dose-optimization program which includes automated exposure control, adjustment of the mA and/or kV according to patient size and/or use of iterative reconstruction technique. CONTRAST:  54mL OMNIPAQUE IOHEXOL 350 MG/ML SOLN COMPARISON:  Chest CT dated 02/07/2019. FINDINGS: Evaluation of this exam is limited due to respiratory motion artifact. Evaluation is also limited due to streak artifact caused by pacemaker and spinal fusion hardware. Cardiovascular: There is mild cardiomegaly. No pericardial effusion. There is coronary vascular calcification. Cardiac pacemaker wires noted. Mild atherosclerotic calcification of the thoracic aorta. No aneurysmal dilatation or dissection. No pulmonary artery embolus identified. Mediastinum/Nodes: No hilar or mediastinal adenopathy. The esophagus is grossly unremarkable. No mediastinal fluid collection. Lungs/Pleura: Bilateral scattered clusters of ground-glass and nodular densities most consistent with pneumonia, likely viral or atypical in etiology including COVID-19. Clinical correlation is recommended.  Probable trace left pleural effusion. No pneumothorax. The central airways are patent. Upper Abdomen: No acute abnormality. Musculoskeletal: Osteopenia with severe degenerative changes of the spine. Partially visualized posterior fusion hardware. Probable old compression fracture of the T11, progressed since the study of 2020. No definite acute osseous pathology. Review of the MIP images confirms the above findings. IMPRESSION: 1. No CT evidence of pulmonary artery embolus. 2. Multilobar pneumonia, likely viral or atypical in etiology. 3. Aortic Atherosclerosis (ICD10-I70.0). Electronically Signed   By: Anner Crete M.D.   On: 05/06/2021 21:50     Scheduled Meds:  apixaban  5 mg Oral BID   vitamin C  500 mg Oral Daily   atorvastatin  80 mg Oral Daily   dextromethorphan-guaiFENesin  1 tablet Oral BID   insulin aspart  0-5 Units Subcutaneous QHS   insulin aspart  0-9 Units Subcutaneous TID WC   insulin aspart  3 Units Subcutaneous TID WC   insulin detemir  0.075 Units/kg Subcutaneous BID   ipratropium  2 puff Inhalation Q6H   linagliptin  5 mg Oral Daily   methylPREDNISolone (SOLU-MEDROL) injection  60 mg Intravenous Q12H   molnupiravir EUA  4 capsule Oral BID   pantoprazole (PROTONIX) IV  40 mg Intravenous QHS   zinc sulfate  220 mg Oral Daily   Continuous Infusions:   LOS: 1 day    Roxan Hockey M.D on 05/07/2021 at 6:29 PM  Go to [email or web address] - for contact info  [facility] - Office  [phone]  If 7PM-7AM, please contact night-coverage [email or web address] Password [facility] 05/07/2021, 6:29 PM

## 2021-05-07 NOTE — Plan of Care (Signed)
Pt resting at this time, verbally states she is starting to feel better than she has been. Alert and oriented x 3.

## 2021-05-07 NOTE — Progress Notes (Signed)
°  Transition of Care (TOC) Screening Note   Patient Details  Name: Victoria Adams Date of Birth: Sep 16, 1940   Transition of [facility]) CM/SW Contact:    Boneta Lucks, RN Phone Number: 05/07/2021, 10:26 AM    Transition of Care Department [facility]) has reviewed patient and no TOC needs have been identified at this time. We will continue to monitor patient advancement through interdisciplinary progression rounds. If new patient transition needs arise, please place a TOC consult.

## 2021-05-07 NOTE — ED Notes (Signed)
As soon as entered pt room to take to the floor. Pt was sitting on the end of the bed complaining that she was "burning up and needed fresh air". This RN fanned her which provided some relief to pt. Pt O2 was 90% so turned up O2 to 6L until pt recovered. Returned pt to 4L [place]. Pt left calm knowing she was leaving her "hot room"

## 2021-05-08 LAB — CBC WITH DIFFERENTIAL/PLATELET
Abs Immature Granulocytes: 0.03 10*3/uL (ref 0.00–0.07)
Basophils Absolute: 0 10*3/uL (ref 0.0–0.1)
Basophils Relative: 1 %
Eosinophils Absolute: 0 10*3/uL (ref 0.0–0.5)
Eosinophils Relative: 0 %
HCT: 38.2 % (ref 36.0–46.0)
Hemoglobin: 12.4 g/dL (ref 12.0–15.0)
Immature Granulocytes: 1 %
Lymphocytes Relative: 19 %
Lymphs Abs: 0.7 10*3/uL (ref 0.7–4.0)
MCH: 29.5 pg (ref 26.0–34.0)
MCHC: 32.5 g/dL (ref 30.0–36.0)
MCV: 91 fL (ref 80.0–100.0)
Monocytes Absolute: 0.3 10*3/uL (ref 0.1–1.0)
Monocytes Relative: 8 %
Neutro Abs: 2.8 10*3/uL (ref 1.7–7.7)
Neutrophils Relative %: 71 %
Platelets: 170 10*3/uL (ref 150–400)
RBC: 4.2 MIL/uL (ref 3.87–5.11)
RDW: 13.8 % (ref 11.5–15.5)
WBC: 3.8 10*3/uL — ABNORMAL LOW (ref 4.0–10.5)
nRBC: 0 % (ref 0.0–0.2)

## 2021-05-08 LAB — COMPREHENSIVE METABOLIC PANEL
ALT: 26 U/L (ref 0–44)
AST: 37 U/L (ref 15–41)
Albumin: 2.5 g/dL — ABNORMAL LOW (ref 3.5–5.0)
Alkaline Phosphatase: 90 U/L (ref 38–126)
Anion gap: 10 (ref 5–15)
BUN: 29 mg/dL — ABNORMAL HIGH (ref 8–23)
CO2: 25 mmol/L (ref 22–32)
Calcium: 8.4 mg/dL — ABNORMAL LOW (ref 8.9–10.3)
Chloride: 97 mmol/L — ABNORMAL LOW (ref 98–111)
Creatinine, Ser: 1.29 mg/dL — ABNORMAL HIGH (ref 0.44–1.00)
GFR, Estimated: 42 mL/min — ABNORMAL LOW (ref >60)
Glucose, Bld: 319 mg/dL — ABNORMAL HIGH (ref 70–99)
Potassium: 4.3 mmol/L (ref 3.5–5.1)
Sodium: 132 mmol/L — ABNORMAL LOW (ref 135–145)
Total Bilirubin: 0.4 mg/dL (ref 0.3–1.2)
Total Protein: 6.3 g/dL — ABNORMAL LOW (ref 6.5–8.1)

## 2021-05-08 LAB — GLUCOSE, CAPILLARY
Glucose-Capillary: 291 mg/dL — ABNORMAL HIGH (ref 70–99)
Glucose-Capillary: 306 mg/dL — ABNORMAL HIGH (ref 70–99)
Glucose-Capillary: 240 mg/dL — ABNORMAL HIGH (ref 70–99)
Glucose-Capillary: 137 mg/dL — ABNORMAL HIGH (ref 70–99)

## 2021-05-08 LAB — FERRITIN: Ferritin: 418 ng/mL — ABNORMAL HIGH (ref 11–307)

## 2021-05-08 LAB — MAGNESIUM: Magnesium: 1.9 mg/dL (ref 1.7–2.4)

## 2021-05-08 LAB — D-DIMER, QUANTITATIVE: D-Dimer, Quant: 2.37 ug/mL-FEU — ABNORMAL HIGH (ref 0.00–0.50)

## 2021-05-08 LAB — PHOSPHORUS: Phosphorus: 3.2 mg/dL (ref 2.5–4.6)

## 2021-05-08 LAB — C-REACTIVE PROTEIN: CRP: 6.3 mg/dL — ABNORMAL HIGH (ref <1.0)

## 2021-05-08 MED ORDER — INSULIN DETEMIR 100 UNIT/ML ~~LOC~~ SOLN
15.0000 [IU] | Freq: Two times a day (BID) | SUBCUTANEOUS | Status: DC
  Administered 2021-05-08: 15 [IU] via SUBCUTANEOUS
  Filled 2021-05-08 (×3): qty 0.15

## 2021-05-08 MED ORDER — INSULIN DETEMIR 100 UNIT/ML ~~LOC~~ SOLN
20.0000 [IU] | Freq: Two times a day (BID) | SUBCUTANEOUS | Status: DC
  Administered 2021-05-08 – 2021-05-09 (×2): 20 [IU] via SUBCUTANEOUS
  Filled 2021-05-08 (×4): qty 0.2

## 2021-05-08 NOTE — TOC Initial Note (Signed)
Transition of Care [facility]) - Initial/Assessment Note    Patient Details  Name: Victoria Adams MRN: [id number] Date of Birth: Jun 07, 1940  Transition [facility]) CM/SW Contact:    Victoria Lucks, RN Phone Number: 05/08/2021, 11:20 AM  Clinical Narrative:    Patient admitted with pneumonia Covid positive. TOC spoke with daughter, Son lives with patient. Victoria Adams lives 3 houses down, stays there at times, and fixing meals and medications. She walks with a walker, has a lift chair, 3N1. They take her to all appointments.  Patient will let them do everything, so they have backed off to allow her to do more for herself. Patient has used [facility] in the past. She will need HHPT/OT/RN at discharge. Victoria Adams will accept the referral.   MD aware to order.  Patient is on oxygen, we are weaning, Family is okay to refer to [facility] if needed.    Expected Discharge Plan: [place] Barriers to Discharge: Continued Medical Work up   Patient Goals and CMS Choice Patient states their goals for this hospitalization and ongoing recovery are:: to go home. CMS Medicare.gov Compare Post Acute Care list provided to:: Patient Represenative (must comment) Choice offered to / list presented to : Adult Children  Expected Discharge Plan and Services Expected Discharge Plan: [place] Choice: [place] arrangements for the past 2 months: [place] Arranged: RN, PT, OT [facility] Agency: [place] ([place]) Date [place]: 05/08/21 Time [place]: 1119 Representative spoke with at [place]: Victoria Adams Arrangements/Services Living arrangements for the past 2 months: [place] Lives with:: Adult Children Patient language and need for interpreter reviewed:: Yes        Need for Family Participation in Patient Care: Yes (Comment) Care giver support system in place?: Yes  (comment) Current home services: DME Criminal Activity/Legal Involvement Pertinent to Current Situation/Hospitalization: No - Comment as needed  Activities of Daily Living Home Assistive Devices/Equipment: None ADL Screening (condition at time of admission) Patient's cognitive ability adequate to safely complete daily activities?: Yes Is the patient deaf or have difficulty hearing?: No Does the patient have difficulty seeing, even when wearing glasses/contacts?: No Does the patient have difficulty concentrating, remembering, or making decisions?: No Patient able to express need for assistance with ADLs?: Yes Does the patient have difficulty dressing or bathing?: No Independently performs ADLs?: Yes (appropriate for developmental age) Does the patient have difficulty walking or climbing stairs?: Yes Weakness of Legs: Both Weakness of Arms/Hands: None  Permission Sought/Granted       Permission granted to share info w Relationship: Victoria Adams Daughter    Emotional Assessment     Orientation: : Oriented to Self, Oriented to Situation, Oriented to Place Alcohol / Substance Use: Not Applicable Psych Involvement: No (comment)  Admission diagnosis:  Generalized weakness [R53.1] Community acquired pneumonia, unspecified laterality [J18.9] Pneumonia due to COVID-19 virus [U07.1, J12.82] COVID-19 [U07.1] Patient Active Problem List   Diagnosis Date Noted   Elevated troponin 05/07/2021   Elevated brain natriuretic peptide (BNP) level 05/07/2021   Thrombocytopenia ([place]) 05/07/2021   Hyperglycemia due to diabetes mellitus ([place]) 05/07/2021   Obesity (BMI 30-39.9) 05/07/2021   Acute respiratory failure with hypoxia ([place]) 05/07/2021   Pneumonia due to COVID-19 virus 05/06/2021   C. difficile colitis 01/22/2021   Back pain 02/08/2019   Thoracic compression  fracture ([place]) 02/07/2019   Dyspnea 02/07/2019   Right foot pain    Hyponatremia 06/12/2016   Flu-like symptoms 06/12/2016   Acute  kidney injury superimposed on CKD ([place]) 06/12/2016   Weakness generalized 06/12/2016   Lactic acid acidosis 06/12/2016   Type 2 diabetes mellitus with complication ([place])    Falls 09/13/2014   CKD (chronic kidney disease), stage III ([place]) 09/13/2014   Benign neoplasm of colon 12/20/2013   Hyperkalemia 10/03/2013   Orthostatic hypotension 10/03/2013   IDA (iron deficiency anemia) 08/24/2013   NSTEMI (non-ST elevated myocardial infarction) ([place]) 08/22/2013   Long term (current) use of anticoagulants 07/20/2010   NICM (nonischemic cardiomyopathy) ([place]) 10/28/2009   LBBB (left bundle branch block) 10/28/2009   Atrial fibrillation ([place]) 10/28/2009   Hyperlipidemia, unspecified 06/06/2009   Hypokalemia 06/06/2009   Morbid obesity ([place]) 06/06/2009   Essential hypertension, benign 06/06/2009   Atherosclerosis of native coronary artery 82/95/6213   Chronic systolic heart failure ([place]) 06/06/2009   PCP:  Practice, [place]:   [place], [place] [address] [place] [address] Phone: [phone] Fax: [phone]  Readmission Risk Interventions No flowsheet data found.

## 2021-05-08 NOTE — Progress Notes (Signed)
Inpatient Diabetes Program Recommendations  AACE/ADA: New Consensus Statement on Inpatient Glycemic Control (2015)  Target Ranges:  Prepandial:   less than 140 mg/dL      Peak postprandial:   less than 180 mg/dL (1-2 hours)      Critically ill patients:  140 - 180 mg/dL   Lab Results  Component Value Date   GLUCAP 291 (H) 05/08/2021   HGBA1C 12.6 (H) 06/14/2016    Review of Glycemic Control  Latest Reference Range & Units 05/07/21 16:39 05/07/21 21:23 05/08/21 07:37  Glucose-Capillary 70 - 99 mg/dL 257 (H) 365 (H) 291 (H)   Diabetes history: DM 2 Outpatient Diabetes medications:  Lantus 30 units daily Current orders for Inpatient glycemic control:  Levemir 15 units bid Novolog 3 units tid with meals Tradjenta 5 mg daily Novolog resistant tid with meals and HS Solumedrol 60 mg bid Inpatient Diabetes Program Recommendations:   Consider further increase of Levemir to 20 units bid and increase Novolog meal coverage to 5 units tid with meals.    Thanks,  Adah Perl, RN, BC-ADM Inpatient Diabetes Coordinator Pager [phone]  (8a-5p)

## 2021-05-08 NOTE — Plan of Care (Signed)
°  Problem: Acute Rehab PT Goals(only PT should resolve) Goal: Patient Will Transfer Sit To/From Stand Outcome: Progressing Flowsheets (Taken 05/08/2021 1408) Patient will transfer sit to/from stand:  with supervision  with min guard assist Goal: Pt Will Transfer Bed To Chair/Chair To Bed Outcome: Progressing Flowsheets (Taken 05/08/2021 1408) Pt will Transfer Bed to Chair/Chair to Bed: min guard assist Goal: Pt Will Ambulate Outcome: Progressing Flowsheets (Taken 05/08/2021 1408) Pt will Ambulate:  50 feet  with min guard assist  with least restrictive assistive device Goal: Pt/caregiver will Perform Home Exercise Program Outcome: Progressing Flowsheets (Taken 05/08/2021 1408) Pt/caregiver will Perform Home Exercise Program:  For increased strengthening  For improved balance  Independently  2:08 PM, 05/08/21 Mearl Latin PT, DPT Physical Therapist at [facility]

## 2021-05-08 NOTE — Evaluation (Signed)
Physical Therapy Evaluation Patient Details Name: Victoria Adams MRN: [id number] DOB: 10-29-1940 Today's Date: 05/08/2021  History of Present Illness  Ary A Hammonds is a 81 y.o. female with medical history significant for HFrEF, T2DM, CKD stage 3B, hyperlipidemia, A. fib with controlled ventricular rate, GERD, CAD, obesity who presents to the emergency department due to 5-day onset of nonproductive cough associated with chest pain, nasal and chest congestion, generalized weakness.  Patient was diagnosed to have COVID-19 virus at [facility] ED on 05/01/2021, she was deemed to not qualify for Paxlovid and was discharged home.  Patient complained of worsening weakness, generalized body aches, decreased appetite and increasing shortness of breath.  She lives at home with son who has not tested for COVID, patient states that she never took COVID-vaccine or booster, she ambulates with a walker at baseline and she does not use oxygen at baseline.  She denies fever, chills, nausea, vomiting, diarrhea or constipation.   Clinical Impression  Patient limited for functional mobility as stated below secondary to BLE weakness, fatigue and impaired standing balance. Patient does not require assist with bed mobility. Patient requires min assist with transfers and ambulation along with RW due to impaired strength and balance. Patient ambulates with RW to commode where she demonstrates good sitting balance. Patient assisted to chair at bedside at end of session. She is overall limited by fatigue and impaired activity tolerance. Patient will benefit from continued physical therapy in hospital and recommended venue below to increase strength, balance, endurance for safe ADLs and gait.        Recommendations for follow up therapy are one component of a multi-disciplinary discharge planning process, led by the attending physician.  Recommendations may be updated based on patient status, additional functional criteria and  insurance authorization.  Follow Up Recommendations Skilled nursing-short term rehab (<3 hours/day)    Assistance Recommended at Discharge Intermittent Supervision/Assistance  Patient can return home with the following  A lot of help with walking and/or transfers;A lot of help with bathing/dressing/bathroom;Assistance with cooking/housework;Help with stairs or ramp for entrance    Equipment Recommendations None recommended by PT  Recommendations for Other Services       Functional Status Assessment Patient has had a recent decline in their functional status and demonstrates the ability to make significant improvements in function in a reasonable and predictable amount of time.     Precautions / Restrictions Precautions Precautions: Fall Restrictions Weight Bearing Restrictions: No      Mobility  Bed Mobility Overal bed mobility: Modified Independent             General bed mobility comments: slow, labored, HOB elevated    Transfers Overall transfer level: Needs assistance Equipment used: Rolling walker (2 wheels) Transfers: Sit to/from Stand, Bed to chair/wheelchair/BSC Sit to Stand: Min assist Stand pivot transfers: Min assist         General transfer comment: assist to power up    Ambulation/Gait Ambulation/Gait assistance: Min assist Gait Distance (Feet): 20 Feet Assistive device: Rolling walker (2 wheels) Gait Pattern/deviations: Step-through pattern, Decreased stride length Gait velocity: decreased     General Gait Details: slow, labored, slightly unsteady with use of RW  Stairs            Wheelchair Mobility    Modified Rankin (Stroke Patients Only)       Balance Overall balance assessment: Needs assistance Sitting-balance support: No upper extremity supported, Feet supported Sitting balance-Leahy Scale: Good Sitting balance - Comments: seated EOB  Standing balance support: Bilateral upper extremity supported, Reliant on assistive  device for balance Standing balance-Leahy Scale: Fair                               Pertinent Vitals/Pain Pain Assessment Pain Assessment: Faces Faces Pain Scale: Hurts little more Pain Location: in abdomen with coughing Pain Descriptors / Indicators: Sore Pain Intervention(s): Limited activity within patient's tolerance, Monitored during session    Home Living Family/patient expects to be discharged to:: Private residence Living Arrangements: Children Available Help at Discharge: Family;Available 24 hours/day Type of Home: House Home Access: Stairs to enter Entrance Stairs-Rails: Psychiatric nurse of Steps: 6   Home Layout: One level Home Equipment: Conservation officer, nature (2 wheels);Rollator (4 wheels);Cane - single point;BSC/3in1;Other (comment) (lift chair)      Prior Function Prior Level of Function : Needs assist             Mobility Comments: household ambulation with rollator mostly ADLs Comments: family assists PRN     Hand Dominance        Extremity/Trunk Assessment   Upper Extremity Assessment Upper Extremity Assessment: Generalized weakness    Lower Extremity Assessment Lower Extremity Assessment: Generalized weakness    Cervical / Trunk Assessment Cervical / Trunk Assessment: Normal  Communication   Communication: No difficulties  Cognition Arousal/Alertness: Awake/alert Behavior During Therapy: WFL for tasks assessed/performed Overall Cognitive Status: Within Functional Limits for tasks assessed                                          General Comments      Exercises     Assessment/Plan    PT Assessment Patient needs continued PT services  PT Problem List Decreased strength;Decreased mobility;Decreased activity tolerance;Decreased balance;Cardiopulmonary status limiting activity       PT Treatment Interventions DME instruction;Therapeutic exercise;Gait training;Balance training;Stair  training;Neuromuscular re-education;Functional mobility training;Therapeutic activities;Patient/family education    PT Goals (Current goals can be found in the Care Plan section)  Acute Rehab PT Goals Patient Stated Goal: get better PT Goal Formulation: With patient Time For Goal Achievement: 05/22/21 Potential to Achieve Goals: Fair    Frequency Min 3X/week     Co-evaluation               AM-PAC PT "6 Clicks" Mobility  Outcome Measure Help needed turning from your back to your side while in a flat bed without using bedrails?: None Help needed moving from lying on your back to sitting on the side of a flat bed without using bedrails?: A Little Help needed moving to and from a bed to a chair (including a wheelchair)?: A Lot Help needed standing up from a chair using your arms (e.g., wheelchair or bedside chair)?: A Lot Help needed to walk in hospital room?: A Lot Help needed climbing 3-5 steps with a railing? : A Lot 6 Click Score: 15    End of Session Equipment Utilized During Treatment: Gait belt;Oxygen Activity Tolerance: Patient tolerated treatment well;Patient limited by fatigue Patient left: in chair;with nursing/sitter in room;with call bell/phone within reach Nurse Communication: Mobility status PT Visit Diagnosis: Unsteadiness on feet (R26.81);Other abnormalities of gait and mobility (R26.89);Muscle weakness (generalized) (M62.81)    Time: 5102-5852 PT Time Calculation (min) (ACUTE ONLY): 39 min   Charges:   PT Evaluation $PT Eval Low Complexity: 1  Low PT Treatments $Therapeutic Activity: 23-37 mins        2:05 PM, 05/08/21 Mearl Latin PT, DPT Physical Therapist at [facility]

## 2021-05-08 NOTE — Care Management Important Message (Signed)
Important Message  Patient Details  Name: Victoria Adams MRN: [id number] Date of Birth: [birth year]   Medicare Important Message Given:  Yes - Important Message mailed due to current National Emergency     Tommy Medal 05/08/2021, 4:06 PM

## 2021-05-08 NOTE — Progress Notes (Signed)
PROGRESS NOTE     Victoria Adams, is a 81 y.o. female, DOB - Jun 23, 1940, XIP:[id number]  Admit date - 05/06/2021   Admitting Physician Bernadette Hoit, DO  Outpatient Primary MD for the patient is Practice, [facility]  LOS - 2  Chief Complaint  Patient presents with   Weakness       Brief Narrative:  81 y.o. female with medical history significant for HFrEF, T2DM, CKD stage 3B, hyperlipidemia, A. fib with controlled ventricular rate, GERD, CAD, obesity admitted on 05/06/2021 with acute hypoxic respiratory failure secondary to COVID-19 pneumonia  Assessment & Plan:   Principal Problem:   Pneumonia due to COVID-19 virus Active Problems:   Hyperlipidemia, unspecified   Hypokalemia   Atrial fibrillation ([place])   Chronic systolic heart failure ([place])   CKD (chronic kidney disease), stage III ([place])   Hyponatremia   Elevated troponin   Elevated brain natriuretic peptide (BNP) level   Thrombocytopenia ([place])   Hyperglycemia due to diabetes mellitus ([place])   Obesity (BMI 30-39.9)   Acute respiratory failure with hypoxia ([facility])   1)Acute hypoxic respiratory failure secondary to COVID-19 pneumonia- -Patient is unvaccinated -Hypoxia improving very slowly, currently requiring 3 L of oxygen via nasal cannula -Cough and dyspnea at rest improved -Becomes dyspneic with positional change and minimal activity -Leukopenia noted =-Continue IV Solu-Medrol, -c/n Molnupiravir -Continue zinc, vitamin C and bronchodilators and supplemental oxygen -Stopped Rocephin/azithromycin  2)DM2-no recent A1c, -Patient with worsening hyperglycemia due to steroids, -Hold linagliptin -Increase Levemir to 20  units bid Use Novolog/Humalog Sliding scale insulin with Accu-Cheks/Fingersticks as ordered   3)Chronic atrial fibrillation --- continue Eliquis for stroke prophylaxis  4)HypoNatremia--avoid dehydration, sodium up to 132 from 127  5)CKD stage - 3B -Creatinine stable between 1.2 and 1.3 -BUN  trending up due to high-dose steroids - renally adjust medications, avoid nephrotoxic agents / dehydration  / hypotension   GERD--continue Protonix especially while on high-dose steroids  HLD/CAD--- hold atorvastatin   Disposition/Need for in-Hospital Stay- patient unable to be discharged at this time due to acute hypoxic respiratory failure due to COVID-19 infection requiring supplemental oxygen and IV steroids and bronchodilators*  Status is: Inpatient  Remains inpatient appropriate because:   Disposition: The patient is from: Home              Anticipated d/c is to: Home              Anticipated d/c date is: 2 days              Patient currently is not medically stable to d/c. Barriers: Not Clinically Stable-   Code Status :  -  Code Status: Full Code   Family Communication:    (patient is alert, awake and coherent)  -Updated patient's daughter Beckie Busing by phone  Consults  :  na  DVT Prophylaxis  :   - SCDs   SCDs Start: 05/06/21 2252 apixaban (ELIQUIS) tablet 5 mg    Lab Results  Component Value Date   PLT 170 05/08/2021    Inpatient Medications  Scheduled Meds:  apixaban  5 mg Oral BID   vitamin C  500 mg Oral Daily   dextromethorphan-guaiFENesin  1 tablet Oral BID   insulin aspart  0-20 Units Subcutaneous TID WC   insulin aspart  0-5 Units Subcutaneous QHS   insulin aspart  3 Units Subcutaneous TID WC   insulin detemir  20 Units Subcutaneous BID   linagliptin  5 mg Oral Daily   methylPREDNISolone (SOLU-MEDROL) injection  60 mg Intravenous BID   molnupiravir EUA  4 capsule Oral BID   pantoprazole (PROTONIX) IV  40 mg Intravenous QHS   zinc sulfate  220 mg Oral Daily   Continuous Infusions: PRN Meds:.guaiFENesin-dextromethorphan   Anti-infectives (From admission, onward)    Start     Dose/Rate Route Frequency Ordered Stop   05/07/21 1000  cefTRIAXone (ROCEPHIN) 1 g in sodium chloride 0.9 % 100 mL IVPB  Status:  Discontinued        1 g 200 mL/hr over 30  Minutes Intravenous Every 24 hours 05/07/21 0213 05/07/21 1802   05/07/21 1000  azithromycin (ZITHROMAX) 500 mg in sodium chloride 0.9 % 250 mL IVPB  Status:  Discontinued        500 mg 250 mL/hr over 60 Minutes Intravenous Every 24 hours 05/07/21 0213 05/07/21 1802   05/06/21 2245  molnupiravir EUA (LAGEVRIO) capsule 800 mg        4 capsule Oral 2 times daily 05/06/21 2232 05/11/21 2159   05/06/21 2230  cefTRIAXone (ROCEPHIN) 1 g in sodium chloride 0.9 % 100 mL IVPB        1 g 200 mL/hr over 30 Minutes Intravenous  Once 05/06/21 2215 05/06/21 2325   05/06/21 2230  azithromycin (ZITHROMAX) 500 mg in sodium chloride 0.9 % 250 mL IVPB        500 mg 250 mL/hr over 60 Minutes Intravenous  Once 05/06/21 2215 05/07/21 0039         Subjective: Victoria Adams today has no fevers, no emesis,  No chest pain,    Cough and dyspnea at rest improved -Becomes dyspneic with positional change and minimal activity -Hypoxia persist   Objective: Vitals:   05/07/21 1418 05/07/21 2100 05/08/21 0500 05/08/21 0700  BP: (!) 107/53 (!) 106/47  (!) 119/59  Pulse: 88 88  100  Resp: 18 20  18   Temp: 97.8 F (36.6 C) 97.8 F (36.6 C)  98.1 F (36.7 C)  TempSrc:  Oral    SpO2: 93% 96%  97%  Weight:   99 kg   Height:        Intake/Output Summary (Last 24 hours) at 05/08/2021 1128 Last data filed at 05/08/2021 0345 Gross per 24 hour  Intake 1070 ml  Output 600 ml  Net 470 ml   Filed Weights   05/07/21 0037 05/07/21 0500 05/08/21 0500  Weight: 100.6 kg 100.6 kg 99 kg     Physical Exam  Gen:- Awake Alert, able to speak in sentences  HEENT:- [place].AT, No sclera icterus Nose- [place] 3 L/min Neck-Supple Neck,No JVD,.  Lungs-diminished breath sounds with scattered rhonchi  CV- S1, S2 normal, irregularly irregular Abd-  +ve B.Sounds, Abd Soft, No tenderness,    Extremity/Skin:- No  edema, pedal pulses present  Psych-affect is appropriate, oriented x3, a lot more coherent Neuro-generalized weakness, no  new focal deficits, no tremors  Data Reviewed: I have personally reviewed following labs and imaging studies  CBC: Recent Labs  Lab 05/06/21 1945 05/07/21 0624 05/08/21 0532  WBC 4.7 3.3* 3.8*  NEUTROABS 2.8 2.8 2.8  HGB 13.0 12.4 12.4  HCT 38.8 37.5 38.2  MCV 89.2 90.1 91.0  PLT 132* 145* 277   Basic Metabolic Panel: Recent Labs  Lab 05/06/21 1945 05/07/21 0624 05/08/21 0532  NA 127* 130* 132*  K 3.3* 4.5 4.3  CL 91* 96* 97*  CO2 27 21* 25  GLUCOSE 137* 284* 319*  BUN 18 20 29*  CREATININE 1.33* 1.28* 1.29*  CALCIUM 7.9* 8.1* 8.4*  MG  --  1.7 1.9  PHOS  --  4.2 3.2   GFR: Estimated Creatinine Clearance: 42 mL/min (A) (by C-G formula based on SCr of 1.29 mg/dL (H)). Liver Function Tests: Recent Labs  Lab 05/07/21 0624 05/08/21 0532  AST 34 37  ALT 23 26  ALKPHOS 87 90  BILITOT 0.8 0.4  PROT 5.9* 6.3*  ALBUMIN 2.4* 2.5*   No results for input(s): LIPASE, AMYLASE in the last 168 hours. No results for input(s): AMMONIA in the last 168 hours. Coagulation Profile: No results for input(s): INR, PROTIME in the last 168 hours. Cardiac Enzymes: No results for input(s): CKTOTAL, CKMB, CKMBINDEX, TROPONINI in the last 168 hours. BNP (last 3 results) Recent Labs    07/31/20 1649  PROBNP 2,985*   HbA1C: No results for input(s): HGBA1C in the last 72 hours. CBG: Recent Labs  Lab 05/07/21 1102 05/07/21 1639 05/07/21 2123 05/08/21 0737 05/08/21 1121  GLUCAP 309* 257* 365* 291* 306*   Lipid Profile: No results for input(s): CHOL, HDL, LDLCALC, TRIG, CHOLHDL, LDLDIRECT in the last 72 hours. Thyroid Function Tests: No results for input(s): TSH, T4TOTAL, FREET4, T3FREE, THYROIDAB in the last 72 hours. Anemia Panel: Recent Labs    05/07/21 0624 05/08/21 0532  FERRITIN 454* 418*   Urine analysis:    Component Value Date/Time   COLORURINE YELLOW 06/13/2016 1601   APPEARANCEUR CLEAR 06/13/2016 1601   LABSPEC 1.010 06/13/2016 1601   PHURINE 5.0  06/13/2016 1601   GLUCOSEU 150 (A) 06/13/2016 1601   HGBUR NEGATIVE 06/13/2016 1601   BILIRUBINUR NEGATIVE 06/13/2016 1601   KETONESUR NEGATIVE 06/13/2016 1601   PROTEINUR NEGATIVE 06/13/2016 1601   UROBILINOGEN 1.0 01/10/2014 1545   NITRITE NEGATIVE 06/13/2016 1601   LEUKOCYTESUR NEGATIVE 06/13/2016 1601   Sepsis Labs: @LABRCNTIP (procalcitonin:4,lacticidven:4)  ) Recent Results (from the past 240 hour(s))  Resp Panel by RT-PCR (Flu A&B, Covid) Nasopharyngeal Swab     Status: Abnormal   Collection Time: 05/06/21  7:45 PM   Specimen: Nasopharyngeal Swab; Nasopharyngeal(NP) swabs in vial transport medium  Result Value Ref Range Status   SARS Coronavirus 2 by RT PCR POSITIVE (A) NEGATIVE Final    Comment: (NOTE) SARS-CoV-2 target nucleic acids are DETECTED.  The SARS-CoV-2 RNA is generally detectable in upper respiratory specimens during the acute phase of infection. Positive results are indicative of the presence of the identified virus, but do not rule out bacterial infection or co-infection with other pathogens not detected by the test. Clinical correlation with patient history and other diagnostic information is necessary to determine patient infection status. The expected result is Negative.  Fact Sheet for Patients: EntrepreneurPulse.com.au  Fact Sheet for Healthcare Providers: IncredibleEmployment.be  This test is not yet approved or cleared by the [place] FDA and  has been authorized for detection and/or diagnosis of SARS-CoV-2 by FDA under an Emergency Use Authorization (EUA).  This EUA will remain in effect (meaning this test can be used) for the duration of  the COVID-19 declaration under Section 564(b)(1) of the A ct, 21 U.S.C. section 360bbb-3(b)(1), unless the authorization is terminated or revoked sooner.     Influenza A by PCR NEGATIVE NEGATIVE Final   Influenza B by PCR NEGATIVE NEGATIVE Final    Comment:  (NOTE) The Xpert Xpress SARS-CoV-2/FLU/RSV plus assay is intended as an aid in the diagnosis of influenza from Nasopharyngeal swab specimens and should not be used as a sole basis for treatment. Nasal washings and aspirates are unacceptable  for Xpert Xpress SARS-CoV-2/FLU/RSV testing.  Fact Sheet for Patients: EntrepreneurPulse.com.au  Fact Sheet for Healthcare Providers: IncredibleEmployment.be  This test is not yet approved or cleared by the [place] FDA and has been authorized for detection and/or diagnosis of SARS-CoV-2 by FDA under an Emergency Use Authorization (EUA). This EUA will remain in effect (meaning this test can be used) for the duration of the COVID-19 declaration under Section 564(b)(1) of the Act, 21 U.S.C. section 360bbb-3(b)(1), unless the authorization is terminated or revoked.  Performed at [facility], [address] [place], [place] [address]       Radiology Studies: CT Angio Chest PE W and/or Wo Contrast  Result Date: 05/06/2021 CLINICAL DATA:  Concern for pulmonary embolism. EXAM: CT ANGIOGRAPHY CHEST WITH CONTRAST TECHNIQUE: Multidetector CT imaging of the chest was performed using the standard protocol during bolus administration of intravenous contrast. Multiplanar CT image reconstructions and MIPs were obtained to evaluate the vascular anatomy. RADIATION DOSE REDUCTION: This exam was performed according to the departmental dose-optimization program which includes automated exposure control, adjustment of the mA and/or kV according to patient size and/or use of iterative reconstruction technique. CONTRAST:  62mL OMNIPAQUE IOHEXOL 350 MG/ML SOLN COMPARISON:  Chest CT dated 02/07/2019. FINDINGS: Evaluation of this exam is limited due to respiratory motion artifact. Evaluation is also limited due to streak artifact caused by pacemaker and spinal fusion hardware. Cardiovascular: There is mild cardiomegaly. No pericardial  effusion. There is coronary vascular calcification. Cardiac pacemaker wires noted. Mild atherosclerotic calcification of the thoracic aorta. No aneurysmal dilatation or dissection. No pulmonary artery embolus identified. Mediastinum/Nodes: No hilar or mediastinal adenopathy. The esophagus is grossly unremarkable. No mediastinal fluid collection. Lungs/Pleura: Bilateral scattered clusters of ground-glass and nodular densities most consistent with pneumonia, likely viral or atypical in etiology including COVID-19. Clinical correlation is recommended. Probable trace left pleural effusion. No pneumothorax. The central airways are patent. Upper Abdomen: No acute abnormality. Musculoskeletal: Osteopenia with severe degenerative changes of the spine. Partially visualized posterior fusion hardware. Probable old compression fracture of the T11, progressed since the study of 2020. No definite acute osseous pathology. Review of the MIP images confirms the above findings. IMPRESSION: 1. No CT evidence of pulmonary artery embolus. 2. Multilobar pneumonia, likely viral or atypical in etiology. 3. Aortic Atherosclerosis (ICD10-I70.0). Electronically Signed   By: Anner Crete M.D.   On: 05/06/2021 21:50     Scheduled Meds:  apixaban  5 mg Oral BID   vitamin C  500 mg Oral Daily   dextromethorphan-guaiFENesin  1 tablet Oral BID   insulin aspart  0-20 Units Subcutaneous TID WC   insulin aspart  0-5 Units Subcutaneous QHS   insulin aspart  3 Units Subcutaneous TID WC   insulin detemir  20 Units Subcutaneous BID   linagliptin  5 mg Oral Daily   methylPREDNISolone (SOLU-MEDROL) injection  60 mg Intravenous BID   molnupiravir EUA  4 capsule Oral BID   pantoprazole (PROTONIX) IV  40 mg Intravenous QHS   zinc sulfate  220 mg Oral Daily   Continuous Infusions:   LOS: 2 days   Roxan Hockey M.D on 05/08/2021 at 11:28 AM  Go to [email or web address] - for contact info  [facility] - Office  [phone]  If  7PM-7AM, please contact night-coverage [email or web address] Password [facility] 05/08/2021, 11:28 AM

## 2021-05-09 DIAGNOSIS — E669 Obesity, unspecified: Secondary | ICD-10-CM | POA: Diagnosis not present

## 2021-05-09 DIAGNOSIS — I2510 Atherosclerotic heart disease of native coronary artery without angina pectoris: Secondary | ICD-10-CM | POA: Diagnosis not present

## 2021-05-09 DIAGNOSIS — E785 Hyperlipidemia, unspecified: Secondary | ICD-10-CM | POA: Diagnosis not present

## 2021-05-09 DIAGNOSIS — J1282 Pneumonia due to coronavirus disease 2019: Secondary | ICD-10-CM | POA: Diagnosis not present

## 2021-05-09 DIAGNOSIS — R7989 Other specified abnormal findings of blood chemistry: Secondary | ICD-10-CM | POA: Diagnosis not present

## 2021-05-09 DIAGNOSIS — N189 Chronic kidney disease, unspecified: Secondary | ICD-10-CM | POA: Diagnosis not present

## 2021-05-09 DIAGNOSIS — E871 Hypo-osmolality and hyponatremia: Secondary | ICD-10-CM | POA: Diagnosis not present

## 2021-05-09 DIAGNOSIS — R531 Weakness: Secondary | ICD-10-CM | POA: Diagnosis not present

## 2021-05-09 DIAGNOSIS — I4891 Unspecified atrial fibrillation: Secondary | ICD-10-CM | POA: Diagnosis not present

## 2021-05-09 DIAGNOSIS — G8929 Other chronic pain: Secondary | ICD-10-CM | POA: Diagnosis not present

## 2021-05-09 DIAGNOSIS — D071 Carcinoma in situ of vulva: Secondary | ICD-10-CM | POA: Diagnosis not present

## 2021-05-09 DIAGNOSIS — F419 Anxiety disorder, unspecified: Secondary | ICD-10-CM | POA: Diagnosis not present

## 2021-05-09 DIAGNOSIS — F339 Major depressive disorder, recurrent, unspecified: Secondary | ICD-10-CM | POA: Diagnosis not present

## 2021-05-09 DIAGNOSIS — R7309 Other abnormal glucose: Secondary | ICD-10-CM | POA: Diagnosis not present

## 2021-05-09 DIAGNOSIS — R739 Hyperglycemia, unspecified: Secondary | ICD-10-CM | POA: Diagnosis not present

## 2021-05-09 DIAGNOSIS — U071 COVID-19: Secondary | ICD-10-CM | POA: Diagnosis not present

## 2021-05-09 DIAGNOSIS — J9601 Acute respiratory failure with hypoxia: Secondary | ICD-10-CM | POA: Diagnosis not present

## 2021-05-09 DIAGNOSIS — J189 Pneumonia, unspecified organism: Secondary | ICD-10-CM | POA: Diagnosis not present

## 2021-05-09 DIAGNOSIS — K219 Gastro-esophageal reflux disease without esophagitis: Secondary | ICD-10-CM | POA: Diagnosis not present

## 2021-05-09 DIAGNOSIS — R0602 Shortness of breath: Secondary | ICD-10-CM | POA: Diagnosis not present

## 2021-05-09 DIAGNOSIS — Z79899 Other long term (current) drug therapy: Secondary | ICD-10-CM | POA: Diagnosis not present

## 2021-05-09 DIAGNOSIS — I5022 Chronic systolic (congestive) heart failure: Secondary | ICD-10-CM | POA: Diagnosis not present

## 2021-05-09 DIAGNOSIS — M109 Gout, unspecified: Secondary | ICD-10-CM | POA: Diagnosis not present

## 2021-05-09 DIAGNOSIS — R269 Unspecified abnormalities of gait and mobility: Secondary | ICD-10-CM | POA: Diagnosis not present

## 2021-05-09 DIAGNOSIS — B372 Candidiasis of skin and nail: Secondary | ICD-10-CM | POA: Diagnosis not present

## 2021-05-09 DIAGNOSIS — E119 Type 2 diabetes mellitus without complications: Secondary | ICD-10-CM | POA: Diagnosis not present

## 2021-05-09 DIAGNOSIS — M6281 Muscle weakness (generalized): Secondary | ICD-10-CM | POA: Diagnosis not present

## 2021-05-09 DIAGNOSIS — F32A Depression, unspecified: Secondary | ICD-10-CM | POA: Diagnosis not present

## 2021-05-09 DIAGNOSIS — I509 Heart failure, unspecified: Secondary | ICD-10-CM | POA: Diagnosis not present

## 2021-05-09 LAB — RENAL FUNCTION PANEL
Albumin: 2.6 g/dL — ABNORMAL LOW (ref 3.5–5.0)
Anion gap: 9 (ref 5–15)
BUN: 36 mg/dL — ABNORMAL HIGH (ref 8–23)
CO2: 23 mmol/L (ref 22–32)
Calcium: 8.7 mg/dL — ABNORMAL LOW (ref 8.9–10.3)
Chloride: 103 mmol/L (ref 98–111)
Creatinine, Ser: 1.35 mg/dL — ABNORMAL HIGH (ref 0.44–1.00)
GFR, Estimated: 40 mL/min — ABNORMAL LOW (ref >60)
Glucose, Bld: 184 mg/dL — ABNORMAL HIGH (ref 70–99)
Phosphorus: 3.2 mg/dL (ref 2.5–4.6)
Potassium: 4.4 mmol/L (ref 3.5–5.1)
Sodium: 135 mmol/L (ref 135–145)

## 2021-05-09 LAB — CBC WITH DIFFERENTIAL/PLATELET
Abs Immature Granulocytes: 0.03 10*3/uL (ref 0.00–0.07)
Basophils Absolute: 0 10*3/uL (ref 0.0–0.1)
Basophils Relative: 0 %
Eosinophils Absolute: 0 10*3/uL (ref 0.0–0.5)
Eosinophils Relative: 0 %
HCT: 39.9 % (ref 36.0–46.0)
Hemoglobin: 13.2 g/dL (ref 12.0–15.0)
Immature Granulocytes: 0 %
Lymphocytes Relative: 12 %
Lymphs Abs: 0.9 10*3/uL (ref 0.7–4.0)
MCH: 30 pg (ref 26.0–34.0)
MCHC: 33.1 g/dL (ref 30.0–36.0)
MCV: 90.7 fL (ref 80.0–100.0)
Monocytes Absolute: 0.4 10*3/uL (ref 0.1–1.0)
Monocytes Relative: 5 %
Neutro Abs: 5.7 10*3/uL (ref 1.7–7.7)
Neutrophils Relative %: 83 %
Platelets: 193 10*3/uL (ref 150–400)
RBC: 4.4 MIL/uL (ref 3.87–5.11)
RDW: 13.8 % (ref 11.5–15.5)
WBC: 7 10*3/uL (ref 4.0–10.5)
nRBC: 0 % (ref 0.0–0.2)

## 2021-05-09 LAB — C-REACTIVE PROTEIN: CRP: 3.1 mg/dL — ABNORMAL HIGH (ref <1.0)

## 2021-05-09 LAB — GLUCOSE, CAPILLARY
Glucose-Capillary: 207 mg/dL — ABNORMAL HIGH (ref 70–99)
Glucose-Capillary: 215 mg/dL — ABNORMAL HIGH (ref 70–99)

## 2021-05-09 MED ORDER — TORSEMIDE 20 MG PO TABS: 20.0000 mg | ORAL_TABLET | Freq: Two times a day (BID) | ORAL | 3 refills | Status: DC

## 2021-05-09 MED ORDER — LORAZEPAM 0.5 MG PO TABS: 0.5000 mg | ORAL_TABLET | Freq: Every day | ORAL | 0 refills | Status: DC

## 2021-05-09 MED ORDER — MIRTAZAPINE 30 MG PO TABS: 30.0000 mg | ORAL_TABLET | Freq: Every day | ORAL | 3 refills | Status: DC

## 2021-05-09 MED ORDER — FERROUS SULFATE 325 (65 FE) MG PO TABS: 325.0000 mg | ORAL_TABLET | Freq: Every day | ORAL | 3 refills | Status: DC

## 2021-05-09 MED ORDER — ASCORBIC ACID 500 MG PO TABS: 500.0000 mg | ORAL_TABLET | Freq: Every day | ORAL | 1 refills | Status: DC

## 2021-05-09 MED ORDER — LINAGLIPTIN 5 MG PO TABS: 5.0000 mg | ORAL_TABLET | Freq: Every day | ORAL | 5 refills | Status: DC

## 2021-05-09 MED ORDER — HYDROCODONE-ACETAMINOPHEN 5-325 MG PO TABS: 2.0000 | ORAL_TABLET | Freq: Two times a day (BID) | ORAL | 0 refills | Status: DC | PRN

## 2021-05-09 MED ORDER — DM-GUAIFENESIN ER 30-600 MG PO TB12: 1.0000 | ORAL_TABLET | Freq: Two times a day (BID) | ORAL | 2 refills | Status: DC

## 2021-05-09 MED ORDER — ALBUTEROL SULFATE HFA 108 (90 BASE) MCG/ACT IN AERS: 2.0000 | INHALATION_SPRAY | Freq: Four times a day (QID) | RESPIRATORY_TRACT | 2 refills | Status: DC | PRN

## 2021-05-09 MED ORDER — PREDNISONE 20 MG PO TABS: 40.0000 mg | ORAL_TABLET | Freq: Every day | ORAL | 0 refills | Status: AC

## 2021-05-09 MED ORDER — OMEPRAZOLE 20 MG PO CPDR: 20.0000 mg | DELAYED_RELEASE_CAPSULE | Freq: Every day | ORAL | 3 refills | Status: DC

## 2021-05-09 MED ORDER — METOPROLOL SUCCINATE ER 50 MG PO TB24: 50.0000 mg | ORAL_TABLET | Freq: Every day | ORAL | 3 refills | Status: DC

## 2021-05-09 MED ORDER — INSULIN GLARGINE 100 UNIT/ML ~~LOC~~ SOLN
32.0000 [IU] | Freq: Every day | SUBCUTANEOUS | 11 refills | Status: DC
Start: 2021-05-09 — End: 2023-09-08

## 2021-05-09 MED ORDER — INSULIN ASPART 100 UNIT/ML FLEXPEN
0.0000 [IU] | PEN_INJECTOR | Freq: Three times a day (TID) | SUBCUTANEOUS | 11 refills | Status: DC
Start: 2021-05-09 — End: 2023-09-08

## 2021-05-09 MED ORDER — APIXABAN 5 MG PO TABS: 5.0000 mg | ORAL_TABLET | Freq: Two times a day (BID) | ORAL | 3 refills | Status: DC

## 2021-05-09 MED ORDER — MOLNUPIRAVIR EUA 200MG CAPSULE: 4.0000 | ORAL_CAPSULE | Freq: Two times a day (BID) | ORAL | 0 refills | Status: AC

## 2021-05-09 MED ORDER — METOLAZONE 2.5 MG PO TABS: 2.5000 mg | ORAL_TABLET | ORAL | 1 refills | Status: DC

## 2021-05-09 MED ORDER — ZINC SULFATE 220 (50 ZN) MG PO CAPS: 220.0000 mg | ORAL_CAPSULE | Freq: Every day | ORAL | 1 refills | Status: DC

## 2021-05-09 MED ORDER — GUAIFENESIN-DM 100-10 MG/5ML PO SYRP: 10.0000 mL | ORAL_SOLUTION | ORAL | 0 refills | Status: DC | PRN

## 2021-05-09 MED ORDER — POTASSIUM CHLORIDE CRYS ER 20 MEQ PO TBCR: 20.0000 meq | EXTENDED_RELEASE_TABLET | Freq: Every day | ORAL | 3 refills | Status: DC

## 2021-05-09 NOTE — Plan of Care (Signed)

## 2021-05-09 NOTE — [place] FL2 (Signed)
[place] MEDICAID FL2 LEVEL OF CARE SCREENING TOOL     IDENTIFICATION  Patient Name: Victoria Adams Birthdate: 14-Feb-1941 Sex: female Admission Date (Current Location): 05/06/2021  [facility] and [place] Number:  [employer] and Address:  [place] [facility] [address] [place]      Provider Number: [id number]  Attending Physician Name and Address:  Roxan Hockey, MD  Relative Name and Phone Number:       Current Level of Care: Hospital Recommended Level of Care: [place] Prior Approval Number:    Date Approved/Denied:   PASRR Number: [id number]  Discharge Plan: SNF    Current Diagnoses: Patient Active Problem List   Diagnosis Date Noted   Elevated troponin 05/07/2021   Elevated brain natriuretic peptide (BNP) level 05/07/2021   Thrombocytopenia ([place]) 05/07/2021   Hyperglycemia due to diabetes mellitus ([place]) 05/07/2021   Obesity (BMI 30-39.9) 05/07/2021   Acute respiratory failure with hypoxia ([place]) 05/07/2021   Pneumonia due to COVID-19 virus 05/06/2021   C. difficile colitis 01/22/2021   Back pain 02/08/2019   Thoracic compression fracture ([place]) 02/07/2019   Dyspnea 02/07/2019   Right foot pain    Hyponatremia 06/12/2016   Flu-like symptoms 06/12/2016   Acute kidney injury superimposed on CKD ([place]) 06/12/2016   Weakness generalized 06/12/2016   Lactic acid acidosis 06/12/2016   Type 2 diabetes mellitus with complication ([place])    Falls 09/13/2014   CKD (chronic kidney disease), stage III ([place]) 09/13/2014   Benign neoplasm of colon 12/20/2013   Hyperkalemia 10/03/2013   Orthostatic hypotension 10/03/2013   IDA (iron deficiency anemia) 08/24/2013   NSTEMI (non-ST elevated myocardial infarction) ([place]) 08/22/2013   Long term (current) use of anticoagulants 07/20/2010   NICM (nonischemic cardiomyopathy) ([place]) 10/28/2009   LBBB (left bundle branch block) 10/28/2009   Atrial fibrillation ([place]) 10/28/2009    Hyperlipidemia, unspecified 06/06/2009   Hypokalemia 06/06/2009   Morbid obesity ([place]) 06/06/2009   Essential hypertension, benign 06/06/2009   Atherosclerosis of native coronary artery 76/16/0737   Chronic systolic heart failure ([place]) 06/06/2009    Orientation RESPIRATION BLADDER Height & Weight     Self, Time, Situation, Place  Normal Continent Weight: 214 lb 15.2 oz (97.5 kg) Height:  5\' 6"  (167.6 cm)  BEHAVIORAL SYMPTOMS/MOOD NEUROLOGICAL BOWEL NUTRITION STATUS      Continent Diet  AMBULATORY STATUS COMMUNICATION OF NEEDS Skin   Extensive Assist Verbally Normal                       Personal Care Assistance Level of Assistance  Bathing, Feeding, Dressing, Total care Bathing Assistance: Limited assistance Feeding assistance: Independent Dressing Assistance: Limited assistance Total Care Assistance: Limited assistance   Functional Limitations Info  Sight, Hearing, Speech Sight Info: Adequate Hearing Info: Adequate Speech Info: Adequate    SPECIAL CARE FACTORS FREQUENCY  PT (By licensed PT), OT (By licensed OT)     PT Frequency: 5 times weekly OT Frequency: 5 times weekly            Contractures Contractures Info: Not present    Additional Factors Info  Code Status, Allergies Code Status Info: FULL Allergies Info: Acetaminophen, codeine, tylox (oxycodone-acetaminophen)           Current Medications (05/09/2021):  This is the current hospital active medication list Current Facility-Administered Medications  Medication Dose Route Frequency Provider Last Rate Last Admin   apixaban (ELIQUIS) tablet 5 mg  5 mg Oral BID Adefeso,  [place], DO   5 mg at 05/09/21 4196   ascorbic acid (VITAMIN C) tablet 500 mg  500 mg Oral Daily Adefeso, Oladapo, DO   500 mg at 05/09/21 0824   dextromethorphan-guaiFENesin (MUCINEX DM) 30-600 MG per 12 hr tablet 1 tablet  1 tablet Oral BID Adefeso, Oladapo, DO   1 tablet at 05/09/21 0824   guaiFENesin-dextromethorphan (ROBITUSSIN  DM) 100-10 MG/5ML syrup 10 mL  10 mL Oral Q4H PRN Adefeso, Oladapo, DO   10 mL at 05/09/21 0037   insulin aspart (novoLOG) injection 0-20 Units  0-20 Units Subcutaneous TID WC Emokpae, Courage, MD   7 Units at 05/09/21 0823   insulin aspart (novoLOG) injection 0-5 Units  0-5 Units Subcutaneous QHS Emokpae, Courage, MD   5 Units at 05/07/21 2339   insulin aspart (novoLOG) injection 3 Units  3 Units Subcutaneous TID WC Emokpae, Courage, MD   3 Units at 05/09/21 0823   insulin detemir (LEVEMIR) injection 20 Units  20 Units Subcutaneous BID Emokpae, Courage, MD   20 Units at 05/09/21 0824   linagliptin (TRADJENTA) tablet 5 mg  5 mg Oral Daily Adefeso, Oladapo, DO   5 mg at 05/09/21 0824   methylPREDNISolone sodium succinate (SOLU-MEDROL) 125 mg/2 mL injection 60 mg  60 mg Intravenous BID Emokpae, Courage, MD   60 mg at 05/09/21 0824   molnupiravir EUA (LAGEVRIO) capsule 800 mg  4 capsule Oral BID Blue, Soijett A, PA-C   800 mg at 05/09/21 0824   pantoprazole (PROTONIX) injection 40 mg  40 mg Intravenous QHS Adefeso, Oladapo, DO   40 mg at 05/08/21 2215   zinc sulfate capsule 220 mg  220 mg Oral Daily Adefeso, Oladapo, DO   220 mg at 05/09/21 2229     Discharge Medications: Please see discharge summary for a list of discharge medications.  Relevant Imaging Results:  Relevant Lab Results:   Additional Information SSN: [id number] [place]

## 2021-05-09 NOTE — TOC Transition Note (Signed)
Transition of Care [facility]) - CM/SW Discharge Note   Patient Details  Name: Victoria Adams MRN: [id number] Date of Birth: [birth year]  Transition of Care [facility]) CM/SW Contact:  Iona Beard, [place] Phone Number: 05/09/2021, 1:59 PM   Clinical Narrative:    TOC notified that PT recommended SNF for pt. CSW spoke with pts daughter Victoria Adams who states this is what pt needs. CSW spoke with pt who is agreeable to SNF. CSW updated pt and Victoria Adams that [facility] is the only facility accepting COVID+ patients. CSW spoke to [place] in admissions who accepts pt and can take pt today into facility. Pt will go to room B13. CSW updated MD and RN of this. CSW provided RN with number for report. CSW updated pts daughter of discharge to SNF. CSW called for [facility] to transport pt via wheelchair. TOC signing off.   Final next level of care: Skilled Nursing Facility Barriers to Discharge: Barriers Resolved   Patient Goals and CMS Choice Patient states their goals for this hospitalization and ongoing recovery are:: Go to SNF CMS Medicare.gov Compare Post Acute Care list provided to:: Patient Choice offered to / list presented to : Patient, Adult Children  Discharge Placement              Patient chooses bed at: Other - please specify in the comment section below: [facility]) Patient to be transferred to facility by: [facility] Name of family member notified: [name] Patient and family notified of of transfer: 05/09/21  Discharge Plan and Services     Post Acute Care Choice: Home Health                    HH Arranged: RN, PT, OT [facility] Agency: [place] ([place]) Date [place]: 05/08/21 Time [place]: 1119 Representative spoke with at [place]: [place] ([place]) Interventions     Readmission Risk Interventions No flowsheet data found.

## 2021-05-09 NOTE — Discharge Instructions (Signed)
1) sliding scale insulin as ordered --insulin aspart (novoLOG) injection 0-10 Units 0-10 Units Subcutaneous, 3 times daily with meals CBG < 70: Implement Hypoglycemia Standing Orders and refer to Hypoglycemia Standing Orders sidebar report  CBG 70 - 120: 0 unit CBG 121 - 150: 0 unit  CBG 151 - 200: 1 unit CBG 201 - 250: 2 units CBG 251 - 300: 4 units CBG 301 - 350: 6 units  CBG 351 - 400: 8 units  CBG > 400: 10 units  2)Avoid ibuprofen/Advil/Aleve/Motrin/Goody Powders/Naproxen/BC powders/Meloxicam/Diclofenac/Indomethacin and other Nonsteroidal anti-inflammatory medications as these will make you more likely to bleed and can cause stomach ulcers, can also cause Kidney problems.   3) repeat CBC and BMP blood test within a week around February 3rd, 2023  4) You are strongly advised to isolate/quarantine for at least 10 days from the date of your diagnosis with COVID-19 infection--please always wear a mask if you have to go outside the house   5)Please take medications as prescribed--- please note that there has been medication changes  6)Video/Virtual follow-up visit with primary care physician in about a week advised

## 2021-05-09 NOTE — Discharge Summary (Signed)
Victoria Adams, is a 81 y.o. female  DOB March 15, 1941  MRN [id number].  Admission date:  05/06/2021  Admitting Physician  Bernadette Hoit, DO  Discharge Date:  05/09/2021   Primary MD  Practice, [facility]  Recommendations for primary care physician for things to follow:   1) sliding scale insulin as ordered --insulin aspart (novoLOG) injection 0-10 Units 0-10 Units Subcutaneous, 3 times daily with meals CBG < 70: Implement Hypoglycemia Standing Orders and refer to Hypoglycemia Standing Orders sidebar report  CBG 70 - 120: 0 unit CBG 121 - 150: 0 unit  CBG 151 - 200: 1 unit CBG 201 - 250: 2 units CBG 251 - 300: 4 units CBG 301 - 350: 6 units  CBG 351 - 400: 8 units  CBG > 400: 10 units  2)Avoid ibuprofen/Advil/Aleve/Motrin/Goody Powders/Naproxen/BC powders/Meloxicam/Diclofenac/Indomethacin and other Nonsteroidal anti-inflammatory medications as these will make you more likely to bleed and can cause stomach ulcers, can also cause Kidney problems.   3) repeat CBC and BMP blood test within a week around February 3rd, 2023  4) You are strongly advised to isolate/quarantine for at least 10 days from the date of your diagnosis with COVID-19 infection--please always wear a mask if you have to go outside the house   5)Please take medications as prescribed--- please note that there has been medication changes  6)Video/Virtual follow-up visit with primary care physician in about a week advised   Admission Diagnosis  Generalized weakness [R53.1] Community acquired pneumonia, unspecified laterality [J18.9] Pneumonia due to COVID-19 virus [U07.1, J12.82] COVID-19 [U07.1]   Discharge Diagnosis  Generalized weakness [R53.1] Community acquired pneumonia, unspecified laterality [J18.9] Pneumonia due to COVID-19 virus [U07.1, J12.82] COVID-19 [U07.1]  =  Principal Problem:   Pneumonia due to COVID-19  virus Active Problems:   Hyperlipidemia, unspecified   Hypokalemia   Atrial fibrillation (HCC)   Chronic systolic heart failure (HCC)   CKD (chronic kidney disease), stage III (HCC)   Hyponatremia   Elevated troponin   Elevated brain natriuretic peptide (BNP) level   Thrombocytopenia ([place])   Hyperglycemia due to diabetes mellitus ([place])   Obesity (BMI 30-39.9)   Acute respiratory failure with hypoxia (HCC)      Past Medical History:  Diagnosis Date   Allergic rhinitis    Arthritis    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 echo (5/15):  mod LVH, EF 20-25%, diff HK, severe LAE, mild RAE   Coronary atherosclerosis-non obstructive    LHC (5/15):  EF 40-45% global HK; long LAD 40-60%, ostial 1st major septal perf 80-90%, ostial CFX ?%, mid AVCFX extensive Ca2+, dist PDA 50%   Depression    Essential hypertension, benign    Fatty liver disease, nonalcoholic    GERD (gastroesophageal reflux disease)    Iron deficiency anemia    Mitral regurgitation  NICM (nonischemic cardiomyopathy) ([place])    a. echo (5/15):  mod LVH, EF 20-25%, diff HK, severe LAE, mild RAE   NSTEMI (non-ST elevated myocardial infarction) ([place]) may 2015   No CAD   Obesity    Osteopenia    PONV (postoperative nausea and vomiting)    Rosacea    Type 2 diabetes mellitus ([place])    Vitamin D deficiency     Past Surgical History:  Procedure Laterality Date   ABDOMINAL HYSTERECTOMY     APPENDECTOMY     BREAST BIOPSY     RIGHT   CARDIAC CATHETERIZATION  2010   LAD 50%, CFX 30%, RCA 40%; EF by echo 25-35%   CATARACT EXTRACTION Bilateral    CHOLECYSTECTOMY     COLONOSCOPY WITH PROPOFOL N/A 12/20/2013   Procedure: COLONOSCOPY WITH PROPOFOL;  Surgeon: Milus Banister, MD;  Location: [facility];  Service: Endoscopy;  Laterality: N/A;   EP IMPLANTABLE DEVICE N/A 04/16/2016    Procedure: BiV ICD Insertion CRT-D;  Surgeon: Deboraha Sprang, MD;  Location: [place] [facility];  Service: Cardiovascular;  Laterality: N/A;   ESOPHAGOGASTRODUODENOSCOPY N/A 08/25/2013   Procedure: ESOPHAGOGASTRODUODENOSCOPY (EGD);  Surgeon: Milus Banister, MD;  Location: [place];  Service: Endoscopy;  Laterality: N/A;   LEFT HEART CATHETERIZATION WITH CORONARY ANGIOGRAM N/A 08/24/2013   Procedure: LEFT HEART CATHETERIZATION WITH CORONARY ANGIOGRAM;  Surgeon: Leonie Man, MD;  Location: [facility] CATH LAB;  Service: Cardiovascular;  Laterality: N/A;   LUMBAR PERCUTANEOUS PEDICLE SCREW 2 LEVEL N/A 02/15/2019   Procedure: Percutaneous Pedicle Screw Fixation from Thoracic Ten-Thoracic Twelve with Methylmethacrylate Screw Augmentation;  Surgeon: Earnie Larsson, MD;  Location: [place];  Service: Neurosurgery;  Laterality: N/A;   ROTATOR CUFF REPAIR     RIGHT SHOULDER   TONSILLECTOMY  yrs ago   TOTAL ABDOMINAL HYSTERECTOMY W/ BILATERAL SALPINGOOPHORECTOMY     for dermoid tumor     HPI  from the history and physical done on the day of admission:    Chief Complaint: Generalized weakness    HPI: [name] is a 81 y.o. female with medical history significant for HFrEF, T2DM, CKD stage 3B, hyperlipidemia, A. fib with controlled ventricular rate, GERD, CAD, obesity who presents to the emergency department due to 5-day onset of nonproductive cough associated with chest pain, nasal and chest congestion, generalized weakness.  Patient was diagnosed to have COVID-19 virus at [facility] ED on 05/01/2021, she was deemed to not qualify for Paxlovid and was discharged home.  Patient complained of worsening weakness, generalized body aches, decreased appetite and increasing shortness of breath.  She lives at home with son who has not tested for COVID, patient states that she never took COVID-vaccine or booster, she ambulates with a walker at baseline and she does not use oxygen at baseline.  She denies fever, chills,  nausea, vomiting, diarrhea or constipation.   ED Course:  In the emergency department, she was intermittently tachypneic and tachycardic.  BP was 121/63.  Troponin x2 -65 > 64, BNP 169, thrombocytopenia, hyponatremia, hypokalemia.  Influenza A, B was negative.  SARS coronavirus 2 was positive. CT chest with contrast showed no ct evidence of pulmonary artery embolus, but showed multilobar pneumonia, likely viral or atypical in etiology. Patient was empirically treated with IV ceftriaxone and azithromycin, Decadron was given and she was started on Molnupiravir.  Hospitalist was asked to admit patient for further evaluation and management.   Review of Systems: A full 10 point Review of Systems was done,  except as stated above, all other Review of systems were negative.     Hospital Course:    Brief Narrative:  81 y.o. female with medical history significant for HFrEF, T2DM, CKD stage 3B, hyperlipidemia, A. fib with controlled ventricular rate, GERD, CAD, obesity admitted on 05/06/2021 with acute hypoxic respiratory failure secondary to COVID-19 pneumonia   A/p 1)Acute hypoxic respiratory failure secondary to COVID-19 pneumonia- -Patient is unvaccinated -Hypoxia Resolved-patient has been completely weaned off oxygen --Leukopenia resolved -Patient was treated with iV Solu-Medrol, okay to discharge on p.o. prednisone -Okay to complete Molnupiravir -Continue zinc, vitamin C and bronchodilators and supplemental oxygen -Stopped Rocephin/azithromycin -Cough improved, dyspnea resolved   2)DM2-no recent A1c, -Patient with worsening hyperglycemia due to steroids, -Okay to restart Linagliptin -Increase Lantus insulin to 32 units nightly Use Novolog/Humalog Sliding scale insulin with Accu-Cheks/Fingersticks as ordered    3)Chronic atrial fibrillation --- continue Eliquis for stroke prophylaxis   4)HypoNatremia--avoid dehydration, sodium up to 132 from 127   5)CKD stage - 3B -Creatinine stable  between 1.2 and 1.3 -BUN trending up due to high-dose steroids - renally adjust medications, avoid nephrotoxic agents / dehydration  / hypotension  6) generalized weakness/deconditioning/ambulatory dysfunction----PT eval appreciated recommends SNF rehab, patient and family agreeable  7)HFpEF--patient with chronic diastolic dysfunction CHF, echo from January 2022 with EF of 55 to 60% and mild pulmonary hypertension -Diuretics adjusted    GERD--continue PPI especially while on high-dose steroids   HLD/CAD--- okay to restart atorvastatin     Disposition--- discharge to SNF rehab   Disposition: The patient is from: Home              Anticipated d/c is to: SNF rehab                Code Status :  -  Code Status: Full Code    Family Communication:    (patient is alert, awake and coherent)  -Updated patient's daughter Beckie Busing by phone   Consults  :  na  Discharge Condition: Stable without hypoxia  Follow UP   Follow-up Information     [facility] Follow [facility].   Specialty: Home Health Services Why: They will call to schedule your home visit.                Diet and Activity recommendation:  As advised  Discharge Instructions    Discharge Instructions     Call MD for:  difficulty breathing, headache or visual disturbances   Complete by: As directed    Call MD for:  persistant dizziness or light-headedness   Complete by: As directed    Call MD for:  persistant nausea and vomiting   Complete by: As directed    Call MD for:  temperature >100.4   Complete by: As directed    Diet - low sodium heart healthy   Complete by: As directed    Diet Carb Modified   Complete by: As directed    Discharge instructions   Complete by: As directed    1) sliding scale insulin as ordered --insulin aspart (novoLOG) injection 0-10 Units 0-10 Units Subcutaneous, 3 times daily with meals CBG < 70: Implement Hypoglycemia Standing Orders and refer to Hypoglycemia Standing  Orders sidebar report  CBG 70 - 120: 0 unit CBG 121 - 150: 0 unit  CBG 151 - 200: 1 unit CBG 201 - 250: 2 units CBG 251 - 300: 4 units CBG 301 - 350: 6 units  CBG 351 - 400: 8  units  CBG > 400: 10 units  2)Avoid ibuprofen/Advil/Aleve/Motrin/Goody Powders/Naproxen/BC powders/Meloxicam/Diclofenac/Indomethacin and other Nonsteroidal anti-inflammatory medications as these will make you more likely to bleed and can cause stomach ulcers, can also cause Kidney problems.   3) repeat CBC and BMP blood test within a week around February 3rd, 2023  4) You are strongly advised to isolate/quarantine for at least 10 days from the date of your diagnosis with COVID-19 infection--please always wear a mask if you have to go outside the house   5)Please take medications as prescribed--- please note that there has been medication changes  6)Video/Virtual follow-up visit with primary care physician in about a week advised   Increase activity slowly   Complete by: As directed         Discharge Medications     Allergies as of 05/09/2021       Reactions   Acetaminophen    Codeine Nausea And Vomiting   Tylox [oxycodone-acetaminophen]    Vision closing in & heart palpitations.         Medication List     STOP taking these medications    colchicine 0.6 MG tablet   rosuvastatin 20 MG tablet Commonly known as: CRESTOR       TAKE these medications    acetaminophen 325 MG tablet Commonly known as: TYLENOL Take 650 mg by mouth every 6 (six) hours as needed for mild pain.   albuterol 108 (90 Base) MCG/ACT inhaler Commonly known as: VENTOLIN HFA Inhale 2 puffs into the lungs every 6 (six) hours as needed for wheezing or shortness of breath.   apixaban 5 MG Tabs tablet Commonly known as: Eliquis Take 1 tablet (5 mg total) by mouth 2 (two) times daily. What changed: See the new instructions.   ascorbic acid 500 MG tablet Commonly known as: VITAMIN C Take 1 tablet (500 mg total) by mouth  daily. Start taking on: May 10, 2021   atorvastatin 80 MG tablet Commonly known as: LIPITOR Take 80 mg by mouth daily.   busPIRone 7.5 MG tablet Commonly known as: BUSPAR Take 7.5 mg by mouth 2 (two) times daily.   doxycycline 100 MG capsule Commonly known as: VIBRAMYCIN Take 100-200 mg by mouth 2 (two) times daily as needed (rosacea).   DULoxetine 60 MG capsule Commonly known as: CYMBALTA Take 60 mg by mouth daily.   ferrous sulfate 325 (65 FE) MG tablet Take 1 tablet (325 mg total) by mouth daily with breakfast.   guaiFENesin-dextromethorphan 100-10 MG/5ML syrup Commonly known as: ROBITUSSIN DM Take 10 mLs by mouth every 4 (four) hours as needed for cough.   dextromethorphan-guaiFENesin 30-600 MG 12hr tablet Commonly known as: MUCINEX DM Take 1 tablet by mouth 2 (two) times daily.   HYDROcodone-acetaminophen 5-325 MG tablet Commonly known as: NORCO/VICODIN Take 2 tablets by mouth every 12 (twelve) hours as needed for moderate pain. What changed:  when to take this reasons to take this   insulin aspart 100 UNIT/ML FlexPen Commonly known as: NOVOLOG Inject 0-10 Units into the skin 3 (three) times daily with meals. insulin aspart (novoLOG) injection 0-10 Units 0-10 Units Subcutaneous, 3 times daily with meals CBG < 70: Implement Hypoglycemia Standing Orders and refer to Hypoglycemia Standing Orders sidebar report  CBG 70 - 120: 0 unit CBG 121 - 150: 0 unit  CBG 151 - 200: 1 unit CBG 201 - 250: 2 units CBG 251 - 300: 4 units CBG 301 - 350: 6 units  CBG 351 - 400: 8 units  CBG > 400: 10 units   insulin glargine 100 UNIT/ML injection Commonly known as: LANTUS Inject 0.32 mLs (32 Units total) into the skin at bedtime. What changed:  how much to take when to take this   linagliptin 5 MG Tabs tablet Commonly known as: TRADJENTA Take 1 tablet (5 mg total) by mouth daily. Start taking on: May 10, 2021   LORazepam 0.5 MG tablet Commonly known as: ATIVAN Take 1  tablet (0.5 mg total) by mouth at bedtime.   Magnesium Oxide 400 MG Caps Take 400 mg by mouth 2 (two) times daily.   metolazone 2.5 MG tablet Commonly known as: ZAROXOLYN Take 1 tablet (2.5 mg total) by mouth every Monday, Wednesday, and Friday. Start taking on: May 11, 2021 What changed:  how much to take how to take this when to take this additional instructions   metoprolol succinate 50 MG 24 hr tablet Commonly known as: TOPROL-XL Take 1 tablet (50 mg total) by mouth daily. What changed:  how much to take when to take this   mirtazapine 30 MG tablet Commonly known as: REMERON Take 1 tablet (30 mg total) by mouth at bedtime.   molnupiravir EUA 200 mg Caps capsule Commonly known as: LAGEVRIO Take 4 capsules (800 mg total) by mouth 2 (two) times daily for 2 days.   multivitamin-lutein Caps capsule Take 1 capsule by mouth 2 (two) times daily.   omeprazole 20 MG capsule Commonly known as: PRILOSEC Take 1 capsule (20 mg total) by mouth daily. What changed: when to take this   ondansetron 4 MG disintegrating tablet Commonly known as: Zofran ODT Take 1 tablet (4 mg total) by mouth every 8 (eight) hours as needed for nausea or vomiting.   potassium chloride SA 20 MEQ tablet Commonly known as: KLOR-CON M Take 1 tablet (20 mEq total) by mouth daily. What changed: when to take this   predniSONE 20 MG tablet Commonly known as: DELTASONE Take 2 tablets (40 mg total) by mouth daily with breakfast for 5 days.   torsemide 20 MG tablet Commonly known as: DEMADEX Take 1 tablet (20 mg total) by mouth 2 (two) times daily. What changed: how much to take   zinc sulfate 220 (50 Zn) MG capsule Take 1 capsule (220 mg total) by mouth daily. Start taking on: May 10, 2021        Major procedures and Radiology Reports - PLEASE review detailed and final reports for all details, in brief -   CT Angio Chest PE W and/or Wo Contrast  Result Date: 05/06/2021 CLINICAL DATA:   Concern for pulmonary embolism. EXAM: CT ANGIOGRAPHY CHEST WITH CONTRAST TECHNIQUE: Multidetector CT imaging of the chest was performed using the standard protocol during bolus administration of intravenous contrast. Multiplanar CT image reconstructions and MIPs were obtained to evaluate the vascular anatomy. RADIATION DOSE REDUCTION: This exam was performed according to the departmental dose-optimization program which includes automated exposure control, adjustment of the mA and/or kV according to patient size and/or use of iterative reconstruction technique. CONTRAST:  51mL OMNIPAQUE IOHEXOL 350 MG/ML SOLN COMPARISON:  Chest CT dated 02/07/2019. FINDINGS: Evaluation of this exam is limited due to respiratory motion artifact. Evaluation is also limited due to streak artifact caused by pacemaker and spinal fusion hardware. Cardiovascular: There is mild cardiomegaly. No pericardial effusion. There is coronary vascular calcification. Cardiac pacemaker wires noted. Mild atherosclerotic calcification of the thoracic aorta. No aneurysmal dilatation or dissection. No pulmonary artery embolus identified. Mediastinum/Nodes: No hilar or mediastinal adenopathy.  The esophagus is grossly unremarkable. No mediastinal fluid collection. Lungs/Pleura: Bilateral scattered clusters of ground-glass and nodular densities most consistent with pneumonia, likely viral or atypical in etiology including COVID-19. Clinical correlation is recommended. Probable trace left pleural effusion. No pneumothorax. The central airways are patent. Upper Abdomen: No acute abnormality. Musculoskeletal: Osteopenia with severe degenerative changes of the spine. Partially visualized posterior fusion hardware. Probable old compression fracture of the T11, progressed since the study of 2020. No definite acute osseous pathology. Review of the MIP images confirms the above findings. IMPRESSION: 1. No CT evidence of pulmonary artery embolus. 2. Multilobar  pneumonia, likely viral or atypical in etiology. 3. Aortic Atherosclerosis (ICD10-I70.0). Electronically Signed   By: Anner Crete M.D.   On: 05/06/2021 21:50    Micro Results   Recent Results (from the past 240 hour(s))  Resp Panel by RT-PCR (Flu A&B, Covid) Nasopharyngeal Swab     Status: Abnormal   Collection Time: 05/06/21  7:45 PM   Specimen: Nasopharyngeal Swab; Nasopharyngeal(NP) swabs in vial transport medium  Result Value Ref Range Status   SARS Coronavirus 2 by RT PCR POSITIVE (A) NEGATIVE Final    Comment: (NOTE) SARS-CoV-2 target nucleic acids are DETECTED.  The SARS-CoV-2 RNA is generally detectable in upper respiratory specimens during the acute phase of infection. Positive results are indicative of the presence of the identified virus, but do not rule out bacterial infection or co-infection with other pathogens not detected by the test. Clinical correlation with patient history and other diagnostic information is necessary to determine patient infection status. The expected result is Negative.  Fact Sheet for Patients: EntrepreneurPulse.com.au  Fact Sheet for Healthcare Providers: IncredibleEmployment.be  This test is not yet approved or cleared by the [place] FDA and  has been authorized for detection and/or diagnosis of SARS-CoV-2 by FDA under an Emergency Use Authorization (EUA).  This EUA will remain in effect (meaning this test can be used) for the duration of  the COVID-19 declaration under Section 564(b)(1) of the A ct, 21 U.S.C. section 360bbb-3(b)(1), unless the authorization is terminated or revoked sooner.     Influenza A by PCR NEGATIVE NEGATIVE Final   Influenza B by PCR NEGATIVE NEGATIVE Final    Comment: (NOTE) The Xpert Xpress SARS-CoV-2/FLU/RSV plus assay is intended as an aid in the diagnosis of influenza from Nasopharyngeal swab specimens and should not be used as a sole basis for treatment. Nasal  washings and aspirates are unacceptable for Xpert Xpress SARS-CoV-2/FLU/RSV testing.  Fact Sheet for Patients: EntrepreneurPulse.com.au  Fact Sheet for Healthcare Providers: IncredibleEmployment.be  This test is not yet approved or cleared by the [place] FDA and has been authorized for detection and/or diagnosis of SARS-CoV-2 by FDA under an Emergency Use Authorization (EUA). This EUA will remain in effect (meaning this test can be used) for the duration of the COVID-19 declaration under Section 564(b)(1) of the Act, 21 U.S.C. section 360bbb-3(b)(1), unless the authorization is terminated or revoked.  Performed at [facility], [address], [place], [place] [address]     Today   Subjective    Maretta Overdorf today has no eating and drinking well, no fevers, -Cough improving, -Dyspnea at rest and hypoxia resolved -Patient has fatigue, malaise and some dyspnea with activity          Patient has been seen and examined prior to discharge   Objective   Blood pressure 120/61, pulse (!) 102, temperature 98.3 F (36.8 C), resp. rate 16, height 5\' 6"  (1.676  m), weight 97.5 kg, SpO2 94 %.   Intake/Output Summary (Last 24 hours) at 05/09/2021 1227 Last data filed at 05/09/2021 0900 Gross per 24 hour  Intake 480 ml  Output 2200 ml  Net -1720 ml    Exam Gen:- Awake Alert, no acute distress , speaking in complete sentences HEENT:- [place].AT, No sclera icterus Neck-Supple Neck,No JVD,.  Lungs-improved air movement, no wheezing CV- S1, S2 normal, irregular  abd-  +ve B.Sounds, Abd Soft, No tenderness,    Extremity/Skin:- No  edema,   good pulses Psych-affect is appropriate, oriented x3, coherent Neuro-generalized weakness, no new focal deficits, no tremors    Data Review   CBC w Diff:  Lab Results  Component Value Date   WBC 7.0 05/09/2021   HGB 13.2 05/09/2021   HCT 39.9 05/09/2021   PLT 193 05/09/2021   LYMPHOPCT 12 05/09/2021    MONOPCT 5 05/09/2021   EOSPCT 0 05/09/2021   BASOPCT 0 05/09/2021    CMP:  Lab Results  Component Value Date   NA 135 05/09/2021   NA 144 08/06/2020   K 4.4 05/09/2021   CL 103 05/09/2021   CO2 23 05/09/2021   BUN 36 (H) 05/09/2021   BUN 41 (H) 08/06/2020   CREATININE 1.35 (H) 05/09/2021   PROT 6.3 (L) 05/08/2021   ALBUMIN 2.6 (L) 05/09/2021   BILITOT 0.4 05/08/2021   ALKPHOS 90 05/08/2021   AST 37 05/08/2021   ALT 26 05/08/2021  .   Total Discharge time is about 33 minutes  Roxan Hockey M.D on 05/09/2021 at 12:27 PM  Go to [email or web address] -  for contact info  [facility] - Office  [phone]

## 2021-05-09 NOTE — Progress Notes (Signed)
0930- Pt bathed and bed linens changed, pt set up for breakfast. A&O, denies c/o. Resp even and non-labored, no cough noted. Pt on room air with SaO2 94%.

## 2021-05-09 NOTE — Care Management Important Message (Signed)
Important Message  Patient Details  Name: Victoria Adams MRN: [id number] Date of Birth: 1940/10/12   Medicare Important Message Given:  Yes     Iona Beard, [place] 05/09/2021, 1:47 PM

## 2021-05-11 DIAGNOSIS — J189 Pneumonia, unspecified organism: Secondary | ICD-10-CM | POA: Diagnosis not present

## 2021-05-11 DIAGNOSIS — E119 Type 2 diabetes mellitus without complications: Secondary | ICD-10-CM | POA: Diagnosis not present

## 2021-05-11 DIAGNOSIS — U071 COVID-19: Secondary | ICD-10-CM | POA: Diagnosis not present

## 2021-05-11 DIAGNOSIS — E785 Hyperlipidemia, unspecified: Secondary | ICD-10-CM | POA: Diagnosis not present

## 2021-05-11 DIAGNOSIS — Z79899 Other long term (current) drug therapy: Secondary | ICD-10-CM | POA: Diagnosis not present

## 2021-05-11 DIAGNOSIS — I4891 Unspecified atrial fibrillation: Secondary | ICD-10-CM | POA: Diagnosis not present

## 2021-05-11 DIAGNOSIS — R531 Weakness: Secondary | ICD-10-CM | POA: Diagnosis not present

## 2021-05-11 DIAGNOSIS — N189 Chronic kidney disease, unspecified: Secondary | ICD-10-CM | POA: Diagnosis not present

## 2021-05-15 DIAGNOSIS — J9601 Acute respiratory failure with hypoxia: Secondary | ICD-10-CM | POA: Diagnosis not present

## 2021-05-15 DIAGNOSIS — U071 COVID-19: Secondary | ICD-10-CM | POA: Diagnosis not present

## 2021-05-15 DIAGNOSIS — E871 Hypo-osmolality and hyponatremia: Secondary | ICD-10-CM | POA: Diagnosis not present

## 2021-05-15 DIAGNOSIS — J1282 Pneumonia due to coronavirus disease 2019: Secondary | ICD-10-CM | POA: Diagnosis not present

## 2021-05-15 DIAGNOSIS — N189 Chronic kidney disease, unspecified: Secondary | ICD-10-CM | POA: Diagnosis not present

## 2021-05-15 DIAGNOSIS — E785 Hyperlipidemia, unspecified: Secondary | ICD-10-CM | POA: Diagnosis not present

## 2021-05-15 DIAGNOSIS — E119 Type 2 diabetes mellitus without complications: Secondary | ICD-10-CM | POA: Diagnosis not present

## 2021-05-15 DIAGNOSIS — I4891 Unspecified atrial fibrillation: Secondary | ICD-10-CM | POA: Diagnosis not present

## 2021-05-18 DIAGNOSIS — E119 Type 2 diabetes mellitus without complications: Secondary | ICD-10-CM | POA: Diagnosis not present

## 2021-05-18 DIAGNOSIS — N189 Chronic kidney disease, unspecified: Secondary | ICD-10-CM | POA: Diagnosis not present

## 2021-05-18 DIAGNOSIS — R739 Hyperglycemia, unspecified: Secondary | ICD-10-CM | POA: Diagnosis not present

## 2021-05-18 DIAGNOSIS — E785 Hyperlipidemia, unspecified: Secondary | ICD-10-CM | POA: Diagnosis not present

## 2021-05-18 DIAGNOSIS — K219 Gastro-esophageal reflux disease without esophagitis: Secondary | ICD-10-CM | POA: Diagnosis not present

## 2021-05-18 DIAGNOSIS — I5022 Chronic systolic (congestive) heart failure: Secondary | ICD-10-CM | POA: Diagnosis not present

## 2021-05-18 DIAGNOSIS — I4891 Unspecified atrial fibrillation: Secondary | ICD-10-CM | POA: Diagnosis not present

## 2021-05-18 DIAGNOSIS — I2510 Atherosclerotic heart disease of native coronary artery without angina pectoris: Secondary | ICD-10-CM | POA: Diagnosis not present

## 2021-05-19 DIAGNOSIS — M109 Gout, unspecified: Secondary | ICD-10-CM | POA: Diagnosis not present

## 2021-05-21 DIAGNOSIS — Z79899 Other long term (current) drug therapy: Secondary | ICD-10-CM | POA: Diagnosis not present

## 2021-05-21 DIAGNOSIS — G8929 Other chronic pain: Secondary | ICD-10-CM | POA: Diagnosis not present

## 2021-05-21 DIAGNOSIS — E119 Type 2 diabetes mellitus without complications: Secondary | ICD-10-CM | POA: Diagnosis not present

## 2021-05-21 DIAGNOSIS — R7309 Other abnormal glucose: Secondary | ICD-10-CM | POA: Diagnosis not present

## 2021-05-21 DIAGNOSIS — I5022 Chronic systolic (congestive) heart failure: Secondary | ICD-10-CM | POA: Diagnosis not present

## 2021-05-21 DIAGNOSIS — E785 Hyperlipidemia, unspecified: Secondary | ICD-10-CM | POA: Diagnosis not present

## 2021-05-21 DIAGNOSIS — R531 Weakness: Secondary | ICD-10-CM | POA: Diagnosis not present

## 2021-05-25 DIAGNOSIS — F32A Depression, unspecified: Secondary | ICD-10-CM | POA: Diagnosis not present

## 2021-05-25 DIAGNOSIS — B372 Candidiasis of skin and nail: Secondary | ICD-10-CM | POA: Diagnosis not present

## 2021-05-25 DIAGNOSIS — E119 Type 2 diabetes mellitus without complications: Secondary | ICD-10-CM | POA: Diagnosis not present

## 2021-05-25 DIAGNOSIS — I2510 Atherosclerotic heart disease of native coronary artery without angina pectoris: Secondary | ICD-10-CM | POA: Diagnosis not present

## 2021-05-25 DIAGNOSIS — K219 Gastro-esophageal reflux disease without esophagitis: Secondary | ICD-10-CM | POA: Diagnosis not present

## 2021-05-25 DIAGNOSIS — I4891 Unspecified atrial fibrillation: Secondary | ICD-10-CM | POA: Diagnosis not present

## 2021-05-25 DIAGNOSIS — J9601 Acute respiratory failure with hypoxia: Secondary | ICD-10-CM | POA: Diagnosis not present

## 2021-05-25 DIAGNOSIS — E785 Hyperlipidemia, unspecified: Secondary | ICD-10-CM | POA: Diagnosis not present

## 2021-05-28 DIAGNOSIS — R7989 Other specified abnormal findings of blood chemistry: Secondary | ICD-10-CM | POA: Diagnosis not present

## 2021-05-28 DIAGNOSIS — E785 Hyperlipidemia, unspecified: Secondary | ICD-10-CM | POA: Diagnosis not present

## 2021-05-28 DIAGNOSIS — I4891 Unspecified atrial fibrillation: Secondary | ICD-10-CM | POA: Diagnosis not present

## 2021-05-28 DIAGNOSIS — K219 Gastro-esophageal reflux disease without esophagitis: Secondary | ICD-10-CM | POA: Diagnosis not present

## 2021-05-28 DIAGNOSIS — I5022 Chronic systolic (congestive) heart failure: Secondary | ICD-10-CM | POA: Diagnosis not present

## 2021-05-28 DIAGNOSIS — N189 Chronic kidney disease, unspecified: Secondary | ICD-10-CM | POA: Diagnosis not present

## 2021-05-28 DIAGNOSIS — F32A Depression, unspecified: Secondary | ICD-10-CM | POA: Diagnosis not present

## 2021-05-28 DIAGNOSIS — E119 Type 2 diabetes mellitus without complications: Secondary | ICD-10-CM | POA: Diagnosis not present

## 2021-06-02 DIAGNOSIS — Z79899 Other long term (current) drug therapy: Secondary | ICD-10-CM | POA: Diagnosis not present

## 2021-06-02 DIAGNOSIS — N189 Chronic kidney disease, unspecified: Secondary | ICD-10-CM | POA: Diagnosis not present

## 2021-06-02 DIAGNOSIS — E785 Hyperlipidemia, unspecified: Secondary | ICD-10-CM | POA: Diagnosis not present

## 2021-06-02 DIAGNOSIS — R7309 Other abnormal glucose: Secondary | ICD-10-CM | POA: Diagnosis not present

## 2021-06-02 DIAGNOSIS — G8929 Other chronic pain: Secondary | ICD-10-CM | POA: Diagnosis not present

## 2021-06-02 DIAGNOSIS — E119 Type 2 diabetes mellitus without complications: Secondary | ICD-10-CM | POA: Diagnosis not present

## 2021-06-02 DIAGNOSIS — K219 Gastro-esophageal reflux disease without esophagitis: Secondary | ICD-10-CM | POA: Diagnosis not present

## 2021-06-04 DIAGNOSIS — F419 Anxiety disorder, unspecified: Secondary | ICD-10-CM | POA: Diagnosis not present

## 2021-06-04 DIAGNOSIS — I4891 Unspecified atrial fibrillation: Secondary | ICD-10-CM | POA: Diagnosis not present

## 2021-06-04 DIAGNOSIS — K219 Gastro-esophageal reflux disease without esophagitis: Secondary | ICD-10-CM | POA: Diagnosis not present

## 2021-06-04 DIAGNOSIS — E119 Type 2 diabetes mellitus without complications: Secondary | ICD-10-CM | POA: Diagnosis not present

## 2021-06-04 DIAGNOSIS — Z79899 Other long term (current) drug therapy: Secondary | ICD-10-CM | POA: Diagnosis not present

## 2021-06-04 DIAGNOSIS — F32A Depression, unspecified: Secondary | ICD-10-CM | POA: Diagnosis not present

## 2021-06-04 DIAGNOSIS — I5022 Chronic systolic (congestive) heart failure: Secondary | ICD-10-CM | POA: Diagnosis not present

## 2021-06-04 DIAGNOSIS — E785 Hyperlipidemia, unspecified: Secondary | ICD-10-CM | POA: Diagnosis not present

## 2021-06-11 DIAGNOSIS — F32A Depression, unspecified: Secondary | ICD-10-CM | POA: Diagnosis not present

## 2021-06-11 DIAGNOSIS — E785 Hyperlipidemia, unspecified: Secondary | ICD-10-CM | POA: Diagnosis not present

## 2021-06-11 DIAGNOSIS — I2510 Atherosclerotic heart disease of native coronary artery without angina pectoris: Secondary | ICD-10-CM | POA: Diagnosis not present

## 2021-06-11 DIAGNOSIS — E119 Type 2 diabetes mellitus without complications: Secondary | ICD-10-CM | POA: Diagnosis not present

## 2021-06-11 DIAGNOSIS — I5022 Chronic systolic (congestive) heart failure: Secondary | ICD-10-CM | POA: Diagnosis not present

## 2021-06-11 DIAGNOSIS — K219 Gastro-esophageal reflux disease without esophagitis: Secondary | ICD-10-CM | POA: Diagnosis not present

## 2021-06-11 DIAGNOSIS — I4891 Unspecified atrial fibrillation: Secondary | ICD-10-CM | POA: Diagnosis not present

## 2021-06-11 DIAGNOSIS — N189 Chronic kidney disease, unspecified: Secondary | ICD-10-CM | POA: Diagnosis not present

## 2021-06-13 DIAGNOSIS — M5137 Other intervertebral disc degeneration, lumbosacral region: Secondary | ICD-10-CM | POA: Diagnosis not present

## 2021-06-13 DIAGNOSIS — E669 Obesity, unspecified: Secondary | ICD-10-CM | POA: Diagnosis not present

## 2021-06-13 DIAGNOSIS — M479 Spondylosis, unspecified: Secondary | ICD-10-CM | POA: Diagnosis not present

## 2021-06-13 DIAGNOSIS — M8580 Other specified disorders of bone density and structure, unspecified site: Secondary | ICD-10-CM | POA: Diagnosis not present

## 2021-06-13 DIAGNOSIS — I130 Hypertensive heart and chronic kidney disease with heart failure and stage 1 through stage 4 chronic kidney disease, or unspecified chronic kidney disease: Secondary | ICD-10-CM | POA: Diagnosis not present

## 2021-06-13 DIAGNOSIS — M10372 Gout due to renal impairment, left ankle and foot: Secondary | ICD-10-CM | POA: Diagnosis not present

## 2021-06-13 DIAGNOSIS — E1165 Type 2 diabetes mellitus with hyperglycemia: Secondary | ICD-10-CM | POA: Diagnosis not present

## 2021-06-13 DIAGNOSIS — K219 Gastro-esophageal reflux disease without esophagitis: Secondary | ICD-10-CM | POA: Diagnosis not present

## 2021-06-13 DIAGNOSIS — I5022 Chronic systolic (congestive) heart failure: Secondary | ICD-10-CM | POA: Diagnosis not present

## 2021-06-13 DIAGNOSIS — E1122 Type 2 diabetes mellitus with diabetic chronic kidney disease: Secondary | ICD-10-CM | POA: Diagnosis not present

## 2021-06-13 DIAGNOSIS — E1142 Type 2 diabetes mellitus with diabetic polyneuropathy: Secondary | ICD-10-CM | POA: Diagnosis not present

## 2021-06-13 DIAGNOSIS — G8929 Other chronic pain: Secondary | ICD-10-CM | POA: Diagnosis not present

## 2021-06-13 DIAGNOSIS — I2510 Atherosclerotic heart disease of native coronary artery without angina pectoris: Secondary | ICD-10-CM | POA: Diagnosis not present

## 2021-06-13 DIAGNOSIS — K760 Fatty (change of) liver, not elsewhere classified: Secondary | ICD-10-CM | POA: Diagnosis not present

## 2021-06-13 DIAGNOSIS — E559 Vitamin D deficiency, unspecified: Secondary | ICD-10-CM | POA: Diagnosis not present

## 2021-06-13 DIAGNOSIS — F331 Major depressive disorder, recurrent, moderate: Secondary | ICD-10-CM | POA: Diagnosis not present

## 2021-06-13 DIAGNOSIS — E871 Hypo-osmolality and hyponatremia: Secondary | ICD-10-CM | POA: Diagnosis not present

## 2021-06-13 DIAGNOSIS — I428 Other cardiomyopathies: Secondary | ICD-10-CM | POA: Diagnosis not present

## 2021-06-13 DIAGNOSIS — I051 Rheumatic mitral insufficiency: Secondary | ICD-10-CM | POA: Diagnosis not present

## 2021-06-13 DIAGNOSIS — J9601 Acute respiratory failure with hypoxia: Secondary | ICD-10-CM | POA: Diagnosis not present

## 2021-06-13 DIAGNOSIS — N1832 Chronic kidney disease, stage 3b: Secondary | ICD-10-CM | POA: Diagnosis not present

## 2021-06-13 DIAGNOSIS — U071 COVID-19: Secondary | ICD-10-CM | POA: Diagnosis not present

## 2021-06-13 DIAGNOSIS — J1282 Pneumonia due to coronavirus disease 2019: Secondary | ICD-10-CM | POA: Diagnosis not present

## 2021-06-13 DIAGNOSIS — E782 Mixed hyperlipidemia: Secondary | ICD-10-CM | POA: Diagnosis not present

## 2021-06-13 DIAGNOSIS — I4891 Unspecified atrial fibrillation: Secondary | ICD-10-CM | POA: Diagnosis not present

## 2021-06-15 DIAGNOSIS — I5022 Chronic systolic (congestive) heart failure: Secondary | ICD-10-CM | POA: Diagnosis not present

## 2021-06-15 DIAGNOSIS — J9601 Acute respiratory failure with hypoxia: Secondary | ICD-10-CM | POA: Diagnosis not present

## 2021-06-15 DIAGNOSIS — I130 Hypertensive heart and chronic kidney disease with heart failure and stage 1 through stage 4 chronic kidney disease, or unspecified chronic kidney disease: Secondary | ICD-10-CM | POA: Diagnosis not present

## 2021-06-15 DIAGNOSIS — J1282 Pneumonia due to coronavirus disease 2019: Secondary | ICD-10-CM | POA: Diagnosis not present

## 2021-06-15 DIAGNOSIS — E1165 Type 2 diabetes mellitus with hyperglycemia: Secondary | ICD-10-CM | POA: Diagnosis not present

## 2021-06-15 DIAGNOSIS — U071 COVID-19: Secondary | ICD-10-CM | POA: Diagnosis not present

## 2021-06-16 DIAGNOSIS — U071 COVID-19: Secondary | ICD-10-CM | POA: Diagnosis not present

## 2021-06-16 DIAGNOSIS — J1282 Pneumonia due to coronavirus disease 2019: Secondary | ICD-10-CM | POA: Diagnosis not present

## 2021-06-16 DIAGNOSIS — J9601 Acute respiratory failure with hypoxia: Secondary | ICD-10-CM | POA: Diagnosis not present

## 2021-06-16 DIAGNOSIS — E1165 Type 2 diabetes mellitus with hyperglycemia: Secondary | ICD-10-CM | POA: Diagnosis not present

## 2021-06-16 DIAGNOSIS — I5022 Chronic systolic (congestive) heart failure: Secondary | ICD-10-CM | POA: Diagnosis not present

## 2021-06-16 DIAGNOSIS — I130 Hypertensive heart and chronic kidney disease with heart failure and stage 1 through stage 4 chronic kidney disease, or unspecified chronic kidney disease: Secondary | ICD-10-CM | POA: Diagnosis not present

## 2021-06-17 DIAGNOSIS — U071 COVID-19: Secondary | ICD-10-CM | POA: Diagnosis not present

## 2021-06-17 DIAGNOSIS — J9601 Acute respiratory failure with hypoxia: Secondary | ICD-10-CM | POA: Diagnosis not present

## 2021-06-17 DIAGNOSIS — I5022 Chronic systolic (congestive) heart failure: Secondary | ICD-10-CM | POA: Diagnosis not present

## 2021-06-17 DIAGNOSIS — J1282 Pneumonia due to coronavirus disease 2019: Secondary | ICD-10-CM | POA: Diagnosis not present

## 2021-06-17 DIAGNOSIS — I130 Hypertensive heart and chronic kidney disease with heart failure and stage 1 through stage 4 chronic kidney disease, or unspecified chronic kidney disease: Secondary | ICD-10-CM | POA: Diagnosis not present

## 2021-06-17 DIAGNOSIS — E1165 Type 2 diabetes mellitus with hyperglycemia: Secondary | ICD-10-CM | POA: Diagnosis not present

## 2021-06-18 DIAGNOSIS — I130 Hypertensive heart and chronic kidney disease with heart failure and stage 1 through stage 4 chronic kidney disease, or unspecified chronic kidney disease: Secondary | ICD-10-CM | POA: Diagnosis not present

## 2021-06-18 DIAGNOSIS — U071 COVID-19: Secondary | ICD-10-CM | POA: Diagnosis not present

## 2021-06-18 DIAGNOSIS — J1282 Pneumonia due to coronavirus disease 2019: Secondary | ICD-10-CM | POA: Diagnosis not present

## 2021-06-18 DIAGNOSIS — E1165 Type 2 diabetes mellitus with hyperglycemia: Secondary | ICD-10-CM | POA: Diagnosis not present

## 2021-06-18 DIAGNOSIS — J9601 Acute respiratory failure with hypoxia: Secondary | ICD-10-CM | POA: Diagnosis not present

## 2021-06-18 DIAGNOSIS — I5022 Chronic systolic (congestive) heart failure: Secondary | ICD-10-CM | POA: Diagnosis not present

## 2021-06-22 DIAGNOSIS — E1165 Type 2 diabetes mellitus with hyperglycemia: Secondary | ICD-10-CM | POA: Diagnosis not present

## 2021-06-22 DIAGNOSIS — J9601 Acute respiratory failure with hypoxia: Secondary | ICD-10-CM | POA: Diagnosis not present

## 2021-06-22 DIAGNOSIS — I5022 Chronic systolic (congestive) heart failure: Secondary | ICD-10-CM | POA: Diagnosis not present

## 2021-06-22 DIAGNOSIS — I130 Hypertensive heart and chronic kidney disease with heart failure and stage 1 through stage 4 chronic kidney disease, or unspecified chronic kidney disease: Secondary | ICD-10-CM | POA: Diagnosis not present

## 2021-06-22 DIAGNOSIS — U071 COVID-19: Secondary | ICD-10-CM | POA: Diagnosis not present

## 2021-06-22 DIAGNOSIS — J1282 Pneumonia due to coronavirus disease 2019: Secondary | ICD-10-CM | POA: Diagnosis not present

## 2021-06-23 DIAGNOSIS — I130 Hypertensive heart and chronic kidney disease with heart failure and stage 1 through stage 4 chronic kidney disease, or unspecified chronic kidney disease: Secondary | ICD-10-CM | POA: Diagnosis not present

## 2021-06-23 DIAGNOSIS — J1282 Pneumonia due to coronavirus disease 2019: Secondary | ICD-10-CM | POA: Diagnosis not present

## 2021-06-23 DIAGNOSIS — U071 COVID-19: Secondary | ICD-10-CM | POA: Diagnosis not present

## 2021-06-23 DIAGNOSIS — I5022 Chronic systolic (congestive) heart failure: Secondary | ICD-10-CM | POA: Diagnosis not present

## 2021-06-23 DIAGNOSIS — J9601 Acute respiratory failure with hypoxia: Secondary | ICD-10-CM | POA: Diagnosis not present

## 2021-06-23 DIAGNOSIS — E1165 Type 2 diabetes mellitus with hyperglycemia: Secondary | ICD-10-CM | POA: Diagnosis not present

## 2021-06-24 DIAGNOSIS — U071 COVID-19: Secondary | ICD-10-CM | POA: Diagnosis not present

## 2021-06-24 DIAGNOSIS — I5022 Chronic systolic (congestive) heart failure: Secondary | ICD-10-CM | POA: Diagnosis not present

## 2021-06-24 DIAGNOSIS — E1165 Type 2 diabetes mellitus with hyperglycemia: Secondary | ICD-10-CM | POA: Diagnosis not present

## 2021-06-24 DIAGNOSIS — J1282 Pneumonia due to coronavirus disease 2019: Secondary | ICD-10-CM | POA: Diagnosis not present

## 2021-06-24 DIAGNOSIS — I130 Hypertensive heart and chronic kidney disease with heart failure and stage 1 through stage 4 chronic kidney disease, or unspecified chronic kidney disease: Secondary | ICD-10-CM | POA: Diagnosis not present

## 2021-06-24 DIAGNOSIS — J9601 Acute respiratory failure with hypoxia: Secondary | ICD-10-CM | POA: Diagnosis not present

## 2021-06-25 DIAGNOSIS — J1282 Pneumonia due to coronavirus disease 2019: Secondary | ICD-10-CM | POA: Diagnosis not present

## 2021-06-25 DIAGNOSIS — E1165 Type 2 diabetes mellitus with hyperglycemia: Secondary | ICD-10-CM | POA: Diagnosis not present

## 2021-06-25 DIAGNOSIS — J9601 Acute respiratory failure with hypoxia: Secondary | ICD-10-CM | POA: Diagnosis not present

## 2021-06-25 DIAGNOSIS — U071 COVID-19: Secondary | ICD-10-CM | POA: Diagnosis not present

## 2021-06-25 DIAGNOSIS — I130 Hypertensive heart and chronic kidney disease with heart failure and stage 1 through stage 4 chronic kidney disease, or unspecified chronic kidney disease: Secondary | ICD-10-CM | POA: Diagnosis not present

## 2021-06-25 DIAGNOSIS — I5022 Chronic systolic (congestive) heart failure: Secondary | ICD-10-CM | POA: Diagnosis not present

## 2021-06-27 DIAGNOSIS — U071 COVID-19: Secondary | ICD-10-CM | POA: Diagnosis not present

## 2021-06-27 DIAGNOSIS — J9601 Acute respiratory failure with hypoxia: Secondary | ICD-10-CM | POA: Diagnosis not present

## 2021-06-27 DIAGNOSIS — I5022 Chronic systolic (congestive) heart failure: Secondary | ICD-10-CM | POA: Diagnosis not present

## 2021-06-27 DIAGNOSIS — I130 Hypertensive heart and chronic kidney disease with heart failure and stage 1 through stage 4 chronic kidney disease, or unspecified chronic kidney disease: Secondary | ICD-10-CM | POA: Diagnosis not present

## 2021-06-27 DIAGNOSIS — J1282 Pneumonia due to coronavirus disease 2019: Secondary | ICD-10-CM | POA: Diagnosis not present

## 2021-06-27 DIAGNOSIS — E1165 Type 2 diabetes mellitus with hyperglycemia: Secondary | ICD-10-CM | POA: Diagnosis not present

## 2021-06-29 DIAGNOSIS — U071 COVID-19: Secondary | ICD-10-CM | POA: Diagnosis not present

## 2021-06-29 DIAGNOSIS — E1165 Type 2 diabetes mellitus with hyperglycemia: Secondary | ICD-10-CM | POA: Diagnosis not present

## 2021-06-29 DIAGNOSIS — I130 Hypertensive heart and chronic kidney disease with heart failure and stage 1 through stage 4 chronic kidney disease, or unspecified chronic kidney disease: Secondary | ICD-10-CM | POA: Diagnosis not present

## 2021-06-29 DIAGNOSIS — J1282 Pneumonia due to coronavirus disease 2019: Secondary | ICD-10-CM | POA: Diagnosis not present

## 2021-06-29 DIAGNOSIS — J9601 Acute respiratory failure with hypoxia: Secondary | ICD-10-CM | POA: Diagnosis not present

## 2021-06-29 DIAGNOSIS — I5022 Chronic systolic (congestive) heart failure: Secondary | ICD-10-CM | POA: Diagnosis not present

## 2021-06-30 DIAGNOSIS — I5022 Chronic systolic (congestive) heart failure: Secondary | ICD-10-CM | POA: Diagnosis not present

## 2021-06-30 DIAGNOSIS — E1122 Type 2 diabetes mellitus with diabetic chronic kidney disease: Secondary | ICD-10-CM | POA: Diagnosis not present

## 2021-06-30 DIAGNOSIS — N1832 Chronic kidney disease, stage 3b: Secondary | ICD-10-CM | POA: Diagnosis not present

## 2021-06-30 DIAGNOSIS — E1165 Type 2 diabetes mellitus with hyperglycemia: Secondary | ICD-10-CM | POA: Diagnosis not present

## 2021-06-30 DIAGNOSIS — J9601 Acute respiratory failure with hypoxia: Secondary | ICD-10-CM | POA: Diagnosis not present

## 2021-06-30 DIAGNOSIS — I130 Hypertensive heart and chronic kidney disease with heart failure and stage 1 through stage 4 chronic kidney disease, or unspecified chronic kidney disease: Secondary | ICD-10-CM | POA: Diagnosis not present

## 2021-06-30 DIAGNOSIS — J1282 Pneumonia due to coronavirus disease 2019: Secondary | ICD-10-CM | POA: Diagnosis not present

## 2021-06-30 DIAGNOSIS — U071 COVID-19: Secondary | ICD-10-CM | POA: Diagnosis not present

## 2021-07-01 DIAGNOSIS — E1165 Type 2 diabetes mellitus with hyperglycemia: Secondary | ICD-10-CM | POA: Diagnosis not present

## 2021-07-01 DIAGNOSIS — I130 Hypertensive heart and chronic kidney disease with heart failure and stage 1 through stage 4 chronic kidney disease, or unspecified chronic kidney disease: Secondary | ICD-10-CM | POA: Diagnosis not present

## 2021-07-01 DIAGNOSIS — J1282 Pneumonia due to coronavirus disease 2019: Secondary | ICD-10-CM | POA: Diagnosis not present

## 2021-07-01 DIAGNOSIS — U071 COVID-19: Secondary | ICD-10-CM | POA: Diagnosis not present

## 2021-07-01 DIAGNOSIS — I5022 Chronic systolic (congestive) heart failure: Secondary | ICD-10-CM | POA: Diagnosis not present

## 2021-07-01 DIAGNOSIS — J9601 Acute respiratory failure with hypoxia: Secondary | ICD-10-CM | POA: Diagnosis not present

## 2021-07-06 DIAGNOSIS — J9601 Acute respiratory failure with hypoxia: Secondary | ICD-10-CM | POA: Diagnosis not present

## 2021-07-06 DIAGNOSIS — U071 COVID-19: Secondary | ICD-10-CM | POA: Diagnosis not present

## 2021-07-06 DIAGNOSIS — J1282 Pneumonia due to coronavirus disease 2019: Secondary | ICD-10-CM | POA: Diagnosis not present

## 2021-07-06 DIAGNOSIS — I5022 Chronic systolic (congestive) heart failure: Secondary | ICD-10-CM | POA: Diagnosis not present

## 2021-07-06 DIAGNOSIS — E1165 Type 2 diabetes mellitus with hyperglycemia: Secondary | ICD-10-CM | POA: Diagnosis not present

## 2021-07-06 DIAGNOSIS — I130 Hypertensive heart and chronic kidney disease with heart failure and stage 1 through stage 4 chronic kidney disease, or unspecified chronic kidney disease: Secondary | ICD-10-CM | POA: Diagnosis not present

## 2021-07-07 DIAGNOSIS — E1165 Type 2 diabetes mellitus with hyperglycemia: Secondary | ICD-10-CM | POA: Diagnosis not present

## 2021-07-07 DIAGNOSIS — J1282 Pneumonia due to coronavirus disease 2019: Secondary | ICD-10-CM | POA: Diagnosis not present

## 2021-07-07 DIAGNOSIS — I5022 Chronic systolic (congestive) heart failure: Secondary | ICD-10-CM | POA: Diagnosis not present

## 2021-07-07 DIAGNOSIS — I130 Hypertensive heart and chronic kidney disease with heart failure and stage 1 through stage 4 chronic kidney disease, or unspecified chronic kidney disease: Secondary | ICD-10-CM | POA: Diagnosis not present

## 2021-07-07 DIAGNOSIS — U071 COVID-19: Secondary | ICD-10-CM | POA: Diagnosis not present

## 2021-07-07 DIAGNOSIS — J9601 Acute respiratory failure with hypoxia: Secondary | ICD-10-CM | POA: Diagnosis not present

## 2021-07-08 DIAGNOSIS — J1282 Pneumonia due to coronavirus disease 2019: Secondary | ICD-10-CM | POA: Diagnosis not present

## 2021-07-08 DIAGNOSIS — J9601 Acute respiratory failure with hypoxia: Secondary | ICD-10-CM | POA: Diagnosis not present

## 2021-07-08 DIAGNOSIS — E1165 Type 2 diabetes mellitus with hyperglycemia: Secondary | ICD-10-CM | POA: Diagnosis not present

## 2021-07-08 DIAGNOSIS — I130 Hypertensive heart and chronic kidney disease with heart failure and stage 1 through stage 4 chronic kidney disease, or unspecified chronic kidney disease: Secondary | ICD-10-CM | POA: Diagnosis not present

## 2021-07-08 DIAGNOSIS — I5022 Chronic systolic (congestive) heart failure: Secondary | ICD-10-CM | POA: Diagnosis not present

## 2021-07-08 DIAGNOSIS — U071 COVID-19: Secondary | ICD-10-CM | POA: Diagnosis not present

## 2021-07-10 DIAGNOSIS — U071 COVID-19: Secondary | ICD-10-CM | POA: Diagnosis not present

## 2021-07-10 DIAGNOSIS — J1282 Pneumonia due to coronavirus disease 2019: Secondary | ICD-10-CM | POA: Diagnosis not present

## 2021-07-10 DIAGNOSIS — E1165 Type 2 diabetes mellitus with hyperglycemia: Secondary | ICD-10-CM | POA: Diagnosis not present

## 2021-07-10 DIAGNOSIS — I5022 Chronic systolic (congestive) heart failure: Secondary | ICD-10-CM | POA: Diagnosis not present

## 2021-07-10 DIAGNOSIS — J9601 Acute respiratory failure with hypoxia: Secondary | ICD-10-CM | POA: Diagnosis not present

## 2021-07-10 DIAGNOSIS — I130 Hypertensive heart and chronic kidney disease with heart failure and stage 1 through stage 4 chronic kidney disease, or unspecified chronic kidney disease: Secondary | ICD-10-CM | POA: Diagnosis not present

## 2021-07-12 DIAGNOSIS — I5032 Chronic diastolic (congestive) heart failure: Secondary | ICD-10-CM | POA: Diagnosis not present

## 2021-07-12 DIAGNOSIS — I110 Hypertensive heart disease with heart failure: Secondary | ICD-10-CM | POA: Diagnosis not present

## 2021-07-12 DIAGNOSIS — L97529 Non-pressure chronic ulcer of other part of left foot with unspecified severity: Secondary | ICD-10-CM | POA: Diagnosis not present

## 2021-07-12 DIAGNOSIS — E11621 Type 2 diabetes mellitus with foot ulcer: Secondary | ICD-10-CM | POA: Diagnosis not present

## 2021-07-12 DIAGNOSIS — E1122 Type 2 diabetes mellitus with diabetic chronic kidney disease: Secondary | ICD-10-CM | POA: Diagnosis not present

## 2021-07-12 DIAGNOSIS — R300 Dysuria: Secondary | ICD-10-CM | POA: Diagnosis not present

## 2021-07-12 DIAGNOSIS — I25119 Atherosclerotic heart disease of native coronary artery with unspecified angina pectoris: Secondary | ICD-10-CM | POA: Diagnosis not present

## 2021-07-13 DIAGNOSIS — E782 Mixed hyperlipidemia: Secondary | ICD-10-CM | POA: Diagnosis not present

## 2021-07-13 DIAGNOSIS — I4891 Unspecified atrial fibrillation: Secondary | ICD-10-CM | POA: Diagnosis not present

## 2021-07-13 DIAGNOSIS — I5022 Chronic systolic (congestive) heart failure: Secondary | ICD-10-CM | POA: Diagnosis not present

## 2021-07-13 DIAGNOSIS — K219 Gastro-esophageal reflux disease without esophagitis: Secondary | ICD-10-CM | POA: Diagnosis not present

## 2021-07-13 DIAGNOSIS — E1142 Type 2 diabetes mellitus with diabetic polyneuropathy: Secondary | ICD-10-CM | POA: Diagnosis not present

## 2021-07-13 DIAGNOSIS — G8929 Other chronic pain: Secondary | ICD-10-CM | POA: Diagnosis not present

## 2021-07-13 DIAGNOSIS — M8580 Other specified disorders of bone density and structure, unspecified site: Secondary | ICD-10-CM | POA: Diagnosis not present

## 2021-07-13 DIAGNOSIS — E559 Vitamin D deficiency, unspecified: Secondary | ICD-10-CM | POA: Diagnosis not present

## 2021-07-13 DIAGNOSIS — I2510 Atherosclerotic heart disease of native coronary artery without angina pectoris: Secondary | ICD-10-CM | POA: Diagnosis not present

## 2021-07-13 DIAGNOSIS — J9601 Acute respiratory failure with hypoxia: Secondary | ICD-10-CM | POA: Diagnosis not present

## 2021-07-13 DIAGNOSIS — M5137 Other intervertebral disc degeneration, lumbosacral region: Secondary | ICD-10-CM | POA: Diagnosis not present

## 2021-07-13 DIAGNOSIS — K760 Fatty (change of) liver, not elsewhere classified: Secondary | ICD-10-CM | POA: Diagnosis not present

## 2021-07-13 DIAGNOSIS — E1122 Type 2 diabetes mellitus with diabetic chronic kidney disease: Secondary | ICD-10-CM | POA: Diagnosis not present

## 2021-07-13 DIAGNOSIS — E1165 Type 2 diabetes mellitus with hyperglycemia: Secondary | ICD-10-CM | POA: Diagnosis not present

## 2021-07-13 DIAGNOSIS — U071 COVID-19: Secondary | ICD-10-CM | POA: Diagnosis not present

## 2021-07-13 DIAGNOSIS — I130 Hypertensive heart and chronic kidney disease with heart failure and stage 1 through stage 4 chronic kidney disease, or unspecified chronic kidney disease: Secondary | ICD-10-CM | POA: Diagnosis not present

## 2021-07-13 DIAGNOSIS — I428 Other cardiomyopathies: Secondary | ICD-10-CM | POA: Diagnosis not present

## 2021-07-13 DIAGNOSIS — F331 Major depressive disorder, recurrent, moderate: Secondary | ICD-10-CM | POA: Diagnosis not present

## 2021-07-13 DIAGNOSIS — E871 Hypo-osmolality and hyponatremia: Secondary | ICD-10-CM | POA: Diagnosis not present

## 2021-07-13 DIAGNOSIS — N1832 Chronic kidney disease, stage 3b: Secondary | ICD-10-CM | POA: Diagnosis not present

## 2021-07-13 DIAGNOSIS — I051 Rheumatic mitral insufficiency: Secondary | ICD-10-CM | POA: Diagnosis not present

## 2021-07-13 DIAGNOSIS — J1282 Pneumonia due to coronavirus disease 2019: Secondary | ICD-10-CM | POA: Diagnosis not present

## 2021-07-13 DIAGNOSIS — E669 Obesity, unspecified: Secondary | ICD-10-CM | POA: Diagnosis not present

## 2021-07-13 DIAGNOSIS — M10372 Gout due to renal impairment, left ankle and foot: Secondary | ICD-10-CM | POA: Diagnosis not present

## 2021-07-13 DIAGNOSIS — M479 Spondylosis, unspecified: Secondary | ICD-10-CM | POA: Diagnosis not present

## 2021-07-14 DIAGNOSIS — U071 COVID-19: Secondary | ICD-10-CM | POA: Diagnosis not present

## 2021-07-14 DIAGNOSIS — I130 Hypertensive heart and chronic kidney disease with heart failure and stage 1 through stage 4 chronic kidney disease, or unspecified chronic kidney disease: Secondary | ICD-10-CM | POA: Diagnosis not present

## 2021-07-14 DIAGNOSIS — J1282 Pneumonia due to coronavirus disease 2019: Secondary | ICD-10-CM | POA: Diagnosis not present

## 2021-07-14 DIAGNOSIS — I5022 Chronic systolic (congestive) heart failure: Secondary | ICD-10-CM | POA: Diagnosis not present

## 2021-07-14 DIAGNOSIS — E1165 Type 2 diabetes mellitus with hyperglycemia: Secondary | ICD-10-CM | POA: Diagnosis not present

## 2021-07-14 DIAGNOSIS — J9601 Acute respiratory failure with hypoxia: Secondary | ICD-10-CM | POA: Diagnosis not present

## 2021-07-20 DIAGNOSIS — I5022 Chronic systolic (congestive) heart failure: Secondary | ICD-10-CM | POA: Diagnosis not present

## 2021-07-20 DIAGNOSIS — E1165 Type 2 diabetes mellitus with hyperglycemia: Secondary | ICD-10-CM | POA: Diagnosis not present

## 2021-07-20 DIAGNOSIS — I130 Hypertensive heart and chronic kidney disease with heart failure and stage 1 through stage 4 chronic kidney disease, or unspecified chronic kidney disease: Secondary | ICD-10-CM | POA: Diagnosis not present

## 2021-07-20 DIAGNOSIS — J1282 Pneumonia due to coronavirus disease 2019: Secondary | ICD-10-CM | POA: Diagnosis not present

## 2021-07-20 DIAGNOSIS — U071 COVID-19: Secondary | ICD-10-CM | POA: Diagnosis not present

## 2021-07-20 DIAGNOSIS — J9601 Acute respiratory failure with hypoxia: Secondary | ICD-10-CM | POA: Diagnosis not present

## 2021-07-23 DIAGNOSIS — I130 Hypertensive heart and chronic kidney disease with heart failure and stage 1 through stage 4 chronic kidney disease, or unspecified chronic kidney disease: Secondary | ICD-10-CM | POA: Diagnosis not present

## 2021-07-23 DIAGNOSIS — J9601 Acute respiratory failure with hypoxia: Secondary | ICD-10-CM | POA: Diagnosis not present

## 2021-07-23 DIAGNOSIS — J1282 Pneumonia due to coronavirus disease 2019: Secondary | ICD-10-CM | POA: Diagnosis not present

## 2021-07-23 DIAGNOSIS — I5022 Chronic systolic (congestive) heart failure: Secondary | ICD-10-CM | POA: Diagnosis not present

## 2021-07-23 DIAGNOSIS — E1165 Type 2 diabetes mellitus with hyperglycemia: Secondary | ICD-10-CM | POA: Diagnosis not present

## 2021-07-23 DIAGNOSIS — U071 COVID-19: Secondary | ICD-10-CM | POA: Diagnosis not present

## 2021-07-27 DIAGNOSIS — J1282 Pneumonia due to coronavirus disease 2019: Secondary | ICD-10-CM | POA: Diagnosis not present

## 2021-07-27 DIAGNOSIS — I130 Hypertensive heart and chronic kidney disease with heart failure and stage 1 through stage 4 chronic kidney disease, or unspecified chronic kidney disease: Secondary | ICD-10-CM | POA: Diagnosis not present

## 2021-07-27 DIAGNOSIS — J9601 Acute respiratory failure with hypoxia: Secondary | ICD-10-CM | POA: Diagnosis not present

## 2021-07-27 DIAGNOSIS — E1165 Type 2 diabetes mellitus with hyperglycemia: Secondary | ICD-10-CM | POA: Diagnosis not present

## 2021-07-27 DIAGNOSIS — I5022 Chronic systolic (congestive) heart failure: Secondary | ICD-10-CM | POA: Diagnosis not present

## 2021-07-27 DIAGNOSIS — U071 COVID-19: Secondary | ICD-10-CM | POA: Diagnosis not present

## 2021-07-29 DIAGNOSIS — Z20822 Contact with and (suspected) exposure to covid-19: Secondary | ICD-10-CM | POA: Diagnosis not present

## 2021-08-07 DIAGNOSIS — J9601 Acute respiratory failure with hypoxia: Secondary | ICD-10-CM | POA: Diagnosis not present

## 2021-08-07 DIAGNOSIS — I5022 Chronic systolic (congestive) heart failure: Secondary | ICD-10-CM | POA: Diagnosis not present

## 2021-08-07 DIAGNOSIS — E1165 Type 2 diabetes mellitus with hyperglycemia: Secondary | ICD-10-CM | POA: Diagnosis not present

## 2021-08-07 DIAGNOSIS — J1282 Pneumonia due to coronavirus disease 2019: Secondary | ICD-10-CM | POA: Diagnosis not present

## 2021-08-07 DIAGNOSIS — U071 COVID-19: Secondary | ICD-10-CM | POA: Diagnosis not present

## 2021-08-07 DIAGNOSIS — I130 Hypertensive heart and chronic kidney disease with heart failure and stage 1 through stage 4 chronic kidney disease, or unspecified chronic kidney disease: Secondary | ICD-10-CM | POA: Diagnosis not present

## 2021-08-08 DIAGNOSIS — Z20822 Contact with and (suspected) exposure to covid-19: Secondary | ICD-10-CM | POA: Diagnosis not present

## 2021-08-10 ENCOUNTER — Ambulatory Visit: Payer: Medicare Other

## 2021-08-11 DIAGNOSIS — J441 Chronic obstructive pulmonary disease with (acute) exacerbation: Secondary | ICD-10-CM | POA: Diagnosis not present

## 2021-08-12 DIAGNOSIS — N1832 Chronic kidney disease, stage 3b: Secondary | ICD-10-CM | POA: Diagnosis not present

## 2021-08-12 DIAGNOSIS — I2510 Atherosclerotic heart disease of native coronary artery without angina pectoris: Secondary | ICD-10-CM | POA: Diagnosis not present

## 2021-08-12 DIAGNOSIS — M5137 Other intervertebral disc degeneration, lumbosacral region: Secondary | ICD-10-CM | POA: Diagnosis not present

## 2021-08-12 DIAGNOSIS — E871 Hypo-osmolality and hyponatremia: Secondary | ICD-10-CM | POA: Diagnosis not present

## 2021-08-12 DIAGNOSIS — I428 Other cardiomyopathies: Secondary | ICD-10-CM | POA: Diagnosis not present

## 2021-08-12 DIAGNOSIS — G8929 Other chronic pain: Secondary | ICD-10-CM | POA: Diagnosis not present

## 2021-08-12 DIAGNOSIS — E669 Obesity, unspecified: Secondary | ICD-10-CM | POA: Diagnosis not present

## 2021-08-12 DIAGNOSIS — M10372 Gout due to renal impairment, left ankle and foot: Secondary | ICD-10-CM | POA: Diagnosis not present

## 2021-08-12 DIAGNOSIS — E1142 Type 2 diabetes mellitus with diabetic polyneuropathy: Secondary | ICD-10-CM | POA: Diagnosis not present

## 2021-08-12 DIAGNOSIS — I051 Rheumatic mitral insufficiency: Secondary | ICD-10-CM | POA: Diagnosis not present

## 2021-08-12 DIAGNOSIS — M8580 Other specified disorders of bone density and structure, unspecified site: Secondary | ICD-10-CM | POA: Diagnosis not present

## 2021-08-12 DIAGNOSIS — K219 Gastro-esophageal reflux disease without esophagitis: Secondary | ICD-10-CM | POA: Diagnosis not present

## 2021-08-12 DIAGNOSIS — E559 Vitamin D deficiency, unspecified: Secondary | ICD-10-CM | POA: Diagnosis not present

## 2021-08-12 DIAGNOSIS — Z9181 History of falling: Secondary | ICD-10-CM | POA: Diagnosis not present

## 2021-08-12 DIAGNOSIS — L97529 Non-pressure chronic ulcer of other part of left foot with unspecified severity: Secondary | ICD-10-CM | POA: Diagnosis not present

## 2021-08-12 DIAGNOSIS — E782 Mixed hyperlipidemia: Secondary | ICD-10-CM | POA: Diagnosis not present

## 2021-08-12 DIAGNOSIS — E1122 Type 2 diabetes mellitus with diabetic chronic kidney disease: Secondary | ICD-10-CM | POA: Diagnosis not present

## 2021-08-12 DIAGNOSIS — I130 Hypertensive heart and chronic kidney disease with heart failure and stage 1 through stage 4 chronic kidney disease, or unspecified chronic kidney disease: Secondary | ICD-10-CM | POA: Diagnosis not present

## 2021-08-12 DIAGNOSIS — M479 Spondylosis, unspecified: Secondary | ICD-10-CM | POA: Diagnosis not present

## 2021-08-12 DIAGNOSIS — F331 Major depressive disorder, recurrent, moderate: Secondary | ICD-10-CM | POA: Diagnosis not present

## 2021-08-12 DIAGNOSIS — I5022 Chronic systolic (congestive) heart failure: Secondary | ICD-10-CM | POA: Diagnosis not present

## 2021-08-12 DIAGNOSIS — I4891 Unspecified atrial fibrillation: Secondary | ICD-10-CM | POA: Diagnosis not present

## 2021-08-12 DIAGNOSIS — K760 Fatty (change of) liver, not elsewhere classified: Secondary | ICD-10-CM | POA: Diagnosis not present

## 2021-08-12 DIAGNOSIS — E1165 Type 2 diabetes mellitus with hyperglycemia: Secondary | ICD-10-CM | POA: Diagnosis not present

## 2021-08-12 DIAGNOSIS — E11621 Type 2 diabetes mellitus with foot ulcer: Secondary | ICD-10-CM | POA: Diagnosis not present

## 2021-08-14 DIAGNOSIS — E876 Hypokalemia: Secondary | ICD-10-CM | POA: Diagnosis not present

## 2021-08-14 DIAGNOSIS — N183 Chronic kidney disease, stage 3 unspecified: Secondary | ICD-10-CM | POA: Diagnosis not present

## 2021-08-14 DIAGNOSIS — R5382 Chronic fatigue, unspecified: Secondary | ICD-10-CM | POA: Diagnosis not present

## 2021-08-14 DIAGNOSIS — E1165 Type 2 diabetes mellitus with hyperglycemia: Secondary | ICD-10-CM | POA: Diagnosis not present

## 2021-08-14 DIAGNOSIS — E1122 Type 2 diabetes mellitus with diabetic chronic kidney disease: Secondary | ICD-10-CM | POA: Diagnosis not present

## 2021-08-14 DIAGNOSIS — D649 Anemia, unspecified: Secondary | ICD-10-CM | POA: Diagnosis not present

## 2021-08-14 DIAGNOSIS — D519 Vitamin B12 deficiency anemia, unspecified: Secondary | ICD-10-CM | POA: Diagnosis not present

## 2021-08-18 DIAGNOSIS — I110 Hypertensive heart disease with heart failure: Secondary | ICD-10-CM | POA: Diagnosis not present

## 2021-08-18 DIAGNOSIS — I25119 Atherosclerotic heart disease of native coronary artery with unspecified angina pectoris: Secondary | ICD-10-CM | POA: Diagnosis not present

## 2021-08-18 DIAGNOSIS — E1122 Type 2 diabetes mellitus with diabetic chronic kidney disease: Secondary | ICD-10-CM | POA: Diagnosis not present

## 2021-08-18 DIAGNOSIS — Z23 Encounter for immunization: Secondary | ICD-10-CM | POA: Diagnosis not present

## 2021-08-18 DIAGNOSIS — R4582 Worries: Secondary | ICD-10-CM | POA: Diagnosis not present

## 2021-08-18 DIAGNOSIS — I5032 Chronic diastolic (congestive) heart failure: Secondary | ICD-10-CM | POA: Diagnosis not present

## 2021-08-18 DIAGNOSIS — E11621 Type 2 diabetes mellitus with foot ulcer: Secondary | ICD-10-CM | POA: Diagnosis not present

## 2021-08-25 DIAGNOSIS — L97529 Non-pressure chronic ulcer of other part of left foot with unspecified severity: Secondary | ICD-10-CM | POA: Diagnosis not present

## 2021-08-25 DIAGNOSIS — E1165 Type 2 diabetes mellitus with hyperglycemia: Secondary | ICD-10-CM | POA: Diagnosis not present

## 2021-08-25 DIAGNOSIS — E11621 Type 2 diabetes mellitus with foot ulcer: Secondary | ICD-10-CM | POA: Diagnosis not present

## 2021-08-25 DIAGNOSIS — E1122 Type 2 diabetes mellitus with diabetic chronic kidney disease: Secondary | ICD-10-CM | POA: Diagnosis not present

## 2021-08-25 DIAGNOSIS — I130 Hypertensive heart and chronic kidney disease with heart failure and stage 1 through stage 4 chronic kidney disease, or unspecified chronic kidney disease: Secondary | ICD-10-CM | POA: Diagnosis not present

## 2021-08-25 DIAGNOSIS — I5022 Chronic systolic (congestive) heart failure: Secondary | ICD-10-CM | POA: Diagnosis not present

## 2021-08-27 DIAGNOSIS — I130 Hypertensive heart and chronic kidney disease with heart failure and stage 1 through stage 4 chronic kidney disease, or unspecified chronic kidney disease: Secondary | ICD-10-CM | POA: Diagnosis not present

## 2021-08-27 DIAGNOSIS — I5022 Chronic systolic (congestive) heart failure: Secondary | ICD-10-CM | POA: Diagnosis not present

## 2021-08-27 DIAGNOSIS — M10372 Gout due to renal impairment, left ankle and foot: Secondary | ICD-10-CM | POA: Diagnosis not present

## 2021-08-27 DIAGNOSIS — E11621 Type 2 diabetes mellitus with foot ulcer: Secondary | ICD-10-CM | POA: Diagnosis not present

## 2021-08-27 DIAGNOSIS — N1832 Chronic kidney disease, stage 3b: Secondary | ICD-10-CM | POA: Diagnosis not present

## 2021-08-27 DIAGNOSIS — E1165 Type 2 diabetes mellitus with hyperglycemia: Secondary | ICD-10-CM | POA: Diagnosis not present

## 2021-08-27 DIAGNOSIS — E1122 Type 2 diabetes mellitus with diabetic chronic kidney disease: Secondary | ICD-10-CM | POA: Diagnosis not present

## 2021-08-27 DIAGNOSIS — L97529 Non-pressure chronic ulcer of other part of left foot with unspecified severity: Secondary | ICD-10-CM | POA: Diagnosis not present

## 2021-09-09 DIAGNOSIS — E1122 Type 2 diabetes mellitus with diabetic chronic kidney disease: Secondary | ICD-10-CM | POA: Diagnosis not present

## 2021-09-09 DIAGNOSIS — I5022 Chronic systolic (congestive) heart failure: Secondary | ICD-10-CM | POA: Diagnosis not present

## 2021-09-09 DIAGNOSIS — E11621 Type 2 diabetes mellitus with foot ulcer: Secondary | ICD-10-CM | POA: Diagnosis not present

## 2021-09-09 DIAGNOSIS — E1165 Type 2 diabetes mellitus with hyperglycemia: Secondary | ICD-10-CM | POA: Diagnosis not present

## 2021-09-09 DIAGNOSIS — I130 Hypertensive heart and chronic kidney disease with heart failure and stage 1 through stage 4 chronic kidney disease, or unspecified chronic kidney disease: Secondary | ICD-10-CM | POA: Diagnosis not present

## 2021-09-09 DIAGNOSIS — L97529 Non-pressure chronic ulcer of other part of left foot with unspecified severity: Secondary | ICD-10-CM | POA: Diagnosis not present

## 2021-09-11 DIAGNOSIS — K219 Gastro-esophageal reflux disease without esophagitis: Secondary | ICD-10-CM | POA: Diagnosis not present

## 2021-09-11 DIAGNOSIS — E559 Vitamin D deficiency, unspecified: Secondary | ICD-10-CM | POA: Diagnosis not present

## 2021-09-11 DIAGNOSIS — M479 Spondylosis, unspecified: Secondary | ICD-10-CM | POA: Diagnosis not present

## 2021-09-11 DIAGNOSIS — E11621 Type 2 diabetes mellitus with foot ulcer: Secondary | ICD-10-CM | POA: Diagnosis not present

## 2021-09-11 DIAGNOSIS — Z9181 History of falling: Secondary | ICD-10-CM | POA: Diagnosis not present

## 2021-09-11 DIAGNOSIS — N1832 Chronic kidney disease, stage 3b: Secondary | ICD-10-CM | POA: Diagnosis not present

## 2021-09-11 DIAGNOSIS — I2510 Atherosclerotic heart disease of native coronary artery without angina pectoris: Secondary | ICD-10-CM | POA: Diagnosis not present

## 2021-09-11 DIAGNOSIS — M5137 Other intervertebral disc degeneration, lumbosacral region: Secondary | ICD-10-CM | POA: Diagnosis not present

## 2021-09-11 DIAGNOSIS — I130 Hypertensive heart and chronic kidney disease with heart failure and stage 1 through stage 4 chronic kidney disease, or unspecified chronic kidney disease: Secondary | ICD-10-CM | POA: Diagnosis not present

## 2021-09-11 DIAGNOSIS — I5022 Chronic systolic (congestive) heart failure: Secondary | ICD-10-CM | POA: Diagnosis not present

## 2021-09-11 DIAGNOSIS — K760 Fatty (change of) liver, not elsewhere classified: Secondary | ICD-10-CM | POA: Diagnosis not present

## 2021-09-11 DIAGNOSIS — G8929 Other chronic pain: Secondary | ICD-10-CM | POA: Diagnosis not present

## 2021-09-11 DIAGNOSIS — E1165 Type 2 diabetes mellitus with hyperglycemia: Secondary | ICD-10-CM | POA: Diagnosis not present

## 2021-09-11 DIAGNOSIS — L97529 Non-pressure chronic ulcer of other part of left foot with unspecified severity: Secondary | ICD-10-CM | POA: Diagnosis not present

## 2021-09-11 DIAGNOSIS — E871 Hypo-osmolality and hyponatremia: Secondary | ICD-10-CM | POA: Diagnosis not present

## 2021-09-11 DIAGNOSIS — I051 Rheumatic mitral insufficiency: Secondary | ICD-10-CM | POA: Diagnosis not present

## 2021-09-11 DIAGNOSIS — I4891 Unspecified atrial fibrillation: Secondary | ICD-10-CM | POA: Diagnosis not present

## 2021-09-11 DIAGNOSIS — E782 Mixed hyperlipidemia: Secondary | ICD-10-CM | POA: Diagnosis not present

## 2021-09-11 DIAGNOSIS — E1142 Type 2 diabetes mellitus with diabetic polyneuropathy: Secondary | ICD-10-CM | POA: Diagnosis not present

## 2021-09-11 DIAGNOSIS — M10372 Gout due to renal impairment, left ankle and foot: Secondary | ICD-10-CM | POA: Diagnosis not present

## 2021-09-11 DIAGNOSIS — F331 Major depressive disorder, recurrent, moderate: Secondary | ICD-10-CM | POA: Diagnosis not present

## 2021-09-11 DIAGNOSIS — E1122 Type 2 diabetes mellitus with diabetic chronic kidney disease: Secondary | ICD-10-CM | POA: Diagnosis not present

## 2021-09-11 DIAGNOSIS — M8580 Other specified disorders of bone density and structure, unspecified site: Secondary | ICD-10-CM | POA: Diagnosis not present

## 2021-09-11 DIAGNOSIS — I428 Other cardiomyopathies: Secondary | ICD-10-CM | POA: Diagnosis not present

## 2021-09-11 DIAGNOSIS — E669 Obesity, unspecified: Secondary | ICD-10-CM | POA: Diagnosis not present

## 2021-09-16 DIAGNOSIS — I130 Hypertensive heart and chronic kidney disease with heart failure and stage 1 through stage 4 chronic kidney disease, or unspecified chronic kidney disease: Secondary | ICD-10-CM | POA: Diagnosis not present

## 2021-09-16 DIAGNOSIS — E1165 Type 2 diabetes mellitus with hyperglycemia: Secondary | ICD-10-CM | POA: Diagnosis not present

## 2021-09-16 DIAGNOSIS — E1122 Type 2 diabetes mellitus with diabetic chronic kidney disease: Secondary | ICD-10-CM | POA: Diagnosis not present

## 2021-09-16 DIAGNOSIS — E11621 Type 2 diabetes mellitus with foot ulcer: Secondary | ICD-10-CM | POA: Diagnosis not present

## 2021-09-16 DIAGNOSIS — I5022 Chronic systolic (congestive) heart failure: Secondary | ICD-10-CM | POA: Diagnosis not present

## 2021-09-16 DIAGNOSIS — L97529 Non-pressure chronic ulcer of other part of left foot with unspecified severity: Secondary | ICD-10-CM | POA: Diagnosis not present

## 2021-09-24 ENCOUNTER — Other Ambulatory Visit (HOSPITAL_COMMUNITY): Payer: Self-pay | Admitting: Internal Medicine

## 2021-09-24 DIAGNOSIS — E11621 Type 2 diabetes mellitus with foot ulcer: Secondary | ICD-10-CM | POA: Diagnosis not present

## 2021-09-24 DIAGNOSIS — E1165 Type 2 diabetes mellitus with hyperglycemia: Secondary | ICD-10-CM | POA: Diagnosis not present

## 2021-09-24 DIAGNOSIS — I130 Hypertensive heart and chronic kidney disease with heart failure and stage 1 through stage 4 chronic kidney disease, or unspecified chronic kidney disease: Secondary | ICD-10-CM | POA: Diagnosis not present

## 2021-09-24 DIAGNOSIS — I5022 Chronic systolic (congestive) heart failure: Secondary | ICD-10-CM | POA: Diagnosis not present

## 2021-09-24 DIAGNOSIS — E1122 Type 2 diabetes mellitus with diabetic chronic kidney disease: Secondary | ICD-10-CM | POA: Diagnosis not present

## 2021-09-24 DIAGNOSIS — L97529 Non-pressure chronic ulcer of other part of left foot with unspecified severity: Secondary | ICD-10-CM | POA: Diagnosis not present

## 2021-10-02 DIAGNOSIS — I130 Hypertensive heart and chronic kidney disease with heart failure and stage 1 through stage 4 chronic kidney disease, or unspecified chronic kidney disease: Secondary | ICD-10-CM | POA: Diagnosis not present

## 2021-10-02 DIAGNOSIS — I5022 Chronic systolic (congestive) heart failure: Secondary | ICD-10-CM | POA: Diagnosis not present

## 2021-10-02 DIAGNOSIS — E1165 Type 2 diabetes mellitus with hyperglycemia: Secondary | ICD-10-CM | POA: Diagnosis not present

## 2021-10-02 DIAGNOSIS — L97529 Non-pressure chronic ulcer of other part of left foot with unspecified severity: Secondary | ICD-10-CM | POA: Diagnosis not present

## 2021-10-02 DIAGNOSIS — E1122 Type 2 diabetes mellitus with diabetic chronic kidney disease: Secondary | ICD-10-CM | POA: Diagnosis not present

## 2021-10-02 DIAGNOSIS — E11621 Type 2 diabetes mellitus with foot ulcer: Secondary | ICD-10-CM | POA: Diagnosis not present

## 2021-10-02 DIAGNOSIS — M10372 Gout due to renal impairment, left ankle and foot: Secondary | ICD-10-CM | POA: Diagnosis not present

## 2021-10-02 DIAGNOSIS — N1832 Chronic kidney disease, stage 3b: Secondary | ICD-10-CM | POA: Diagnosis not present

## 2021-10-09 DIAGNOSIS — L97529 Non-pressure chronic ulcer of other part of left foot with unspecified severity: Secondary | ICD-10-CM | POA: Diagnosis not present

## 2021-10-09 DIAGNOSIS — I130 Hypertensive heart and chronic kidney disease with heart failure and stage 1 through stage 4 chronic kidney disease, or unspecified chronic kidney disease: Secondary | ICD-10-CM | POA: Diagnosis not present

## 2021-10-09 DIAGNOSIS — I5022 Chronic systolic (congestive) heart failure: Secondary | ICD-10-CM | POA: Diagnosis not present

## 2021-10-09 DIAGNOSIS — E1165 Type 2 diabetes mellitus with hyperglycemia: Secondary | ICD-10-CM | POA: Diagnosis not present

## 2021-10-09 DIAGNOSIS — E11621 Type 2 diabetes mellitus with foot ulcer: Secondary | ICD-10-CM | POA: Diagnosis not present

## 2021-10-09 DIAGNOSIS — E1122 Type 2 diabetes mellitus with diabetic chronic kidney disease: Secondary | ICD-10-CM | POA: Diagnosis not present

## 2021-10-12 ENCOUNTER — Other Ambulatory Visit (HOSPITAL_COMMUNITY): Payer: Self-pay | Admitting: Internal Medicine

## 2021-10-12 MED ORDER — POTASSIUM CHLORIDE CRYS ER 20 MEQ PO TBCR: EXTENDED_RELEASE_TABLET | ORAL | 3 refills | Status: DC

## 2021-10-22 ENCOUNTER — Telehealth: Payer: Self-pay | Admitting: Internal Medicine

## 2021-10-22 NOTE — Telephone Encounter (Signed)
I called [employer] to get additional help with patient monitor. They are sending her a new monitor.

## 2021-10-22 NOTE — Telephone Encounter (Signed)
  1. Has your device fired? no  2. Is you device beeping? no  3. Are you experiencing draining or swelling at device site? no  4. Are you calling to see if we received your device transmission? no  5. Have you passed out? No  Patient's daughter called stating she needs help setting her mother's machine back up for her device. She mother had it unplugged.     Please route to [place]

## 2021-11-03 ENCOUNTER — Ambulatory Visit (INDEPENDENT_AMBULATORY_CARE_PROVIDER_SITE_OTHER): Payer: Medicare Other

## 2021-11-03 DIAGNOSIS — I428 Other cardiomyopathies: Secondary | ICD-10-CM | POA: Diagnosis not present

## 2021-11-03 LAB — CUP PACEART REMOTE DEVICE CHECK
Battery Remaining Longevity: 27 mo
Battery Voltage: 2.93 V
Brady Statistic AP VP Percent: 0 %
Brady Statistic AP VS Percent: 0 %
Brady Statistic AS VP Percent: 0 %
Brady Statistic AS VS Percent: 0 %
Brady Statistic RA Percent Paced: 0 %
Brady Statistic RV Percent Paced: 85.14 %
Date Time Interrogation Session: 20230724164754
HighPow Impedance: 70 Ohm
Implantable Lead Implant Date: 20180105
Implantable Lead Implant Date: 20180105
Implantable Lead Location: 753858
Implantable Lead Location: 753860
Implantable Lead Model: 4598
Implantable Pulse Generator Implant Date: 20180105
Lead Channel Impedance Value: 194.634
Lead Channel Impedance Value: 208.568
Lead Channel Impedance Value: 230.98 Ohm
Lead Channel Impedance Value: 237.865
Lead Channel Impedance Value: 250.87 Ohm
Lead Channel Impedance Value: 380 Ohm
Lead Channel Impedance Value: 399 Ohm
Lead Channel Impedance Value: 4047 Ohm
Lead Channel Impedance Value: 437 Ohm
Lead Channel Impedance Value: 513 Ohm
Lead Channel Impedance Value: 589 Ohm
Lead Channel Impedance Value: 589 Ohm
Lead Channel Impedance Value: 646 Ohm
Lead Channel Impedance Value: 703 Ohm
Lead Channel Impedance Value: 703 Ohm
Lead Channel Impedance Value: 912 Ohm
Lead Channel Impedance Value: 912 Ohm
Lead Channel Impedance Value: 950 Ohm
Lead Channel Pacing Threshold Amplitude: 0.5 V
Lead Channel Pacing Threshold Amplitude: 1.25 V
Lead Channel Pacing Threshold Pulse Width: 0.4 ms
Lead Channel Pacing Threshold Pulse Width: 0.8 ms
Lead Channel Sensing Intrinsic Amplitude: 14.875 mV
Lead Channel Sensing Intrinsic Amplitude: 14.875 mV
Lead Channel Setting Pacing Amplitude: 1.75 V
Lead Channel Setting Pacing Amplitude: 2 V
Lead Channel Setting Pacing Pulse Width: 0.4 ms
Lead Channel Setting Pacing Pulse Width: 0.8 ms
Lead Channel Setting Sensing Sensitivity: 0.3 mV

## 2021-11-09 DIAGNOSIS — I5022 Chronic systolic (congestive) heart failure: Secondary | ICD-10-CM | POA: Diagnosis not present

## 2021-11-09 DIAGNOSIS — Z515 Encounter for palliative care: Secondary | ICD-10-CM | POA: Diagnosis not present

## 2021-11-12 DIAGNOSIS — R0683 Snoring: Secondary | ICD-10-CM

## 2021-11-17 ENCOUNTER — Telehealth: Payer: Self-pay

## 2021-11-17 NOTE — Telephone Encounter (Signed)
Transmission received.

## 2021-11-17 NOTE — Telephone Encounter (Signed)
LVM on patients daughter cell phone to call device clinic back, unable to reach patient or leave a VM

## 2021-11-17 NOTE — Telephone Encounter (Signed)
-----   Message from Deboraha Sprang, MD sent at 11/16/2021  4:52 PM EDT ----- Remote reviewed. This remote is abnormal for optiovol crossing K  can yu please ask her to retransmit  Thanks SK

## 2021-11-30 NOTE — Progress Notes (Signed)
Remote ICD transmission.   

## 2021-12-04 ENCOUNTER — Telehealth: Payer: Self-pay | Admitting: *Deleted

## 2021-12-04 NOTE — Patient Outreach (Signed)
  Care Coordination   12/04/2021 Name: Victoria Adams MRN: [id number] DOB: 28-Aug-1940   Care Coordination Outreach Attempts:  An unsuccessful telephone outreach was attempted today to offer the patient information about available care coordination services as a benefit of their health plan.   Follow Up Plan:  Additional outreach attempts will be made to offer the patient care coordination information and services.   Encounter Outcome:  No Answer  Care Coordination Interventions Activated:  No   Care Coordination Interventions:  No, not indicated    Chong Sicilian, BSN, RN-BC RN Care Coordinator Direct Dial: [phone]

## 2021-12-15 ENCOUNTER — Telehealth: Payer: Self-pay | Admitting: *Deleted

## 2021-12-15 NOTE — Patient Outreach (Signed)
  Care Coordination   12/15/2021 Name: Victoria Adams MRN: [id number] DOB: 1940/05/15   Care Coordination Outreach Attempts:  A second unsuccessful outreach was attempted today to offer the patient with information about available care coordination services as a benefit of their health plan.     Follow Up Plan:  Additional outreach attempts will be made to offer the patient care coordination information and services.   Encounter Outcome:  No Answer  Care Coordination Interventions Activated:  No   Care Coordination Interventions:  No, not indicated    Valente David, RN, MSN, [facility] Care Management Care Management Coordinator [phone]'

## 2021-12-17 ENCOUNTER — Telehealth: Payer: Self-pay | Admitting: *Deleted

## 2021-12-17 NOTE — Patient Outreach (Signed)
  Care Coordination   12/17/2021 Name: ANABELL SWINT MRN: [id number] DOB: 1940-08-22   Care Coordination Outreach Attempts:  A third unsuccessful outreach was attempted today to offer the patient with information about available care coordination services as a benefit of their health plan.   Follow Up Plan:  No further outreach attempts will be made at this time. We have been unable to contact the patient to offer or enroll patient in care coordination services  Encounter Outcome:  No Answer  Care Coordination Interventions Activated:  No   Care Coordination Interventions:  No, not indicated    Valente David, RN, MSN, [facility] Care Management Care Management Coordinator [phone]

## 2022-02-01 DIAGNOSIS — E1165 Type 2 diabetes mellitus with hyperglycemia: Secondary | ICD-10-CM | POA: Diagnosis not present

## 2022-02-01 DIAGNOSIS — E7849 Other hyperlipidemia: Secondary | ICD-10-CM | POA: Diagnosis not present

## 2022-02-01 DIAGNOSIS — E871 Hypo-osmolality and hyponatremia: Secondary | ICD-10-CM | POA: Diagnosis not present

## 2022-02-01 DIAGNOSIS — D529 Folate deficiency anemia, unspecified: Secondary | ICD-10-CM | POA: Diagnosis not present

## 2022-02-01 DIAGNOSIS — D519 Vitamin B12 deficiency anemia, unspecified: Secondary | ICD-10-CM | POA: Diagnosis not present

## 2022-02-01 DIAGNOSIS — N183 Chronic kidney disease, stage 3 unspecified: Secondary | ICD-10-CM | POA: Diagnosis not present

## 2022-02-01 DIAGNOSIS — D649 Anemia, unspecified: Secondary | ICD-10-CM | POA: Diagnosis not present

## 2022-02-02 ENCOUNTER — Ambulatory Visit (INDEPENDENT_AMBULATORY_CARE_PROVIDER_SITE_OTHER): Payer: Medicare Other

## 2022-02-02 DIAGNOSIS — I428 Other cardiomyopathies: Secondary | ICD-10-CM | POA: Diagnosis not present

## 2022-02-02 LAB — CUP PACEART REMOTE DEVICE CHECK
Battery Remaining Longevity: 27 mo
Battery Voltage: 2.93 V
Brady Statistic AP VP Percent: 0 %
Brady Statistic AP VS Percent: 0 %
Brady Statistic AS VP Percent: 0 %
Brady Statistic AS VS Percent: 0 %
Brady Statistic RA Percent Paced: 0 %
Brady Statistic RV Percent Paced: 89.26 %
Date Time Interrogation Session: 20231024012303
HighPow Impedance: 80 Ohm
Implantable Lead Connection Status: 753985
Implantable Lead Connection Status: 753985
Implantable Lead Implant Date: 20180105
Implantable Lead Implant Date: 20180105
Implantable Lead Location: 753858
Implantable Lead Location: 753860
Implantable Lead Model: 4598
Implantable Pulse Generator Implant Date: 20180105
Lead Channel Impedance Value: 1026 Ohm
Lead Channel Impedance Value: 1026 Ohm
Lead Channel Impedance Value: 228 Ohm
Lead Channel Impedance Value: 237.12 Ohm
Lead Channel Impedance Value: 267.31 Ohm
Lead Channel Impedance Value: 267.31 Ohm
Lead Channel Impedance Value: 279.933
Lead Channel Impedance Value: 4047 Ohm
Lead Channel Impedance Value: 456 Ohm
Lead Channel Impedance Value: 456 Ohm
Lead Channel Impedance Value: 494 Ohm
Lead Channel Impedance Value: 589 Ohm
Lead Channel Impedance Value: 646 Ohm
Lead Channel Impedance Value: 665 Ohm
Lead Channel Impedance Value: 722 Ohm
Lead Channel Impedance Value: 817 Ohm
Lead Channel Impedance Value: 817 Ohm
Lead Channel Impedance Value: 969 Ohm
Lead Channel Pacing Threshold Amplitude: 0.375 V
Lead Channel Pacing Threshold Amplitude: 1.375 V
Lead Channel Pacing Threshold Pulse Width: 0.4 ms
Lead Channel Pacing Threshold Pulse Width: 0.8 ms
Lead Channel Sensing Intrinsic Amplitude: 17.5 mV
Lead Channel Sensing Intrinsic Amplitude: 17.5 mV
Lead Channel Setting Pacing Amplitude: 2 V
Lead Channel Setting Pacing Amplitude: 2 V
Lead Channel Setting Pacing Pulse Width: 0.4 ms
Lead Channel Setting Pacing Pulse Width: 0.8 ms
Lead Channel Setting Sensing Sensitivity: 0.3 mV
Zone Setting Status: 755011

## 2022-02-08 DIAGNOSIS — I110 Hypertensive heart disease with heart failure: Secondary | ICD-10-CM | POA: Diagnosis not present

## 2022-02-08 DIAGNOSIS — Z23 Encounter for immunization: Secondary | ICD-10-CM | POA: Diagnosis not present

## 2022-02-08 DIAGNOSIS — R5382 Chronic fatigue, unspecified: Secondary | ICD-10-CM | POA: Diagnosis not present

## 2022-02-08 DIAGNOSIS — I5032 Chronic diastolic (congestive) heart failure: Secondary | ICD-10-CM | POA: Diagnosis not present

## 2022-02-08 DIAGNOSIS — E1122 Type 2 diabetes mellitus with diabetic chronic kidney disease: Secondary | ICD-10-CM | POA: Diagnosis not present

## 2022-02-08 DIAGNOSIS — R4582 Worries: Secondary | ICD-10-CM | POA: Diagnosis not present

## 2022-02-08 DIAGNOSIS — L97529 Non-pressure chronic ulcer of other part of left foot with unspecified severity: Secondary | ICD-10-CM | POA: Diagnosis not present

## 2022-02-08 DIAGNOSIS — I482 Chronic atrial fibrillation, unspecified: Secondary | ICD-10-CM | POA: Diagnosis not present

## 2022-02-08 DIAGNOSIS — Z0001 Encounter for general adult medical examination with abnormal findings: Secondary | ICD-10-CM | POA: Diagnosis not present

## 2022-02-08 DIAGNOSIS — I25119 Atherosclerotic heart disease of native coronary artery with unspecified angina pectoris: Secondary | ICD-10-CM | POA: Diagnosis not present

## 2022-02-08 DIAGNOSIS — E1342 Other specified diabetes mellitus with diabetic polyneuropathy: Secondary | ICD-10-CM | POA: Diagnosis not present

## 2022-02-08 DIAGNOSIS — E7849 Other hyperlipidemia: Secondary | ICD-10-CM | POA: Diagnosis not present

## 2022-02-19 NOTE — Progress Notes (Signed)
Remote ICD transmission.   

## 2022-05-04 ENCOUNTER — Ambulatory Visit: Payer: Medicare Other | Attending: Internal Medicine

## 2022-05-04 DIAGNOSIS — I428 Other cardiomyopathies: Secondary | ICD-10-CM

## 2022-05-04 LAB — CUP PACEART REMOTE DEVICE CHECK
Battery Remaining Longevity: 25 mo
Battery Voltage: 2.92 V
Brady Statistic AP VP Percent: 0 %
Brady Statistic AP VS Percent: 0 %
Brady Statistic AS VP Percent: 0 %
Brady Statistic AS VS Percent: 0 %
Brady Statistic RA Percent Paced: 0 %
Brady Statistic RV Percent Paced: 90.41 %
Date Time Interrogation Session: 20240123102406
HighPow Impedance: 79 Ohm
Implantable Lead Connection Status: 753985
Implantable Lead Connection Status: 753985
Implantable Lead Implant Date: 20180105
Implantable Lead Implant Date: 20180105
Implantable Lead Location: 753858
Implantable Lead Location: 753860
Implantable Lead Model: 4598
Implantable Pulse Generator Implant Date: 20180105
Lead Channel Impedance Value: 1045 Ohm
Lead Channel Impedance Value: 1045 Ohm
Lead Channel Impedance Value: 1045 Ohm
Lead Channel Impedance Value: 228 Ohm
Lead Channel Impedance Value: 237.12 Ohm
Lead Channel Impedance Value: 267.31 Ohm
Lead Channel Impedance Value: 267.31 Ohm
Lead Channel Impedance Value: 279.933
Lead Channel Impedance Value: 4047 Ohm
Lead Channel Impedance Value: 456 Ohm
Lead Channel Impedance Value: 456 Ohm
Lead Channel Impedance Value: 494 Ohm
Lead Channel Impedance Value: 589 Ohm
Lead Channel Impedance Value: 646 Ohm
Lead Channel Impedance Value: 703 Ohm
Lead Channel Impedance Value: 760 Ohm
Lead Channel Impedance Value: 836 Ohm
Lead Channel Impedance Value: 893 Ohm
Lead Channel Pacing Threshold Amplitude: 0.375 V
Lead Channel Pacing Threshold Amplitude: 1.625 V
Lead Channel Pacing Threshold Pulse Width: 0.4 ms
Lead Channel Pacing Threshold Pulse Width: 0.8 ms
Lead Channel Sensing Intrinsic Amplitude: 18.5 mV
Lead Channel Sensing Intrinsic Amplitude: 18.5 mV
Lead Channel Setting Pacing Amplitude: 2 V
Lead Channel Setting Pacing Amplitude: 2.25 V
Lead Channel Setting Pacing Pulse Width: 0.4 ms
Lead Channel Setting Pacing Pulse Width: 0.8 ms
Lead Channel Setting Sensing Sensitivity: 0.3 mV
Zone Setting Status: 755011

## 2022-05-31 NOTE — Progress Notes (Signed)
Remote ICD transmission.   

## 2022-06-04 DIAGNOSIS — E7849 Other hyperlipidemia: Secondary | ICD-10-CM | POA: Diagnosis not present

## 2022-06-04 DIAGNOSIS — E039 Hypothyroidism, unspecified: Secondary | ICD-10-CM | POA: Diagnosis not present

## 2022-06-04 DIAGNOSIS — E1165 Type 2 diabetes mellitus with hyperglycemia: Secondary | ICD-10-CM | POA: Diagnosis not present

## 2022-06-04 DIAGNOSIS — E871 Hypo-osmolality and hyponatremia: Secondary | ICD-10-CM | POA: Diagnosis not present

## 2022-06-04 DIAGNOSIS — N183 Chronic kidney disease, stage 3 unspecified: Secondary | ICD-10-CM | POA: Diagnosis not present

## 2022-06-04 DIAGNOSIS — I10 Essential (primary) hypertension: Secondary | ICD-10-CM | POA: Diagnosis not present

## 2022-06-04 DIAGNOSIS — E1122 Type 2 diabetes mellitus with diabetic chronic kidney disease: Secondary | ICD-10-CM | POA: Diagnosis not present

## 2022-06-04 DIAGNOSIS — K219 Gastro-esophageal reflux disease without esophagitis: Secondary | ICD-10-CM | POA: Diagnosis not present

## 2022-06-11 DIAGNOSIS — R1084 Generalized abdominal pain: Secondary | ICD-10-CM | POA: Diagnosis not present

## 2022-06-11 DIAGNOSIS — Z23 Encounter for immunization: Secondary | ICD-10-CM | POA: Diagnosis not present

## 2022-06-11 DIAGNOSIS — E1122 Type 2 diabetes mellitus with diabetic chronic kidney disease: Secondary | ICD-10-CM | POA: Diagnosis not present

## 2022-06-11 DIAGNOSIS — Z79891 Long term (current) use of opiate analgesic: Secondary | ICD-10-CM | POA: Diagnosis not present

## 2022-06-11 DIAGNOSIS — I25119 Atherosclerotic heart disease of native coronary artery with unspecified angina pectoris: Secondary | ICD-10-CM | POA: Diagnosis not present

## 2022-06-11 DIAGNOSIS — R4582 Worries: Secondary | ICD-10-CM | POA: Diagnosis not present

## 2022-06-11 DIAGNOSIS — I5032 Chronic diastolic (congestive) heart failure: Secondary | ICD-10-CM | POA: Diagnosis not present

## 2022-06-11 DIAGNOSIS — E1342 Other specified diabetes mellitus with diabetic polyneuropathy: Secondary | ICD-10-CM | POA: Diagnosis not present

## 2022-06-11 DIAGNOSIS — I482 Chronic atrial fibrillation, unspecified: Secondary | ICD-10-CM | POA: Diagnosis not present

## 2022-06-11 DIAGNOSIS — I110 Hypertensive heart disease with heart failure: Secondary | ICD-10-CM | POA: Diagnosis not present

## 2022-06-11 DIAGNOSIS — E7849 Other hyperlipidemia: Secondary | ICD-10-CM | POA: Diagnosis not present

## 2022-06-11 DIAGNOSIS — E11621 Type 2 diabetes mellitus with foot ulcer: Secondary | ICD-10-CM | POA: Diagnosis not present

## 2022-06-19 ENCOUNTER — Other Ambulatory Visit (HOSPITAL_COMMUNITY): Payer: Self-pay | Admitting: Internal Medicine

## 2022-06-21 ENCOUNTER — Other Ambulatory Visit (HOSPITAL_COMMUNITY): Payer: Self-pay | Admitting: Internal Medicine

## 2022-08-03 ENCOUNTER — Ambulatory Visit (INDEPENDENT_AMBULATORY_CARE_PROVIDER_SITE_OTHER): Payer: Medicare Other

## 2022-08-03 DIAGNOSIS — I428 Other cardiomyopathies: Secondary | ICD-10-CM | POA: Diagnosis not present

## 2022-08-04 ENCOUNTER — Telehealth: Payer: Self-pay

## 2022-08-04 LAB — CUP PACEART REMOTE DEVICE CHECK
Battery Remaining Longevity: 23 mo
Battery Voltage: 2.91 V
Brady Statistic AP VP Percent: 0 %
Brady Statistic AP VS Percent: 0 %
Brady Statistic AS VP Percent: 0 %
Brady Statistic AS VS Percent: 0 %
Brady Statistic RA Percent Paced: 0 %
Brady Statistic RV Percent Paced: 90.45 %
Date Time Interrogation Session: 20240423033423
HighPow Impedance: 67 Ohm
Implantable Lead Connection Status: 753985
Implantable Lead Connection Status: 753985
Implantable Lead Implant Date: 20180105
Implantable Lead Implant Date: 20180105
Implantable Lead Location: 753858
Implantable Lead Location: 753860
Implantable Lead Model: 4598
Implantable Pulse Generator Implant Date: 20180105
Lead Channel Impedance Value: 218.5 Ohm
Lead Channel Impedance Value: 218.5 Ohm
Lead Channel Impedance Value: 260.667
Lead Channel Impedance Value: 260.667
Lead Channel Impedance Value: 260.667
Lead Channel Impedance Value: 4047 Ohm
Lead Channel Impedance Value: 437 Ohm
Lead Channel Impedance Value: 437 Ohm
Lead Channel Impedance Value: 437 Ohm
Lead Channel Impedance Value: 437 Ohm
Lead Channel Impedance Value: 513 Ohm
Lead Channel Impedance Value: 532 Ohm
Lead Channel Impedance Value: 646 Ohm
Lead Channel Impedance Value: 722 Ohm
Lead Channel Impedance Value: 760 Ohm
Lead Channel Impedance Value: 950 Ohm
Lead Channel Impedance Value: 969 Ohm
Lead Channel Impedance Value: 969 Ohm
Lead Channel Pacing Threshold Amplitude: 0.5 V
Lead Channel Pacing Threshold Amplitude: 1.5 V
Lead Channel Pacing Threshold Pulse Width: 0.4 ms
Lead Channel Pacing Threshold Pulse Width: 0.8 ms
Lead Channel Sensing Intrinsic Amplitude: 14.75 mV
Lead Channel Sensing Intrinsic Amplitude: 14.75 mV
Lead Channel Setting Pacing Amplitude: 2 V
Lead Channel Setting Pacing Amplitude: 2 V
Lead Channel Setting Pacing Pulse Width: 0.4 ms
Lead Channel Setting Pacing Pulse Width: 0.8 ms
Lead Channel Setting Sensing Sensitivity: 0.3 mV
Zone Setting Status: 755011

## 2022-08-04 NOTE — Telephone Encounter (Signed)
Following alert received from [employer] received for Bi-ventricular pacing effectinvely 87.8%. Optivol had a recent elevation and is still ongoing, sent to triage.  Attempted to contact patient. No answer, LMTCB.

## 2022-08-05 NOTE — Telephone Encounter (Signed)
Pt overdue for follow up.  Last seen 2022.  Message sent to scheduler to schedule follow up.

## 2022-08-06 NOTE — Telephone Encounter (Signed)
Appt schedule with EP APP in June per family request.  Continue to monitor.

## 2022-08-31 NOTE — Progress Notes (Signed)
Remote ICD transmission.   

## 2022-09-20 DIAGNOSIS — R4582 Worries: Secondary | ICD-10-CM | POA: Diagnosis not present

## 2022-09-20 DIAGNOSIS — I5032 Chronic diastolic (congestive) heart failure: Secondary | ICD-10-CM | POA: Diagnosis not present

## 2022-09-20 DIAGNOSIS — I25119 Atherosclerotic heart disease of native coronary artery with unspecified angina pectoris: Secondary | ICD-10-CM | POA: Diagnosis not present

## 2022-09-20 DIAGNOSIS — I482 Chronic atrial fibrillation, unspecified: Secondary | ICD-10-CM | POA: Diagnosis not present

## 2022-09-20 DIAGNOSIS — E11621 Type 2 diabetes mellitus with foot ulcer: Secondary | ICD-10-CM | POA: Diagnosis not present

## 2022-09-20 DIAGNOSIS — I110 Hypertensive heart disease with heart failure: Secondary | ICD-10-CM | POA: Diagnosis not present

## 2022-09-20 DIAGNOSIS — E1122 Type 2 diabetes mellitus with diabetic chronic kidney disease: Secondary | ICD-10-CM | POA: Diagnosis not present

## 2022-09-20 DIAGNOSIS — E7849 Other hyperlipidemia: Secondary | ICD-10-CM | POA: Diagnosis not present

## 2022-09-20 DIAGNOSIS — F331 Major depressive disorder, recurrent, moderate: Secondary | ICD-10-CM | POA: Diagnosis not present

## 2022-09-20 DIAGNOSIS — E1342 Other specified diabetes mellitus with diabetic polyneuropathy: Secondary | ICD-10-CM | POA: Diagnosis not present

## 2022-09-20 DIAGNOSIS — R1084 Generalized abdominal pain: Secondary | ICD-10-CM | POA: Diagnosis not present

## 2022-09-28 NOTE — Progress Notes (Deleted)
Cardiology Office Note Date:  09/28/2022  Patient ID:  Victoria Adams, DOB [birth year], MRN [id number] PCP:  [facility]  Cardiologist:  Dr. Gala Romney Electrophysiologist: Dr. Graciela Husbands   Chief Complaint:  *** over due  History of Present Illness: Victoria Adams is a 82 y.o. female with history of morbid obesity HTN, DM, Afib (permanent), CAD, NICM, chronic CHF (systolic), LBBB, chronic back pain, stroke (hemorrhagic), CAD, lymphedema  She saw Dr. Graciela Husbands 2018, discussed significant PVC burden though in the environment of normalized LVEF and diminished CRT pacing to mid 90's because of that, no intervention/particular treatment for them.  she was admitted to [facility] 07/14/20 for HF exacerbation and R foot wound after a cat scratch.  IV diuretics and IV antibiotics given.  Foot required bedside surgical I&D Home on 07/20/20 with plans for wound care clinic follow up Coreg stopped and started metoprolol  succ and started on losartan and ASA 325mg  daily I do not see that her home diuretics were changed (daughter confirmed) Blood Cx were neg x2 for 5 days She had some electrolyte imbalances and AKI during her stay TTE noted LVEF 55-60%, mild RV dilation normal RVEF, LA severely dilated, mod TR, PA , RA severely dilated  She saw A. Tillery, PA-C 08/01/20, noted dietary indiscretions, effective BP remained low 2/2 PVCs but improved with increased base pacing rate at her prior visit. Volume OL, meds adjusted  She saw Dr. Gala Romney 08/21/20, poor gait post stroke, family urging ALF but pt resistant. Volume OL, ongoing dietary noncompliance  F/u with Mardelle Matte 5/13, , no significant clinical improvement, pending home PT via PMD, BP% 88  No outpt follow up since with either clinic  *** + remotes *** chronic low BP% *** PVCs *** diet, living at home? *** needs SK next *** needs HF team *** eliquis, dose, bleeding, labs   Device information MDR CRT -D, implanted /08/2016   Past  Medical History:  Diagnosis Date   Allergic rhinitis    Arthritis    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 echo (5/15):  mod LVH, EF 20-25%, diff HK, severe LAE, mild RAE   Coronary atherosclerosis-non obstructive    LHC (5/15):  EF 40-45% global HK; long LAD 40-60%, ostial 1st major septal perf 80-90%, ostial CFX ?%, mid AVCFX extensive Ca2+, dist PDA 50%   Depression    Essential hypertension, benign    Fatty liver disease, nonalcoholic    GERD (gastroesophageal reflux disease)    Iron deficiency anemia    Mitral regurgitation    NICM (nonischemic cardiomyopathy) (HCC)    a. echo (5/15):  mod LVH, EF 20-25%, diff HK, severe LAE, mild RAE   NSTEMI (non-ST elevated myocardial infarction) (HCC) may 2015   No CAD   Obesity    Osteopenia    PONV (postoperative nausea and vomiting)    Rosacea    Type 2 diabetes mellitus (HCC)    Vitamin D deficiency     Past Surgical History:  Procedure Laterality Date   ABDOMINAL HYSTERECTOMY     APPENDECTOMY     BREAST BIOPSY     RIGHT   CARDIAC CATHETERIZATION  2010   LAD 50%, CFX 30%, RCA 40%; EF by echo 25-35%   CATARACT EXTRACTION Bilateral    CHOLECYSTECTOMY  COLONOSCOPY WITH PROPOFOL N/A 12/20/2013   Procedure: COLONOSCOPY WITH PROPOFOL;  Surgeon: Rachael Fee, MD;  Location: [facility] ENDOSCOPY;  Service: Endoscopy;  Laterality: N/A;   EP IMPLANTABLE DEVICE N/A 04/16/2016   Procedure: BiV ICD Insertion CRT-D;  Surgeon: Duke Salvia, MD;  Location: [facility] INVASIVE CV LAB;  Service: Cardiovascular;  Laterality: N/A;   ESOPHAGOGASTRODUODENOSCOPY N/A 08/25/2013   Procedure: ESOPHAGOGASTRODUODENOSCOPY (EGD);  Surgeon: Rachael Fee, MD;  Location: [facility] ENDOSCOPY;  Service: Endoscopy;  Laterality: N/A;   LEFT HEART CATHETERIZATION WITH CORONARY ANGIOGRAM N/A 08/24/2013   Procedure: LEFT HEART CATHETERIZATION  WITH CORONARY ANGIOGRAM;  Surgeon: Marykay Lex, MD;  Location: [facility] CATH LAB;  Service: Cardiovascular;  Laterality: N/A;   LUMBAR PERCUTANEOUS PEDICLE SCREW 2 LEVEL N/A 02/15/2019   Procedure: Percutaneous Pedicle Screw Fixation from Thoracic Ten-Thoracic Twelve with Methylmethacrylate Screw Augmentation;  Surgeon: Julio Sicks, MD;  Location: [facility] OR;  Service: Neurosurgery;  Laterality: N/A;   ROTATOR CUFF REPAIR     RIGHT SHOULDER   TONSILLECTOMY  yrs ago   TOTAL ABDOMINAL HYSTERECTOMY W/ BILATERAL SALPINGOOPHORECTOMY     for dermoid tumor    Current Outpatient Medications  Medication Sig Dispense Refill   acetaminophen (TYLENOL) 325 MG tablet Take 650 mg by mouth every 6 (six) hours as needed for mild pain.     albuterol (VENTOLIN HFA) 108 (90 Base) MCG/ACT inhaler Inhale 2 puffs into the lungs every 6 (six) hours as needed for wheezing or shortness of breath. 1 each 2   apixaban (ELIQUIS) 5 MG TABS tablet Take 1 tablet (5 mg total) by mouth 2 (two) times daily. 60 tablet 3   ascorbic acid (VITAMIN C) 500 MG tablet Take 1 tablet (500 mg total) by mouth daily. 30 tablet 1   atorvastatin (LIPITOR) 80 MG tablet Take 80 mg by mouth daily.     busPIRone (BUSPAR) 7.5 MG tablet Take 7.5 mg by mouth 2 (two) times daily.     dextromethorphan-guaiFENesin (MUCINEX DM) 30-600 MG 12hr tablet Take 1 tablet by mouth 2 (two) times daily. 30 tablet 2   doxycycline (VIBRAMYCIN) 100 MG capsule Take 100-200 mg by mouth 2 (two) times daily as needed (rosacea).     DULoxetine (CYMBALTA) 60 MG capsule Take 60 mg by mouth daily.     ferrous sulfate 325 (65 FE) MG tablet Take 1 tablet (325 mg total) by mouth daily with breakfast. 30 tablet 3   guaiFENesin-dextromethorphan (ROBITUSSIN DM) 100-10 MG/5ML syrup Take 10 mLs by mouth every 4 (four) hours as needed for cough. 118 mL 0   HYDROcodone-acetaminophen (NORCO/VICODIN) 5-325 MG tablet Take 2 tablets by mouth every 12 (twelve) hours as needed for moderate pain. 10  tablet 0   insulin aspart (NOVOLOG) 100 UNIT/ML FlexPen Inject 0-10 Units into the skin 3 (three) times daily with meals. insulin aspart (novoLOG) injection 0-10 Units 0-10 Units Subcutaneous, 3 times daily with meals CBG < 70: Implement Hypoglycemia Standing Orders and refer to Hypoglycemia Standing Orders sidebar report  CBG 70 - 120: 0 unit CBG 121 - 150: 0 unit  CBG 151 - 200: 1 unit CBG 201 - 250: 2 units CBG 251 - 300: 4 units CBG 301 - 350: 6 units  CBG 351 - 400: 8 units  CBG > 400: 10 units 15 mL 11   insulin glargine (LANTUS) 100 UNIT/ML injection Inject 0.32 mLs (32 Units total) into the skin at bedtime. 10 mL 11   linagliptin (TRADJENTA) 5 MG TABS tablet Take 1  tablet (5 mg total) by mouth daily. 30 tablet 5   LORazepam (ATIVAN) 0.5 MG tablet Take 1 tablet (0.5 mg total) by mouth at bedtime. 30 tablet 0   Magnesium Oxide 400 MG CAPS Take 400 mg by mouth 2 (two) times daily.     metolazone (ZAROXOLYN) 2.5 MG tablet Take 1 tablet (2.5 mg total) by mouth every Monday, Wednesday, and Friday. 15 tablet 1   metoprolol succinate (TOPROL-XL) 50 MG 24 hr tablet Take 1 tablet (50 mg total) by mouth daily. 30 tablet 3   mirtazapine (REMERON) 30 MG tablet Take 1 tablet (30 mg total) by mouth at bedtime. 30 tablet 3   multivitamin-lutein (OCUVITE-LUTEIN) CAPS capsule Take 1 capsule by mouth 2 (two) times daily.     omeprazole (PRILOSEC) 20 MG capsule Take 1 capsule (20 mg total) by mouth daily. 30 capsule 3   ondansetron (ZOFRAN ODT) 4 MG disintegrating tablet Take 1 tablet (4 mg total) by mouth every 8 (eight) hours as needed for nausea or vomiting. 20 tablet 0   potassium chloride SA (KLOR-CON M) 20 MEQ tablet TAKE 3 TABLETS BY MOUTH TWICE DAILY 270 tablet 3   torsemide (DEMADEX) 20 MG tablet TAKE 4 TABLETS TWICE DAILY. 240 tablet 0   zinc sulfate 220 (50 Zn) MG capsule Take 1 capsule (220 mg total) by mouth daily. 30 capsule 1   No current facility-administered medications for this visit.     Allergies:   Acetaminophen, Codeine, and Tylox [oxycodone-acetaminophen]   Social History:  The patient  reports that she has never smoked. She has never used smokeless tobacco. She reports that she does not drink alcohol and does not use drugs.   Family History:  The patient's family history includes Asthma in her brother; Coronary artery disease in her father; Diabetes in her father; Lung cancer in her father and mother; Other in her grandchild.  ROS:  Please see the history of present illness.    All other systems are reviewed and otherwise negative.   PHYSICAL EXAM:  VS:  There were no vitals taken for this visit. BMI: There is no height or weight on file to calculate BMI. Well nourished, well developed, in no acute distress HEENT: normocephalic, atraumatic Neck: no JVD in a seated position, no carotid bruits or masses Cardiac:  *** RRR; *** w/extrasystoles no significant murmurs, no rubs, or gallops Lungs: *** CTA b/l, no wheezing, rhonchi or rales Abd: soft, nontender MS: no deformity or atrophy Ext: *** RLE wrapped to mid shin, exposed foot dorsum is erythematous and swollen, RLE with ted ose on with 1-2+ edema, hose removed skin looks good Skin: warm and dry, no rash Neuro:  No gross deficits appreciated Psych: euthymic mood, full affect  *** ICD site is stable, no tethering or discomfort   EKG:  Done today and reviewed by myself shows  ***  Device interrogation done today and reviewed by myself:  ***   07/15/2020: TTE Summary    1. The left ventricle is normal in size with mildly increased wall  thickness.    2. The left ventricular systolic function is normal, LVEF is visually  estimated at 55-60%.    3. There is mild to moderate mitral valve regurgitation.    4. The left atrium is severely dilated in size.    5. The right ventricle is mildly dilated in size, with normal systolic  function.    6. There is moderate tricuspid regurgitation.    7. There is mild  pulmonary hypertension, estimated pulmonary artery systolic  pressure is 45 mmHg.    8. The right atrium is severely dilated  in size.     05/08/20: TTE IMPRESSIONS   1. Left ventricular ejection fraction, by estimation, is 55 to 60%. The  left ventricle has normal function. The left ventricle has no regional  wall motion abnormalities. The left ventricular internal cavity size was  mildly dilated. Left ventricular  diastolic parameters are indeterminate.   2. Right ventricular systolic function is normal. The right ventricular  size is normal. There is mildly elevated pulmonary artery systolic  pressure.   3. Left atrial size was mildly dilated.   4. The mitral valve is normal in structure. Trivial mitral valve  regurgitation. No evidence of mitral stenosis.   5. The aortic valve is tricuspid. Aortic valve regurgitation is not  visualized. Mild to moderate aortic valve sclerosis/calcification is  present, without any evidence of aortic stenosis.   6. The inferior vena cava is normal in size with greater than 50%  respiratory variability, suggesting right atrial pressure of 3 mmHg.    02/08/2019: LVEF 60-65% 06/15/16: LVEF 60-65% 2017 LVEF 20-25%  Recent Labs: No results found for requested labs within last 365 days.  No results found for requested labs within last 365 days.   CrCl cannot be calculated (Patient's most recent lab result is older than the maximum 21 days allowed.).   Wt Readings from Last 3 Encounters:  05/09/21 214 lb 15.2 oz (97.5 kg)  01/21/21 234 lb (106.1 kg)  09/22/20 232 lb (105.2 kg)     Other studies reviewed: Additional studies/records reviewed today include: summarized above  ASSESSMENT AND PLAN:  1. CRT-D     *** Stable measurements     *** No arrhythmias    2. NICM 3. Chronic CHF     Chronic low BP%     *** LVEF improved/normalized by her echo 2022     ***        4. CAD     Moderate diffuse mid LAD disease of 40-60%. Septal  perforator branch with ostial ~90%    *** No anginal symptoms  5. Permanent AFib     CHA2DS2Vasc is 8, on eliquis, *** appropriately dosed        Disposition: F***  Current medicines are reviewed at length with the patient today.  The patient did not have any concerns regarding medicines.  Norma Fredrickson, PA-C 09/28/2022 10:02 AM     [facility] [address] [place] [address] [phone] (office)  [phone] (fax)

## 2022-09-29 ENCOUNTER — Ambulatory Visit: Payer: Medicare Other | Admitting: Physician Assistant

## 2022-10-26 ENCOUNTER — Other Ambulatory Visit (HOSPITAL_COMMUNITY): Payer: Self-pay | Admitting: Cardiology

## 2022-10-26 MED ORDER — POTASSIUM CHLORIDE CRYS ER 20 MEQ PO TBCR: EXTENDED_RELEASE_TABLET | ORAL | 3 refills | Status: DC

## 2022-11-02 ENCOUNTER — Ambulatory Visit (INDEPENDENT_AMBULATORY_CARE_PROVIDER_SITE_OTHER): Payer: Medicare Other

## 2022-11-02 DIAGNOSIS — I428 Other cardiomyopathies: Secondary | ICD-10-CM | POA: Diagnosis not present

## 2022-11-03 LAB — CUP PACEART REMOTE DEVICE CHECK
Battery Remaining Longevity: 20 mo
Battery Voltage: 2.91 V
Brady Statistic AP VP Percent: 0 %
Brady Statistic AP VS Percent: 0 %
Brady Statistic AS VP Percent: 0 %
Brady Statistic AS VS Percent: 0 %
Brady Statistic RA Percent Paced: 0 %
Brady Statistic RV Percent Paced: 91.93 %
Date Time Interrogation Session: 20240723082732
HighPow Impedance: 81 Ohm
Implantable Lead Connection Status: 753985
Implantable Lead Connection Status: 753985
Implantable Lead Implant Date: 20180105
Implantable Lead Implant Date: 20180105
Implantable Lead Location: 753858
Implantable Lead Location: 753860
Implantable Lead Model: 4598
Implantable Pulse Generator Implant Date: 20180105
Lead Channel Impedance Value: 1026 Ohm
Lead Channel Impedance Value: 1045 Ohm
Lead Channel Impedance Value: 1045 Ohm
Lead Channel Impedance Value: 218.5 Ohm
Lead Channel Impedance Value: 223.149
Lead Channel Impedance Value: 269.483
Lead Channel Impedance Value: 269.483
Lead Channel Impedance Value: 276.59 Ohm
Lead Channel Impedance Value: 4047 Ohm
Lead Channel Impedance Value: 437 Ohm
Lead Channel Impedance Value: 437 Ohm
Lead Channel Impedance Value: 456 Ohm
Lead Channel Impedance Value: 570 Ohm
Lead Channel Impedance Value: 627 Ohm
Lead Channel Impedance Value: 703 Ohm
Lead Channel Impedance Value: 703 Ohm
Lead Channel Impedance Value: 703 Ohm
Lead Channel Impedance Value: 760 Ohm
Lead Channel Pacing Threshold Amplitude: 0.5 V
Lead Channel Pacing Threshold Amplitude: 1.75 V
Lead Channel Pacing Threshold Pulse Width: 0.4 ms
Lead Channel Pacing Threshold Pulse Width: 0.8 ms
Lead Channel Sensing Intrinsic Amplitude: 22.125 mV
Lead Channel Sensing Intrinsic Amplitude: 22.125 mV
Lead Channel Setting Pacing Amplitude: 2 V
Lead Channel Setting Pacing Amplitude: 2.25 V
Lead Channel Setting Pacing Pulse Width: 0.4 ms
Lead Channel Setting Pacing Pulse Width: 0.8 ms
Lead Channel Setting Sensing Sensitivity: 0.3 mV
Zone Setting Status: 755011

## 2022-11-16 ENCOUNTER — Ambulatory Visit: Payer: Medicare Other | Attending: Physician Assistant | Admitting: Student

## 2022-11-16 ENCOUNTER — Encounter: Payer: Self-pay | Admitting: Student

## 2022-11-16 DIAGNOSIS — I428 Other cardiomyopathies: Secondary | ICD-10-CM

## 2022-11-16 DIAGNOSIS — I5022 Chronic systolic (congestive) heart failure: Secondary | ICD-10-CM

## 2022-11-16 DIAGNOSIS — I482 Chronic atrial fibrillation, unspecified: Secondary | ICD-10-CM

## 2022-11-16 DIAGNOSIS — D6869 Other thrombophilia: Secondary | ICD-10-CM

## 2022-11-16 DIAGNOSIS — I447 Left bundle-branch block, unspecified: Secondary | ICD-10-CM

## 2022-11-16 DIAGNOSIS — I10 Essential (primary) hypertension: Secondary | ICD-10-CM

## 2022-11-16 NOTE — Progress Notes (Deleted)
  Electrophysiology Office Note:   Date:  11/16/2022  ID:  [name], DOB [birth year], MRN [id number]  Primary Cardiologist: Arvilla Meres, MD Electrophysiologist: Sherryl Manges, MD  {Click to update primary MD,subspecialty MD or APP then REFRESH:1}    History of Present Illness:   Victoria Adams is a 82 y.o. female with h/o DM II, CKD III, obesity, hemorrhagic CVA, HTN, LBBB, permanent AF on eliquis, chronic sCHF, NICM s/p BiV ICD seen for routine electrophysiology followup.   Last remote 11/02/22 was normal with good battery status, lead measurements unchanged, & appropriate histograms per Dr. Graciela Husbands.   Last seen in EP Clinic 08/2020 by Otilio Saber, PA-C and at that time had been admitted at [facility] after a cat scratch with wound infection.   Since last being seen in our clinic the patient reports doing ***.  she denies chest pain, palpitations, dyspnea, PND, orthopnea, nausea, vomiting, dizziness, syncope, edema, weight gain, or early satiety.   Review of systems complete and found to be negative unless listed in HPI.    EP Information / Studies Reviewed:    {EKGtoday:28818}      ICD Interrogation-  reviewed in detail today,  See PACEART report.  Device History: Medtronic BiV ICD implanted 04/16/2016 for NICM, Chronic Systolic CHF  History of appropriate therapy: No History of AAD therapy:  amiodarone     ECHO 07/2020 > LV 55-60%, mild to moderate MVR, LA severely dilated, RV mildly dilated with normal systolic function, moderate TR, mild PH with estimated PAS of , RA severely dilated  Risk Assessment/Calculations:    CHA2DS2-VASc Score = 9  {Click here to calculate score.  REFRESH note before signing. :1} This indicates a 12.2% annual risk of stroke. The patient's score is based upon: CHF History: 1 HTN History: 1 Diabetes History: 1 Stroke History: 2 Vascular Disease History: 1 Age Score: 2 Gender Score: 1   {This patient has a significant risk of stroke if  diagnosed with atrial fibrillation.  Please consider VKA or DOAC agent for anticoagulation if the bleeding risk is acceptable.   You can also use the SmartPhrase .[email or web address] for documentation.   :[id number]} No BP recorded.  {Refresh Note OR Click here to enter BP  :1}***        Physical Exam:   VS:  There were no vitals taken for this visit.   Wt Readings from Last 3 Encounters:  05/09/21 214 lb 15.2 oz (97.5 kg)  01/21/21 234 lb (106.1 kg)  09/22/20 232 lb (105.2 kg)     GEN: Well nourished, well developed in no acute distress NECK: No JVD; No carotid bruits CARDIAC: {EPRHYTHM:28826}, no murmurs, rubs, gallops RESPIRATORY:  Clear to auscultation without rales, wheezing or rhonchi  ABDOMEN: Soft, non-tender, non-distended EXTREMITIES:  No edema; No deformity   ASSESSMENT AND PLAN:    Chronic Systolic Dysfunction s/p Medtronic CRT-D  NICM  LBBB -euvolemic today -Stable on an appropriate medical regimen -Normal ICD function -See Pace Art report -No changes today -continue metoprolol, torsemide, metolazone  Permanent Atrial Fibrillation Secondary Hypercoagulable State CHA2DS2Vasc 9 / 12.2% annual risk of stroke  -continue eliquis   HTN  -well controlled   CAD  Moderate diffuse mid LAD disease of 40-60%. Septal perforator branch with ostial ~90%  -asymptomatic   Disposition:   Follow up with {EPPROVIDERS:28135} {EPFOLLOW XB:28413}   Signed, Canary Brim, MSN, APRN, NP-C, AGACNP-BC [facility] - Electrophysiology  11/16/2022, 7:39 AM

## 2022-11-17 NOTE — Progress Notes (Signed)
Remote ICD transmission.   

## 2022-12-28 NOTE — Progress Notes (Signed)
Error. No show.

## 2023-02-01 ENCOUNTER — Ambulatory Visit (INDEPENDENT_AMBULATORY_CARE_PROVIDER_SITE_OTHER): Payer: Medicare Other

## 2023-02-01 DIAGNOSIS — I428 Other cardiomyopathies: Secondary | ICD-10-CM

## 2023-02-01 DIAGNOSIS — I482 Chronic atrial fibrillation, unspecified: Secondary | ICD-10-CM

## 2023-02-02 LAB — CUP PACEART REMOTE DEVICE CHECK
Battery Remaining Longevity: 18 mo
Battery Voltage: 2.89 V
Brady Statistic AP VP Percent: 0 %
Brady Statistic AP VS Percent: 0 %
Brady Statistic AS VP Percent: 0 %
Brady Statistic AS VS Percent: 0 %
Brady Statistic RA Percent Paced: 0 %
Brady Statistic RV Percent Paced: 91.95 %
Date Time Interrogation Session: 20241022072509
HighPow Impedance: 81 Ohm
Implantable Lead Connection Status: 753985
Implantable Lead Connection Status: 753985
Implantable Lead Implant Date: 20180105
Implantable Lead Implant Date: 20180105
Implantable Lead Location: 753858
Implantable Lead Location: 753860
Implantable Lead Model: 4598
Implantable Pulse Generator Implant Date: 20180105
Lead Channel Impedance Value: 1045 Ohm
Lead Channel Impedance Value: 194.634
Lead Channel Impedance Value: 203.256
Lead Channel Impedance Value: 246.667
Lead Channel Impedance Value: 254.534
Lead Channel Impedance Value: 269.483
Lead Channel Impedance Value: 380 Ohm
Lead Channel Impedance Value: 399 Ohm
Lead Channel Impedance Value: 4047 Ohm
Lead Channel Impedance Value: 437 Ohm
Lead Channel Impedance Value: 570 Ohm
Lead Channel Impedance Value: 589 Ohm
Lead Channel Impedance Value: 665 Ohm
Lead Channel Impedance Value: 703 Ohm
Lead Channel Impedance Value: 703 Ohm
Lead Channel Impedance Value: 760 Ohm
Lead Channel Impedance Value: 988 Ohm
Lead Channel Impedance Value: 988 Ohm
Lead Channel Pacing Threshold Amplitude: 0.5 V
Lead Channel Pacing Threshold Amplitude: 1.625 V
Lead Channel Pacing Threshold Pulse Width: 0.4 ms
Lead Channel Pacing Threshold Pulse Width: 0.8 ms
Lead Channel Sensing Intrinsic Amplitude: 18.375 mV
Lead Channel Sensing Intrinsic Amplitude: 18.375 mV
Lead Channel Setting Pacing Amplitude: 2 V
Lead Channel Setting Pacing Amplitude: 2.75 V
Lead Channel Setting Pacing Pulse Width: 0.4 ms
Lead Channel Setting Pacing Pulse Width: 0.8 ms
Lead Channel Setting Sensing Sensitivity: 0.3 mV
Zone Setting Status: 755011

## 2023-02-18 NOTE — Progress Notes (Signed)
Remote ICD transmission.   

## 2023-02-23 DIAGNOSIS — I25119 Atherosclerotic heart disease of native coronary artery with unspecified angina pectoris: Secondary | ICD-10-CM | POA: Diagnosis not present

## 2023-02-23 DIAGNOSIS — R1084 Generalized abdominal pain: Secondary | ICD-10-CM | POA: Diagnosis not present

## 2023-02-23 DIAGNOSIS — R4582 Worries: Secondary | ICD-10-CM | POA: Diagnosis not present

## 2023-02-23 DIAGNOSIS — Z79891 Long term (current) use of opiate analgesic: Secondary | ICD-10-CM | POA: Diagnosis not present

## 2023-02-23 DIAGNOSIS — E7849 Other hyperlipidemia: Secondary | ICD-10-CM | POA: Diagnosis not present

## 2023-02-23 DIAGNOSIS — E1342 Other specified diabetes mellitus with diabetic polyneuropathy: Secondary | ICD-10-CM | POA: Diagnosis not present

## 2023-02-23 DIAGNOSIS — I110 Hypertensive heart disease with heart failure: Secondary | ICD-10-CM | POA: Diagnosis not present

## 2023-02-23 DIAGNOSIS — E11621 Type 2 diabetes mellitus with foot ulcer: Secondary | ICD-10-CM | POA: Diagnosis not present

## 2023-02-23 DIAGNOSIS — I5032 Chronic diastolic (congestive) heart failure: Secondary | ICD-10-CM | POA: Diagnosis not present

## 2023-02-23 DIAGNOSIS — D6869 Other thrombophilia: Secondary | ICD-10-CM | POA: Diagnosis not present

## 2023-02-23 DIAGNOSIS — I482 Chronic atrial fibrillation, unspecified: Secondary | ICD-10-CM | POA: Diagnosis not present

## 2023-02-23 DIAGNOSIS — E1122 Type 2 diabetes mellitus with diabetic chronic kidney disease: Secondary | ICD-10-CM | POA: Diagnosis not present

## 2023-03-15 ENCOUNTER — Telehealth: Payer: Self-pay | Admitting: Internal Medicine

## 2023-03-15 DIAGNOSIS — R609 Edema, unspecified: Secondary | ICD-10-CM

## 2023-03-15 DIAGNOSIS — R601 Generalized edema: Secondary | ICD-10-CM

## 2023-03-15 NOTE — Telephone Encounter (Signed)
Call back to discuss patient and daughter's concerns with lasix and swelling. Daughter Gabriel Rung [facility]) states that patient's PCP Dr. Donzetta Sprung at [facility] put her on lasix (80 mg BID)  2 months ago when patient began having wheezing and leg swelling. Monique states wheezing is resolved but legs are still swollen, right worse than left. She states patient's feet were so swollen she could not wear shoes to her other daughter's funeral recently and had to wear 2 pairs of socks instead. Daughter states patient is no longer on demadex 2.5 mg or Metolazone 2.5 mg. (She was taking these medications Monday, Wednesday and Friday.)  Daughter states that even though patient has not been seen by heart failure clinic in 2 years she would like patient to be seen by Dr. Gala Romney as soon as possible. She is also requesting an appointment with Dr. Graciela Husbands as she is not sure patient's device is working correctly.   Advised dtr Monique that I would forward her concerns to Dr. Graciela Husbands and [name] but in the meantime she needs to get patient back in with PCP Dr. Donzetta Sprung who prescribed lasix. Monique verbalizes understanding and states she will wait to hear about when and if her mother can be seen at our practice.

## 2023-03-15 NOTE — Telephone Encounter (Signed)
Pt c/o medication issue:  1. Name of Medication: Lasix 80 MG  2. How are you currently taking this medication (dosage and times per day)? Twice a day  3. Are you having a reaction (difficulty breathing--STAT)? No  4. What is your medication issue? Patient's daughter is calling because the patient has been retaining fluid for 2 months now. Patient's daughter would like to know if we can drain the fluid or if there is another medication she could take. Please advise.

## 2023-03-16 NOTE — Telephone Encounter (Signed)
Pt has been scheduled to see Francis Dowse, PA-C on 04/01/2023 for device follow up.

## 2023-03-21 NOTE — Telephone Encounter (Signed)
Spoke with Luster Landsberg, DPR and advised Dr Gala Romney recommends referral to general cardiology for further evaluation.  Pt's DPR verbalizes understanding and agrees with current plan.  Referral placed.

## 2023-03-21 NOTE — Addendum Note (Signed)
Addended by: Alois Cliche on: 03/21/2023 08:37 AM   Modules accepted: Orders

## 2023-03-31 NOTE — Progress Notes (Deleted)
Cardiology Office Note:  .   Date:  03/31/2023  ID:  Victoria Adams, DOB 1941-04-02, MRN [id number] PCP: [facility]  [facility] Providers Cardiologist:  Arvilla Meres, MD Electrophysiologist:  Sherryl Manges, MD {  History of Present Illness: .   Victoria Adams is a 82 y.o. female w/PMHx of HTN, DM, Afib (permanent), CAD, NICM, chronic CHF (systolic), LBBB, chronic back pain, stroke (hemorrhagic), CAD, lymphedema   Dr. Graciela Husbands, last seen by him 2018, discussed significant PVC burden though in the environment of normalized LVEF and diminished CRT pacing to mid 90's because of that, no intervention/particular treatment for them.  Seeing myself, A. Tillery, PA-c since then  Most recent, myself back in 2022  Last saw HF team/Dr. Gala Romney 2022 as well, dizzy post stroke, difficulty getting around, family urging ALF though she didn't want to, very sedentary. Volume OL, dietary noncompliance, low BP% Meds adjusted, labs done  Lost to f/u since then  Today's visit is scheduled as an overdue visit  ROS:   B>AX.... *** volume *** symptoms *** ALF? *** eliquis, dose, bleeding, labs *** PVCs *** BP% *** needs HF team   Device information MDR CRT -D, implanted 04/16/2016   Arrhythmia/AAD hx No current AAD tx  Studies Reviewed: Marland Kitchen    EKG done today and reviewed by myself:  ***  DEVICE interrogation done today and reviewed by myself *** Battery and lead measurements are good ***   05/08/20: TTE 1. Left ventricular ejection fraction, by estimation, is 55 to 60%. The  left ventricle has normal function. The left ventricle has no regional  wall motion abnormalities. The left ventricular internal cavity size was  mildly dilated. Left ventricular  diastolic parameters are indeterminate.   2. Right ventricular systolic function is normal. The right ventricular  size is normal. There is mildly elevated pulmonary artery systolic  pressure.   3. Left  atrial size was mildly dilated.   4. The mitral valve is normal in structure. Trivial mitral valve  regurgitation. No evidence of mitral stenosis.   5. The aortic valve is tricuspid. Aortic valve regurgitation is not  visualized. Mild to moderate aortic valve sclerosis/calcification is  present, without any evidence of aortic stenosis.   6. The inferior vena cava is normal in size with greater than 50%  respiratory variability, suggesting right atrial pressure of 3 mmHg.     Risk Assessment/Calculations:    Physical Exam:   VS:  There were no vitals taken for this visit.   Wt Readings from Last 3 Encounters:  05/09/21 214 lb 15.2 oz (97.5 kg)  01/21/21 234 lb (106.1 kg)  09/22/20 232 lb (105.2 kg)    GEN: Well nourished, well developed in no acute distress NECK: No JVD; No carotid bruits CARDIAC: ***RRR, no murmurs, rubs, gallops RESPIRATORY:  *** CTA b/l without rales, wheezing or rhonchi  ABDOMEN: Soft, non-tender, non-distended EXTREMITIES:  No edema; No deformity   ICD site: *** is stable, no thinning, fluctuation, tethering  ASSESSMENT AND PLAN: .    CRT-D *** intact function *** no programming changes made  Permanent AFib CHA2DS2Vasc is 5, on Eliquis, *** appropriately dosed *** rate controlled  NICM Chronic CHF Recovered LVEF by echo 2022 *** Needs to re-establish with Dr. Lynne Logan  CAD ***  Secondary hypercoagulable state 2/2 AFib     {Are you ordering a CV Procedure (e.g. stress test, cath, DCCV, TEE, etc)?   Press F2        :[id number]}  Dispo: ***  Signed, Sheilah Pigeon, PA-C

## 2023-04-01 ENCOUNTER — Ambulatory Visit: Payer: Medicare Other | Admitting: Physician Assistant

## 2023-04-17 NOTE — Progress Notes (Deleted)
 Cardiology Office Note:  .   Date:  04/17/2023  ID:  Victoria Adams, DOB 1941-01-22, MRN [id number] PCP: [facility]  [facility] Providers Cardiologist:  Toribio Fuel, MD Electrophysiologist:  Elspeth Sage, MD {  History of Present Illness: .   Victoria Adams is a 83 y.o. female w/PMHx of HTN, DM, Afib (permanent), CAD, NICM, chronic CHF (systolic), LBBB, chronic back pain, stroke (hemorrhagic), CAD, lymphedema   Dr. Sage, last seen by him 2018, discussed significant PVC burden though in the environment of normalized LVEF and diminished CRT pacing to mid 90's because of that, no intervention/particular treatment for them.  Seeing myself, A. Tillery, PA-c since then  Most recent, myself back in 2022  Last saw HF team/Dr. Fuel 2022 as well, dizzy post stroke, difficulty getting around, family urging ALF though she didn't want to, very sedentary. Volume OL, dietary noncompliance, low BP% Meds adjusted, labs done  Lost to f/u since then  Today's visit is scheduled as an overdue visit  ROS:   B>AX.... *** volume *** symptoms *** ALF? *** eliquis , dose, bleeding, labs *** PVCs *** BP% *** needs HF team   Device information MDR CRT -D, implanted 04/16/2016   Arrhythmia/AAD hx No current AAD tx  Studies Reviewed: SABRA    EKG done today and reviewed by myself:  ***  DEVICE interrogation done today and reviewed by myself *** Battery and lead measurements are good ***   05/08/20: TTE 1. Left ventricular ejection fraction, by estimation, is 55 to 60%. The  left ventricle has normal function. The left ventricle has no regional  wall motion abnormalities. The left ventricular internal cavity size was  mildly dilated. Left ventricular  diastolic parameters are indeterminate.   2. Right ventricular systolic function is normal. The right ventricular  size is normal. There is mildly elevated pulmonary artery systolic  pressure.   3. Left atrial  size was mildly dilated.   4. The mitral valve is normal in structure. Trivial mitral valve  regurgitation. No evidence of mitral stenosis.   5. The aortic valve is tricuspid. Aortic valve regurgitation is not  visualized. Mild to moderate aortic valve sclerosis/calcification is  present, without any evidence of aortic stenosis.   6. The inferior vena cava is normal in size with greater than 50%  respiratory variability, suggesting right atrial pressure of 3 mmHg.     Risk Assessment/Calculations:    Physical Exam:   VS:  There were no vitals taken for this visit.   Wt Readings from Last 3 Encounters:  05/09/21 214 lb 15.2 oz (97.5 kg)  01/21/21 234 lb (106.1 kg)  09/22/20 232 lb (105.2 kg)    GEN: Well nourished, well developed in no acute distress NECK: No JVD; No carotid bruits CARDIAC: ***RRR, no murmurs, rubs, gallops RESPIRATORY:  *** CTA b/l without rales, wheezing or rhonchi  ABDOMEN: Soft, non-tender, non-distended EXTREMITIES:  No edema; No deformity   ICD site: *** is stable, no thinning, fluctuation, tethering  ASSESSMENT AND PLAN: .    CRT-D *** intact function *** no programming changes made  Permanent AFib CHA2DS2Vasc is 5, on Eliquis , *** appropriately dosed *** rate controlled  NICM Chronic CHF Recovered LVEF by echo 2022 *** Needs to re-establish with Dr. Boone  CAD ***  Secondary hypercoagulable state 2/2 AFib     {Are you ordering a CV Procedure (e.g. stress test, cath, DCCV, TEE, etc)?   Press F2        :[id number]}  Dispo: ***  Signed, Charlies Macario Arthur, PA-C

## 2023-04-18 ENCOUNTER — Ambulatory Visit: Payer: Medicare Other | Admitting: Physician Assistant

## 2023-04-20 DIAGNOSIS — I5032 Chronic diastolic (congestive) heart failure: Secondary | ICD-10-CM | POA: Diagnosis not present

## 2023-04-20 DIAGNOSIS — E7849 Other hyperlipidemia: Secondary | ICD-10-CM | POA: Diagnosis not present

## 2023-04-20 DIAGNOSIS — E1165 Type 2 diabetes mellitus with hyperglycemia: Secondary | ICD-10-CM | POA: Diagnosis not present

## 2023-04-20 DIAGNOSIS — I482 Chronic atrial fibrillation, unspecified: Secondary | ICD-10-CM | POA: Diagnosis not present

## 2023-04-20 DIAGNOSIS — I10 Essential (primary) hypertension: Secondary | ICD-10-CM | POA: Diagnosis not present

## 2023-04-20 DIAGNOSIS — N183 Chronic kidney disease, stage 3 unspecified: Secondary | ICD-10-CM | POA: Diagnosis not present

## 2023-04-20 DIAGNOSIS — I700 Atherosclerosis of aorta: Secondary | ICD-10-CM | POA: Diagnosis not present

## 2023-04-20 DIAGNOSIS — D649 Anemia, unspecified: Secondary | ICD-10-CM | POA: Diagnosis not present

## 2023-04-20 DIAGNOSIS — Z0001 Encounter for general adult medical examination with abnormal findings: Secondary | ICD-10-CM | POA: Diagnosis not present

## 2023-04-21 DIAGNOSIS — I5032 Chronic diastolic (congestive) heart failure: Secondary | ICD-10-CM | POA: Diagnosis not present

## 2023-04-21 DIAGNOSIS — E1122 Type 2 diabetes mellitus with diabetic chronic kidney disease: Secondary | ICD-10-CM | POA: Diagnosis not present

## 2023-04-21 DIAGNOSIS — E11621 Type 2 diabetes mellitus with foot ulcer: Secondary | ICD-10-CM | POA: Diagnosis not present

## 2023-04-21 DIAGNOSIS — I110 Hypertensive heart disease with heart failure: Secondary | ICD-10-CM | POA: Diagnosis not present

## 2023-04-21 DIAGNOSIS — R1084 Generalized abdominal pain: Secondary | ICD-10-CM | POA: Diagnosis not present

## 2023-04-21 DIAGNOSIS — R4582 Worries: Secondary | ICD-10-CM | POA: Diagnosis not present

## 2023-04-21 DIAGNOSIS — I25119 Atherosclerotic heart disease of native coronary artery with unspecified angina pectoris: Secondary | ICD-10-CM | POA: Diagnosis not present

## 2023-04-21 DIAGNOSIS — Z1329 Encounter for screening for other suspected endocrine disorder: Secondary | ICD-10-CM | POA: Diagnosis not present

## 2023-04-21 DIAGNOSIS — I482 Chronic atrial fibrillation, unspecified: Secondary | ICD-10-CM | POA: Diagnosis not present

## 2023-04-21 DIAGNOSIS — E7849 Other hyperlipidemia: Secondary | ICD-10-CM | POA: Diagnosis not present

## 2023-04-21 DIAGNOSIS — Z23 Encounter for immunization: Secondary | ICD-10-CM | POA: Diagnosis not present

## 2023-04-21 DIAGNOSIS — I10 Essential (primary) hypertension: Secondary | ICD-10-CM | POA: Diagnosis not present

## 2023-05-03 ENCOUNTER — Ambulatory Visit (INDEPENDENT_AMBULATORY_CARE_PROVIDER_SITE_OTHER): Payer: Medicare Other

## 2023-05-03 DIAGNOSIS — I428 Other cardiomyopathies: Secondary | ICD-10-CM

## 2023-05-03 DIAGNOSIS — I5022 Chronic systolic (congestive) heart failure: Secondary | ICD-10-CM

## 2023-05-03 LAB — CUP PACEART REMOTE DEVICE CHECK
Battery Remaining Longevity: 15 mo
Battery Voltage: 2.89 V
Brady Statistic AP VP Percent: 0 %
Brady Statistic AP VS Percent: 0 %
Brady Statistic AS VP Percent: 0 %
Brady Statistic AS VS Percent: 0 %
Brady Statistic RA Percent Paced: 0 %
Brady Statistic RV Percent Paced: 87.06 %
Date Time Interrogation Session: 20250121061703
HighPow Impedance: 84 Ohm
Implantable Lead Connection Status: 753985
Implantable Lead Connection Status: 753985
Implantable Lead Implant Date: 20180105
Implantable Lead Implant Date: 20180105
Implantable Lead Location: 753858
Implantable Lead Location: 753860
Implantable Lead Model: 4598
Implantable Pulse Generator Implant Date: 20180105
Lead Channel Impedance Value: 228 Ohm
Lead Channel Impedance Value: 228 Ohm
Lead Channel Impedance Value: 267.31 Ohm
Lead Channel Impedance Value: 267.31 Ohm
Lead Channel Impedance Value: 267.31 Ohm
Lead Channel Impedance Value: 4047 Ohm
Lead Channel Impedance Value: 456 Ohm
Lead Channel Impedance Value: 456 Ohm
Lead Channel Impedance Value: 456 Ohm
Lead Channel Impedance Value: 494 Ohm
Lead Channel Impedance Value: 513 Ohm
Lead Channel Impedance Value: 570 Ohm
Lead Channel Impedance Value: 646 Ohm
Lead Channel Impedance Value: 760 Ohm
Lead Channel Impedance Value: 779 Ohm
Lead Channel Impedance Value: 950 Ohm
Lead Channel Impedance Value: 969 Ohm
Lead Channel Impedance Value: 969 Ohm
Lead Channel Pacing Threshold Amplitude: 0.5 V
Lead Channel Pacing Threshold Amplitude: 1.25 V
Lead Channel Pacing Threshold Pulse Width: 0.4 ms
Lead Channel Pacing Threshold Pulse Width: 0.8 ms
Lead Channel Sensing Intrinsic Amplitude: 17.75 mV
Lead Channel Sensing Intrinsic Amplitude: 17.75 mV
Lead Channel Setting Pacing Amplitude: 1.75 V
Lead Channel Setting Pacing Amplitude: 2 V
Lead Channel Setting Pacing Pulse Width: 0.4 ms
Lead Channel Setting Pacing Pulse Width: 0.8 ms
Lead Channel Setting Sensing Sensitivity: 0.3 mV
Zone Setting Status: 755011

## 2023-05-08 NOTE — Progress Notes (Deleted)
Cardiology Office Note:  .   Date:  05/08/2023  ID:  Koleen Distance, DOB [birth year], MRN [id number] PCP: [facility]  [facility] Providers Cardiologist:  Arvilla Meres, MD Electrophysiologist:  Sherryl Manges, MD {  History of Present Illness: .   TOMMYE LEHENBAUER is a 83 y.o. female w/PMHx of HTN, DM, Afib (permanent), CAD, NICM, chronic CHF (systolic), LBBB, chronic back pain, stroke (hemorrhagic), CAD, lymphedema   Dr. Graciela Husbands, last seen by him 2018, discussed significant PVC burden though in the environment of normalized LVEF and diminished CRT pacing to mid 90's because of that, no intervention/particular treatment for them.  Seeing myself, A. Tillery, PA-c since then  Most recent, myself back in 2022  Last saw HF team/Dr. Gala Romney 2022 as well, dizzy post stroke, difficulty getting around, family urging ALF though she didn't want to, very sedentary. Volume OL, dietary noncompliance, low BP% Meds adjusted, labs done  No showed to AT appointment 11/16/22 Rescheduled several visits  Lost to f/u since then  Today's visit is scheduled as an overdue visit  ROS:   *** B>AX.... *** volume *** symptoms *** ALF? *** eliquis, dose, bleeding, labs *** PVCs *** BP% *** needs HF team   Device information MDR CRT -D, implanted 04/16/2016   Arrhythmia/AAD hx No current AAD tx  Studies Reviewed: Marland Kitchen    EKG done today and reviewed by myself:  ***  DEVICE interrogation done today and reviewed by myself *** Battery and lead measurements are good ***   05/08/20: TTE 1. Left ventricular ejection fraction, by estimation, is 55 to 60%. The  left ventricle has normal function. The left ventricle has no regional  wall motion abnormalities. The left ventricular internal cavity size was  mildly dilated. Left ventricular  diastolic parameters are indeterminate.   2. Right ventricular systolic function is normal. The right ventricular  size is normal. There is  mildly elevated pulmonary artery systolic  pressure.   3. Left atrial size was mildly dilated.   4. The mitral valve is normal in structure. Trivial mitral valve  regurgitation. No evidence of mitral stenosis.   5. The aortic valve is tricuspid. Aortic valve regurgitation is not  visualized. Mild to moderate aortic valve sclerosis/calcification is  present, without any evidence of aortic stenosis.   6. The inferior vena cava is normal in size with greater than 50%  respiratory variability, suggesting right atrial pressure of 3 mmHg.     Risk Assessment/Calculations:    Physical Exam:   VS:  There were no vitals taken for this visit.   Wt Readings from Last 3 Encounters:  05/09/21 214 lb 15.2 oz (97.5 kg)  01/21/21 234 lb (106.1 kg)  09/22/20 232 lb (105.2 kg)    GEN: Well nourished, well developed in no acute distress NECK: No JVD; No carotid bruits CARDIAC: ***RRR, no murmurs, rubs, gallops RESPIRATORY:  *** CTA b/l without rales, wheezing or rhonchi  ABDOMEN: Soft, non-tender, non-distended EXTREMITIES:  No edema; No deformity   ICD site: *** is stable, no thinning, fluctuation, tethering  ASSESSMENT AND PLAN: .    CRT-D *** intact function *** no programming changes made  Permanent AFib CHA2DS2Vasc is 5, on Eliquis, *** appropriately dosed *** rate controlled  NICM Chronic CHF Recovered LVEF by echo 2022 *** Needs to re-establish with Dr. Lynne Logan  CAD ***  Secondary hypercoagulable state 2/2 AFib     {Are you ordering a CV Procedure (e.g. stress test, cath, DCCV, TEE, etc)?  Press F2        :[id number]}     Dispo: ***  Signed, Sheilah Pigeon, PA-C

## 2023-05-11 ENCOUNTER — Ambulatory Visit: Payer: Medicare Other | Admitting: Physician Assistant

## 2023-05-12 ENCOUNTER — Telehealth: Payer: Self-pay

## 2023-05-12 NOTE — Telephone Encounter (Signed)
I have attempted all numbers on file with no success. Routing to [employer]. I am happy to send a certified letter if you would like?

## 2023-05-12 NOTE — Telephone Encounter (Signed)
-----   Message from Sheilah Pigeon sent at 05/11/2023 10:23 AM EST ----- Last time this patient was actually seen was in 2022 She is doing remotes, we know she needs programming done (B>AX) She has cancelled or no-showed 5 visits I suspect this is social/transportation...maybe not, I don't know  She is scheduled to see APP Philis Nettle in the [place] office... Wondering of she maybe can be called to see if this office is better/easier Change to our [facility] EP provider, get a device RN to get her programmed appropriately there at least...     ??? Dr. Graciela Husbands thoughts?  renee

## 2023-05-16 NOTE — Progress Notes (Signed)
 Certified letter sent

## 2023-05-16 NOTE — Telephone Encounter (Signed)
 Certified letter sent

## 2023-05-24 ENCOUNTER — Ambulatory Visit: Payer: Medicare Other | Attending: Nurse Practitioner | Admitting: Nurse Practitioner

## 2023-05-24 NOTE — Progress Notes (Deleted)
  Cardiology Office Note:  .   Date:  05/24/2023  ID:  Victoria Adams, DOB 1941/02/03, MRN [id number] PCP: [facility]  [facility] Providers Cardiologist:  Arvilla Meres, MD Electrophysiologist:  Sherryl Manges, MD { Click to update primary MD,subspecialty MD or APP then REFRESH:1}   History of Present Illness: .   Victoria Adams is a 83 y.o. female with a PMH of permanent A-fib, hypertension, chronic systolic CHF/NICM, CAD, type 2 diabetes, left bundle branch block, history of hemorrhagic stroke, chronic back pain, LBBB, and lymphedema, who presents today for swelling evaluation.  She has been closely followed by electrophysiology and heart failure clinic.  Admitted to [facility] in April 2022 for heart failure exacerbation, had right foot wound after cat scratch.  Treated with IV diuretics and antibiotics.  Did undergo surgical I&D.  Medications were adjusted during that hospital stay.  TTE revealed EF 55 to 60%, mild RV dilatation, LA severely dilated, PA 45 mmHg, moderate TR, RA severely dilated.    Last seen by Jacolyn Reedy, PA-C on July 31, 2020 for hospital follow-up.  Patient at that time was feeling tired and weak.  Did have a fall the day before office visit, denied any syncope or acute injury.  Was being followed by home health and wound clinic. Found to be hypotensive in office, losartan was stopped and aspirin added. Instructed to follow-up with HF clinic.   Last seen by Maxine Glenn, PA-C on Aug 22, 2020.  Was noted she has seen heart failure clinic day before and her diuretics were adjusted.  Was told to follow-up in 2 months.  Today she presents for overdue follow-up.  She states...  ROS: ***  Studies Reviewed: .        *** Risk Assessment/Calculations:   {Does this patient have ATRIAL FIBRILLATION?[phone]} No BP recorded.  {Refresh Note OR Click here to enter BP  :1}***       Physical Exam:   VS:  There were no vitals taken for this visit.    Wt Readings from Last 3 Encounters:  05/09/21 214 lb 15.2 oz (97.5 kg)  01/21/21 234 lb (106.1 kg)  09/22/20 232 lb (105.2 kg)    GEN: Well nourished, well developed in no acute distress NECK: No JVD; No carotid bruits CARDIAC: ***RRR, no murmurs, rubs, gallops RESPIRATORY:  Clear to auscultation without rales, wheezing or rhonchi  ABDOMEN: Soft, non-tender, non-distended EXTREMITIES:  No edema; No deformity   ASSESSMENT AND PLAN: .   ***    {Are you ordering a CV Procedure (e.g. stress test, cath, DCCV, TEE, etc)?   Press F2        :[id number]}  Dispo: ***  Signed, Sharlene Dory, NP

## 2023-06-13 NOTE — Progress Notes (Signed)
 Remote ICD transmission.

## 2023-08-02 ENCOUNTER — Ambulatory Visit

## 2023-08-02 DIAGNOSIS — I428 Other cardiomyopathies: Secondary | ICD-10-CM

## 2023-08-03 LAB — CUP PACEART REMOTE DEVICE CHECK
Battery Remaining Longevity: 13 mo
Battery Voltage: 2.88 V
Brady Statistic AP VP Percent: 0 %
Brady Statistic AP VS Percent: 0 %
Brady Statistic AS VP Percent: 0 %
Brady Statistic AS VS Percent: 0 %
Brady Statistic RA Percent Paced: 0 %
Brady Statistic RV Percent Paced: 86.76 %
Date Time Interrogation Session: 20250422052724
HighPow Impedance: 88 Ohm
Implantable Lead Connection Status: 753985
Implantable Lead Connection Status: 753985
Implantable Lead Implant Date: 20180105
Implantable Lead Implant Date: 20180105
Implantable Lead Location: 753858
Implantable Lead Location: 753860
Implantable Lead Model: 4598
Implantable Pulse Generator Implant Date: 20180105
Lead Channel Impedance Value: 1045 Ohm
Lead Channel Impedance Value: 1045 Ohm
Lead Channel Impedance Value: 1083 Ohm
Lead Channel Impedance Value: 237.12 Ohm
Lead Channel Impedance Value: 247 Ohm
Lead Channel Impedance Value: 270.508
Lead Channel Impedance Value: 283.443
Lead Channel Impedance Value: 283.443
Lead Channel Impedance Value: 4047 Ohm
Lead Channel Impedance Value: 456 Ohm
Lead Channel Impedance Value: 494 Ohm
Lead Channel Impedance Value: 494 Ohm
Lead Channel Impedance Value: 589 Ohm
Lead Channel Impedance Value: 589 Ohm
Lead Channel Impedance Value: 627 Ohm
Lead Channel Impedance Value: 665 Ohm
Lead Channel Impedance Value: 836 Ohm
Lead Channel Impedance Value: 836 Ohm
Lead Channel Pacing Threshold Amplitude: 0.5 V
Lead Channel Pacing Threshold Amplitude: 1.625 V
Lead Channel Pacing Threshold Pulse Width: 0.4 ms
Lead Channel Pacing Threshold Pulse Width: 0.8 ms
Lead Channel Sensing Intrinsic Amplitude: 19.125 mV
Lead Channel Sensing Intrinsic Amplitude: 19.125 mV
Lead Channel Setting Pacing Amplitude: 2 V
Lead Channel Setting Pacing Amplitude: 2.25 V
Lead Channel Setting Pacing Pulse Width: 0.4 ms
Lead Channel Setting Pacing Pulse Width: 0.8 ms
Lead Channel Setting Sensing Sensitivity: 0.3 mV
Zone Setting Status: 755011

## 2023-08-30 DIAGNOSIS — E1122 Type 2 diabetes mellitus with diabetic chronic kidney disease: Secondary | ICD-10-CM | POA: Diagnosis not present

## 2023-08-30 DIAGNOSIS — N184 Chronic kidney disease, stage 4 (severe): Secondary | ICD-10-CM | POA: Diagnosis not present

## 2023-08-30 DIAGNOSIS — I252 Old myocardial infarction: Secondary | ICD-10-CM | POA: Diagnosis not present

## 2023-08-30 DIAGNOSIS — N189 Chronic kidney disease, unspecified: Secondary | ICD-10-CM | POA: Diagnosis not present

## 2023-08-30 DIAGNOSIS — Z794 Long term (current) use of insulin: Secondary | ICD-10-CM | POA: Diagnosis not present

## 2023-08-30 DIAGNOSIS — I482 Chronic atrial fibrillation, unspecified: Secondary | ICD-10-CM | POA: Diagnosis not present

## 2023-08-30 DIAGNOSIS — E1142 Type 2 diabetes mellitus with diabetic polyneuropathy: Secondary | ICD-10-CM | POA: Diagnosis not present

## 2023-08-30 DIAGNOSIS — M25552 Pain in left hip: Secondary | ICD-10-CM | POA: Diagnosis not present

## 2023-08-30 DIAGNOSIS — Z885 Allergy status to narcotic agent status: Secondary | ICD-10-CM | POA: Diagnosis not present

## 2023-08-30 DIAGNOSIS — Z7901 Long term (current) use of anticoagulants: Secondary | ICD-10-CM | POA: Diagnosis not present

## 2023-08-30 DIAGNOSIS — L0390 Cellulitis, unspecified: Secondary | ICD-10-CM | POA: Diagnosis not present

## 2023-08-30 DIAGNOSIS — I3481 Nonrheumatic mitral (valve) annulus calcification: Secondary | ICD-10-CM | POA: Diagnosis not present

## 2023-08-30 DIAGNOSIS — M1712 Unilateral primary osteoarthritis, left knee: Secondary | ICD-10-CM | POA: Diagnosis not present

## 2023-08-30 DIAGNOSIS — I081 Rheumatic disorders of both mitral and tricuspid valves: Secondary | ICD-10-CM | POA: Diagnosis not present

## 2023-08-30 DIAGNOSIS — I5022 Chronic systolic (congestive) heart failure: Secondary | ICD-10-CM | POA: Diagnosis not present

## 2023-08-30 DIAGNOSIS — L03116 Cellulitis of left lower limb: Secondary | ICD-10-CM | POA: Diagnosis not present

## 2023-08-30 DIAGNOSIS — Z743 Need for continuous supervision: Secondary | ICD-10-CM | POA: Diagnosis not present

## 2023-08-30 DIAGNOSIS — R0902 Hypoxemia: Secondary | ICD-10-CM | POA: Diagnosis not present

## 2023-08-30 DIAGNOSIS — I272 Pulmonary hypertension, unspecified: Secondary | ICD-10-CM | POA: Diagnosis not present

## 2023-08-30 DIAGNOSIS — Z90710 Acquired absence of both cervix and uterus: Secondary | ICD-10-CM | POA: Diagnosis not present

## 2023-08-30 DIAGNOSIS — I5023 Acute on chronic systolic (congestive) heart failure: Secondary | ICD-10-CM | POA: Diagnosis present

## 2023-08-30 DIAGNOSIS — I5020 Unspecified systolic (congestive) heart failure: Secondary | ICD-10-CM | POA: Diagnosis not present

## 2023-08-30 DIAGNOSIS — Z8673 Personal history of transient ischemic attack (TIA), and cerebral infarction without residual deficits: Secondary | ICD-10-CM | POA: Diagnosis not present

## 2023-08-30 DIAGNOSIS — R21 Rash and other nonspecific skin eruption: Secondary | ICD-10-CM | POA: Diagnosis not present

## 2023-08-30 DIAGNOSIS — I4891 Unspecified atrial fibrillation: Secondary | ICD-10-CM | POA: Diagnosis not present

## 2023-08-30 DIAGNOSIS — R0600 Dyspnea, unspecified: Secondary | ICD-10-CM | POA: Diagnosis not present

## 2023-08-30 DIAGNOSIS — M549 Dorsalgia, unspecified: Secondary | ICD-10-CM | POA: Diagnosis not present

## 2023-08-30 DIAGNOSIS — N179 Acute kidney failure, unspecified: Secondary | ICD-10-CM | POA: Diagnosis not present

## 2023-08-30 DIAGNOSIS — E782 Mixed hyperlipidemia: Secondary | ICD-10-CM | POA: Diagnosis not present

## 2023-08-30 DIAGNOSIS — I130 Hypertensive heart and chronic kidney disease with heart failure and stage 1 through stage 4 chronic kidney disease, or unspecified chronic kidney disease: Secondary | ICD-10-CM | POA: Diagnosis not present

## 2023-08-30 DIAGNOSIS — Z95810 Presence of automatic (implantable) cardiac defibrillator: Secondary | ICD-10-CM | POA: Diagnosis not present

## 2023-08-30 DIAGNOSIS — I890 Lymphedema, not elsewhere classified: Secondary | ICD-10-CM | POA: Diagnosis not present

## 2023-08-30 DIAGNOSIS — E876 Hypokalemia: Secondary | ICD-10-CM | POA: Diagnosis not present

## 2023-08-30 DIAGNOSIS — L03115 Cellulitis of right lower limb: Secondary | ICD-10-CM | POA: Diagnosis not present

## 2023-08-30 DIAGNOSIS — I10 Essential (primary) hypertension: Secondary | ICD-10-CM | POA: Diagnosis not present

## 2023-08-30 DIAGNOSIS — F331 Major depressive disorder, recurrent, moderate: Secondary | ICD-10-CM | POA: Diagnosis not present

## 2023-08-30 DIAGNOSIS — I2510 Atherosclerotic heart disease of native coronary artery without angina pectoris: Secondary | ICD-10-CM | POA: Diagnosis not present

## 2023-08-30 DIAGNOSIS — Z6841 Body Mass Index (BMI) 40.0 and over, adult: Secondary | ICD-10-CM | POA: Diagnosis not present

## 2023-08-30 DIAGNOSIS — I480 Paroxysmal atrial fibrillation: Secondary | ICD-10-CM | POA: Diagnosis not present

## 2023-08-30 DIAGNOSIS — I428 Other cardiomyopathies: Secondary | ICD-10-CM | POA: Diagnosis not present

## 2023-08-30 DIAGNOSIS — M7989 Other specified soft tissue disorders: Secondary | ICD-10-CM | POA: Diagnosis not present

## 2023-08-30 DIAGNOSIS — R54 Age-related physical debility: Secondary | ICD-10-CM | POA: Diagnosis not present

## 2023-08-30 DIAGNOSIS — R609 Edema, unspecified: Secondary | ICD-10-CM | POA: Diagnosis not present

## 2023-08-30 DIAGNOSIS — M25562 Pain in left knee: Secondary | ICD-10-CM | POA: Diagnosis not present

## 2023-08-30 DIAGNOSIS — E11649 Type 2 diabetes mellitus with hypoglycemia without coma: Secondary | ICD-10-CM | POA: Diagnosis not present

## 2023-08-31 DIAGNOSIS — N189 Chronic kidney disease, unspecified: Secondary | ICD-10-CM | POA: Diagnosis not present

## 2023-08-31 DIAGNOSIS — I081 Rheumatic disorders of both mitral and tricuspid valves: Secondary | ICD-10-CM | POA: Diagnosis not present

## 2023-08-31 DIAGNOSIS — I272 Pulmonary hypertension, unspecified: Secondary | ICD-10-CM | POA: Diagnosis not present

## 2023-08-31 DIAGNOSIS — I4891 Unspecified atrial fibrillation: Secondary | ICD-10-CM | POA: Diagnosis not present

## 2023-08-31 DIAGNOSIS — N179 Acute kidney failure, unspecified: Secondary | ICD-10-CM | POA: Diagnosis not present

## 2023-08-31 DIAGNOSIS — I428 Other cardiomyopathies: Secondary | ICD-10-CM | POA: Diagnosis not present

## 2023-08-31 DIAGNOSIS — E1122 Type 2 diabetes mellitus with diabetic chronic kidney disease: Secondary | ICD-10-CM | POA: Diagnosis not present

## 2023-08-31 DIAGNOSIS — I5023 Acute on chronic systolic (congestive) heart failure: Secondary | ICD-10-CM | POA: Diagnosis not present

## 2023-08-31 DIAGNOSIS — I5022 Chronic systolic (congestive) heart failure: Secondary | ICD-10-CM | POA: Diagnosis not present

## 2023-08-31 DIAGNOSIS — E876 Hypokalemia: Secondary | ICD-10-CM | POA: Diagnosis not present

## 2023-08-31 DIAGNOSIS — I2510 Atherosclerotic heart disease of native coronary artery without angina pectoris: Secondary | ICD-10-CM | POA: Diagnosis not present

## 2023-09-01 DIAGNOSIS — I428 Other cardiomyopathies: Secondary | ICD-10-CM | POA: Diagnosis not present

## 2023-09-01 DIAGNOSIS — I5022 Chronic systolic (congestive) heart failure: Secondary | ICD-10-CM | POA: Diagnosis not present

## 2023-09-01 DIAGNOSIS — E876 Hypokalemia: Secondary | ICD-10-CM | POA: Diagnosis not present

## 2023-09-01 DIAGNOSIS — I2510 Atherosclerotic heart disease of native coronary artery without angina pectoris: Secondary | ICD-10-CM | POA: Diagnosis not present

## 2023-09-01 DIAGNOSIS — R54 Age-related physical debility: Secondary | ICD-10-CM | POA: Diagnosis not present

## 2023-09-01 DIAGNOSIS — N189 Chronic kidney disease, unspecified: Secondary | ICD-10-CM | POA: Diagnosis not present

## 2023-09-01 DIAGNOSIS — N179 Acute kidney failure, unspecified: Secondary | ICD-10-CM | POA: Diagnosis not present

## 2023-09-01 DIAGNOSIS — I4891 Unspecified atrial fibrillation: Secondary | ICD-10-CM | POA: Diagnosis not present

## 2023-09-02 DIAGNOSIS — N189 Chronic kidney disease, unspecified: Secondary | ICD-10-CM | POA: Diagnosis not present

## 2023-09-02 DIAGNOSIS — I4891 Unspecified atrial fibrillation: Secondary | ICD-10-CM | POA: Diagnosis not present

## 2023-09-02 DIAGNOSIS — N179 Acute kidney failure, unspecified: Secondary | ICD-10-CM | POA: Diagnosis not present

## 2023-09-02 DIAGNOSIS — R54 Age-related physical debility: Secondary | ICD-10-CM | POA: Diagnosis not present

## 2023-09-02 DIAGNOSIS — I428 Other cardiomyopathies: Secondary | ICD-10-CM | POA: Diagnosis not present

## 2023-09-02 DIAGNOSIS — I5022 Chronic systolic (congestive) heart failure: Secondary | ICD-10-CM | POA: Diagnosis not present

## 2023-09-03 DIAGNOSIS — M25552 Pain in left hip: Secondary | ICD-10-CM | POA: Diagnosis not present

## 2023-09-03 DIAGNOSIS — I4891 Unspecified atrial fibrillation: Secondary | ICD-10-CM | POA: Diagnosis not present

## 2023-09-03 DIAGNOSIS — M1712 Unilateral primary osteoarthritis, left knee: Secondary | ICD-10-CM | POA: Diagnosis not present

## 2023-09-03 DIAGNOSIS — M25562 Pain in left knee: Secondary | ICD-10-CM | POA: Diagnosis not present

## 2023-09-03 DIAGNOSIS — Z743 Need for continuous supervision: Secondary | ICD-10-CM | POA: Diagnosis not present

## 2023-09-03 DIAGNOSIS — M549 Dorsalgia, unspecified: Secondary | ICD-10-CM | POA: Diagnosis not present

## 2023-09-03 DIAGNOSIS — N179 Acute kidney failure, unspecified: Secondary | ICD-10-CM | POA: Diagnosis not present

## 2023-09-03 DIAGNOSIS — R54 Age-related physical debility: Secondary | ICD-10-CM | POA: Diagnosis not present

## 2023-09-03 DIAGNOSIS — R0902 Hypoxemia: Secondary | ICD-10-CM | POA: Diagnosis not present

## 2023-09-07 ENCOUNTER — Emergency Department (HOSPITAL_COMMUNITY)

## 2023-09-07 ENCOUNTER — Emergency Department (HOSPITAL_COMMUNITY)
Admission: EM | Admit: 2023-09-07 | Discharge: 2023-09-09 | Disposition: A | Discharge status: Home / Self Care | Attending: Emergency Medicine | Admitting: Emergency Medicine

## 2023-09-07 ENCOUNTER — Encounter (HOSPITAL_COMMUNITY): Payer: Self-pay

## 2023-09-07 ENCOUNTER — Other Ambulatory Visit: Payer: Self-pay

## 2023-09-07 DIAGNOSIS — I5032 Chronic diastolic (congestive) heart failure: Secondary | ICD-10-CM | POA: Diagnosis not present

## 2023-09-07 DIAGNOSIS — E1142 Type 2 diabetes mellitus with diabetic polyneuropathy: Secondary | ICD-10-CM | POA: Diagnosis not present

## 2023-09-07 DIAGNOSIS — F331 Major depressive disorder, recurrent, moderate: Secondary | ICD-10-CM | POA: Diagnosis not present

## 2023-09-07 DIAGNOSIS — R2689 Other abnormalities of gait and mobility: Secondary | ICD-10-CM | POA: Insufficient documentation

## 2023-09-07 DIAGNOSIS — M79605 Pain in left leg: Secondary | ICD-10-CM | POA: Diagnosis not present

## 2023-09-07 DIAGNOSIS — N184 Chronic kidney disease, stage 4 (severe): Secondary | ICD-10-CM | POA: Diagnosis not present

## 2023-09-07 DIAGNOSIS — R000 Tachycardia, unspecified: Secondary | ICD-10-CM | POA: Diagnosis not present

## 2023-09-07 DIAGNOSIS — R0989 Other specified symptoms and signs involving the circulatory and respiratory systems: Secondary | ICD-10-CM | POA: Diagnosis not present

## 2023-09-07 DIAGNOSIS — Z7984 Long term (current) use of oral hypoglycemic drugs: Secondary | ICD-10-CM | POA: Diagnosis not present

## 2023-09-07 DIAGNOSIS — I4891 Unspecified atrial fibrillation: Secondary | ICD-10-CM | POA: Diagnosis not present

## 2023-09-07 DIAGNOSIS — Z794 Long term (current) use of insulin: Secondary | ICD-10-CM | POA: Diagnosis not present

## 2023-09-07 DIAGNOSIS — R0902 Hypoxemia: Secondary | ICD-10-CM | POA: Diagnosis not present

## 2023-09-07 DIAGNOSIS — I700 Atherosclerosis of aorta: Secondary | ICD-10-CM | POA: Diagnosis not present

## 2023-09-07 DIAGNOSIS — Z6841 Body Mass Index (BMI) 40.0 and over, adult: Secondary | ICD-10-CM | POA: Diagnosis not present

## 2023-09-07 DIAGNOSIS — I130 Hypertensive heart and chronic kidney disease with heart failure and stage 1 through stage 4 chronic kidney disease, or unspecified chronic kidney disease: Secondary | ICD-10-CM | POA: Diagnosis not present

## 2023-09-07 DIAGNOSIS — E876 Hypokalemia: Secondary | ICD-10-CM | POA: Diagnosis not present

## 2023-09-07 DIAGNOSIS — E785 Hyperlipidemia, unspecified: Secondary | ICD-10-CM | POA: Diagnosis not present

## 2023-09-07 DIAGNOSIS — Z9981 Dependence on supplemental oxygen: Secondary | ICD-10-CM | POA: Diagnosis not present

## 2023-09-07 DIAGNOSIS — Z8673 Personal history of transient ischemic attack (TIA), and cerebral infarction without residual deficits: Secondary | ICD-10-CM | POA: Diagnosis not present

## 2023-09-07 DIAGNOSIS — G9389 Other specified disorders of brain: Secondary | ICD-10-CM | POA: Diagnosis not present

## 2023-09-07 DIAGNOSIS — R443 Hallucinations, unspecified: Secondary | ICD-10-CM | POA: Diagnosis not present

## 2023-09-07 DIAGNOSIS — L03115 Cellulitis of right lower limb: Secondary | ICD-10-CM | POA: Diagnosis not present

## 2023-09-07 DIAGNOSIS — R410 Disorientation, unspecified: Secondary | ICD-10-CM | POA: Diagnosis not present

## 2023-09-07 DIAGNOSIS — I272 Pulmonary hypertension, unspecified: Secondary | ICD-10-CM | POA: Diagnosis not present

## 2023-09-07 DIAGNOSIS — R600 Localized edema: Secondary | ICD-10-CM | POA: Insufficient documentation

## 2023-09-07 DIAGNOSIS — I890 Lymphedema, not elsewhere classified: Secondary | ICD-10-CM | POA: Diagnosis not present

## 2023-09-07 DIAGNOSIS — I6782 Cerebral ischemia: Secondary | ICD-10-CM | POA: Diagnosis not present

## 2023-09-07 DIAGNOSIS — M109 Gout, unspecified: Secondary | ICD-10-CM | POA: Diagnosis not present

## 2023-09-07 DIAGNOSIS — Z950 Presence of cardiac pacemaker: Secondary | ICD-10-CM | POA: Diagnosis not present

## 2023-09-07 DIAGNOSIS — K219 Gastro-esophageal reflux disease without esophagitis: Secondary | ICD-10-CM | POA: Diagnosis not present

## 2023-09-07 DIAGNOSIS — M7989 Other specified soft tissue disorders: Secondary | ICD-10-CM | POA: Diagnosis not present

## 2023-09-07 DIAGNOSIS — I672 Cerebral atherosclerosis: Secondary | ICD-10-CM | POA: Diagnosis not present

## 2023-09-07 DIAGNOSIS — R4182 Altered mental status, unspecified: Secondary | ICD-10-CM | POA: Insufficient documentation

## 2023-09-07 DIAGNOSIS — M79604 Pain in right leg: Secondary | ICD-10-CM | POA: Diagnosis not present

## 2023-09-07 DIAGNOSIS — I5023 Acute on chronic systolic (congestive) heart failure: Secondary | ICD-10-CM | POA: Diagnosis not present

## 2023-09-07 DIAGNOSIS — R262 Difficulty in walking, not elsewhere classified: Secondary | ICD-10-CM

## 2023-09-07 DIAGNOSIS — I509 Heart failure, unspecified: Secondary | ICD-10-CM | POA: Insufficient documentation

## 2023-09-07 DIAGNOSIS — R918 Other nonspecific abnormal finding of lung field: Secondary | ICD-10-CM | POA: Diagnosis not present

## 2023-09-07 DIAGNOSIS — I482 Chronic atrial fibrillation, unspecified: Secondary | ICD-10-CM | POA: Diagnosis not present

## 2023-09-07 DIAGNOSIS — E440 Moderate protein-calorie malnutrition: Secondary | ICD-10-CM | POA: Diagnosis not present

## 2023-09-07 DIAGNOSIS — E1122 Type 2 diabetes mellitus with diabetic chronic kidney disease: Secondary | ICD-10-CM | POA: Diagnosis not present

## 2023-09-07 DIAGNOSIS — I2510 Atherosclerotic heart disease of native coronary artery without angina pectoris: Secondary | ICD-10-CM | POA: Diagnosis not present

## 2023-09-07 DIAGNOSIS — Z7901 Long term (current) use of anticoagulants: Secondary | ICD-10-CM | POA: Diagnosis not present

## 2023-09-07 DIAGNOSIS — I517 Cardiomegaly: Secondary | ICD-10-CM | POA: Diagnosis not present

## 2023-09-07 DIAGNOSIS — N179 Acute kidney failure, unspecified: Secondary | ICD-10-CM | POA: Diagnosis not present

## 2023-09-07 DIAGNOSIS — R609 Edema, unspecified: Secondary | ICD-10-CM | POA: Diagnosis not present

## 2023-09-07 DIAGNOSIS — M79606 Pain in leg, unspecified: Secondary | ICD-10-CM | POA: Diagnosis not present

## 2023-09-07 DIAGNOSIS — R531 Weakness: Secondary | ICD-10-CM | POA: Diagnosis not present

## 2023-09-07 LAB — COMPREHENSIVE METABOLIC PANEL WITH GFR
ALT: 23 U/L (ref 0–44)
AST: 25 U/L (ref 15–41)
Albumin: 2.4 g/dL — ABNORMAL LOW (ref 3.5–5.0)
Alkaline Phosphatase: 144 U/L — ABNORMAL HIGH (ref 38–126)
Anion gap: 12 (ref 5–15)
BUN: 74 mg/dL — ABNORMAL HIGH (ref 8–23)
CO2: 28 mmol/L (ref 22–32)
Calcium: 8.7 mg/dL — ABNORMAL LOW (ref 8.9–10.3)
Chloride: 92 mmol/L — ABNORMAL LOW (ref 98–111)
Creatinine, Ser: 2.02 mg/dL — ABNORMAL HIGH (ref 0.44–1.00)
GFR, Estimated: 24 mL/min — ABNORMAL LOW (ref >60)
Glucose, Bld: 143 mg/dL — ABNORMAL HIGH (ref 70–99)
Potassium: 4.8 mmol/L (ref 3.5–5.1)
Sodium: 132 mmol/L — ABNORMAL LOW (ref 135–145)
Total Bilirubin: 1 mg/dL (ref 0.0–1.2)
Total Protein: 7 g/dL (ref 6.5–8.1)

## 2023-09-07 LAB — URINALYSIS, ROUTINE W REFLEX MICROSCOPIC
Bilirubin Urine: NEGATIVE
Glucose, UA: NEGATIVE mg/dL
Hgb urine dipstick: NEGATIVE
Ketones, ur: NEGATIVE mg/dL
Leukocytes,Ua: NEGATIVE
Nitrite: NEGATIVE
Protein, ur: NEGATIVE mg/dL
Specific Gravity, Urine: 1.009 (ref 1.005–1.030)
pH: 6 (ref 5.0–8.0)

## 2023-09-07 LAB — CBC
HCT: 37.8 % (ref 36.0–46.0)
Hemoglobin: 11.9 g/dL — ABNORMAL LOW (ref 12.0–15.0)
MCH: 30.3 pg (ref 26.0–34.0)
MCHC: 31.5 g/dL (ref 30.0–36.0)
MCV: 96.2 fL (ref 80.0–100.0)
Platelets: 205 10*3/uL (ref 150–400)
RBC: 3.93 MIL/uL (ref 3.87–5.11)
RDW: 15.2 % (ref 11.5–15.5)
WBC: 8.8 10*3/uL (ref 4.0–10.5)
nRBC: 0 % (ref 0.0–0.2)

## 2023-09-07 LAB — BRAIN NATRIURETIC PEPTIDE: B Natriuretic Peptide: 367 pg/mL — ABNORMAL HIGH (ref 0.0–100.0)

## 2023-09-07 MED ORDER — FUROSEMIDE 10 MG/ML IJ SOLN
80.0000 mg | INTRAMUSCULAR | Status: DC
  Filled 2023-09-07: qty 8

## 2023-09-07 MED ORDER — FUROSEMIDE 40 MG PO TABS
80.0000 mg | ORAL_TABLET | ORAL | Status: AC
  Administered 2023-09-07: 80 mg via ORAL
  Filled 2023-09-07: qty 2

## 2023-09-07 NOTE — ED Triage Notes (Signed)
 Per EMS:  Swelling/Pain Legs Dc from [facility] 4 days ago for gout AMS per family A/Ox4 with EMS Started last night "Talking like people were not here"

## 2023-09-07 NOTE — ED Provider Notes (Signed)
 [place] [facility] AT [facility] Provider Note   CSN: [id number] Arrival date & time: 09/07/23  1446     History  Chief Complaint  Patient presents with   Leg Swelling   Leg Pain   Altered Mental Status    Victoria Adams is a 83 y.o. female.  83 year old female history of CHF on 3 L nasal cannula, atrial fibrillation on Eliquis , neuropathy, and diabetes who presents to the emergency department with leg pain and hallucinations.  History obtained per patient's family.  They report that she was recently hospitalized and discharged on 09/03/2023 for heart failure exacerbation as well as cellulitis of her right lower extremity.  Discharged home on Doxy.  Reports that her cellulitis has improved.  Leg swelling is also improved somewhat.  Since being discharged home has essentially been bedbound at her family's house.  Previously was walking with a walker.  Also reportedly is hallucinating and will see people who are not present in the room.  Says that she has been having left lower extremity pain across the entire extremity.       Home Medications Prior to Admission medications   Medication Sig Start Date End Date Taking? Authorizing Provider  acetaminophen  (TYLENOL ) 325 MG tablet Take 650 mg by mouth every 6 (six) hours as needed for mild pain.    [provider]  albuterol  (VENTOLIN  HFA) 108 (90 Base) MCG/ACT inhaler Inhale 2 puffs into the lungs every 6 (six) hours as needed for wheezing or shortness of breath. 05/09/21   Colin Dawley, MD  apixaban  (ELIQUIS ) 5 MG TABS tablet Take 1 tablet (5 mg total) by mouth 2 (two) times daily. 05/09/21   Colin Dawley, MD  ascorbic acid  (VITAMIN C) 500 MG tablet Take 1 tablet (500 mg total) by mouth daily. 05/10/21   Colin Dawley, MD  atorvastatin  (LIPITOR ) 80 MG tablet Take 80 mg by mouth daily. 11/05/20   [provider]  busPIRone (BUSPAR) 7.5 MG tablet Take 7.5 mg by mouth 2 (two) times daily.  04/22/21   [provider]  colchicine  0.6 MG tablet Take 0.6 mg by mouth daily. 06/30/23   [provider]  dextromethorphan -guaiFENesin  (MUCINEX  DM) 30-600 MG 12hr tablet Take 1 tablet by mouth 2 (two) times daily. 05/09/21   Colin Dawley, MD  doxycycline  (VIBRA -TABS) 100 MG tablet Take 100 mg by mouth 2 (two) times daily. 09/03/23   [provider]  DULoxetine (CYMBALTA) 60 MG capsule Take 60 mg by mouth daily. 11/05/20   [provider]  ferrous sulfate  325 (65 FE) MG tablet Take 1 tablet (325 mg total) by mouth daily with breakfast. 05/09/21   Colin Dawley, MD  furosemide  (LASIX ) 80 MG tablet Take 80 mg by mouth in the morning, at noon, and at bedtime. 09/03/23   [provider]  guaiFENesin -dextromethorphan  (ROBITUSSIN DM) 100-10 MG/5ML syrup Take 10 mLs by mouth every 4 (four) hours as needed for cough. 05/09/21   Colin Dawley, MD  HYDROcodone -acetaminophen  (NORCO/VICODIN) 5-325 MG tablet Take 2 tablets by mouth every 12 (twelve) hours as needed for moderate pain. 05/09/21   Colin Dawley, MD  insulin  aspart (NOVOLOG ) 100 UNIT/ML FlexPen Inject 0-10 Units into the skin 3 (three) times daily with meals. insulin  aspart (novoLOG ) injection 0-10 Units 0-10 Units Subcutaneous, 3 times daily with meals CBG < 70: Implement Hypoglycemia Standing Orders and refer to Hypoglycemia Standing Orders sidebar report  CBG 70 - 120: 0 unit CBG 121 - 150: 0 unit  CBG 151 - 200: 1 unit CBG 201 - 250: 2 units CBG 251 - 300: 4 units CBG 301 - 350: 6 units  CBG 351 - 400: 8 units  CBG > 400: 10 units 05/09/21   Emokpae, Courage, MD  insulin  glargine (LANTUS ) 100 UNIT/ML injection Inject 0.32 mLs (32 Units total) into the skin at bedtime. 05/09/21 06/10/22  Colin Dawley, MD  JARDIANCE 25 MG TABS tablet Take 25 mg by mouth daily. 05/16/23   [provider]  linagliptin  (TRADJENTA ) 5 MG TABS tablet Take 1 tablet (5 mg total) by mouth daily. 05/10/21   Colin Dawley, MD  LORazepam  (ATIVAN ) 0.5 MG tablet Take 1 tablet (0.5 mg total) by mouth at bedtime. 05/09/21   Colin Dawley, MD  magnesium  oxide (MAG-OX) 400 (240 Mg) MG tablet Take 1 tablet by mouth 2 (two) times daily. 08/02/23   [provider]  metolazone  (ZAROXOLYN ) 2.5 MG tablet Take 1 tablet (2.5 mg total) by mouth every Monday, Wednesday, and Friday. 05/11/21   Colin Dawley, MD  metoprolol  succinate (TOPROL -XL) 25 MG 24 hr tablet Take 0.5 tablets by mouth daily. 09/03/23 10/03/23  [provider]  mirtazapine  (REMERON ) 30 MG tablet Take 1 tablet (30 mg total) by mouth at bedtime. 05/09/21   Colin Dawley, MD  multivitamin-lutein (OCUVITE-LUTEIN) CAPS capsule Take 1 capsule by mouth 2 (two) times daily. 02/02/21   [provider]  omeprazole  (PRILOSEC) 20 MG capsule Take 1 capsule (20 mg total) by mouth daily. 05/09/21   Colin Dawley, MD  ondansetron  (ZOFRAN ) 4 MG tablet Take 4 mg by mouth every 4 (four) hours as needed. 06/01/23   [provider]  ondansetron  (ZOFRAN -ODT) 8 MG disintegrating tablet Take 8 mg by mouth every 8 (eight) hours as needed. 07/17/23   [provider]  potassium chloride  SA (KLOR-CON  M) 20 MEQ tablet TAKE 3 TABLETS BY MOUTH TWICE DAILY 10/26/22   McLean, Wisman S, MD  torsemide  (DEMADEX ) 20 MG tablet TAKE 4 TABLETS TWICE DAILY. 06/21/22   [name], Rheta Celestine, MD  TRESIBA FLEXTOUCH 200 UNIT/ML FlexTouch Pen Inject 80 Units into the skin at bedtime. 08/23/23   [provider]  zinc  sulfate 220 (50 Zn) MG capsule Take 1 capsule (220 mg total) by mouth daily. 05/10/21   Colin Dawley, MD      Allergies    Acetaminophen , Codeine, and Tylox [oxycodone -acetaminophen ]    Review of Systems   Review of Systems  Physical Exam Updated Vital Signs BP 132/62   Pulse 97   Temp 97.6 F (36.4 C) (Oral)   Resp 18   SpO2 95%  Physical Exam Vitals and nursing note reviewed.  Constitutional:      General: She is not in  acute distress.    Appearance: She is well-developed.     Comments: And oriented x 2.  Did not know the year.  HENT:     Head: Normocephalic and atraumatic.     Right Ear: External ear normal.     Left Ear: External ear normal.     Nose: Nose normal.  Eyes:     Extraocular Movements: Extraocular movements intact.     Conjunctiva/sclera: Conjunctivae normal.     Pupils: Pupils are equal, round, and reactive to light.  Cardiovascular:     Rate and Rhythm: Normal rate and regular rhythm.     Heart sounds: No murmur heard. Pulmonary:     Effort: Pulmonary effort is normal. No respiratory distress.     Breath  sounds: Normal breath sounds.  Abdominal:     General: Abdomen is flat. There is no distension.     Palpations: Abdomen is soft. There is no mass.     Tenderness: There is no abdominal tenderness. There is no guarding.  Musculoskeletal:     Cervical back: Normal range of motion and neck supple.     Right lower leg: Edema (4+) present.     Left lower leg: Edema (2+) present.     Comments: Dopplerable pulses in both feet.  Full passive range of motion of right lower extremity.  Pain limits range of motion into knee flexion and hip flexion of approximately 30 degrees in the left lower extremity.  Exam somewhat limited due to habitus but no joint effusion or erythema noted of the knee.  Skin:    General: Skin is warm and dry.  Neurological:     Mental Status: She is alert. Mental status is at baseline.     Cranial Nerves: No cranial nerve deficit.     Sensory: No sensory deficit.     Motor: No weakness.  Psychiatric:        Mood and Affect: Mood normal.     ED Results / Procedures / Treatments   Labs (all labs ordered are listed, but only abnormal results are displayed) Labs Reviewed  COMPREHENSIVE METABOLIC PANEL WITH GFR - Abnormal; Notable for the following components:      Result Value   Sodium 132 (*)    Chloride 92 (*)    Glucose, Bld 143 (*)    BUN 74 (*)     Creatinine, Ser 2.02 (*)    Calcium  8.7 (*)    Albumin 2.4 (*)    Alkaline Phosphatase 144 (*)    GFR, Estimated 24 (*)    All other components within normal limits  CBC - Abnormal; Notable for the following components:   Hemoglobin 11.9 (*)    All other components within normal limits  BRAIN NATRIURETIC PEPTIDE - Abnormal; Notable for the following components:   B Natriuretic Peptide 367.0 (*)    All other components within normal limits  URINALYSIS, ROUTINE W REFLEX MICROSCOPIC  CBG MONITORING, ED    EKG None  Radiology DG Chest 2 View Result Date: 09/07/2023 CLINICAL DATA:  Right lower extremity swelling EXAM: CHEST - 2 VIEW COMPARISON:  05/01/2021 FINDINGS: Left-sided pacing device as before. Cardiomegaly with mild central congestion but no overt edema. Possible trace pleural effusions on lateral view. Hardware in the lower spine. IMPRESSION: Cardiomegaly with mild central congestion and possible trace pleural effusions. Electronically Signed   By: Esmeralda Hedge M.D.   On: 09/07/2023 18:26   CT Head Wo Contrast Result Date: 09/07/2023 CLINICAL DATA:  Altered mental status EXAM: CT HEAD WITHOUT CONTRAST TECHNIQUE: Contiguous axial images were obtained from the base of the skull through the vertex without intravenous contrast. RADIATION DOSE REDUCTION: This exam was performed according to the departmental dose-optimization program which includes automated exposure control, adjustment of the mA and/or kV according to patient size and/or use of iterative reconstruction technique. COMPARISON:  CT 01/21/2019, report 09/16/2019 FINDINGS: Brain: No acute territorial infarction, hemorrhage or intracranial mass. Atrophy and moderate chronic small vessel ischemic changes of the white matter. Chronic left PCA infarct with encephalomalacia in the left occipital and medial temporal lobes. Mild ex vacuo dilatation of the posterior horn of left lateral ventricle. Vascular: No hyperdense vessels.  Vertebral and carotid vascular calcification Skull: Normal. Negative for fracture or focal  lesion. Sinuses/Orbits: No acute finding. Other: None IMPRESSION: 1. No CT evidence for acute intracranial abnormality. 2. Atrophy and chronic small vessel ischemic changes of the white matter. Chronic left PCA infarct. Electronically Signed   By: Esmeralda Hedge M.D.   On: 09/07/2023 18:25   US  Venous Img Lower Unilateral Right Result Date: 09/07/2023 CLINICAL DATA:  RIGHT lower extremity pain, swelling, and redness EXAM: RIGHT LOWER EXTREMITY VENOUS DOPPLER ULTRASOUND TECHNIQUE: Gray-scale sonography with compression, as well as color and duplex ultrasound, were performed to evaluate the deep venous system(s) from the level of the common femoral vein through the popliteal and proximal calf veins. COMPARISON:  None available FINDINGS: VENOUS Normal compressibility of the common femoral, superficial femoral, and popliteal veins, as well as the visualized calf veins. Visualized portions of profunda femoral vein and great saphenous vein unremarkable. No filling defects to suggest DVT on grayscale or color Doppler imaging. Doppler waveforms show normal direction of venous flow, normal respiratory plasticity and response to augmentation. Limited views of the contralateral common femoral vein are unremarkable. OTHER None. Limitations: Limited visualization of the calf veins. IMPRESSION: No right lower extremity DVT. Electronically Signed   By: Elester Grim M.D.   On: 09/07/2023 17:41    Procedures Procedures    Medications Ordered in ED Medications  furosemide  (LASIX ) injection 80 mg (has no administration in time range)    ED Course/ Medical Decision Making/ A&P                                 Medical Decision Making Amount and/or Complexity of Data Reviewed Labs: ordered. Radiology: ordered.  Risk Prescription drug management.   Victoria Adams is a 83 y.o. female with comorbidities that complicate the  patient evaluation including CHF on 3 L nasal cannula, neuropathy, atrial fibrillation on Eliquis , and diabetes who presents to the emergency department with leg pain and hallucinations.   Initial Ddx:  Neuropathy, ambulatory dysfunction, delirium, UTI, medication side effect, pneumonia, ICH/stroke  MDM/Course:  Patient presents emergency department with deconditioning and difficulty walking.  Also reports some left leg pain which appears to be chronic for her.  Does have bilateral swelling but right worse than left.  No history of DVT or PE and I do not see any vascular studies to rule out DVT in the right leg.  Does have dopplerable pulses.  No obvious joint effusions or anything that would suggest gout or septic joint currently.  CT head without acute findings.  Her urinalysis did not show evidence of UTI.  Labs show CKD without any evidence of AKI.  BNP also marginally elevated and chest x-ray does show cardiomegaly with some mild pulmonary vascular congestion but patient is on her baseline oxygen requirement at this time.  Her right lower extremity ultrasound did not show evidence of DVT.  Upon re-evaluation patient appears to be at her mental baseline.  Suspect that she may be delirious from the pain and/or going in and out of the hospital.  Do not feel that she requires admission at this time but it feels that the patient is not safe to go home with her family because of her current functional status.  Will have her board in the emergency department and speak to PT and OT.  This patient presents to the ED for concern of complaints listed in HPI, this involves an extensive number of treatment options, and is a complaint that carries with  it a high risk of complications and morbidity. Disposition including potential need for admission considered.   Dispo: TOC boarder  Additional history obtained from daughter Records reviewed Outpatient Clinic Notes The following labs were independently  interpreted: Chemistry and show CKD I independently reviewed the following imaging with scope of interpretation limited to determining acute life threatening conditions related to emergency care: Chest x-ray and agree with the radiologist interpretation with the following exceptions: none I personally reviewed and interpreted cardiac monitoring: normal sinus rhythm  I personally reviewed and interpreted the pt's EKG: see above for interpretation  I have reviewed the patients home medications and made adjustments as needed Consults: TOC Social Determinants of health:  Geriatric  Portions of this note were generated with Scientist, clinical (histocompatibility and immunogenetics). Dictation errors may occur despite best attempts at proofreading.     Final Clinical Impression(s) / ED Diagnoses Final diagnoses:  Confusion  Leg swelling  Ambulatory dysfunction    Rx / DC Orders ED Discharge Orders     None         Ninetta Basket, MD 09/07/23 [phone]

## 2023-09-08 LAB — CBG MONITORING, ED
Glucose-Capillary: 130 mg/dL — ABNORMAL HIGH (ref 70–99)
Glucose-Capillary: 137 mg/dL — ABNORMAL HIGH (ref 70–99)
Glucose-Capillary: 120 mg/dL — ABNORMAL HIGH (ref 70–99)
Glucose-Capillary: 109 mg/dL — ABNORMAL HIGH (ref 70–99)

## 2023-09-08 MED ORDER — METOPROLOL SUCCINATE ER 25 MG PO TB24
12.5000 mg | ORAL_TABLET | Freq: Every day | ORAL | Status: DC
  Administered 2023-09-08 – 2023-09-09 (×2): 12.5 mg via ORAL
  Filled 2023-09-08 (×2): qty 1

## 2023-09-08 MED ORDER — LORAZEPAM 0.5 MG PO TABS
0.5000 mg | ORAL_TABLET | Freq: Every day | ORAL | Status: DC
  Administered 2023-09-08 (×2): 0.5 mg via ORAL
  Filled 2023-09-08 (×2): qty 1

## 2023-09-08 MED ORDER — APIXABAN 5 MG PO TABS
5.0000 mg | ORAL_TABLET | Freq: Two times a day (BID) | ORAL | Status: DC
  Administered 2023-09-08 – 2023-09-09 (×4): 5 mg via ORAL
  Filled 2023-09-08 (×4): qty 1

## 2023-09-08 MED ORDER — ALBUTEROL SULFATE HFA 108 (90 BASE) MCG/ACT IN AERS: 2.0000 | INHALATION_SPRAY | Freq: Four times a day (QID) | RESPIRATORY_TRACT | Status: DC | PRN

## 2023-09-08 MED ORDER — MIRTAZAPINE 15 MG PO TABS
30.0000 mg | ORAL_TABLET | Freq: Every day | ORAL | Status: DC
  Administered 2023-09-08 (×2): 30 mg via ORAL
  Filled 2023-09-08 (×2): qty 2

## 2023-09-08 MED ORDER — DOXYCYCLINE HYCLATE 100 MG PO TABS
100.0000 mg | ORAL_TABLET | Freq: Two times a day (BID) | ORAL | Status: DC
  Administered 2023-09-08 – 2023-09-09 (×4): 100 mg via ORAL
  Filled 2023-09-08 (×4): qty 1

## 2023-09-08 MED ORDER — DULOXETINE HCL 30 MG PO CPEP
60.0000 mg | ORAL_CAPSULE | Freq: Every day | ORAL | Status: DC
  Administered 2023-09-08: 60 mg via ORAL
  Filled 2023-09-08: qty 2

## 2023-09-08 MED ORDER — FUROSEMIDE 40 MG PO TABS
80.0000 mg | ORAL_TABLET | Freq: Every day | ORAL | Status: DC
  Administered 2023-09-08 – 2023-09-09 (×2): 80 mg via ORAL
  Filled 2023-09-08 (×2): qty 2

## 2023-09-08 MED ORDER — ATORVASTATIN CALCIUM 40 MG PO TABS
80.0000 mg | ORAL_TABLET | Freq: Every day | ORAL | Status: DC
  Administered 2023-09-08: 80 mg via ORAL
  Filled 2023-09-08: qty 2

## 2023-09-08 MED ORDER — INSULIN ASPART 100 UNIT/ML IJ SOLN
0.0000 [IU] | Freq: Three times a day (TID) | INTRAMUSCULAR | Status: DC
  Administered 2023-09-08: 2 [IU] via SUBCUTANEOUS
  Filled 2023-09-08: qty 1

## 2023-09-08 MED ORDER — EMPAGLIFLOZIN 25 MG PO TABS
25.0000 mg | ORAL_TABLET | Freq: Every day | ORAL | Status: DC
  Administered 2023-09-08 – 2023-09-09 (×2): 25 mg via ORAL
  Filled 2023-09-08 (×3): qty 1

## 2023-09-08 MED ORDER — FERROUS SULFATE 325 (65 FE) MG PO TABS
325.0000 mg | ORAL_TABLET | Freq: Every day | ORAL | Status: DC
  Administered 2023-09-08 – 2023-09-09 (×2): 325 mg via ORAL
  Filled 2023-09-08 (×2): qty 1

## 2023-09-08 MED ORDER — BUSPIRONE HCL 5 MG PO TABS
7.5000 mg | ORAL_TABLET | Freq: Two times a day (BID) | ORAL | Status: DC
  Administered 2023-09-08 – 2023-09-09 (×4): 7.5 mg via ORAL
  Filled 2023-09-08 (×4): qty 2

## 2023-09-08 MED ORDER — LINAGLIPTIN 5 MG PO TABS
5.0000 mg | ORAL_TABLET | Freq: Every day | ORAL | Status: DC
  Administered 2023-09-08 – 2023-09-09 (×2): 5 mg via ORAL
  Filled 2023-09-08 (×2): qty 1

## 2023-09-08 NOTE — ED Notes (Incomplete)
 Pt is calling out stating "there is someone behind her, saying she hears her daughters voice and her daughter needs to come in the room". Stated "Im ready to go to the hospital" Explained to the pt that she is already in the h

## 2023-09-08 NOTE — Progress Notes (Signed)
 Victoria Adams - [facility] Liaison Note                 We received a referral from this patient's recent hospitalization for enrolment in Home Based Primary Care program as well as outpatient palliative care program of [facility].  She was brought to the ED at [facility] prior to initiation of these services.    Hospital Liaison Team will follow for disposition.   Please reach out if there are questions or concerns.   [name], BSN, RN, [facility]  [phone]

## 2023-09-08 NOTE — Plan of Care (Signed)
  Problem: Acute Rehab PT Goals(only PT should resolve) Goal: Pt Will Go Supine/Side To Sit Outcome: Progressing Flowsheets (Taken 09/08/2023 1324) Pt will go Supine/Side to Sit:  with minimal assist  with contact guard assist Goal: Patient Will Transfer Sit To/From Stand Outcome: Progressing Flowsheets (Taken 09/08/2023 1324) Patient will transfer sit to/from stand:  with minimal assist  with moderate assist Goal: Pt Will Transfer Bed To Chair/Chair To Bed Outcome: Progressing Flowsheets (Taken 09/08/2023 1324) Pt will Transfer Bed to Chair/Chair to Bed:  with min assist  with mod assist Goal: Pt Will Ambulate Outcome: Progressing Flowsheets (Taken 09/08/2023 1324) Pt will Ambulate:  15 feet  with moderate assist  with rolling walker   1:25 PM, 09/08/23 Walton Guppy, MPT Physical Therapist with [facility] [phone] office [phone] mobile phone

## 2023-09-08 NOTE — ED Notes (Signed)
 Pt asking for something to write with. Pt given a red canyon and a piece of paper.

## 2023-09-08 NOTE — ED Notes (Signed)
 Pt assisted to commode in room with 2 person assist. Pt had loose stool

## 2023-09-08 NOTE — Evaluation (Signed)
 Physical Therapy Evaluation Patient Details Name: Victoria Adams MRN: [id number] DOB: October 02, 1940 Today's Date: 09/08/2023  History of Present Illness  Victoria Adams is a 83 y.o. female.     83 year old female history of CHF on 3 L nasal cannula, atrial fibrillation on Eliquis , neuropathy, and diabetes who presents to the emergency department with leg pain and hallucinations.  History obtained per patient's family.  They report that she was recently hospitalized and discharged on 09/03/2023 for heart failure exacerbation as well as cellulitis of her right lower extremity.  Discharged home on Doxy.  Reports that her cellulitis has improved.  Leg swelling is also improved somewhat.  Since being discharged home has essentially been bedbound at her family's house.  Previously was walking with a walker.  Also reportedly is hallucinating and will see people who are not present in the room.  Says that she has been having left lower extremity pain across the entire extremity. (per MD)   Clinical Impression  Patient demonstrates labored movement with good return for using BUE for supine to sitting and scooting to EOB, very unsteady on feet with difficulty taking steps due to c/o BLE weakness and limited to a few side steps during transfer to chair. Patient on room air during functional activities with SpO2 dropping from 94% to 85% and put back on 3 LPM with SpO2 at 92% while sitting in chair. Patient will benefit from continued skilled physical therapy in hospital and recommended venue below to increase strength, balance, endurance for safe ADLs and gait.         If plan is discharge home, recommend the following: A lot of help with walking and/or transfers;A lot of help with bathing/dressing/bathroom;Assistance with cooking/housework;Help with stairs or ramp for entrance   Can travel by private vehicle   No    Equipment Recommendations None recommended by PT  Recommendations for Other Services        Functional Status Assessment Patient has had a recent decline in their functional status and demonstrates the ability to make significant improvements in function in a reasonable and predictable amount of time.     Precautions / Restrictions Precautions Precautions: Fall Recall of Precautions/Restrictions: Impaired Restrictions Weight Bearing Restrictions Per Provider Order: No      Mobility  Bed Mobility Overal bed mobility: Needs Assistance Bed Mobility: Supine to Sit     Supine to sit: HOB elevated, Min assist     General bed mobility comments: increased time, labored movement    Transfers Overall transfer level: Needs assistance Equipment used: Rolling walker (2 wheels) Transfers: Sit to/from Stand, Bed to chair/wheelchair/BSC Sit to Stand: Mod assist   Step pivot transfers: Mod assist, Max assist       General transfer comment: unsteady labored movement    Ambulation/Gait Ambulation/Gait assistance: Mod assist, Max assist Gait Distance (Feet): 3 Feet Assistive device: Rolling walker (2 wheels) Gait Pattern/deviations: Decreased step length - right, Decreased step length - left, Decreased stride length, Narrow base of support, Shuffle Gait velocity: slow     General Gait Details: limited to a few slow labored side steps before having to sit due to c/o fatigue and BLE weakness  Stairs            Wheelchair Mobility     Tilt Bed    Modified Rankin (Stroke Patients Only)       Balance Overall balance assessment: Needs assistance Sitting-balance support: No upper extremity supported, Feet supported Sitting balance-Leahy Scale: Good Sitting balance -  Comments: seated at EOB   Standing balance support: Bilateral upper extremity supported, During functional activity, Reliant on assistive device for balance Standing balance-Leahy Scale: Poor Standing balance comment: using RW                             Pertinent Vitals/Pain Pain  Assessment Pain Assessment: Faces Faces Pain Scale: Hurts little more Pain Location: L stomach/flank pain Pain Descriptors / Indicators: Discomfort, Guarding Pain Intervention(s): Limited activity within patient's tolerance, Monitored during session, Repositioned    Home Living Family/patient expects to be discharged to:: Private residence Living Arrangements: Children Available Help at Discharge: Family;Available 24 hours/day Type of Home: House Home Access: Stairs to enter Entrance Stairs-Rails: Doctor, general practice of Steps: 6   Home Layout: One level Home Equipment: Agricultural consultant (2 wheels);Rollator (4 wheels);BSC/3in1;Cane - single point;Lift chair      Prior Function Prior Level of Function : Needs assist       Physical Assist : Mobility (physical) Mobility (physical): Bed mobility;Transfers;Gait;Stairs   Mobility Comments: Household ambulation using Rollator ADLs Comments: Pt reports PRN assist for ADL's.     Extremity/Trunk Assessment   Upper Extremity Assessment Upper Extremity Assessment: Defer to OT evaluation    Lower Extremity Assessment Lower Extremity Assessment: Generalized weakness    Cervical / Trunk Assessment Cervical / Trunk Assessment: Kyphotic  Communication   Communication Communication: No apparent difficulties    Cognition Arousal: Alert Behavior During Therapy: WFL for tasks assessed/performed                             Following commands: Intact       Cueing Cueing Techniques: Verbal cues, Tactile cues     General Comments      Exercises     Assessment/Plan    PT Assessment Patient needs continued PT services  PT Problem List Decreased strength;Decreased activity tolerance;Decreased balance;Decreased mobility       PT Treatment Interventions DME instruction;Gait training;Stair training;Functional mobility training;Therapeutic activities;Therapeutic exercise;Balance training;Patient/family  education    PT Goals (Current goals can be found in the Care Plan section)  Acute Rehab PT Goals Patient Stated Goal: return home after rehab PT Goal Formulation: With patient Time For Goal Achievement: 09/22/23 Potential to Achieve Goals: Good    Frequency Min 2X/week     Co-evaluation PT/OT/SLP Co-Evaluation/Treatment: Yes Reason for Co-Treatment: To address functional/ADL transfers PT goals addressed during session: Mobility/safety with mobility;Balance;Proper use of DME         AM-PAC PT "6 Clicks" Mobility  Outcome Measure Help needed turning from your back to your side while in a flat bed without using bedrails?: A Little Help needed moving from lying on your back to sitting on the side of a flat bed without using bedrails?: A Little Help needed moving to and from a bed to a chair (including a wheelchair)?: A Lot Help needed standing up from a chair using your arms (e.g., wheelchair or bedside chair)?: A Lot Help needed to walk in hospital room?: A Lot Help needed climbing 3-5 steps with a railing? : Total 6 Click Score: 13    End of Session Equipment Utilized During Treatment: Oxygen Activity Tolerance: Patient tolerated treatment well;Patient limited by fatigue Patient left: in chair;with call bell/phone within reach Nurse Communication: Mobility status PT Visit Diagnosis: Unsteadiness on feet (R26.81);Other abnormalities of gait and mobility (R26.89);Muscle weakness (generalized) (M62.81)  Time: 0801-0827 PT Time Calculation (min) (ACUTE ONLY): 26 min   Charges:   PT Evaluation $PT Eval Moderate Complexity: 1 Mod PT Treatments $Therapeutic Activity: 23-37 mins PT General Charges $$ ACUTE PT VISIT: 1 Visit         1:23 PM, 09/08/23 Walton Guppy, MPT Physical Therapist with [facility] [phone] office [phone] mobile phone

## 2023-09-08 NOTE — ED Provider Notes (Signed)
 CSW spoke to pts daughter to review bed offers, they would like to accept bed offer at [facility] rehab at this time. CSW updated Richad Champagne in admission who states they can likely take pt in the morning. Pt will go to SNF under [facility] SNF waiver, approval email forwarded to [facility] with SNF. TOC to follow.

## 2023-09-08 NOTE — TOC Initial Note (Signed)
 Transition of Care [facility]) - Initial/Assessment Note    Patient Details  Name: Victoria Adams MRN: [id number] Date of Birth: 11-30-1940  Transition of Care [facility]) CM/SW Contact:    Orelia Binet, RN Phone Number: 09/08/2023, 10:15 AM  Clinical Narrative:       Patient brought in to ED with swelling of legs, recently seen at [facility]. PT eval is recommending SNF. CM spoke with her daughter. Patient lives with her son and his family. She walks with a walker. She had been getting weaker and requiring more assistance.  They provide transportation.  They are agreeable to SNF. First choice [facility].  Patient has medicare. TOC is emailing [facility] for a waiver to approve SNF. TOC following team updated.           Expected Discharge Plan: Skilled Nursing Facility Barriers to Discharge: Continued Medical Work up   Patient Goals and CMS Choice Patient states their goals for this hospitalization and ongoing recovery are:: agreeable to SNF CMS Medicare.gov Compare Post Acute Care list provided to:: Patient Represenative (must comment) Choice offered to / list presented to : Adult Children      Expected Discharge Plan and Services     Post Acute Care Choice: Skilled Nursing Facility Living arrangements for the past 2 months: Single Family Home                     Prior Living Arrangements/Services Living arrangements for the past 2 months: Single Family Home Lives with:: Adult Children Patient language and need for interpreter reviewed:: Yes        Need for Family Participation in Patient Care: Yes (Comment) Care giver support system in place?: Yes (comment) Current home services: DME Criminal Activity/Legal Involvement Pertinent to Current Situation/Hospitalization: No - Comment as needed  Activities of Daily Living      Permission Sought/Granted            Permission granted to share info w Relationship: Daughter     Emotional Assessment     Affect (typically observed): Accepting    Alcohol  / Substance Use: Not Applicable Psych Involvement: No (comment)  Admission diagnosis:  Leg pain Patient Active Problem List   Diagnosis Date Noted   Elevated troponin 05/07/2021   Elevated brain natriuretic peptide (BNP) level 05/07/2021   Thrombocytopenia (HCC) 05/07/2021   Hyperglycemia due to diabetes mellitus (HCC) 05/07/2021   Obesity (BMI 30-39.9) 05/07/2021   Acute respiratory failure with hypoxia (HCC) 05/07/2021   Pneumonia due to COVID-19 virus 05/06/2021   C. difficile colitis 01/22/2021   Back pain 02/08/2019   Thoracic compression fracture (HCC) 02/07/2019   Dyspnea 02/07/2019   Right foot pain    Hyponatremia 06/12/2016   Flu-like symptoms 06/12/2016   Acute kidney injury superimposed on CKD (HCC) 06/12/2016   Weakness generalized 06/12/2016   Lactic acid acidosis 06/12/2016   Type 2 diabetes mellitus with complication (HCC)    Falls 09/13/2014   CKD (chronic kidney disease), stage III (HCC) 09/13/2014   Benign neoplasm of colon 12/20/2013   Hyperkalemia 10/03/2013   Orthostatic hypotension 10/03/2013   IDA (iron deficiency anemia) 08/24/2013   NSTEMI (non-ST elevated myocardial infarction) (HCC) 08/22/2013   Long term (current) use of anticoagulants 07/20/2010   NICM (nonischemic cardiomyopathy) (HCC) 10/28/2009   LBBB (left bundle branch block) 10/28/2009   Atrial fibrillation (HCC) 10/28/2009   Hyperlipidemia, unspecified 06/06/2009   Hypokalemia 06/06/2009   Morbid obesity (HCC) 06/06/2009   Essential hypertension, benign 06/06/2009  Atherosclerosis of native coronary artery 06/06/2009   Chronic systolic heart failure (HCC) 06/06/2009   PCP:  Practice, [facility]:   [facility] - [place], [place] - [address] [place] [address] Phone: [phone] Fax: [phone]     Social Drivers of Health (SDOH) Social History: SDOH Screenings   Food Insecurity: Food Insecurity Present (08/31/2023)    Received from [facility]  Transportation Needs: No Transportation Needs (08/31/2023)   Received from [facility]  Utilities: High Risk (08/31/2023)   Received [facility]  Depression (PHQ2-9): Medium Risk (09/01/2020)  Financial Resource Strain: Medium Risk (08/31/2023)   Received from [facility]  Physical Activity: Inactive (08/31/2023)   Received from [facility]  Social Connections: Moderately Isolated (08/31/2023)   Received from [facility]  Stress: Stress Concern Present (08/31/2023)   Received from [facility]  Tobacco Use: Low Risk  (09/07/2023)  Health Literacy: Medium Risk (08/31/2023)   Received from [facility]   SDOH Interventions:

## 2023-09-08 NOTE — Plan of Care (Signed)
  Problem: Acute Rehab OT Goals (only OT should resolve) Goal: Pt. Will Perform Grooming Flowsheets (Taken 09/08/2023 0913) Pt Will Perform Grooming: with modified independence Goal: Pt. Will Perform Lower Body Bathing Flowsheets (Taken 09/08/2023 0913) Pt Will Perform Lower Body Bathing: with contact guard assist Goal: Pt. Will Perform Lower Body Dressing Flowsheets (Taken 09/08/2023 0913) Pt Will Perform Lower Body Dressing: with contact guard assist Goal: Pt. Will Transfer To Toilet Flowsheets (Taken 09/08/2023 0913) Pt Will Transfer to Toilet: with contact guard assist Goal: Pt. Will Perform Toileting-Clothing Manipulation Flowsheets (Taken 09/08/2023 0913) Pt Will Perform Toileting - Clothing Manipulation and hygiene: with supervision Goal: Pt/Caregiver Will Perform Home Exercise Program Flowsheets (Taken 09/08/2023 0913) Pt/caregiver will Perform Home Exercise Program:  Both right and left upper extremity  Increased strength  Independently  Izaak Sahr OT, MOT

## 2023-09-08 NOTE — NC FL2 (Addendum)
 [place]  MEDICAID FL2 LEVEL OF CARE FORM     IDENTIFICATION  Patient Name: Victoria Adams Birthdate: [birth year] Sex: female Admission Date (Current Location): 09/07/2023  [facility] and [place] Number:  [employer] and Address:  [facility],  [address], [place] [address]      Provider Number: [id number]  Attending Physician Name and Address:  System, Provider Not In  Relative Name and Phone Number:       Current Level of Care: Hospital Recommended Level of Care: Skilled Nursing Facility Prior Approval Number:    Date Approved/Denied:   PASRR Number: [id number]  Discharge Plan: SNF    Current Diagnoses: Patient Active Problem List   Diagnosis Date Noted   Elevated troponin 05/07/2021   Elevated brain natriuretic peptide (BNP) level 05/07/2021   Thrombocytopenia (HCC) 05/07/2021   Hyperglycemia due to diabetes mellitus (HCC) 05/07/2021   Obesity (BMI 30-39.9) 05/07/2021   Acute respiratory failure with hypoxia (HCC) 05/07/2021   Pneumonia due to COVID-19 virus 05/06/2021   C. difficile colitis 01/22/2021   Back pain 02/08/2019   Thoracic compression fracture (HCC) 02/07/2019   Dyspnea 02/07/2019   Right foot pain    Hyponatremia 06/12/2016   Flu-like symptoms 06/12/2016   Acute kidney injury superimposed on CKD (HCC) 06/12/2016   Weakness generalized 06/12/2016   Lactic acid acidosis 06/12/2016   Type 2 diabetes mellitus with complication (HCC)    Falls 09/13/2014   CKD (chronic kidney disease), stage III (HCC) 09/13/2014   Benign neoplasm of colon 12/20/2013   Hyperkalemia 10/03/2013   Orthostatic hypotension 10/03/2013   IDA (iron deficiency anemia) 08/24/2013   NSTEMI (non-ST elevated myocardial infarction) (HCC) 08/22/2013   Long term (current) use of anticoagulants 07/20/2010   NICM (nonischemic cardiomyopathy) (HCC) 10/28/2009   LBBB (left bundle branch block) 10/28/2009   Atrial fibrillation (HCC) 10/28/2009    Hyperlipidemia, unspecified 06/06/2009   Hypokalemia 06/06/2009   Morbid obesity (HCC) 06/06/2009   Essential hypertension, benign 06/06/2009   Atherosclerosis of native coronary artery 06/06/2009   Chronic systolic heart failure (HCC) 06/06/2009    Orientation RESPIRATION BLADDER Height & Weight     Self, Place, Time  O2 (3L) Incontinent Weight:   Height:     BEHAVIORAL SYMPTOMS/MOOD NEUROLOGICAL BOWEL NUTRITION STATUS      Continent Diet (Regular)  AMBULATORY STATUS COMMUNICATION OF NEEDS Skin   Extensive Assist Verbally Normal                       Personal Care Assistance Level of Assistance  Bathing, Feeding, Dressing Bathing Assistance: Maximum assistance Feeding assistance: Independent Dressing Assistance: Maximum assistance     Functional Limitations Info  Sight, Hearing, Speech Sight Info: Adequate Hearing Info: Adequate Speech Info: Adequate    SPECIAL CARE FACTORS FREQUENCY  PT (By licensed PT), OT (By licensed OT)     PT Frequency: 5 times weekly OT Frequency: 5 times weekly            Contractures Contractures Info: Not present    Additional Factors Info  Code Status, Allergies Code Status Info: FULL Allergies Info: Acetaminophen , Codeine, Tylox (Oxycodone -acetaminophen )           Current Medications (09/08/2023):  This is the current hospital active medication list Current Facility-Administered Medications  Medication Dose Route Frequency Provider Last Rate Last Admin   albuterol  (VENTOLIN  HFA) 108 (90 Base) MCG/ACT inhaler 2 puff  2 puff Inhalation Q6H PRN Pollina, Marine Sia, MD  apixaban  (ELIQUIS ) tablet 5 mg  5 mg Oral BID Pollina, Christopher J, MD   5 mg at 09/08/23 1004   busPIRone (BUSPAR) tablet 7.5 mg  7.5 mg Oral BID Pollina, Christopher J, MD   7.5 mg at 09/08/23 1003   doxycycline  (VIBRA -TABS) tablet 100 mg  100 mg Oral BID Pollina, Christopher J, MD   100 mg at 09/08/23 1004   empagliflozin (JARDIANCE) tablet 25 mg   25 mg Oral Daily Pollina, Marine Sia, MD       ferrous sulfate  tablet 325 mg  325 mg Oral Q breakfast Pollina, Christopher J, MD   325 mg at 09/08/23 1003   furosemide  (LASIX ) tablet 80 mg  80 mg Oral Daily Pollina, Christopher J, MD   80 mg at 09/08/23 1004   insulin  aspart (novoLOG ) injection 0-15 Units  0-15 Units Subcutaneous TID WC Pollina, Christopher J, MD   2 Units at 09/08/23 1002   linagliptin  (TRADJENTA ) tablet 5 mg  5 mg Oral Daily Pollina, Christopher J, MD   5 mg at 09/08/23 1003   LORazepam  (ATIVAN ) tablet 0.5 mg  0.5 mg Oral QHS Pollina, Christopher J, MD   0.5 mg at 09/08/23 0103   metoprolol  succinate (TOPROL -XL) 24 hr tablet 12.5 mg  12.5 mg Oral Daily Pollina, Christopher J, MD   12.5 mg at 09/08/23 1003   mirtazapine  (REMERON ) tablet 30 mg  30 mg Oral QHS Pollina, Christopher J, MD   30 mg at 09/08/23 0103   Current Outpatient Medications  Medication Sig Dispense Refill   apixaban  (ELIQUIS ) 5 MG TABS tablet Take 1 tablet (5 mg total) by mouth 2 (two) times daily. 60 tablet 3   doxycycline  (VIBRA -TABS) 100 MG tablet Take 100 mg by mouth 2 (two) times daily.     HYDROcodone -acetaminophen  (NORCO/VICODIN) 5-325 MG tablet Take 2 tablets by mouth every 12 (twelve) hours as needed for moderate pain. 10 tablet 0   magnesium  oxide (MAG-OX) 400 (240 Mg) MG tablet Take 1 tablet by mouth 2 (two) times daily.     metoprolol  succinate (TOPROL -XL) 25 MG 24 hr tablet Take 0.5 tablets by mouth daily.     multivitamin-lutein (OCUVITE-LUTEIN) CAPS capsule Take 1 capsule by mouth 2 (two) times daily.     omeprazole  (PRILOSEC) 20 MG capsule Take 1 capsule (20 mg total) by mouth daily. 30 capsule 3   TRESIBA FLEXTOUCH 200 UNIT/ML FlexTouch Pen Inject 10 Units into the skin at bedtime.     acetaminophen  (TYLENOL ) 325 MG tablet Take 650 mg by mouth every 6 (six) hours as needed for mild pain.     albuterol  (VENTOLIN  HFA) 108 (90 Base) MCG/ACT inhaler Inhale 2 puffs into the lungs every 6 (six)  hours as needed for wheezing or shortness of breath. 1 each 2   ascorbic acid  (VITAMIN C) 500 MG tablet Take 1 tablet (500 mg total) by mouth daily. 30 tablet 1   atorvastatin  (LIPITOR ) 80 MG tablet Take 80 mg by mouth daily. (Patient not taking: Reported on 09/08/2023)     busPIRone (BUSPAR) 7.5 MG tablet Take 7.5 mg by mouth 2 (two) times daily.     colchicine  0.6 MG tablet Take 0.6 mg by mouth daily.     dextromethorphan -guaiFENesin  (MUCINEX  DM) 30-600 MG 12hr tablet Take 1 tablet by mouth 2 (two) times daily. 30 tablet 2   DULoxetine (CYMBALTA) 60 MG capsule Take 60 mg by mouth daily. (Patient not taking: Reported on 09/08/2023)     ferrous sulfate  325 (65  FE) MG tablet Take 1 tablet (325 mg total) by mouth daily with breakfast. 30 tablet 3   furosemide  (LASIX ) 80 MG tablet Take 80 mg by mouth in the morning, at noon, and at bedtime. (Patient not taking: Reported on 09/08/2023)     guaiFENesin -dextromethorphan  (ROBITUSSIN DM) 100-10 MG/5ML syrup Take 10 mLs by mouth every 4 (four) hours as needed for cough. 118 mL 0   insulin  aspart (NOVOLOG ) 100 UNIT/ML FlexPen Inject 0-10 Units into the skin 3 (three) times daily with meals. insulin  aspart (novoLOG ) injection 0-10 Units 0-10 Units Subcutaneous, 3 times daily with meals CBG < 70: Implement Hypoglycemia Standing Orders and refer to Hypoglycemia Standing Orders sidebar report  CBG 70 - 120: 0 unit CBG 121 - 150: 0 unit  CBG 151 - 200: 1 unit CBG 201 - 250: 2 units CBG 251 - 300: 4 units CBG 301 - 350: 6 units  CBG 351 - 400: 8 units  CBG > 400: 10 units 15 mL 11   insulin  glargine (LANTUS ) 100 UNIT/ML injection Inject 0.32 mLs (32 Units total) into the skin at bedtime. 10 mL 11   JARDIANCE 25 MG TABS tablet Take 25 mg by mouth daily.     linagliptin  (TRADJENTA ) 5 MG TABS tablet Take 1 tablet (5 mg total) by mouth daily. 30 tablet 5   LORazepam  (ATIVAN ) 0.5 MG tablet Take 1 tablet (0.5 mg total) by mouth at bedtime. 30 tablet 0   metolazone  (ZAROXOLYN )  2.5 MG tablet Take 1 tablet (2.5 mg total) by mouth every Monday, Wednesday, and Friday. 15 tablet 1   mirtazapine  (REMERON ) 30 MG tablet Take 1 tablet (30 mg total) by mouth at bedtime. 30 tablet 3   ondansetron  (ZOFRAN ) 4 MG tablet Take 4 mg by mouth every 4 (four) hours as needed. (Patient not taking: Reported on 09/08/2023)     ondansetron  (ZOFRAN -ODT) 8 MG disintegrating tablet Take 8 mg by mouth every 8 (eight) hours as needed. (Patient not taking: Reported on 09/08/2023)     potassium chloride  SA (KLOR-CON  M) 20 MEQ tablet TAKE 3 TABLETS BY MOUTH TWICE DAILY (Patient not taking: Reported on 09/08/2023) 540 tablet 3   torsemide  (DEMADEX ) 20 MG tablet TAKE 4 TABLETS TWICE DAILY. (Patient not taking: Reported on 09/08/2023) 240 tablet 0   zinc  sulfate 220 (50 Zn) MG capsule Take 1 capsule (220 mg total) by mouth daily. 30 capsule 1     Discharge Medications: Please see after visit summary for a list of discharge medications.  Relevant Imaging Results:  Relevant Lab Results:   Additional Information    Grandville Lax, LCSWA

## 2023-09-08 NOTE — ED Provider Notes (Signed)
  Physical Exam  BP (!) 118/56   Pulse (!) 102   Temp 98 F (36.7 C) (Oral)   Resp 20   SpO2 92%   Physical Exam  Procedures  Procedures  ED Course / MDM    Medical Decision Making Amount and/or Complexity of Data Reviewed Labs: ordered. Radiology: ordered.  Risk Prescription drug management.   Patient now possible skilled nursing placement.  Being faxed out.       Mozell Arias, MD 09/08/23 1026

## 2023-09-08 NOTE — ED Notes (Signed)
Pt resting. No complaints at this time

## 2023-09-08 NOTE — ED Notes (Signed)
 Pt's bed alarm sounded and pt was at edge of stretcher attempting to get OOB. Reoriented to situation and pt remains confused. Moved to room closer to nurses' station with glass doors for closer monitoring. P[place]y alarm remains on, pt repositioned in bed. Appears more comfortable at this time

## 2023-09-08 NOTE — Evaluation (Addendum)
 Occupational Therapy Evaluation Patient Details Name: Victoria Adams MRN: [id number] DOB: [birth year] Today's Date: 09/08/2023   History of Present Illness   [name] is a 83 y.o. female.     83 year old female history of CHF on 3 L nasal cannula, atrial fibrillation on Eliquis , neuropathy, and diabetes who presents to the emergency department with leg pain and hallucinations.  History obtained per patient's family.  They report that she was recently hospitalized and discharged on 09/03/2023 for heart failure exacerbation as well as cellulitis of her right lower extremity.  Discharged home on Doxy.  Reports that her cellulitis has improved.  Leg swelling is also improved somewhat.  Since being discharged home has essentially been bedbound at her family's house.  Previously was walking with a walker.  Also reportedly is hallucinating and will see people who are not present in the room.  Says that she has been having left lower extremity pain across the entire extremity. (per MD)     Clinical Impressions Pt agreeable to OT and PT co-evaluation. Pt reports using rollator at baseline with PRN assist for ADL's. Max A needed for lower body dressing based on inability to doff socks today. B UE generally weak. Min A for bed mobility with HOB raised and mod to max A for step pivot to chair with RW. Pt removed from supplemental O2 initially but had periodic desaturation to ~83% SpO2. Left back on 3 LPM of O2 with saturation above 90%. No family present today. Pt left in the chair with call bell within reach. Pt will benefit from continued OT in the hospital and recommended venue below to increase strength, balance, and endurance for safe ADL's.        If plan is discharge home, recommend the following:   A lot of help with walking and/or transfers;A lot of help with bathing/dressing/bathroom;Assistance with cooking/housework;Assist for transportation;Help with stairs or ramp for entrance;Direct  supervision/assist for medications management     Functional Status Assessment   Patient has had a recent decline in their functional status and demonstrates the ability to make significant improvements in function in a reasonable and predictable amount of time.     Equipment Recommendations   None recommended by OT             Precautions/Restrictions   Precautions Precautions: Fall Recall of Precautions/Restrictions: Impaired Restrictions Weight Bearing Restrictions Per Provider Order: No     Mobility Bed Mobility Overal bed mobility: Needs Assistance Bed Mobility: Supine to Sit     Supine to sit: HOB elevated, Min assist     General bed mobility comments: labored movement; assist to pull to sit    Transfers Overall transfer level: Needs assistance Equipment used: Rolling walker (2 wheels) Transfers: Sit to/from Stand, Bed to chair/wheelchair/BSC Sit to Stand: Mod assist     Step pivot transfers: Mod assist, Max assist     General transfer comment: labored and unsteady steps to chair with RW      Balance Overall balance assessment: Needs assistance Sitting-balance support: No upper extremity supported, Feet supported Sitting balance-Leahy Scale: Good Sitting balance - Comments: seated at EOB   Standing balance support: Bilateral upper extremity supported, During functional activity, Reliant on assistive device for balance Standing balance-Leahy Scale: Poor Standing balance comment: using RW                           ADL either performed or assessed with clinical judgement  ADL Overall ADL's : Needs assistance/impaired Eating/Feeding: Modified independent;Sitting   Grooming: Set up;Sitting   Upper Body Bathing: Set up;Sitting   Lower Body Bathing: Maximal assistance;Sitting/lateral leans   Upper Body Dressing : Set up;Sitting   Lower Body Dressing: Maximal assistance;Sitting/lateral leans   Toilet Transfer: Moderate  assistance;Maximal assistance;Stand-pivot;Rolling walker (2 wheels) Toilet Transfer Details (indicate cue type and reason): simualted via EOB to chair with RW Toileting- Clothing Manipulation and Hygiene: Moderate assistance;Maximal assistance;Sit to/from stand               Vision Baseline Vision/History:  (R eye issues from previous stroke.) Ability to See in Adequate Light: 2 Moderately impaired Patient Visual Report: No change from baseline Vision Assessment?:  (baseline R eye deficits)     Perception Perception: Not tested       Praxis Praxis: Not tested       Pertinent Vitals/Pain Pain Assessment Pain Assessment: Faces Faces Pain Scale: Hurts little more Pain Location: L stomach/flank pain Pain Descriptors / Indicators: Discomfort, Guarding Pain Intervention(s): Monitored during session, Repositioned     Extremity/Trunk Assessment Upper Extremity Assessment Upper Extremity Assessment: Generalized weakness   Lower Extremity Assessment Lower Extremity Assessment: Defer to PT evaluation   Cervical / Trunk Assessment Cervical / Trunk Assessment: Kyphotic   Communication Communication Communication: No apparent difficulties   Cognition Arousal: Alert Behavior During Therapy: WFL for tasks assessed/performed Cognition: No family/caregiver present to determine baseline             OT - Cognition Comments: Pt oreinted to place and president; not oriented to year. Confused about who this therapist was.                 Following commands: Intact       Cueing  General Comments   Cueing Techniques: Verbal cues;Tactile cues                 Home Living Family/patient expects to be discharged to:: Private residence Living Arrangements: Children Available Help at Discharge: Family;Available 24 hours/day Type of Home: House Home Access: Stairs to enter [employer] of Steps: 6 Entrance Stairs-Rails: Right;Left Home Layout: One  level     Bathroom Shower/Tub: Producer, television/film/video: Handicapped height     Home Equipment: Agricultural consultant (2 wheels);Rollator (4 wheels);BSC/3in1;Cane - single point;Lift chair          Prior Functioning/Environment Prior Level of Function : Needs assist       Physical Assist : Mobility (physical) Mobility (physical): Bed mobility;Transfers;Stairs;Gait   Mobility Comments: Bedbound the past 5 days; using rollator prior ADLs Comments: Pt reports PRN assist for ADL's.    OT Problem List: Decreased strength;Decreased activity tolerance;Decreased range of motion;Impaired balance (sitting and/or standing);Decreased cognition;Obesity;Pain   OT Treatment/Interventions: Self-care/ADL training;Therapeutic exercise;DME and/or AE instruction;Therapeutic activities;Cognitive remediation/compensation;Patient/family education;Balance training      OT Goals(Current goals can be found in the care plan section)   Acute Rehab OT Goals Patient Stated Goal: improve function OT Goal Formulation: With patient Time For Goal Achievement: 09/22/23 Potential to Achieve Goals: Good   OT Frequency:  Min 2X/week    Co-evaluation PT/OT/SLP Co-Evaluation/Treatment: Yes Reason for Co-Treatment: To address functional/ADL transfers   OT goals addressed during session: ADL's and self-care      AM-PAC OT "6 Clicks" Daily Activity     Outcome Measure Help from another person eating meals?: None Help from another person taking care of personal grooming?: A Little Help from another person toileting,  which includes using toliet, bedpan, or urinal?: A Lot Help from another person bathing (including washing, rinsing, drying)?: A Lot Help from another person to put on and taking off regular upper body clothing?: A Little Help from another person to put on and taking off regular lower body clothing?: A Lot 6 Click Score: 16   End of Session Equipment Utilized During Treatment: Rolling  walker (2 wheels)  Activity Tolerance: Patient tolerated treatment well Patient left: in chair;with call bell/phone within reach  OT Visit Diagnosis: Unsteadiness on feet (R26.81);Other abnormalities of gait and mobility (R26.89);Muscle weakness (generalized) (M62.81);Other symptoms and signs involving cognitive function                Time: 7829-5621 OT Time Calculation (min): 11 min Charges:  OT General Charges $OT Visit: 1 Visit OT Evaluation $OT Eval Low Complexity: 1 Low  Bryan Omura OT, MOT  Thurnell Floss 09/08/2023, 9:11 AM

## 2023-09-09 DIAGNOSIS — I272 Pulmonary hypertension, unspecified: Secondary | ICD-10-CM | POA: Diagnosis present

## 2023-09-09 DIAGNOSIS — Z20822 Contact with and (suspected) exposure to covid-19: Secondary | ICD-10-CM | POA: Diagnosis not present

## 2023-09-09 DIAGNOSIS — F32A Depression, unspecified: Secondary | ICD-10-CM | POA: Diagnosis present

## 2023-09-09 DIAGNOSIS — I10 Essential (primary) hypertension: Secondary | ICD-10-CM | POA: Diagnosis not present

## 2023-09-09 DIAGNOSIS — Z743 Need for continuous supervision: Secondary | ICD-10-CM | POA: Diagnosis not present

## 2023-09-09 DIAGNOSIS — R54 Age-related physical debility: Secondary | ICD-10-CM | POA: Diagnosis not present

## 2023-09-09 DIAGNOSIS — N184 Chronic kidney disease, stage 4 (severe): Secondary | ICD-10-CM | POA: Diagnosis present

## 2023-09-09 DIAGNOSIS — I5023 Acute on chronic systolic (congestive) heart failure: Secondary | ICD-10-CM | POA: Diagnosis present

## 2023-09-09 DIAGNOSIS — E871 Hypo-osmolality and hyponatremia: Secondary | ICD-10-CM | POA: Diagnosis not present

## 2023-09-09 DIAGNOSIS — M7989 Other specified soft tissue disorders: Secondary | ICD-10-CM | POA: Diagnosis present

## 2023-09-09 DIAGNOSIS — E118 Type 2 diabetes mellitus with unspecified complications: Secondary | ICD-10-CM | POA: Diagnosis present

## 2023-09-09 DIAGNOSIS — I509 Heart failure, unspecified: Secondary | ICD-10-CM | POA: Diagnosis not present

## 2023-09-09 DIAGNOSIS — Z6841 Body Mass Index (BMI) 40.0 and over, adult: Secondary | ICD-10-CM | POA: Diagnosis not present

## 2023-09-09 DIAGNOSIS — R41841 Cognitive communication deficit: Secondary | ICD-10-CM | POA: Diagnosis not present

## 2023-09-09 DIAGNOSIS — R5381 Other malaise: Secondary | ICD-10-CM | POA: Diagnosis not present

## 2023-09-09 DIAGNOSIS — R000 Tachycardia, unspecified: Secondary | ICD-10-CM | POA: Diagnosis not present

## 2023-09-09 DIAGNOSIS — Z1152 Encounter for screening for COVID-19: Secondary | ICD-10-CM | POA: Diagnosis not present

## 2023-09-09 DIAGNOSIS — J690 Pneumonitis due to inhalation of food and vomit: Secondary | ICD-10-CM | POA: Diagnosis present

## 2023-09-09 DIAGNOSIS — D631 Anemia in chronic kidney disease: Secondary | ICD-10-CM | POA: Diagnosis present

## 2023-09-09 DIAGNOSIS — K219 Gastro-esophageal reflux disease without esophagitis: Secondary | ICD-10-CM | POA: Diagnosis present

## 2023-09-09 DIAGNOSIS — F339 Major depressive disorder, recurrent, unspecified: Secondary | ICD-10-CM | POA: Diagnosis not present

## 2023-09-09 DIAGNOSIS — R1111 Vomiting without nausea: Secondary | ICD-10-CM | POA: Diagnosis not present

## 2023-09-09 DIAGNOSIS — R2689 Other abnormalities of gait and mobility: Secondary | ICD-10-CM | POA: Diagnosis not present

## 2023-09-09 DIAGNOSIS — I2510 Atherosclerotic heart disease of native coronary artery without angina pectoris: Secondary | ICD-10-CM | POA: Diagnosis present

## 2023-09-09 DIAGNOSIS — R1084 Generalized abdominal pain: Secondary | ICD-10-CM | POA: Diagnosis not present

## 2023-09-09 DIAGNOSIS — I482 Chronic atrial fibrillation, unspecified: Secondary | ICD-10-CM | POA: Diagnosis present

## 2023-09-09 DIAGNOSIS — M6281 Muscle weakness (generalized): Secondary | ICD-10-CM | POA: Diagnosis not present

## 2023-09-09 DIAGNOSIS — R262 Difficulty in walking, not elsewhere classified: Secondary | ICD-10-CM | POA: Diagnosis not present

## 2023-09-09 DIAGNOSIS — I130 Hypertensive heart and chronic kidney disease with heart failure and stage 1 through stage 4 chronic kidney disease, or unspecified chronic kidney disease: Secondary | ICD-10-CM | POA: Diagnosis present

## 2023-09-09 DIAGNOSIS — N390 Urinary tract infection, site not specified: Secondary | ICD-10-CM | POA: Diagnosis present

## 2023-09-09 DIAGNOSIS — Z515 Encounter for palliative care: Secondary | ICD-10-CM | POA: Diagnosis not present

## 2023-09-09 DIAGNOSIS — F419 Anxiety disorder, unspecified: Secondary | ICD-10-CM | POA: Diagnosis present

## 2023-09-09 DIAGNOSIS — K7460 Unspecified cirrhosis of liver: Secondary | ICD-10-CM | POA: Diagnosis not present

## 2023-09-09 DIAGNOSIS — Z794 Long term (current) use of insulin: Secondary | ICD-10-CM | POA: Diagnosis not present

## 2023-09-09 DIAGNOSIS — R1311 Dysphagia, oral phase: Secondary | ICD-10-CM | POA: Diagnosis not present

## 2023-09-09 DIAGNOSIS — M8580 Other specified disorders of bone density and structure, unspecified site: Secondary | ICD-10-CM | POA: Diagnosis not present

## 2023-09-09 DIAGNOSIS — E441 Mild protein-calorie malnutrition: Secondary | ICD-10-CM | POA: Diagnosis present

## 2023-09-09 DIAGNOSIS — J811 Chronic pulmonary edema: Secondary | ICD-10-CM | POA: Diagnosis not present

## 2023-09-09 DIAGNOSIS — M79605 Pain in left leg: Secondary | ICD-10-CM | POA: Diagnosis not present

## 2023-09-09 DIAGNOSIS — M1A9XX0 Chronic gout, unspecified, without tophus (tophi): Secondary | ICD-10-CM | POA: Diagnosis present

## 2023-09-09 DIAGNOSIS — N183 Chronic kidney disease, stage 3 unspecified: Secondary | ICD-10-CM | POA: Diagnosis present

## 2023-09-09 DIAGNOSIS — M7662 Achilles tendinitis, left leg: Secondary | ICD-10-CM | POA: Diagnosis not present

## 2023-09-09 DIAGNOSIS — L89613 Pressure ulcer of right heel, stage 3: Secondary | ICD-10-CM | POA: Diagnosis present

## 2023-09-09 DIAGNOSIS — G9341 Metabolic encephalopathy: Secondary | ICD-10-CM | POA: Diagnosis not present

## 2023-09-09 DIAGNOSIS — R112 Nausea with vomiting, unspecified: Secondary | ICD-10-CM | POA: Diagnosis not present

## 2023-09-09 DIAGNOSIS — M7732 Calcaneal spur, left foot: Secondary | ICD-10-CM | POA: Diagnosis not present

## 2023-09-09 DIAGNOSIS — A419 Sepsis, unspecified organism: Secondary | ICD-10-CM | POA: Diagnosis not present

## 2023-09-09 DIAGNOSIS — L03115 Cellulitis of right lower limb: Secondary | ICD-10-CM | POA: Diagnosis present

## 2023-09-09 DIAGNOSIS — L89623 Pressure ulcer of left heel, stage 3: Secondary | ICD-10-CM | POA: Diagnosis present

## 2023-09-09 DIAGNOSIS — I4891 Unspecified atrial fibrillation: Secondary | ICD-10-CM | POA: Diagnosis present

## 2023-09-09 DIAGNOSIS — R600 Localized edema: Secondary | ICD-10-CM | POA: Diagnosis not present

## 2023-09-09 DIAGNOSIS — I700 Atherosclerosis of aorta: Secondary | ICD-10-CM | POA: Diagnosis present

## 2023-09-09 DIAGNOSIS — K8689 Other specified diseases of pancreas: Secondary | ICD-10-CM | POA: Diagnosis not present

## 2023-09-09 DIAGNOSIS — R0989 Other specified symptoms and signs involving the circulatory and respiratory systems: Secondary | ICD-10-CM | POA: Diagnosis not present

## 2023-09-09 DIAGNOSIS — R443 Hallucinations, unspecified: Secondary | ICD-10-CM | POA: Diagnosis not present

## 2023-09-09 DIAGNOSIS — R4182 Altered mental status, unspecified: Secondary | ICD-10-CM | POA: Diagnosis not present

## 2023-09-09 DIAGNOSIS — E1122 Type 2 diabetes mellitus with diabetic chronic kidney disease: Secondary | ICD-10-CM | POA: Diagnosis present

## 2023-09-09 DIAGNOSIS — D649 Anemia, unspecified: Secondary | ICD-10-CM | POA: Diagnosis present

## 2023-09-09 DIAGNOSIS — E782 Mixed hyperlipidemia: Secondary | ICD-10-CM | POA: Diagnosis present

## 2023-09-09 DIAGNOSIS — N2889 Other specified disorders of kidney and ureter: Secondary | ICD-10-CM | POA: Diagnosis not present

## 2023-09-09 DIAGNOSIS — R509 Fever, unspecified: Secondary | ICD-10-CM | POA: Diagnosis not present

## 2023-09-09 DIAGNOSIS — M109 Gout, unspecified: Secondary | ICD-10-CM | POA: Diagnosis not present

## 2023-09-09 DIAGNOSIS — G8929 Other chronic pain: Secondary | ICD-10-CM | POA: Diagnosis not present

## 2023-09-09 DIAGNOSIS — A4101 Sepsis due to Methicillin susceptible Staphylococcus aureus: Secondary | ICD-10-CM | POA: Diagnosis present

## 2023-09-09 LAB — BASIC METABOLIC PANEL WITH GFR
Anion gap: 10 (ref 5–15)
BUN: 59 mg/dL — ABNORMAL HIGH (ref 8–23)
CO2: 31 mmol/L (ref 22–32)
Calcium: 8.5 mg/dL — ABNORMAL LOW (ref 8.9–10.3)
Chloride: 98 mmol/L (ref 98–111)
Creatinine, Ser: 1.57 mg/dL — ABNORMAL HIGH (ref 0.44–1.00)
GFR, Estimated: 33 mL/min — ABNORMAL LOW (ref >60)
Glucose, Bld: 112 mg/dL — ABNORMAL HIGH (ref 70–99)
Potassium: 3.8 mmol/L (ref 3.5–5.1)
Sodium: 139 mmol/L (ref 135–145)

## 2023-09-09 LAB — CBG MONITORING, ED
Glucose-Capillary: 98 mg/dL (ref 70–99)
Glucose-Capillary: 103 mg/dL — ABNORMAL HIGH (ref 70–99)

## 2023-09-09 LAB — CBC WITH DIFFERENTIAL/PLATELET
Abs Immature Granulocytes: 0.05 10*3/uL (ref 0.00–0.07)
Basophils Absolute: 0 10*3/uL (ref 0.0–0.1)
Basophils Relative: 0 %
Eosinophils Absolute: 0.1 10*3/uL (ref 0.0–0.5)
Eosinophils Relative: 1 %
HCT: 36.1 % (ref 36.0–46.0)
Hemoglobin: 11.3 g/dL — ABNORMAL LOW (ref 12.0–15.0)
Immature Granulocytes: 1 %
Lymphocytes Relative: 8 %
Lymphs Abs: 0.8 10*3/uL (ref 0.7–4.0)
MCH: 30.1 pg (ref 26.0–34.0)
MCHC: 31.3 g/dL (ref 30.0–36.0)
MCV: 96 fL (ref 80.0–100.0)
Monocytes Absolute: 1.3 10*3/uL — ABNORMAL HIGH (ref 0.1–1.0)
Monocytes Relative: 14 %
Neutro Abs: 6.9 10*3/uL (ref 1.7–7.7)
Neutrophils Relative %: 76 %
Platelets: 242 10*3/uL (ref 150–400)
RBC: 3.76 MIL/uL — ABNORMAL LOW (ref 3.87–5.11)
RDW: 15.3 % (ref 11.5–15.5)
WBC: 9.2 10*3/uL (ref 4.0–10.5)
nRBC: 0 % (ref 0.0–0.2)

## 2023-09-09 MED ORDER — HYDROCODONE-ACETAMINOPHEN 5-325 MG PO TABS: 1.0000 | ORAL_TABLET | Freq: Four times a day (QID) | ORAL | 0 refills | Status: AC | PRN

## 2023-09-09 NOTE — ED Notes (Signed)
 Placed O2 sensor on pt's left great toe. Pt keeps taking off sensor on hand

## 2023-09-09 NOTE — ED Provider Notes (Addendum)
 Emergency Medicine Observation Re-evaluation Note  Victoria Adams is a 83 y.o. female, seen on rounds today.  Pt initially presented to the ED for complaints of Leg Swelling, Leg Pain, and Altered Mental Status Currently, the patient is asleep.  Physical Exam  BP 136/73   Pulse (!) 112   Temp 98.1 F (36.7 C) (Oral)   Resp 17   SpO2 96%  Physical Exam General: asleep Cardiac: HR~100 Lungs: normal effort Psych: asleep  ED Course / MDM  EKG:   I have reviewed the labs performed to date as well as medications administered while in observation.  Recent changes in the last 24 hours include home meds being ordered.  Plan  Current plan is for placement. Creatinine was mildly bumped when checked 2 days ago. Rechecked today, and is now back down to nearly normal for her. Has stable anemia. Medically cleared for placement.    Jerilynn Montenegro, MD 09/09/23 [phone]  10:07 AM Patient accepted to [facility]. Will discharge.   Jerilynn Montenegro, MD 09/09/23 1007

## 2023-09-09 NOTE — ED Notes (Signed)
 1st attempt to call report. No answer at nurses station.

## 2023-09-09 NOTE — ED Notes (Signed)
 Attempted to call both daughters to notify that patient has left [facility] and is being transported to [facility]. Neither daughter answered the phone. The next contact was the pt's sister, Victoria Adams. Victoria Adams received information and confirmed she will ensure all the appropriate family members will be contacted.

## 2023-09-09 NOTE — Discharge Instructions (Addendum)
 If you develop fever, new or worsening confusion, or any other new/concerning symptoms then return to the ER or call 911.  Otherwise, follow up with your primary care physician in about 1 week

## 2023-09-09 NOTE — ED Notes (Signed)
 CSW updated that [facility] is able to accept pt today. MD and RN updated and pt placed up for D/C. CSW spoke with Richad Champagne in admissions who confirms they are ready to accept pt. CSW provided room and report number to RN. CSW updated pts daughter on plan for D/C to SNF today. TOC signing off.

## 2023-09-09 NOTE — ED Notes (Signed)
 Pt has been placed on the convo list to [facility]

## 2023-09-09 NOTE — Progress Notes (Signed)
 Physical Therapy Treatment Patient Details Name: Victoria Adams MRN: [id number] DOB: [birth year] Today's Date: 09/09/2023   History of Present Illness Victoria Adams is a 83 y.o. female.     83 year old female history of CHF on 3 L nasal cannula, atrial fibrillation on Eliquis , neuropathy, and diabetes who presents to the emergency department with leg pain and hallucinations.  History obtained per patient's family.  They report that she was recently hospitalized and discharged on 09/03/2023 for heart failure exacerbation as well as cellulitis of her right lower extremity.  Discharged home on Doxy.  Reports that her cellulitis has improved.  Leg swelling is also improved somewhat.  Since being discharged home has essentially been bedbound at her family's house.  Previously was walking with a walker.  Also reportedly is hallucinating and will see people who are not present in the room.  Says that she has been having left lower extremity pain across the entire extremity. (per MD)    PT Comments  Patient agreeable and motivated for therapy.  Patient demonstrates slow labored movement for sitting up at bedside with difficulty propping up onto elbows due to weakness, fair/good tolerance for completing BLE exercises while seated at bedside and limited to taking a few side steps, steps forward/backwards before having to sit due to fatigue, left hip pain and SOB. Patient tolerated sitting up in chair after therapy. Patient will benefit from continued skilled physical therapy in hospital and recommended venue below to increase strength, balance, endurance for safe ADLs and gait.       If plan is discharge home, recommend the following: A lot of help with walking and/or transfers;A lot of help with bathing/dressing/bathroom;Assistance with cooking/housework;Help with stairs or ramp for entrance   Can travel by private vehicle     No  Equipment Recommendations  None recommended by PT    Recommendations for  Other Services       Precautions / Restrictions Precautions Precautions: Fall Recall of Precautions/Restrictions: Impaired Restrictions Weight Bearing Restrictions Per Provider Order: No     Mobility  Bed Mobility Overal bed mobility: Needs Assistance Bed Mobility: Supine to Sit     Supine to sit: Min assist, Mod assist, HOB elevated     General bed mobility comments: slow labored movement    Transfers Overall transfer level: Needs assistance Equipment used: Rolling walker (2 wheels) Transfers: Sit to/from Stand, Bed to chair/wheelchair/BSC Sit to Stand: Mod assist   Step pivot transfers: Mod assist       General transfer comment: slow labored movment with c/o increasing pain left hip    Ambulation/Gait Ambulation/Gait assistance: Mod assist, Max assist Gait Distance (Feet): 8 Feet Assistive device: Rolling walker (2 wheels) Gait Pattern/deviations: Decreased step length - right, Decreased step length - left, Decreased stance time - left, Decreased stride length, Antalgic Gait velocity: slow     General Gait Details: slightly increased endurance/distance for taking steps at bedside forward/backwards, but limited to increasing left hip paint, fatigue, and SpO2 dropping during exertion   Stairs             Wheelchair Mobility     Tilt Bed    Modified Rankin (Stroke Patients Only)       Balance Overall balance assessment: Needs assistance Sitting-balance support: Feet supported, No upper extremity supported Sitting balance-Leahy Scale: Good Sitting balance - Comments: seated at EOB   Standing balance support: Reliant on assistive device for balance, During functional activity, Bilateral upper extremity supported Standing balance-Leahy Scale: Poor  Standing balance comment: fair/poor using RW                            Communication Communication Communication: No apparent difficulties  Cognition Arousal: Alert Behavior During  Therapy: WFL for tasks assessed/performed   PT - Cognitive impairments: No apparent impairments                         Following commands: Intact      Cueing Cueing Techniques: Verbal cues, Tactile cues  Exercises General Exercises - Lower Extremity Long Arc Quad: AROM, Strengthening, Both, 10 reps, Seated Hip Flexion/Marching: AROM, Strengthening, Both, 10 reps, Seated Toe Raises: AROM, Strengthening, Both, 10 reps, Seated Heel Raises: AROM, Strengthening, Both, 10 reps, Seated    General Comments        Pertinent Vitals/Pain Pain Assessment Pain Assessment: Faces Faces Pain Scale: Hurts even more Pain Location: left hip Pain Descriptors / Indicators: Sore, Grimacing, Discomfort Pain Intervention(s): Limited activity within patient's tolerance, Monitored during session, Repositioned    Home Living                          Prior Function            PT Goals (current goals can now be found in the care plan section) Acute Rehab PT Goals Patient Stated Goal: return home after rehab PT Goal Formulation: With patient Time For Goal Achievement: 09/22/23 Potential to Achieve Goals: Good Progress towards PT goals: Progressing toward goals    Frequency    Min 2X/week      PT Plan      Co-evaluation              AM-PAC PT "6 Clicks" Mobility   Outcome Measure  Help needed turning from your back to your side while in a flat bed without using bedrails?: A Little Help needed moving from lying on your back to sitting on the side of a flat bed without using bedrails?: A Lot Help needed moving to and from a bed to a chair (including a wheelchair)?: A Lot Help needed standing up from a chair using your arms (e.g., wheelchair or bedside chair)?: A Lot Help needed to walk in hospital room?: A Lot Help needed climbing 3-5 steps with a railing? : Total 6 Click Score: 12    End of Session Equipment Utilized During Treatment: Oxygen Activity  Tolerance: Patient tolerated treatment well;Patient limited by fatigue;Patient limited by pain Patient left: in chair;with call bell/phone within reach Nurse Communication: Mobility status PT Visit Diagnosis: Unsteadiness on feet (R26.81);Other abnormalities of gait and mobility (R26.89);Muscle weakness (generalized) (M62.81)     Time: 1610-9604 PT Time Calculation (min) (ACUTE ONLY): 22 min  Charges:    $Therapeutic Exercise: 8-22 mins $Therapeutic Activity: 8-22 mins PT General Charges $$ ACUTE PT VISIT: 1 Visit                     12:32 PM, 09/09/23 Walton Guppy, MPT Physical Therapist with [facility] [phone] office [phone] mobile phone

## 2023-09-09 NOTE — ED Notes (Signed)
 Family, daughter,  updated as to patient's status.

## 2023-09-11 DIAGNOSIS — M109 Gout, unspecified: Secondary | ICD-10-CM | POA: Diagnosis not present

## 2023-09-11 DIAGNOSIS — R5381 Other malaise: Secondary | ICD-10-CM | POA: Diagnosis not present

## 2023-09-11 DIAGNOSIS — I509 Heart failure, unspecified: Secondary | ICD-10-CM | POA: Diagnosis not present

## 2023-09-13 DIAGNOSIS — R262 Difficulty in walking, not elsewhere classified: Secondary | ICD-10-CM | POA: Diagnosis not present

## 2023-09-13 DIAGNOSIS — M109 Gout, unspecified: Secondary | ICD-10-CM | POA: Diagnosis not present

## 2023-09-13 DIAGNOSIS — I509 Heart failure, unspecified: Secondary | ICD-10-CM | POA: Diagnosis not present

## 2023-09-13 DIAGNOSIS — D649 Anemia, unspecified: Secondary | ICD-10-CM | POA: Diagnosis not present

## 2023-09-13 DIAGNOSIS — L03115 Cellulitis of right lower limb: Secondary | ICD-10-CM | POA: Diagnosis not present

## 2023-09-13 DIAGNOSIS — I5023 Acute on chronic systolic (congestive) heart failure: Secondary | ICD-10-CM | POA: Diagnosis not present

## 2023-09-13 DIAGNOSIS — K219 Gastro-esophageal reflux disease without esophagitis: Secondary | ICD-10-CM | POA: Diagnosis not present

## 2023-09-13 DIAGNOSIS — F419 Anxiety disorder, unspecified: Secondary | ICD-10-CM | POA: Diagnosis not present

## 2023-09-13 DIAGNOSIS — I2510 Atherosclerotic heart disease of native coronary artery without angina pectoris: Secondary | ICD-10-CM | POA: Diagnosis not present

## 2023-09-13 DIAGNOSIS — I700 Atherosclerosis of aorta: Secondary | ICD-10-CM | POA: Diagnosis not present

## 2023-09-13 DIAGNOSIS — R5381 Other malaise: Secondary | ICD-10-CM | POA: Diagnosis not present

## 2023-09-14 DIAGNOSIS — F339 Major depressive disorder, recurrent, unspecified: Secondary | ICD-10-CM | POA: Diagnosis not present

## 2023-09-14 DIAGNOSIS — F419 Anxiety disorder, unspecified: Secondary | ICD-10-CM | POA: Diagnosis not present

## 2023-09-14 DIAGNOSIS — M109 Gout, unspecified: Secondary | ICD-10-CM | POA: Diagnosis not present

## 2023-09-14 DIAGNOSIS — I2510 Atherosclerotic heart disease of native coronary artery without angina pectoris: Secondary | ICD-10-CM | POA: Diagnosis not present

## 2023-09-15 NOTE — Addendum Note (Signed)
 Addended by: Lott Rouleau A on: 09/15/2023 02:37 PM   Modules accepted: Orders

## 2023-09-15 NOTE — Progress Notes (Signed)
 Remote ICD transmission.

## 2023-09-21 DIAGNOSIS — F419 Anxiety disorder, unspecified: Secondary | ICD-10-CM | POA: Diagnosis not present

## 2023-09-21 DIAGNOSIS — F339 Major depressive disorder, recurrent, unspecified: Secondary | ICD-10-CM | POA: Diagnosis not present

## 2023-09-27 DIAGNOSIS — I5023 Acute on chronic systolic (congestive) heart failure: Secondary | ICD-10-CM | POA: Diagnosis not present

## 2023-09-27 DIAGNOSIS — R262 Difficulty in walking, not elsewhere classified: Secondary | ICD-10-CM | POA: Diagnosis not present

## 2023-09-27 DIAGNOSIS — G8929 Other chronic pain: Secondary | ICD-10-CM | POA: Diagnosis not present

## 2023-09-27 DIAGNOSIS — R5381 Other malaise: Secondary | ICD-10-CM | POA: Diagnosis not present

## 2023-09-28 DIAGNOSIS — F339 Major depressive disorder, recurrent, unspecified: Secondary | ICD-10-CM | POA: Diagnosis not present

## 2023-09-28 DIAGNOSIS — F419 Anxiety disorder, unspecified: Secondary | ICD-10-CM | POA: Diagnosis not present

## 2023-10-02 DIAGNOSIS — I071 Rheumatic tricuspid insufficiency: Secondary | ICD-10-CM | POA: Diagnosis not present

## 2023-10-02 DIAGNOSIS — I4891 Unspecified atrial fibrillation: Secondary | ICD-10-CM | POA: Diagnosis not present

## 2023-10-02 DIAGNOSIS — M7662 Achilles tendinitis, left leg: Secondary | ICD-10-CM | POA: Diagnosis not present

## 2023-10-02 DIAGNOSIS — K219 Gastro-esophageal reflux disease without esophagitis: Secondary | ICD-10-CM | POA: Diagnosis not present

## 2023-10-02 DIAGNOSIS — L89623 Pressure ulcer of left heel, stage 3: Secondary | ICD-10-CM | POA: Diagnosis not present

## 2023-10-02 DIAGNOSIS — Z515 Encounter for palliative care: Secondary | ICD-10-CM | POA: Diagnosis not present

## 2023-10-02 DIAGNOSIS — D631 Anemia in chronic kidney disease: Secondary | ICD-10-CM | POA: Diagnosis not present

## 2023-10-02 DIAGNOSIS — R54 Age-related physical debility: Secondary | ICD-10-CM | POA: Diagnosis not present

## 2023-10-02 DIAGNOSIS — N2889 Other specified disorders of kidney and ureter: Secondary | ICD-10-CM | POA: Diagnosis not present

## 2023-10-02 DIAGNOSIS — R000 Tachycardia, unspecified: Secondary | ICD-10-CM | POA: Diagnosis not present

## 2023-10-02 DIAGNOSIS — J811 Chronic pulmonary edema: Secondary | ICD-10-CM | POA: Diagnosis not present

## 2023-10-02 DIAGNOSIS — A4101 Sepsis due to Methicillin susceptible Staphylococcus aureus: Secondary | ICD-10-CM | POA: Diagnosis not present

## 2023-10-02 DIAGNOSIS — E1165 Type 2 diabetes mellitus with hyperglycemia: Secondary | ICD-10-CM | POA: Diagnosis not present

## 2023-10-02 DIAGNOSIS — Z20822 Contact with and (suspected) exposure to covid-19: Secondary | ICD-10-CM | POA: Diagnosis not present

## 2023-10-02 DIAGNOSIS — L89613 Pressure ulcer of right heel, stage 3: Secondary | ICD-10-CM | POA: Diagnosis not present

## 2023-10-02 DIAGNOSIS — I2510 Atherosclerotic heart disease of native coronary artery without angina pectoris: Secondary | ICD-10-CM | POA: Diagnosis not present

## 2023-10-02 DIAGNOSIS — E1122 Type 2 diabetes mellitus with diabetic chronic kidney disease: Secondary | ICD-10-CM | POA: Diagnosis not present

## 2023-10-02 DIAGNOSIS — I130 Hypertensive heart and chronic kidney disease with heart failure and stage 1 through stage 4 chronic kidney disease, or unspecified chronic kidney disease: Secondary | ICD-10-CM | POA: Diagnosis not present

## 2023-10-02 DIAGNOSIS — G9340 Encephalopathy, unspecified: Secondary | ICD-10-CM | POA: Diagnosis not present

## 2023-10-02 DIAGNOSIS — R0989 Other specified symptoms and signs involving the circulatory and respiratory systems: Secondary | ICD-10-CM | POA: Diagnosis not present

## 2023-10-02 DIAGNOSIS — K8689 Other specified diseases of pancreas: Secondary | ICD-10-CM | POA: Diagnosis not present

## 2023-10-02 DIAGNOSIS — N390 Urinary tract infection, site not specified: Secondary | ICD-10-CM | POA: Diagnosis not present

## 2023-10-02 DIAGNOSIS — I371 Nonrheumatic pulmonary valve insufficiency: Secondary | ICD-10-CM | POA: Diagnosis not present

## 2023-10-02 DIAGNOSIS — I672 Cerebral atherosclerosis: Secondary | ICD-10-CM | POA: Diagnosis not present

## 2023-10-02 DIAGNOSIS — R1084 Generalized abdominal pain: Secondary | ICD-10-CM | POA: Diagnosis not present

## 2023-10-02 DIAGNOSIS — E782 Mixed hyperlipidemia: Secondary | ICD-10-CM | POA: Diagnosis not present

## 2023-10-02 DIAGNOSIS — F331 Major depressive disorder, recurrent, moderate: Secondary | ICD-10-CM | POA: Diagnosis not present

## 2023-10-02 DIAGNOSIS — R509 Fever, unspecified: Secondary | ICD-10-CM | POA: Diagnosis not present

## 2023-10-02 DIAGNOSIS — R1111 Vomiting without nausea: Secondary | ICD-10-CM | POA: Diagnosis not present

## 2023-10-02 DIAGNOSIS — N184 Chronic kidney disease, stage 4 (severe): Secondary | ICD-10-CM | POA: Diagnosis not present

## 2023-10-02 DIAGNOSIS — Z794 Long term (current) use of insulin: Secondary | ICD-10-CM | POA: Diagnosis not present

## 2023-10-02 DIAGNOSIS — A419 Sepsis, unspecified organism: Secondary | ICD-10-CM | POA: Diagnosis not present

## 2023-10-02 DIAGNOSIS — M8580 Other specified disorders of bone density and structure, unspecified site: Secondary | ICD-10-CM | POA: Diagnosis not present

## 2023-10-02 DIAGNOSIS — J9811 Atelectasis: Secondary | ICD-10-CM | POA: Diagnosis not present

## 2023-10-02 DIAGNOSIS — I351 Nonrheumatic aortic (valve) insufficiency: Secondary | ICD-10-CM | POA: Diagnosis not present

## 2023-10-02 DIAGNOSIS — A412 Sepsis due to unspecified staphylococcus: Secondary | ICD-10-CM | POA: Diagnosis not present

## 2023-10-02 DIAGNOSIS — G9341 Metabolic encephalopathy: Secondary | ICD-10-CM | POA: Diagnosis not present

## 2023-10-02 DIAGNOSIS — I5023 Acute on chronic systolic (congestive) heart failure: Secondary | ICD-10-CM | POA: Diagnosis not present

## 2023-10-02 DIAGNOSIS — Z6841 Body Mass Index (BMI) 40.0 and over, adult: Secondary | ICD-10-CM | POA: Diagnosis not present

## 2023-10-02 DIAGNOSIS — I482 Chronic atrial fibrillation, unspecified: Secondary | ICD-10-CM | POA: Diagnosis not present

## 2023-10-02 DIAGNOSIS — Z1152 Encounter for screening for COVID-19: Secondary | ICD-10-CM | POA: Diagnosis not present

## 2023-10-02 DIAGNOSIS — F32A Depression, unspecified: Secondary | ICD-10-CM | POA: Diagnosis not present

## 2023-10-02 DIAGNOSIS — M7732 Calcaneal spur, left foot: Secondary | ICD-10-CM | POA: Diagnosis not present

## 2023-10-02 DIAGNOSIS — J690 Pneumonitis due to inhalation of food and vomit: Secondary | ICD-10-CM | POA: Diagnosis not present

## 2023-10-02 DIAGNOSIS — R112 Nausea with vomiting, unspecified: Secondary | ICD-10-CM | POA: Diagnosis not present

## 2023-10-02 DIAGNOSIS — E871 Hypo-osmolality and hyponatremia: Secondary | ICD-10-CM | POA: Diagnosis not present

## 2023-10-02 DIAGNOSIS — Z452 Encounter for adjustment and management of vascular access device: Secondary | ICD-10-CM | POA: Diagnosis not present

## 2023-10-02 DIAGNOSIS — I272 Pulmonary hypertension, unspecified: Secondary | ICD-10-CM | POA: Diagnosis not present

## 2023-10-02 DIAGNOSIS — K7460 Unspecified cirrhosis of liver: Secondary | ICD-10-CM | POA: Diagnosis not present

## 2023-10-02 DIAGNOSIS — M1A9XX0 Chronic gout, unspecified, without tophus (tophi): Secondary | ICD-10-CM | POA: Diagnosis not present

## 2023-10-02 DIAGNOSIS — I340 Nonrheumatic mitral (valve) insufficiency: Secondary | ICD-10-CM | POA: Diagnosis not present

## 2023-10-02 DIAGNOSIS — G9389 Other specified disorders of brain: Secondary | ICD-10-CM | POA: Diagnosis not present

## 2023-10-03 DIAGNOSIS — E782 Mixed hyperlipidemia: Secondary | ICD-10-CM | POA: Diagnosis not present

## 2023-10-03 DIAGNOSIS — F331 Major depressive disorder, recurrent, moderate: Secondary | ICD-10-CM | POA: Diagnosis not present

## 2023-10-03 DIAGNOSIS — E1165 Type 2 diabetes mellitus with hyperglycemia: Secondary | ICD-10-CM | POA: Diagnosis not present

## 2023-10-03 DIAGNOSIS — I482 Chronic atrial fibrillation, unspecified: Secondary | ICD-10-CM | POA: Diagnosis not present

## 2023-10-11 DEATH — deceased

## 2023-11-01 ENCOUNTER — Encounter

## 2024-01-31 ENCOUNTER — Encounter

## 2024-05-01 ENCOUNTER — Encounter

## 2024-07-31 ENCOUNTER — Encounter

## 2024-10-30 ENCOUNTER — Encounter

## 2025-01-29 ENCOUNTER — Encounter

## 2025-04-30 ENCOUNTER — Encounter
